# Patient Record
Sex: Female | Born: 1970 | Race: White | Hispanic: No | Marital: Married | Smoking: Never smoker
Health system: Southern US, Community
[De-identification: ages and names within clinical notes are randomized; demographics above are authoritative.]

## PROBLEM LIST (undated history)

## (undated) DIAGNOSIS — M313 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: Secondary | ICD-10-CM

## (undated) DIAGNOSIS — T7840XA Allergy, unspecified, initial encounter: Secondary | ICD-10-CM

## (undated) DIAGNOSIS — N3946 Mixed incontinence: Secondary | ICD-10-CM

## (undated) DIAGNOSIS — N8110 Cystocele, unspecified: Secondary | ICD-10-CM

## (undated) DIAGNOSIS — F329 Major depressive disorder, single episode, unspecified: Secondary | ICD-10-CM

## (undated) DIAGNOSIS — Z973 Presence of spectacles and contact lenses: Secondary | ICD-10-CM

## (undated) DIAGNOSIS — K219 Gastro-esophageal reflux disease without esophagitis: Secondary | ICD-10-CM

## (undated) DIAGNOSIS — C50919 Malignant neoplasm of unspecified site of unspecified female breast: Secondary | ICD-10-CM

## (undated) DIAGNOSIS — J45909 Unspecified asthma, uncomplicated: Secondary | ICD-10-CM

## (undated) DIAGNOSIS — F32A Depression, unspecified: Secondary | ICD-10-CM

## (undated) DIAGNOSIS — K449 Diaphragmatic hernia without obstruction or gangrene: Secondary | ICD-10-CM

## (undated) DIAGNOSIS — Z923 Personal history of irradiation: Secondary | ICD-10-CM

## (undated) DIAGNOSIS — F419 Anxiety disorder, unspecified: Secondary | ICD-10-CM

## (undated) DIAGNOSIS — F411 Generalized anxiety disorder: Secondary | ICD-10-CM

## (undated) HISTORY — DX: 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: M31.30

## (undated) HISTORY — DX: Allergy, unspecified, initial encounter: T78.40XA

## (undated) HISTORY — PX: RHINOPLASTY: SUR1284

## (undated) HISTORY — DX: Unspecified asthma, uncomplicated: J45.909

## (undated) HISTORY — DX: Anxiety disorder, unspecified: F41.9

## (undated) HISTORY — DX: Gastro-esophageal reflux disease without esophagitis: K21.9

## (undated) HISTORY — PX: BREAST LUMPECTOMY: SHX2

## (undated) HISTORY — PX: CARPAL TUNNEL RELEASE: SHX101

---

## 2004-04-14 ENCOUNTER — Emergency Department: Payer: Self-pay | Admitting: Unknown Physician Specialty

## 2004-10-28 ENCOUNTER — Other Ambulatory Visit: Admission: RE | Admit: 2004-10-28 | Discharge: 2004-10-28 | Payer: Self-pay | Admitting: Obstetrics and Gynecology

## 2005-01-15 ENCOUNTER — Emergency Department: Payer: Self-pay | Admitting: Emergency Medicine

## 2005-01-15 ENCOUNTER — Other Ambulatory Visit: Payer: Self-pay

## 2005-04-01 ENCOUNTER — Ambulatory Visit: Payer: Self-pay | Admitting: Internal Medicine

## 2005-08-24 ENCOUNTER — Ambulatory Visit: Payer: Self-pay | Admitting: Internal Medicine

## 2005-10-30 ENCOUNTER — Ambulatory Visit: Payer: Self-pay | Admitting: Internal Medicine

## 2005-12-22 ENCOUNTER — Ambulatory Visit: Payer: Self-pay | Admitting: Family Medicine

## 2006-01-28 ENCOUNTER — Other Ambulatory Visit: Admission: RE | Admit: 2006-01-28 | Discharge: 2006-01-28 | Payer: Self-pay | Admitting: Obstetrics and Gynecology

## 2006-04-26 ENCOUNTER — Ambulatory Visit: Payer: Self-pay | Admitting: Internal Medicine

## 2006-05-11 ENCOUNTER — Ambulatory Visit: Payer: Self-pay

## 2006-05-31 ENCOUNTER — Ambulatory Visit: Payer: Self-pay | Admitting: Internal Medicine

## 2006-09-13 ENCOUNTER — Encounter: Payer: Self-pay | Admitting: Internal Medicine

## 2006-09-13 ENCOUNTER — Ambulatory Visit: Payer: Self-pay | Admitting: Family Medicine

## 2006-09-13 LAB — CONVERTED CEMR LAB
Basophils Absolute: 0.1 10*3/uL (ref 0.0–0.1)
Basophils Relative: 0.5 % (ref 0.0–1.0)
Eosinophils Absolute: 0.1 10*3/uL (ref 0.0–0.6)
Eosinophils Relative: 1.2 % (ref 0.0–5.0)
HCT: 37.1 % (ref 36.0–46.0)
Hemoglobin: 12.9 g/dL (ref 12.0–15.0)
Lymphocytes Relative: 7.5 % — ABNORMAL LOW (ref 12.0–46.0)
MCHC: 34.8 g/dL (ref 30.0–36.0)
MCV: 92.9 fL (ref 78.0–100.0)
Monocytes Absolute: 0.7 10*3/uL (ref 0.2–0.7)
Monocytes Relative: 5.8 % (ref 3.0–11.0)
Neutro Abs: 10.2 10*3/uL — ABNORMAL HIGH (ref 1.4–7.7)
Neutrophils Relative %: 85 % — ABNORMAL HIGH (ref 43.0–77.0)
Platelets: 241 10*3/uL (ref 150–400)
RBC: 3.99 M/uL (ref 3.87–5.11)
RDW: 12.6 % (ref 11.5–14.6)
WBC: 12 10*3/uL — ABNORMAL HIGH (ref 4.5–10.5)

## 2006-09-14 ENCOUNTER — Emergency Department: Payer: Self-pay | Admitting: Emergency Medicine

## 2006-10-12 DIAGNOSIS — J45909 Unspecified asthma, uncomplicated: Secondary | ICD-10-CM | POA: Insufficient documentation

## 2006-10-12 DIAGNOSIS — J309 Allergic rhinitis, unspecified: Secondary | ICD-10-CM | POA: Insufficient documentation

## 2006-10-12 DIAGNOSIS — K219 Gastro-esophageal reflux disease without esophagitis: Secondary | ICD-10-CM | POA: Insufficient documentation

## 2006-10-13 ENCOUNTER — Ambulatory Visit: Payer: Self-pay | Admitting: Unknown Physician Specialty

## 2006-11-02 ENCOUNTER — Encounter: Payer: Self-pay | Admitting: Internal Medicine

## 2007-01-19 ENCOUNTER — Encounter (INDEPENDENT_AMBULATORY_CARE_PROVIDER_SITE_OTHER): Payer: Self-pay | Admitting: *Deleted

## 2007-01-24 ENCOUNTER — Encounter (INDEPENDENT_AMBULATORY_CARE_PROVIDER_SITE_OTHER): Payer: Self-pay | Admitting: *Deleted

## 2007-07-07 HISTORY — PX: BREAST SURGERY: SHX581

## 2008-01-09 ENCOUNTER — Ambulatory Visit: Payer: Self-pay | Admitting: Family Medicine

## 2008-01-21 ENCOUNTER — Ambulatory Visit: Payer: Self-pay | Admitting: Unknown Physician Specialty

## 2008-07-06 HISTORY — PX: AUGMENTATION MAMMAPLASTY: SUR837

## 2008-07-06 HISTORY — PX: ABDOMINAL HYSTERECTOMY: SHX81

## 2009-06-24 ENCOUNTER — Emergency Department: Payer: Self-pay | Admitting: Emergency Medicine

## 2010-02-21 ENCOUNTER — Ambulatory Visit (HOSPITAL_COMMUNITY): Admission: RE | Admit: 2010-02-21 | Discharge: 2010-02-22 | Payer: Self-pay | Admitting: Obstetrics and Gynecology

## 2010-02-21 ENCOUNTER — Encounter (INDEPENDENT_AMBULATORY_CARE_PROVIDER_SITE_OTHER): Payer: Self-pay | Admitting: Obstetrics and Gynecology

## 2010-08-05 NOTE — Letter (Signed)
Summary: [employer] No Show Letter  [place] at [facility]  [address] [place], [place] [address]   Phone: [phone]  Fax: [phone]    01/24/2007   Dear Ms. Azucena Kuba,   Our records indicate that you missed your scheduled appointment with _Dr. Ketvak____________________ on __7.21.2008__________.  Please contact this office to reschedule your appointment as soon as possible.  It is important that you keep your scheduled appointments with your physician, so we can provide you the best care possible.  Please be advised that there may be a charge for "no show" appointments.    Sincerely,   [place] at [facility]

## 2010-08-05 NOTE — Miscellaneous (Signed)
Summary: Appointment Canceled  Appointment status changed to canceled by [employer] on 11/02/2006 1:08 PM.  Cancellation Comments --------------------- CPX/EVE  Appointment Information ----------------------- Appt Type:  ADULT PHYSICAL      Date:  Tuesday, November 02, 2006      Time:  8:30 AM for 30 min   Urgency:  Routine   Made By:  Pearson Grippe  To Visit:  [id number]    Reason:  CPX/EVE  Appt Comments ------------- -- 11/02/06 13:08: (CEMR) CANCELED -- CPX/EVE -- 11/02/06 9:00: (CEMR) NO SHOW -- CPX/EVE -- 10/27/06 10:58: (CEMR) BOOKED -- Routine ADULT PHYSICAL at 11/02/2006 8:30 AM for 30 min CPX/EVE

## 2010-08-05 NOTE — Miscellaneous (Signed)
  Medications Added CITALOPRAM HYDROBROMIDE 40 MG TABS (CITALOPRAM HYDROBROMIDE)  CELEXA 40 MG  TABS (CITALOPRAM HYDROBROMIDE) 60 mg. once daily PRILOSEC 10 MG  CPDR (OMEPRAZOLE) OTC once daily ALLEGRA 180 MG  TABS (FEXOFENADINE HCL) once daily as needed      Allergies Added: ! MACRODANTIN (NITROFURANTOIN MACROCRYSTAL) ! ERY-TAB (ERYTHROMYCIN BASE) Clinical Lists Changes  Medications: Added new medication of CITALOPRAM HYDROBROMIDE 40 MG TABS (CITALOPRAM HYDROBROMIDE) Added new medication of CELEXA 40 MG  TABS (CITALOPRAM HYDROBROMIDE) 60 mg. once daily Added new medication of PRILOSEC 10 MG  CPDR (OMEPRAZOLE) OTC once daily Added new medication of ALLEGRA 180 MG  TABS (FEXOFENADINE HCL) once daily as needed Allergies: Added new allergy or adverse reaction of MACRODANTIN (NITROFURANTOIN MACROCRYSTAL) Added new allergy or adverse reaction of ERY-TAB (ERYTHROMYCIN BASE) Observations: Added new observation of NKA: F (01/19/2007 13:55) Added new observation of LLIMPORTALLS: completed (01/19/2007 13:55) Added new observation of LLIMPORTMEDS: completed (01/19/2007 13:55)

## 2010-09-19 LAB — CBC
HCT: 29.5 % — ABNORMAL LOW (ref 36.0–46.0)
HCT: 35.4 % — ABNORMAL LOW (ref 36.0–46.0)
Hemoglobin: 10.1 g/dL — ABNORMAL LOW (ref 12.0–15.0)
Hemoglobin: 12 g/dL (ref 12.0–15.0)
MCH: 30.2 pg (ref 26.0–34.0)
MCH: 30.7 pg (ref 26.0–34.0)
MCHC: 33.9 g/dL (ref 30.0–36.0)
MCHC: 34.2 g/dL (ref 30.0–36.0)
MCV: 89.2 fL (ref 78.0–100.0)
MCV: 89.9 fL (ref 78.0–100.0)
Platelets: 205 10*3/uL (ref 150–400)
Platelets: 226 10*3/uL (ref 150–400)
RBC: 3.28 MIL/uL — ABNORMAL LOW (ref 3.87–5.11)
RBC: 3.97 MIL/uL (ref 3.87–5.11)
RDW: 17.7 % — ABNORMAL HIGH (ref 11.5–15.5)
RDW: 17.7 % — ABNORMAL HIGH (ref 11.5–15.5)
WBC: 11.4 10*3/uL — ABNORMAL HIGH (ref 4.0–10.5)
WBC: 6.2 10*3/uL (ref 4.0–10.5)

## 2010-09-19 LAB — SURGICAL PCR SCREEN
MRSA, PCR: NEGATIVE
Staphylococcus aureus: NEGATIVE

## 2010-09-19 LAB — PREGNANCY, URINE: Preg Test, Ur: NEGATIVE

## 2011-07-28 ENCOUNTER — Ambulatory Visit: Payer: Self-pay | Admitting: Otolaryngology

## 2011-12-01 ENCOUNTER — Other Ambulatory Visit: Payer: Self-pay | Admitting: Obstetrics and Gynecology

## 2011-12-01 DIAGNOSIS — Z1231 Encounter for screening mammogram for malignant neoplasm of breast: Secondary | ICD-10-CM

## 2012-02-17 ENCOUNTER — Ambulatory Visit
Admission: RE | Admit: 2012-02-17 | Discharge: 2012-02-17 | Disposition: A | Discharge status: Home / Self Care | Payer: 59 | Source: Ambulatory Visit | Attending: Obstetrics and Gynecology | Admitting: Obstetrics and Gynecology

## 2012-02-17 ENCOUNTER — Ambulatory Visit: Payer: Self-pay | Admitting: Family Medicine

## 2012-02-17 DIAGNOSIS — Z1231 Encounter for screening mammogram for malignant neoplasm of breast: Secondary | ICD-10-CM

## 2013-10-05 ENCOUNTER — Ambulatory Visit: Payer: Self-pay | Admitting: Otolaryngology

## 2013-12-05 DIAGNOSIS — J3489 Other specified disorders of nose and nasal sinuses: Secondary | ICD-10-CM | POA: Insufficient documentation

## 2013-12-05 DIAGNOSIS — L309 Dermatitis, unspecified: Secondary | ICD-10-CM | POA: Insufficient documentation

## 2013-12-06 DIAGNOSIS — H905 Unspecified sensorineural hearing loss: Secondary | ICD-10-CM | POA: Insufficient documentation

## 2013-12-18 ENCOUNTER — Ambulatory Visit (INDEPENDENT_AMBULATORY_CARE_PROVIDER_SITE_OTHER): Payer: BC Managed Care – PPO | Admitting: Family Medicine

## 2013-12-18 VITALS — BP 138/92 | HR 96 | Temp 97.5°F | Resp 16 | Ht 63.75 in | Wt 175.8 lb

## 2013-12-18 DIAGNOSIS — IMO0001 Reserved for inherently not codable concepts without codable children: Secondary | ICD-10-CM

## 2013-12-18 DIAGNOSIS — F438 Other reactions to severe stress: Secondary | ICD-10-CM

## 2013-12-18 DIAGNOSIS — Z79899 Other long term (current) drug therapy: Secondary | ICD-10-CM

## 2013-12-18 DIAGNOSIS — R030 Elevated blood-pressure reading, without diagnosis of hypertension: Secondary | ICD-10-CM

## 2013-12-18 DIAGNOSIS — F4389 Other reactions to severe stress: Secondary | ICD-10-CM

## 2013-12-18 DIAGNOSIS — F430 Acute stress reaction: Secondary | ICD-10-CM

## 2013-12-18 DIAGNOSIS — M313 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: Secondary | ICD-10-CM

## 2013-12-18 LAB — POCT CBC
Granulocyte percent: 63 %G (ref 37–80)
HCT, POC: 39.3 % (ref 37.7–47.9)
Hemoglobin: 12.6 g/dL (ref 12.2–16.2)
Lymph, poc: 2.3 (ref 0.6–3.4)
MCH, POC: 29.8 pg (ref 27–31.2)
MCHC: 32.1 g/dL (ref 31.8–35.4)
MCV: 92.9 fL (ref 80–97)
MID (cbc): 0.4 (ref 0–0.9)
MPV: 8.4 fL (ref 0–99.8)
POC Granulocyte: 4.7 (ref 2–6.9)
POC LYMPH PERCENT: 31.4 %L (ref 10–50)
POC MID %: 5.6 %M (ref 0–12)
Platelet Count, POC: 299 10*3/uL (ref 142–424)
RBC: 4.23 M/uL (ref 4.04–5.48)
RDW, POC: 15.3 %
WBC: 7.4 10*3/uL (ref 4.6–10.2)

## 2013-12-18 LAB — POCT URINALYSIS DIPSTICK
Bilirubin, UA: NEGATIVE
Blood, UA: NEGATIVE
Glucose, UA: NEGATIVE
Ketones, UA: NEGATIVE
Leukocytes, UA: NEGATIVE
Nitrite, UA: NEGATIVE
Protein, UA: NEGATIVE
Spec Grav, UA: 1.01
Urobilinogen, UA: 0.2
pH, UA: 5.5

## 2013-12-18 LAB — POCT UA - MICROSCOPIC ONLY
Bacteria, U Microscopic: NEGATIVE
Casts, Ur, LPF, POC: NEGATIVE
Crystals, Ur, HPF, POC: NEGATIVE
Mucus, UA: NEGATIVE
RBC, urine, microscopic: NEGATIVE
WBC, Ur, HPF, POC: NEGATIVE
Yeast, UA: NEGATIVE

## 2013-12-18 LAB — POCT SEDIMENTATION RATE: POCT SED RATE: 19 mm/hr (ref 0–22)

## 2013-12-18 MED ORDER — HYDROCHLOROTHIAZIDE 25 MG PO TABS: 25.0000 mg | ORAL_TABLET | Freq: Every day | ORAL | Status: DC

## 2013-12-18 NOTE — Patient Instructions (Signed)
Managing Your High Blood Pressure Blood pressure is a measurement of how forceful your blood is pressing against the walls of the arteries. Arteries are muscular tubes within the circulatory system. Blood pressure does not stay the same. Blood pressure rises when you are active, excited, or nervous; and it lowers during sleep and relaxation. If the numbers measuring your blood pressure stay above normal most of the time, you are at risk for health problems. High blood pressure (hypertension) is a long-term (chronic) condition in which blood pressure is elevated. A blood pressure reading is recorded as two numbers, such as 120 over 80 (or 120/80). The first, higher number is called the systolic pressure. It is a measure of the pressure in your arteries as the heart beats. The second, lower number is called the diastolic pressure. It is a measure of the pressure in your arteries as the heart relaxes between beats.  Keeping your blood pressure in a normal range is important to your overall health and prevention of health problems, such as heart disease and stroke. When your blood pressure is uncontrolled, your heart has to work harder than normal. High blood pressure is a very common condition in adults because blood pressure tends to rise with age. Men and women are equally likely to have hypertension but at different times in life. Before age 45, men are more likely to have hypertension. After 44 years of age, women are more likely to have it. Hypertension is especially common in African Americans. This condition often has no signs or symptoms. The cause of the condition is usually not known. Your caregiver can help you come up with a plan to keep your blood pressure in a normal, healthy range. BLOOD PRESSURE STAGES Blood pressure is classified into four stages: normal, prehypertension, stage 1, and stage 2. Your blood pressure reading will be used to determine what type of treatment, if any, is necessary.  Appropriate treatment options are tied to these four stages:  Normal  Systolic pressure (mm Hg): below 120.  Diastolic pressure (mm Hg): below 80. Prehypertension  Systolic pressure (mm Hg): 120 to 139.  Diastolic pressure (mm Hg): 80 to 89. Stage1  Systolic pressure (mm Hg): 140 to 159.  Diastolic pressure (mm Hg): 90 to 99. Stage2  Systolic pressure (mm Hg): 160 or above.  Diastolic pressure (mm Hg): 100 or above. RISKS RELATED TO HIGH BLOOD PRESSURE Managing your blood pressure is an important responsibility. Uncontrolled high blood pressure can lead to:  A heart attack.  A stroke.  A weakened blood vessel (aneurysm).  Heart failure.  Kidney damage.  Eye damage.  Metabolic syndrome.  Memory and concentration problems. HOW TO MANAGE YOUR BLOOD PRESSURE Blood pressure can be managed effectively with lifestyle changes and medicines (if needed). Your caregiver will help you come up with a plan to bring your blood pressure within a normal range. Your plan should include the following: Education  Read all information provided by your caregivers about how to control blood pressure.  Educate yourself on the latest guidelines and treatment recommendations. New research is always being done to further define the risks and treatments for high blood pressure. Lifestylechanges  Control your weight.  Avoid smoking.  Stay physically active.  Reduce the amount of salt in your diet.  Reduce stress.  Control any chronic conditions, such as high cholesterol or diabetes.  Reduce your alcohol intake. Medicines  Several medicines (antihypertensive medicines) are available, if needed, to bring blood pressure within a normal range.  Communication  Review all the medicines you take with your caregiver because there may be side effects or interactions.  Talk with your caregiver about your diet, exercise habits, and other lifestyle factors that may be contributing to  high blood pressure.  See your caregiver regularly. Your caregiver can help you create and adjust your plan for managing high blood pressure. RECOMMENDATIONS FOR TREATMENT AND FOLLOW-UP  The following recommendations are based on current guidelines for managing high blood pressure in nonpregnant adults. Use these recommendations to identify the proper follow-up period or treatment option based on your blood pressure reading. You can discuss these options with your caregiver.  Systolic pressure of 378 to 588 or diastolic pressure of 80 to 89: Follow up with your caregiver as directed.  Systolic pressure of 502 to 774 or diastolic pressure of 90 to 100: Follow up with your caregiver within 2 months.  Systolic pressure above 128 or diastolic pressure above 786: Follow up with your caregiver within 1 month.  Systolic pressure above 767 or diastolic pressure above 209: Consider antihypertensive therapy; follow up with your caregiver within 1 week.  Systolic pressure above 470 or diastolic pressure above 962: Begin antihypertensive therapy; follow up with your caregiver within 1 week. Document Released: 03/16/2012 Document Reviewed: 03/16/2012 [facility] Patient Information 2014 [place], [place]. Arterial Hypertension Arterial hypertension (high blood pressure) is a condition of elevated pressure in your blood vessels. Hypertension over a long period of time is a risk factor for strokes, heart attacks, and heart failure. It is also the leading cause of kidney (renal) failure.  CAUSES   In Adults -- Over 90% of all hypertension has no known cause. This is called essential or primary hypertension. In the other 10% of people with hypertension, the increase in blood pressure is caused by another disorder. This is called secondary hypertension. Important causes of secondary hypertension are:  Heavy alcohol use.  Obstructive sleep apnea.  Hyperaldosterosim (Conn's syndrome).  Steroid use.  Chronic  kidney failure.  Hyperparathyroidism.  Medications.  Renal artery stenosis.  Pheochromocytoma.  Cushing's disease.  Coarctation of the aorta.  Scleroderma renal crisis.  Licorice (in excessive amounts).  Drugs (cocaine, methamphetamine). Your caregiver can explain any items above that apply to you.  In Children -- Secondary hypertension is more common and should always be considered.  Pregnancy -- Few women of childbearing age have high blood pressure. However, up to 10% of them develop hypertension of pregnancy. Generally, this will not harm the woman. It may be a sign of 3 complications of pregnancy: preeclampsia, HELLP syndrome, and eclampsia. Follow up and control with medication is necessary. SYMPTOMS   This condition normally does not produce any noticeable symptoms. It is usually found during a routine exam.  Malignant hypertension is a late problem of high blood pressure. It may have the following symptoms:  Headaches.  Blurred vision.  End-organ damage (this means your kidneys, heart, lungs, and other organs are being damaged).  Stressful situations can increase the blood pressure. If a person with normal blood pressure has their blood pressure go up while being seen by their caregiver, this is often termed "white coat hypertension." Its importance is not known. It may be related with eventually developing hypertension or complications of hypertension.  Hypertension is often confused with mental tension, stress, and anxiety. DIAGNOSIS  The diagnosis is made by 3 separate blood pressure measurements. They are taken at least 1 week apart from each other. If there is organ damage from hypertension, the  diagnosis may be made without repeat measurements. Hypertension is usually identified by having blood pressure readings:  Above 140/90 mmHg measured in both arms, at 3 separate times, over a couple weeks.  Over 130/80 mmHg should be considered a risk factor and may  require treatment in patients with diabetes. Blood pressure readings over 120/80 mmHg are called "pre-hypertension" even in non-diabetic patients. To get a true blood pressure measurement, use the following guidelines. Be aware of the factors that can alter blood pressure readings.  Take measurements at least 1 hour after caffeine.  Take measurements 30 minutes after smoking and without any stress. This is another reason to quit smoking  it raises your blood pressure.  Use a proper cuff size. Ask your caregiver if you are not sure about your cuff size.  Most home blood pressure cuffs are automatic. They will measure systolic and diastolic pressures. The systolic pressure is the pressure reading at the start of sounds. Diastolic pressure is the pressure at which the sounds disappear. If you are elderly, measure pressures in multiple postures. Try sitting, lying or standing.  Sit at rest for a minimum of 5 minutes before taking measurements.  You should not be on any medications like decongestants. These are found in many cold medications.  Record your blood pressure readings and review them with your caregiver. If you have hypertension:  Your caregiver may do tests to be sure you do not have secondary hypertension (see "causes" above).  Your caregiver may also look for signs of metabolic syndrome. This is also called Syndrome X or Insulin Resistance Syndrome. You may have this syndrome if you have type 2 diabetes, abdominal obesity, and abnormal blood lipids in addition to hypertension.  Your caregiver will take your medical and family history and perform a physical exam.  Diagnostic tests may include blood tests (for glucose, cholesterol, potassium, and kidney function), a urinalysis, or an EKG. Other tests may also be necessary depending on your condition. PREVENTION  There are important lifestyle issues that you can adopt to reduce your chance of developing hypertension:  Maintain a  normal weight.  Limit the amount of salt (sodium) in your diet.  Exercise often.  Limit alcohol intake.  Get enough potassium in your diet. Discuss specific advice with your caregiver.  Follow a DASH diet (dietary approaches to stop hypertension). This diet is rich in fruits, vegetables, and low-fat dairy products, and avoids certain fats. PROGNOSIS  Essential hypertension cannot be cured. Lifestyle changes and medical treatment can lower blood pressure and reduce complications. The prognosis of secondary hypertension depends on the underlying cause. Many people whose hypertension is controlled with medicine or lifestyle changes can live a normal, healthy life.  RISKS AND COMPLICATIONS  While high blood pressure alone is not an illness, it often requires treatment due to its short- and long-term effects on many organs. Hypertension increases your risk for:  CVAs or strokes (cerebrovascular accident).  Heart failure due to chronically high blood pressure (hypertensive cardiomyopathy).  Heart attack (myocardial infarction).  Damage to the retina (hypertensive retinopathy).  Kidney failure (hypertensive nephropathy). Your caregiver can explain list items above that apply to you. Treatment of hypertension can significantly reduce the risk of complications. TREATMENT   For overweight patients, weight loss and regular exercise are recommended. Physical fitness lowers blood pressure.  Mild hypertension is usually treated with diet and exercise. A diet rich in fruits and vegetables, fat-free dairy products, and foods low in fat and salt (sodium) can help lower  blood pressure. Decreasing salt intake decreases blood pressure in a 1/3 of people.  Stop smoking if you are a smoker. The steps above are highly effective in reducing blood pressure. While these actions are easy to suggest, they are difficult to achieve. Most patients with moderate or severe hypertension end up requiring medications  to bring their blood pressure down to a normal level. There are several classes of medications for treatment. Blood pressure pills (antihypertensives) will lower blood pressure by their different actions. Lowering the blood pressure by 10 mmHg may decrease the risk of complications by as much as 25%. The goal of treatment is effective blood pressure control. This will reduce your risk for complications. Your caregiver will help you determine the best treatment for you according to your lifestyle. What is excellent treatment for one person, may not be for you. HOME CARE INSTRUCTIONS   Do not smoke.  Follow the lifestyle changes outlined in the "Prevention" section.  If you are on medications, follow the directions carefully. Blood pressure medications must be taken as prescribed. Skipping doses reduces their benefit. It also puts you at risk for problems.  Follow up with your caregiver, as directed.  If you are asked to monitor your blood pressure at home, follow the guidelines in the "Diagnosis" section above. SEEK MEDICAL CARE IF:   You think you are having medication side effects.  You have recurrent headaches or lightheadedness.  You have swelling in your ankles.  You have trouble with your vision. SEEK IMMEDIATE MEDICAL CARE IF:   You have sudden onset of chest pain or pressure, difficulty breathing, or other symptoms of a heart attack.  You have a severe headache.  You have symptoms of a stroke (such as sudden weakness, difficulty speaking, difficulty walking). MAKE SURE YOU:   Understand these instructions.  Will watch your condition.  Will get help right away if you are not doing well or get worse. Document Released: 06/22/2005 Document Revised: 09/14/2011 Document Reviewed: 01/20/2007 [facility] Patient Information 2014 [place].

## 2013-12-18 NOTE — Progress Notes (Signed)
Subjective:    Patient ID: [name], female    DOB: 06-16-1971, 43 y.o.   MRN: [id number] This chart was scribed for Delman Cheadle, MD by Randa Evens, ED Scribe. This Patient was seen in room 11 and the patients care was started at 6:55 PM  Hypertension Associated symptoms include headaches.   Chief Complaint  Patient presents with  . Hypertension    pt stated that her BP has been elevated since last tuesday that she is aware.   c/o headaches and seeing spots in her right eye.     HPI Comments: [name] is a 43 y.o. female who presents to the [facility] complaining of hypertension that is higher than normal.  She states that 117/68 is baseline. States she was at 142/98 at EMT. She states that she was 142/96 last night. States she has been having associated headaches, and visual disturbance in the right eye. States she has been having floaters in her right eye. States she has checked all her labs and nothing returned abnormal. She denies any stressful situations her life right now. States she has a family h/o of hypertension. States she had a Ct scan last week for the headaches. She states she was diagnosed with sinus headaches over the winter. She states her last CT scan of her sinuses were clear. 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     12/05/13 labs: UA normal small amount red blood cells seen microscopy  TCH and free T4 were normal 0.75. ESR and CRP were normal, CBC norma, CMP normal  Past Medical History  Diagnosis Date  . Anxiety   . Asthma   . GERD (gastroesophageal reflux disease)   . Wegener's syndrome    Prior to Admission medications   Medication Sig Start Date End Date Taking? Authorizing Provider  cetirizine (ZYRTEC) 10 MG tablet Take 10 mg by mouth daily.   Yes Historical Provider, MD  dexlansoprazole (DEXILANT) 60 MG capsule Take 60 mg by mouth daily.   Yes Historical  Provider, MD  folic acid (FOLVITE) 1 MG tablet Take 1 mg by mouth daily.   Yes Historical Provider, MD  methotrexate (RHEUMATREX) 2.5 MG tablet Take 6 tablets once weekly.  Caution:Chemotherapy. Protect from light.   Yes Historical Provider, MD  montelukast (SINGULAIR) 10 MG tablet Take 10 mg by mouth at bedtime.   Yes Historical Provider, MD  venlafaxine (EFFEXOR) 50 MG tablet Take 50 mg by mouth daily.   Yes Historical Provider, MD   Allergies  Allergen Reactions  . Erythromycin Base   . Nitrofurantoin     REACTION: Nausea and vomiting    Review of Systems  Eyes: Positive for visual disturbance.  Neurological: Positive for headaches.  Psychiatric/Behavioral: Negative for sleep disturbance.    Objective:   BP 138/92  Pulse 96  Temp(Src) 97.5 F (36.4 C) (Oral)  Resp 16  Ht 5' 3.75" (1.619 m)  Wt 175 lb 12.8 oz (79.742 kg)  BMI 30.42 kg/m2  SpO2 99%    Physical Exam  Nursing note and vitals reviewed. Constitutional: She is oriented to person, place, and time. She appears well-developed and well-nourished. No distress.  HENT:  Head: Normocephalic and atraumatic.  Nose: Septal deviation present.  Septum very friable deviated with large hole  Eyes: Conjunctivae and EOM are normal.  funduscopic benign no silver lining or av nicking   Neck: Neck supple.  Cardiovascular: Normal rate, S1 normal, S2 normal and normal heart sounds.   No murmur heard. Pulmonary/Chest: Effort normal. No respiratory distress.  Musculoskeletal: Normal range of motion.  Neurological: She is alert and oriented to person, place, and time.  Skin: Skin is warm and dry.  Psychiatric: She has a normal mood and affect. Her behavior is normal.   EKG normal sinus rhythm flipped T's NV1 NV2  Assessment & Plan:   Wegener's syndrome - Plan: EKG 12-Lead, POCT CBC, POCT SEDIMENTATION RATE, POCT UA - Microscopic Only, POCT urinalysis dipstick, C-reactive protein, Comprehensive metabolic panel  Elevated BP  - Plan: EKG 12-Lead, POCT CBC, POCT SEDIMENTATION RATE, POCT UA - Microscopic Only, POCT urinalysis dipstick, C-reactive protein, Comprehensive metabolic panel  Stress disorder, acute  Encounter for long-term (current) use of other medications - Plan: Comprehensive metabolic panel  Meds ordered this encounter  Medications  . montelukast (SINGULAIR) 10 MG tablet    Sig: Take 10 mg by mouth at bedtime.  . cetirizine (ZYRTEC) 10 MG tablet    Sig: Take 10 mg by mouth daily.  . folic acid (FOLVITE) 1 MG tablet    Sig: Take 1 mg by mouth daily.  . methotrexate (RHEUMATREX) 2.5 MG tablet    Sig: Take 6 tablets once weekly.  Caution:Chemotherapy. Protect from light.  . dexlansoprazole (DEXILANT) 60 MG capsule    Sig: Take 60 mg by mouth daily.  Marland Kitchen DISCONTD: venlafaxine (EFFEXOR) 50 MG tablet    Sig: Take 50 mg by mouth daily.  . hydrochlorothiazide (HYDRODIURIL) 25 MG tablet    Sig: Take 1 tablet (25 mg total) by mouth daily.    Dispense:  90 tablet    Refill:  0    I personally performed the services described in this documentation, which was scribed in my presence. The recorded information has been reviewed and considered, and addended by me as needed.  Delman Cheadle, MD MPH   Results for orders placed in visit on 12/18/13  C-REACTIVE PROTEIN      Result Value Ref Range   CRP <0.5  <0.60 mg/dL  COMPREHENSIVE METABOLIC PANEL      Result Value Ref Range   Sodium 138  135 - 145 mEq/L   Potassium 4.3  3.5 - 5.3 mEq/L   Chloride 104  96 - 112 mEq/L   CO2 24  19 - 32 mEq/L   Glucose, Bld 94  70 - 99 mg/dL   BUN 12  6 - 23 mg/dL   Creat 0.61  0.50 - 1.10 mg/dL   Total Bilirubin 0.5  0.2 - 1.2 mg/dL   Alkaline Phosphatase 48  39 - 117 U/L   AST 15  0 - 37 U/L   ALT 10  0 - 35 U/L   Total Protein 6.2  6.0 - 8.3 g/dL   Albumin 3.9  3.5 - 5.2 g/dL   Calcium 9.3  8.4 - 10.5 mg/dL  POCT CBC      Result Value Ref Range   WBC 7.4  4.6 - 10.2 K/uL   Lymph, poc 2.3  0.6 - 3.4   POC LYMPH  PERCENT 31.4  10 - 50 %L   MID (cbc) 0.4  0 - 0.9   POC MID % 5.6  0 - 12 %M   POC Granulocyte 4.7  2 - 6.9   Granulocyte percent 63.0  37 - 80 %G   RBC 4.23  4.04 - 5.48 M/uL   Hemoglobin 12.6  12.2 - 16.2  g/dL   HCT, POC 39.3  37.7 - 47.9 %   MCV 92.9  80 - 97 fL   MCH, POC 29.8  27 - 31.2 pg   MCHC 32.1  31.8 - 35.4 g/dL   RDW, POC 15.3     Platelet Count, POC 299  142 - 424 K/uL   MPV 8.4  0 - 99.8 fL  POCT SEDIMENTATION RATE      Result Value Ref Range   POCT SED RATE 19  0 - 22 mm/hr  POCT UA - MICROSCOPIC ONLY      Result Value Ref Range   WBC, Ur, HPF, POC neg     RBC, urine, microscopic neg     Bacteria, U Microscopic neg     Mucus, UA neg     Epithelial cells, urine per micros 0-3     Crystals, Ur, HPF, POC neg     Casts, Ur, LPF, POC neg     Yeast, UA neg    POCT URINALYSIS DIPSTICK      Result Value Ref Range   Color, UA yellow     Clarity, UA clear     Glucose, UA neg     Bilirubin, UA neg     Ketones, UA neg     Spec Grav, UA 1.010     Blood, UA neg     pH, UA 5.5     Protein, UA neg     Urobilinogen, UA 0.2     Nitrite, UA neg     Leukocytes, UA Negative

## 2013-12-19 LAB — COMPREHENSIVE METABOLIC PANEL
ALT: 10 U/L (ref 0–35)
AST: 15 U/L (ref 0–37)
Albumin: 3.9 g/dL (ref 3.5–5.2)
Alkaline Phosphatase: 48 U/L (ref 39–117)
BUN: 12 mg/dL (ref 6–23)
CO2: 24 mEq/L (ref 19–32)
Calcium: 9.3 mg/dL (ref 8.4–10.5)
Chloride: 104 mEq/L (ref 96–112)
Creat: 0.61 mg/dL (ref 0.50–1.10)
Glucose, Bld: 94 mg/dL (ref 70–99)
Potassium: 4.3 mEq/L (ref 3.5–5.3)
Sodium: 138 mEq/L (ref 135–145)
Total Bilirubin: 0.5 mg/dL (ref 0.2–1.2)
Total Protein: 6.2 g/dL (ref 6.0–8.3)

## 2013-12-19 LAB — C-REACTIVE PROTEIN: CRP: <0.5 mg/dL (ref <0.60)

## 2013-12-27 NOTE — Progress Notes (Signed)
Left a message for patient to return call to schedule an appointment for Dr. Tamala Julian to reestablish care.

## 2014-01-02 ENCOUNTER — Emergency Department: Payer: Self-pay | Admitting: Emergency Medicine

## 2014-01-02 LAB — BASIC METABOLIC PANEL
Anion Gap: 15 (ref 7–16)
BUN: 14 mg/dL (ref 7–18)
Calcium, Total: 9.8 mg/dL (ref 8.5–10.1)
Chloride: 101 mmol/L (ref 98–107)
Co2: 23 mmol/L (ref 21–32)
Creatinine: 0.97 mg/dL (ref 0.60–1.30)
EGFR (African American): >60
EGFR (Non-African Amer.): >60
Glucose: 118 mg/dL — ABNORMAL HIGH (ref 65–99)
Osmolality: 279 (ref 275–301)
Potassium: 3.2 mmol/L — ABNORMAL LOW (ref 3.5–5.1)
Sodium: 139 mmol/L (ref 136–145)

## 2014-01-02 LAB — CBC
HCT: 40.7 % (ref 35.0–47.0)
HGB: 13.6 g/dL (ref 12.0–16.0)
MCH: 30.2 pg (ref 26.0–34.0)
MCHC: 33.5 g/dL (ref 32.0–36.0)
MCV: 90 fL (ref 80–100)
Platelet: 376 10*3/uL (ref 150–440)
RBC: 4.51 10*6/uL (ref 3.80–5.20)
RDW: 15.1 % — ABNORMAL HIGH (ref 11.5–14.5)
WBC: 13.6 10*3/uL — ABNORMAL HIGH (ref 3.6–11.0)

## 2014-01-02 LAB — TROPONIN I: Troponin-I: <0.02 ng/mL

## 2014-01-02 LAB — PRO B NATRIURETIC PEPTIDE: B-Type Natriuretic Peptide: 21 pg/mL (ref 0–125)

## 2014-01-02 LAB — D-DIMER(ARMC): D-Dimer: 315 ng/ml

## 2014-01-02 NOTE — Progress Notes (Signed)
Left a message for patient to call and schedule appointment with Dr. Tamala Julian to re establish care.

## 2014-01-04 ENCOUNTER — Encounter: Payer: Self-pay | Admitting: Family Medicine

## 2014-01-04 NOTE — Progress Notes (Signed)
Sent an unable to reach letter for patient to call and schedule appointment

## 2014-01-04 NOTE — Progress Notes (Signed)
Left a message for patient to return call to schedule appointment with Dr. Tamala Julian to reestablish care.

## 2014-01-10 ENCOUNTER — Encounter: Payer: Self-pay | Admitting: Family Medicine

## 2014-01-10 ENCOUNTER — Ambulatory Visit (INDEPENDENT_AMBULATORY_CARE_PROVIDER_SITE_OTHER): Payer: BC Managed Care – PPO | Admitting: Family Medicine

## 2014-01-10 VITALS — BP 117/70 | HR 97 | Temp 98.3°F | Resp 16 | Ht 63.0 in | Wt 168.6 lb

## 2014-01-10 DIAGNOSIS — K219 Gastro-esophageal reflux disease without esophagitis: Secondary | ICD-10-CM

## 2014-01-10 DIAGNOSIS — M313 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: Secondary | ICD-10-CM

## 2014-01-10 DIAGNOSIS — J45909 Unspecified asthma, uncomplicated: Secondary | ICD-10-CM

## 2014-01-10 DIAGNOSIS — F430 Acute stress reaction: Secondary | ICD-10-CM

## 2014-01-10 DIAGNOSIS — F411 Generalized anxiety disorder: Secondary | ICD-10-CM

## 2014-01-10 MED ORDER — VENLAFAXINE HCL ER 150 MG PO CP24: 150.0000 mg | ORAL_CAPSULE | Freq: Every day | ORAL | Status: DC

## 2014-01-10 MED ORDER — LORAZEPAM 0.5 MG PO TABS: 0.5000 mg | ORAL_TABLET | Freq: Every evening | ORAL | Status: DC | PRN

## 2014-01-10 NOTE — Progress Notes (Signed)
This chart was scribed for [name] Honour, MD by Elby Beck, Scribe. This patient was seen in room 21 and the patient's care was started at 9:38 AM.  Subjective:    Patient ID: [name], female    DOB: [birth year], 43 y.o.   MRN: [id number]  01/10/2014  Establish Care and Depression   HPI HPI Comments: [name] is a 43 y.o. female who presents to the [facility] to establish care. She states that her last physical examination was August 2014. She states that she has a pap smear scheduled for August 2015. She states that she had a normal mammogram last year, and she has an appointment for a mammogram in August 2015. She states that she had a colonoscopy several years ago, and she was told there was no need for follow-up until 15 years. She states that her Tdap is UTD. She states that she receives yearly flu vaccines. She states that she has had a pneumonia vaccination. She is followed by [facility] in [place]. She denies any history of glaucoma or cataracts. She is regularly followed by a Dentist, and states that she has no major dental issues.   She states that she has a history of anxiety, and she has had recent stressors. Her step-father has recently had an MI and a triple bypass. She also recently separated from her husband of 22 years. She is fighting for custody of her 63 and 3 year old children. She states that her husband is not paying child support. She states that her husband is making her life miserable. She states that her husband has accused her of having an affair, whereas she believes he was having an affair. She denies any SI and knows that she needs to be there for her children. She states that she has good support from her mother and another family member. She was on Zoloft in the past for anxiety and nightmares. She has also been on Celexa in the past. She has been on Effexor 75 mg once a day for the past year. She feels like she is  also scatter-brained and she may have ADD. She was recently given a prescription of 20 - 0.5 mg Lorazepam when seen in the ED.  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 She states that she has had rash/itching/peeling/bleeding on her upper extremities for the past 2 years which was finally diagnosed as Wegener's. She was started on Methotrexate about 1 month ago. She was seen here a couple weeks ago with elevated blood pressures and was started on HCTZ. She states that she discontinued this medication due to occasionally having lightheadedness and noticing her potassium was low. She states that she believes her elevated blood pressure was due to stress.  She states that she has a history of asthma. She is allergic to dogs, cats, molds and horses. She is also allergic to Nitrofurantoin and Erythromycin. She had a breast lift/augmentation surgery in 2009. She has a history of carpal tunnel release on the right. She had a partial hysterectomy in 2010. She had nasal surgery about 7 years ago after her child accidentally broke her nose. She states that her father is living, but she has no history on him. Her mother is 54 years old. 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 She  denies any history of smoking. She states that she is an occasional alcohol drinker.   She states that she was recently seen in the ED with breathing difficulty which was felt to be secondary to [facility] and stress. She states that she is still having a cough, productive of thick, clear sputum.  Review of Systems  Constitutional: Negative for fever, chills, diaphoresis and fatigue.  HENT: Negative for congestion, postnasal drip, rhinorrhea, sinus pressure, sore throat, trouble swallowing and voice change.   Respiratory: Positive for cough. Negative for shortness of breath,  wheezing and stridor.   Cardiovascular: Negative for chest pain, palpitations and leg swelling.  Gastrointestinal: Negative for nausea, vomiting, abdominal pain and diarrhea.  Skin: Positive for rash.  Neurological: Negative for dizziness, tremors, facial asymmetry, speech difficulty, weakness, light-headedness, numbness and headaches.  Psychiatric/Behavioral: Positive for sleep disturbance, dysphoric mood and decreased concentration. Negative for suicidal ideas, confusion and self-injury. The patient is nervous/anxious.     Past Medical History  Diagnosis Date   Anxiety    Asthma    GERD (gastroesophageal reflux disease)    Wegener's syndrome     [facility]   Allergy     s/p allergy testing; allergic to dogs, cats, molds, horses.   Past Surgical History  Procedure Laterality Date   Rhinoplasty      nasal fracture   Carpal tunnel release      R.  [name].   Breast surgery  07/07/2007    Augmentation   Abdominal hysterectomy  07/06/2008    CERVIX and OVARIES intact; DUB.    Allergies  Allergen Reactions   Erythromycin Base    Nitrofurantoin     REACTION: Nausea and vomiting   Current Outpatient Prescriptions  Medication Sig Dispense Refill   cetirizine (ZYRTEC) 10 MG tablet Take 10 mg by mouth daily.       dexlansoprazole (DEXILANT) 60 MG capsule Take 60 mg by mouth daily.       fluticasone-salmeterol (ADVAIR HFA) 115-21 MCG/ACT inhaler Inhale 2 puffs into the lungs 2 (two) times daily.       folic acid (FOLVITE) 1 MG tablet Take 1 mg by mouth daily.       gentamicin ointment (GARAMYCIN) 0.1 % Apply 1 application topically 2 (two) times daily.       methotrexate (RHEUMATREX) 2.5 MG tablet Take 6 tablets once weekly.  Caution:Chemotherapy. Protect from light.       montelukast (SINGULAIR) 10 MG tablet Take 10 mg by mouth at bedtime.       hydrochlorothiazide (HYDRODIURIL) 25 MG tablet Take 1 tablet (25 mg total) by mouth daily.   90 tablet  0   LORazepam (ATIVAN) 0.5 MG tablet Take 1 tablet (0.5 mg total) by mouth at bedtime as needed for anxiety.  30 tablet  1   venlafaxine XR (EFFEXOR-XR) 150 MG 24 hr capsule Take 1 capsule (150 mg total) by mouth daily with breakfast.  30 capsule  5   No current facility-administered medications for this visit.   History   Social History   Marital Status: Legally Separated    Spouse Name: N/A    Number of Children: N/A   Years of Education: N/A   Occupational History   Not on file.   Social History Main Topics   Smoking status: Never Smoker    Smokeless tobacco: Never Used   Alcohol Use: No   Drug Use: No   Sexual Activity: Not on file   Other Topics  Concern   Not on file   Social History Narrative   Marital status: separated in 11/2013; married x 22 years.      Children:  2 children (12, 15).      Lives: with 2 children.      Employment:  Personal assistant; working at hospital at [employer].      Tobacco:  None      Alcohol: socially      Exercise: none   Family History  Problem Relation Age of Onset   Asthma Mother    Depression Mother    Diabetes Mother    Hyperlipidemia Mother    Hypertension Mother    Mental illness Mother         Objective:    BP 117/70   Pulse 97   Temp(Src) 98.3 F (36.8 C) (Oral)   Resp 16   Ht 5\' 3"  (1.6 m)   Wt 168 lb 9.6 oz (76.476 kg)   BMI 29.87 kg/m2   SpO2 98% Physical Exam  Nursing note and vitals reviewed. Constitutional: She is oriented to person, place, and time. She appears well-developed and well-nourished. No distress.  HENT:  Head: Normocephalic and atraumatic.  Right Ear: External ear normal.  Left Ear: External ear normal.  Nose: Nose normal.  Mouth/Throat: Oropharynx is clear and moist.  Eyes: Conjunctivae and EOM are normal. Pupils are equal, round, and reactive to light.  Neck: Normal range of motion. Neck supple. Carotid bruit is not present. No  tracheal deviation present. No thyromegaly present.  Cardiovascular: Normal rate, regular rhythm, normal heart sounds and intact distal pulses.  Exam reveals no gallop and no friction rub.   No murmur heard. Pulmonary/Chest: Effort normal and breath sounds normal. No respiratory distress. She has no wheezes. She has no rales.  Abdominal: Soft. Bowel sounds are normal. She exhibits no distension and no mass. There is no tenderness. There is no rebound and no guarding.  Musculoskeletal: Normal range of motion.  Lymphadenopathy:    She has no cervical adenopathy.  Neurological: She is alert and oriented to person, place, and time. No cranial nerve deficit.  Skin: Skin is warm and dry. No rash noted. She is not diaphoretic. No erythema. No pallor.  Psychiatric: She has a normal mood and affect. Her behavior is normal.  Tearful.   Results for orders placed in visit on 12/18/13  C-REACTIVE PROTEIN      Result Value Ref Range   CRP <0.5  <0.60 mg/dL  COMPREHENSIVE METABOLIC PANEL      Result Value Ref Range   Sodium 138  135 - 145 mEq/L   Potassium 4.3  3.5 - 5.3 mEq/L   Chloride 104  96 - 112 mEq/L   CO2 24  19 - 32 mEq/L   Glucose, Bld 94  70 - 99 mg/dL   BUN 12  6 - 23 mg/dL   Creat 0.61  0.50 - 1.10 mg/dL   Total Bilirubin 0.5  0.2 - 1.2 mg/dL   Alkaline Phosphatase 48  39 - 117 U/L   AST 15  0 - 37 U/L   ALT 10  0 - 35 U/L   Total Protein 6.2  6.0 - 8.3 g/dL   Albumin 3.9  3.5 - 5.2 g/dL   Calcium 9.3  8.4 - 10.5 mg/dL  POCT CBC      Result Value Ref Range   WBC 7.4  4.6 - 10.2 K/uL   Lymph, poc  2.3  0.6 - 3.4   POC LYMPH PERCENT 31.4  10 - 50 %L   MID (cbc) 0.4  0 - 0.9   POC MID % 5.6  0 - 12 %M   POC Granulocyte 4.7  2 - 6.9   Granulocyte percent 63.0  37 - 80 %G   RBC 4.23  4.04 - 5.48 M/uL   Hemoglobin 12.6  12.2 - 16.2 g/dL   HCT, POC 39.3  37.7 - 47.9 %   MCV 92.9  80 - 97 fL   MCH, POC 29.8  27 - 31.2 pg   MCHC 32.1  31.8 - 35.4 g/dL   RDW, POC 15.3     Platelet  Count, POC 299  142 - 424 K/uL   MPV 8.4  0 - 99.8 fL  POCT SEDIMENTATION RATE      Result Value Ref Range   POCT SED RATE 19  0 - 22 mm/hr  POCT UA - MICROSCOPIC ONLY      Result Value Ref Range   WBC, Ur, HPF, POC neg     RBC, urine, microscopic neg     Bacteria, U Microscopic neg     Mucus, UA neg     Epithelial cells, urine per micros 0-3     Crystals, Ur, HPF, POC neg     Casts, Ur, LPF, POC neg     Yeast, UA neg    POCT URINALYSIS DIPSTICK      Result Value Ref Range   Color, UA yellow     Clarity, UA clear     Glucose, UA neg     Bilirubin, UA neg     Ketones, UA neg     Spec Grav, UA 1.010     Blood, UA neg     pH, UA 5.5     Protein, UA neg     Urobilinogen, UA 0.2     Nitrite, UA neg     Leukocytes, UA Negative         Assessment & Plan:  Generalized anxiety disorder - Plan: Ambulatory referral to Psychology  Gastroesophageal reflux disease, esophagitis presence not specified  ASTHMA  Wegener's syndrome  Stress reaction   1. Stress reaction: New. Dealing with stressor from recent separation from husband.  Counseling provided; refer for psychotherapy with Ellin Saba in [place]. Increase Effexor to 150mg  daily; rx for Lorazepam to use qhs PRN.  Close follow-up in one month. 2.  GERD: controlled with PPI daily. 3.  Asthma: controlled; no changes to therapy. 4.  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  Recently established with [facility]; followed closely by [name] of ENT. 5.  Anxiety with depression: worsening due to stress related to marriage.  Increase Effexor dose.  Meds ordered this encounter  Medications   fluticasone-salmeterol (ADVAIR HFA) 115-21 MCG/ACT inhaler    Sig: Inhale 2 puffs into the lungs 2 (two) times daily.   gentamicin ointment (GARAMYCIN) 0.1 %    Sig: Apply 1 application topically 2 (two) times daily.   venlafaxine XR  (EFFEXOR-XR) 150 MG 24 hr capsule    Sig: Take 1 capsule (150 mg total) by mouth daily with breakfast.    Dispense:  30 capsule    Refill:  5   LORazepam (ATIVAN) 0.5 MG tablet    Sig: Take 1 tablet (0.5 mg total) by mouth at bedtime as needed for anxiety.  Dispense:  30 tablet    Refill:  1    Return in about 1 month (around 02/10/2014) for recheck anxiety/stress reaction.   I personally performed the services described in this documentation, which was scribed in my presence.  The recorded information has been reviewed and is accurate.  [name], M.D.  [facility] [address] [place],   [address] [phone] phone [phone] fax

## 2014-02-07 ENCOUNTER — Encounter: Payer: Self-pay | Admitting: Family Medicine

## 2014-02-07 ENCOUNTER — Ambulatory Visit (INDEPENDENT_AMBULATORY_CARE_PROVIDER_SITE_OTHER): Payer: BC Managed Care – PPO | Admitting: Family Medicine

## 2014-02-07 VITALS — BP 110/84 | HR 79 | Temp 97.9°F | Resp 16 | Ht 63.0 in | Wt 165.0 lb

## 2014-02-07 DIAGNOSIS — G4700 Insomnia, unspecified: Secondary | ICD-10-CM | POA: Insufficient documentation

## 2014-02-07 DIAGNOSIS — F411 Generalized anxiety disorder: Secondary | ICD-10-CM | POA: Insufficient documentation

## 2014-02-07 DIAGNOSIS — M313 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: Secondary | ICD-10-CM | POA: Insufficient documentation

## 2014-02-07 MED ORDER — VENLAFAXINE HCL ER 75 MG PO CP24: 225.0000 mg | ORAL_CAPSULE | Freq: Every day | ORAL | Status: DC

## 2014-02-07 NOTE — Progress Notes (Signed)
Subjective:    Patient ID: [name], female    DOB: Nov 04, 1970, 43 y.o.   MRN: [id number]  02/07/2014  Follow-up and Anxiety   HPI This 43 y.o. female presents for one month follow-up of anxiety.  Management made at last visit included increasing Effexor to 150mg  daily; advised to take Lorazepam PRN.  Recommended counseling with [name]; underwent consultation with [name] x 1 visit; [name] is semi-retiring; no follow-up recommended; no referral to another therapist. Cannot tolerate Ativan due to insomnia.  Suffers with horrible insomnia on Lorazepam; has stopped medication and now sleeping much better.  Sleeping well otherwise.  Improvement in concentration with increased dose of Effexor.  Evenings are bad; pt notices that she emotionally crashes at 3:00pm; has thought due to stress of the day; took one of sister's 75mg  of Effexor; .  Has Ambien remaining and uses PRN.  Returns to school on 02/20/14.  SI over the weekend transiently; no plan.  Husband has a girlfriend two months after separation.  Wegeners:  Follow-up with rheumatology in October.  Rash on arms is stable. Compliance with MTX.  Review of Systems  Constitutional: Negative for fever, chills, diaphoresis and fatigue.  Respiratory: Negative for shortness of breath.   Neurological: Negative for dizziness, tremors, seizures, syncope, facial asymmetry, speech difficulty, weakness, light-headedness, numbness and headaches.  Psychiatric/Behavioral: Positive for suicidal ideas, dysphoric mood and decreased concentration. Negative for sleep disturbance and self-injury. The patient is nervous/anxious.     Past Medical History  Diagnosis Date  . Anxiety   . Asthma   . GERD (gastroesophageal reflux disease)   . Wegener's syndrome     [facility]/[name]  . Allergy     s/p allergy testing; allergic to dogs, cats, molds, horses.   Past Surgical History  Procedure Laterality Date  . Rhinoplasty      nasal  fracture  . Carpal tunnel release      R.  [name]  . Breast surgery  07/07/2007    Augmentation  . Abdominal hysterectomy  07/06/2008    CERVIX and OVARIES intact; DUB.   Allergies  Allergen Reactions  . Erythromycin Base   . Nitrofurantoin     REACTION: Nausea and vomiting   Current Outpatient Prescriptions  Medication Sig Dispense Refill  . cetirizine (ZYRTEC) 10 MG tablet Take 10 mg by mouth daily.      Marland Kitchen dexlansoprazole (DEXILANT) 60 MG capsule Take 60 mg by mouth daily.      . fluticasone-salmeterol (ADVAIR HFA) 115-21 MCG/ACT inhaler Inhale 2 puffs into the lungs 2 (two) times daily.      . folic acid (FOLVITE) 1 MG tablet Take 1 mg by mouth daily.      Marland Kitchen gentamicin ointment (GARAMYCIN) 0.1 % Apply 1 application topically 2 (two) times daily.      . methotrexate (RHEUMATREX) 2.5 MG tablet Take 6 tablets once weekly.  Caution:Chemotherapy. Protect from light.      . montelukast (SINGULAIR) 10 MG tablet Take 10 mg by mouth at bedtime.      Marland Kitchen venlafaxine XR (EFFEXOR-XR) 75 MG 24 hr capsule Take 3 capsules (225 mg total) by mouth daily with breakfast.  90 capsule  5  . hydrochlorothiazide (HYDRODIURIL) 25 MG tablet Take 1 tablet (25 mg total) by mouth daily.  90 tablet  0  . LORazepam (ATIVAN) 0.5 MG tablet Take 1 tablet (0.5 mg total) by mouth at bedtime as needed for anxiety.  30 tablet  1  No current facility-administered medications for this visit.       Objective:    BP 110/84  Pulse 79  Temp(Src) 97.9 F (36.6 C) (Oral)  Resp 16  Ht 5\' 3"  (1.6 m)  Wt 165 lb (74.844 kg)  BMI 29.24 kg/m2  SpO2 99% Physical Exam  Nursing note and vitals reviewed. Constitutional: She is oriented to person, place, and time. She appears well-developed and well-nourished. No distress.  HENT:  Head: Normocephalic and atraumatic.  Eyes: Conjunctivae are normal. Pupils are equal, round, and reactive to light.  Neck: Normal range of motion. Neck supple.  Cardiovascular: Normal rate,  regular rhythm and normal heart sounds.  Exam reveals no gallop and no friction rub.   No murmur heard. Pulmonary/Chest: Effort normal and breath sounds normal. She has no wheezes. She has no rales.  Neurological: She is alert and oriented to person, place, and time. No cranial nerve deficit. Coordination normal.  Skin: She is not diaphoretic.  Psychiatric: She has a normal mood and affect. Her behavior is normal. Judgment and thought content normal.        Assessment & Plan:   1. Generalized anxiety disorder   2. Wegener's disease, pulmonary   3. Insomnia    1.  Generalized anxiety disorder: improving with increase in Effexor dose.  Will increase Effexor EX to 150mg  every morning and 75mg  every afternoon. Stop Lorazepam due to side effects.  Will contact pt with name of another therapist.  Follow-up in one month. 2.  Insomnia: improved.  Did not tolerate Ativan. 3.  Wegener's disease:  Stable; appointment with rheumatology in October 2015; compliance with MTX.    Counseling provided during visit for 25 minutes with 50% of time committed to counseling, coordination of care.   Meds ordered this encounter  Medications  . venlafaxine XR (EFFEXOR-XR) 75 MG 24 hr capsule    Sig: Take 3 capsules (225 mg total) by mouth daily with breakfast.    Dispense:  90 capsule    Refill:  5    Return in about 4 weeks (around 03/07/2014) for recheck anxiety.    [name], M.D.  [facility] [address] [place], [place]  [address] [phone] phone [phone] fax

## 2014-02-07 NOTE — Addendum Note (Signed)
Addended by: [name] on: 02/07/2014 09:42 AM   Modules accepted: Orders

## 2014-03-12 ENCOUNTER — Ambulatory Visit: Payer: BC Managed Care – PPO | Admitting: Family Medicine

## 2014-04-10 DIAGNOSIS — R768 Other specified abnormal immunological findings in serum: Secondary | ICD-10-CM | POA: Insufficient documentation

## 2014-05-03 ENCOUNTER — Emergency Department (HOSPITAL_COMMUNITY)
Admission: EM | Admit: 2014-05-03 | Discharge: 2014-05-04 | Disposition: A | Discharge status: Other Acute Inpatient Hospital | Payer: 59 | Attending: Emergency Medicine | Admitting: Emergency Medicine

## 2014-05-03 ENCOUNTER — Ambulatory Visit (INDEPENDENT_AMBULATORY_CARE_PROVIDER_SITE_OTHER): Payer: BC Managed Care – PPO | Admitting: Family Medicine

## 2014-05-03 ENCOUNTER — Encounter (HOSPITAL_COMMUNITY): Payer: Self-pay | Admitting: *Deleted

## 2014-05-03 VITALS — BP 116/72 | HR 117 | Temp 98.1°F | Resp 18 | Ht 63.0 in | Wt 159.0 lb

## 2014-05-03 DIAGNOSIS — F323 Major depressive disorder, single episode, severe with psychotic features: Secondary | ICD-10-CM | POA: Insufficient documentation

## 2014-05-03 DIAGNOSIS — K219 Gastro-esophageal reflux disease without esophagitis: Secondary | ICD-10-CM | POA: Diagnosis not present

## 2014-05-03 DIAGNOSIS — J45909 Unspecified asthma, uncomplicated: Secondary | ICD-10-CM | POA: Insufficient documentation

## 2014-05-03 DIAGNOSIS — Z8739 Personal history of other diseases of the musculoskeletal system and connective tissue: Secondary | ICD-10-CM | POA: Diagnosis not present

## 2014-05-03 DIAGNOSIS — Z7952 Long term (current) use of systemic steroids: Secondary | ICD-10-CM | POA: Diagnosis not present

## 2014-05-03 DIAGNOSIS — Z7951 Long term (current) use of inhaled steroids: Secondary | ICD-10-CM | POA: Insufficient documentation

## 2014-05-03 DIAGNOSIS — F333 Major depressive disorder, recurrent, severe with psychotic symptoms: Secondary | ICD-10-CM | POA: Diagnosis present

## 2014-05-03 DIAGNOSIS — F411 Generalized anxiety disorder: Secondary | ICD-10-CM | POA: Diagnosis not present

## 2014-05-03 DIAGNOSIS — Z79899 Other long term (current) drug therapy: Secondary | ICD-10-CM | POA: Diagnosis not present

## 2014-05-03 DIAGNOSIS — F332 Major depressive disorder, recurrent severe without psychotic features: Secondary | ICD-10-CM | POA: Diagnosis not present

## 2014-05-03 DIAGNOSIS — R45851 Suicidal ideations: Secondary | ICD-10-CM

## 2014-05-03 DIAGNOSIS — F322 Major depressive disorder, single episode, severe without psychotic features: Secondary | ICD-10-CM

## 2014-05-03 HISTORY — DX: Major depressive disorder, single episode, unspecified: F32.9

## 2014-05-03 HISTORY — DX: Depression, unspecified: F32.A

## 2014-05-03 LAB — COMPREHENSIVE METABOLIC PANEL
ALT: 12 U/L (ref 0–35)
AST: 22 U/L (ref 0–37)
Albumin: 3.5 g/dL (ref 3.5–5.2)
Alkaline Phosphatase: 64 U/L (ref 39–117)
Anion gap: 12 (ref 5–15)
BUN: 12 mg/dL (ref 6–23)
CO2: 24 mEq/L (ref 19–32)
Calcium: 9.1 mg/dL (ref 8.4–10.5)
Chloride: 103 mEq/L (ref 96–112)
Creatinine, Ser: 0.66 mg/dL (ref 0.50–1.10)
GFR calc Af Amer: >90 mL/min (ref >90)
GFR calc non Af Amer: >90 mL/min (ref >90)
Glucose, Bld: 94 mg/dL (ref 70–99)
Potassium: 4.1 mEq/L (ref 3.7–5.3)
Sodium: 139 mEq/L (ref 137–147)
Total Bilirubin: 0.4 mg/dL (ref 0.3–1.2)
Total Protein: 6.6 g/dL (ref 6.0–8.3)

## 2014-05-03 LAB — CBC
HCT: 36.4 % (ref 36.0–46.0)
Hemoglobin: 12.1 g/dL (ref 12.0–15.0)
MCH: 30.9 pg (ref 26.0–34.0)
MCHC: 33.2 g/dL (ref 30.0–36.0)
MCV: 93.1 fL (ref 78.0–100.0)
Platelets: 340 10*3/uL (ref 150–400)
RBC: 3.91 MIL/uL (ref 3.87–5.11)
RDW: 16.6 % — ABNORMAL HIGH (ref 11.5–15.5)
WBC: 9.8 10*3/uL (ref 4.0–10.5)

## 2014-05-03 LAB — SALICYLATE LEVEL: Salicylate Lvl: <2 mg/dL — ABNORMAL LOW (ref 2.8–20.0)

## 2014-05-03 LAB — RAPID URINE DRUG SCREEN, HOSP PERFORMED
Amphetamines: NOT DETECTED
Barbiturates: NOT DETECTED
Benzodiazepines: NOT DETECTED
Cocaine: NOT DETECTED
Opiates: NOT DETECTED
Tetrahydrocannabinol: NOT DETECTED

## 2014-05-03 LAB — ACETAMINOPHEN LEVEL: Acetaminophen (Tylenol), Serum: <15 ug/mL (ref 10–30)

## 2014-05-03 LAB — ETHANOL: Alcohol, Ethyl (B): <11 mg/dL (ref 0–11)

## 2014-05-03 MED ORDER — ONDANSETRON HCL 4 MG PO TABS: 4.0000 mg | ORAL_TABLET | Freq: Three times a day (TID) | ORAL | Status: DC | PRN

## 2014-05-03 MED ORDER — ACETAMINOPHEN 325 MG PO TABS: 650.0000 mg | ORAL_TABLET | ORAL | Status: DC | PRN

## 2014-05-03 MED ORDER — ZOLPIDEM TARTRATE 5 MG PO TABS
5.0000 mg | ORAL_TABLET | Freq: Every evening | ORAL | Status: DC | PRN
  Administered 2014-05-03: 5 mg via ORAL
  Filled 2014-05-03: qty 1

## 2014-05-03 MED ORDER — LORAZEPAM 1 MG PO TABS: 1.0000 mg | ORAL_TABLET | Freq: Three times a day (TID) | ORAL | Status: DC | PRN

## 2014-05-03 MED ORDER — ALUM & MAG HYDROXIDE-SIMETH 200-200-20 MG/5ML PO SUSP: 30.0000 mL | ORAL | Status: DC | PRN

## 2014-05-03 MED ORDER — IBUPROFEN 200 MG PO TABS
600.0000 mg | ORAL_TABLET | Freq: Three times a day (TID) | ORAL | Status: DC | PRN
  Administered 2014-05-03: 600 mg via ORAL
  Filled 2014-05-03: qty 3

## 2014-05-03 NOTE — ED Notes (Signed)
Visitor at bedside.

## 2014-05-03 NOTE — Patient Instructions (Signed)
PRESENT TO [facility] EMERGENCY DEPARTMENT TO UNDERGO A FULL EVALUATION BY BEHAVIORAL HEALTH PROVIDER.

## 2014-05-03 NOTE — Progress Notes (Signed)
Subjective:    Patient ID: [name], female    DOB: March 24, 1971, 43 y.o.   MRN: [id number]  05/03/2014  Follow-up   HPI This 43 y.o. female presents for evaluation of worsening depression with suicidal ideations.  Last evaluated in 02/2014; doing better emotionally at that time; currently going through nasty separation from husband.  Then, in late September two daughters requested joint custody.  This decision devastated patient; daughters report that father's home feels more like a family environment.  Father is dating someone and the girls have fun when at his home.  Now, patient feels like a failure as a mother; she cannot provide a full family environment for her daughters. Since this time, her mood has drastically declined.  She is crying daily.  She is crying frequently at work; she can barely make it through one class without crying.  Work required her to start counseling in the past two weeks due to declining work performance.  She has lost motivation; she has no energy; she is sleeping a lot. She feels like a failure and feels unable to improve mood. She admits to frequent suicidal ideations. She has a plan of committing suicide by gunshot wound.  She does not currently have a gun in the home, but has asked for one from her ex-husband who is in [employer] organ[employer]; he refused to provide her with a gun. She feels horrible that her daughters no longer want to spend time with her because she is always sad.  She feels horrible for letting down her family including her mother, daughters, sister.  Compliance with Effexor.  She has been eating Lorazepam like candy for the past two weeks to calm her down.  EAP through work; emergency counseling session/[employer].  Girls going to counseling.  Will pay for first three sessions; next appointment girls with patient; 05/15/14.    Review of Systems  Constitutional: Negative for fever, chills, diaphoresis, fatigue and unexpected weight  change.  Psychiatric/Behavioral: Positive for suicidal ideas, dysphoric mood and decreased concentration. Negative for sleep disturbance and self-injury. The patient is nervous/anxious.     Past Medical History  Diagnosis Date  . Anxiety   . Asthma   . GERD (gastroesophageal reflux disease)   . 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 Allergy     s/p allergy testing; allergic to dogs, cats, molds, horses.   Past Surgical History  Procedure Laterality Date  . Rhinoplasty      nasal fracture  . Carpal tunnel release      R.  [name].  . Breast surgery  07/07/2007    Augmentation  . Abdominal hysterectomy  07/06/2008    CERVIX and OVARIES intact; DUB.   Allergies  Allergen Reactions  . Erythromycin Base   . Nitrofurantoin     REACTION: Nausea and vomiting   Current Outpatient Prescriptions  Medication Sig Dispense Refill  . dexlansoprazole (DEXILANT) 60 MG capsule Take 60 mg by mouth daily.      . fluticasone-salmeterol (ADVAIR HFA) 115-21 MCG/ACT inhaler Inhale 2 puffs into the lungs 2 (two) times daily.      . folic acid (FOLVITE) 1 MG tablet Take 1 mg by mouth daily.      Marland Kitchen LORazepam (ATIVAN) 0.5 MG tablet Take 0.5 mg by mouth every 8 (eight) hours.      . methotrexate (RHEUMATREX) 2.5 MG tablet Take 20 mg by mouth once a week. Take 6 tablets once weekly.  Caution:Chemotherapy. Protect from light.      . montelukast (SINGULAIR) 10 MG tablet Take 10 mg by mouth at bedtime.      . predniSONE (DELTASONE) 5 MG tablet Take 5 mg by mouth daily with breakfast.      . venlafaxine XR (EFFEXOR-XR) 150 MG 24 hr capsule Take 150 mg by mouth daily with breakfast. Takes 150 mg in the morning and if needed 75 mg in the afternoon      . venlafaxine XR (EFFEXOR-XR) 75 MG 24 hr capsule Take 3 capsules (225 mg total) by mouth daily with breakfast.  90 capsule  5  . cetirizine (ZYRTEC) 10 MG tablet Take 10 mg by mouth daily.      Marland Kitchen gentamicin ointment (GARAMYCIN) 0.1 %  Apply 1 application topically 2 (two) times daily.       No current facility-administered medications for this visit.   History   Social History  . Marital Status: Legally Separated    Spouse Name: N/A    Number of Children: N/A  . Years of Education: N/A   Occupational History  . Not on file.   Social History Main Topics  . Smoking status: Never Smoker   . Smokeless tobacco: Never Used  . Alcohol Use: No  . Drug Use: No  . Sexual Activity: Not on file   Other Topics Concern  . Not on file   Social History Narrative   Marital status: separated in 11/2013; married x 22 years.      Children:  2 children (12, 15).      Lives: with 2 children.      Employment:  Personal assistant; working at hospital at [employer].      Tobacco:  None      Alcohol: socially      Exercise: none        Objective:    BP 116/72  Pulse 117  Temp(Src) 98.1 F (36.7 C) (Oral)  Resp 18  Ht 5\' 3"  (1.6 m)  Wt 159 lb (72.122 kg)  BMI 28.17 kg/m2  SpO2 97% Physical Exam  Constitutional: She is oriented to person, place, and time. She appears well-developed and well-nourished. No distress.  HENT:  Head: Normocephalic and atraumatic.  Eyes: Conjunctivae and EOM are normal. Pupils are equal, round, and reactive to light.  Neck: Normal range of motion. Neck supple. Carotid bruit is not present. No thyromegaly present.  Cardiovascular: Normal rate, regular rhythm and normal heart sounds.  Exam reveals no gallop and no friction rub.   No murmur heard. Pulmonary/Chest: Effort normal and breath sounds normal. She has no wheezes. She has no rales.  Lymphadenopathy:    She has no cervical adenopathy.  Neurological: She is alert and oriented to person, place, and time. No cranial nerve deficit.  Skin: Skin is warm and dry. No rash noted. She is not diaphoretic. No erythema. No pallor.  Psychiatric: She has a normal mood and affect. Her behavior is normal.  Tearful  throughout entire exam.  Poor eye contact.        Assessment & Plan:   1. Suicidal ideations   2. Major depressive disorder, single episode, severe without psychotic features    1. Suicidal ideations: New. With plan yet has not attempted; pt unable to contract safety in the office; currently living alone with joint custody of daughters; recommend behavioral health admission; pt initially resistant but then agreeable.  Sister present to transport pt to [facility]  ED for admission and evaluation for inpatient behavioral health treatment. 2. Major depression: worsening; with suicidal ideations.  Meds ordered this encounter  Medications  . predniSONE (DELTASONE) 5 MG tablet    Sig: Take 5 mg by mouth daily with breakfast.  . venlafaxine XR (EFFEXOR-XR) 150 MG 24 hr capsule    Sig: Take 150 mg by mouth daily with breakfast. Takes 150 mg in the morning and if needed 75 mg in the afternoon  . LORazepam (ATIVAN) 0.5 MG tablet    Sig: Take 0.5 mg by mouth every 8 (eight) hours.    No Follow-up on file.    [name], M.D.  [facility] [address] [place], [place]  [address] [phone] phone [phone] fax

## 2014-05-03 NOTE — ED Provider Notes (Signed)
CSN: [id number]     Arrival date & time 05/03/14  1817 History   First MD Initiated Contact with Patient 05/03/14 1910     Chief Complaint  Patient presents with  . Suicidal      (Consider location/radiation/quality/duration/timing/severity/associated sxs/prior Treatment) Patient is a 43 y.o. female presenting with mental health disorder.  Mental Health Problem Presenting symptoms: suicidal thoughts   Patient accompanied by:  Family member Degree of incapacity (severity):  Moderate Onset quality:  Gradual Timing:  Constant Progression:  Worsening Chronicity:  New Context: stressful life event   Context: not noncompliant   Treatment compliance:  All of the time Relieved by:  Nothing Worsened by:  Nothing tried Associated symptoms: no abdominal pain and no decreased need for sleep     Past Medical History  Diagnosis Date  . Anxiety   . Asthma   . GERD (gastroesophageal reflux disease)   . 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 Allergy     s/p allergy testing; allergic to dogs, cats, molds, horses.   Past Surgical History  Procedure Laterality Date  . Rhinoplasty      nasal fracture  . Carpal tunnel release      R.  [name].  . Breast surgery  07/07/2007    Augmentation  . Abdominal hysterectomy  07/06/2008    CERVIX and OVARIES intact; DUB.   Family History  Problem Relation Age of Onset  . Asthma Mother   . Depression Mother   . Diabetes Mother   . Hyperlipidemia Mother   . Hypertension Mother   . Mental illness Mother    History  Substance Use Topics  . Smoking status: Never Smoker   . Smokeless tobacco: Never Used  . Alcohol Use: No   OB History   Grav Para Term Preterm Abortions TAB SAB Ect Mult Living                 Review of Systems  Constitutional: Negative for fever.  Respiratory: Negative for cough and shortness of breath.   Gastrointestinal: Negative for vomiting and abdominal pain.  Psychiatric/Behavioral:  Positive for suicidal ideas.  All other systems reviewed and are negative.     Allergies  Erythromycin base and Nitrofurantoin  Home Medications   Prior to Admission medications   Medication Sig Start Date End Date Taking? Authorizing Provider  cetirizine (ZYRTEC) 10 MG tablet Take 10 mg by mouth daily.    Historical Provider, MD  dexlansoprazole (DEXILANT) 60 MG capsule Take 60 mg by mouth daily.    Historical Provider, MD  fluticasone-salmeterol (ADVAIR HFA) 115-21 MCG/ACT inhaler Inhale 2 puffs into the lungs 2 (two) times daily.    Historical Provider, MD  folic acid (FOLVITE) 1 MG tablet Take 1 mg by mouth daily.    Historical Provider, MD  gentamicin ointment (GARAMYCIN) 0.1 % Apply 1 application topically 2 (two) times daily.    Historical Provider, MD  LORazepam (ATIVAN) 0.5 MG tablet Take 0.5 mg by mouth every 8 (eight) hours.    Historical Provider, MD  methotrexate (RHEUMATREX) 2.5 MG tablet Take 20 mg by mouth once a week. Take 6 tablets once weekly.  Caution:Chemotherapy. Protect from light.    Historical Provider, MD  montelukast (SINGULAIR) 10 MG tablet Take 10 mg by mouth at bedtime.    Historical Provider, MD  predniSONE (DELTASONE) 5 MG tablet Take 5 mg by mouth daily with breakfast.    Historical Provider, MD  venlafaxine XR (  EFFEXOR-XR) 150 MG 24 hr capsule Take 150 mg by mouth daily with breakfast. Takes 150 mg in the morning and if needed 75 mg in the afternoon    Historical Provider, MD  venlafaxine XR (EFFEXOR-XR) 75 MG 24 hr capsule Take 3 capsules (225 mg total) by mouth daily with breakfast. 02/07/14   [name], MD   BP 123/87  Pulse 110  Temp(Src) 98.8 F (37.1 C) (Oral)  Resp 18  SpO2 98% Physical Exam  Nursing note and vitals reviewed. Constitutional: She is oriented to person, place, and time. She appears well-developed and well-nourished. No distress.  HENT:  Head: Normocephalic and atraumatic.  Mouth/Throat: Oropharynx is clear and moist.   Eyes: EOM are normal. Pupils are equal, round, and reactive to light.  Neck: Normal range of motion. Neck supple.  Cardiovascular: Normal rate and regular rhythm.  Exam reveals no friction rub.   No murmur heard. Pulmonary/Chest: Effort normal and breath sounds normal. No respiratory distress. She has no wheezes. She has no rales.  Abdominal: Soft. She exhibits no distension. There is no tenderness. There is no rebound.  Musculoskeletal: Normal range of motion. She exhibits no edema.  Neurological: She is alert and oriented to person, place, and time. No cranial nerve deficit. She exhibits normal muscle tone. Coordination normal.  Skin: No rash noted. She is not diaphoretic.  Psychiatric: She expresses suicidal ideation. She expresses suicidal plans.    ED Course  Procedures (including critical care time) Labs Review Labs Reviewed  CBC - Abnormal; Notable for the following:    RDW 16.6 (*)    All other components within normal limits  URINE RAPID DRUG SCREEN (HOSP PERFORMED)  ACETAMINOPHEN LEVEL  COMPREHENSIVE METABOLIC PANEL  ETHANOL  SALICYLATE LEVEL    Imaging Review No results found.   EKG Interpretation None      MDM   Final diagnoses:  Severe recurrent major depression w/psychotic features, mood-congruent  Generalized anxiety disorder    43 year old here with suicidal ideation. Has had worsening stress since her separation from her husband this summer. Actually worsening stress, but it culminated with her husband having the kids for the weekend with a new girlfriend at a wedding. She is suicidal. She has stated that she had a plan to use a gun. She asked her husband bring a gun over to the house for protection but had thought about using it in her life. She is here voluntarily, will consult TTS.    Evelina Bucy, MD 05/07/14 [phone]

## 2014-05-03 NOTE — ED Notes (Signed)
Pt family took all belongings.

## 2014-05-03 NOTE — ED Notes (Signed)
Pt was seen at [facility] for SI and couldn't contract for safety, so Catha Gosselin MD with Urgent Care referred pt here for evaluation.  Pt was transported here by her sister.

## 2014-05-03 NOTE — ED Notes (Signed)
Pt. To [facility] from ED ambulatory without difficulty. Pt. Is alert and oriented, warm and dry in no distress. Pt. Denies SI, HI, and AVH. Pt. Calm and cooperative. Pt. Encouraged to let Nursing staff know of any concerns or needs.  

## 2014-05-03 NOTE — ED Notes (Signed)
See note from pcp from today. Pt has been going through divorce since May, this month pts children stated they wanted joint custody and to live with their dad and his gf. Pt has become more depressed because of this. Pt has been SI x1 week. Thoughts to shoot herself with a gun. Pt told her ex husband a Engineer, structural she wanted one of his guns "for safety". Ex husband refused to giver her gun. Pt has been drinking daily in last few weeks. Past ETOH use was Tuesday night. Drinks 1-6 drinks/day. Pt has been on Effexor for years, since having postpartum depression. Pt denies HI, AH/VH.

## 2014-05-03 NOTE — BH Assessment (Signed)
Tele Assessment Note   [name] is a 43 y.o. female who voluntarily presents to [facility] with SI/depression and anxiety.  Pt reports that she went to [facility] for worsening depression, anxiety and SI and was told to come the emerg dept for psych eval.   Pt told this writer that she was married for 22 yrs and has been separated since 12/04/13.  Pt states she learned that her spouse had been having an affair.  Pt states that recently, her children decided that they wanted joint custody with both parents and wants to spend more time with their father and his girlfriend.  Pt states she feels guilty that she cannot provide a home environment for her children and that they choose to spend more time with "the new mommy".  Pt is crying during the interview, stating that he told her on mother's day that he was leaving her    Pt states she has been SI x1 week w/a plan to shoot herself.  Pt.'s ex-spouse is a Engineer, structural and she wanted one of his guns for "safety reasons" and he refused to give her the gun.  Pt is no longer endorsing SI thoughts, plan or intent, she says she feels safe returning home. She has a good support system with her sister.  Pt has no past inpt/oupt mental health hx, stating that she was treated for postpartum depression after each of her children was born.  Pt denies HI/AVH but she has been drinking more in the past few weeks, consuming 1-6 drinks, daily.  Pt reports other stressors: pt.'s, daughter and father's health deteriorated.  Pt endorses depressive sxs: crying spells, racing thoughts and anxiety with panic attacks.       Axis I: Anxiety Disorder NOS and Major Depression, single episode Axis II: Deferred Axis III:  Past Medical History  Diagnosis Date  . Anxiety   . Asthma   . GERD (gastroesophageal reflux disease)   . 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 Allergy     s/p allergy testing; allergic to dogs, cats, molds, horses.  .  Depression    Axis IV: other psychosocial or environmental problems, problems related to social environment and problems with primary support group Axis V: 41-50 serious symptoms  Past Medical History:  Past Medical History  Diagnosis Date  . Anxiety   . Asthma   . GERD (gastroesophageal reflux disease)   . 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 Allergy     s/p allergy testing; allergic to dogs, cats, molds, horses.  . Depression     Past Surgical History  Procedure Laterality Date  . Rhinoplasty      nasal fracture  . Carpal tunnel release      R.  [name].  . Breast surgery  07/07/2007    Augmentation  . Abdominal hysterectomy  07/06/2008    CERVIX and OVARIES intact; DUB.    Family History:  Family History  Problem Relation Age of Onset  . Asthma Mother   . Depression Mother   . Diabetes Mother   . Hyperlipidemia Mother   . Hypertension Mother   . Mental illness Mother     Social History:  reports that she has never smoked. She has never used smokeless tobacco. She reports that she drinks alcohol. She reports that she does not use illicit drugs.  Additional Social History:  Alcohol / Drug Use Pain Medications: See  MAR  Prescriptions: See MAR  Over the Counter: See MAR  History of alcohol / drug use?: Yes Longest period of sobriety (when/how long): Alcohol use has increased in the past few weeks due to stressors   CIWA: CIWA-Ar BP: 123/87 mmHg Pulse Rate: 110 COWS:    PATIENT STRENGTHS: (choose at least two) Communication skills Motivation for treatment/growth Supportive family/friends  Allergies:  Allergies  Allergen Reactions  . Erythromycin Base Nausea And Vomiting  . Nitrofurantoin     REACTION: Nausea and vomiting    Home Medications:  (Not in a hospital admission)  OB/GYN Status:  No LMP recorded. Patient has had a hysterectomy.  General Assessment Data Location of Assessment: [facility] ED Is this a Tele or  Face-to-Face Assessment?: Face-to-Face Is this an Initial Assessment or a Re-assessment for this encounter?: Initial Assessment Living Arrangements: Children (Joint custody of children ) Can pt return to current living arrangement?: Yes Admission Status: Voluntary Is patient capable of signing voluntary admission?: Yes Transfer from: Home Referral Source: Self/Family/Friend  Medical Screening Exam ([place]) Medical Exam completed: No Reason for MSE not completed: Other: (None )  [facility] Crisis Care Plan Living Arrangements: Children (Joint custody of children ) Name of Psychiatrist: None  Name of Therapist: EAP   Education Status Is patient currently in school?: No Current Grade: None  Highest grade of school patient has completed: None  Name of school: None  Contact person: None   Risk to self with the past 6 months Suicidal Ideation: No-Not Currently/Within Last 6 Months Suicidal Intent: No-Not Currently/Within Last 6 Months Is patient at risk for suicide?: No Suicidal Plan?: No-Not Currently/Within Last 6 Months Access to Means: No What has been your use of drugs/alcohol within the last 12 months?: Increased alcohol use in the past few weeks  Previous Attempts/Gestures: No How many times?: 0 Other Self Harm Risks: None  Triggers for Past Attempts: None known Intentional Self Injurious Behavior: None Family Suicide History: No Recent stressful life event(s): Divorce;Loss (Comment);Recent negative physical changes;Other (Comment) (Pls see EPIC note ) Persecutory voices/beliefs?: No Depression: Yes Depression Symptoms: Tearfulness;Insomnia;Fatigue;Guilt;Loss of interest in usual pleasures;Feeling worthless/self pity Substance abuse history and/or treatment for substance abuse?: No Suicide prevention information given to non-admitted patients: Not applicable  Risk to Others within the past 6 months Homicidal Ideation: No Thoughts of Harm to Others: No Current  Homicidal Intent: No Current Homicidal Plan: No Access to Homicidal Means: No Identified Victim: None  History of harm to others?: No Assessment of Violence: None Noted Violent Behavior Description: None  Does patient have access to weapons?: No Criminal Charges Pending?: No Does patient have a court date: No  Psychosis Hallucinations: None noted Delusions: None noted  Mental Status Report Appear/Hygiene: In scrubs Eye Contact: Fair Motor Activity: Unremarkable Speech: Logical/coherent;Soft Level of Consciousness: Alert;Crying Mood: Depressed;Anxious;Guilty;Ashamed/humiliated;Sad Affect: Anxious;Depressed;Sad;Flat Anxiety Level: Minimal Thought Processes: Coherent;Relevant Judgement: Unimpaired Orientation: Person;Place;Time;Situation Obsessive Compulsive Thoughts/Behaviors: None  Cognitive Functioning Concentration: Decreased Memory: Recent Intact;Remote Intact IQ: Average Insight: Fair Impulse Control: Fair Appetite: Fair Weight Loss: 0 Weight Gain: 0 Sleep: Decreased Total Hours of Sleep: 5 Vegetative Symptoms: None  ADLScreening [facility] Assessment Services) Patient's cognitive ability adequate to safely complete daily activities?: Yes Patient able to express need for assistance with ADLs?: Yes Independently performs ADLs?: Yes (appropriate for developmental age)  Prior Inpatient Therapy Prior Inpatient Therapy: No Prior Therapy Dates: None  Prior Therapy Facilty/Provider(s): None  Reason for Treatment: None   Prior Outpatient Therapy Prior Outpatient Therapy: Yes  Prior Therapy Dates: Current  Prior Therapy Facilty/Provider(s): EAP(employee assistance program)  Reason for Treatment: Therapy   ADL Screening (condition at time of admission) Patient's cognitive ability adequate to safely complete daily activities?: Yes Is the patient deaf or have difficulty hearing?: No Does the patient have difficulty seeing, even when wearing glasses/contacts?: No Does  the patient have difficulty concentrating, remembering, or making decisions?: Yes Patient able to express need for assistance with ADLs?: Yes Does the patient have difficulty dressing or bathing?: No Independently performs ADLs?: Yes (appropriate for developmental age) Does the patient have difficulty walking or climbing stairs?: No Weakness of Legs: None Weakness of Arms/Hands: None  Home Assistive Devices/Equipment Home Assistive Devices/Equipment: None  Therapy Consults (therapy consults require a physician order) PT Evaluation Needed: No OT Evalulation Needed: No SLP Evaluation Needed: No Abuse/Neglect Assessment (Assessment to be complete while patient is alone) Physical Abuse: Denies Verbal Abuse: Denies Sexual Abuse: Denies Exploitation of patient/patient's resources: Denies Self-Neglect: Denies Values / Beliefs Cultural Requests During Hospitalization: None Spiritual Requests During Hospitalization: None Consults Spiritual Care Consult Needed: No Social Work Consult Needed: No Regulatory affairs officer (For Healthcare) Does patient have an advance directive?: No Would patient like information on creating an advanced directive?: No - patient declined information Nutrition Screen- [facility] Patient's home diet: Regular  Additional Information 1:1 In Past 12 Months?: No CIRT Risk: No Elopement Risk: No Does patient have medical clearance?: Yes     Disposition:  Disposition Initial Assessment Completed for this Encounter: Yes Disposition of Patient: Referred to (AM psych eval for final disposition ) Patient referred to: Other (Comment) (AM psych eval for final disposition )  Girtha Rm 05/03/2014 10:45 PM

## 2014-05-04 ENCOUNTER — Encounter (HOSPITAL_COMMUNITY): Payer: Self-pay | Admitting: *Deleted

## 2014-05-04 ENCOUNTER — Inpatient Hospital Stay (HOSPITAL_COMMUNITY)
Admission: AD | Admit: 2014-05-04 | Discharge: 2014-05-08 | DRG: 885 | Disposition: A | Discharge status: Home / Self Care | Payer: 59 | Source: Intra-hospital | Attending: Psychiatry | Admitting: Psychiatry

## 2014-05-04 ENCOUNTER — Encounter (HOSPITAL_COMMUNITY): Payer: Self-pay | Admitting: Psychiatry

## 2014-05-04 DIAGNOSIS — Z791 Long term (current) use of non-steroidal anti-inflammatories (NSAID): Secondary | ICD-10-CM

## 2014-05-04 DIAGNOSIS — K219 Gastro-esophageal reflux disease without esophagitis: Secondary | ICD-10-CM | POA: Diagnosis present

## 2014-05-04 DIAGNOSIS — F332 Major depressive disorder, recurrent severe without psychotic features: Secondary | ICD-10-CM

## 2014-05-04 DIAGNOSIS — R45851 Suicidal ideations: Secondary | ICD-10-CM | POA: Diagnosis present

## 2014-05-04 DIAGNOSIS — Z7952 Long term (current) use of systemic steroids: Secondary | ICD-10-CM | POA: Diagnosis not present

## 2014-05-04 DIAGNOSIS — Z833 Family history of diabetes mellitus: Secondary | ICD-10-CM | POA: Diagnosis not present

## 2014-05-04 DIAGNOSIS — Z825 Family history of asthma and other chronic lower respiratory diseases: Secondary | ICD-10-CM

## 2014-05-04 DIAGNOSIS — G4700 Insomnia, unspecified: Secondary | ICD-10-CM | POA: Diagnosis present

## 2014-05-04 DIAGNOSIS — F411 Generalized anxiety disorder: Secondary | ICD-10-CM | POA: Diagnosis present

## 2014-05-04 DIAGNOSIS — F410 Panic disorder [episodic paroxysmal anxiety] without agoraphobia: Secondary | ICD-10-CM | POA: Diagnosis present

## 2014-05-04 DIAGNOSIS — J45909 Unspecified asthma, uncomplicated: Secondary | ICD-10-CM | POA: Diagnosis present

## 2014-05-04 DIAGNOSIS — Z8249 Family history of ischemic heart disease and other diseases of the circulatory system: Secondary | ICD-10-CM

## 2014-05-04 DIAGNOSIS — F329 Major depressive disorder, single episode, unspecified: Secondary | ICD-10-CM | POA: Diagnosis present

## 2014-05-04 DIAGNOSIS — F323 Major depressive disorder, single episode, severe with psychotic features: Secondary | ICD-10-CM | POA: Diagnosis not present

## 2014-05-04 DIAGNOSIS — F333 Major depressive disorder, recurrent, severe with psychotic symptoms: Secondary | ICD-10-CM | POA: Diagnosis present

## 2014-05-04 LAB — URINALYSIS, ROUTINE W REFLEX MICROSCOPIC
Bilirubin Urine: NEGATIVE
Glucose, UA: NEGATIVE mg/dL
Hgb urine dipstick: NEGATIVE
Ketones, ur: NEGATIVE mg/dL
Nitrite: NEGATIVE
Protein, ur: NEGATIVE mg/dL
Specific Gravity, Urine: 1.029 (ref 1.005–1.030)
Urobilinogen, UA: 0.2 mg/dL (ref 0.0–1.0)
pH: 6.5 (ref 5.0–8.0)

## 2014-05-04 LAB — URINE MICROSCOPIC-ADD ON

## 2014-05-04 MED ORDER — MONTELUKAST SODIUM 10 MG PO TABS
10.0000 mg | ORAL_TABLET | Freq: Every day | ORAL | Status: DC
  Administered 2014-05-04: 10 mg via ORAL
  Filled 2014-05-04: qty 1

## 2014-05-04 MED ORDER — LORAZEPAM 0.5 MG PO TABS: 0.5000 mg | ORAL_TABLET | Freq: Three times a day (TID) | ORAL | Status: DC | PRN

## 2014-05-04 MED ORDER — FOLIC ACID 1 MG PO TABS
1.0000 mg | ORAL_TABLET | Freq: Every day | ORAL | Status: DC
  Administered 2014-05-04: 1 mg via ORAL
  Filled 2014-05-04: qty 1

## 2014-05-04 MED ORDER — FOLIC ACID 1 MG PO TABS
1.0000 mg | ORAL_TABLET | Freq: Every day | ORAL | Status: DC
  Administered 2014-05-05 – 2014-05-08 (×4): 1 mg via ORAL
  Filled 2014-05-04 (×6): qty 1

## 2014-05-04 MED ORDER — PANTOPRAZOLE SODIUM 40 MG PO TBEC
40.0000 mg | DELAYED_RELEASE_TABLET | Freq: Every day | ORAL | Status: DC
  Administered 2014-05-05 – 2014-05-08 (×4): 40 mg via ORAL
  Filled 2014-05-04 (×6): qty 1

## 2014-05-04 MED ORDER — PREDNISONE 5 MG PO TABS
5.0000 mg | ORAL_TABLET | Freq: Every day | ORAL | Status: DC
  Administered 2014-05-04: 5 mg via ORAL
  Filled 2014-05-04 (×2): qty 1

## 2014-05-04 MED ORDER — ACETAMINOPHEN 325 MG PO TABS
650.0000 mg | ORAL_TABLET | Freq: Four times a day (QID) | ORAL | Status: DC | PRN
Start: 2014-05-04 — End: 2014-05-08
  Administered 2014-05-04: 650 mg via ORAL

## 2014-05-04 MED ORDER — ONDANSETRON HCL 4 MG PO TABS: 4.0000 mg | ORAL_TABLET | Freq: Three times a day (TID) | ORAL | Status: DC | PRN

## 2014-05-04 MED ORDER — ALBUTEROL SULFATE HFA 108 (90 BASE) MCG/ACT IN AERS: 2.0000 | INHALATION_SPRAY | Freq: Four times a day (QID) | RESPIRATORY_TRACT | Status: DC | PRN

## 2014-05-04 MED ORDER — ALUM & MAG HYDROXIDE-SIMETH 200-200-20 MG/5ML PO SUSP: 30.0000 mL | ORAL | Status: DC | PRN

## 2014-05-04 MED ORDER — METHOTREXATE 2.5 MG PO TABS: 20.0000 mg | ORAL_TABLET | ORAL | Status: DC

## 2014-05-04 MED ORDER — CIPROFLOXACIN HCL 500 MG PO TABS
500.0000 mg | ORAL_TABLET | Freq: Two times a day (BID) | ORAL | Status: DC
  Administered 2014-05-04 (×2): 500 mg via ORAL
  Filled 2014-05-04 (×2): qty 1

## 2014-05-04 MED ORDER — MOMETASONE FURO-FORMOTEROL FUM 100-5 MCG/ACT IN AERO
2.0000 | INHALATION_SPRAY | Freq: Two times a day (BID) | RESPIRATORY_TRACT | Status: DC
  Administered 2014-05-04: 2 via RESPIRATORY_TRACT
  Filled 2014-05-04: qty 8.8

## 2014-05-04 MED ORDER — ALBUTEROL SULFATE HFA 108 (90 BASE) MCG/ACT IN AERS
2.0000 | INHALATION_SPRAY | Freq: Four times a day (QID) | RESPIRATORY_TRACT | Status: DC | PRN
  Administered 2014-05-05: 2 via RESPIRATORY_TRACT
  Filled 2014-05-04: qty 6.7

## 2014-05-04 MED ORDER — ACETAMINOPHEN 325 MG PO TABS
ORAL_TABLET | ORAL | Status: AC
  Filled 2014-05-04: qty 2

## 2014-05-04 MED ORDER — MAGNESIUM HYDROXIDE 400 MG/5ML PO SUSP
30.0000 mL | Freq: Every day | ORAL | Status: DC | PRN
Start: 2014-05-04 — End: 2014-05-08

## 2014-05-04 MED ORDER — CIPROFLOXACIN HCL 500 MG PO TABS
500.0000 mg | ORAL_TABLET | Freq: Two times a day (BID) | ORAL | Status: DC
  Administered 2014-05-04 – 2014-05-08 (×8): 500 mg via ORAL
  Filled 2014-05-04 (×11): qty 1

## 2014-05-04 MED ORDER — MOMETASONE FURO-FORMOTEROL FUM 100-5 MCG/ACT IN AERO
2.0000 | INHALATION_SPRAY | Freq: Two times a day (BID) | RESPIRATORY_TRACT | Status: DC
  Administered 2014-05-04 – 2014-05-08 (×8): 2 via RESPIRATORY_TRACT
  Filled 2014-05-04 (×2): qty 8.8

## 2014-05-04 MED ORDER — ACETAMINOPHEN 325 MG PO TABS: 650.0000 mg | ORAL_TABLET | ORAL | Status: DC | PRN

## 2014-05-04 MED ORDER — VENLAFAXINE HCL ER 150 MG PO CP24
150.0000 mg | ORAL_CAPSULE | Freq: Every day | ORAL | Status: DC
  Administered 2014-05-05 – 2014-05-08 (×4): 150 mg via ORAL
  Filled 2014-05-04 (×2): qty 1
  Filled 2014-05-04: qty 4
  Filled 2014-05-04: qty 1
  Filled 2014-05-04: qty 4
  Filled 2014-05-04 (×2): qty 1

## 2014-05-04 MED ORDER — MONTELUKAST SODIUM 10 MG PO TABS
10.0000 mg | ORAL_TABLET | Freq: Every day | ORAL | Status: DC
  Administered 2014-05-05 – 2014-05-08 (×4): 10 mg via ORAL
  Filled 2014-05-04 (×6): qty 1

## 2014-05-04 MED ORDER — VENLAFAXINE HCL ER 150 MG PO CP24
150.0000 mg | ORAL_CAPSULE | Freq: Every day | ORAL | Status: DC
  Administered 2014-05-04: 150 mg via ORAL
  Filled 2014-05-04 (×2): qty 1

## 2014-05-04 MED ORDER — IBUPROFEN 200 MG PO TABS: 600.0000 mg | ORAL_TABLET | Freq: Four times a day (QID) | ORAL | Status: DC | PRN

## 2014-05-04 MED ORDER — METHOTREXATE 2.5 MG PO TABS
20.0000 mg | ORAL_TABLET | ORAL | Status: DC
  Filled 2014-05-04: qty 8

## 2014-05-04 MED ORDER — PANTOPRAZOLE SODIUM 40 MG PO TBEC
40.0000 mg | DELAYED_RELEASE_TABLET | Freq: Every day | ORAL | Status: DC
  Administered 2014-05-04: 40 mg via ORAL
  Filled 2014-05-04: qty 1

## 2014-05-04 MED ORDER — PREDNISONE 5 MG PO TABS
5.0000 mg | ORAL_TABLET | Freq: Every day | ORAL | Status: DC
  Administered 2014-05-05 – 2014-05-08 (×4): 5 mg via ORAL
  Filled 2014-05-04 (×6): qty 1

## 2014-05-04 MED ORDER — ZOLPIDEM TARTRATE 5 MG PO TABS
5.0000 mg | ORAL_TABLET | Freq: Every evening | ORAL | Status: DC | PRN
  Administered 2014-05-04 – 2014-05-08 (×5): 5 mg via ORAL
  Filled 2014-05-04 (×5): qty 1

## 2014-05-04 NOTE — Progress Notes (Signed)
PHARMACY BRIEF NOTE - METHOTREXATE  Patient reportedly gave history of taking methotrexate 20 mg PO twice weekly for autoimmune disease (Wegener's is included on problem list).     However, both the clinic records and refill records from [facility] as accessed through [place] indicate that her dosage is actually 20 mg PO once weekly.  Have corrected the inpatient methotrexate order to this dosage and have corrected the medication history to reflect the correct dosage as per rheumatology clinic records.  Clayburn Pert, PharmD, BCPS Pager: [phone] 05/04/2014  6:37 AM

## 2014-05-04 NOTE — Consult Note (Signed)
[facility] Face-to-Face Psychiatry Consult   Reason for Consult:  SI Referring Physician:  EDP Julieanna Geraci is an 43 y.o. female. Total Time spent with patient: 20 minutes  Assessment: AXIS I:  Major Depression, Recurrent severe AXIS II:  Deferred AXIS III:   Past Medical History  Diagnosis Date  . Anxiety   . Asthma   . GERD (gastroesophageal reflux disease)   . 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 Allergy     s/p allergy testing; allergic to dogs, cats, molds, horses.  . Depression    AXIS IV:  other psychosocial or environmental problems and problems related to social environment AXIS V:  41-50 serious symptoms  Plan:  Recommend psychiatric Inpatient admission when medically cleared.  HPI:  Mrs. [name] is a 43 y.o. Female admitted with increased depressive symptoms and SI for the past week. Patient has been drinking daily for the past few weeks; however denies any recreational or illicit drug use or abuse. Mrs. [name] stated her depressive symptoms gradually started to get worse since May this year when her husband told her he was leaving her and wanted a divorce. She started to experience SI after her daughters told her they want their parents to have joint custody and to live with their father and his girlfriend; she stated "I felt like I was losing my family". Her plan was to shoot herself with a gun, and had even asked her estranged-husband who is a Engineer, structural to let her borrow a gun from him for "safety" at her house since she now lives alone with her daughters; he did not give her a gun though. Mrs. [name] states she wants help with her depression and is willing to obtain inpatient treatment. HPI Elements:   Location:  generalized. Quality:  acute. Severity:  severe. Timing:  constant. Duration:  one week. Context:  stressors.  Past Psychiatric History: Past Medical History  Diagnosis Date  . Anxiety   . Asthma   . GERD (gastroesophageal  reflux disease)   . 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 Allergy     s/p allergy testing; allergic to dogs, cats, molds, horses.  . Depression     reports that she has never smoked. She has never used smokeless tobacco. She reports that she drinks alcohol. She reports that she does not use illicit drugs. Family History  Problem Relation Age of Onset  . Asthma Mother   . Depression Mother   . Diabetes Mother   . Hyperlipidemia Mother   . Hypertension Mother   . Mental illness Mother    Family History Substance Abuse: No Family Supports: Yes, List: (Sister ) Living Arrangements: Children (Joint custody of children ) Can pt return to current living arrangement?: Yes Abuse/Neglect [facility]) Physical Abuse: Denies Verbal Abuse: Denies Sexual Abuse: Denies Allergies:   Allergies  Allergen Reactions  . Erythromycin Base Nausea And Vomiting  . Nitrofurantoin     REACTION: Nausea and vomiting    ACT Assessment Complete:  Yes:    Educational Status    Risk to Self: Risk to self with the past 6 months Suicidal Ideation: No-Not Currently/Within Last 6 Months Suicidal Intent: No-Not Currently/Within Last 6 Months Is patient at risk for suicide?: No Suicidal Plan?: No-Not Currently/Within Last 6 Months Access to Means: No What has been your use of drugs/alcohol within the last 12 months?: Increased alcohol use in the past few weeks  Previous  Attempts/Gestures: No How many times?: 0 Other Self Harm Risks: None  Triggers for Past Attempts: None known Intentional Self Injurious Behavior: None Family Suicide History: No Recent stressful life event(s): Divorce;Loss (Comment);Recent negative physical changes;Other (Comment) (Pls see EPIC note ) Persecutory voices/beliefs?: No Depression: Yes Depression Symptoms: Tearfulness;Insomnia;Fatigue;Guilt;Loss of interest in usual pleasures;Feeling worthless/self pity Substance abuse history and/or treatment for  substance abuse?: No Suicide prevention information given to non-admitted patients: Not applicable  Risk to Others: Risk to Others within the past 6 months Homicidal Ideation: No Thoughts of Harm to Others: No Current Homicidal Intent: No Current Homicidal Plan: No Access to Homicidal Means: No Identified Victim: None  History of harm to others?: No Assessment of Violence: None Noted Violent Behavior Description: None  Does patient have access to weapons?: No Criminal Charges Pending?: No Does patient have a court date: No  Abuse: Abuse/Neglect Assessment (Assessment to be complete while patient is alone) Physical Abuse: Denies Verbal Abuse: Denies Sexual Abuse: Denies Exploitation of patient/patient's resources: Denies Self-Neglect: Denies  Prior Inpatient Therapy: Prior Inpatient Therapy Prior Inpatient Therapy: No Prior Therapy Dates: None  Prior Therapy Facilty/Provider(s): None  Reason for Treatment: None   Prior Outpatient Therapy: Prior Outpatient Therapy Prior Outpatient Therapy: Yes Prior Therapy Dates: Current  Prior Therapy Facilty/Provider(s): [facility](employee assistance program)  Reason for Treatment: Therapy   Additional Information: Additional Information 1:1 In Past 12 Months?: No CIRT Risk: No Elopement Risk: No Does patient have medical clearance?: Yes                  Objective: Blood pressure 147/82, pulse 105, temperature 98.4 F (36.9 C), temperature source Oral, resp. rate 18, SpO2 99.00%.There is no weight on file to calculate BMI. Results for orders placed during the hospital encounter of 05/03/14 (from the past 72 hour(s))  URINE RAPID DRUG SCREEN (HOSP PERFORMED)     Status: None   Collection Time    05/03/14  7:08 PM      Result Value Ref Range   Opiates NONE DETECTED  NONE DETECTED   Cocaine NONE DETECTED  NONE DETECTED   Benzodiazepines NONE DETECTED  NONE DETECTED   Amphetamines NONE DETECTED  NONE DETECTED    Tetrahydrocannabinol NONE DETECTED  NONE DETECTED   Barbiturates NONE DETECTED  NONE DETECTED   Comment:            DRUG SCREEN FOR MEDICAL PURPOSES     ONLY.  IF CONFIRMATION IS NEEDED     FOR ANY PURPOSE, NOTIFY LAB     WITHIN 5 DAYS.                LOWEST DETECTABLE LIMITS     FOR URINE DRUG SCREEN     Drug Class       Cutoff (ng/mL)     Amphetamine      1000     Barbiturate      200     Benzodiazepine   767     Tricyclics       209     Opiates          300     Cocaine          300     THC              50  ACETAMINOPHEN LEVEL     Status: None   Collection Time    05/03/14  7:12 PM      Result Value Ref Range  Acetaminophen (Tylenol), Serum <15.0  10 - 30 ug/mL   Comment:            THERAPEUTIC CONCENTRATIONS VARY     SIGNIFICANTLY. A RANGE OF 10-30     ug/mL MAY BE AN EFFECTIVE     CONCENTRATION FOR MANY PATIENTS.     HOWEVER, SOME ARE BEST TREATED     AT CONCENTRATIONS OUTSIDE THIS     RANGE.     ACETAMINOPHEN CONCENTRATIONS     >150 ug/mL AT 4 HOURS AFTER     INGESTION AND >50 ug/mL AT 12     HOURS AFTER INGESTION ARE     OFTEN ASSOCIATED WITH TOXIC     REACTIONS.  CBC     Status: Abnormal   Collection Time    05/03/14  7:12 PM      Result Value Ref Range   WBC 9.8  4.0 - 10.5 K/uL   RBC 3.91  3.87 - 5.11 MIL/uL   Hemoglobin 12.1  12.0 - 15.0 g/dL   HCT 36.4  36.0 - 46.0 %   MCV 93.1  78.0 - 100.0 fL   MCH 30.9  26.0 - 34.0 pg   MCHC 33.2  30.0 - 36.0 g/dL   RDW 16.6 (*) 11.5 - 15.5 %   Platelets 340  150 - 400 K/uL  COMPREHENSIVE METABOLIC PANEL     Status: None   Collection Time    05/03/14  7:12 PM      Result Value Ref Range   Sodium 139  137 - 147 mEq/L   Potassium 4.1  3.7 - 5.3 mEq/L   Chloride 103  96 - 112 mEq/L   CO2 24  19 - 32 mEq/L   Glucose, Bld 94  70 - 99 mg/dL   BUN 12  6 - 23 mg/dL   Creatinine, Ser 0.66  0.50 - 1.10 mg/dL   Calcium 9.1  8.4 - 10.5 mg/dL   Total Protein 6.6  6.0 - 8.3 g/dL   Albumin 3.5  3.5 - 5.2 g/dL   AST 22   0 - 37 U/L   ALT 12  0 - 35 U/L   Alkaline Phosphatase 64  39 - 117 U/L   Total Bilirubin 0.4  0.3 - 1.2 mg/dL   GFR calc non Af Amer >90  >90 mL/min   GFR calc Af Amer >90  >90 mL/min   Comment: (NOTE)     The eGFR has been calculated using the CKD EPI equation.     This calculation has not been validated in all clinical situations.     eGFR's persistently <90 mL/min signify possible Chronic Kidney     Disease.   Anion gap 12  5 - 15  ETHANOL     Status: None   Collection Time    05/03/14  7:12 PM      Result Value Ref Range   Alcohol, Ethyl (B) <11  0 - 11 mg/dL   Comment:            LOWEST DETECTABLE LIMIT FOR     SERUM ALCOHOL IS 11 mg/dL     FOR MEDICAL PURPOSES ONLY  SALICYLATE LEVEL     Status: Abnormal   Collection Time    05/03/14  7:12 PM      Result Value Ref Range   Salicylate Lvl <1.4 (*) 2.8 - 20.0 mg/dL  URINALYSIS, ROUTINE W REFLEX MICROSCOPIC     Status: Abnormal   Collection Time  05/04/14 12:30 AM      Result Value Ref Range   Color, Urine YELLOW  YELLOW   APPearance CLOUDY (*) CLEAR   Specific Gravity, Urine 1.029  1.005 - 1.030   pH 6.5  5.0 - 8.0   Glucose, UA NEGATIVE  NEGATIVE mg/dL   Hgb urine dipstick NEGATIVE  NEGATIVE   Bilirubin Urine NEGATIVE  NEGATIVE   Ketones, ur NEGATIVE  NEGATIVE mg/dL   Protein, ur NEGATIVE  NEGATIVE mg/dL   Urobilinogen, UA 0.2  0.0 - 1.0 mg/dL   Nitrite NEGATIVE  NEGATIVE   Leukocytes, UA MODERATE (*) NEGATIVE  URINE MICROSCOPIC-ADD ON     Status: Abnormal   Collection Time    05/04/14 12:30 AM      Result Value Ref Range   WBC, UA 7-10  <3 WBC/hpf   Bacteria, UA FEW (*) RARE   Urine-Other MUCOUS PRESENT     Labs are reviewed and are pertinent for no medical issues.  Current Facility-Administered Medications  Medication Dose Route Frequency Provider Last Rate Last Dose  . acetaminophen (TYLENOL) tablet 650 mg  650 mg Oral Q4H PRN Evelina Bucy, MD      . albuterol (PROVENTIL HFA;VENTOLIN HFA) 108 (90 BASE)  MCG/ACT inhaler 2 puff  2 puff Inhalation Q6H PRN Evelina Bucy, MD      . alum & mag hydroxide-simeth (MAALOX/MYLANTA) 200-200-20 MG/5ML suspension 30 mL  30 mL Oral PRN Evelina Bucy, MD      . ciprofloxacin (CIPRO) tablet 500 mg  500 mg Oral BID Kalman Drape, MD   500 mg at 05/04/14 1047  . folic acid (FOLVITE) tablet 1 mg  1 mg Oral Daily Evelina Bucy, MD   1 mg at 05/04/14 1041  . LORazepam (ATIVAN) tablet 0.5 mg  0.5 mg Oral Q8H PRN Evelina Bucy, MD      . LORazepam (ATIVAN) tablet 1 mg  1 mg Oral Q8H PRN Evelina Bucy, MD      . Derrill Memo ON 05/10/2014] methotrexate (RHEUMATREX) tablet 20 mg  20 mg Oral Weekly [name], MD      . mometasone-formoterol [facility]) 100-5 MCG/ACT inhaler 2 puff  2 puff Inhalation BID Evelina Bucy, MD   2 puff at 05/04/14 0900  . montelukast (SINGULAIR) tablet 10 mg  10 mg Oral Daily Evelina Bucy, MD   10 mg at 05/04/14 1041  . ondansetron (ZOFRAN) tablet 4 mg  4 mg Oral Q8H PRN Evelina Bucy, MD      . pantoprazole (PROTONIX) EC tablet 40 mg  40 mg Oral Daily Evelina Bucy, MD   40 mg at 05/04/14 1000  . predniSONE (DELTASONE) tablet 5 mg  5 mg Oral Q breakfast Evelina Bucy, MD   5 mg at 05/04/14 0900  . venlafaxine XR (EFFEXOR-XR) 24 hr capsule 150 mg  150 mg Oral Q breakfast Evelina Bucy, MD   150 mg at 05/04/14 0900  . zolpidem (AMBIEN) tablet 5 mg  5 mg Oral QHS PRN Evelina Bucy, MD   5 mg at 05/03/14 2151   Current Outpatient Prescriptions  Medication Sig Dispense Refill  . albuterol (PROVENTIL HFA;VENTOLIN HFA) 108 (90 BASE) MCG/ACT inhaler Inhale 2 puffs into the lungs every 6 (six) hours as needed for wheezing or shortness of breath.      . dexlansoprazole (DEXILANT) 60 MG capsule Take 60 mg by mouth daily.      . Fluticasone-Salmeterol (ADVAIR) 250-50 MCG/DOSE AEPB Inhale 1 puff into the lungs daily.      Marland Kitchen  folic acid (FOLVITE) 1 MG tablet Take 1 mg by mouth daily.      Marland Kitchen gentamicin ointment (GARAMYCIN) 0.1 % Apply 1 application topically 2 (two) times  daily.      Marland Kitchen ibuprofen (ADVIL,MOTRIN) 200 MG tablet Take 600 mg by mouth every 6 (six) hours as needed for moderate pain.      Marland Kitchen LORazepam (ATIVAN) 0.5 MG tablet Take 0.5 mg by mouth every 8 (eight) hours as needed for anxiety or sleep.       . methotrexate (RHEUMATREX) 2.5 MG tablet Take 20 mg by mouth once a week. Caution:Chemotherapy. Protect from light.      . montelukast (SINGULAIR) 10 MG tablet Take 10 mg by mouth daily.       . predniSONE (DELTASONE) 5 MG tablet Take 5 mg by mouth daily with breakfast.      . venlafaxine XR (EFFEXOR-XR) 150 MG 24 hr capsule Take 150 mg by mouth daily with breakfast. Takes 150 mg in the morning and if needed 75 mg in the afternoon      . venlafaxine XR (EFFEXOR-XR) 75 MG 24 hr capsule Take 75 mg by mouth daily as needed. Depression        Psychiatric Specialty Exam:     Blood pressure 147/82, pulse 105, temperature 98.4 F (36.9 C), temperature source Oral, resp. rate 18, SpO2 99.00%.There is no weight on file to calculate BMI.  General Appearance: Casual and Fairly Groomed  Engineer, water::  Fair  Speech:  Clear and Coherent and Normal Rate  Volume:  Normal  Mood:  Depressed  Affect:  Depressed and Tearful  Thought Process:  Coherent, Goal Directed and Intact  Orientation:  Full (Time, Place, and Person)  Thought Content:  WDL  Suicidal Thoughts:  Yes.  with intent/plan  Homicidal Thoughts:  No  Memory:  Immediate;   Good Recent;   Good Remote;   Good  Judgement:  Intact  Insight:  Fair  Psychomotor Activity:  Normal  Concentration:  Fair  Recall:  Halfway of Knowledge:Good  Language: Good  Akathisia:  NA  Handed:  Right  AIMS (if indicated):     Assets:  Communication Skills Desire for Improvement Financial Resources/Insurance Halls  Sleep:      Musculoskeletal: Strength & Muscle Tone: within normal limits Gait & Station: normal Patient  leans: N/A  Treatment Plan Summary: Daily contact with patient to assess and evaluate symptoms and progress in treatment Medication management; admit to inpatient hospitalization for stability  Waylan Boga, PMH-NP 05/04/2014 4:21 PM  Patient seen, evaluated and I agree with notes by Nurse Practitioner. Corena Pilgrim, MD

## 2014-05-04 NOTE — Progress Notes (Signed)
Pt has been accepted to [facility], room 303-1, accepting physician Dr. Sabra Heck.  Charlene Brooke, MSW  Social Worker [phone]

## 2014-05-04 NOTE — Progress Notes (Signed)
D: Patient in the dayroom on approach.  Patient states she is still trying to get adjusted to the unit.  Patient states he is depressed and jsut wants to feel betterso she can be a better mother.  Patient states she wants to get her life back.  Patient denies SI/HI and denies AVH. A: Staff to monitor Q 15 mins for safety.  Encouragement and support offered.  Scheduled medications administered per orders. R: Patient remains safe on the unit.  Patient attended group tonight.  Patient visible on the unit and interacting with peers.  Patient taking administered medications.

## 2014-05-04 NOTE — Progress Notes (Signed)
Pt has been assessed and meets inpt criteria for placement. Pt.'s clinicals faxed out to:   [facility]  Will continue to pursue placement.  Charlene Brooke, MSW  Social Worker [phone]

## 2014-05-04 NOTE — ED Notes (Signed)
Received report from [employer], pt sleeping on rounds. Resp. Reg. Maintained on q15 minute checks.

## 2014-05-04 NOTE — Progress Notes (Signed)
Pt is 43 yo caucasian female admitted to [facility] from [place] ED due to [facility] from [facility]. 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  She denies acitve SI. She says she is allergic to EES and Mycrobid. She says her PMH sign for asthma, anxiety, GERD, She is admitted to [facility] after  Admission complete.

## 2014-05-04 NOTE — ED Notes (Signed)
Discharge Note : Pt oreinted x3, cooperative.Report called to Drake Leach RN Pt sign voluntary consents for adult unit. VSS. Denies any pain, sister at bedside. Pt has contracted for safety, denies A/V hall. No belongings pt transported via [facility].

## 2014-05-05 DIAGNOSIS — F332 Major depressive disorder, recurrent severe without psychotic features: Principal | ICD-10-CM

## 2014-05-05 DIAGNOSIS — R45851 Suicidal ideations: Secondary | ICD-10-CM

## 2014-05-05 MED ORDER — ARIPIPRAZOLE 2 MG PO TABS
2.0000 mg | ORAL_TABLET | Freq: Every day | ORAL | Status: DC
  Administered 2014-05-05 – 2014-05-08 (×4): 2 mg via ORAL
  Filled 2014-05-05 (×2): qty 1
  Filled 2014-05-05 (×2): qty 4
  Filled 2014-05-05 (×2): qty 1

## 2014-05-05 NOTE — Progress Notes (Signed)
Patient ID: [name], female   DOB: 1970-10-17, 43 y.o.   MRN: [id number] D)  Has been bright, pleasant, states her day has gotten better as the day has gone on, but had a rough start.  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 It [place] a weekend in the [place] and is a great get-away.  Talked about finding something near the beach which she would enjoy even more, and would be something her daughters would enjoy.  Pleasant, cooperative, compliant denies thoughts of self harm. A)  Continue to offer support, encouragement, continue to monitor for safety R)  Safety maintained.

## 2014-05-05 NOTE — H&P (Signed)
Psychiatric Admission Assessment Adult  Patient Identification:  [name] Date of Evaluation:  05/05/2014 Chief Complaint:  "I wanted to shoot myself because of all the stress that is going on."  History of Present Illness::  [name] is a 43 year old female who presented voluntarily to [facility] with complaint of suicidal thoughts, depression, and anxiety. Patient reports going to the [facility] for these problems and was referred to the ED for evaluation. The patient reports multiple stressors over the last year. Patient states during her psychiatric assessment "I just let the stress get to me. I made some poor choices. I tried to get my hands on a gun. I think I would have used it if I could have. But I got distracted by people coming home. Since my husband would not give me one I tried to get my mother's. It's just been a rough year. My daughter and my father had major health issues. 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 Then I found out my husband was having an affair. I guess I just kept pushing through. I am used to taking are of other people. Then my husband told me he was leaving me on Mother's Day. And my children told me they were happier with their father because he had a real family with his girlfriend. I think that is really what sent me over the edge. We were always a close family. I miss that too. I'm just not myself lately. I don't even want to work. 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 My Primary Care Provider let me know that she is not comfortable prescribing my medications anymore. I really am so depressed. But I don't feel like hurting myself anymore." The patient was very tearful during the interview when talking about her stressors. She also appeared very anxious, which the patient attributed to being in a psychiatric hospital for the first time.   Elements:  Location:  [place] adult unit  . Quality:  Depression, Anxiety, Suicidal thoughts. Severity:  Severe . Timing:  Progressing over the last week . Duration:  "Stress started building last year." . Context:  Medical problems, marital issues, multiple stressors . Associated Signs/Synptoms: Depression Symptoms:  depressed mood, anhedonia, psychomotor agitation, fatigue, feelings of worthlessness/guilt, difficulty concentrating, hopelessness, recurrent thoughts of death, suicidal thoughts with specific plan, anxiety, panic attacks, loss of energy/fatigue, disturbed sleep, (Hypo) Manic Symptoms: Denies Anxiety Symptoms:  Excessive Worry, Panic Symptoms, Psychotic Symptoms:  Denies PTSD Symptoms: Negative Total Time spent with patient: 1 hour  Psychiatric Specialty Exam: Physical Exam  Constitutional:  Physical exam findings reviewed from the [facility] and I concur with no noted exceptions.   Psychiatric: Her speech is normal and behavior is normal. Judgment and thought content normal. Her mood appears anxious. Cognition and memory are normal. She exhibits a depressed mood.    Review of Systems  Constitutional: Positive for malaise/fatigue.  HENT: Negative.   Eyes: Negative.   Respiratory: Negative.   Cardiovascular: Negative.   Gastrointestinal: Negative.   Genitourinary: Positive for dysuria.       Currently on Cipro for treatment of UTI.   Musculoskeletal: Negative.   Skin: Negative.   Neurological: Negative.   Endo/Heme/Allergies: Negative.   Psychiatric/Behavioral: Positive for depression and suicidal ideas. The patient is nervous/anxious and has insomnia.     Blood pressure 100/71, pulse 90, temperature 97.8 F (36.6 C), temperature source  Oral, resp. rate 16, height '5\' 3"'  (1.6 m), weight 71.668 kg (158 lb), SpO2 99.00%.Body mass index is 28 kg/(m^2).  General Appearance: Casual  Eye Contact::  Good  Speech:  Clear and Coherent  Volume:  Decreased  Mood:  Anxious   Affect:  Depressed and Tearful  Thought Process:  Goal Directed and Intact  Orientation:  Full (Time, Place, and Person)  Thought Content:  Symptoms, Worries, Concerns   Suicidal Thoughts:  Yes.  without intent/plan-at this time denies plan or intention of hurting herself and contracts for safety on the unit   Homicidal Thoughts:  No  Memory:  Immediate;   Good Recent;   Good Remote;   Good  Judgement:  Fair  Insight:  Present  Psychomotor Activity:  Increased and Restlessness  Concentration:  Good  Recall:  Good  Fund of Knowledge:Good  Language: Good  Akathisia:  No  Handed:  Right  AIMS (if indicated):     Assets:  Communication Skills Desire for Improvement Financial Resources/Insurance Housing Leisure Time Resilience Social Support Talents/Skills  Sleep:      Musculoskeletal: Strength & Muscle Tone: within normal limits Gait & Station: normal Patient leans: N/A  Past Psychiatric History: Diagnosis:   Hospitalizations: Denies  Outpatient Care:Denies  Substance Abuse Care:Denies  Self-Mutilation: Denies  Suicidal Attempts: Denies  Violent Behaviors: Denies   Past Medical History:   Past Medical History  Diagnosis Date  . Anxiety   . Asthma   . GERD (gastroesophageal reflux disease)   . 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 Allergy     s/p allergy testing; allergic to dogs, cats, molds, horses.  . Depression    None. Allergies:   Allergies  Allergen Reactions  . Ativan [Lorazepam] Other (See Comments)    Hyperactivity per patient  . Erythromycin Base Nausea And Vomiting  . Nitrofurantoin     REACTION: Nausea and vomiting   PTA Medications: Prescriptions prior to admission  Medication Sig Dispense Refill  . albuterol (PROVENTIL HFA;VENTOLIN HFA) 108 (90 BASE) MCG/ACT inhaler Inhale 2 puffs into the lungs every 6 (six) hours as needed for wheezing or shortness of breath.      . dexlansoprazole (DEXILANT) 60 MG capsule  Take 60 mg by mouth daily.      . Fluticasone-Salmeterol (ADVAIR) 250-50 MCG/DOSE AEPB Inhale 1 puff into the lungs daily.      . folic acid (FOLVITE) 1 MG tablet Take 1 mg by mouth daily.      Marland Kitchen gentamicin ointment (GARAMYCIN) 0.1 % Apply 1 application topically 2 (two) times daily.      Marland Kitchen ibuprofen (ADVIL,MOTRIN) 200 MG tablet Take 600 mg by mouth every 6 (six) hours as needed for moderate pain.      . methotrexate (RHEUMATREX) 2.5 MG tablet Take 20 mg by mouth once a week. Caution:Chemotherapy. Protect from light.      . montelukast (SINGULAIR) 10 MG tablet Take 10 mg by mouth daily.       . predniSONE (DELTASONE) 5 MG tablet Take 5 mg by mouth daily with breakfast.      . venlafaxine XR (EFFEXOR-XR) 150 MG 24 hr capsule Take 150 mg by mouth daily with breakfast. Takes 150 mg in the morning and if needed 75 mg in the afternoon      . venlafaxine XR (EFFEXOR-XR) 75 MG 24 hr capsule Take 75 mg by mouth daily as needed. Depression      . LORazepam (ATIVAN)  0.5 MG tablet Take 0.5 mg by mouth every 8 (eight) hours as needed for anxiety or sleep.         Previous Psychotropic Medications:  Medication/Dose  Effexor  Ativan-"Makes me hyper"             Substance Abuse History in the last 12 months:  Yes.   Patient reports drinking more alcohol over the last few weeks on a daily basis to cope with her increased level of stress. Denies any withdrawal symptoms or past history of substance abuse.   Consequences of Substance Abuse: Possible worsening of depressive symptoms   Social History:  reports that she has never smoked. She has never used smokeless tobacco. She reports that she drinks alcohol. She reports that she does not use illicit drugs. Additional Social History: Pain Medications: none History of alcohol / drug use?: No history of alcohol / drug abuse                    Current Place of Residence:   Place of Birth:   Family Members: Marital Status:   Married Children:2  Sons:  Daughters: Relationships: Education:  Dentist Problems/Performance: Religious Beliefs/Practices: History of Abuse (Emotional/Phsycial/Sexual) Occupational Experiences; Military History:  None. Legal History: Denies Hobbies/Interests: Spending time with family   Family History:   Family History  Problem Relation Age of Onset  . Asthma Mother   . Depression Mother   . Diabetes Mother   . Hyperlipidemia Mother   . Hypertension Mother   . Mental illness Mother     Results for orders placed during the hospital encounter of 05/03/14 (from the past 72 hour(s))  URINE RAPID DRUG SCREEN (HOSP PERFORMED)     Status: None   Collection Time    05/03/14  7:08 PM      Result Value Ref Range   Opiates NONE DETECTED  NONE DETECTED   Cocaine NONE DETECTED  NONE DETECTED   Benzodiazepines NONE DETECTED  NONE DETECTED   Amphetamines NONE DETECTED  NONE DETECTED   Tetrahydrocannabinol NONE DETECTED  NONE DETECTED   Barbiturates NONE DETECTED  NONE DETECTED   Comment:            DRUG SCREEN FOR MEDICAL PURPOSES     ONLY.  IF CONFIRMATION IS NEEDED     FOR ANY PURPOSE, NOTIFY LAB     WITHIN 5 DAYS.                LOWEST DETECTABLE LIMITS     FOR URINE DRUG SCREEN     Drug Class       Cutoff (ng/mL)     Amphetamine      1000     Barbiturate      200     Benzodiazepine   301     Tricyclics       601     Opiates          300     Cocaine          300     THC              50  ACETAMINOPHEN LEVEL     Status: None   Collection Time    05/03/14  7:12 PM      Result Value Ref Range   Acetaminophen (Tylenol), Serum <15.0  10 - 30 ug/mL   Comment:            THERAPEUTIC CONCENTRATIONS VARY  SIGNIFICANTLY. A RANGE OF 10-30     ug/mL MAY BE AN EFFECTIVE     CONCENTRATION FOR MANY PATIENTS.     HOWEVER, SOME ARE BEST TREATED     AT CONCENTRATIONS OUTSIDE THIS     RANGE.     ACETAMINOPHEN CONCENTRATIONS     >150 ug/mL AT 4 HOURS AFTER      INGESTION AND >50 ug/mL AT 12     HOURS AFTER INGESTION ARE     OFTEN ASSOCIATED WITH TOXIC     REACTIONS.  CBC     Status: Abnormal   Collection Time    05/03/14  7:12 PM      Result Value Ref Range   WBC 9.8  4.0 - 10.5 K/uL   RBC 3.91  3.87 - 5.11 MIL/uL   Hemoglobin 12.1  12.0 - 15.0 g/dL   HCT 36.4  36.0 - 46.0 %   MCV 93.1  78.0 - 100.0 fL   MCH 30.9  26.0 - 34.0 pg   MCHC 33.2  30.0 - 36.0 g/dL   RDW 16.6 (*) 11.5 - 15.5 %   Platelets 340  150 - 400 K/uL  COMPREHENSIVE METABOLIC PANEL     Status: None   Collection Time    05/03/14  7:12 PM      Result Value Ref Range   Sodium 139  137 - 147 mEq/L   Potassium 4.1  3.7 - 5.3 mEq/L   Chloride 103  96 - 112 mEq/L   CO2 24  19 - 32 mEq/L   Glucose, Bld 94  70 - 99 mg/dL   BUN 12  6 - 23 mg/dL   Creatinine, Ser 0.66  0.50 - 1.10 mg/dL   Calcium 9.1  8.4 - 10.5 mg/dL   Total Protein 6.6  6.0 - 8.3 g/dL   Albumin 3.5  3.5 - 5.2 g/dL   AST 22  0 - 37 U/L   ALT 12  0 - 35 U/L   Alkaline Phosphatase 64  39 - 117 U/L   Total Bilirubin 0.4  0.3 - 1.2 mg/dL   GFR calc non Af Amer >90  >90 mL/min   GFR calc Af Amer >90  >90 mL/min   Comment: (NOTE)     The eGFR has been calculated using the CKD EPI equation.     This calculation has not been validated in all clinical situations.     eGFR's persistently <90 mL/min signify possible Chronic Kidney     Disease.   Anion gap 12  5 - 15  ETHANOL     Status: None   Collection Time    05/03/14  7:12 PM      Result Value Ref Range   Alcohol, Ethyl (B) <11  0 - 11 mg/dL   Comment:            LOWEST DETECTABLE LIMIT FOR     SERUM ALCOHOL IS 11 mg/dL     FOR MEDICAL PURPOSES ONLY  SALICYLATE LEVEL     Status: Abnormal   Collection Time    05/03/14  7:12 PM      Result Value Ref Range   Salicylate Lvl <1.0 (*) 2.8 - 20.0 mg/dL  URINALYSIS, ROUTINE W REFLEX MICROSCOPIC     Status: Abnormal   Collection Time    05/04/14 12:30 AM      Result Value Ref Range   Color, Urine YELLOW   YELLOW   APPearance CLOUDY (*) CLEAR  Specific Gravity, Urine 1.029  1.005 - 1.030   pH 6.5  5.0 - 8.0   Glucose, UA NEGATIVE  NEGATIVE mg/dL   Hgb urine dipstick NEGATIVE  NEGATIVE   Bilirubin Urine NEGATIVE  NEGATIVE   Ketones, ur NEGATIVE  NEGATIVE mg/dL   Protein, ur NEGATIVE  NEGATIVE mg/dL   Urobilinogen, UA 0.2  0.0 - 1.0 mg/dL   Nitrite NEGATIVE  NEGATIVE   Leukocytes, UA MODERATE (*) NEGATIVE  URINE MICROSCOPIC-ADD ON     Status: Abnormal   Collection Time    05/04/14 12:30 AM      Result Value Ref Range   WBC, UA 7-10  <3 WBC/hpf   Bacteria, UA FEW (*) RARE   Urine-Other MUCOUS PRESENT     Psychological Evaluations:  Assessment:   DSM5:  AXIS I:  Major Depression, Recurrent severe AXIS II:  Deferred AXIS III:   Past Medical History  Diagnosis Date  . Anxiety   . Asthma   . GERD (gastroesophageal reflux disease)   . 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 Allergy     s/p allergy testing; allergic to dogs, cats, molds, horses.  . Depression    AXIS IV:  occupational problems, other psychosocial or environmental problems and problems with primary support group AXIS V:  41-50 serious symptoms  Treatment Plan/Recommendations:   1. Admit for crisis management and stabilization. Estimated length of stay 5-7 days. 2. Medication management to reduce current symptoms to base line and improve the patient's level of functioning.  3. Develop treatment plan to decrease risk of relapse upon discharge of depressive symptoms and the need for readmission. 5. Group therapy to facilitate development of healthy coping skills to use for depression and anxiety. 6. Health care follow up as needed for medical problems.  7. Discharge plan to include therapy to help patient cope with  stressors.  8. Call for Consult with Hospitalist for additional specialty patient services as needed.   Treatment Plan Summary: Daily contact with patient to assess and  evaluate symptoms and progress in treatment Medication management Current Medications:  Current Facility-Administered Medications  Medication Dose Route Frequency Provider Last Rate Last Dose  . acetaminophen (TYLENOL) tablet 650 mg  650 mg Oral Q6H PRN Waylan Boga, NP   650 mg at 05/04/14 1904  . albuterol (PROVENTIL HFA;VENTOLIN HFA) 108 (90 BASE) MCG/ACT inhaler 2 puff  2 puff Inhalation Q6H PRN Waylan Boga, NP      . alum & mag hydroxide-simeth (MAALOX/MYLANTA) 200-200-20 MG/5ML suspension 30 mL  30 mL Oral Q4H PRN Waylan Boga, NP      . ciprofloxacin (CIPRO) tablet 500 mg  500 mg Oral BID Waylan Boga, NP   500 mg at 05/05/14 0933  . folic acid (FOLVITE) tablet 1 mg  1 mg Oral Daily Waylan Boga, NP   1 mg at 05/05/14 0934  . magnesium hydroxide (MILK OF MAGNESIA) suspension 30 mL  30 mL Oral Daily PRN Waylan Boga, NP      . Derrill Memo ON 05/10/2014] methotrexate (RHEUMATREX) tablet 20 mg  20 mg Oral Weekly Waylan Boga, NP      . mometasone-formoterol (DULERA) 100-5 MCG/ACT inhaler 2 puff  2 puff Inhalation BID Waylan Boga, NP   2 puff at 05/05/14 0933  . montelukast (SINGULAIR) tablet 10 mg  10 mg Oral Daily Waylan Boga, NP   10 mg at 05/05/14 0933  . ondansetron (ZOFRAN) tablet 4 mg  4 mg Oral Q8H  PRN Waylan Boga, NP      . pantoprazole (PROTONIX) EC tablet 40 mg  40 mg Oral Daily Waylan Boga, NP   40 mg at 05/05/14 0934  . predniSONE (DELTASONE) tablet 5 mg  5 mg Oral Q breakfast Waylan Boga, NP   5 mg at 05/05/14 0935  . venlafaxine XR (EFFEXOR-XR) 24 hr capsule 150 mg  150 mg Oral Q breakfast Waylan Boga, NP      . zolpidem (AMBIEN) tablet 5 mg  5 mg Oral QHS PRN Waylan Boga, NP   5 mg at 05/05/14 0934    Observation Level/Precautions:  15 minute checks  Laboratory:  CBC Chemistry Profile UDS UA  Psychotherapy:  Individual and Group Therapy  Medications:  Start Abilify 2 mg daily, Continue Effexor as ordered   Consultations:  As needed  Discharge Concerns:  Safety and  Stability   Estimated LOS: 5-7 days   Other:  Increase collateral information    I certify that inpatient services furnished can reasonably be expected to improve the patient's condition.   Elmarie Shiley NP-C 10/31/201512:03 PM   Patient case reviewed with me as above. I also met with patient. Agree with NP Assessment and Plan Patient is a 43 year old female admitted due to worsening depression, anxiety, and emergence of SI. 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 Will continue Effexor XR and add Abilify as augmentation.

## 2014-05-05 NOTE — BHH Counselor (Signed)
Adult Comprehensive Assessment  Patient ID: [name], female   DOB: 12/29/1970, 43 y.o.   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 rather than being here."  Family Relationships: June 2015 separation from husband; withdrawing from family of origin and jealousy over Radio broadcast assistant / Lack of resources (include bankruptcy): Some strain due to lack of financial agreements since separation, therapy bills for children and attorney fees Housing / Lack of housing: NA Physical health (include injuries & life threatening diseases): Recent diagnosis of Wagner's Disease (auto immune)  Social relationships: Tends to isolate Substance abuse: Daily drinking for 1-2 months Bereavement / Loss: Separation in marriage of 22 years  Living/Environment/Situation:  Living Arrangements: Children Living conditions (as described by patient or guardian): Somewhat depressing as moved into deceased grandmother's home 8 months ago after she and husband sold their home of 12 years and were planning to build. Husband moved out after 8 months How long has patient lived in current situation?: 8 months What is atmosphere in current home: Comfortable;Loving  Family History:  Marital status: Separated Separated, when?: June 2015 in marriage of 60 years What types of issues is patient dealing with in the relationship?: Infidelity on husband's part; lack of financial support; children wanting to spend more time (split evenly) between mom and dad's and stating they feel like dad's is more of a 'real family' Additional relationship information: Oldest daughter had hip surgery and was out of school for 16 weeks, mother/patient main care giver Does patient have children?: Yes How many children?: 2 How is patient's  relationship with their children?: Two daughters ages 74 and 46; some hurt as daughters have expressed desire to spend half the time with father and his SO  Childhood History:  By whom was/is the patient raised?: Adoptive parents Additional childhood history information: Patient reports she was adopted at age of 43 YO and feels she had all needs meet in the adoptive home; does not recall where she was until age 89 Description of patient's relationship with caregiver when they were a child: Good with both Patient's description of current relationship with people who raised him/her: Remains good with both; patient speaks much more about her father than mother; parents unaware of separation Does patient have siblings?: Yes Number of Siblings: 1 Description of patient's current relationship with siblings: Good, although pt reports she doesn't disclose much to sister Did patient suffer any verbal/emotional/physical/sexual abuse as a child?: No Did patient suffer from severe childhood neglect?: No Has patient ever been sexually abused/assaulted/raped as an adolescent or adult?: No Was the patient ever a victim of a crime or a disaster?: No Witnessed domestic violence?: No Has patient been effected by domestic violence as an adult?: No  Education:  Highest grade of school patient has completed: 34; masters in Education officer, museum  Currently a Ship broker?: No Learning disability?: No  Employment/Work Situation:   Employment situation: Employed Where is patient currently employed?: Designer, fashion/clothing  How long has patient been employed?: 3 years Patient's job has been impacted by current illness: No What is the longest time patient has a held a job?: 12 Where was the patient employed at that time?: [employer] Has patient ever been in the [employer]?: No Has patient ever served in Recruitment consultant?: No  Financial Resources:   Museum/gallery curator resources: Income from  employment Does patient have a representative  payee or guardian?: No  Alcohol/Substance Abuse:   What has been your use of drugs/alcohol within the last 12 months?: Patient reports daily drinking over last 1-2 months; usually one glass of wine at night; [place]84 alcoholic beverages per day Alcohol/Substance Abuse Treatment Hx: Denies past history Has alcohol/substance abuse ever caused legal problems?: No  Social Support System:   Heritage manager System: Fair Astronomer System: One support person in school system and sister Type of faith/religion: Darrick Meigs How does patient's faith help to cope with current illness?: NA  Leisure/Recreation:   Leisure and Hobbies: Has given pretty much all of the things she enjoyed up and is merely functioning as per report. Enjoyed reading and trips to gym in the past.  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, employee In what areas does patient struggle / problems for patient: "Being available for everyone"; patient judgmental of self throughout assessment  Discharge Plan:   Does patient have access to transportation?: Yes Will patient be returning to same living situation after discharge?: Yes Currently receiving community mental health services: No If no, would patient like referral for services when discharged?: Yes (What county?) (Important to note there are financial barriers; pt currently stressed paying for therapy for daughters due to separation) Does patient have financial barriers related to discharge medications?: No  Summary/Recommendations:   Summary and Recommendations (to be completed by the evaluator): Patient is 43 YO employed separated Caucasian female admitted with diagnosis of Anxiety Disorder and Major Depression, Single Episode.  Patient would benefit from crisis stabilization, medication evaluation, therapy groups for processing thoughts/feelings/experiences, psycho ed groups for  increasing coping skills, and aftercare planning. Discharge Process and Patient Expectations information sheet signed by patient, witnessed by writer and inserted in patient's shadow chart.   Lyla Glassing. 05/05/2014

## 2014-05-05 NOTE — Progress Notes (Signed)
[facility] Group Notes:  (Nursing/MHT/Case Management/Adjunct)  Date:  05/05/2014  Time:  10:11 PM  Type of Therapy:  Psychoeducational Skills  Participation Level:  Active  Participation Quality:  Appropriate  Affect:  Appropriate  Cognitive:  Appropriate  Insight:  Good  Engagement in Group:  Engaged  Modes of Intervention:  Education  Summary of Progress/Problems: The patient verbalized in group that she had a bad morning and that her day improved in the afternoon. She credited her positive afternoon to being able to speak with her children and because she was able to "acclimate" herself to the unit. As a theme for the day, her coping skills will be as follows: go to the gym and "acknowledge" when she is stressed out.   [name] S 05/05/2014, 10:11 PM

## 2014-05-05 NOTE — Progress Notes (Signed)
Patient attended [name] meeting. Tonight we had a speaker meeting. They actively listened. She was cooperative and pleasant on the unit tonight. At the beginning of the shift, the patient was tearful, but as the night progressed, she opened up and became brighter. 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  [name] A  1:10 AM

## 2014-05-05 NOTE — [facility] Group Notes (Signed)
[facility] LCSW Group Therapy  05/05/2014 3:44 PM  Type of Therapy:  Group Therapy  Participation Level:  Active  Participation Quality:  Attentive  Affect:  Appropriate  Cognitive:  Appropriate  Insight:  Engaged  Engagement in Therapy:  Engaged  Modes of Intervention:  Discussion  Summary of Progress/Problems: Group today was building coping skills by participants and how to use those coping mechanisms. Group began by defining coping mechanisms and then identify historical examples. There were multiple examples shared that others could find useful as a coping mechanism with tools for application. Patient was able to share how she used her coping mechanisms and learn from the coping mechanisms of the other group members.   Christene Lye MSW, LCSW   Lyla Glassing 05/05/2014, 3:44 PM

## 2014-05-05 NOTE — [facility]HH Suicide Risk Assessment (Signed)
   Nursing information obtained from:    Demographic factors:   76 year lold married female, currently separated, has two children Current Mental Status:   See below Loss Factors:   recent separation, daughter has medical illness, patient has Autoimmune disorder Historical Factors:    History of Depression Risk Reduction Factors:   Sense of responsibility to family Total Time spent with patient: 45 minutes  CLINICAL FACTORS:  Depression, recent suicidal ideations, in the context of severe psychosocial stressors and chronic medical illness   Psychiatric Specialty Exam: Physical Exam  ROS  Blood pressure 100/71, pulse 90, temperature 97.8 F (36.6 C), temperature source Oral, resp. rate 16, height 5\' 3"  (1.6 m), weight 71.668 kg (158 lb), SpO2 99.00%.Body mass index is 28 kg/(m^2).  General Appearance: Well Groomed  Engineer, water::  Good  Speech:  Normal Rate  Volume:  Normal  Mood:  Depressed  Affect:  Constricted and Tearful  Thought Process:  Linear  Orientation:  Full (Time, Place, and Person)  Thought Content:  Rumination about her stressors, no hallucinations, no delusions  Suicidal Thoughts:  Yes.  without intent/plan- at this time denies plan or intention of hurting herself and contracts for safety on the unit  Homicidal Thoughts:  No  Memory:  recent and remote grossly intact   Judgement:  Fair  Insight:  Good  Psychomotor Activity:  Normal  Concentration:  Good  Recall:  Good  Fund of Knowledge:Good  Language: Good  Akathisia:  Negative  Handed:  Right  AIMS (if indicated):     Assets:  Communication Skills Desire for Improvement Resilience  Sleep:      Musculoskeletal: Strength & Muscle Tone: within normal limits Gait & Station: normal Patient leans: N/A  COGNITIVE FEATURES THAT CONTRIBUTE TO RISK:  Closed-mindedness    SUICIDE RISK:   Moderate:  Frequent suicidal ideation with limited intensity, and duration, some specificity in terms of plans, no  associated intent, good self-control, limited dysphoria/symptomatology, some risk factors present, and identifiable protective factors, including available and accessible social support.  PLAN OF CARE: Patient will be admitted to inpatient psychiatric unit for stabilization and safety. Will provide and encourage milieu participation. Provide medication management and maked adjustments as needed.  Will follow daily.    I certify that inpatient services furnished can reasonably be expected to improve the patient's condition.  COBOS, [place] 05/05/2014, 12:48 PM

## 2014-05-06 MED ORDER — INFLUENZA VAC SPLIT QUAD 0.5 ML IM SUSY
0.5000 mL | PREFILLED_SYRINGE | INTRAMUSCULAR | Status: AC
  Administered 2014-05-06: 0.5 mL via INTRAMUSCULAR
  Filled 2014-05-06: qty 0.5

## 2014-05-06 NOTE — Progress Notes (Signed)
[facility] MD Progress Note  05/06/2014 2:09 PM [name]  MRN:  [id number] Subjective: Patient states she is feeling significantly better and less depressed. She attributes this in part to being able to speak with her daughters on the phone, which she states helped her feel more supported. Objective:  Patient has been going to groups and participating in milieu. Although stuill depressed, she is presenting with a fuller range of affect, and is interacting appropriately with peers and staff. She is tolerating medications well and at present is denying side effects. She does feel that Steroid management contributes partially to mood / anxiety symptoms, but she is reluctant to stop , due to her autoimmune disorder ( Wegener's)  She states she is feeling better about herself, and slept better last night.  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  HENT: Negative for nosebleeds.   Respiratory: Negative for cough and hemoptysis.   Cardiovascular: Negative for chest pain.  Gastrointestinal: Negative for vomiting.  Psychiatric/Behavioral: Positive for depression. Negative for suicidal ideas.    Blood pressure 121/84, pulse 91, temperature 98.3 F (36.8 C), temperature source Oral, resp. rate 16, height 5\' 3"  (1.6 m), weight 71.668 kg (158 lb), SpO2 99 %.Body mass index is 28 kg/(m^2).  General Appearance: Well Groomed  Engineer, water::  Good  Speech:  Normal Rate  Volume:  Normal  Mood:  improved, less depressed  Affect:  Appropriate and more reactive  Thought Process:  Goal Directed and Linear  Orientation:  Full (Time, Place, and Person)  Thought Content:  denies  hallucinations, no delusions, less ruminative about stressors  Suicidal Thoughts:  No- at this time denies any suicidal ideations and contracts for safety on the unit   Homicidal Thoughts:  No  Memory:  recent and remote grossly intact   Judgement:  Good  Insight:  Good  Psychomotor Activity:  Normal  Concentration:  Good  Recall:  Good  Fund of Knowledge:Good  Language: Good  Akathisia:  Negative  Handed:  Right  AIMS (if indicated):     Assets:  Communication Skills Desire for Improvement Resilience  Sleep:  Number of Hours: 4.25   Musculoskeletal: Strength & Muscle Tone: within normal limits Gait & Station: normal Patient leans: N/A  Current Medications: Current Facility-Administered Medications  Medication Dose Route Frequency Provider Last Rate Last Dose  . acetaminophen (TYLENOL) tablet 650 mg  650 mg Oral Q6H PRN Waylan Boga, NP   650 mg at 05/04/14 1904  . albuterol (PROVENTIL HFA;VENTOLIN HFA) 108 (90 BASE) MCG/ACT inhaler 2 puff  2 puff Inhalation Q6H PRN Waylan Boga, NP   2 puff at 05/05/14 1255  . alum & mag hydroxide-simeth (MAALOX/MYLANTA) 200-200-20 MG/5ML suspension 30 mL  30 mL Oral Q4H PRN Waylan Boga, NP      . ARIPiprazole (ABILIFY) tablet 2 mg  2 mg Oral Daily Elmarie Shiley, NP   2 mg at 05/06/14 0807  . ciprofloxacin (CIPRO) tablet 500 mg  500 mg Oral BID Waylan Boga, NP   500 mg at 05/06/14 0807  . folic acid (FOLVITE) tablet 1 mg  1 mg Oral Daily Waylan Boga, NP   1 mg at  05/06/14 0807  . magnesium hydroxide (MILK OF MAGNESIA) suspension 30 mL  30 mL Oral Daily PRN Waylan Boga, NP      . Derrill Memo ON 05/10/2014] methotrexate (RHEUMATREX) tablet 20 mg  20 mg Oral Weekly Waylan Boga, NP      . mometasone-formoterol (DULERA) 100-5 MCG/ACT inhaler 2 puff  2 puff Inhalation BID Waylan Boga, NP   2 puff at 05/06/14 0807  . montelukast (SINGULAIR) tablet 10 mg  10 mg Oral Daily Waylan Boga, NP   10 mg at 05/06/14 0807  . ondansetron (ZOFRAN) tablet 4 mg  4 mg  Oral Q8H PRN Waylan Boga, NP      . pantoprazole (PROTONIX) EC tablet 40 mg  40 mg Oral Daily Waylan Boga, NP   40 mg at 05/06/14 0807  . predniSONE (DELTASONE) tablet 5 mg  5 mg Oral Q breakfast Waylan Boga, NP   5 mg at 05/06/14 0807  . venlafaxine XR (EFFEXOR-XR) 24 hr capsule 150 mg  150 mg Oral Q breakfast Waylan Boga, NP   150 mg at 05/06/14 3875  . zolpidem (AMBIEN) tablet 5 mg  5 mg Oral QHS PRN Waylan Boga, NP   5 mg at 05/05/14 2218    Lab Results: No results found for this or any previous visit (from the past 48 hour(s)).  Physical Findings: AIMS: Facial and Oral Movements Muscles of Facial Expression: None, normal Lips and Perioral Area: None, normal Jaw: None, normal Tongue: None, normal,Extremity Movements Upper (arms, wrists, hands, fingers): None, normal Lower (legs, knees, ankles, toes): None, normal, Trunk Movements Neck, shoulders, hips: None, normal, Overall Severity Severity of abnormal movements (highest score from questions above): None, normal Incapacitation due to abnormal movements: None, normal Patient's awareness of abnormal movements (rate only patient's report): No Awareness, Dental Status Current problems with teeth and/or dentures?: No Does patient usually wear dentures?: No  CIWA:  CIWA-Ar Total: 1 COWS:  COWS Total Score: 0   Assessment: Significant improvement compared to admission- less depressed, less constricted in affect, less ruminative. Tolerating medications well at this time.  Treatment Plan Summary: Daily contact with patient to assess and evaluate symptoms and progress in treatment Medication management See below  Plan: Continue inpatient treatment - continue to provide support/milieu Continue Effexor XR 150 mgrs QAM Continue Abilify 2 mgrs QDAY  Continue Ambien 5 mgrs PRN Insomnia  Medical Decision Making Problem Points:  Established problem, stable/improving (1), Review of last therapy session (1) and Review of psycho-social  stressors (1) Data Points:  Review or order clinical lab tests (1) Review of medication regiment & side effects (2)  I certify that inpatient services furnished can reasonably be expected to improve the patient's condition.   Banjamin Stovall, [place] 05/06/2014, 2:09 PM

## 2014-05-06 NOTE — [facility] Group Notes (Signed)
[place] LCSW Group Therapy 05/06/2014   Type of Therapy: Group Therapy- Feelings Around Discharge & Establishing a Supportive Framework  Participation Level: Active   Participation Quality:  Appropriate  Affect:  Flat   Cognitive: Alert and Oriented   Insight:  Developing/Improving   Engagement in Therapy: Developing/Improving and Engaged   Modes of Intervention: Clarification, Confrontation, Discussion, Education, Exploration, Limit-setting, Orientation, Problem-solving, Rapport Building, Art therapist, Socialization and Support   Description of Group:   What is a supportive framework? What does it look like feel like and how do I discern it from and unhealthy non-supportive network? Learn how to cope when supports are not helpful and don't support you. Discuss what to do when your family/friends are not supportive. Pt demonstrates insight in group discussion as she is able to explore the aspects of a positive support in a meaningful way. Pt identified the need to have a support who has demonstrated success in previous treatment or with similar problems as Pt in order to receive proper feedback and advice. Pt also discussed the need for a support to be patient with the recovery process as it often involves relapse. Pt discussed apprehension related to discharge due to going back to the same stressors she was experiencing prior to hospitalization. She also discussed enabling behaviors as characteristic of an unhealthy relationship. Pt did not identify a positive support in her life.    Therapeutic Modalities:   Cognitive Behavioral Therapy Person-Centered Therapy Motivational Interviewing   [name], [place] 05/06/2014 11:24 AM

## 2014-05-06 NOTE — Progress Notes (Signed)
.  Psychoeducational Group Note  Late entry for 05-05-14 at 0900  Date: 05/06/2014 Time:  0900   Goal Setting Purpose of Group: To be able to set a goal that is measurable and that can be accomplished in one day Participation Level:  Active  Participation Quality:  Appropriate  Affect:  Depressed  Cognitive:  Oriented  Insight:  Improving  Engagement in Group:  Engaged  Additional Comments:   Paulino Rily

## 2014-05-06 NOTE — Progress Notes (Signed)
D: Patient tearful on approach.  Patient states she had just gotten off the phone with her 44 year old daughter and states that her daughter is mad at her.  Patient was visibly upset.  Patient states she felt she has been progressing but states that was a bad phone call.  Patient states she has been taking it one day at a time.  Patient denies SI/HI and denies AVH. A: Staff to monitor Q 15 mins for safety.  Encouragement and support offered.  Scheduled medications administered per orders.  Ambien administered prn for sleep. R: Patient remains safe on the unit.  Patient attended group tonight.  Patient visible on the unit.  Patient taking administered medications.

## 2014-05-06 NOTE — Progress Notes (Signed)
Psychoeducational Group Note  Late entry on 05-06-2014 for 05-05-2014  Date:05/05/2014  Time:  1015  Group Topic/Focus:  Identifying Needs:   The focus of this group is to help patients identify their personal needs that have been historically problematic and identify healthy behaviors to address their needs.  Participation Level:  Active  Participation Quality:  Appropriate  Affect:  Appropriate  Cognitive:  Oriented  Insight:  Improving  Engagement in Group:  Engaged  Additional Comments:  [name]

## 2014-05-06 NOTE — Progress Notes (Signed)
D Pt is seen OOB UAL on the 300 hall today. She is articulate.Composed. She makes direct eye contact. She is still.  A SHe takes all of her scheduled meds as ordered and she denies need for prn's. She attends her groups as planned and she completed her morning assessment and  On it she wrote she denied SI and rated her depression, hopelessness and anxiety "3/3/3".  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 This nurse will speak with CN and then f/u with pt. Safety in place.

## 2014-05-06 NOTE — Progress Notes (Signed)
Psychoeducational Group Note  Date: 05/06/2014 Time:  1015  Group Topic/Focus:  Identifying Needs:   The focus of this group is to help patients identify their personal needs that have been historically problematic and identify healthy behaviors to address their needs.  Participation Level:  Active  Participation Quality:  Appropriate  Affect:  Appropriate  Cognitive:  Oriented  Insight:  Improving  Engagement in Group:  Engaged  Additional Comments:    Paulino Rily

## 2014-05-07 DIAGNOSIS — F411 Generalized anxiety disorder: Secondary | ICD-10-CM

## 2014-05-07 DIAGNOSIS — F332 Major depressive disorder, recurrent severe without psychotic features: Secondary | ICD-10-CM | POA: Insufficient documentation

## 2014-05-07 NOTE — Progress Notes (Signed)
Patient ID: [name], female   DOB: 1971/01/11, 43 y.o.   MRN: [id number] D: Client had visitors early this shift, seen on the phone and interacting appropriately with peers. Client reports being here and interacting with others and medication management helpful. She notes one discharge plans as getting in a divorcee' group. A: Writer introduce self to client provided emotional support, encouraged her to follow through with support measures. Staff will monitor q66min for safety. R: client is safe on unit, attended group.

## 2014-05-07 NOTE — B[facility] Group Notes (Signed)
[facility] LCSW Group Therapy 05/07/2014  1:15 pm  Type of Therapy: Group Therapy Participation Level: Active  Participation Quality: Attentive, Sharing and Supportive  Affect: Depressed and Flat  Cognitive: Alert and Oriented  Insight: Developing/Improving and Engaged  Engagement in Therapy: Developing/Improving and Engaged  Modes of Intervention: Clarification, Confrontation, Discussion, Education, Exploration,  Limit-setting, Orientation, Problem-solving, Rapport Building, Art therapist, Socialization and Support  Summary of Progress/Problems: Pt identified obstacles faced currently and processed barriers involved in overcoming these obstacles. Pt identified steps necessary for overcoming these obstacles and explored motivation (internal and external) for facing these difficulties head on. Pt further identified one area of concern in their lives and chose a goal to focus on for today. Patient shared that her primary obstacle is regaining trust with her children. Patient became tearful when discussing her relationship with her children and their resentment towards her regarding her divorce. CSW provided patient with emotional support and encouragement.  Tilden Fossa, MSW, [place] Worker [facility] [phone]

## 2014-05-07 NOTE — Clinical Social Work Note (Signed)
With patient's permission, CSW left voicemail for patient's employer. Awaiting return call.  Tilden Fossa, MSW, [place] Worker [facility] [phone]

## 2014-05-07 NOTE — B[facility] Group Notes (Signed)
   [facility] LCSW Aftercare Discharge Planning Group Note  05/07/2014  8:45 AM   Participation Quality: Alert, Appropriate and Oriented  Mood/Affect: Depressed and Flat  Depression Rating: 3  Anxiety Rating: 4  Thoughts of Suicide: Pt denies SI/HI  Will you contract for safety? Yes  Current AVH: Pt denies  Plan for Discharge/Comments: Pt attended discharge planning group and actively participated in group. CSW provided pt with today's workbook. Patient reports that she is feeling "okay" today. She lives in [place] and would like a referral for therapy and medication management at discharge. She plans to return home at discharge and requested that [place] contact her employer regarding her hospitalization.  Transportation Means: Pt reports access to transportation  Supports: No supports mentioned at this time  Tilden Fossa, MSW, [place] Social Worker [employer] [phone]

## 2014-05-07 NOTE — Progress Notes (Signed)
Patient did attend the evening speaker AA meeting.  

## 2014-05-07 NOTE — Plan of Care (Signed)
Problem: Consults Goal: Suicide Risk Patient Education (See Patient Education module for education specifics)  Outcome: Completed/Met Date Met:  05/07/14 Patient will discuss SI feeling with MD/staff, contracts for safety.  Stated she was feeling better this afternoon.

## 2014-05-07 NOTE — Progress Notes (Signed)
D:  Patient's self inventory sheet, patient slept good last night, sleep medication does help.  Good appetite, normal energy level, poor concentration.  Rated depression 5, hopeless 3, anxiety 4.  Denied withdrawals.  Denied SI.  Denied physical problems.  Goal today to work on discharge plan.  Work on Surveyor, mining.  Plans to work on interacting with teenage children and dealing with their anger.  No discharge plan.  No problems anticipated after discharge. A:  Medications administered per MD orders.  Emotional support and encouragement given patient. R:  Denied SI and HI.  Denied A/V hallucinations.  Safety maintained with 15 minute checks.

## 2014-05-07 NOTE — Progress Notes (Signed)
[facility] MD Progress Note  05/07/2014 4:49 PM [name]  MRN:  [id number] Subjective:  States she felt really overwhelmed before she came here. States she has been dealing with the separation and imminent divorce from her husband. Not wanting the kids to really know all the details of what happened with their father, feeling that she has been substituted by her husband's girlfriend and her family, the effect of the steroids on her mood, her medical illness. States she was trying to get a gun and states this is not her. She is worried and concerned. She had been on Zoloft, then Celexa and now Effexor. She was just started on Abilify  Diagnosis:   DSM5: Depressive Disorders:  Major Depressive Disorder - Severe (296.23) Total Time spent with patient: 30 minutes  Axis I: Generalized Anxiety Disorder  ADL's:  Intact  Sleep: Poor  Appetite:  Fair  Psychiatric Specialty Exam: Physical Exam  Review of Systems  Constitutional: Negative.   HENT: Negative.   Eyes: Negative.   Respiratory: Negative.   Cardiovascular: Negative.   Gastrointestinal: Positive for heartburn.  Genitourinary: Negative.   Musculoskeletal: Negative.   Skin: Negative.   Neurological: Negative.   Endo/Heme/Allergies: Negative.   Psychiatric/Behavioral: Positive for depression. The patient is nervous/anxious.     Blood pressure 119/75, pulse 86, temperature 98.8 F (37.1 C), temperature source Oral, resp. rate 16, height 5\' 3"  (1.6 m), weight 71.668 kg (158 lb), SpO2 99 %.Body mass index is 28 kg/(m^2).  General Appearance: Fairly Groomed  Engineer, water::  Fair  Speech:  Clear and Coherent  Volume:  Decreased  Mood:  Anxious and Depressed  Affect:  Depressed and Tearful  Thought Process:  Coherent and Goal Directed  Orientation:  Full (Time, Place, and Person)  Thought Content:  deals with the most recent events the building up of stress, her trying to cope  Suicidal Thoughts:  No  Homicidal Thoughts:  No   Memory:  Immediate;   Fair Recent;   Fair Remote;   Fair  Judgement:  Fair  Insight:  Present  Psychomotor Activity:  Restlessness  Concentration:  Fair  Recall:  [employer] of Knowledge:NA  Language: Fair  Akathisia:  No  Handed:    AIMS (if indicated):     Assets:  Desire for Improvement Housing Talents/Skills Vocational/Educational  Sleep:  Number of Hours: 5.75   Musculoskeletal: Strength & Muscle Tone: within normal limits Gait & Station: normal Patient leans: N/A  Current Medications: Current Facility-Administered Medications  Medication Dose Route Frequency Provider Last Rate Last Dose  . acetaminophen (TYLENOL) tablet 650 mg  650 mg Oral Q6H PRN Waylan Boga, NP   650 mg at 05/04/14 1904  . albuterol (PROVENTIL HFA;VENTOLIN HFA) 108 (90 BASE) MCG/ACT inhaler 2 puff  2 puff Inhalation Q6H PRN Waylan Boga, NP   2 puff at 05/05/14 1255  . alum & mag hydroxide-simeth (MAALOX/MYLANTA) 200-200-20 MG/5ML suspension 30 mL  30 mL Oral Q4H PRN Waylan Boga, NP      . ARIPiprazole (ABILIFY) tablet 2 mg  2 mg Oral Daily Elmarie Shiley, NP   2 mg at 05/07/14 0755  . ciprofloxacin (CIPRO) tablet 500 mg  500 mg Oral BID Waylan Boga, NP   500 mg at 05/07/14 0755  . folic acid (FOLVITE) tablet 1 mg  1 mg Oral Daily Waylan Boga, NP   1 mg at 05/07/14 0756  . magnesium hydroxide (MILK OF MAGNESIA) suspension 30 mL  30 mL Oral Daily PRN Theodoro Clock  Lord, NP      . Derrill Memo ON 05/10/2014] methotrexate (RHEUMATREX) tablet 20 mg  20 mg Oral Weekly Waylan Boga, NP      . mometasone-formoterol (DULERA) 100-5 MCG/ACT inhaler 2 puff  2 puff Inhalation BID Waylan Boga, NP   2 puff at 05/07/14 0756  . montelukast (SINGULAIR) tablet 10 mg  10 mg Oral Daily Waylan Boga, NP   10 mg at 05/07/14 0756  . ondansetron (ZOFRAN) tablet 4 mg  4 mg Oral Q8H PRN Waylan Boga, NP      . pantoprazole (PROTONIX) EC tablet 40 mg  40 mg Oral Daily Waylan Boga, NP   40 mg at 05/07/14 0757  . predniSONE (DELTASONE)  tablet 5 mg  5 mg Oral Q breakfast Waylan Boga, NP   5 mg at 05/07/14 0757  . venlafaxine XR (EFFEXOR-XR) 24 hr capsule 150 mg  150 mg Oral Q breakfast Waylan Boga, NP   150 mg at 05/07/14 0759  . zolpidem (AMBIEN) tablet 5 mg  5 mg Oral QHS PRN Waylan Boga, NP   5 mg at 05/06/14 2141    Lab Results: No results found for this or any previous visit (from the past 48 hour(s)).  Physical Findings: AIMS: Facial and Oral Movements Muscles of Facial Expression: None, normal Lips and Perioral Area: None, normal Jaw: None, normal Tongue: None, normal,Extremity Movements Upper (arms, wrists, hands, fingers): None, normal Lower (legs, knees, ankles, toes): None, normal, Trunk Movements Neck, shoulders, hips: None, normal, Overall Severity Severity of abnormal movements (highest score from questions above): None, normal Incapacitation due to abnormal movements: None, normal Patient's awareness of abnormal movements (rate only patient's report): No Awareness, Dental Status Current problems with teeth and/or dentures?: No Does patient usually wear dentures?: No  CIWA:  CIWA-Ar Total: 1 COWS:  COWS Total Score: 2  Treatment Plan Summary: Daily contact with patient to assess and evaluate symptoms and progress in treatment Medication management  Plan: Supportive approach/coping skills           CBT;mindfulness           Will pursue the Effexor at 150 mg daily and the Abilify at 2 mg (with plans to increase to 5           mg tomorrow)           She states she was extremely overwhelmed and "lost." states she needs direction what            way to go.            Does not want to get to the point of wanting to hurt herself           States she wants to get to a point in which she can return to work by Thursday and be out of here before that. 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  Medical Decision Making Problem Points:  Review of psycho-social stressors (1) Data Points:  Review of medication regiment & side  effects (2)  I certify that inpatient services furnished can reasonably be expected to improve the patient's condition.   Daymien Goth A 05/07/2014, 4:49 PM

## 2014-05-08 LAB — URINE CULTURE: Colony Count: >=100000

## 2014-05-08 MED ORDER — METHOTREXATE 2.5 MG PO TABS: 20.0000 mg | ORAL_TABLET | ORAL | Status: DC

## 2014-05-08 MED ORDER — ARIPIPRAZOLE 2 MG PO TABS: 2.0000 mg | ORAL_TABLET | Freq: Every day | ORAL | Status: DC

## 2014-05-08 MED ORDER — ZOLPIDEM TARTRATE 5 MG PO TABS: 5.0000 mg | ORAL_TABLET | Freq: Every evening | ORAL | Status: DC | PRN

## 2014-05-08 MED ORDER — FOLIC ACID 1 MG PO TABS: 1.0000 mg | ORAL_TABLET | Freq: Every day | ORAL | Status: DC

## 2014-05-08 MED ORDER — DEXLANSOPRAZOLE 60 MG PO CPDR: 60.0000 mg | DELAYED_RELEASE_CAPSULE | Freq: Every day | ORAL | Status: DC

## 2014-05-08 MED ORDER — FLUTICASONE-SALMETEROL 250-50 MCG/DOSE IN AEPB: 1.0000 | INHALATION_SPRAY | Freq: Every day | RESPIRATORY_TRACT | Status: DC

## 2014-05-08 MED ORDER — CIPROFLOXACIN HCL 500 MG PO TABS: 500.0000 mg | ORAL_TABLET | Freq: Two times a day (BID) | ORAL | Status: DC

## 2014-05-08 MED ORDER — VENLAFAXINE HCL ER 150 MG PO CP24: 150.0000 mg | ORAL_CAPSULE | Freq: Every day | ORAL | Status: DC

## 2014-05-08 MED ORDER — GENTAMICIN SULFATE 0.1 % EX OINT: 1.0000 "application " | TOPICAL_OINTMENT | Freq: Two times a day (BID) | CUTANEOUS | Status: DC

## 2014-05-08 MED ORDER — MONTELUKAST SODIUM 10 MG PO TABS: 10.0000 mg | ORAL_TABLET | Freq: Every day | ORAL | Status: DC

## 2014-05-08 MED ORDER — PREDNISONE 5 MG PO TABS: 5.0000 mg | ORAL_TABLET | Freq: Every day | ORAL | Status: DC

## 2014-05-08 NOTE — Plan of Care (Signed)
Problem: Ineffective individual coping Goal: STG: Patient will participate in after care plan 11/2: Goal not met: CSW assessing for appropriate referrals for pt and will have follow up secured prior to d/c.  Tilden Fossa, MSW, [place] Worker [facility] [phone] Patient has attended groups and easily engaged in discussions. Outpatient follow up is scheduled with [facility] . Manasseh Pittsley, LCSW 05/08/2014 10:30 AM   Outcome: Completed/Met Date Met:  05/08/14

## 2014-05-08 NOTE — Tx Team (Signed)
Interdisciplinary Treatment Plan Update   Date Reviewed:  05/08/2014  Time Reviewed:  8:37 AM  Progress in Treatment:   Attending groups: Yes Participating in groups: Yes Taking medication as prescribed: Yes  Tolerating medication: Yes Family/Significant other contact made:  Yes, collateral contact with sister. Patient understands diagnosis: Yes, patient understands diagnosis and need for treatment. Discussing patient identified problems/goals with staff: Yes, patient is able to express goals for treatment and discharge. Medical problems stabilized or resolved: Yes Denies suicidal/homicidal ideation: Yes Patient has not harmed self or others: Yes  For review of initial/current patient goals, please see plan of care.  Estimated Length of Stay:  Discharge today  Reasons for Continued Hospitalization:   New Problems/Goals identified:    Discharge Plan or Barriers:   Home with outpatient follow up with [facility]  Additional Comments:    [name] is a 43 year old female who presented voluntarily to [facility] with complaint of suicidal thoughts, depression, and anxiety. Patient reports going to the [facility] for these problems and was referred to the ED for evaluation. The patient reports multiple stressors over the last year. Patient states during her psychiatric assessment "I just let the stress get to me. I made some poor choices. I tried to get my hands on a gun. I think I would have used it if I could have. But I got distracted by people coming home. Since my husband would not give me one I tried to get my mother's. It's just been a rough year. My daughter and my father had major health issues. 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 Then I found out my husband was having an affair. I guess I just kept pushing through. I am used to taking are of other people. Then my husband told me he  was leaving me on Mother's Day. And my children told me they were happier with their father because he had a real family with his girlfriend. I think that is really what sent me over the edge. We were always a close family. I miss that too. I'm just not myself lately. I don't even want to work. 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 My Primary Care Provider let me know that she is not comfortable prescribing my medications anymore. I really am so depressed. But I don't feel like hurting myself anymore."   Patient and CSW reviewed patient's identified goals and treatment plan.  Patient verbalized understanding and agreed to treatment plan.   Attendees:  Patient:  05/08/2014 8:37 AM   Signature:  Gabriel Earing, MD 05/08/2014 8:37 AM  Signature: Carlton Adam, MD 05/08/2014 8:37 AM  Signature:  Eduard Roux, RN 05/08/2014 8:37 AM  Signature:Beverly Danelle Earthly, RN 05/08/2014 8:37 AM  Signature:   05/08/2014 8:37 AM  Signature:  Joette Catching, LCSW 05/08/2014 8:37 AM  Signature:  Erasmo Downer Drinkard, LCSW-A 05/08/2014 8:37 AM  Signature:  Lucinda Dell, Care Coordinator [facility] 05/08/2014 8:37 AM  Signature:  Marshall Cork, RN 05/08/2014 8:37 AM  Signature: Marilynne Halsted, RN 05/08/2014  8:37 AM  Signature:   Lars Pinks, RN [facility] 05/08/2014  8:37 AM  Signature:  [place] Worker LCSW 05/08/2014  8:37 AM    Scribe for Treatment Team:   [employer],  05/08/2014 8:37 AM

## 2014-05-08 NOTE — Progress Notes (Signed)
Discharge Note:  Patient discharged home with family.  Denied SI and HI.  Denied A/V hallucinations.  Denied pain.  Suicide prevention information given and discussed with patient who stated she understood and had no questions.  Patient stated she received all her belongings, clothing, toiletries, misc items, prescriptions, medications, conditioner, makeup, etc.  Patient stated she appreciated all assistance received from [facility] staff.

## 2014-05-08 NOTE — Progress Notes (Signed)
Provided support around pt discharge.  [name] interested in counseling - specifically family counseling with daughters.  Gave contact information for [facility].  [facility] is not taking clients until January.  They recommend [facility] and [facility].  Provided information for these counseling centers.   [name] also interested in divorce groups.  Provided contact information for several divorce groups in [place] and [place].   Provided emotional and spiritual support around pt discharge, separation, relationship with children.

## 2014-05-08 NOTE — BHH Suicide Risk Assessment (Signed)
[place] INPATIENT:  Family/Significant Other Suicide Prevention Education  Suicide Prevention Education:  Education Completed; Rae Mar, Sister, [phone]; has been identified by the patient as the family member/significant other with whom the patient will be residing, and identified as the person(s) who will aid the patient in the event of a mental health crisis (suicidal ideations/suicide attempt).  With written consent from the patient, the family member/significant other has been provided the following suicide prevention education, prior to the and/or following the discharge of the patient.  The suicide prevention education provided includes the following:  Suicide risk factors  Suicide prevention and interventions  National Suicide Hotline telephone number  [facility] assessment telephone number  [facility] Emergency Assistance [place] and/or Residential Mobile Crisis Unit telephone number  Request made of family/significant other to:  Remove weapons (e.g., guns, rifles, knives), all items previously/currently identified as safety concern.  Sister advised patient does not have access to weapons.     Remove drugs/medications (over-the-counter, prescriptions, illicit drugs), all items previously/currently identified as a safety concern.  The family member/significant other verbalizes understanding of the suicide prevention education information provided.  The family member/significant other agrees to remove the items of safety concern listed above.  Concha Pyo 05/08/2014, 10:22 AM

## 2014-05-08 NOTE — Plan of Care (Signed)
Problem: Alteration in mood Goal: LTG-Pt's behavior demonstrates decreased signs of depression 11/2: Goal met: Patient's behavior demonstrates decreased signs of depression to the point the patient is safe to return home and continue treatment in an outpatient setting. Patient rating depression at 3 today.  Tilden Fossa, MSW, [employer] Clinical Social Worker [facility]   Goal met. Patient is rating depression at one. Goal is for depression to be rated at three or below on discharge.  Burnis Medin Meliton Samad, LCSW 10:03 AM   Outcome: Completed/Met Date Met:  05/08/14

## 2014-05-08 NOTE — Progress Notes (Signed)
Recreation Therapy Notes  Animal-Assisted Activity/Therapy (AAA/T) Program Checklist/Progress Notes Patient Eligibility Criteria Checklist & Daily Group note for Rec Tx Intervention  Date: 11.03.2015 Time: 2:45pm Location: [address] Dayroom   AAA/T Program Assumption of Risk Form signed by Patient/ or Parent Legal Guardian yes  Patient is free of allergies or sever asthma no  Patient reports no fear of animals yes  Patient reports no history of cruelty to animals yes   Patient understands his/her participation is voluntary yes  Patient washes hands before animal contact yes  Patient washes hands after animal contact yes  Behavioral Response: Engaged, Appropriate   Education: Contractor, Appropriate Animal Interaction   Education Outcome: Acknowledges education.   Clinical Observations/Feedback: Patient consent form indicates she has sever asthma that is controled by medication. Patient entered group session and immediately brightened upon seeing therapy dog. LRT verified patient consent form is correct, patient stated she wanted to stay in session. LRT honored patient request. Patient showed no signs of distress during session. Patient actively engaged with therapy dog, petting him appropriately from floor level and engaged with peers sharing stories about pets she has had in the past with group members. Patient asked appropriate questions about therapy dog and his training.   [name] [name], LRT/CTRS  [name] 05/08/2014 4:27 PM

## 2014-05-08 NOTE — Progress Notes (Signed)
[facility] Adult Case Management Discharge Plan :  Will you be returning to the same living situation after discharge: Yes,  Patient is returning to her home. At discharge, do you have transportation home?:Yes,  Patient to arrange transportation. Do you have the ability to pay for your medications:Yes,  Patient is able to afford medications.  Release of information consent forms completed and in the chart;  Patient's signature needed at discharge.  Patient to Follow up at: Follow-up Information    Follow up On 05/15/2014.      Follow up with [facility].   Why:  You will be called by [facility] Outpatient Staff with an appointment with Ms. [name] information:   [address] [place], [place]   [address]  [phone]      Patient denies SI/HI:   Patient no longer endorsing SI/HI or other thoughts of self harm.     Safety Planning and Suicide Prevention discussed: .Reviewed with all patients during discharge planning group  [name], [name] 05/08/2014, 10:27 AM

## 2014-05-08 NOTE — BHH Group Notes (Signed)
The focus of this group is to educate the patient on the purpose and policies of crisis stabilization and provide a format to answer questions about their admission.  The group details unit policies and expectations of patients while admitted.  Patient attended 0900 nurse education orientation group this morning.  Patient actively participated, appropriate affect, alert, appropriate insight and engagement.  Today patient will work on 3 goals for discharge.  

## 2014-05-08 NOTE — Discharge Summary (Signed)
Physician Discharge Summary Note  Patient:  [name] is an 43 y.o., female MRN:  [id number] DOB:  August 06, 1970 Patient phone:  [phone] (home)  Patient address:   [place] [address],  Total Time spent with patient: 30 minutes  Date of Admission:  05/04/2014 Date of Discharge: 05/08/14  Reason for Admission:  Depression, Suicidal thoughts   Discharge Diagnoses: Active Problems:   Major depression   Major depressive disorder, recurrent, severe without psychotic features  Psychiatric Specialty Exam: Physical Exam  Psychiatric: She has a normal mood and affect. Her speech is normal and behavior is normal. Judgment and thought content normal. Cognition and memory are normal.    Review of Systems  Constitutional: Negative.   HENT: Negative.   Eyes: Negative.   Respiratory: Negative.   Cardiovascular: Negative.   Gastrointestinal: Negative.   Genitourinary: Negative.   Musculoskeletal: Negative.   Skin: Negative.   Neurological: Negative.   Endo/Heme/Allergies: Negative.   Psychiatric/Behavioral: Positive for depression (Stable with treatment ). Negative for suicidal ideas, hallucinations, memory loss and substance abuse. The patient is nervous/anxious (Stable with treatment ) and has insomnia (Stable with treatment (Ambien) ).     Blood pressure 126/77, pulse 95, temperature 98.6 F (37 C), temperature source Oral, resp. rate 18, height 5\' 3"  (1.6 m), weight 71.668 kg (158 lb), SpO2 99 %.Body mass index is 28 kg/(m^2).  See Physician SRA                                                  Past Psychiatric History: See H&P Diagnosis:  Hospitalizations:  Outpatient Care:  Substance Abuse Care:  Self-Mutilation:  Suicidal Attempts:  Violent Behaviors:   Musculoskeletal: Strength & Muscle Tone: within normal limits Gait & Station: normal Patient leans: N/A  DSM5:  AXIS I: Major Depression, Severe, without  psychotic features  AXIS II: Deferred AXIS III:  Past Medical History  Diagnosis Date  . Anxiety   . Asthma   . GERD (gastroesophageal reflux disease)   . 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 Allergy     s/p allergy testing; allergic to dogs, cats, molds, horses.  . Depression    AXIS IV: marital discord, separation, chronic medical illness AXIS V: 65 upon discharge  Level of Care:  OP  Hospital Course:  [name] is a 43 year old female who presented voluntarily to [facility] with complaint of suicidal thoughts, depression, and anxiety. Patient reports going to the [facility] for these problems and was referred to the ED for evaluation. The patient reports multiple stressors over the last year. Patient states during her psychiatric assessment "I just let the stress get to me. I made some poor choices. I tried to get my hands on a gun. I think I would have used it if I could have. But I got distracted by people coming home. Since my husband would not give me one I tried to get my mother's. It's just been a rough year. My daughter and my father had major health issues. 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 Then I found out my husband was having an affair. I guess I just kept pushing through. I am used to taking are of other people. Then my husband told me  he was leaving me on Mother's Day. And my children told me they were happier with their father because he had a real family with his girlfriend. I think that is really what sent me over the edge. We were always a close family. I miss that too. I'm just not myself lately. I don't even want to work. 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 My Primary Care Provider let me know that she is not comfortable prescribing my medications anymore. I really am so depressed. But I don't feel like hurting myself anymore." The  patient was very tearful during the interview when talking about her stressors. She also appeared very anxious, which the patient attributed to being in a psychiatric hospital for the first time.          [name] was admitted to the adult unit where she was evaluated and her symptoms were identified. Medication management was discussed and implemented. Her Effexor XR was continued at 150 mg daily for treatment of depression. The medication Abilify 2 mg daily was added to augment her treatment for depression.  She was encouraged to participate in unit programming. Medical problems were identified and treated appropriately. Home medication was restarted as needed.  Her home medications for treatment of Wegener's Disease were continued to include Methotrexate and Prednisone.  She was evaluated each day by a clinical provider to ascertain the patient's response to treatment.  Improvement was noted by the patient's report of decreasing symptoms, improved sleep and appetite, affect, medication tolerance, behavior, and participation in unit programming.  The patient was asked each day to complete a self inventory noting mood, mental status, pain, new symptoms, anxiety and concerns.         She responded well to medication and being in a therapeutic and supportive environment. Patient talked about learning how to cope with her numerous stressors in groups.  Positive and appropriate behavior was noted and the patient was motivated for recovery.  She worked closely with the treatment team and case manager to develop a discharge plan with appropriate goals. Coping skills, problem solving as well as relaxation therapies were also part of the unit programming.          By the day of discharge she was in much improved condition than upon admission.  Symptoms were reported as significantly decreased or resolved completely.  The patient denied SI/HI and voiced no AVH. Patient reported that it was not like her to have  suicidal thoughts. 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 The patient appeared to be very future oriented.  She was motivated to continue taking medication with a goal of continued improvement in mental health.  [name] was discharged home with a plan to follow up as noted below.  Consults:  psychiatry  Significant Diagnostic Studies:  Chemistry profile, CBC, UA positive for UTI from E coli, UDS negative  Discharge Vitals:   Blood pressure 126/77, pulse 95, temperature 98.6 F (37 C), temperature source Oral, resp. rate 18, height 5\' 3"  (1.6 m), weight 71.668 kg (158 lb), SpO2 99 %. Body mass index is 28 kg/(m^2). Lab Results:   No results found for this or any previous visit (from the past 72 hour(s)).  Physical Findings: AIMS: Facial and Oral Movements Muscles of Facial Expression: None, normal Lips and Perioral Area: None, normal Jaw: None, normal Tongue: None, normal,Extremity Movements Upper (arms, wrists, hands, fingers): None, normal Lower (legs, knees,  ankles, toes): None, normal, Trunk Movements Neck, shoulders, hips: None, normal, Overall Severity Severity of abnormal movements (highest score from questions above): None, normal Incapacitation due to abnormal movements: None, normal Patient's awareness of abnormal movements (rate only patient's report): No Awareness, Dental Status Current problems with teeth and/or dentures?: No Does patient usually wear dentures?: No  CIWA:  CIWA-Ar Total: 1 COWS:  COWS Total Score: 2  Psychiatric Specialty Exam: See Psychiatric Specialty Exam and Suicide Risk Assessment completed by Attending Physician prior to discharge.  Discharge destination:  Home  Is patient on multiple antipsychotic therapies at discharge:  No   Has Patient had three or more failed trials of antipsychotic monotherapy by history:  No  Recommended Plan for Multiple Antipsychotic Therapies: NA  Discharge Instructions     Discharge instructions    Complete by:  As directed   Please follow up with your Primary Care Provider if symptoms of urinary tract infection persist after treatment with Cipro.            Medication List    TAKE these medications      Indication   albuterol 108 (90 BASE) MCG/ACT inhaler  Commonly known as:  PROVENTIL HFA;VENTOLIN HFA  Inhale 2 puffs into the lungs every 6 (six) hours as needed for wheezing or shortness of breath.      ARIPiprazole 2 MG tablet  Commonly known as:  ABILIFY  Take 1 tablet (2 mg total) by mouth daily.   Indication:  Major Depressive Disorder     ciprofloxacin 500 MG tablet  Commonly known as:  CIPRO  Take 1 tablet (500 mg total) by mouth 2 (two) times daily. Take one tablet and 5 pm on 05/08/14 and then on the morning of 05/09/14 to finish treatment for urinary tract infection.   Indication:  Urethritis     dexlansoprazole 60 MG capsule  Commonly known as:  DEXILANT  Take 1 capsule (60 mg total) by mouth daily.   Indication:  Gastroesophageal Reflux Disease     Fluticasone-Salmeterol 250-50 MCG/DOSE Aepb  Commonly known as:  ADVAIR  Inhale 1 puff into the lungs daily.   Indication:  Asthma     folic acid 1 MG tablet  Commonly known as:  FOLVITE  Take 1 tablet (1 mg total) by mouth daily.   Indication:  Deficiency of Folic Acid in the Diet     gentamicin ointment 0.1 %  Commonly known as:  GARAMYCIN  Apply 1 application topically 2 (two) times daily.   Indication:  Minor Skin Infection due to a Bacteria     ibuprofen 200 MG tablet  Commonly known as:  ADVIL,MOTRIN  Take 600 mg by mouth every 6 (six) hours as needed for moderate pain.      LORazepam 0.5 MG tablet  Commonly known as:  ATIVAN  Take 0.5 mg by mouth every 8 (eight) hours as needed for anxiety or sleep.      methotrexate 2.5 MG tablet  Commonly known as:  RHEUMATREX  Take 8 tablets (20 mg total) by mouth once a week. Caution:Chemotherapy. Protect from light.   Indication:   Wegener's Granulomatosis     montelukast 10 MG tablet  Commonly known as:  SINGULAIR  Take 1 tablet (10 mg total) by mouth daily.   Indication:  Asthma     predniSONE 5 MG tablet  Commonly known as:  DELTASONE  Take 1 tablet (5 mg total) by mouth daily with breakfast.   Indication:  Wegener's Disease  venlafaxine XR 150 MG 24 hr capsule  Commonly known as:  EFFEXOR-XR  Take 1 capsule (150 mg total) by mouth daily with breakfast.   Indication:  Major Depressive Disorder     zolpidem 5 MG tablet  Commonly known as:  AMBIEN  Take 1 tablet (5 mg total) by mouth at bedtime as needed for sleep.   Indication:  Trouble Sleeping           Follow-up Information    Follow up On 05/15/2014.      Follow up with [facility].   Why:  You will be called by [facility] Outpatient Staff with an appointment with Ms. [name] information:   [address] [place], [place]   [address]  [phone]      Follow-up recommendations:   Activity: As tolerated Diet: Regular Tests: NA Other: See below  Comments:   Take all your medications as prescribed by your mental healthcare provider.  Report any adverse effects and or reactions from your medicines to your outpatient provider promptly.  Patient is instructed and cautioned to not engage in alcohol and or illegal drug use while on prescription medicines.  In the event of worsening symptoms, patient is instructed to call the crisis hotline, 911 and or go to the nearest ED for appropriate evaluation and treatment of symptoms.  Follow-up with your primary care provider for your other medical issues, concerns and or health care needs.   Total Discharge Time:  Greater than 30 minutes.  SignedElmarie Shiley NP-C 05/08/2014, 10:54 AM   Patient seen, Suicide Assessment Completed.  Disposition Plan Reviewed

## 2014-05-08 NOTE — [facility] Suicide Risk Assessment (Signed)
Demographic Factors:  43 year old caucasian female, currently separated, employed   Total Time spent with patient: 30 minutes  Psychiatric Specialty Exam: Physical Exam  ROS  Blood pressure 121/73, pulse 105, temperature 98.6 F (37 C), temperature source Oral, resp. rate 18, height 5\' 3"  (1.6 m), weight 71.668 kg (158 lb), SpO2 99 %.Body mass index is 28 kg/(m^2).  General Appearance: Well Groomed  Engineer, water::  Good  Speech:  Normal Rate  Volume:  Normal  Mood:  improved, currently denies depressionl affect fuller in range  Affect:  Full Range and appears euthymic at this time  Thought Process:  Goal Directed and Linear  Orientation:  Full (Time, Place, and Person)  Thought Content:  no hallucinations, no delusions, less ruminative about stressors  Suicidal Thoughts:  No- at this time denies any thoughts of hurting self or anyone else   Homicidal Thoughts:  No  Memory:  recent and remote grossly intact   Judgement:  Other:  improved  Insight:  Present  Psychomotor Activity:  Normal  Concentration:  Good  Recall:  Good  Fund of Knowledge:Good  Language: Good  Akathisia:  Negative  Handed:  Right  AIMS (if indicated):     Assets:  Desire for Improvement Financial Resources/Insurance Housing Resilience Vocational/Educational  Sleep:  Number of Hours: 4.75    Musculoskeletal: Strength & Muscle Tone: within normal limits Gait & Station: normal Patient leans: N/A   Mental Status Per Nursing Assessment::   On Admission:     Current Mental Status by Physician: As noted, at this time patient is much improved compared to admission, her mood is better and today seems euthymic, with a full range of affect, no thought disorder, no SI, no HI, no psychotic symptoms, future oriented .  Loss Factors: marital discord, separation, chronic medical illness  Historical Factors: History of depression, no history of suicide attempts, no history of violence. 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  There are no identifiable plans, no associated intent, mild dysphoria and related symptoms, good self-control (both objective and subjective assessment), few other risk factors, and identifiable protective factors, including available and accessible social support.  Discharge Diagnoses:   AXIS I: Major Depression, Severe, without psychotic features  AXIS II:  Deferred AXIS III:   Past Medical History  Diagnosis Date  . Anxiety   . Asthma   . GERD (gastroesophageal reflux disease)   . 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 Allergy     s/p allergy testing; allergic to dogs, cats, molds, horses.  . Depression    AXIS IV:  marital discord, separation, chronic medical illness AXIS V: 65 upon discharge  Plan Of Care/Follow-up recommendations:  Activity:  As tolerated Diet:  Regular Tests:  NA Other:  See below  Is patient on multiple antipsychotic therapies at discharge:  No   Has Patient had three or more failed trials of antipsychotic monotherapy by history:  No  Recommended Plan for Multiple Antipsychotic Therapies: NA  Patient is leaving unit in good spirits. Plans to follow up  At [facility] for outpatient  psychiatric services. Plans to follow up with her PCP, Dr. Tamala Julian, and with her Rheumatologist, Dr. Zenia Resides at [facility] , for management of her chronic medical illness.      Follow up with [facility].   Why: You will be called by [facility] Outpatient Staff with an appointment with Ms. [name] information:   [address] [place], [place] [address]     Neita Garnet 05/08/2014, 10:08 AM

## 2014-05-08 NOTE — Progress Notes (Signed)
D:  Patient's self inventory sheet, patient has fair sleep, sleep medications are helpful.  Good appetite, high energy level, good concentration.  Rated depression and anxiety 2, hopeless 1.  Denied withdrawals.  Denied SI.  Denied physical problems.  Denied physical pain.  Goal today is to discharge home.  Plan to finalize discharge plan.  No problems anticipated after discharge. A:  Medications administered per MD orders.  Emotional support and encouragement given patient. R:  Denied SI and Hi.  Denied A/V hallucinations.  Safety maintained with 15 minute checks.

## 2014-05-09 ENCOUNTER — Telehealth (HOSPITAL_BASED_OUTPATIENT_CLINIC_OR_DEPARTMENT_OTHER): Payer: Self-pay | Admitting: Emergency Medicine

## 2014-05-09 NOTE — Telephone Encounter (Signed)
Post ED Visit - Positive Culture Follow-up  Culture report reviewed by antimicrobial stewardship pharmacist: []  [place], Pharm.D., BCPS []  Heide Guile, Pharm.D., BCPS []  Alycia Rossetti, Pharm.D., BCPS []  [place], Pharm.D., BCPS, AAHIVP []  Legrand [place], Pharm.D., BCPS, AAHIVP [x]  Elicia Lamp, Pharm.D.   Positive urine culture >100,000 colonies/ml E. Coli Treated with Cipro, asymptomatic , organism sensitive to the same and no further patient follow-up is required at this time.  Hazle Nordmann 05/09/2014, 10:35 AM

## 2014-05-11 NOTE — Progress Notes (Signed)
Patient Discharge Instructions:  Next Level Care Provider Has Access to the EMR, 05/11/14  Records provided to [facility] via [facility]/Epic access.  [name], 05/11/2014, 2:41 PM

## 2014-06-11 ENCOUNTER — Telehealth: Payer: Self-pay

## 2014-06-11 MED ORDER — ARIPIPRAZOLE 2 MG PO TABS: 2.0000 mg | ORAL_TABLET | Freq: Every day | ORAL | Status: DC

## 2014-06-11 NOTE — Telephone Encounter (Signed)
Meds ordered this encounter  Medications  . ARIPiprazole (ABILIFY) 2 MG tablet    Sig: Take 1 tablet (2 mg total) by mouth daily.    Dispense:  10 tablet    Refill:  0    Order Specific Question:  Supervising Provider    Answer:  DOOLITTLE, ROBERT P [[id number]]

## 2014-06-11 NOTE — Telephone Encounter (Signed)
Called in Rx and pt notified

## 2014-06-11 NOTE — Telephone Encounter (Signed)
Patient would like to speak with someone regarding getting a refill on her medication.   Best#: [phone]

## 2014-06-11 NOTE — Telephone Encounter (Signed)
Pt has an appt with her psychiatrist on 12/15- she is going to run out of the Abilify tomorrow. She has not been able to see the new doctor since being at behavioral health. They will not give her a refill on this. Pt is asking for a week supply to get her to her appt. Please advise.

## 2014-06-26 ENCOUNTER — Telehealth: Payer: Self-pay

## 2014-06-26 NOTE — Telephone Encounter (Signed)
Dr. Tamala Julian,  Patient has a virus and is on all kinds of meds.  Her mouth is broken out.   Requesting majic mouthwash.    [place]   [place], [place]

## 2014-06-27 NOTE — Telephone Encounter (Signed)
LM for rtn call- Pt needs to RTC for evaluation

## 2014-07-08 ENCOUNTER — Ambulatory Visit (INDEPENDENT_AMBULATORY_CARE_PROVIDER_SITE_OTHER): Payer: BC Managed Care – PPO | Admitting: Family Medicine

## 2014-07-08 VITALS — BP 142/80 | HR 96 | Temp 97.6°F | Resp 16 | Ht 63.0 in | Wt 161.6 lb

## 2014-07-08 DIAGNOSIS — J4531 Mild persistent asthma with (acute) exacerbation: Secondary | ICD-10-CM

## 2014-07-08 DIAGNOSIS — Z23 Encounter for immunization: Secondary | ICD-10-CM

## 2014-07-08 DIAGNOSIS — Z5181 Encounter for therapeutic drug level monitoring: Secondary | ICD-10-CM

## 2014-07-08 DIAGNOSIS — F333 Major depressive disorder, recurrent, severe with psychotic symptoms: Secondary | ICD-10-CM

## 2014-07-08 DIAGNOSIS — M313 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: Secondary | ICD-10-CM

## 2014-07-08 LAB — POCT CBC
Granulocyte percent: 73.9 %G (ref 37–80)
HCT, POC: 38.1 % (ref 37.7–47.9)
Hemoglobin: 11.6 g/dL — AB (ref 12.2–16.2)
Lymph, poc: 1.9 (ref 0.6–3.4)
MCH, POC: 29.5 pg (ref 27–31.2)
MCHC: 30.6 g/dL — AB (ref 31.8–35.4)
MCV: 96.2 fL (ref 80–97)
MID (cbc): 0.7 (ref 0–0.9)
MPV: 6.4 fL (ref 0–99.8)
POC Granulocyte: 7.5 — AB (ref 2–6.9)
POC LYMPH PERCENT: 18.7 %L (ref 10–50)
POC MID %: 7.4 %M (ref 0–12)
Platelet Count, POC: 494 10*3/uL — AB (ref 142–424)
RBC: 3.95 M/uL — AB (ref 4.04–5.48)
RDW, POC: 20.7 %
WBC: 10.1 10*3/uL (ref 4.6–10.2)

## 2014-07-08 MED ORDER — PREDNISONE 20 MG PO TABS: ORAL_TABLET | ORAL | Status: DC

## 2014-07-08 NOTE — Patient Instructions (Signed)
1. CALL [name] FOR APPOINTMENT IN UPCOMING 1-2 WEEKS.  Asthma, Acute Bronchospasm Acute bronchospasm caused by asthma is also referred to as an asthma attack. Bronchospasm means your air passages become narrowed. The narrowing is caused by inflammation and tightening of the muscles in the air tubes (bronchi) in your lungs. This can make it hard to breathe or cause you to wheeze and cough. CAUSES Possible triggers are:  Animal dander from the skin, hair, or feathers of animals.  Dust mites contained in house dust.  Cockroaches.  Pollen from trees or grass.  Mold.  Cigarette or tobacco smoke.  Air pollutants such as dust, household cleaners, hair sprays, aerosol sprays, paint fumes, strong chemicals, or strong odors.  Cold air or weather changes. Cold air may trigger inflammation. Winds increase molds and pollens in the air.  Strong emotions such as crying or laughing hard.  Stress.  Certain medicines such as aspirin or beta-blockers.  Sulfites in foods and drinks, such as dried fruits and wine.  Infections or inflammatory conditions, such as a flu, cold, or inflammation of the nasal membranes (rhinitis).  Gastroesophageal reflux disease (GERD). GERD is a condition where stomach acid backs up into your esophagus.  Exercise or strenuous activity. SIGNS AND SYMPTOMS   Wheezing.  Excessive coughing, particularly at night.  Chest tightness.  Shortness of breath. DIAGNOSIS  Your health care provider will ask you about your medical history and perform a physical exam. A chest X-ray or blood testing may be performed to look for other causes of your symptoms or other conditions that may have triggered your asthma attack. TREATMENT  Treatment is aimed at reducing inflammation and opening up the airways in your lungs. Most asthma attacks are treated with inhaled medicines. These include quick relief or rescue medicines (such as bronchodilators) and controller medicines (such  as inhaled corticosteroids). These medicines are sometimes given through an inhaler or a nebulizer. Systemic steroid medicine taken by mouth or given through an IV tube also can be used to reduce the inflammation when an attack is moderate or severe. Antibiotic medicines are only used if a bacterial infection is present.  HOME CARE INSTRUCTIONS   Rest.  Drink plenty of liquids. This helps the mucus to remain thin and be easily coughed up. Only use caffeine in moderation and do not use alcohol until you have recovered from your illness.  Do not smoke. Avoid being exposed to secondhand smoke.  You play a critical role in keeping yourself in good health. Avoid exposure to things that cause you to wheeze or to have breathing problems.  Keep your medicines up-to-date and available. Carefully follow your health care provider's treatment plan.  Take your medicine exactly as prescribed.  When pollen or pollution is bad, keep windows closed and use an air conditioner or go to places with air conditioning.  Asthma requires careful medical care. See your health care provider for a follow-up as advised. If you are more than [redacted] weeks pregnant and you were prescribed any new medicines, let your obstetrician know about the visit and how you are doing. Follow up with your health care provider as directed.  After you have recovered from your asthma attack, make an appointment with your outpatient doctor to talk about ways to reduce the likelihood of future attacks. If you do not have a doctor who manages your asthma, make an appointment with a primary care doctor to discuss your asthma. SEEK IMMEDIATE MEDICAL CARE IF:   You are getting  worse.  You have trouble breathing. If severe, call your local emergency services (911 in the [place]  You develop chest pain or discomfort.  You are vomiting.  You are not able to keep fluids down.  You are coughing up yellow, green, brown, or bloody sputum.  You have  a fever and your symptoms suddenly get worse.  You have trouble swallowing. MAKE SURE YOU:   Understand these instructions.  Will watch your condition.  Will get help right away if you are not doing well or get worse. Document Released: 10/07/2006 Document Revised: 06/27/2013 Document Reviewed: 12/28/2012 [facility] Patient Information 2015 [place], [place]. This information is not intended to replace advice given to you by your health care provider. Make sure you discuss any questions you have with your health care provider.

## 2014-07-08 NOTE — Progress Notes (Signed)
Subjective:  This chart was scribed for [name] Forts, MD by [name], ED Scribe at [facility].The patient was seen in exam room 14 and the patient's care was started at 4:05 PM.   Patient ID: [name], female    DOB: [birth year], 44 y.o.   MRN: [id number]  07/08/2014  Shortness of Breath and Breathing Problem   Shortness of Breath Associated symptoms include rhinorrhea. Pertinent negatives include no abdominal pain, chest pain, ear pain, fever, headaches, leg swelling, sore throat or vomiting.  Breathing Problem She complains of cough, difficulty breathing and shortness of breath. Associated symptoms include rhinorrhea. Pertinent negatives include no chest pain, ear pain, fever, headaches, postnasal drip, sore throat or trouble swallowing.    HPI Comments: [name] is a 44 y.o. female who presents to [facility] complaining of SOB and breathing issues due to her asthma, onset 1 week. Pt states she feels like she is not getting a good breath. Does not usually bother her during this time of year. Cold air improves her breathing symptoms. She has been using a nebulizer and rescue inhaler BID for relief. Has yellow/bloody rhinorrhea, sinus  pressure, dry cough as associated symptoms. Her snoring has worsened. Deep breathing worsens her cough. She is using Advair, singular and a nasal rinse for relief. Pt is on prednisone 5 mg for 2 years per rheumatology for Wegener's.  Her asthma typically flares up in the spring and fall. Peak flow done in exam room ranging from 350-390; pt does not know baseline peak flow but predicted is 425.  She denies fever, chills, ear pain, sore throat and leg swelling.  She has had increased bloody discharge from nose but has not contacted [name] yet for an appointment; she started using abx sinus rinse when bloody discharge returned.  She is concerned that asthma may be due to Wegener's since this is unusual time of year for breathing  difficulties.  Borrowed mother's nebulizer machine this week with relief.   Pt was admitted to the ED for suicidal ideation after last visit, and her mood has improved since. She was weaned of the Effexor and started on Zoloft and up the dosage of Abilify. She has been off the Effexor for one week; she has been taking Zoloft for one month and Abilify and is feeling much better since being off the medication. Sees Dr. [name] every other week and her therapist every other week. She is seeing [place] in [place]. Starting divorce classes in march at [employer].  Denies SI/HI.  Work is going very well.  Needs labs for Abilify including Hgba1c, glucose, thyroid.  Review of Systems  Constitutional: Negative for fever, chills, diaphoresis and fatigue.  HENT: Positive for nosebleeds, rhinorrhea and sinus pressure. Negative for ear pain, postnasal drip, sore throat and trouble swallowing.   Respiratory: Positive for cough and shortness of breath.   Cardiovascular: Negative for chest pain, palpitations and leg swelling.  Gastrointestinal: Negative for nausea, vomiting, abdominal pain, diarrhea and constipation.  Neurological: Negative for dizziness and headaches.  Psychiatric/Behavioral: Positive for dysphoric mood. Negative for suicidal ideas, sleep disturbance and self-injury. The patient is not nervous/anxious.     Past Medical History  Diagnosis Date  . Anxiety   . Asthma   . GERD (gastroesophageal reflux disease)   . 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 Allergy     s/p allergy testing; allergic to dogs, cats, molds, horses.  . Depression  Past Surgical History  Procedure Laterality Date  . Rhinoplasty      nasal fracture  . Carpal tunnel release      R.  [name].  . Breast surgery  07/07/2007    Augmentation  . Abdominal hysterectomy  07/06/2008    CERVIX and OVARIES intact; DUB.   Allergies  Allergen Reactions  . Ativan [Lorazepam] Other (See Comments)     Hyperactivity per patient  . Erythromycin Base Nausea And Vomiting  . Nitrofurantoin     REACTION: Nausea and vomiting   Current Outpatient Prescriptions  Medication Sig Dispense Refill  . albuterol (PROVENTIL HFA;VENTOLIN HFA) 108 (90 BASE) MCG/ACT inhaler Inhale 2 puffs into the lungs every 6 (six) hours as needed for wheezing or shortness of breath.    . ARIPiprazole (ABILIFY) 2 MG tablet Take 1 tablet (2 mg total) by mouth daily. 10 tablet 0  . dexlansoprazole (DEXILANT) 60 MG capsule Take 1 capsule (60 mg total) by mouth daily. 30 capsule   . folic acid (FOLVITE) 1 MG tablet Take 1 tablet (1 mg total) by mouth daily.    . methotrexate (RHEUMATREX) 2.5 MG tablet Take 8 tablets (20 mg total) by mouth once a week. Caution:Chemotherapy. Protect from light. 4 tablet 0  . montelukast (SINGULAIR) 10 MG tablet Take 1 tablet (10 mg total) by mouth daily.    . predniSONE (DELTASONE) 5 MG tablet Take 1 tablet (5 mg total) by mouth daily with breakfast.    . sertraline (ZOLOFT) 50 MG tablet Take 50 mg by mouth daily.    . predniSONE (DELTASONE) 20 MG tablet Two tablets daily x 5 days then one tablet daily x 5 days 15 tablet 0   No current facility-administered medications for this visit.       Objective:    BP 142/80 mmHg  Pulse 96  Temp(Src) 97.6 F (36.4 C) (Oral)  Resp 16  Ht '5\' 3"'  (1.6 m)  Wt 161 lb 9.6 oz (73.301 kg)  BMI 28.63 kg/m2  SpO2 100% Physical Exam  Constitutional: She is oriented to person, place, and time. She appears well-developed and well-nourished. No distress.  HENT:  Head: Normocephalic and atraumatic.  Right Ear: External ear normal.  Left Ear: External ear normal.  Mouth/Throat: Oropharynx is clear and moist. No oropharyngeal exudate.  Large perforated septum with bloody/yellow mucous drainage. Predominantly blood.  Eyes: Conjunctivae and EOM are normal. Pupils are equal, round, and reactive to light.  Neck: Normal range of motion. Neck supple. No  thyromegaly present.  Cardiovascular: Normal rate, regular rhythm and normal heart sounds.  Exam reveals no gallop and no friction rub.   No murmur heard. Pulmonary/Chest: Effort normal and breath sounds normal. She has no wheezes. She has no rales.  Lymphadenopathy:    She has cervical adenopathy.  Neurological: She is alert and oriented to person, place, and time. No cranial nerve deficit. She exhibits normal muscle tone. Coordination normal.  Skin: Skin is warm and dry. She is not diaphoretic.  Psychiatric: She has a normal mood and affect. Her behavior is normal. Judgment and thought content normal.  Nursing note and vitals reviewed.   PEAK FLOWS: 370, 375, 390. PREDICTED 425.    Assessment & Plan:   1. Asthma with acute exacerbation, mild persistent   2. Wegener's granulomatosis   3. Encounter for therapeutic drug monitoring   4. Severe recurrent major depression w/psychotic features, mood-congruent     1. Asthma Exacerbation: New. Mild.  Increase Albuterol to qid dosing;  rx for Prednisone provided.  Peak flows minimally suppressed at this time; thus, concern for Wegener's pulmonary process.  Refer to [facility] for nebulizer. 2.  Wegener's granulomatosis: followed by rheumatology; obtain ESR. Concern that having acute Wegener's exacerbation due to bloody nasal discharge and pulmonary symptoms. 3.  Severe recurrent major depression: improved from last visit; managed by psychiatry and psychology every other week.  S/p admission for five days.  No active SI. 4.  Encounter for therapeutic drug monitoring: obtain labs due to Abilify use and fax to Dr. [name].     Meds ordered this encounter  Medications  . sertraline (ZOLOFT) 50 MG tablet    Sig: Take 50 mg by mouth daily.  . predniSONE (DELTASONE) 20 MG tablet    Sig: Two tablets daily x 5 days then one tablet daily x 5 days    Dispense:  15 tablet    Refill:  0    No Follow-up on file.   I personally performed the services  described in this documentation, which was scribed in my presence. The recorded information has been reviewed and considered.   Verdella Laidlaw Elayne Guerin, M.D. [facility] [address] [place], [place]  [address] [phone] phone [phone] fax

## 2014-07-09 LAB — COMPREHENSIVE METABOLIC PANEL
ALT: 10 U/L (ref 0–35)
AST: 19 U/L (ref 0–37)
Albumin: 4 g/dL (ref 3.5–5.2)
Alkaline Phosphatase: 72 U/L (ref 39–117)
BUN: 13 mg/dL (ref 6–23)
CO2: 25 mEq/L (ref 19–32)
Calcium: 9.4 mg/dL (ref 8.4–10.5)
Chloride: 104 mEq/L (ref 96–112)
Creat: 0.7 mg/dL (ref 0.50–1.10)
Glucose, Bld: 94 mg/dL (ref 70–99)
Potassium: 4.6 mEq/L (ref 3.5–5.3)
Sodium: 138 mEq/L (ref 135–145)
Total Bilirubin: 0.4 mg/dL (ref 0.2–1.2)
Total Protein: 6.9 g/dL (ref 6.0–8.3)

## 2014-07-09 LAB — TSH: TSH: 0.994 u[IU]/mL (ref 0.350–4.500)

## 2014-07-09 LAB — SEDIMENTATION RATE: Sed Rate: 58 mm/hr — ABNORMAL HIGH (ref 0–22)

## 2014-07-09 LAB — T4, FREE: Free T4: 0.95 ng/dL (ref 0.80–1.80)

## 2014-07-10 LAB — HEMOGLOBIN A1C
Hgb A1c MFr Bld: 5.6 % (ref <5.7)
Mean Plasma Glucose: 114 mg/dL (ref <117)

## 2014-09-28 ENCOUNTER — Telehealth: Payer: Self-pay

## 2014-09-28 MED ORDER — DEXLANSOPRAZOLE 60 MG PO CPDR: 60.0000 mg | DELAYED_RELEASE_CAPSULE | Freq: Every day | ORAL | Status: DC

## 2014-09-28 NOTE — Telephone Encounter (Signed)
Rx sent to pharmacy   

## 2014-09-28 NOTE — Telephone Encounter (Signed)
Pt states that she has been given dexlansoprazole (DEXILANT) 60 MG capsule [[id number]] by Niel Hummer, NP. Who is no longer her PCP. She recently had a CPE with Dr. Tamala Julian, and would like to know if she can refill this medication for her. Please advise   Pharmacy [facility] in [address]

## 2014-09-29 NOTE — Telephone Encounter (Signed)
LMOM that rx was sent to pharm . 

## 2014-10-03 ENCOUNTER — Telehealth: Payer: Self-pay

## 2014-10-03 NOTE — Telephone Encounter (Signed)
Information regarding your request  PA was already submitted for this patient and drug which was denied.;CaseId:[id number];Status:Denied;Appeal Information: Attention:Appeals and Tourist information centre manager [address] [address],[place],[id number];   They will not cover Dexilant. Can we change to something else.

## 2014-10-04 NOTE — Telephone Encounter (Signed)
Call --- insurance denied Dexilant.  What other reflux medications has patient tried before?  Which ones worked and which did not work?

## 2014-10-05 MED ORDER — PANTOPRAZOLE SODIUM 40 MG PO TBEC: 40.0000 mg | DELAYED_RELEASE_TABLET | Freq: Every day | ORAL | Status: DC

## 2014-10-05 NOTE — Telephone Encounter (Signed)
Left message on machine Protonix 40mg  sent to pharmacy. Call back if questions

## 2014-10-05 NOTE — Telephone Encounter (Signed)
Rx sent to pharmacy for generic Protonix 40mg  one tablet daily.

## 2014-10-05 NOTE — Telephone Encounter (Signed)
She has tried Omeprazole, Nexium, basically all of them. I am going to work on an appeal for this medication but in the meantime can we Rx something for now. That will give me time to work on this.

## 2014-10-16 ENCOUNTER — Telehealth: Payer: Self-pay

## 2014-10-16 NOTE — Telephone Encounter (Signed)
Pantoprazole Tablet has been approved 09/08/2014 through 10/08/2015.

## 2014-12-13 ENCOUNTER — Ambulatory Visit (INDEPENDENT_AMBULATORY_CARE_PROVIDER_SITE_OTHER): Payer: BC Managed Care – PPO | Admitting: Family Medicine

## 2014-12-13 VITALS — BP 112/76 | HR 96 | Temp 98.8°F | Resp 17 | Ht 63.5 in | Wt 176.2 lb

## 2014-12-13 DIAGNOSIS — R197 Diarrhea, unspecified: Secondary | ICD-10-CM

## 2014-12-13 DIAGNOSIS — A09 Infectious gastroenteritis and colitis, unspecified: Secondary | ICD-10-CM | POA: Diagnosis not present

## 2014-12-13 LAB — POCT CBC
Granulocyte percent: 72.8 %G (ref 37–80)
HCT, POC: 36.2 % — AB (ref 37.7–47.9)
Hemoglobin: 12 g/dL — AB (ref 12.2–16.2)
Lymph, poc: 2.2 (ref 0.6–3.4)
MCH, POC: 28.7 pg (ref 27–31.2)
MCHC: 33.1 g/dL (ref 31.8–35.4)
MCV: 86.7 fL (ref 80–97)
MID (cbc): 0.8 (ref 0–0.9)
MPV: 6.7 fL (ref 0–99.8)
POC Granulocyte: 8 — AB (ref 2–6.9)
POC LYMPH PERCENT: 19.6 %L (ref 10–50)
POC MID %: 7.6 %M (ref 0–12)
Platelet Count, POC: 350 10*3/uL (ref 142–424)
RBC: 4.17 M/uL (ref 4.04–5.48)
RDW, POC: 20.6 %
WBC: 11 10*3/uL — AB (ref 4.6–10.2)

## 2014-12-13 LAB — COMPREHENSIVE METABOLIC PANEL
ALT: <8 U/L (ref 0–35)
AST: 16 U/L (ref 0–37)
Albumin: 3.8 g/dL (ref 3.5–5.2)
Alkaline Phosphatase: 59 U/L (ref 39–117)
BUN: 14 mg/dL (ref 6–23)
CO2: 24 mEq/L (ref 19–32)
Calcium: 8.9 mg/dL (ref 8.4–10.5)
Chloride: 104 mEq/L (ref 96–112)
Creat: 0.68 mg/dL (ref 0.50–1.10)
Glucose, Bld: 113 mg/dL — ABNORMAL HIGH (ref 70–99)
Potassium: 4.2 mEq/L (ref 3.5–5.3)
Sodium: 139 mEq/L (ref 135–145)
Total Bilirubin: 0.7 mg/dL (ref 0.2–1.2)
Total Protein: 6.6 g/dL (ref 6.0–8.3)

## 2014-12-13 MED ORDER — CIPROFLOXACIN HCL 500 MG PO TABS: 500.0000 mg | ORAL_TABLET | Freq: Two times a day (BID) | ORAL | Status: DC

## 2014-12-13 MED ORDER — METRONIDAZOLE 500 MG PO TABS: 500.0000 mg | ORAL_TABLET | Freq: Two times a day (BID) | ORAL | Status: DC

## 2014-12-13 NOTE — Progress Notes (Addendum)
[facility] [address] [place] [address] [phone]  Date:  12/13/2014   Name:  Lydiann Bonifas   DOB:  19-Jan-1971   MRN:  [id number]  PCP:  [name], MD    Chief Complaint: Spasms; Diarrhea; and Abdominal Cramping   History of Present Illness:  Arlett Goold is a 44 y.o. very pleasant female patient who presents with the following:  Here today with possible episode of colitis.  She has a similar problem about 8 years ago; in the interim she was ok.   She did have a colonoscopy at that time that was negative.  She was thought to have had an infectious colitis and she recalls that she got better with cipro and flagyl.   Last night she had onset of abd cramps, watery diarrhea, nausea, no vomting or fever.  She then notes some blood in her diarrhea.  She continues to have blood and mucus mixed in her stool.   She has not tried to eat today because it makes her worse- however she is able to drink fluids.    No other sx that she has noticed.    She did eat out a few days ago- OW no travel, no suspicous foods, no drinking from streams/ camping, no sick contacts   She is s/p hysterectomy.   No vaginal or urinary sx.   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    Patient Active Problem List   Diagnosis Date Noted  . Major depressive disorder, recurrent, severe without psychotic features   . Severe recurrent major depression w/psychotic features, mood-congruent 05/04/2014  . Major depression 05/04/2014  . Wegener's disease, pulmonary 02/07/2014  . Generalized anxiety disorder 02/07/2014  . Insomnia 02/07/2014  . ALLERGIC RHINITIS 10/12/2006  . ASTHMA 10/12/2006  . GERD 10/12/2006    Past Medical History  Diagnosis Date  . Anxiety   . Asthma   . GERD (gastroesophageal reflux disease)   . 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 Allergy     s/p allergy  testing; allergic to dogs, cats, molds, horses.  . Depression     Past Surgical History  Procedure Laterality Date  . Rhinoplasty      nasal fracture  . Carpal tunnel release      R.  [name].  . Breast surgery  07/07/2007    Augmentation  . Abdominal hysterectomy  07/06/2008    CERVIX and OVARIES intact; DUB.    History  Substance Use Topics  . Smoking status: Never Smoker   . Smokeless tobacco: Never Used  . Alcohol Use: Yes     Comment: Daily for a few weeks     Family History  Problem Relation Age of Onset  . Asthma Mother   . Depression Mother   . Diabetes Mother   . Hyperlipidemia Mother   . Hypertension Mother   . Mental illness Mother     Allergies  Allergen Reactions  . Ativan [Lorazepam] Other (See Comments)    Hyperactivity per patient  . Erythromycin Base Nausea And Vomiting  . Nitrofurantoin     REACTION: Nausea and vomiting    Medication list has been reviewed and updated.  Current Outpatient Prescriptions on File Prior to Visit  Medication Sig Dispense Refill  . albuterol (PROVENTIL HFA;VENTOLIN HFA) 108 (90 BASE) MCG/ACT inhaler Inhale 2 puffs into the lungs every 6 (  six) hours as needed for wheezing or shortness of breath.    . folic acid (FOLVITE) 1 MG tablet Take 1 tablet (1 mg total) by mouth daily.    . methotrexate (RHEUMATREX) 2.5 MG tablet Take 8 tablets (20 mg total) by mouth once a week. Caution:Chemotherapy. Protect from light. 4 tablet 0  . montelukast (SINGULAIR) 10 MG tablet Take 1 tablet (10 mg total) by mouth daily.    . pantoprazole (PROTONIX) 40 MG tablet Take 1 tablet (40 mg total) by mouth daily. 30 tablet 11  . predniSONE (DELTASONE) 20 MG tablet Two tablets daily x 5 days then one tablet daily x 5 days 15 tablet 0  . predniSONE (DELTASONE) 5 MG tablet Take 1 tablet (5 mg total) by mouth daily with breakfast.    . sertraline (ZOLOFT) 50 MG tablet Take 50 mg by mouth daily.    . ARIPiprazole (ABILIFY) 2 MG tablet Take 1 tablet (2  mg total) by mouth daily. (Patient not taking: Reported on 12/13/2014) 10 tablet 0  . dexlansoprazole (DEXILANT) 60 MG capsule Take 1 capsule (60 mg total) by mouth daily. (Patient not taking: Reported on 12/13/2014) 30 capsule 11   No current facility-administered medications on file prior to visit.    Review of Systems:  As per HPI- otherwise negative. 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 Ideal Body Weight: Weight in (lb) to have BMI = 25: 143.1  GEN: WDWN, NAD, Non-toxic, A & O x 3, overweight, looks well HEENT: Atraumatic, Normocephalic. Neck supple. No masses, No LAD.  Bilateral TM wnl, oropharynx normal.  PEERL,EOMI.   Ears and Nose: No external deformity. CV: RRR, No M/G/R. No JVD. No thrill. No extra heart sounds. PULM: CTA B, no wheezes, crackles, rhonchi. No retractions. No resp. distress. No accessory muscle use. ABD: S, ND, +BS. No rebound. No HSM.  Mild generalized tenderness EXTR: No c/c/e NEURO Normal gait.  PSYCH: Normally interactive. Conversant. Not depressed or anxious appearing.  Calm demeanor.   Results for orders placed or performed in visit on 12/13/14  POCT CBC  Result Value Ref Range   WBC 11.0 (A) 4.6 - 10.2 K/uL   Lymph, poc 2.2 0.6 - 3.4   POC LYMPH PERCENT 19.6 10 - 50 %L   MID (cbc) 0.8 0 - 0.9   POC MID % 7.6 0 - 12 %M   POC Granulocyte 8.0 (A) 2 - 6.9   Granulocyte percent 72.8 37 - 80 %G   RBC 4.17 4.04 - 5.48 M/uL   Hemoglobin 12.0 (A) 12.2 - 16.2 g/dL   HCT, POC 36.2 (A) 37.7 - 47.9 %   MCV 86.7 80 - 97 fL   MCH, POC 28.7 27 - 31.2 pg   MCHC 33.1 31.8 - 35.4 g/dL   RDW, POC 20.6 %   Platelet Count, POC 350 142 - 424 K/uL   MPV 6.7 0 - 99.8 fL     Assessment and Plan: Infectious  colitis - Plan: POCT CBC, Comprehensive metabolic panel, ciprofloxacin (CIPRO) 500 MG tablet, metroNIDAZOLE (FLAGYL) 500 MG tablet  Bloody diarrhea - Plan: Stool culture  Likely colitis with diarrhea and blood in stool.  Will treat with cipro and flagyl as she has done in the past with success She will collect a stool sample at home prior to starting abx Follow- up with labs  She will seek care if not improving over the next 24 hours- Sooner if worse.   Advised her that she can stop her abx after 7 days if she seems to be improving quickly   Signed Lamar Blinks, MD  Called 6/10 to check on her:  CMP is normal.  LMOM- let me know if not improving.

## 2014-12-13 NOTE — Patient Instructions (Signed)
Please collect a stool sample and store in the refrigerator prior to starting antibiotics (however please do start absx today) Continue to push fluids, and eat a bland diet as tolerated.  If you are not making definite improvement tomorrow please come back in; go to the ER if you are getting worse I will be in touch with the rest of your labs asap   Please touch base with your rheumatologist; he or she may want to change your methotrexate dose while you are on the cipro

## 2014-12-31 ENCOUNTER — Encounter: Payer: Self-pay | Admitting: Family Medicine

## 2015-01-14 ENCOUNTER — Other Ambulatory Visit: Payer: Self-pay | Admitting: Family Medicine

## 2015-01-26 ENCOUNTER — Other Ambulatory Visit: Payer: Self-pay | Admitting: Family Medicine

## 2015-01-28 NOTE — Telephone Encounter (Signed)
Dr Tamala Julian, I don't see that you have Rxd these for pt before, but you did see her in Jan for asthma and noted that she took these meds. Do you want to RF?

## 2015-11-04 ENCOUNTER — Other Ambulatory Visit: Payer: Self-pay | Admitting: Family Medicine

## 2015-12-03 ENCOUNTER — Other Ambulatory Visit: Payer: Self-pay | Admitting: Family Medicine

## 2015-12-11 ENCOUNTER — Other Ambulatory Visit: Payer: Self-pay

## 2015-12-11 NOTE — Telephone Encounter (Signed)
Pt is requesting a partial refill on her acid reflux medciation she will be coming in to see dr Tamala Julian on 12/26/15   Best number [phone]

## 2015-12-12 MED ORDER — PANTOPRAZOLE SODIUM 40 MG PO TBEC: DELAYED_RELEASE_TABLET | ORAL | Status: DC

## 2015-12-12 NOTE — Telephone Encounter (Signed)
Can we refill until appt?

## 2015-12-26 ENCOUNTER — Ambulatory Visit (INDEPENDENT_AMBULATORY_CARE_PROVIDER_SITE_OTHER): Payer: BC Managed Care – PPO | Admitting: Family Medicine

## 2015-12-26 VITALS — BP 116/78 | HR 80 | Temp 97.9°F | Resp 18 | Ht 63.5 in | Wt 178.0 lb

## 2015-12-26 DIAGNOSIS — N3946 Mixed incontinence: Secondary | ICD-10-CM | POA: Diagnosis not present

## 2015-12-26 DIAGNOSIS — K219 Gastro-esophageal reflux disease without esophagitis: Secondary | ICD-10-CM

## 2015-12-26 DIAGNOSIS — Z6831 Body mass index (BMI) 31.0-31.9, adult: Secondary | ICD-10-CM

## 2015-12-26 DIAGNOSIS — E669 Obesity, unspecified: Secondary | ICD-10-CM

## 2015-12-26 DIAGNOSIS — Z114 Encounter for screening for human immunodeficiency virus [HIV]: Secondary | ICD-10-CM | POA: Diagnosis not present

## 2015-12-26 DIAGNOSIS — J301 Allergic rhinitis due to pollen: Secondary | ICD-10-CM | POA: Diagnosis not present

## 2015-12-26 DIAGNOSIS — F332 Major depressive disorder, recurrent severe without psychotic features: Secondary | ICD-10-CM

## 2015-12-26 DIAGNOSIS — Z0000 Encounter for general adult medical examination without abnormal findings: Secondary | ICD-10-CM | POA: Diagnosis not present

## 2015-12-26 DIAGNOSIS — Z13220 Encounter for screening for lipoid disorders: Secondary | ICD-10-CM | POA: Diagnosis not present

## 2015-12-26 DIAGNOSIS — Z131 Encounter for screening for diabetes mellitus: Secondary | ICD-10-CM | POA: Diagnosis not present

## 2015-12-26 DIAGNOSIS — Z7251 High risk heterosexual behavior: Secondary | ICD-10-CM

## 2015-12-26 DIAGNOSIS — M313 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: Secondary | ICD-10-CM | POA: Diagnosis not present

## 2015-12-26 DIAGNOSIS — Z1329 Encounter for screening for other suspected endocrine disorder: Secondary | ICD-10-CM | POA: Diagnosis not present

## 2015-12-26 LAB — POCT URINALYSIS DIP (MANUAL ENTRY)
Bilirubin, UA: NEGATIVE
Blood, UA: NEGATIVE
Glucose, UA: NEGATIVE
Ketones, POC UA: NEGATIVE
Leukocytes, UA: NEGATIVE
Nitrite, UA: NEGATIVE
Protein Ur, POC: NEGATIVE
Spec Grav, UA: 1.01
Urobilinogen, UA: 0.2
pH, UA: 5.5

## 2015-12-26 LAB — CBC WITH DIFFERENTIAL/PLATELET
Basophils Absolute: 0 cells/uL (ref 0–200)
Basophils Relative: 0 %
Eosinophils Absolute: 124 cells/uL (ref 15–500)
Eosinophils Relative: 2 %
HCT: 38.5 % (ref 35.0–45.0)
Hemoglobin: 12.8 g/dL (ref 11.7–15.5)
Lymphocytes Relative: 25 %
Lymphs Abs: 1550 cells/uL (ref 850–3900)
MCH: 29.6 pg (ref 27.0–33.0)
MCHC: 33.2 g/dL (ref 32.0–36.0)
MCV: 88.9 fL (ref 80.0–100.0)
MPV: 9.2 fL (ref 7.5–12.5)
Monocytes Absolute: 434 cells/uL (ref 200–950)
Monocytes Relative: 7 %
Neutro Abs: 4092 cells/uL (ref 1500–7800)
Neutrophils Relative %: 66 %
Platelets: 269 10*3/uL (ref 140–400)
RBC: 4.33 MIL/uL (ref 3.80–5.10)
RDW: 16.9 % — ABNORMAL HIGH (ref 11.0–15.0)
WBC: 6.2 10*3/uL (ref 3.8–10.8)

## 2015-12-26 LAB — LIPID PANEL
Cholesterol: 240 mg/dL — ABNORMAL HIGH (ref 125–200)
HDL: 79 mg/dL (ref >=46)
LDL Cholesterol: 132 mg/dL — ABNORMAL HIGH (ref <130)
Total CHOL/HDL Ratio: 3 Ratio (ref <=5.0)
Triglycerides: 146 mg/dL (ref <150)
VLDL: 29 mg/dL (ref <30)

## 2015-12-26 LAB — HEMOGLOBIN A1C
Hgb A1c MFr Bld: 5.3 % (ref <5.7)
Mean Plasma Glucose: 105 mg/dL

## 2015-12-26 LAB — POC MICROSCOPIC URINALYSIS (UMFC): Mucus: ABSENT

## 2015-12-26 LAB — COMPREHENSIVE METABOLIC PANEL
ALT: 16 U/L (ref 6–29)
AST: 29 U/L (ref 10–35)
Albumin: 4.1 g/dL (ref 3.6–5.1)
Alkaline Phosphatase: 57 U/L (ref 33–115)
BUN: 13 mg/dL (ref 7–25)
CO2: 24 mmol/L (ref 20–31)
Calcium: 9.3 mg/dL (ref 8.6–10.2)
Chloride: 105 mmol/L (ref 98–110)
Creat: 0.75 mg/dL (ref 0.50–1.10)
Glucose, Bld: 89 mg/dL (ref 65–99)
Potassium: 4.9 mmol/L (ref 3.5–5.3)
Sodium: 139 mmol/L (ref 135–146)
Total Bilirubin: 0.8 mg/dL (ref 0.2–1.2)
Total Protein: 6.7 g/dL (ref 6.1–8.1)

## 2015-12-26 LAB — VITAMIN D 25 HYDROXY (VIT D DEFICIENCY, FRACTURES): Vit D, 25-Hydroxy: 35 ng/mL (ref 30–100)

## 2015-12-26 LAB — VITAMIN B12: Vitamin B-12: 642 pg/mL (ref 200–1100)

## 2015-12-26 LAB — HIV ANTIBODY (ROUTINE TESTING W REFLEX): HIV 1&2 Ab, 4th Generation: NONREACTIVE

## 2015-12-26 LAB — TSH: TSH: 0.74 mIU/L

## 2015-12-26 MED ORDER — PHENTERMINE HCL 15 MG PO CAPS: 15.0000 mg | ORAL_CAPSULE | ORAL | Status: DC

## 2015-12-26 NOTE — Progress Notes (Signed)
Subjective:    Patient ID: [name], female    DOB: 1971/03/07, 45 y.o.   MRN: [id number]  12/26/2015  Annual Exam   HPI This 45 y.o. female presents for Complete Physical Examination.  Last physical:  2015 Pap smear:  06/2015 Dr. Willis [name]; hysterectomy Mammogram:  06/2015 [name] Colonoscopy:  2010; repeat age 14. TDAP:  2006; 2014 Pneumovax:  2016 Influenza:  04/06/2015 Eye exam:  2016 [name] Dental exam:  [name]; every six months   Anxiety and depression: weaning Rexulti; started after failing Abilify; now weaning it; followed by [name] once monthly; seeing therapist [name] in [place]; solo.  Amazing therapist.  Seeing once every two months.  Taking Zoloft 218m daily.  [place] 151year [place]; met on line.    Wegeners: maintained on MTX with Folic acid.  Considering weaning MTX; no issues for 6-8 months; [name] at [name]  No visits with [name] in two years.   Weaned Prednisone three months ago on own; Dr. AZenia Residesnot aware; sees Dr. AZenia Residesnext Friday.  Obesity: exercises; high appetite.  Able to control appetite during the day.  Counting calories.  Trying to stick to 1200kcal.  B: toast with peanut, coffeee milk.  Snack: none Lunch:  LNIKEor leftover, water. Snack: none  Supper: meat, starch, vegetable, water.  Snack: cookies.    Review of Systems  Constitutional: Negative for fever, chills, diaphoresis, activity change, appetite change, fatigue and unexpected weight change.  HENT: Positive for postnasal drip and rhinorrhea. Negative for congestion, dental problem, drooling, ear discharge, ear pain, facial swelling, hearing loss, mouth sores, nosebleeds, sinus pressure, sneezing, sore throat, tinnitus, trouble swallowing and voice change.   Eyes: Negative for photophobia, pain, discharge, redness, itching and visual disturbance.  Respiratory: Negative for apnea, cough, choking, chest tightness, shortness of breath, wheezing and stridor.     Cardiovascular: Negative for chest pain, palpitations and leg swelling.  Gastrointestinal: Negative for nausea, vomiting, abdominal pain, diarrhea, constipation, blood in stool, abdominal distention, anal bleeding and rectal pain.  Endocrine: Negative for cold intolerance, heat intolerance, polydipsia, polyphagia and polyuria.  Genitourinary: Negative for dysuria, urgency, frequency, hematuria, flank pain, decreased urine volume, vaginal bleeding, vaginal discharge, enuresis, difficulty urinating, genital sores, vaginal pain, menstrual problem, pelvic pain and dyspareunia.  Musculoskeletal: Negative for myalgias, back pain, joint swelling, arthralgias, gait problem, neck pain and neck stiffness.  Skin: Negative for color change, pallor, rash and wound.  Allergic/Immunologic: Negative for environmental allergies, food allergies and immunocompromised state.  Neurological: Negative for dizziness, tremors, seizures, syncope, facial asymmetry, speech difficulty, weakness, light-headedness, numbness and headaches.  Hematological: Negative for adenopathy. Does not bruise/bleed easily.  Psychiatric/Behavioral: Negative for suicidal ideas, hallucinations, behavioral problems, confusion, sleep disturbance, self-injury, dysphoric mood, decreased concentration and agitation. The patient is not nervous/anxious and is not hyperactive.     Past Medical History  Diagnosis Date  . Anxiety   . Asthma   . GERD (gastroesophageal reflux disease)   . Wegener's syndrome ([name]     [name] Resides[name] . Allergy     s/p allergy testing; allergic to dogs, cats, molds, horses.  . Depression    Past Surgical History  Procedure Laterality Date  . Rhinoplasty      nasal fracture  . Carpal tunnel release      R.  [name].  . Breast surgery  07/07/2007    Augmentation  . Abdominal hysterectomy  07/06/2008    CERVIX and OVARIES intact; DUB.  Allergies  Allergen Reactions  . Ativan [Lorazepam]  Other (See Comments)    Hyperactivity per patient  . Erythromycin Base Nausea And Vomiting  . Nitrofurantoin     REACTION: Nausea and vomiting    Social History   Social History  . Marital Status: Legally Separated    Spouse Name: N/A  . Number of Children: N/A  . Years of Education: N/A   Occupational History  . Not on file.   Social History Main Topics  . Smoking status: Never Smoker   . Smokeless tobacco: Never Used  . Alcohol Use: Yes     Comment: Daily for a few weeks   . Drug Use: No  . Sexual Activity: Yes    Birth Control/ Protection: Surgical   Other Topics Concern  . Not on file   Social History Narrative   Marital status: separated in 11/2013; married x 22 years.  Dating x October 2016      Children:  2 children (14, 16).      Lives: with 1 children; other daughter living in [place] with father.      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      Tobacco:  None      Alcohol: socially; weekends.      Exercise: 3 times per week; aerobics; elliptical; [employer].      Seatbelt: 100%; no texting      Sexual activity:  Yes; no STDs.   Family History  Problem Relation Age of Onset  . Asthma Mother   . Depression Mother   . Diabetes Mother   . Hyperlipidemia Mother   . Hypertension Mother   . Mental illness Mother   . Arthritis Mother   . Heart disease Father 17    AMI/CABG  . Mental illness Sister        Objective:    BP 116/78 mmHg  Pulse 80  Temp(Src) 97.9 F (36.6 C) (Oral)  Resp 18  Ht 5' 3.5" (1.613 m)  Wt 178 lb (80.74 kg)  BMI 31.03 kg/m2  SpO2 99% Physical Exam  Constitutional: She is oriented to person, place, and time. She appears well-developed and well-nourished. No distress.  HENT:  Head: Normocephalic and atraumatic.  Right Ear: External ear normal.  Left Ear: External ear normal.  Nose: Nose normal.  Mouth/Throat: Oropharynx is  clear and moist.  Eyes: Conjunctivae and EOM are normal. Pupils are equal, round, and reactive to light.  Neck: Normal range of motion and full passive range of motion without pain. Neck supple. No JVD present. Carotid bruit is not present. No thyromegaly present.  Cardiovascular: Normal rate, regular rhythm and normal heart sounds.  Exam reveals no gallop and no friction rub.   No murmur heard. Pulmonary/Chest: Effort normal and breath sounds normal. She has no wheezes. She has no rales.  Abdominal: Soft. Bowel sounds are normal. She exhibits no distension and no mass. There is no tenderness. There is no rebound and no guarding.  Musculoskeletal:       Right shoulder: Normal.       Left shoulder: Normal.       Cervical back: Normal.  Lymphadenopathy:    She has no cervical adenopathy.  Neurological: She is alert and oriented to person, place, and time. She has normal reflexes. No cranial nerve deficit. She exhibits normal muscle tone. Coordination normal.  Skin: Skin is warm and dry. No  rash noted. She is not diaphoretic. No erythema. No pallor.  Psychiatric: She has a normal mood and affect. Her behavior is normal. Judgment and thought content normal.  Nursing note and vitals reviewed.  Results for orders placed or performed in visit on 12/26/15  POCT urinalysis dipstick  Result Value Ref Range   Color, UA yellow yellow   Clarity, UA clear clear   Glucose, UA negative negative   Bilirubin, UA negative negative   Ketones, POC UA negative negative   Spec Grav, UA 1.010    Blood, UA negative negative   pH, UA 5.5    Protein Ur, POC negative negative   Urobilinogen, UA 0.2    Nitrite, UA Negative Negative   Leukocytes, UA Negative Negative  POCT Microscopic Urinalysis ([facility])  Result Value Ref Range   WBC,UR,HPF,POC None None WBC/hpf   RBC,UR,HPF,POC None None RBC/hpf   Bacteria Few (A) None, Too numerous to count   Mucus Absent Absent   Epithelial Cells, UR Per Microscopy Few (A)  None, Too numerous to count cells/hpf       Assessment & Plan:   1. Routine physical examination   2. Mixed incontinence   3. Gastroesophageal reflux disease without esophagitis   4. Major depressive disorder, recurrent, severe without psychotic features ([place])   5. Wegener's disease, pulmonary ([place])   6. Allergic rhinitis due to pollen   7. Screening, lipid   8. Screening for diabetes mellitus   9. Screening for thyroid disorder   10. Screening for HIV (human immunodeficiency virus)   11. High risk sexual behavior    -anticipatory guidance -- weight loss, exercise, calcium intake 3 servings daily. -immunizations UTD; pt to check with insurance regarding Meningococcal coverage -pap smear and mammogram UTD per [name]/gyn -obtain age appropriate screening labs. -anxiety and depression managed by psychiatry monthly; doing really well emotionally. -obesity worsening due to prolonged Prednisone for Wegeners; agreeable to Phentermine now that Wegeners is stable and anxiety/depression stable; recommend 1200kcal daily, 5 days per week exercise.  Rx for Phentermine 42m provided; RTC 4-6 weeks. -allergies well controlled. -[facility] -new onset urinary incontinence likely secondary to obesity worsening; obtain labs; consider physical therapy if no improvement with weight loss.   Orders Placed This Encounter  Procedures  . Urine culture  . CBC with Differential/Platelet  . Comprehensive metabolic panel    Order Specific Question:  Has the patient fasted?    Answer:  Yes  . Hemoglobin A1c  . Lipid panel    Order Specific Question:  Has the patient fasted?    Answer:  Yes  . TSH  . Vitamin B12  . VITAMIN D 25 Hydroxy (Vit-D Deficiency, Fractures)  . HIV antibody  . RPR  . POCT urinalysis dipstick  . POCT Microscopic Urinalysis ([facility])   Meds ordered this encounter  Medications  . Brexpiprazole (REXULTI) 2 MG TABS    Sig: Take by mouth.  .  phentermine 15 MG capsule    Sig: Take 1-2 capsules (15-30 mg total) by mouth every morning.    Dispense:  60 capsule    Refill:  0    Return in about 4 weeks (around 01/23/2016) for recheck.    Nohealani Medinger MElayne Guerin M.D. [facility]  [phone]

## 2015-12-26 NOTE — Patient Instructions (Addendum)
   IF you received an x-ray today, you will receive an invoice from [facility]. Please contact [facility] at [phone] with questions or concerns regarding your invoice.   IF you received labwork today, you will receive an invoice from [facility]. Please contact [facility] at [phone] with questions or concerns regarding your invoice.   Our billing staff will not be able to assist you with questions regarding bills from these companies.  You will be contacted with the lab results as soon as they are available. The fastest way to get your results is to activate your My Chart account. Instructions are located on the last page of this paperwork. If you have not heard from us regarding the results in 2 weeks, please contact this office.     Keeping You Healthy  Get These Tests 1. Blood Pressure- Have your blood pressure checked once a year by your health care provider.  Normal blood pressure is 120/80. 2. Weight- Have your body mass index (BMI) calculated to screen for obesity.  BMI is measure of body fat based on height and weight.  You can also calculate your own BMI at www.nhlbisupport.com/bmi/. 3. Cholesterol- Have your cholesterol checked every 5 years starting at age 20 then yearly starting at age 45. 4. Chlamydia, HIV, and other sexually transmitted diseases- Get screened every year until age 25, then within three months of each new sexual provider. 5. Pap Test - Every 1-5 years; discuss with your health care provider. 6. Mammogram- Every 1-2 years starting at age 40--50  Take these medicines  Calcium with Vitamin D-Your body needs 1200 mg of Calcium each day and 800-1000 IU of Vitamin D daily.  Your body can only absorb 500 mg of Calcium at a time so Calcium must be taken in 2 or 3 divided doses throughout the day.  Multivitamin with folic acid- Once daily if it is possible for you to become pregnant.  Get these  Immunizations  Gardasil-Series of three doses; prevents HPV related illness such as genital warts and cervical cancer.  Menactra-Single dose; prevents meningitis.  Tetanus shot- Every 10 years.  Flu shot-Every year.  Take these steps 1. Do not smoke-Your healthcare provider can help you quit.  For tips on how to quit go to www.smokefree.gov or call 1-800 QUITNOW. 2. Be physically active- Exercise 5 days a week for at least 30 minutes.  If you are not already physically active, start slow and gradually work up to 30 minutes of moderate physical activity.  Examples of moderate activity include walking briskly, dancing, swimming, bicycling, etc. 3. Breast Cancer- A self breast exam every month is important for early detection of breast cancer.  For more information and instruction on self breast exams, ask your healthcare provider or www.womenshealth.gov/faq/breast-self-exam.cfm. 4. Eat a healthy diet- Eat a variety of healthy foods such as fruits, vegetables, whole grains, low fat milk, low fat cheeses, yogurt, lean meats, poultry and fish, beans, nuts, tofu, etc.  For more information go to www. Thenutritionsource.org 5. Drink alcohol in moderation- Limit alcohol intake to one drink or less per day. Never drink and drive. 6. Depression- Your emotional health is as important as your physical health.  If you're feeling down or losing interest in things you normally enjoy please talk to your healthcare provider about being screened for depression. 7. Dental visit- Brush and floss your teeth twice daily; visit your dentist twice a year. 8. Eye doctor- Get an eye exam at least every   2 years. 9. Helmet use- Always wear a helmet when riding a bicycle, motorcycle, rollerblading or skateboarding. 10. Safe sex- If you may be exposed to sexually transmitted infections, use a condom. 11. Seat belts- Seat belts can save your live; always wear one. 12. Smoke/Carbon Monoxide detectors- These detectors need to  be installed on the appropriate level of your home. Replace batteries at least once a year. 13. Skin cancer- When out in the sun please cover up and use sunscreen 15 SPF or higher. 14. Violence- If anyone is threatening or hurting you, please tell your healthcare provider.        

## 2015-12-27 LAB — URINE CULTURE
Colony Count: NO GROWTH
Organism ID, Bacteria: NO GROWTH

## 2015-12-27 LAB — RPR

## 2016-01-03 ENCOUNTER — Telehealth: Payer: Self-pay

## 2016-01-03 NOTE — Telephone Encounter (Signed)
PA pending w/CoverMy Meds

## 2016-01-09 ENCOUNTER — Telehealth: Payer: Self-pay

## 2016-01-09 NOTE — Telephone Encounter (Signed)
PA approved Phentermine through 04/04/2016 per [place] fax

## 2016-01-12 ENCOUNTER — Other Ambulatory Visit: Payer: Self-pay | Admitting: Physician Assistant

## 2016-01-15 IMAGING — CT CT ANGIO CHEST
2 of 6 series · 18 of 36 positions shown · IV contrast (APPLIED)
Comparison: None.

CLINICAL DATA: Shortness of Breath, cough, dyspnea

EXAM:
CT ANGIOGRAPHY CHEST WITH CONTRAST
TECHNIQUE: Multidetector CT imaging of the chest was performed using the
standard protocol during bolus administration of intravenous
contrast. Multiplanar CT image reconstructions and MIPs were
obtained to evaluate the vascular anatomy.
CONTRAST:  100 cc Isovue

[Series 5: pe 1.0 thins · axial · 0.68mm/px · z∈[-548,-318]mm · 17 of 260 slices shown]
[im 15/260  lung]
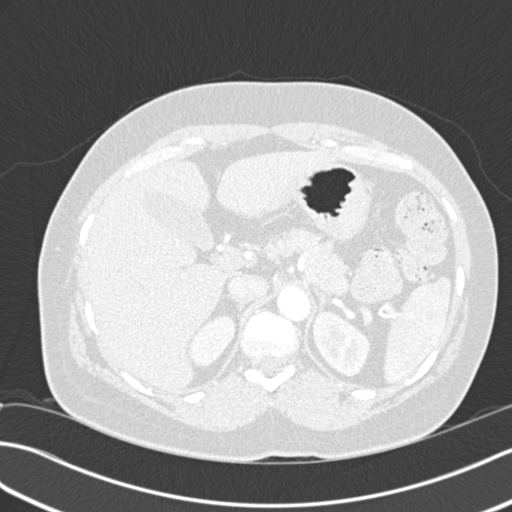
[im 29/260  mediastinal]
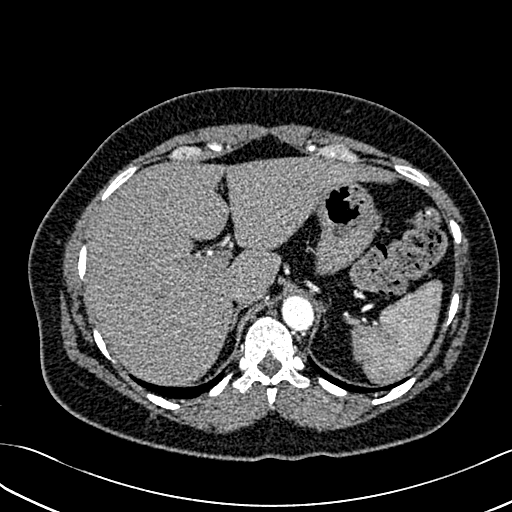
[im 44/260  lung]
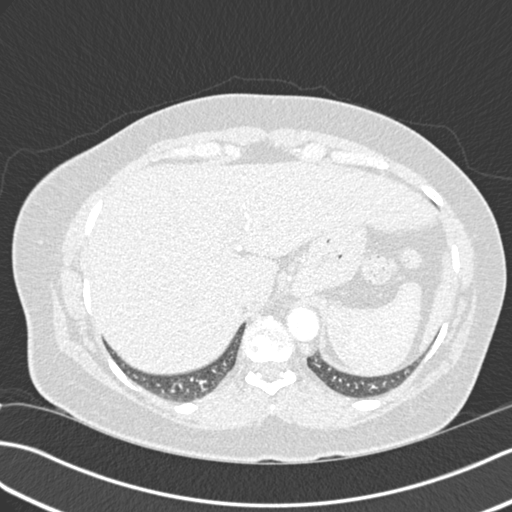
[im 58/260  mediastinal]
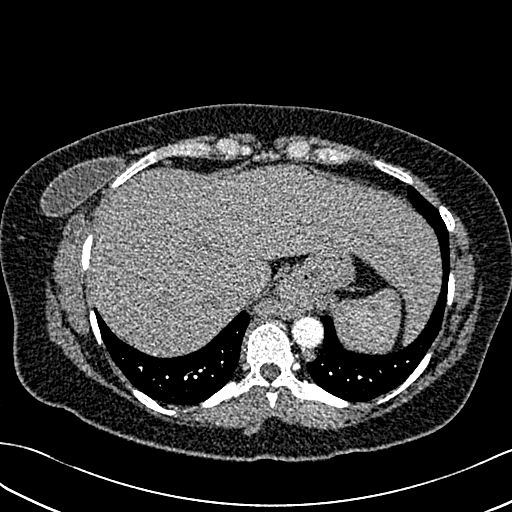
[im 72/260  lung]
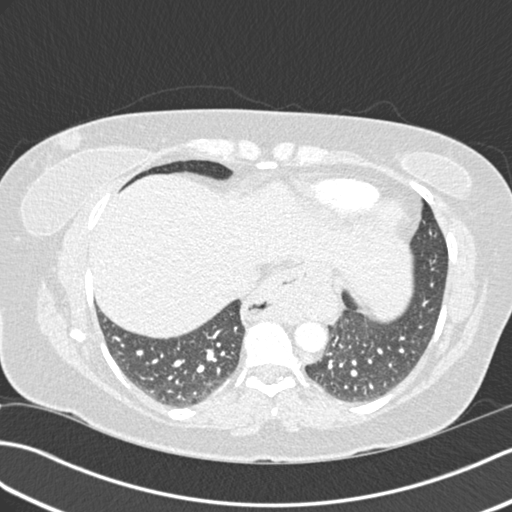
[im 87/260  mediastinal]
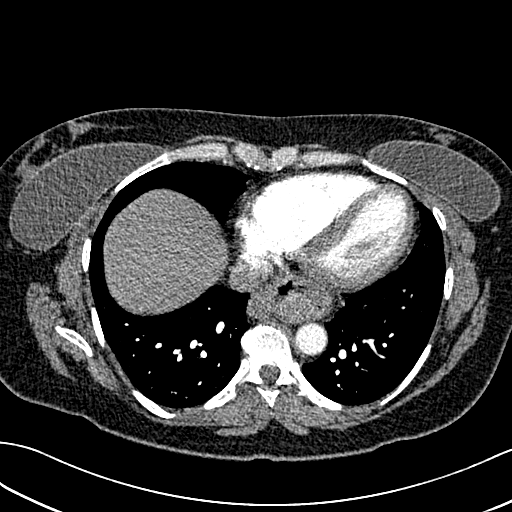
[im 101/260  lung]
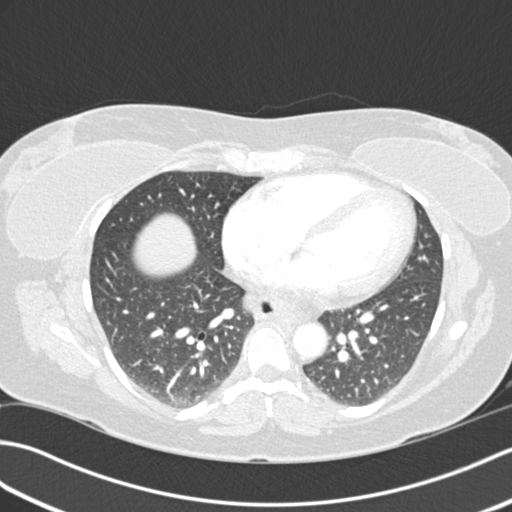
[im 116/260  mediastinal]
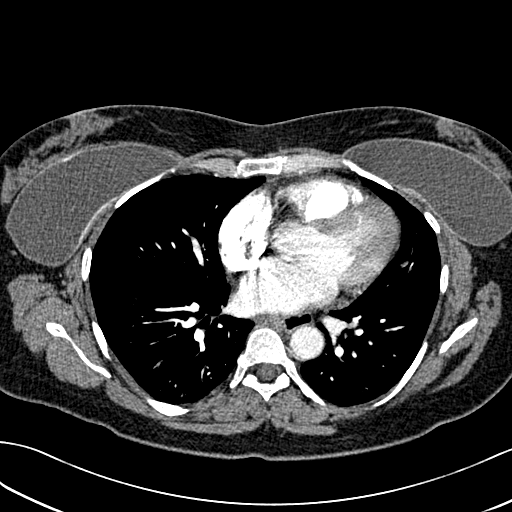
[im 130/260  lung]
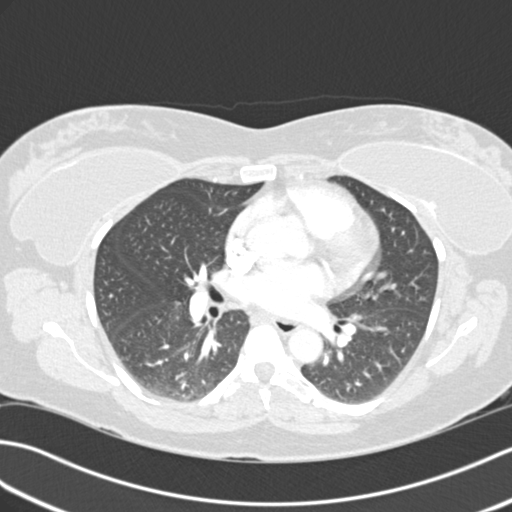
[im 144/260  mediastinal]
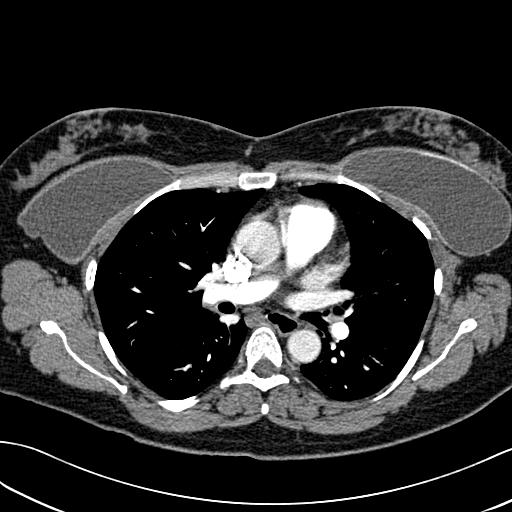
[im 159/260  lung]
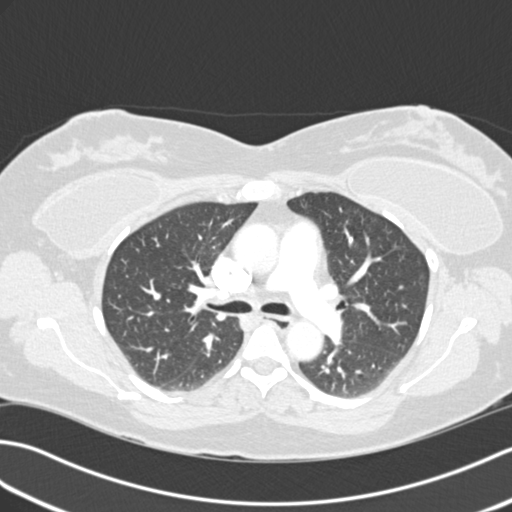
[im 173/260  mediastinal]
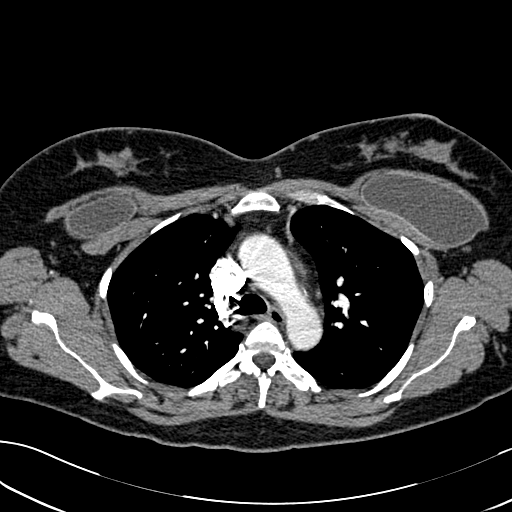
[im 188/260  lung]
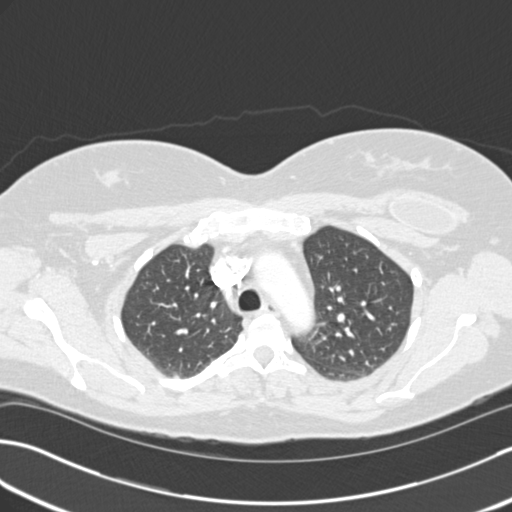
[im 202/260  mediastinal]
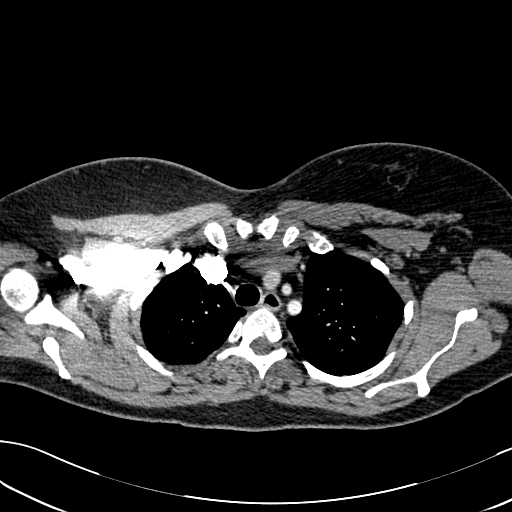
[im 216/260  lung]
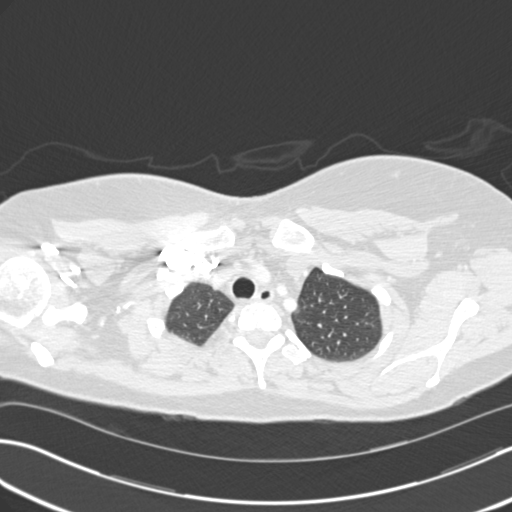
[im 231/260  mediastinal]
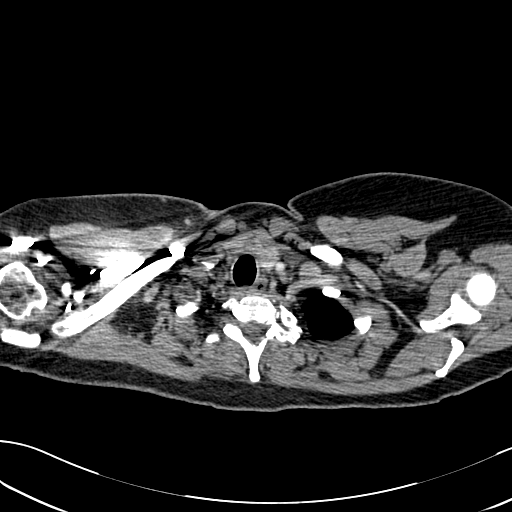
[im 245/260  lung]
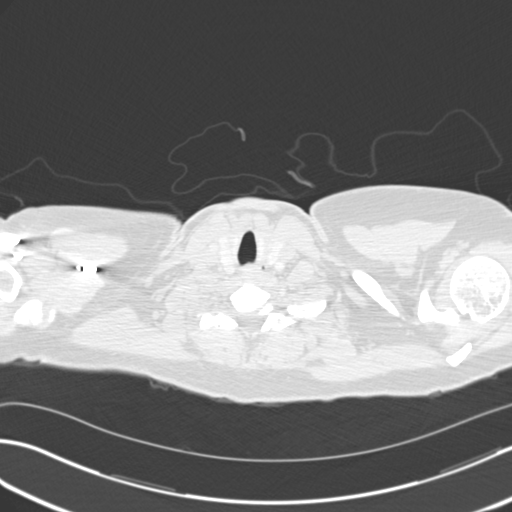

[Series 7: cor pe 2.0 mpr · coronal · 0.54mm/px · 1 of 126 slices shown]
[im 63/126  mediastinal]
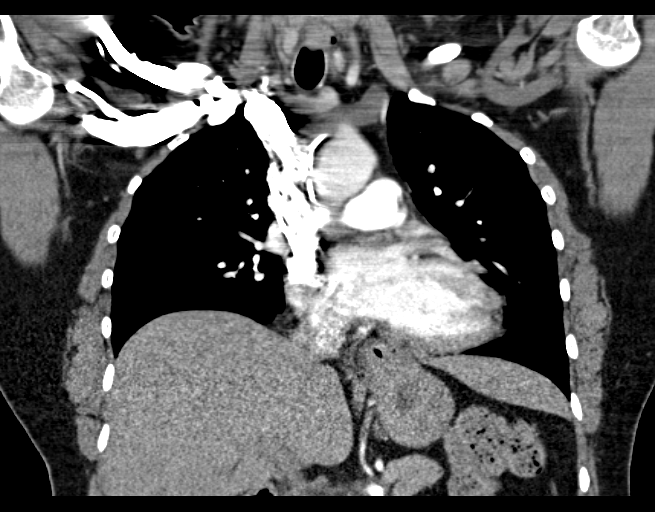

[18 of 36 positions shown; findings below may reference images not displayed]

FINDINGS: Hiatal hernia is noted measures 6.5 cm. Bilateral breast implants.
Heart size within normal limits. No pericardial effusion. Visualized
upper abdomen is unremarkable.

No pulmonary embolus is noted. No mediastinal hematoma or
adenopathy. No destructive bony lesions are noted.

Sagittal images shows mild degenerative changes thoracic spine.

Images of the lung parenchyma shows no acute infiltrate or pulmonary
edema. No pleural effusion. No pneumothorax.

Review of the MIP images confirms the above findings.
IMPRESSION: 1. Bilateral breast implants are noted. Hiatal hernia measures
cm.
2. No pulmonary embolus.
3. No acute infiltrate or pulmonary edema.
4. Mild degenerative changes thoracic spine.

## 2016-02-26 ENCOUNTER — Other Ambulatory Visit: Payer: Self-pay | Admitting: Family Medicine

## 2016-03-30 ENCOUNTER — Other Ambulatory Visit: Payer: Self-pay | Admitting: Family Medicine

## 2016-04-22 ENCOUNTER — Telehealth: Payer: Self-pay

## 2016-04-22 NOTE — Telephone Encounter (Signed)
Dr. Tamala Julian  Patient is requesting advair,  CPE done in June.    [facility] on [address]   [phone]

## 2016-04-22 NOTE — Telephone Encounter (Signed)
Medication reported not taking on 12/26/15.  Do you want her still on this?

## 2016-04-24 MED ORDER — FLUTICASONE-SALMETEROL 100-50 MCG/DOSE IN AEPB: 1.0000 | INHALATION_SPRAY | Freq: Two times a day (BID) | RESPIRATORY_TRACT | 11 refills | Status: DC

## 2016-04-24 NOTE — Telephone Encounter (Signed)
Advair 100/50 bid approved.

## 2016-06-09 ENCOUNTER — Ambulatory Visit (INDEPENDENT_AMBULATORY_CARE_PROVIDER_SITE_OTHER): Payer: BC Managed Care – PPO | Admitting: Student

## 2016-06-09 DIAGNOSIS — R197 Diarrhea, unspecified: Secondary | ICD-10-CM | POA: Diagnosis not present

## 2016-06-09 LAB — POCT CBC
Granulocyte percent: 72.2 %G (ref 37–80)
HCT, POC: 39.6 % (ref 37.7–47.9)
Hemoglobin: 13.7 g/dL (ref 12.2–16.2)
Lymph, poc: 2.6 (ref 0.6–3.4)
MCH, POC: 30.2 pg (ref 27–31.2)
MCHC: 34.6 g/dL (ref 31.8–35.4)
MCV: 87.6 fL (ref 80–97)
MID (cbc): 0.6 (ref 0–0.9)
MPV: 6.8 fL (ref 0–99.8)
POC Granulocyte: 8.4 — AB (ref 2–6.9)
POC LYMPH PERCENT: 22.5 %L (ref 10–50)
POC MID %: 5.3 %M (ref 0–12)
Platelet Count, POC: 310 10*3/uL (ref 142–424)
RBC: 4.53 M/uL (ref 4.04–5.48)
RDW, POC: 15.8 %
WBC: 11.6 10*3/uL — AB (ref 4.6–10.2)

## 2016-06-09 NOTE — Progress Notes (Signed)
Subjective:    Patient ID: [name], female    DOB: [birth year], 45 y.o.   MRN: [id number]  HPI Presents with 1 day history of abdominal cramps, diarrhea that turned into bloody diarrhea over the past few hours.  She had a history of this in the past.  She reports similar symptoms.  She denies fevers or chills.  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  She has been drinking fluids and has normal urination.  She has had 12 bowel movements today.  The last few have been bloody with mucus.  She denies chest pain, shortness of breath, palpitations, dizziness. Denies nausea, vomiting.  She is able to keep down fluids and food.     PMHx - Updated and reviewed.  Contributory factors include: Wegener's PSHx - Updated and reviewed.  Contributory factors include:  Negative FHx - Updated and reviewed.  Contributory factors include:  Negative Social Hx - Updated and reviewed. Contributory factors include: Negative Medications - reviewed, include methotrexate     Review of Systems  Constitutional: Negative for chills, fatigue and fever.  Respiratory: Negative for cough, chest tightness and shortness of breath.   Cardiovascular: Negative for chest pain, palpitations and leg swelling.  Gastrointestinal: Positive for abdominal pain, blood in stool and diarrhea. Negative for nausea and vomiting.  Genitourinary: Negative for decreased urine volume, difficulty urinating, dysuria and urgency.  Musculoskeletal: Negative for arthralgias and joint swelling.  Skin: Negative for rash and wound.  Psychiatric/Behavioral: Negative for agitation and confusion.  All other systems reviewed and are negative.      Objective:   Physical Exam  Constitutional: She is oriented to person, place, and time. She appears well-developed and well-nourished. No distress.  HENT:  Head: Normocephalic and atraumatic.  Right Ear: External ear normal.  Left Ear: External ear normal.  Neck: Normal  range of motion. Neck supple.  Cardiovascular: Regular rhythm, normal heart sounds and intact distal pulses.  Exam reveals no gallop and no friction rub.   No murmur heard. tachycardic  Pulmonary/Chest: Effort normal and breath sounds normal. No respiratory distress. She has no wheezes. She has no rales. She exhibits no tenderness.  Abdominal: Soft.  Mild diffuse soreness, no rebound or guarding.  Hyperactive bowel sounds  Musculoskeletal: Normal range of motion. She exhibits no edema.  Neurological: She is alert and oriented to person, place, and time.  Skin: Skin is warm. No rash noted. She is not diaphoretic. No erythema.  Psychiatric: She has a normal mood and affect. Her behavior is normal. Judgment and thought content normal.  Nursing note and vitals reviewed.     BP 124/80 (BP Location: Right Arm, Patient Position: Sitting, Cuff Size: Normal)   Pulse (!) 109   Temp 98.7 F (37.1 C) (Oral)   Resp 17   Ht 5' 3.5" (1.613 m)   Wt 164 lb (74.4 kg)   SpO2 97%   BMI 28.60 kg/m      Assessment & Plan:  Bloody diarrhea Will get stool culture, could be multiple etiologies.  Has been treated with cipro/flagyl in the past, so will have a low threshold for sending these in.  Follow up tomorrow.  Work note given for next 2 days.  CBC taken today.  Recommended increased fluid intake, especially in setting of mild tachycardia.  States she had a lot of coffee today, recommend decrease in this.  Gave precautions for going to ED- dizziness, uncontrollable diarrhea, high fevers, chills.

## 2016-06-09 NOTE — Assessment & Plan Note (Addendum)
Will get stool culture, could be multiple etiologies.  Has been treated with cipro/flagyl in the past, so will have a low threshold for sending these in.  Follow up tomorrow.  Work note given for next 2 days.  CBC taken today.  Recommended increased fluid intake, especially in setting of mild tachycardia.  States she had a lot of coffee today, recommend decrease in this.  Gave precautions for going to ED- dizziness, uncontrollable diarrhea, high fevers, chills.

## 2016-06-09 NOTE — Addendum Note (Signed)
Addended by: Jaynee Eagles on: 06/09/2016 06:22 PM   Modules accepted: Orders

## 2016-06-09 NOTE — Patient Instructions (Addendum)
  Please follow up tomorrow to make sure doing well.  Note written for out of work until Friday.    Go to ED if having dizziness, chest pain, palpitations, profuse diarrhea, decreased urine output.  Make sure to increase fluid intake.  Decrease coffee intake.     IF you received an x-ray today, you will receive an invoice from [facility]. Please contact [facility] at [phone] with questions or concerns regarding your invoice.   IF you received labwork today, you will receive an invoice from [employer]. Please contact [facility] at [phone] with questions or concerns regarding your invoice.   Our billing staff will not be able to assist you with questions regarding bills from these companies.  You will be contacted with the lab results as soon as they are available. The fastest way to get your results is to activate your My Chart account. Instructions are located on the last page of this paperwork. If you have not heard from [place] regarding the results in 2 weeks, please contact this office.

## 2016-06-10 ENCOUNTER — Ambulatory Visit (INDEPENDENT_AMBULATORY_CARE_PROVIDER_SITE_OTHER): Payer: BC Managed Care – PPO | Admitting: Family Medicine

## 2016-06-10 ENCOUNTER — Encounter: Payer: Self-pay | Admitting: Family Medicine

## 2016-06-10 VITALS — BP 108/84 | HR 93 | Temp 98.0°F | Resp 16 | Wt 161.0 lb

## 2016-06-10 DIAGNOSIS — A09 Infectious gastroenteritis and colitis, unspecified: Secondary | ICD-10-CM | POA: Diagnosis not present

## 2016-06-10 DIAGNOSIS — J4520 Mild intermittent asthma, uncomplicated: Secondary | ICD-10-CM | POA: Diagnosis not present

## 2016-06-10 DIAGNOSIS — K921 Melena: Secondary | ICD-10-CM

## 2016-06-10 DIAGNOSIS — M313 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: Secondary | ICD-10-CM | POA: Diagnosis not present

## 2016-06-10 DIAGNOSIS — R197 Diarrhea, unspecified: Secondary | ICD-10-CM

## 2016-06-10 LAB — POCT URINALYSIS DIP (MANUAL ENTRY)
Bilirubin, UA: NEGATIVE
Blood, UA: NEGATIVE
Glucose, UA: NEGATIVE
Ketones, POC UA: NEGATIVE
Leukocytes, UA: NEGATIVE
Nitrite, UA: NEGATIVE
Protein Ur, POC: NEGATIVE
Spec Grav, UA: 1.015
Urobilinogen, UA: 0.2
pH, UA: 5.5

## 2016-06-10 MED ORDER — ALBUTEROL SULFATE HFA 108 (90 BASE) MCG/ACT IN AERS: 2.0000 | INHALATION_SPRAY | Freq: Four times a day (QID) | RESPIRATORY_TRACT | 1 refills | Status: DC | PRN

## 2016-06-10 NOTE — Patient Instructions (Addendum)
IF you received an x-ray today, you will receive an invoice from [facility]. Please contact [facility] at [phone] with questions or concerns regarding your invoice.   IF you received labwork today, you will receive an invoice from [employer]. Please contact [facility] at [phone] with questions or concerns regarding your invoice.   Our billing staff will not be able to assist you with questions regarding bills from these companies.  You will be contacted with the lab results as soon as they are available. The fastest way to get your results is to activate your My Chart account. Instructions are located on the last page of this paperwork. If you have not heard from [place] regarding the results in 2 weeks, please contact this office.     Bloody Diarrhea Bloody diarrhea is frequent loose and watery bowel movements that contain blood. The blood can be hard to see (occult) or notice. Bloody diarrhea may be caused by medical conditions such as:  Ulcerative colitis.  Crohn disease.  Intestinal infection.  Viral gastroenteritis or bacterial gastroenteritis. Finding out why there is blood is in your diarrhea is necessary so your health care provider can prescribe the right treatment for you. Follow the instructions from your health care provider about treating the cause of your bloody diarrhea. Any type of diarrhea can make you feel weak and dehydrated. Dehydration can make you tired and thirsty, cause you to have a dry mouth, and decrease how often you urinate. Follow these instructions at home: Follow instructions from your health care provider about how to care for yourself at home. Eating and drinking Follow these recommendations as told by your health care provider:  Take an oral rehydration solution (ORS). This is a drink that is sold at pharmacies and retail stores.  Drink clear fluids such as water, ice chips, diluted fruit juice,  and low-calorie sports drinks.  Eat bland, easy-to-digest foods in small amounts as you are able. These foods include bananas, applesauce, rice, lean meats, toast, and crackers.  Avoid drinking fluids that contain a lot of sugar or caffeine, such as energy drinks, sports drinks, and soda.  Avoid alcohol.  Avoid spicy or fatty foods. General instructions  Drink enough fluid to keep your urine clear or pale yellow.  Wash your hands often. If soap and water are not available, use hand sanitizer.  Make sure that all people in your household wash their hands well and often.  Take over-the-counter and prescription medicines only as told by your health care provider.  Rest at home while you recover.  Take a warm bath to relieve any burning or pain from frequent diarrhea episodes.  Watch your condition for any changes.  Keep all follow-up visits as told by your health care provider. This is important. Contact a health care provider if:  You have a fever.  You have new symptoms.  Your diarrhea gets worse.  You cannot keep fluids down.  You have a headache.  You feel light-headed or dizzy.  You have muscle cramps. Get help right away if:  You have chest pain.  You feel extremely weak or you faint.  The blood in your diarrhea increases or turns a different color.  You vomit and the vomit is bloody or looks black.  You have persistent diarrhea.  You have severe pain, cramping, or bloating in your abdomen.  You have trouble breathing or you are breathing very quickly.  Your heart is beating very quickly.  Your skin feels cold or clammy.  You feel confused.  You have signs of dehydration, such as:  Dark urine, very little urine, or no urine.  Cracked lips.  Dry mouth.  Sunken eyes.  Sleepiness.  Weakness. This information is not intended to replace advice given to you by your health care provider. Make sure you discuss any questions you have with your  health care provider. Document Released: 06/22/2005 Document Revised: 10/31/2015 Document Reviewed: 02/26/2015 Elsevier Interactive Patient Education  2017 [employer].

## 2016-06-10 NOTE — Progress Notes (Signed)
Subjective:    Patient ID: [name], female    DOB: 1970-11-23, 45 y.o.   MRN: [id number]  06/10/2016  Follow-up (abdominal pain and bloody stools, pt states that she is not any better)   HPI This 45 y.o. female presents for evaluation of persistent bloody stools.  Onset two nights ago; developed abdominal cramps.  Yesterday, cramps really bad and had diarrhea.  Blood and mucous. Similar symptoms eleven years ago; diagnosed with colitis.  S/p colonoscopy that was normal; no ulcerative colitis or Crohn's disease.  Low grade fever with chills; no sweats. No headache.  +nausea; +vomiting several times yesterday; 3 times yesterday; able to drink and keep down fluids.  Had diarrhea x 12 at school; could not get a sub. Two episodes of diarrhea x 2 overnight; today 4 stools. Less blood; bright red blood.  Mucous small amount today.  Not watery stool today.  Abdominal cramps are horrible.  Liquids cause cramping.  No antibiotics.  No travel.  No camping.  No food poisoning.  No other sick contacts.  Kids at school with some kind of stomach bugs.  No abx prescribes; wanted to wait on culture.  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 Weanign off of [employer].    Wt Readings from Last 3 Encounters:  07/01/16 165 lb 12.8 oz (75.2 kg)  06/10/16 161 lb (73 kg)  06/09/16 164 lb (74.4 kg)      Review of Systems  Constitutional: Positive for chills, diaphoresis, fatigue and fever.  Gastrointestinal: Positive for abdominal distention, abdominal pain, blood in stool, diarrhea, nausea and vomiting. Negative for anal bleeding, constipation and rectal pain.  Neurological: Negative for dizziness, weakness, light-headedness, numbness and headaches.    Past Medical History:  Diagnosis Date  . Allergy    s/p allergy testing; allergic to dogs, cats, molds, horses.  . Anxiety   . Asthma   . Depression   . GERD (gastroesophageal reflux disease)   . Wegener's syndrome ([place])    [facility] and [place]   Past Surgical History:  Procedure Laterality Date  . ABDOMINAL HYSTERECTOMY  07/06/2008   CERVIX and OVARIES intact; DUB.  Marland Kitchen BREAST SURGERY  07/07/2007   Augmentation  . CARPAL TUNNEL RELEASE     R.  [name].  Marland Kitchen RHINOPLASTY     nasal fracture   Allergies  Allergen Reactions  . Ativan [Lorazepam] Other (See Comments)    Hyperactivity per patient  . Erythromycin Base Nausea And Vomiting  . Nitrofurantoin     REACTION: Nausea and vomiting   Current Outpatient Prescriptions  Medication Sig Dispense Refill  . Fluticasone-Salmeterol (ADVAIR DISKUS) 100-50 MCG/DOSE AEPB Inhale 1 puff into the lungs 2 (two) times daily. 60 each 11  . folic acid (FOLVITE) 1 MG tablet Take 1 tablet (1 mg total) by mouth daily.    . methotrexate (RHEUMATREX) 2.5 MG tablet Take 8 tablets (20 mg total) by mouth once a week. Caution:Chemotherapy. Protect from light. (Patient taking differently: Take 10 mg by mouth once a week. Caution:Chemotherapy. Protect from light.) 4 tablet 0  . montelukast (SINGULAIR) 10 MG tablet TAKE 1 TABLET BY MOUTH EVERY DAY 30 tablet 10  . pantoprazole (PROTONIX) 40 MG tablet TAKE 1 TABLET(40 MG) BY MOUTH DAILY 90 tablet 1  . sertraline (ZOLOFT) 50 MG tablet Take 200 mg by mouth daily.     Marland Kitchen albuterol (PROVENTIL HFA;VENTOLIN HFA) 108 (90 Base) MCG/ACT inhaler Inhale 2 puffs into the lungs every  6 (six) hours as needed for wheezing or shortness of breath (cough, shortness of breath or wheezing.). 1 Inhaler 1  . benzonatate (TESSALON) 100 MG capsule Take 1-2 capsules (100-200 mg total) by mouth 3 (three) times daily as needed for cough. 60 capsule 0  . cefdinir (OMNICEF) 300 MG capsule Take 2 capsules (600 mg total) by mouth daily. 20 capsule 0  . phentermine 15 MG capsule Take 1-2 capsules (15-30 mg total) by mouth every morning. 60 capsule 0  . predniSONE (DELTASONE) 20 MG tablet Take 3 PO QAM x 1 day, 2 PO QAM x 5 days, 1 PO QAM x 5 days 18 tablet 0     No current facility-administered medications for this visit.    Social History   Social History  . Marital status: Legally Separated    Spouse name: N/A  . Number of children: N/A  . Years of education: N/A   Occupational History  . Not on file.   Social History Main Topics  . Smoking status: Never Smoker  . Smokeless tobacco: Never Used  . Alcohol use Yes     Comment: Daily for a few weeks   . Drug use: No  . Sexual activity: Yes    Birth control/ protection: Surgical   Other Topics Concern  . Not on file   Social History Narrative   Marital status: separated in 11/2013; married x 22 years.  Dating x October 2016      Children:  2 children (14, 16).      Lives: with 1 children; other daughter living in [place] with father.      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      Tobacco:  None      Alcohol: socially; weekends.      Exercise: 3 times per week; aerobics; elliptical; [employer].      Seatbelt: 100%; no texting      Sexual activity:  Yes; no STDs.   Family History  Problem Relation Age of Onset  . Asthma Mother   . Depression Mother   . Diabetes Mother   . Hyperlipidemia Mother   . Hypertension Mother   . Mental illness Mother   . Arthritis Mother   . Heart disease Father 86    AMI/CABG  . Mental illness Sister        Objective:    BP 108/84 (BP Location: Left Arm, Patient Position: Sitting, Cuff Size: Normal)   Pulse 93   Temp 98 F (36.7 C)   Resp 16   Wt 161 lb (73 kg)   SpO2 99%   BMI 28.07 kg/m  Physical Exam  Constitutional: She is oriented to person, place, and time. She appears well-developed and well-nourished. No distress.  HENT:  Head: Normocephalic and atraumatic.  Nose: Nose normal.  Mouth/Throat: Oropharynx is clear and moist.  Eyes: Conjunctivae and EOM are normal. Pupils are equal, round, and reactive to light.  Neck: Normal  range of motion. Neck supple. Carotid bruit is not present. No thyromegaly present.  Cardiovascular: Normal rate, regular rhythm, normal heart sounds and intact distal pulses.  Exam reveals no gallop and no friction rub.   No murmur heard. Pulmonary/Chest: Effort normal and breath sounds normal. She has no wheezes. She has no rales.  Abdominal: Soft. Bowel sounds are normal. She exhibits no distension and no mass. There is no tenderness. There is no rebound and  no guarding.  Lymphadenopathy:    She has no cervical adenopathy.  Neurological: She is alert and oriented to person, place, and time. No cranial nerve deficit.  Skin: Skin is warm and dry. No rash noted. She is not diaphoretic. No erythema. No pallor.  Psychiatric: She has a normal mood and affect. Her behavior is normal.   Results for orders placed or performed in visit on 06/10/16  CBC with Differential/Platelet  Result Value Ref Range   WBC 7.9 3.4 - 10.8 x10E3/uL   RBC 4.39 3.77 - 5.28 x10E6/uL   Hemoglobin 13.0 11.1 - 15.9 g/dL   Hematocrit 38.7 34.0 - 46.6 %   MCV 88 79 - 97 fL   MCH 29.6 26.6 - 33.0 pg   MCHC 33.6 31.5 - 35.7 g/dL   RDW 15.5 (H) 12.3 - 15.4 %   Platelets 302 150 - 379 x10E3/uL   Neutrophils 61 Not Estab. %   Lymphs 28 Not Estab. %   Monocytes 8 Not Estab. %   Eos 3 Not Estab. %   Basos 0 Not Estab. %   Neutrophils Absolute 4.8 1.4 - 7.0 x10E3/uL   Lymphocytes Absolute 2.2 0.7 - 3.1 x10E3/uL   Monocytes Absolute 0.6 0.1 - 0.9 x10E3/uL   EOS (ABSOLUTE) 0.2 0.0 - 0.4 x10E3/uL   Basophils Absolute 0.0 0.0 - 0.2 x10E3/uL   Immature Granulocytes 0 Not Estab. %   Immature Grans (Abs) 0.0 0.0 - 0.1 x10E3/uL  Comprehensive metabolic panel  Result Value Ref Range   Glucose 97 65 - 99 mg/dL   BUN 11 6 - 24 mg/dL   Creatinine, Ser 0.69 0.57 - 1.00 mg/dL   GFR calc non Af Amer 105 >59 mL/min/1.73   GFR calc Af Amer 121 >59 mL/min/1.73   BUN/Creatinine Ratio 16 9 - 23   Sodium 140 134 - 144 mmol/L    Potassium 4.3 3.5 - 5.2 mmol/L   Chloride 101 96 - 106 mmol/L   CO2 23 18 - 29 mmol/L   Calcium 9.0 8.7 - 10.2 mg/dL   Total Protein 6.8 6.0 - 8.5 g/dL   Albumin 4.0 3.5 - 5.5 g/dL   Globulin, Total 2.8 1.5 - 4.5 g/dL   Albumin/Globulin Ratio 1.4 1.2 - 2.2   Bilirubin Total 0.5 0.0 - 1.2 mg/dL   Alkaline Phosphatase 66 39 - 117 IU/L   AST 26 0 - 40 IU/L   ALT 13 0 - 32 IU/L  Sedimentation rate  Result Value Ref Range   Sed Rate 14 0 - 32 mm/hr  POCT urinalysis dipstick  Result Value Ref Range   Color, UA yellow yellow   Clarity, UA clear clear   Glucose, UA negative negative   Bilirubin, UA negative negative   Ketones, POC UA negative negative   Spec Grav, UA 1.015    Blood, UA negative negative   pH, UA 5.5    Protein Ur, POC negative negative   Urobilinogen, UA 0.2    Nitrite, UA Negative Negative   Leukocytes, UA Negative Negative       Assessment & Plan:   1. Bloody stools   2. Diarrhea of presumed infectious origin   3. Mild intermittent asthma without complication   4. 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 -refill of Albuterol provided. -await stool culture results.   -repeat CBC due to bloody stools to rule out active GI bleed.   Orders Placed This Encounter  Procedures  .  CBC with Differential/Platelet  . Comprehensive metabolic panel  . Sedimentation rate  . Care order/instruction:    AVS printed - let patient go!  Marland Kitchen POCT urinalysis dipstick   Meds ordered this encounter  Medications  . albuterol (PROVENTIL HFA;VENTOLIN HFA) 108 (90 Base) MCG/ACT inhaler    Sig: Inhale 2 puffs into the lungs every 6 (six) hours as needed for wheezing or shortness of breath (cough, shortness of breath or wheezing.).    Dispense:  1 Inhaler    Refill:  1    No Follow-up on file.   Mikahla Wisor Elayne Guerin, M.D. [facility] [address] [place], [place]  [address] [phone]  phone [phone] fax

## 2016-06-11 LAB — CBC WITH DIFFERENTIAL/PLATELET
Basophils Absolute: 0 10*3/uL (ref 0.0–0.2)
Basos: 0 %
EOS (ABSOLUTE): 0.2 10*3/uL (ref 0.0–0.4)
Eos: 3 %
Hematocrit: 38.7 % (ref 34.0–46.6)
Hemoglobin: 13 g/dL (ref 11.1–15.9)
Immature Grans (Abs): 0 10*3/uL (ref 0.0–0.1)
Immature Granulocytes: 0 %
Lymphocytes Absolute: 2.2 10*3/uL (ref 0.7–3.1)
Lymphs: 28 %
MCH: 29.6 pg (ref 26.6–33.0)
MCHC: 33.6 g/dL (ref 31.5–35.7)
MCV: 88 fL (ref 79–97)
Monocytes Absolute: 0.6 10*3/uL (ref 0.1–0.9)
Monocytes: 8 %
Neutrophils Absolute: 4.8 10*3/uL (ref 1.4–7.0)
Neutrophils: 61 %
Platelets: 302 10*3/uL (ref 150–379)
RBC: 4.39 x10E6/uL (ref 3.77–5.28)
RDW: 15.5 % — ABNORMAL HIGH (ref 12.3–15.4)
WBC: 7.9 10*3/uL (ref 3.4–10.8)

## 2016-06-11 LAB — COMPREHENSIVE METABOLIC PANEL
ALT: 13 IU/L (ref 0–32)
AST: 26 IU/L (ref 0–40)
Albumin/Globulin Ratio: 1.4 (ref 1.2–2.2)
Albumin: 4 g/dL (ref 3.5–5.5)
Alkaline Phosphatase: 66 IU/L (ref 39–117)
BUN/Creatinine Ratio: 16 (ref 9–23)
BUN: 11 mg/dL (ref 6–24)
Bilirubin Total: 0.5 mg/dL (ref 0.0–1.2)
CO2: 23 mmol/L (ref 18–29)
Calcium: 9 mg/dL (ref 8.7–10.2)
Chloride: 101 mmol/L (ref 96–106)
Creatinine, Ser: 0.69 mg/dL (ref 0.57–1.00)
GFR calc Af Amer: 121 mL/min/{1.73_m2} (ref >59)
GFR calc non Af Amer: 105 mL/min/{1.73_m2} (ref >59)
Globulin, Total: 2.8 g/dL (ref 1.5–4.5)
Glucose: 97 mg/dL (ref 65–99)
Potassium: 4.3 mmol/L (ref 3.5–5.2)
Sodium: 140 mmol/L (ref 134–144)
Total Protein: 6.8 g/dL (ref 6.0–8.5)

## 2016-06-11 LAB — SEDIMENTATION RATE: Sed Rate: 14 mm/hr (ref 0–32)

## 2016-06-13 LAB — STOOL CULTURE: E coli, Shiga toxin Assay: NEGATIVE

## 2016-06-15 ENCOUNTER — Telehealth: Payer: Self-pay | Admitting: Student

## 2016-06-15 NOTE — Telephone Encounter (Signed)
Called and left VM that cultures negative and call back if symptoms not improved or has questions.   Signed,  Balinda Quails, DO [facility] 12:11 PM  06/15/16

## 2016-07-01 ENCOUNTER — Ambulatory Visit (INDEPENDENT_AMBULATORY_CARE_PROVIDER_SITE_OTHER): Payer: BC Managed Care – PPO | Admitting: Family Medicine

## 2016-07-01 ENCOUNTER — Encounter: Payer: Self-pay | Admitting: Family Medicine

## 2016-07-01 VITALS — BP 124/77 | HR 94 | Temp 98.8°F | Resp 16 | Ht 63.5 in | Wt 165.8 lb

## 2016-07-01 DIAGNOSIS — J4541 Moderate persistent asthma with (acute) exacerbation: Secondary | ICD-10-CM

## 2016-07-01 DIAGNOSIS — M313 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: Secondary | ICD-10-CM

## 2016-07-01 DIAGNOSIS — J0101 Acute recurrent maxillary sinusitis: Secondary | ICD-10-CM | POA: Diagnosis not present

## 2016-07-01 MED ORDER — BENZONATATE 100 MG PO CAPS: 100.0000 mg | ORAL_CAPSULE | Freq: Three times a day (TID) | ORAL | 0 refills | Status: DC | PRN

## 2016-07-01 MED ORDER — PREDNISONE 20 MG PO TABS: ORAL_TABLET | ORAL | 0 refills | Status: DC

## 2016-07-01 MED ORDER — CEFDINIR 300 MG PO CAPS: 600.0000 mg | ORAL_CAPSULE | Freq: Every day | ORAL | 0 refills | Status: DC

## 2016-07-01 NOTE — Progress Notes (Signed)
Subjective:    Patient ID: [name], female    DOB: [birth year], 44 y.o.   MRN: [id number]  07/01/2016  Cough (aches, deep productive cough, yellow plegm, headache since last Thursday)   HPI This 45 y.o. female presents for evaluation of cough, congestion. Started with sore throat one week ago.  Dry cough with progressive worsneing; runny nose; facial pain starting 06/29/16. Has deep cough.  No fever/chills; mild sweats.  +aching first day.  +HA whole time.  No ear pain; sore throat is scratchy.  +rhinorrhea; +nasal congestion yellow; not thick. Scant sputum at night and in am.   Yellow.  +SOB; chest tightness.  Wheezing some.  No v/d.  Taking Mucinex and Ibuprofen. Has not used Albuterol because when gets bad at night; Albuterol hypes up; using Advair bid.    Review of Systems  Constitutional: Negative for chills, diaphoresis, fatigue and fever.  HENT: Positive for congestion, postnasal drip, rhinorrhea, sinus pain, sinus pressure and sore throat. Negative for ear pain.   Eyes: Negative for visual disturbance.  Respiratory: Positive for cough, chest tightness and shortness of breath.   Cardiovascular: Negative for chest pain, palpitations and leg swelling.  Gastrointestinal: Negative for abdominal pain, constipation, diarrhea, nausea and vomiting.  Endocrine: Negative for cold intolerance, heat intolerance, polydipsia, polyphagia and polyuria.  Neurological: Positive for headaches. Negative for dizziness, tremors, seizures, syncope, facial asymmetry, speech difficulty, weakness, light-headedness and numbness.    Past Medical History:  Diagnosis Date  . Allergy    s/p allergy testing; allergic to dogs, cats, molds, horses.  . Anxiety   . Asthma   . Depression   . GERD (gastroesophageal reflux disease)   . 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 ABDOMINAL HYSTERECTOMY  07/06/2008   CERVIX and  OVARIES intact; DUB.  Marland Kitchen BREAST SURGERY  07/07/2007   Augmentation  . CARPAL TUNNEL RELEASE     R.  [name].  Marland Kitchen RHINOPLASTY     nasal fracture   Allergies  Allergen Reactions  . Ativan [Lorazepam] Other (See Comments)    Hyperactivity per patient  . Erythromycin Base Nausea And Vomiting  . Nitrofurantoin     REACTION: Nausea and vomiting    Social History   Social History  . Marital status: Legally Separated    Spouse name: N/A  . Number of children: N/A  . Years of education: N/A   Occupational History  . Not on file.   Social History Main Topics  . Smoking status: Never Smoker  . Smokeless tobacco: Never Used  . Alcohol use Yes     Comment: Daily for a few weeks   . Drug use: No  . Sexual activity: Yes    Birth control/ protection: Surgical   Other Topics Concern  . Not on file   Social History Narrative   Marital status: separated in 11/2013; married x 22 years.  Dating x October 2016      Children:  2 children (14, 16).      Lives: with 1 children; other daughter living in [place] with father.      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      Tobacco:  None      Alcohol: socially; weekends.      Exercise: 3 times per week; aerobics; elliptical; [employer].  Seatbelt: 100%; no texting      Sexual activity:  Yes; no STDs.   Family History  Problem Relation Age of Onset  . Asthma Mother   . Depression Mother   . Diabetes Mother   . Hyperlipidemia Mother   . Hypertension Mother   . Mental illness Mother   . Arthritis Mother   . Heart disease Father 24    AMI/CABG  . Mental illness Sister        Objective:    BP 124/77 (BP Location: Right Arm, Patient Position: Sitting, Cuff Size: Small)   Pulse 94   Temp 98.8 F (37.1 C) (Oral)   Resp 16   Ht 5' 3.5" (1.613 m)   Wt 165 lb 12.8 oz (75.2 kg)   SpO2 98%   BMI 28.91 kg/m  Physical Exam    Constitutional: She is oriented to person, place, and time. She appears well-developed and well-nourished. No distress.  HENT:  Head: Normocephalic and atraumatic.  Right Ear: Tympanic membrane, external ear and ear canal normal.  Left Ear: Tympanic membrane, external ear and ear canal normal.  Nose: Mucosal edema and rhinorrhea present. Right sinus exhibits maxillary sinus tenderness and frontal sinus tenderness. Left sinus exhibits maxillary sinus tenderness. Left sinus exhibits no frontal sinus tenderness.  Mouth/Throat: Oropharynx is clear and moist.  Eyes: Conjunctivae and EOM are normal. Pupils are equal, round, and reactive to light.  Neck: Normal range of motion. Neck supple. Carotid bruit is not present. No thyromegaly present.  Cardiovascular: Normal rate, regular rhythm, normal heart sounds and intact distal pulses.  Exam reveals no gallop and no friction rub.   No murmur heard. Pulmonary/Chest: Effort normal and breath sounds normal. She has no wheezes. She has no rales.  Abdominal: Soft. Bowel sounds are normal. She exhibits no distension and no mass. There is no tenderness. There is no rebound and no guarding.  Lymphadenopathy:    She has no cervical adenopathy.  Neurological: She is alert and oriented to person, place, and time. No cranial nerve deficit.  Skin: Skin is warm and dry. No rash noted. She is not diaphoretic. No erythema. No pallor.  Psychiatric: She has a normal mood and affect. Her behavior is normal.        Assessment & Plan:   1. Acute recurrent maxillary sinusitis   2. 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 Moderate persistent asthma with acute exacerbation    -New sinusitis with asthma exacerbation in patient with Wegeners. -rx for Omnicef, Prednisone, Tessalon Perles provided. -start Albuterol qid scheduled for one week and then PRN.   No orders of the defined types were placed in this encounter.  Meds ordered this encounter  Medications  .  cefdinir (OMNICEF) 300 MG capsule    Sig: Take 2 capsules (600 mg total) by mouth daily.    Dispense:  20 capsule    Refill:  0  . predniSONE (DELTASONE) 20 MG tablet    Sig: Take 3 PO QAM x 1 day, 2 PO QAM x 5 days, 1 PO QAM x 5 days    Dispense:  18 tablet    Refill:  0  . benzonatate (TESSALON) 100 MG capsule    Sig: Take 1-2 capsules (100-200 mg total) by mouth 3 (three) times daily as needed for cough.    Dispense:  60 capsule    Refill:  0    No Follow-up on file.   Hasaan Radde Elayne Guerin, M.D. [facility]  [place] [place], [place]  [address] [phone] phone [phone] fax

## 2016-07-01 NOTE — Patient Instructions (Addendum)
1. Increase Albuterol to three times daily before supper. 2. Start nasal saline irrigation.   Sinusitis, Adult Sinusitis is soreness and inflammation of your sinuses. Sinuses are hollow spaces in the bones around your face. Your sinuses are located:  Around your eyes.  In the middle of your forehead.  Behind your nose.  In your cheekbones. Your sinuses and nasal passages are lined with a stringy fluid (mucus). Mucus normally drains out of your sinuses. When your nasal tissues become inflamed or swollen, the mucus can become trapped or blocked so air cannot flow through your sinuses. This allows bacteria, viruses, and funguses to grow, which leads to infection. Sinusitis can develop quickly and last for 7?10 days (acute) or for more than 12 weeks (chronic). Sinusitis often develops after a cold. What are the causes? This condition is caused by anything that creates swelling in the sinuses or stops mucus from draining, including:  Allergies.  Asthma.  Bacterial or viral infection.  Abnormally shaped bones between the nasal passages.  Nasal growths that contain mucus (nasal polyps).  Narrow sinus openings.  Pollutants, such as chemicals or irritants in the air.  A foreign object stuck in the nose.  A fungal infection. This is rare. What increases the risk? The following factors may make you more likely to develop this condition:  Having allergies or asthma.  Having had a recent cold or respiratory tract infection.  Having structural deformities or blockages in your nose or sinuses.  Having a weak immune system.  Doing a lot of swimming or diving.  Overusing nasal sprays.  Smoking. What are the signs or symptoms? The main symptoms of this condition are pain and a feeling of pressure around the affected sinuses. Other symptoms include:  Upper toothache.  Earache.  Headache.  Bad breath.  Decreased sense of smell and taste.  A cough that may get worse at  night.  Fatigue.  Fever.  Thick drainage from your nose. The drainage is often green and it may contain pus (purulent).  Stuffy nose or congestion.  Postnasal drip. This is when extra mucus collects in the throat or back of the nose.  Swelling and warmth over the affected sinuses.  Sore throat.  Sensitivity to light. How is this diagnosed? This condition is diagnosed based on symptoms, a medical history, and a physical exam. To find out if your condition is acute or chronic, your health care provider may:  Look in your nose for signs of nasal polyps.  Tap over the affected sinus to check for signs of infection.  View the inside of your sinuses using an imaging device that has a light attached (endoscope). If your health care provider suspects that you have chronic sinusitis, you may also:  Be tested for allergies.  Have a sample of mucus taken from your nose (nasal culture) and checked for bacteria.  Have a mucus sample examined to see if your sinusitis is related to an allergy. If your sinusitis does not respond to treatment and it lasts longer than 8 weeks, you may have an MRI or CT scan to check your sinuses. These scans also help to determine how severe your infection is. In rare cases, a bone biopsy may be done to rule out more serious types of fungal sinus disease. How is this treated? Treatment for sinusitis depends on the cause and whether your condition is chronic or acute. If a virus is causing your sinusitis, your symptoms will go away on their own within 10  days. You may be given medicines to relieve your symptoms, including:  Topical nasal decongestants. They shrink swollen nasal passages and let mucus drain from your sinuses.  Antihistamines. These drugs block inflammation that is triggered by allergies. This can help to ease swelling in your nose and sinuses.  Topical nasal corticosteroids. These are nasal sprays that ease inflammation and swelling in your nose  and sinuses.  Nasal saline washes. These rinses can help to get rid of thick mucus in your nose. If your condition is caused by bacteria, you will be given an antibiotic medicine. If your condition is caused by a fungus, you will be given an antifungal medicine. Surgery may be needed to correct underlying conditions, such as narrow nasal passages. Surgery may also be needed to remove polyps. Follow these instructions at home: Medicines  Take, use, or apply over-the-counter and prescription medicines only as told by your health care provider. These may include nasal sprays.  If you were prescribed an antibiotic medicine, take it as told by your health care provider. Do not stop taking the antibiotic even if you start to feel better. Hydrate and Humidify  Drink enough water to keep your urine clear or pale yellow. Staying hydrated will help to thin your mucus.  Use a cool mist humidifier to keep the humidity level in your home above 50%.  Inhale steam for 10-15 minutes, 3-4 times a day or as told by your health care provider. You can do this in the bathroom while a hot shower is running.  Limit your exposure to cool or dry air. Rest  Rest as much as possible.  Sleep with your head raised (elevated).  Make sure to get enough sleep each night. General instructions  Apply a warm, moist washcloth to your face 3-4 times a day or as told by your health care provider. This will help with discomfort.  Wash your hands often with soap and water to reduce your exposure to viruses and other germs. If soap and water are not available, use hand sanitizer.  Do not smoke. Avoid being around people who are smoking (secondhand smoke).  Keep all follow-up visits as told by your health care provider. This is important. Contact a health care provider if:  You have a fever.  Your symptoms get worse.  Your symptoms do not improve within 10 days. Get help right away if:  You have a severe  headache.  You have persistent vomiting.  You have pain or swelling around your face or eyes.  You have vision problems.  You develop confusion.  Your neck is stiff.  You have trouble breathing. This information is not intended to replace advice given to you by your health care provider. Make sure you discuss any questions you have with your health care provider. Document Released: 06/22/2005 Document Revised: 02/16/2016 Document Reviewed: 04/17/2015 Elsevier Interactive Patient Education  2017 [employer].     IF you received an x-ray today, you will receive an invoice from [facility]. Please contact [facility] at [phone] with questions or concerns regarding your invoice.   IF you received labwork today, you will receive an invoice from [place]. Please contact [facility] at [phone] with questions or concerns regarding your invoice.   Our billing staff will not be able to assist you with questions regarding bills from these companies.  You will be contacted with the lab results as soon as they are available. The fastest way to get your results is to activate your My  Chart account. Instructions are located on the last page of this paperwork. If you have not heard from [place] regarding the results in 2 weeks, please contact this office.

## 2016-07-18 ENCOUNTER — Other Ambulatory Visit: Payer: Self-pay | Admitting: Family Medicine

## 2016-10-12 ENCOUNTER — Other Ambulatory Visit: Payer: Self-pay | Admitting: Family Medicine

## 2016-10-13 NOTE — Telephone Encounter (Signed)
06/2016 last ov

## 2016-11-06 ENCOUNTER — Telehealth: Payer: Self-pay | Admitting: Family Medicine

## 2016-11-06 NOTE — Telephone Encounter (Signed)
PATIENT WOULD LIKE DR. Tamala Julian TO KNOW THAT DR. KAPUR IN [place] IS READY TO RELEASE HER BUT SHE WANTS TO KNOW IF DR. Tamala Julian WILL TAKE UP PRESCRIBING HER TO CONTINUE TO HAVE ZOLOFT 200 MG? SHE TAKES IT 1 TIME A DAY. BEST PHONE [phone] (CELL) [place]

## 2016-11-06 NOTE — Telephone Encounter (Signed)
06/2016 last ov

## 2016-11-07 NOTE — Telephone Encounter (Signed)
Yes; I am happy to prescribe Sertraline for patient.  Please have her schedule a follow-up visit with me in the upcoming month.

## 2016-11-09 ENCOUNTER — Other Ambulatory Visit: Payer: Self-pay | Admitting: Family Medicine

## 2016-12-09 ENCOUNTER — Ambulatory Visit: Payer: BC Managed Care – PPO | Admitting: Family Medicine

## 2016-12-10 ENCOUNTER — Encounter: Payer: Self-pay | Admitting: Family Medicine

## 2016-12-10 ENCOUNTER — Ambulatory Visit (INDEPENDENT_AMBULATORY_CARE_PROVIDER_SITE_OTHER): Payer: BC Managed Care – PPO | Admitting: Family Medicine

## 2016-12-10 VITALS — BP 119/76 | HR 81 | Temp 97.8°F | Resp 18 | Ht 63.0 in | Wt 161.4 lb

## 2016-12-10 DIAGNOSIS — L989 Disorder of the skin and subcutaneous tissue, unspecified: Secondary | ICD-10-CM

## 2016-12-10 DIAGNOSIS — Z0000 Encounter for general adult medical examination without abnormal findings: Secondary | ICD-10-CM | POA: Diagnosis not present

## 2016-12-10 NOTE — Progress Notes (Addendum)
[name] is a 46 y.o. female who presents to [facility] today for comprehensive physical examination:  CPE  Concerns:  "Spot" on her Right arm.  Present "for years."  Has not grown or changed color or size but does have a fairly consistent scab. No bleeding or itching.  She is moving to [place] to start a new job there.  She requires and administered a physical before starting in her new [place].  She has fairly extensive past medical history. Please see below for full details. This includes history of asthma, depression requiring hospitalization, Wegener's granulomatosis, anxiety. She is on chronic methotrexate for her Wegener's. This is followed by [facility]. She has every 6 month CBCs for this. Last was one month ago. And within normal limits.  Last physical last year Tetanus 2014 Flu vaccine last year Eye exam:  n/a Dental exam every six months.   PMH reviewed. Patient is a nonsmoker.   Past Medical History:  Diagnosis Date  . Allergy    s/p allergy testing; allergic to dogs, cats, molds, horses.  . Anxiety   . Asthma   . Depression   . GERD (gastroesophageal reflux disease)   . 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 ABDOMINAL HYSTERECTOMY  07/06/2008   CERVIX and OVARIES intact; DUB.  Marland Kitchen BREAST SURGERY  07/07/2007   Augmentation  . CARPAL TUNNEL RELEASE     R.  [name].  Marland Kitchen RHINOPLASTY     nasal fracture    Medications reviewed. Current Outpatient Prescriptions  Medication Sig Dispense Refill  . albuterol (PROVENTIL HFA;VENTOLIN HFA) 108 (90 Base) MCG/ACT inhaler Inhale 2 puffs into the lungs every 6 (six) hours as needed for wheezing or shortness of breath (cough, shortness of breath or wheezing.). 1 Inhaler 1  . benzonatate (TESSALON) 100 MG capsule Take 1-2 capsules (100-200 mg total) by mouth 3 (three) times daily as needed for cough. 60 capsule 0  . cefdinir  (OMNICEF) 300 MG capsule Take 2 capsules (600 mg total) by mouth daily. 20 capsule 0  . Fluticasone-Salmeterol (ADVAIR DISKUS) 100-50 MCG/DOSE AEPB Inhale 1 puff into the lungs 2 (two) times daily. 60 each 11  . folic acid (FOLVITE) 1 MG tablet Take 1 tablet (1 mg total) by mouth daily.    . methotrexate (RHEUMATREX) 2.5 MG tablet Take 8 tablets (20 mg total) by mouth once a week. Caution:Chemotherapy. Protect from light. (Patient taking differently: Take 10 mg by mouth once a week. Caution:Chemotherapy. Protect from light.) 4 tablet 0  . montelukast (SINGULAIR) 10 MG tablet TAKE 1 TABLET BY MOUTH EVERY DAY 30 tablet 10  . pantoprazole (PROTONIX) 40 MG tablet TAKE 1 TABLET BY MOUTH EVERY DAY 90 tablet 3  . phentermine 15 MG capsule Take 1-2 capsules (15-30 mg total) by mouth every morning. 60 capsule 0  . predniSONE (DELTASONE) 20 MG tablet Take 3 PO QAM x 1 day, 2 PO QAM x 5 days, 1 PO QAM x 5 days 18 tablet 0  . sertraline (ZOLOFT) 100 MG tablet TAKE 2 TABLETS BY MOUTH DAILY 60 tablet 0  . sertraline (ZOLOFT) 50 MG tablet Take 200 mg by mouth daily.      No current facility-administered medications for this visit.     Social: Smoking history:  denies Alcohol use:  Socially/infrequently Illicit drug use:  denies Relationship status:   Divorced. Occupation:  Works as a Pharmacist, hospital  Family History:  Mother with diabetes, hypercholesterol, hypertension. Otherwise denies.  Review of Systems  Constitutional: Negative for fever.  HENT: Negative for congestion, ear discharge, ear pain and hearing loss.   Eyes: Negative for blurred vision.  Respiratory: Negative for cough and wheezing.   Cardiovascular: Negative for chest pain, palpitations and leg swelling.  Gastrointestinal: Negative for nausea, vomiting and abdominal pain.  Genitourinary: Negative for dysuria, hematuria and flank pain.  Musculoskeletal: Negative for neck pain.  Skin: Negative for rash.  Neurological: Negative for dizziness  and headaches.  Psychiatric/Behavioral: Negative for depression and suicidal ideas.   Exam: BP 119/76 (BP Location: Right Arm, Patient Position: Sitting, Cuff Size: Normal)   Pulse 81   Temp 97.8 F (36.6 C) (Oral)   Resp 18   Ht 5\' 3"  (1.6 m)   Wt 161 lb 6.4 oz (73.2 kg)   SpO2 98%   BMI 28.59 kg/m  Gen:  Alert, cooperative patient who appears stated age in no acute distress.  Vital signs reviewed. Head: [place]/AT.   Eyes:  EOMI, PERRL.   Ears:  External ears WNL, Bilateral TM's normal without retraction, redness or bulging. Nose:  Septum midline -- chronic perforation of posterior septum.  Non-erythematous and non-bleeding.  Mouth:  MMM, tonsils non-erythematous, non-edematous.   Neck: No masses or thyromegaly or limitation in range of motion.  No cervical lymphadenopathy. Pulm:  Clear to auscultation bilaterally with good air movement.  No wheezes or rales noted.   Cardiac:  Regular rate and rhythm without murmur auscultated.  Good S1/S2. Abd:  Soft/nondistended/nontender.  Good bowel sounds throughout all four quadrants.  No masses noted.  Skin:  Roughly 1-2 mm in diameter hyperpigmented lesion on the volar aspect right forearm. Does have rough texture similar to a scab. Punch biopsy performed. Ext:  No clubbing/cyanosis/erythema.  No edema noted bilateral lower extremities.   Neuro:  Grossly normal, no gait abnormalities Psych:  Not depressed or anxious appearing.  Conversant and engaged  Impression/Plan: 1. Complete Physical Examination: anticipatory guidance provided.  - No concerns today. 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 This however should not be limitation for her work. -Completed her work administered physical. -She is up-to-date on her health maintenance. She told me she had a Pap smear several months ago with her OB/GYN.  2.  Arm lesion: - punch performed today - await biopsy results - looks like actinic keratosis

## 2016-12-10 NOTE — Progress Notes (Signed)
PROCEDURE NOTE: Punch biopsy Verbal consent obtain from the patient.  Skin cleansed with alcohol pad, then local anesthesia with 1 cc 1% lidocaine with epinephrine. Site cleansed with betadine x 3.   A 58mm punch biopsy performed and specimen sent for pathology review. Hemostasis achieved with pressure. Bandage applied.  Local wound care reviewed.  Tenna Delaine, PA-C  [facility] 12/10/2016 5:19 PM

## 2016-12-10 NOTE — Patient Instructions (Addendum)
It was very good to see you today.   I have completed your paperwork for you.   We will let you know the results of your biopsy.    Best of luck next year!  -----------------------------------------------  We recommend that you schedule a mammogram for breast cancer screening. Typically, you do not need a referral to do this. Please contact a local imaging center to schedule your mammogram.  [facility] - [phone]  *ask for the Radiology Department [facility] ([place]) - [phone] or [phone]  [facility] - [phone] [place] [phone] [facility] - [phone]  *ask for the [facility] - [phone]  *ask for the Radiology Department [facility] - [phone]  *ask for the [place] - [phone]  IF you received an x-ray today, you will receive an invoice from [facility] Radiology. Please contact [facility] at [phone] with questions or concerns regarding your invoice.   IF you received labwork today, you will receive an invoice from [place]. Please contact [facility] at [phone] with questions or concerns regarding your invoice.   Our billing staff will not be able to assist you with questions regarding bills from these companies.  You will be contacted with the lab results as soon as they are available. The fastest way to get your results is to activate your My Chart account. Instructions are located on the last page of this paperwork. If you have not heard from [place] regarding the results in 2 weeks, please contact this office.

## 2016-12-17 ENCOUNTER — Telehealth: Payer: Self-pay | Admitting: Family Medicine

## 2016-12-17 DIAGNOSIS — IMO0002 Reserved for concepts with insufficient information to code with codable children: Secondary | ICD-10-CM

## 2016-12-17 NOTE — Telephone Encounter (Signed)
Attempted to call patient and discuss results of biopsy.  Call went to voicemail.  I asked she call back.  I will be around until lunch today and can talk with her then or talk to her tomorrow.

## 2016-12-18 NOTE — Telephone Encounter (Signed)
Late note:  Patient returned call yesterday.  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  Discussed what this is and means.  Plan referral to dermatology to see if entire lesion has been removed and full skin exam.  Patient appreciative of call

## 2016-12-22 ENCOUNTER — Other Ambulatory Visit: Payer: Self-pay | Admitting: Family Medicine

## 2017-01-24 ENCOUNTER — Other Ambulatory Visit: Payer: Self-pay | Admitting: Family Medicine

## 2017-02-12 ENCOUNTER — Other Ambulatory Visit: Payer: Self-pay | Admitting: Family Medicine

## 2017-03-14 ENCOUNTER — Other Ambulatory Visit: Payer: Self-pay | Admitting: Family Medicine

## 2017-05-26 ENCOUNTER — Other Ambulatory Visit: Payer: Self-pay | Admitting: Family Medicine

## 2017-07-26 ENCOUNTER — Other Ambulatory Visit: Payer: Self-pay | Admitting: Family Medicine

## 2017-10-05 ENCOUNTER — Other Ambulatory Visit: Payer: Self-pay | Admitting: Family Medicine

## 2017-10-23 ENCOUNTER — Other Ambulatory Visit: Payer: Self-pay | Admitting: Family Medicine

## 2017-10-25 NOTE — Telephone Encounter (Signed)
Refill for pantoprazole Last refill was 07/26/17 Prescription expired on 10/13/17  Provider  Zannie Cove, MD Pharmacy  [facility] [phone]   [place], [place]  Please review.

## 2017-10-26 ENCOUNTER — Other Ambulatory Visit: Payer: Self-pay | Admitting: Family Medicine

## 2017-11-25 ENCOUNTER — Encounter: Payer: Self-pay | Admitting: Family Medicine

## 2017-12-05 ENCOUNTER — Other Ambulatory Visit: Payer: Self-pay | Admitting: Family Medicine

## 2017-12-07 NOTE — Telephone Encounter (Signed)
pantoprazole refill Last Refill:10/26/17 # 30 Last OV: cannot find recent appt where problem was addressed PCP: [name] MD [place]

## 2018-01-04 ENCOUNTER — Other Ambulatory Visit: Payer: Self-pay | Admitting: Family Medicine

## 2018-01-04 NOTE — Telephone Encounter (Signed)
Protonix 40 mg refill request  LOV 06/2016 with Dr. Tamala Julian.    Needs appt.

## 2018-08-25 LAB — HM MAMMOGRAPHY

## 2019-12-22 ENCOUNTER — Other Ambulatory Visit: Payer: Self-pay | Admitting: Physician Assistant

## 2020-01-29 ENCOUNTER — Other Ambulatory Visit: Payer: Self-pay | Admitting: Internal Medicine

## 2020-06-17 NOTE — Progress Notes (Signed)
New patient visit   Patient: [name]   DOB: 11/04/1970   49 y.o. Female  MRN: [id number] Visit Date: 06/18/2020  Today's healthcare provider: Marcille Buffy, FNP   Chief Complaint  Patient presents with  . New Patient (Initial Visit)   Subjective    [name] is a 49 y.o. female who presents today as a new patient to establish care.  HPI  Patient comes to our office from [facility], she states that she feels well today. Patient would like to discuss getting genetic counseling, she states that her mother and maternal aunt both died of pancreatic cancer in the past year. Maternal aunt was age 76. Mom died 6 weeks ago, pancreatic cancer.    Patient reports that she follows a well balanced diet ad exercises daily, patient reports that sleep habits are well and on average sleeps up to 6 hrs a night.    She has had colonoscopy at age 44 . Had history of colitis.   She had PAP 1.5 year ago and was within normal limits.   Patient  denies any fever,chills, rash, chest pain, shortness of breath, nausea, vomiting, or diarrhea.  Denies dizziness, lightheadedness, pre syncopal or syncopal episodes.     Past Medical History:  Diagnosis Date  . Allergy    s/p allergy testing; allergic to dogs, cats, molds, horses.  . Anxiety   . Asthma   . Depression   . GERD (gastroesophageal reflux disease)   . Wegener's syndrome    [facility]   Past Surgical History:  Procedure Laterality Date  . ABDOMINAL HYSTERECTOMY  07/06/2008   CERVIX and OVARIES intact; DUB.  Marland Kitchen BREAST SURGERY  07/07/2007   Augmentation  . CARPAL TUNNEL RELEASE     R.  [name].  Marland Kitchen RHINOPLASTY     nasal fracture   Family Status  Relation Name Status  . Mother  Alive       2  . Father  Alive       22  . Sister  Alive  . Daughter  (Not Specified)  . Mat Aunt  (Not Specified)  . MGM  (Not Specified)  . MGF  (Not Specified)   Family History  Problem  Relation Age of Onset  . Asthma Mother   . Depression Mother   . Diabetes Mother   . Hyperlipidemia Mother   . Hypertension Mother   . Mental illness Mother   . Arthritis Mother   . Heart disease Father 27       AMI/CABG  . Mental illness Sister   . Myasthenia gravis Daughter   . Charcot-Marie-Tooth disease Daughter   . ADD / ADHD Daughter   . Pancreatic cancer Maternal Aunt   . Alzheimer's disease Maternal Grandmother   . Hyperlipidemia Maternal Grandmother   . Hypertension Maternal Grandmother   . Heart attack Maternal Grandfather   . Lung cancer Maternal Grandfather    Social History   Socioeconomic History  . Marital status: Legally Separated    Spouse name: Not on file  . Number of children: Not on file  . Years of education: Not on file  . Highest education level: Not on file  Occupational History  . Not on file  Tobacco Use  . Smoking status: Never Smoker  . Smokeless tobacco: Never Used  Substance and Sexual Activity  . Alcohol use: Yes    Comment: Daily for a few weeks   . Drug use:  No  . Sexual activity: Yes    Birth control/protection: Surgical  Other Topics Concern  . Not on file  Social History Narrative   Marital status: separated in 11/2013; married x 22 years.  Dating x October 2016      Children:  2 children (14, 16).      Lives: with 1 children; other daughter living in [place] with father.      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      Tobacco:  None      Alcohol: socially; weekends.      Exercise: 3 times per week; aerobics; elliptical; [employer].      Seatbelt: 100%; no texting      Sexual activity:  Yes; no STDs.   Social Determinants of Health   Financial Resource Strain: Not on file  Food Insecurity: Not on file  Transportation Needs: Not on file  Physical Activity: Not on file  Stress: Not on file  Social Connections: Not on file    Outpatient Medications Prior to Visit  Medication Sig  . [DISCONTINUED] buPROPion (WELLBUTRIN XL) 150 MG 24 hr tablet Take by mouth.  . ADVAIR DISKUS 100-50 MCG/DOSE AEPB INHALE 1 PUFF INTO THE LUNGS TWICE DAILY  . benzonatate (TESSALON) 100 MG capsule Take 1-2 capsules (100-200 mg total) by mouth 3 (three) times daily as needed for cough.  . cefdinir (OMNICEF) 300 MG capsule Take 2 capsules (600 mg total) by mouth daily.  . methotrexate (RHEUMATREX) 2.5 MG tablet Take 8 tablets (20 mg total) by mouth once a week. Caution:Chemotherapy. Protect from light. (Patient taking differently: Take 10 mg by mouth once a week. Caution:Chemotherapy. Protect from light.)  . montelukast (SINGULAIR) 10 MG tablet TAKE 1 TABLET BY MOUTH EVERY DAY  . phentermine 15 MG capsule Take 1-2 capsules (15-30 mg total) by mouth every morning.  . predniSONE (DELTASONE) 20 MG tablet Take 3 PO QAM x 1 day, 2 PO QAM x 5 days, 1 PO QAM x 5 days  . sertraline (ZOLOFT) 100 MG tablet TAKE 2 TABLET BY MOUTH DAILY. OFFICE VISIT NEEDED  . [DISCONTINUED] albuterol (PROVENTIL HFA;VENTOLIN HFA) 108 (90 Base) MCG/ACT inhaler Inhale 2 puffs into the lungs every 6 (six) hours as needed for wheezing or shortness of breath (cough, shortness of breath or wheezing.).  . [DISCONTINUED] folic acid (FOLVITE) 1 MG tablet Take 1 tablet (1 mg total) by mouth daily.  . [DISCONTINUED] pantoprazole (PROTONIX) 40 MG tablet TAKE 1 TABLET BY MOUTH EVERY DAY   No facility-administered medications prior to visit.   Allergies  Allergen Reactions  . Ativan [Lorazepam] Other (See Comments)    Hyperactivity per patient  . Erythromycin Base Nausea And Vomiting  . Nitrofurantoin     REACTION: Nausea and vomiting    Immunization History  Administered Date(s) Administered  . Influenza Split 04/06/2015  . Influenza,inj,Quad PF,6+ Mos 05/06/2014  . PFIZER SARS-COV-2 Vaccination 07/13/2019, 08/03/2019  . Pneumococcal Polysaccharide-23 07/08/2014  . Td  04/01/2005  . Tdap 07/06/2012    Health Maintenance  Topic Date Due  . Hepatitis C Screening  Never done  . PAP SMEAR-Modifier  Never done  . MAMMOGRAM  12/09/2019  . COLONOSCOPY  Never done  . COVID-19 Vaccine (3 - Booster for Pfizer series) 01/31/2020  . INFLUENZA VACCINE  02/04/2020  . TETANUS/TDAP  07/06/2022  . HIV Screening  Completed    Patient Care Team: Laverna Peace  S, FNP as PCP - General (Family Medicine)  Review of Systems  Constitutional: Positive for fatigue. Negative for activity change, appetite change, chills, diaphoresis, fever and unexpected weight change.  HENT: Positive for hearing loss. Negative for congestion, dental problem and nosebleeds.   Respiratory: Negative.   Cardiovascular: Negative.   Gastrointestinal: Negative.   Endocrine: Negative.   Genitourinary: Negative.   Musculoskeletal: Positive for arthralgias.  Neurological: Negative.   Hematological: Negative.   Psychiatric/Behavioral: Negative.  Negative for self-injury, sleep disturbance and suicidal ideas.  All other systems reviewed and are negative.     Objective    BP (!) 107/53   Pulse 75   Temp 98.2 F (36.8 C) (Oral)   Resp 15   Ht 5\' 3"  (1.6 m)   Wt 162 lb 9.6 oz (73.8 kg)   SpO2 100%   BMI 28.80 kg/m  Physical Exam Vitals reviewed.  Constitutional:      General: She is not in acute distress.    Appearance: Normal appearance. She is well-developed. She is not diaphoretic.     Interventions: She is not intubated.    Comments: Patient is alert and oriented and responsive to questions Engages in eye contact with provider. Speaks in full sentences without any pauses without any shortness of breath or distress.    HENT:     Head: Normocephalic and atraumatic.     Right Ear: External ear normal.     Left Ear: External ear normal.     Nose: Nose normal.     Mouth/Throat:     Pharynx: No oropharyngeal exudate.  Eyes:     General: Lids are normal. No scleral icterus.        Right eye: No discharge.        Left eye: No discharge.     Conjunctiva/sclera: Conjunctivae normal.     Right eye: Right conjunctiva is not injected. No exudate or hemorrhage.    Left eye: Left conjunctiva is not injected. No exudate or hemorrhage.    Pupils: Pupils are equal, round, and reactive to light.  Neck:     Thyroid: No thyroid mass or thyromegaly.     Vascular: Normal carotid pulses. No carotid bruit, hepatojugular reflux or JVD.     Trachea: Trachea and phonation normal. No tracheal tenderness or tracheal deviation.     Meningeal: Brudzinski's sign and Kernig's sign absent.  Cardiovascular:     Rate and Rhythm: Normal rate and regular rhythm.     Pulses: Normal pulses.          Radial pulses are 2+ on the right side and 2+ on the left side.       Dorsalis pedis pulses are 2+ on the right side and 2+ on the left side.       Posterior tibial pulses are 2+ on the right side and 2+ on the left side.     Heart sounds: Normal heart sounds, S1 normal and S2 normal. Heart sounds not distant. No murmur heard. No friction rub. No gallop.   Pulmonary:     Effort: Pulmonary effort is normal. No tachypnea, bradypnea, accessory muscle usage or respiratory distress. She is not intubated.     Breath sounds: Normal breath sounds. No stridor. No wheezing or rales.  Chest:     Chest wall: No tenderness.  Breasts:     Right: No supraclavicular adenopathy.     Left: No supraclavicular adenopathy.    Abdominal:     General: Bowel sounds  are normal. There is no distension or abdominal bruit.     Palpations: Abdomen is soft. There is no shifting dullness, fluid wave, hepatomegaly, splenomegaly, mass or pulsatile mass.     Tenderness: There is no abdominal tenderness. There is no guarding or rebound.     Hernia: No hernia is present.  Musculoskeletal:        General: No tenderness or deformity. Normal range of motion.     Cervical back: Full passive range of motion without pain, normal  range of motion and neck supple. No edema, erythema or rigidity. No spinous process tenderness or muscular tenderness. Normal range of motion.  Lymphadenopathy:     Head:     Right side of head: No submental, submandibular, tonsillar, preauricular, posterior auricular or occipital adenopathy.     Left side of head: No submental, submandibular, tonsillar, preauricular, posterior auricular or occipital adenopathy.     Cervical: No cervical adenopathy.     Right cervical: No superficial, deep or posterior cervical adenopathy.    Left cervical: No superficial, deep or posterior cervical adenopathy.     Upper Body:     Right upper body: No supraclavicular or pectoral adenopathy.     Left upper body: No supraclavicular or pectoral adenopathy.  Skin:    General: Skin is warm and dry.     Coloration: Skin is not pale.     Findings: No abrasion, bruising, burn, ecchymosis, erythema, lesion, petechiae or rash.     Nails: There is no clubbing.  Neurological:     Mental Status: She is alert and oriented to person, place, and time.     GCS: GCS eye subscore is 4. GCS verbal subscore is 5. GCS motor subscore is 6.     Cranial Nerves: No cranial nerve deficit.     Sensory: No sensory deficit.     Motor: No tremor, atrophy, abnormal muscle tone or seizure activity.     Coordination: Coordination normal.     Gait: Gait normal.     Deep Tendon Reflexes: Reflexes are normal and symmetric. Reflexes normal. Babinski sign absent on the right side. Babinski sign absent on the left side.     Reflex Scores:      Tricep reflexes are 2+ on the right side and 2+ on the left side.      Bicep reflexes are 2+ on the right side and 2+ on the left side.      Brachioradialis reflexes are 2+ on the right side and 2+ on the left side.      Patellar reflexes are 2+ on the right side and 2+ on the left side.      Achilles reflexes are 2+ on the right side and 2+ on the left side. Psychiatric:        Speech: Speech normal.         Behavior: Behavior normal.        Thought Content: Thought content normal.        Judgment: Judgment normal.     Depression Screen PHQ 2/9 Scores 06/18/2020 02/07/2014 01/10/2014  PHQ - 2 Score 2 4 3   PHQ- 9 Score 6 19 16   Some encounter information is confidential and restricted. Go to Review Flowsheets activity to see all data.   Results for orders placed or performed in visit on 06/18/20  POCT urinalysis dipstick  Result Value Ref Range   Color, UA Yellow    Clarity, UA clear    Glucose, UA Negative Negative  Bilirubin, UA negative    Ketones, UA negative    Spec Grav, UA <=1.005 (A) 1.010 - 1.025   Blood, UA negative    pH, UA 5.0 5.0 - 8.0   Protein, UA Negative Negative   Urobilinogen, UA 0.2 0.2 or 1.0 E.U./dL   Nitrite, UA negative    Leukocytes, UA Negative Negative   Appearance     Odor      Assessment & Plan       Routine health maintenance  Encounter for screening mammogram for malignant neoplasm of breast  Screening for colon cancer - Plan: Ambulatory referral to Gastroenterology  Family history of malignant neoplasm of pancreas - Plan: CBC with Differential/Platelet, Ambulatory referral to Genetics  History of partial hysterectomy- has cervix and both ovaries due  to heavy menses.   Vitamin D insufficiency - Plan: CBC with Differential/Platelet, VITAMIN D 25 Hydroxy (Vit-D Deficiency, Fractures)  Screening for blood or protein in urine - Plan: POCT urinalysis dipstick  Wegner's osteochondritis - Plan: CBC with Differential/Platelet, Comprehensive metabolic panel, Lipid panel, TSH, Ambulatory referral to Rheumatology, Amylase, Lipase  Abnormal mammogram - Plan: MM Digital Diagnostic Bilat   Orders Placed This Encounter  Procedures  . MM Digital Diagnostic Bilat    Standing Status:   Future    Standing Expiration Date:   06/18/2021    Order Specific Question:   Reason for Exam (SYMPTOM  OR DIAGNOSIS REQUIRED)    Answer:   history of  abnormal, mammogram last 12/2019 and 03/31/2019 [facility].    Order Specific Question:   Is the patient pregnant?    Answer:   No    Order Specific Question:   Preferred imaging location?    Answer:   [facility]  . CBC with Differential/Platelet  . Comprehensive metabolic panel  . Lipid panel  . TSH  . VITAMIN D 25 Hydroxy (Vit-D Deficiency, Fractures)  . Amylase  . Lipase  . Ambulatory referral to Gastroenterology    Referral Priority:   Routine    Referral Type:   Consultation    Referral Reason:   Specialty Services Required    Referred to Provider:   Lucilla Lame, MD    Number of Visits Requested:   1  . Ambulatory referral to Genetics    Referral Priority:   Routine    Referral Type:   Consultation    Referral Reason:   Specialty Services Required    Number of Visits Requested:   1  . Ambulatory referral to Rheumatology    Referral Priority:   Routine    Referral Type:   Consultation    Referral Reason:   Specialty Services Required    Referred to Provider:   Quintin Alto, MD    Requested Specialty:   Rheumatology    Number of Visits Requested:   1  . POCT urinalysis dipstick    Meds ordered this encounter  Medications  . DISCONTD: folic acid (FOLVITE) 1 MG tablet    Sig: Take 1 tablet (1 mg total) by mouth daily.    Dispense:  90 tablet    Refill:  2  . Vitamin D, Ergocalciferol, (DRISDOL) 1.25 MG (50000 UNIT) CAPS capsule    Sig: Take 1 capsule (50,000 Units total) by mouth every 7 (seven) days. (taking one tablet per week) walk in lab in office 1-2 weeks after completing prescription.    Dispense:  12 capsule    Refill:  0  . buPROPion (  WELLBUTRIN XL) 150 MG 24 hr tablet    Sig: Take 1 tablet (150 mg total) by mouth daily.    Dispense:  90 tablet    Refill:  3  . folic acid (FOLVITE) 1 MG tablet    Sig: Take 1 tablet (1 mg total) by mouth daily.    Dispense:  90 tablet    Refill:  3  . pantoprazole (PROTONIX) 40 MG tablet    Sig:  Take 1 tablet (40 mg total) by mouth daily.    Dispense:  90 tablet    Refill:  2    Pt will need to establish with another provider.  Dr. Tamala Julian is leaving practice end of July.  Marland Kitchen albuterol (VENTOLIN HFA) 108 (90 Base) MCG/ACT inhaler    Sig: Inhale 2 puffs into the lungs every 6 (six) hours as needed for wheezing or shortness of breath (cough, shortness of breath or wheezing.).    Dispense:  1 each    Refill:  1   Red Flags discussed. The patient was given clear instructions to go to ER or return to medical center if any red flags develop, symptoms do not improve, worsen or new problems develop. They verbalized understanding.  Return in about 3 months (around 09/16/2020), or if symptoms worsen or fail to improve, for at any time for any worsening symptoms, Go to Emergency room/ urgent care if worse.     The entirety of the information documented in the History of Present Illness, Review of Systems and Physical Exam were personally obtained by me. Portions of this information were initially documented by the CMA and reviewed by me for thoroughness and accuracy.      Marcille Buffy, [place] [phone] (phone) [phone] (fax)  [place]

## 2020-06-18 ENCOUNTER — Ambulatory Visit (INDEPENDENT_AMBULATORY_CARE_PROVIDER_SITE_OTHER): Payer: 59 | Admitting: Adult Health

## 2020-06-18 ENCOUNTER — Encounter: Payer: Self-pay | Admitting: Adult Health

## 2020-06-18 ENCOUNTER — Other Ambulatory Visit: Payer: Self-pay

## 2020-06-18 VITALS — BP 107/53 | HR 75 | Temp 98.2°F | Resp 15 | Ht 63.0 in | Wt 162.6 lb

## 2020-06-18 DIAGNOSIS — Z0000 Encounter for general adult medical examination without abnormal findings: Secondary | ICD-10-CM | POA: Diagnosis not present

## 2020-06-18 DIAGNOSIS — Z1211 Encounter for screening for malignant neoplasm of colon: Secondary | ICD-10-CM

## 2020-06-18 DIAGNOSIS — A5002 Early congenital syphilitic osteochondropathy: Secondary | ICD-10-CM

## 2020-06-18 DIAGNOSIS — Z90711 Acquired absence of uterus with remaining cervical stump: Secondary | ICD-10-CM | POA: Diagnosis not present

## 2020-06-18 DIAGNOSIS — M9080 Osteopathy in diseases classified elsewhere, unspecified site: Secondary | ICD-10-CM

## 2020-06-18 DIAGNOSIS — R928 Other abnormal and inconclusive findings on diagnostic imaging of breast: Secondary | ICD-10-CM

## 2020-06-18 DIAGNOSIS — Z800 Family history of malignant neoplasm of digestive organs: Secondary | ICD-10-CM | POA: Diagnosis not present

## 2020-06-18 DIAGNOSIS — F419 Anxiety disorder, unspecified: Secondary | ICD-10-CM | POA: Insufficient documentation

## 2020-06-18 DIAGNOSIS — Z1389 Encounter for screening for other disorder: Secondary | ICD-10-CM | POA: Diagnosis not present

## 2020-06-18 DIAGNOSIS — E559 Vitamin D deficiency, unspecified: Secondary | ICD-10-CM | POA: Diagnosis not present

## 2020-06-18 DIAGNOSIS — J4520 Mild intermittent asthma, uncomplicated: Secondary | ICD-10-CM | POA: Insufficient documentation

## 2020-06-18 DIAGNOSIS — Z1231 Encounter for screening mammogram for malignant neoplasm of breast: Secondary | ICD-10-CM

## 2020-06-18 LAB — POCT URINALYSIS DIPSTICK
Bilirubin, UA: NEGATIVE
Blood, UA: NEGATIVE
Glucose, UA: NEGATIVE
Ketones, UA: NEGATIVE
Leukocytes, UA: NEGATIVE
Nitrite, UA: NEGATIVE
Protein, UA: NEGATIVE
Spec Grav, UA: <=1.005 — AB (ref 1.010–1.025)
Urobilinogen, UA: 0.2 E.U./dL
pH, UA: 5 (ref 5.0–8.0)

## 2020-06-18 MED ORDER — VITAMIN D (ERGOCALCIFEROL) 1.25 MG (50000 UNIT) PO CAPS
50000.0000 [IU] | ORAL_CAPSULE | ORAL | 0 refills | Status: DC
Start: 2020-06-18 — End: 2021-08-20
  Filled 2020-10-21: qty 12, 84d supply, fill #0

## 2020-06-18 MED ORDER — FOLIC ACID 1 MG PO TABS: 1.0000 mg | ORAL_TABLET | Freq: Every day | ORAL | 2 refills | Status: DC

## 2020-06-18 NOTE — Patient Instructions (Signed)
Call to schedule your screening mammogram. Your orders have been placed for your exam.  Let our office know if you have questions, concerns, or any difficulty scheduling.  If normal results then yearly screening mammograms are recommended unless you notice  Changes in your breast then you should schedule a follow up office visit. If abnormal results  Further imaging will be warranted and sooner follow up as determined by the radiologist at the [facility].   [facility] at [facility] [place], [place] [address]  Main: [phone]

## 2020-06-20 ENCOUNTER — Other Ambulatory Visit (HOSPITAL_COMMUNITY)
Admission: RE | Admit: 2020-06-20 | Discharge: 2020-06-20 | Disposition: A | Discharge status: Home / Self Care | Payer: 59 | Source: Other Acute Inpatient Hospital | Attending: Adult Health | Admitting: Adult Health

## 2020-06-20 DIAGNOSIS — E559 Vitamin D deficiency, unspecified: Secondary | ICD-10-CM | POA: Insufficient documentation

## 2020-06-20 DIAGNOSIS — A5002 Early congenital syphilitic osteochondropathy: Secondary | ICD-10-CM | POA: Diagnosis not present

## 2020-06-20 DIAGNOSIS — M9080 Osteopathy in diseases classified elsewhere, unspecified site: Secondary | ICD-10-CM | POA: Diagnosis not present

## 2020-06-20 DIAGNOSIS — Z800 Family history of malignant neoplasm of digestive organs: Secondary | ICD-10-CM | POA: Diagnosis not present

## 2020-06-20 LAB — COMPREHENSIVE METABOLIC PANEL
ALT: 19 U/L (ref 0–44)
AST: 30 U/L (ref 15–41)
Albumin: 4.2 g/dL (ref 3.5–5.0)
Alkaline Phosphatase: 66 U/L (ref 38–126)
Anion gap: 8 (ref 5–15)
BUN: 24 mg/dL — ABNORMAL HIGH (ref 6–20)
CO2: 26 mmol/L (ref 22–32)
Calcium: 9.3 mg/dL (ref 8.9–10.3)
Chloride: 101 mmol/L (ref 98–111)
Creatinine, Ser: 0.64 mg/dL (ref 0.44–1.00)
GFR, Estimated: >60 mL/min (ref >60)
Glucose, Bld: 99 mg/dL (ref 70–99)
Potassium: 4.2 mmol/L (ref 3.5–5.1)
Sodium: 135 mmol/L (ref 135–145)
Total Bilirubin: 1 mg/dL (ref 0.3–1.2)
Total Protein: 7.3 g/dL (ref 6.5–8.1)

## 2020-06-20 LAB — CBC WITH DIFFERENTIAL/PLATELET
Abs Immature Granulocytes: 0.02 10*3/uL (ref 0.00–0.07)
Basophils Absolute: 0 10*3/uL (ref 0.0–0.1)
Basophils Relative: 1 %
Eosinophils Absolute: 0.3 10*3/uL (ref 0.0–0.5)
Eosinophils Relative: 5 %
HCT: 40.5 % (ref 36.0–46.0)
Hemoglobin: 13 g/dL (ref 12.0–15.0)
Immature Granulocytes: 0 %
Lymphocytes Relative: 33 %
Lymphs Abs: 2 10*3/uL (ref 0.7–4.0)
MCH: 30.9 pg (ref 26.0–34.0)
MCHC: 32.1 g/dL (ref 30.0–36.0)
MCV: 96.2 fL (ref 80.0–100.0)
Monocytes Absolute: 0.5 10*3/uL (ref 0.1–1.0)
Monocytes Relative: 8 %
Neutro Abs: 3.1 10*3/uL (ref 1.7–7.7)
Neutrophils Relative %: 53 %
Platelets: 273 10*3/uL (ref 150–400)
RBC: 4.21 MIL/uL (ref 3.87–5.11)
RDW: 14 % (ref 11.5–15.5)
WBC: 6 10*3/uL (ref 4.0–10.5)
nRBC: 0 % (ref 0.0–0.2)

## 2020-06-20 LAB — LIPASE, BLOOD: Lipase: 30 U/L (ref 11–51)

## 2020-06-20 LAB — LIPID PANEL
Cholesterol: 304 mg/dL — ABNORMAL HIGH (ref 0–200)
HDL: 102 mg/dL (ref >40)
LDL Cholesterol: 186 mg/dL — ABNORMAL HIGH (ref 0–99)
Total CHOL/HDL Ratio: 3 RATIO
Triglycerides: 81 mg/dL (ref <150)
VLDL: 16 mg/dL (ref 0–40)

## 2020-06-20 LAB — TSH: TSH: 1.497 u[IU]/mL (ref 0.350–4.500)

## 2020-06-20 LAB — VITAMIN D 25 HYDROXY (VIT D DEFICIENCY, FRACTURES): Vit D, 25-Hydroxy: 72.98 ng/mL (ref 30–100)

## 2020-06-20 LAB — AMYLASE: Amylase: 36 U/L (ref 28–100)

## 2020-06-22 NOTE — Progress Notes (Signed)
  Vitamin D level is within normal limits. TSH for thyroid is within normal limits. Total cholesterol and LDL elevated. Discuss lifestyle modification with patient e.g. increase exercise, fiber, fruits, vegetables, lean meat, and omega 3/fish intake and decrease saturated fat. If patient following strict diet and exercise program already please schedule follow up appointment with primary care physician. CMP is okay. Normal limits. Amylase and lipase are within normal limits. CBC is within also normal limits.

## 2020-06-24 ENCOUNTER — Other Ambulatory Visit: Payer: Self-pay | Admitting: Adult Health

## 2020-06-24 ENCOUNTER — Encounter: Payer: Self-pay | Admitting: Adult Health

## 2020-06-24 MED ORDER — ALBUTEROL SULFATE HFA 108 (90 BASE) MCG/ACT IN AERS: 2.0000 | INHALATION_SPRAY | Freq: Four times a day (QID) | RESPIRATORY_TRACT | 1 refills | Status: DC | PRN

## 2020-06-24 MED ORDER — BUPROPION HCL ER (XL) 150 MG PO TB24: 150.0000 mg | ORAL_TABLET | Freq: Every day | ORAL | 3 refills | Status: DC

## 2020-06-24 MED ORDER — FOLIC ACID 1 MG PO TABS: 1.0000 mg | ORAL_TABLET | Freq: Every day | ORAL | 3 refills | Status: DC

## 2020-06-24 MED ORDER — PANTOPRAZOLE SODIUM 40 MG PO TBEC: 40.0000 mg | DELAYED_RELEASE_TABLET | Freq: Every day | ORAL | 2 refills | Status: DC

## 2020-07-01 ENCOUNTER — Inpatient Hospital Stay: Payer: 59 | Admitting: Licensed Clinical Social Worker

## 2020-07-01 ENCOUNTER — Inpatient Hospital Stay: Payer: 59

## 2020-07-06 DIAGNOSIS — Z8616 Personal history of COVID-19: Secondary | ICD-10-CM

## 2020-07-06 HISTORY — DX: Personal history of COVID-19: Z86.16

## 2020-07-15 ENCOUNTER — Encounter: Payer: 59 | Admitting: Licensed Clinical Social Worker

## 2020-07-15 ENCOUNTER — Other Ambulatory Visit: Payer: Self-pay

## 2020-07-15 ENCOUNTER — Other Ambulatory Visit: Payer: 59

## 2020-07-15 ENCOUNTER — Inpatient Hospital Stay: Payer: 59 | Attending: Oncology | Admitting: Licensed Clinical Social Worker

## 2020-07-15 DIAGNOSIS — Z800 Family history of malignant neoplasm of digestive organs: Secondary | ICD-10-CM

## 2020-07-15 NOTE — Progress Notes (Signed)
REFERRING PROVIDER: Doreen [name], [address],  [place] [address]  PRIMARY PROVIDER:  Doreen Beam, FNP  PRIMARY REASON FOR VISIT:  1. Family history of pancreatic cancer   2. Family history of colon cancer    I connected with [name] on 07/15/2020 at 3:00 PM EDT by MyChart video conference and verified that I am speaking with the correct person using two identifiers.    Patient location: home Provider location: [place]:   [name], a 50 y.o. female, was seen for a [facility] cancer genetics consultation at the request of Dr. Claudina [name] due to a family history of pancreatic cancer.  [name] presents to clinic today to discuss the possibility of a hereditary predisposition to cancer, genetic testing, and to further clarify her future cancer risks, as well as potential cancer risks for family members.    [name] is a 50 y.o. female with no personal history of cancer.    CANCER HISTORY:  Oncology History   No history exists.    Past Medical History:  Diagnosis Date  . Allergy    s/p allergy testing; allergic to dogs, cats, molds, horses.  . Anxiety   . Asthma   . Depression   . GERD (gastroesophageal reflux disease)   . Wegener's syndrome    [facility]    Past Surgical History:  Procedure Laterality Date  . ABDOMINAL HYSTERECTOMY  07/06/2008   CERVIX and OVARIES intact; DUB.  Marland Kitchen BREAST SURGERY  07/07/2007   Augmentation  . CARPAL TUNNEL RELEASE     R.  [name].  Marland Kitchen RHINOPLASTY     nasal fracture    Social History   Socioeconomic History  . Marital status: Legally Separated    Spouse name: Not on file  . Number of children: Not on file  . Years of education: Not on file  . Highest education level: Not on file  Occupational History  . Not on file  Tobacco Use  . Smoking status: Never Smoker  . Smokeless tobacco: Never Used  Substance and Sexual  Activity  . Alcohol use: Yes    Comment: Daily for a few weeks   . Drug use: No  . Sexual activity: Yes    Birth control/protection: Surgical  Other Topics Concern  . Not on file  Social History Narrative   Marital status: separated in 11/2013; married x 22 years.  Dating x October 2016      Children:  2 children (14, 16).      Lives: with 1 children; other daughter living in [place] with father.      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      Tobacco:  None      Alcohol: socially; weekends.      Exercise: 3 times per week; aerobics; elliptical; [employer].      Seatbelt: 100%; no texting      Sexual activity:  Yes; no STDs.   Social Determinants of Health   Financial Resource Strain: Not on file  Food Insecurity: Not on file  Transportation Needs: Not on file  Physical Activity: Not on file  Stress: Not on file  Social Connections: Not on file     FAMILY HISTORY:  We obtained a detailed, 4-generation family history.  Significant diagnoses are listed below: Family History  Problem  Relation Age of Onset  . Asthma Mother   . Depression Mother   . Diabetes Mother   . Hyperlipidemia Mother   . Hypertension Mother   . Mental illness Mother   . Arthritis Mother   . Heart disease Father 47       AMI/CABG  . Mental illness Sister   . Myasthenia gravis Daughter   . Charcot-Marie-Tooth disease Daughter   . ADD / ADHD Daughter   . Pancreatic cancer Maternal Aunt   . Alzheimer's disease Maternal Grandmother   . Hyperlipidemia Maternal Grandmother   . Hypertension Maternal Grandmother   . Heart attack Maternal Grandfather   . Lung cancer Maternal Grandfather    [name] has 1 maternal half sister who recently had genetic testing and was found to have a VUS in APC. Patient has 2 daughters.   [name] mother had pancreatic cancer and died at 18. She had a maternal half sister who  died of pancreatic cancer at 38. This individual's son had colon cancer. [name] maternal great grandmother also had colon cancer. Maternal grandfather had lung cancer.   [name] does not have information about her paternal side of the family.  [name] is unaware of previous family history of genetic testing for hereditary cancer risks. Patient's maternal ancestors are of unknown descent, and paternal ancestors are of unknown descent. There no reported Ashkenazi Jewish ancestry. There no known consanguinity.    GENETIC COUNSELING ASSESSMENT: [name] is a 50 y.o. female with a family history of pancreatic cancer which is somewhat suggestive of a hereditary cancer syndrome and predisposition to cancer. We, therefore, discussed and recommended the following at today's visit.   DISCUSSION: We discussed that approximately 5-10% of pancreatic cancer is hereditary  Most cases of hereditary pancreatic cancer are associated with BRCA1/BRCA2 genes, although there are other genes associated with hereditary cancer as well. We discussed that testing is beneficial for several reasons including knowing about other cancer risks, identifying potential screening and risk-reduction options that may be appropriate, and to understand if other family members could be at risk for cancer and allow them to undergo genetic testing.   We reviewed the characteristics, features and inheritance patterns of hereditary cancer syndromes. We also discussed genetic testing, including the appropriate family members to test, the process of testing, insurance coverage and turn-around-time for results. We discussed the implications of a negative, positive and/or variant of uncertain significant result. We recommended [name] pursue genetic testing for the [facility] CancerNext-Expanded+RNA gene panel.   Based on [name]'s family history of cancer, she meets medical criteria for genetic testing. Despite that she meets criteria, she may  still have an out of pocket cost. We discussed that if her out of pocket cost for testing is over $100, the laboratory will call and confirm whether she wants to proceed with testing.  If the out of pocket cost of testing is less than $100 she will be billed by the genetic testing laboratory.   PLAN: After considering the risks, benefits, and limitations, [name] provided informed consent to pursue genetic testing and the blood sample was sent to [facility] for analysis of the CancerNext-Expanded+RNA panel. Results should be available within approximately 2-3 weeks' time, at which point they will be disclosed by telephone to [name], as will any additional recommendations warranted by these results. [name] will receive a summary of her genetic counseling visit and a copy of her results once available. This information will  also be available in Epic.   [name] questions were answered to her satisfaction today. Our contact information was provided should additional questions or concerns arise. Thank you for the referral and allowing [place] to share in the care of your patient.   Faith Rogue, MS, [facility] Genetic Counselor [email or web address] Phone: [phone]  The patient was seen for a total of 25 minutes in face-to-face genetic counseling.  Dr. Grayland Ormond was available for discussion regarding this case.   _______________________________________________________________________ For Office Staff:  Number of people involved in session: 1 Was an Intern/ student involved with case: no

## 2020-07-16 ENCOUNTER — Inpatient Hospital Stay: Payer: 59

## 2020-07-18 DIAGNOSIS — Z800 Family history of malignant neoplasm of digestive organs: Secondary | ICD-10-CM | POA: Diagnosis not present

## 2020-08-20 DIAGNOSIS — L570 Actinic keratosis: Secondary | ICD-10-CM | POA: Diagnosis not present

## 2020-08-20 DIAGNOSIS — L905 Scar conditions and fibrosis of skin: Secondary | ICD-10-CM | POA: Diagnosis not present

## 2020-08-20 DIAGNOSIS — L720 Epidermal cyst: Secondary | ICD-10-CM | POA: Diagnosis not present

## 2020-08-20 DIAGNOSIS — D225 Melanocytic nevi of trunk: Secondary | ICD-10-CM | POA: Diagnosis not present

## 2020-08-20 DIAGNOSIS — X32XXXA Exposure to sunlight, initial encounter: Secondary | ICD-10-CM | POA: Diagnosis not present

## 2020-08-20 DIAGNOSIS — Z1283 Encounter for screening for malignant neoplasm of skin: Secondary | ICD-10-CM | POA: Diagnosis not present

## 2020-09-10 ENCOUNTER — Telehealth: Payer: Self-pay | Admitting: Licensed Clinical Social Worker

## 2020-09-10 ENCOUNTER — Encounter: Payer: Self-pay | Admitting: Licensed Clinical Social Worker

## 2020-09-10 ENCOUNTER — Ambulatory Visit: Payer: Self-pay | Admitting: Licensed Clinical Social Worker

## 2020-09-10 DIAGNOSIS — Z1379 Encounter for other screening for genetic and chromosomal anomalies: Secondary | ICD-10-CM

## 2020-09-10 DIAGNOSIS — Z800 Family history of malignant neoplasm of digestive organs: Secondary | ICD-10-CM

## 2020-09-10 NOTE — Telephone Encounter (Signed)
Revealed negative genetic testing.  This normal result is reassuring.  It is unlikely that there is an increased risk of cancer due to a mutation in one of these genes.  However, genetic testing is not perfect, and cannot definitively rule out a hereditary cause.  It will be important for her to keep in contact with genetics to learn if any additional testing may be needed in the future.      

## 2020-09-10 NOTE — Progress Notes (Signed)
HPI:  [name] was previously seen in the [place] clinic due to a family history of pancreatic cancer and concerns regarding a hereditary predisposition to cancer. Please refer to our prior cancer genetics clinic note for more information regarding our discussion, assessment and recommendations, at the time. [name] recent genetic test results were disclosed to her, as were recommendations warranted by these results. These results and recommendations are discussed in more detail below.  CANCER HISTORY:  Oncology History   No history exists.    FAMILY HISTORY:  We obtained a detailed, 4-generation family history.  Significant diagnoses are listed below: Family History  Problem Relation Age of Onset  . Asthma Mother   . Depression Mother   . Diabetes Mother   . Hyperlipidemia Mother   . Hypertension Mother   . Mental illness Mother   . Arthritis Mother   . Heart disease Father 55       AMI/CABG  . Mental illness Sister   . Myasthenia gravis Daughter   . Charcot-Marie-Tooth disease Daughter   . ADD / ADHD Daughter   . Pancreatic cancer Maternal Aunt   . Alzheimer's disease Maternal Grandmother   . Hyperlipidemia Maternal Grandmother   . Hypertension Maternal Grandmother   . Heart attack Maternal Grandfather   . Lung cancer Maternal Grandfather     [name] has 1 maternal half sister who recently had genetic testing and was found to have a VUS in APC. Patient has 2 daughters.   [name] mother had pancreatic cancer and died at 46. She had a maternal half sister who died of pancreatic cancer at 44. This individual's son had colon cancer. [name] maternal great grandmother also had colon cancer. Maternal grandfather had lung cancer.   [name] does not have information about her paternal side of the family.  [name] is unaware of previous family history of genetic testing for hereditary cancer risks. Patient's maternal ancestors are of unknown descent,  and paternal ancestors are of unknown descent. There no reported Ashkenazi Jewish ancestry. There no known consanguinity.    GENETIC TEST RESULTS: Genetic testing reported out on 09/10/2020 through the [facility] CancerNext-Expanded+RNA cancer panel found no pathogenic mutations.   The CancerNext-Expanded + RNAinsight gene panel offered by [facility] and includes sequencing and rearrangement analysis for the following 77 genes: IP, ALK, APC*, ATM*, AXIN2, BAP1, BARD1, BLM, BMPR1A, BRCA1*, BRCA2*, BRIP1*, CDC73, CDH1*,CDK4, CDKN1B, CDKN2A, CHEK2*, CTNNA1, DICER1, FANCC, FH, FLCN, GALNT12, KIF1B, LZTR1, MAX, MEN1, MET, MLH1*, MSH2*, MSH3, MSH6*, MUTYH*, NBN, NF1*, NF2, NTHL1, PALB2*, PHOX2B, PMS2*, POT1, PRKAR1A, PTCH1, PTEN*, RAD51C*, RAD51D*,RB1, RECQL, RET, SDHA, SDHAF2, SDHB, SDHC, SDHD, SMAD4, SMARCA4, SMARCB1, SMARCE1, STK11, SUFU, TMEM127, TP53*,TSC1, TSC2, VHL and XRCC2 (sequencing and deletion/duplication); EGFR, EGLN1, HOXB13, KIT, MITF, PDGFRA, POLD1 and POLE (sequencing only); EPCAM and GREM1 (deletion/duplication only).   The test report has been scanned into EPIC and is located under the Molecular Pathology section of the Results Review tab.  A portion of the result report is included below for reference.     We discussed with [name] that because current genetic testing is not perfect, it is possible there may be a gene mutation in one of these genes that current testing cannot detect, but that chance is small.  We also discussed, that there could be another gene that has not yet been discovered, or that we have not yet tested, that is responsible for the cancer diagnoses in the family. It is also  possible there is a hereditary cause for the cancer in the family that [name] did not inherit and therefore was not identified in her testing.  Therefore, it is important to remain in touch with cancer genetics in the future so that we can continue to offer [name] the most up to date genetic  testing.   ADDITIONAL GENETIC TESTING: We discussed with [name] that her genetic testing was fairly extensive.  If there are genes identified to increase cancer risk that can be analyzed in the future, we would be happy to discuss and coordinate this testing at that time.    CANCER SCREENING RECOMMENDATIONS: [name] test result is considered negative (normal).  This means that we have not identified a hereditary cause for her family history of cancer at this time.   While reassuring, this does not definitively rule out a hereditary predisposition to cancer. It is still possible that there could be genetic mutations that are undetectable by current technology. There could be genetic mutations in genes that have not been tested or identified to increase cancer risk.  Therefore, it is recommended she continue to follow the cancer management and screening guidelines provided by her primary healthcare provider.   An individual's cancer risk and medical management are not determined by genetic test results alone. Overall cancer risk assessment incorporates additional factors, including personal medical history, family history, and any available genetic information that may result in a personalized plan for cancer prevention and surveillance.   RECOMMENDATIONS FOR FAMILY MEMBERS:  Relatives in this family might be at some increased risk of developing cancer, over the general population risk, simply due to the family history of cancer.  We recommended female relatives in this family have a yearly mammogram beginning at age 33, or 17 years younger than the earliest onset of cancer, an annual clinical breast exam, and perform monthly breast self-exams. Female relatives in this family should also have a gynecological exam as recommended by their primary provider.  All family members should be referred for colonoscopy starting at age 31.    It is also possible there is a hereditary cause for the cancer in Ms.  [name]'s family that she did not inherit and therefore was not identified in her.  Based on [name]'s family history, we recommended maternal relatives have genetic counseling and testing. [name] will let us know if we can be of any assistance in coordinating genetic counseling and/or testing for these family members.  FOLLOW-UP: Lastly, we discussed with [name] that cancer genetics is a rapidly advancing field and it is possible that new genetic tests will be appropriate for her and/or her family members in the future. We encouraged her to remain in contact with cancer genetics on an annual basis so we can update her personal and family histories and let her know of advances in cancer genetics that may benefit this family.   Our contact number was provided. [name] questions were answered to her satisfaction, and she knows she is welcome to call us at anytime with additional questions or concerns.   Faith Rogue, MS, [facility] Genetic Counselor [email or web address] Phone: [phone]

## 2020-09-12 ENCOUNTER — Other Ambulatory Visit: Payer: Self-pay | Admitting: Adult Health

## 2020-09-12 NOTE — Telephone Encounter (Signed)
Requested medications are due for refill today.  yes  Requested medications are on the active medications list.  yes  Last refill. 06/18/2020  Future visit scheduled.   yes  Notes to clinic.  Medicine not delegated.

## 2020-09-13 ENCOUNTER — Ambulatory Visit
Admission: RE | Admit: 2020-09-13 | Discharge: 2020-09-13 | Disposition: A | Discharge status: Home / Self Care | Payer: 59 | Source: Ambulatory Visit | Attending: Adult Health | Admitting: Adult Health

## 2020-09-13 ENCOUNTER — Encounter: Payer: Self-pay | Admitting: Adult Health

## 2020-09-13 ENCOUNTER — Ambulatory Visit (INDEPENDENT_AMBULATORY_CARE_PROVIDER_SITE_OTHER): Payer: 59 | Admitting: Adult Health

## 2020-09-13 ENCOUNTER — Other Ambulatory Visit: Payer: Self-pay | Admitting: Adult Health

## 2020-09-13 ENCOUNTER — Other Ambulatory Visit: Payer: Self-pay

## 2020-09-13 VITALS — BP 106/70 | HR 88 | Temp 98.1°F | Resp 15

## 2020-09-13 DIAGNOSIS — N838 Other noninflammatory disorders of ovary, fallopian tube and broad ligament: Secondary | ICD-10-CM | POA: Diagnosis not present

## 2020-09-13 DIAGNOSIS — N83202 Unspecified ovarian cyst, left side: Secondary | ICD-10-CM | POA: Diagnosis not present

## 2020-09-13 DIAGNOSIS — N8189 Other female genital prolapse: Secondary | ICD-10-CM | POA: Diagnosis not present

## 2020-09-13 DIAGNOSIS — N812 Incomplete uterovaginal prolapse: Secondary | ICD-10-CM

## 2020-09-13 DIAGNOSIS — N8110 Cystocele, unspecified: Secondary | ICD-10-CM

## 2020-09-13 DIAGNOSIS — K449 Diaphragmatic hernia without obstruction or gangrene: Secondary | ICD-10-CM | POA: Diagnosis not present

## 2020-09-13 DIAGNOSIS — R300 Dysuria: Secondary | ICD-10-CM | POA: Diagnosis not present

## 2020-09-13 DIAGNOSIS — R9389 Abnormal findings on diagnostic imaging of other specified body structures: Secondary | ICD-10-CM

## 2020-09-13 DIAGNOSIS — N83292 Other ovarian cyst, left side: Secondary | ICD-10-CM | POA: Diagnosis not present

## 2020-09-13 LAB — POCT URINALYSIS DIPSTICK
Bilirubin, UA: NEGATIVE
Blood, UA: NEGATIVE
Glucose, UA: NEGATIVE
Ketones, UA: NEGATIVE
Leukocytes, UA: NEGATIVE
Nitrite, UA: NEGATIVE
Protein, UA: NEGATIVE
Spec Grav, UA: <=1.005 — AB (ref 1.010–1.025)
Urobilinogen, UA: 0.2 E.U./dL
pH, UA: 5 (ref 5.0–8.0)

## 2020-09-13 LAB — POCT I-STAT CREATININE: Creatinine, Ser: 0.5 mg/dL (ref 0.44–1.00)

## 2020-09-13 MED ORDER — IOHEXOL 300 MG/ML  SOLN
100.0000 mL | Freq: Once | INTRAMUSCULAR | Status: AC | PRN
  Administered 2020-09-13: 100 mL via INTRAVENOUS

## 2020-09-13 NOTE — Progress Notes (Signed)
Creatinine within normal limits.

## 2020-09-13 NOTE — Progress Notes (Signed)
Established patient visit   Patient: [name]   DOB: 28-May-1971   50 y.o. Female  MRN: [id number] Visit Date: 09/13/2020  Today's healthcare provider: Marcille Buffy, FNP   Chief Complaint  Patient presents with  . Pelvic Pain   Subjective    Pelvic Pain The patient's primary symptoms include pelvic pain. This is a new problem. The current episode started in the past 7 days (patient reports last night she felt like somethisng "dropped" and reports she had a lot of pelvic pressure). The problem has been gradually worsening. The problem affects the right side. Associated symptoms include abdominal pain, back pain, diarrhea, dysuria, frequency and nausea. Pertinent negatives include no anorexia, chills, constipation, discolored urine, fever, flank pain, headaches, hematuria, joint pain, joint swelling, painful intercourse, rash, sore throat, urgency or vomiting. The symptoms are aggravated by bowel movements (movement). She has tried nothing for the symptoms. She is sexually active.    She has had partial hysterectomy no uterus. Has cervix and ovaries.  G4P2 she lost 2 at preterm labor.   Denies any bleeding. Urinary frequency.  She has had some burning with urination. Pelvic pressure.  Low back ache like she does with UTI. Denies any recent.   Deneis any heavy lifting.   Patient  denies any fever, body aches,chills, rash, chest pain, shortness of breath, nausea, vomiting, or diarrhea.  Denies dizziness, lightheadedness, pre syncopal or syncopal episodes.     Medications: Outpatient Medications Prior to Visit  Medication Sig  . ADVAIR DISKUS 100-50 MCG/DOSE AEPB INHALE 1 PUFF INTO THE LUNGS TWICE DAILY  . folic acid (FOLVITE) 1 MG tablet Take 1 tablet (1 mg total) by mouth daily.  . methotrexate (RHEUMATREX) 2.5 MG tablet Take 8 tablets (20 mg total) by mouth once a week. Caution:Chemotherapy. Protect from light. (Patient taking differently: Take 10 mg by mouth  once a week. Caution:Chemotherapy. Protect from light.)  . montelukast (SINGULAIR) 10 MG tablet TAKE 1 TABLET BY MOUTH EVERY DAY  . pantoprazole (PROTONIX) 40 MG tablet Take 1 tablet (40 mg total) by mouth daily.  . sertraline (ZOLOFT) 100 MG tablet TAKE 2 TABLET BY MOUTH DAILY. OFFICE VISIT NEEDED  . Vitamin D, Ergocalciferol, (DRISDOL) 1.25 MG (50000 UNIT) CAPS capsule Take 1 capsule (50,000 Units total) by mouth every 7 (seven) days. (taking one tablet per week) walk in lab in office 1-2 weeks after completing prescription.  Marland Kitchen albuterol (VENTOLIN HFA) 108 (90 Base) MCG/ACT inhaler Inhale 2 puffs into the lungs every 6 (six) hours as needed for wheezing or shortness of breath (cough, shortness of breath or wheezing.). (Patient not taking: Reported on 09/13/2020)  . [DISCONTINUED] benzonatate (TESSALON) 100 MG capsule Take 1-2 capsules (100-200 mg total) by mouth 3 (three) times daily as needed for cough.  . [DISCONTINUED] buPROPion (WELLBUTRIN XL) 150 MG 24 hr tablet Take 1 tablet (150 mg total) by mouth daily.  . [DISCONTINUED] cefdinir (OMNICEF) 300 MG capsule Take 2 capsules (600 mg total) by mouth daily.  . [DISCONTINUED] phentermine 15 MG capsule Take 1-2 capsules (15-30 mg total) by mouth every morning.  . [DISCONTINUED] predniSONE (DELTASONE) 20 MG tablet Take 3 PO QAM x 1 day, 2 PO QAM x 5 days, 1 PO QAM x 5 days   No facility-administered medications prior to visit.    Review of Systems  Constitutional: Negative for chills and fever.  HENT: Negative for sore throat.   Gastrointestinal: Positive for abdominal pain, diarrhea and nausea. Negative for  anorexia, constipation and vomiting.  Genitourinary: Positive for dysuria, frequency and pelvic pain. Negative for flank pain, hematuria and urgency.  Musculoskeletal: Positive for back pain. Negative for joint pain.  Skin: Negative for rash.  Neurological: Negative for headaches.       Objective    BP 106/70   Pulse 88   Temp 98.1  F (36.7 C) (Oral)   Resp 15   SpO2 100%     Physical Exam Vitals reviewed. Exam conducted with a chaperone present.  Constitutional:      General: She is not in acute distress.    Appearance: Normal appearance. She is not ill-appearing, toxic-appearing or diaphoretic.  HENT:     Head: Normocephalic and atraumatic.     Right Ear: External ear normal.     Left Ear: External ear normal.     Nose: Nose normal. No congestion or rhinorrhea.     Mouth/Throat:     Pharynx: Oropharynx is clear. No oropharyngeal exudate or posterior oropharyngeal erythema.  Eyes:     Extraocular Movements: Extraocular movements intact.     Conjunctiva/sclera: Conjunctivae normal.  Neck:     Vascular: No carotid bruit.  Cardiovascular:     Rate and Rhythm: Normal rate and regular rhythm.     Pulses: Normal pulses.     Heart sounds: Normal heart sounds.  Pulmonary:     Effort: Pulmonary effort is normal.     Breath sounds: Normal breath sounds.  Abdominal:     General: There is no distension.     Palpations: Abdomen is soft.     Tenderness: There is no abdominal tenderness. There is no right CVA tenderness or left CVA tenderness.     Hernia: A hernia is present. There is no hernia in the left inguinal area or right inguinal area.  Genitourinary:    Exam position: Lithotomy position.     Pubic Area: No rash or pubic lice.      Tanner stage (genital): 5.     Cervix: Dilated. No discharge, lesion, erythema or cervical bleeding.     Uterus: Absent.      Adnexa:        Left: Tenderness present.      Comments: Cervix very low in vaginal canal. Musculoskeletal:        General: Normal range of motion.     Cervical back: Normal range of motion. No rigidity or tenderness.  Lymphadenopathy:     Cervical: No cervical adenopathy.     Lower Body: No right inguinal adenopathy. No left inguinal adenopathy.  Skin:    General: Skin is dry.     Findings: No erythema or rash.  Neurological:     Mental Status:  She is oriented to person, place, and time.  Psychiatric:        Mood and Affect: Mood normal.        Behavior: Behavior normal.        Thought Content: Thought content normal.        Judgment: Judgment normal.     Results for orders placed or performed during the hospital encounter of 09/13/20  I-STAT creatinine  Result Value Ref Range   Creatinine, Ser 0.50 0.44 - 1.00 mg/dL  Results for orders placed or performed in visit on 09/13/20  POCT urinalysis dipstick  Result Value Ref Range   Color, UA yellow    Clarity, UA clear    Glucose, UA Negative Negative   Bilirubin, UA negative    Ketones,  UA negative    Spec Grav, UA <=1.005 (A) 1.010 - 1.025   Blood, UA negat5ive    pH, UA 5.0 5.0 - 8.0   Protein, UA Negative Negative   Urobilinogen, UA 0.2 0.2 or 1.0 E.U./dL   Nitrite, UA negative    Leukocytes, UA Negative Negative   Appearance     Odor      Assessment & Plan     1. Dysuria - POCT urinalysis dipstick  2. Cervix prolapsed into vagina - CT ABDOMEN PELVIS W CONTRAST - Ambulatory referral to Obstetrics / Gynecology  3. Female bladder prolapse- likely can not rule out without scan.   - CT ABDOMEN PELVIS W CONTRAST - Ambulatory referral to Obstetrics / Gynecology  4. Cyst of left ovary Seen on CT done after visit.    Return in about 1 week (around 09/20/2020), or if symptoms worsen or fail to improve, for at any time for any worsening symptoms, Go to Emergency room/ urgent care if worse.     Red Flags discussed. The patient was given clear instructions to go to ER or return to medical center if any red flags develop, symptoms do not improve, worsen or new problems develop. They verbalized understanding.  The entirety of the information documented in the History of Present Illness, Review of Systems and Physical Exam were personally obtained by me. Portions of this information were initially documented by the CMA and reviewed by me for thoroughness and accuracy.       Marcille Buffy, [place] [phone] (phone) [phone] (fax)  [place]

## 2020-09-13 NOTE — Progress Notes (Signed)
There is a 4.5 cm cyst like mass on the left ovary, pelvic ultrasound advised. Pelvic relaxation is noted. Large hiatal hernia also seen incidently.   Ultrasound has been ordered.  She should hear from gynecology within 1 week and also to go for Gynecology ultrasound.  I did refer to gynecology as well. May need GI in future for hiatal hernia.

## 2020-09-13 NOTE — Patient Instructions (Signed)
Pelvic Organ Prolapse Pelvic organ prolapse is a condition in women that involves the stretching, bulging, or dropping of pelvic organs into an abnormal position, past the opening of the vagina. It happens when the muscles and tissues that surround and support pelvic structures become weak or stretched. Pelvic organ prolapse can involve the:  Vagina (vaginal prolapse).  Uterus (uterine prolapse).  Bladder (cystocele).  Rectum (rectocele).  Intestines (enterocele). When organs other than the vagina are involved, they often bulge into the vagina or protrude from the vagina, depending on how severe the prolapse is. What are the causes? This condition may be caused by:  Pregnancy, labor, and childbirth.  Past pelvic surgery.  Lower levels of the hormone estrogen due to menopause.  Consistently lifting more than 50 lb (23 kg).  Obesity.  Long-term difficulty passing stool (chronic constipation).  Long-term, or chronic, cough.  Fluid buildup in the abdomen due to certain conditions. What are the signs or symptoms? Symptoms of this condition include:  Leaking a little urine (loss of bladder control) when you cough, sneeze, strain, and exercise (stress incontinence). This may be worse immediately after childbirth. It may gradually improve over time.  Feeling pressure in your pelvis or vagina. This pressure may increase when you cough or when you are passing stool.  A bulge that protrudes from the opening of your vagina.  Difficulty passing urine or stool.  Pain in your lower back.  Pain or discomfort during sex, or decreased interest in sex.  Repeated bladder infections (urinary tract infections).  Difficulty inserting a tampon. In some people, this condition causes no symptoms. How is this diagnosed? This condition may be diagnosed based on a vaginal and rectal exam. During the exam, you may be asked to cough and strain while you are lying down, sitting, and standing up.  Your health care provider will determine if other tests are required, such as bladder function tests. How is this treated? Treatment for this condition may depend on your symptoms. Treatment may include:  Lifestyle changes, such as drinking plenty of fluids and eating foods that are high in fiber.  Emptying your bladder at scheduled times (bladder training therapy). This can help reduce or avoid urinary incontinence.  Estrogen. This may help mild prolapse by increasing the strength and tone of pelvic floor muscles.  Kegel exercises. These may help mild cases of prolapse by strengthening and tightening the muscles of the pelvic floor.  A soft, flexible device that helps support the vaginal walls and keep pelvic organs in place (pessary). This is inserted into your vagina by your health care provider.  Surgery. This is often the only form of treatment for severe prolapse. Follow these instructions at home: Eating and drinking  Avoid drinking beverages that contain caffeine or alcohol.  Increase your intake of high-fiber foods to decrease constipation and straining during bowel movements. Activity  Lose weight if recommended by your health care provider.  Avoid heavy lifting and straining with exercise and work. Do not hold your breath when you perform mild to moderate lifting and exercise activities. Limit your activities as directed by your health care provider.  Do Kegel exercises as directed by your health care provider. To do this: ? Squeeze your pelvic floor muscles tight. You should feel a tight lift in your rectal area and a tightness in your vaginal area. Keep your stomach, buttocks, and legs relaxed. ? Hold the muscles tight for up to 10 seconds. Then relax your muscles. ? Repeat this exercise  50 times a day, or as much as told by your health care provider. Continue to do this exercise for at least 4-6 weeks, or for as long as told by your health care provider. General  instructions  Take over-the-counter and prescription medicines only as told by your health care provider.  Wear a sanitary pad or adult diapers if you have urinary incontinence.  If you have a pessary, take care of it as told by your health care provider.  Keep all follow-up visits. This is important. Contact a health care provider if you:  Have symptoms that interfere with your daily activities or sex life.  Need medicine to help with the discomfort.  Notice bleeding from your vagina that is not related to your menstrual period.  Have a fever.  Have pain or bleeding when you urinate.  Have bleeding when you pass stool.  Pass urine when you have sex.  Have chronic constipation.  Have a pessary that falls out.  Have a foul-smelling vaginal discharge.  Have an unusual, low pain in your abdomen. Get help right away if you:  Cannot pass urine. Summary  Pelvic organ prolapse is the stretching, bulging, or dropping of pelvic organs into an abnormal position. It happens when the muscles and tissues that surround and support pelvic structures become weak or stretched.  When organs other than the vagina are involved, they often bulge into the vagina or protrude from it, depending on how severe the prolapse is.  In most cases, this condition needs to be treated only if it produces symptoms. Treatment may include lifestyle changes, estrogen, Kegel exercises, pessary insertion, or surgery.  Avoid heavy lifting and straining with exercise and work. Do not hold your breath when you perform mild to moderate lifting and exercise activities. Limit your activities as directed by your health care provider. This information is not intended to replace advice given to you by your health care provider. Make sure you discuss any questions you have with your health care provider. Document Revised: 12/18/2019 Document Reviewed: 12/18/2019 Elsevier Patient Education  [place].

## 2020-09-14 DIAGNOSIS — N83202 Unspecified ovarian cyst, left side: Secondary | ICD-10-CM | POA: Insufficient documentation

## 2020-09-16 ENCOUNTER — Encounter: Payer: Self-pay | Admitting: Adult Health

## 2020-09-16 ENCOUNTER — Other Ambulatory Visit: Payer: Self-pay

## 2020-09-16 ENCOUNTER — Ambulatory Visit
Admission: RE | Admit: 2020-09-16 | Discharge: 2020-09-16 | Disposition: A | Discharge status: Home / Self Care | Payer: 59 | Source: Ambulatory Visit | Attending: Adult Health | Admitting: Adult Health

## 2020-09-16 DIAGNOSIS — N8302 Follicular cyst of left ovary: Secondary | ICD-10-CM | POA: Diagnosis not present

## 2020-09-16 DIAGNOSIS — N83202 Unspecified ovarian cyst, left side: Secondary | ICD-10-CM | POA: Insufficient documentation

## 2020-09-16 DIAGNOSIS — R9389 Abnormal findings on diagnostic imaging of other specified body structures: Secondary | ICD-10-CM | POA: Insufficient documentation

## 2020-09-16 NOTE — Progress Notes (Signed)
[name] Beam, FNP  09/16/2020 11:48 AM EDT Back to Top   Radiology recommends repeat ultrasound of ovary in 3 months.Ultrasound results : IMPRESSION: 4.4 cm cyst in the left ovary demonstrates 1 thin septation( meaning usually benign if less than 3 septation)  without suspicious features I recommend patient be seen by gynecology of choice please place referral.

## 2020-09-16 NOTE — Progress Notes (Signed)
Radiology recommends repeat ultrasound of ovary in 3 months.Ultrasound results : IMPRESSION: 4.4 cm cyst in the left ovary demonstrates 1 thin septation without suspicious features I recommend patient be seen by gynecology of choice please place referral.

## 2020-09-17 ENCOUNTER — Telehealth: Payer: Self-pay | Admitting: Adult Health

## 2020-09-17 ENCOUNTER — Other Ambulatory Visit: Payer: Self-pay | Admitting: Adult Health

## 2020-09-17 DIAGNOSIS — K449 Diaphragmatic hernia without obstruction or gangrene: Secondary | ICD-10-CM

## 2020-09-17 DIAGNOSIS — N83209 Unspecified ovarian cyst, unspecified side: Secondary | ICD-10-CM

## 2020-09-17 NOTE — Telephone Encounter (Signed)
Office is calling on behalf of patient to request a referral to GI.  Patient stated that the doctor had sent in the referral but they do not have it.  Patient would like to see Dr. Watt Climes.  Please advise and call patient to confirm.  [facility] office said the referral can be faxed to [phone] as well.  Please call if there are any other questions at [phone]

## 2020-09-17 NOTE — Telephone Encounter (Signed)
See my chart message, order for referral was put in at 4pm today, patient has been notified already through [place]. KW

## 2020-09-19 ENCOUNTER — Other Ambulatory Visit: Payer: Self-pay | Admitting: Physician Assistant

## 2020-09-19 DIAGNOSIS — K8020 Calculus of gallbladder without cholecystitis without obstruction: Secondary | ICD-10-CM | POA: Diagnosis not present

## 2020-09-19 DIAGNOSIS — R1310 Dysphagia, unspecified: Secondary | ICD-10-CM | POA: Diagnosis not present

## 2020-09-19 DIAGNOSIS — K2100 Gastro-esophageal reflux disease with esophagitis, without bleeding: Secondary | ICD-10-CM | POA: Diagnosis not present

## 2020-09-19 DIAGNOSIS — K5730 Diverticulosis of large intestine without perforation or abscess without bleeding: Secondary | ICD-10-CM | POA: Diagnosis not present

## 2020-09-19 DIAGNOSIS — R197 Diarrhea, unspecified: Secondary | ICD-10-CM | POA: Diagnosis not present

## 2020-09-19 DIAGNOSIS — Z1211 Encounter for screening for malignant neoplasm of colon: Secondary | ICD-10-CM | POA: Diagnosis not present

## 2020-09-23 ENCOUNTER — Ambulatory Visit
Admission: RE | Admit: 2020-09-23 | Discharge: 2020-09-23 | Disposition: A | Discharge status: Home / Self Care | Payer: 59 | Source: Ambulatory Visit | Attending: Physician Assistant | Admitting: Physician Assistant

## 2020-09-23 ENCOUNTER — Other Ambulatory Visit: Payer: Self-pay

## 2020-09-23 DIAGNOSIS — N83292 Other ovarian cyst, left side: Secondary | ICD-10-CM | POA: Diagnosis not present

## 2020-09-23 DIAGNOSIS — K219 Gastro-esophageal reflux disease without esophagitis: Secondary | ICD-10-CM | POA: Diagnosis not present

## 2020-09-23 DIAGNOSIS — K225 Diverticulum of esophagus, acquired: Secondary | ICD-10-CM | POA: Diagnosis not present

## 2020-09-23 DIAGNOSIS — N8111 Cystocele, midline: Secondary | ICD-10-CM | POA: Diagnosis not present

## 2020-09-23 DIAGNOSIS — R1310 Dysphagia, unspecified: Secondary | ICD-10-CM | POA: Diagnosis not present

## 2020-09-23 DIAGNOSIS — K224 Dyskinesia of esophagus: Secondary | ICD-10-CM | POA: Diagnosis not present

## 2020-09-24 ENCOUNTER — Telehealth: Payer: Self-pay | Admitting: Adult Health

## 2020-09-27 ENCOUNTER — Ambulatory Visit: Admission: RE | Admit: 2020-09-27 | Payer: 59 | Source: Ambulatory Visit

## 2020-10-14 DIAGNOSIS — D123 Benign neoplasm of transverse colon: Secondary | ICD-10-CM | POA: Diagnosis not present

## 2020-10-14 DIAGNOSIS — R1310 Dysphagia, unspecified: Secondary | ICD-10-CM | POA: Diagnosis not present

## 2020-10-14 DIAGNOSIS — R197 Diarrhea, unspecified: Secondary | ICD-10-CM | POA: Diagnosis not present

## 2020-10-14 DIAGNOSIS — D125 Benign neoplasm of sigmoid colon: Secondary | ICD-10-CM | POA: Diagnosis not present

## 2020-10-14 DIAGNOSIS — K529 Noninfective gastroenteritis and colitis, unspecified: Secondary | ICD-10-CM | POA: Diagnosis not present

## 2020-10-14 DIAGNOSIS — K449 Diaphragmatic hernia without obstruction or gangrene: Secondary | ICD-10-CM | POA: Diagnosis not present

## 2020-10-14 DIAGNOSIS — D120 Benign neoplasm of cecum: Secondary | ICD-10-CM | POA: Diagnosis not present

## 2020-10-14 LAB — HM COLONOSCOPY

## 2020-10-21 ENCOUNTER — Other Ambulatory Visit: Payer: Self-pay

## 2020-10-21 DIAGNOSIS — K2100 Gastro-esophageal reflux disease with esophagitis, without bleeding: Secondary | ICD-10-CM | POA: Diagnosis not present

## 2020-10-21 DIAGNOSIS — R197 Diarrhea, unspecified: Secondary | ICD-10-CM | POA: Diagnosis not present

## 2020-10-21 DIAGNOSIS — K449 Diaphragmatic hernia without obstruction or gangrene: Secondary | ICD-10-CM | POA: Diagnosis not present

## 2020-10-21 MED ORDER — SUCRALFATE 1 G PO TABS
ORAL_TABLET | ORAL | 1 refills | Status: DC
  Filled 2020-10-21: qty 90, 30d supply, fill #0

## 2020-10-21 MED FILL — Pantoprazole Sodium EC Tab 40 MG (Base Equiv): ORAL | 90 days supply | Qty: 90 | Fill #0 | Status: AC

## 2020-10-21 MED FILL — Sertraline HCl Tab 50 MG: ORAL | 30 days supply | Qty: 90 | Fill #0 | Status: AC

## 2020-10-23 ENCOUNTER — Other Ambulatory Visit: Payer: Self-pay

## 2020-10-23 MED ORDER — PANTOPRAZOLE SODIUM 40 MG PO TBEC
40.0000 mg | DELAYED_RELEASE_TABLET | ORAL | 3 refills | Status: DC
  Filled 2020-10-23 – 2021-03-11 (×2): qty 180, 90d supply, fill #0
  Filled 2021-08-07: qty 180, 90d supply, fill #1

## 2020-10-24 ENCOUNTER — Other Ambulatory Visit: Payer: Self-pay

## 2020-10-24 ENCOUNTER — Encounter: Payer: Self-pay | Admitting: Adult Health

## 2020-10-24 MED ORDER — HYDROXYZINE PAMOATE 25 MG PO CAPS
25.0000 mg | ORAL_CAPSULE | Freq: Three times a day (TID) | ORAL | 1 refills | Status: DC | PRN
  Filled 2020-10-24: qty 90, 30d supply, fill #0
  Filled 2021-01-08: qty 90, 30d supply, fill #1

## 2020-10-24 NOTE — Telephone Encounter (Signed)
MyChart message requesting a prescription for hydrozyzine. Pharmacy dispense history confirms she was prescribed 25mg  TID prn by her previous PCP [facility].

## 2020-10-29 ENCOUNTER — Other Ambulatory Visit: Payer: Self-pay

## 2020-11-18 DIAGNOSIS — N83202 Unspecified ovarian cyst, left side: Secondary | ICD-10-CM | POA: Diagnosis not present

## 2020-11-19 ENCOUNTER — Ambulatory Visit: Payer: Self-pay | Admitting: Adult Health

## 2020-11-26 ENCOUNTER — Other Ambulatory Visit: Payer: Self-pay

## 2020-11-26 MED ORDER — MONTELUKAST SODIUM 10 MG PO TABS
10.0000 mg | ORAL_TABLET | ORAL | 0 refills | Status: DC
  Filled 2020-11-26: qty 90, 90d supply, fill #0

## 2020-11-26 MED FILL — Sertraline HCl Tab 50 MG: ORAL | 90 days supply | Qty: 90 | Fill #1 | Status: CN

## 2020-11-26 MED FILL — Sertraline HCl Tab 50 MG: ORAL | 30 days supply | Qty: 90 | Fill #1 | Status: AC

## 2020-11-26 MED FILL — Sertraline HCl Tab 50 MG: ORAL | 30 days supply | Qty: 90 | Fill #1 | Status: CN

## 2020-11-26 MED FILL — Methotrexate Sodium Tab 2.5 MG (Base Equiv): ORAL | 84 days supply | Qty: 96 | Fill #0 | Status: AC

## 2020-11-27 ENCOUNTER — Other Ambulatory Visit: Payer: Self-pay

## 2020-12-16 ENCOUNTER — Telehealth: Payer: Self-pay

## 2020-12-16 NOTE — Telephone Encounter (Signed)
Not sure what is, or will be, available in early July. Looks like she is scheduled by her GYN for some bladder surgery on 01-17-21.

## 2020-12-16 NOTE — Telephone Encounter (Signed)
Copied from [facility] [id number]. Topic: Appointment Scheduling - Scheduling Inquiry for Clinic >> Dec 16, 2020  8:25 AM [name] wrote: Reason for CRM: patient is trying to get earlier appt, that she needs in July, by July 15, because she has to have it done by then to receive her discount with her insurance, and already this scheduled with Dr Claudina Lick previously. Please call back.

## 2020-12-18 NOTE — Telephone Encounter (Signed)
Sent patient a my chart message telling her that we have no earlier openings for physicals and to check with her OB/Gyn to see if they will do a physical for her.

## 2020-12-19 ENCOUNTER — Encounter: Payer: 59 | Admitting: Adult Health

## 2020-12-25 ENCOUNTER — Encounter: Payer: 59 | Admitting: Adult Health

## 2020-12-30 ENCOUNTER — Encounter: Payer: Self-pay | Admitting: Adult Health

## 2020-12-30 ENCOUNTER — Telehealth: Payer: Self-pay | Admitting: Adult Health

## 2020-12-30 NOTE — Telephone Encounter (Signed)
PT called to advise that this has became a inconvenience and she was needing a physical done before July 1st for insurance reasons. PT will be finding a new provider.

## 2021-01-02 DIAGNOSIS — Z0189 Encounter for other specified special examinations: Secondary | ICD-10-CM | POA: Diagnosis not present

## 2021-01-07 ENCOUNTER — Encounter: Payer: 59 | Admitting: Adult Health

## 2021-01-08 ENCOUNTER — Other Ambulatory Visit: Payer: Self-pay

## 2021-01-08 ENCOUNTER — Other Ambulatory Visit: Payer: Self-pay | Admitting: Adult Health

## 2021-01-08 MED FILL — Folic Acid Tab 1 MG: ORAL | 90 days supply | Qty: 90 | Fill #0 | Status: AC

## 2021-01-10 ENCOUNTER — Encounter (HOSPITAL_BASED_OUTPATIENT_CLINIC_OR_DEPARTMENT_OTHER): Payer: Self-pay | Admitting: Obstetrics and Gynecology

## 2021-01-10 ENCOUNTER — Other Ambulatory Visit: Payer: Self-pay

## 2021-01-10 ENCOUNTER — Encounter (HOSPITAL_COMMUNITY): Admission: RE | Admit: 2021-01-10 | Payer: 59 | Source: Ambulatory Visit

## 2021-01-10 ENCOUNTER — Encounter (HOSPITAL_COMMUNITY): Payer: Self-pay

## 2021-01-10 ENCOUNTER — Other Ambulatory Visit
Admission: RE | Admit: 2021-01-10 | Discharge: 2021-01-10 | Disposition: A | Discharge status: Home / Self Care | Payer: 59 | Source: Ambulatory Visit | Attending: Obstetrics and Gynecology | Admitting: Obstetrics and Gynecology

## 2021-01-10 DIAGNOSIS — Z20822 Contact with and (suspected) exposure to covid-19: Secondary | ICD-10-CM | POA: Diagnosis not present

## 2021-01-10 DIAGNOSIS — Z01812 Encounter for preprocedural laboratory examination: Secondary | ICD-10-CM | POA: Insufficient documentation

## 2021-01-10 LAB — SARS CORONAVIRUS 2 (TAT 6-24 HRS): SARS Coronavirus 2: NEGATIVE

## 2021-01-10 MED ORDER — SERTRALINE HCL 50 MG PO TABS
ORAL_TABLET | ORAL | 0 refills | Status: DC
  Filled 2021-01-10: qty 90, 30d supply, fill #0

## 2021-01-10 MED ORDER — SERTRALINE HCL 50 MG PO TABS
ORAL_TABLET | ORAL | 3 refills | Status: DC
  Filled 2021-01-10: qty 270, 90d supply, fill #0

## 2021-01-10 NOTE — Progress Notes (Addendum)
Spoke w/ via phone for pre-op interview--- Pt Lab needs dos---- CBC              Lab results------ no COVID test -----patient states asymptomatic no test needed Arrive at ------- 0730 on 01-14-2021 NPO after MN NO Solid Food.  Clear liquids from MN until--- 0630 Med rec completed Medications to take morning of surgery ----- Singulair, Zoloft, Protonix, Advair inhaler;  do not take methotrexate morning of surgery Diabetic medication ----- n/a Patient instructed no nail polish to be worn day of surgery Patient instructed to bring photo id and insurance card day of surgery Patient aware to have Driver (ride ) / caregiver for 24 hours after surgery --sig other, [name] Patient Special Instructions ----- reviewed [facility] and visitor guidelines.  Asked to bring both inhaler's dos Pre-Op special Istructions ----- n/a Patient verbalized understanding of instructions that were given at this phone interview. Patient denies shortness of breath, chest pain, fever, cough at this phone interview.

## 2021-01-13 ENCOUNTER — Other Ambulatory Visit: Payer: Self-pay

## 2021-01-13 NOTE — H&P (Signed)
[name] is an 50 y.o. female. Had [facility] in 2011. Has been feeling something bulge, burns a bit, can be more difficult to empty bladder and have some urgency and frequency. No constipation or diarrhea. had CT scan from PCP for these sx, had 4 cm left ovarian cyst, large hiatal hernia. Pelvic u/s with 4 cm left ovarian cyst with one septation.  Repeat pelvic u/s on 5-16 with small, simple bilateral ovarian cysts up to 1.3 cm.  Options discussed, she wants surgical repair of cystocele.  Pertinent Gynecological History: Last mammogram: normal  Last pap: normal Date: 05/2015 OB History: G4, P0202 SVD x 2   Menstrual History:  No LMP recorded. Patient has had a hysterectomy.    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[place])    [facility]---  dx 2015,  takes methotrexate wkly, pt stated she is stable    Past Surgical History:  Procedure Laterality Date   BREAST ENHANCEMENT SURGERY  2009   CARPAL TUNNEL RELEASE Right 2011   LAPAROSCOPIC SUPRACERVICAL HYSTERECTOMY  02/21/2010   @ [facility] by Dr Willis Modena   NASAL SEPTUM SURGERY  01/2018   insertion button prosthesis for septal perforation   RHINOPLASTY  2005   nasal fracture    Family History  Problem Relation Age of Onset   Asthma Mother    Depression Mother    Diabetes Mother    Hyperlipidemia Mother    Hypertension Mother    Mental illness Mother    Arthritis Mother    Heart disease Father 73       AMI/CABG   Mental illness Sister    Myasthenia gravis  Daughter    Charcot-Marie-Tooth disease Daughter    ADD / ADHD Daughter    Pancreatic cancer Maternal Aunt    Alzheimer's disease Maternal Grandmother    Hyperlipidemia Maternal Grandmother    Hypertension Maternal Grandmother    Heart attack Maternal Grandfather    Lung cancer Maternal Grandfather     Social History:  reports that she has never smoked. She has never used smokeless tobacco. She reports current alcohol use. She reports that she does not use drugs.  Allergies:  Allergies  Allergen Reactions   Kiwi Extract Shortness Of Breath and Swelling   Ativan [Lorazepam] Other (See Comments)    Hyperactivity per patient   Erythromycin Base Nausea And Vomiting   Nitrofurantoin Nausea And Vomiting    No medications prior to admission.    Review of Systems  Respiratory: Negative.    Cardiovascular: Negative.    Height 5\' 3"  (1.6 m), weight 74.8 kg. Physical Exam Cardiovascular:     Rate and Rhythm: Normal rate and regular rhythm.     Heart sounds: Normal heart sounds. No murmur heard. Pulmonary:     Effort: Pulmonary effort is normal. No respiratory distress.     Breath sounds: Normal breath sounds.  Abdominal:     General: There is no distension.     Palpations:  Abdomen is soft. There is no mass.     Tenderness: There is no abdominal tenderness.  Genitourinary:    Comments: Gr II cystocele cervix normal, well supported, uterus surgically absent No adnexal mass Musculoskeletal:     Cervical back: Normal range of motion and neck supple.  Neurological:     Mental Status: She is alert.    No results found for this or any previous visit (from the past 24 hour(s)).  No results found.  Assessment/Plan: Symptomatic cystocele, previous LSH.  Discussed medical and surgical options, she wants surgical repair.  Discussed surgical procedure, risks, possibility of recurrence/failure, questions answered.  Will admit for anterior, possible posterior repair  Blane Ohara  Haidee Stogsdill 01/13/2021, 5:00 PM

## 2021-01-14 ENCOUNTER — Ambulatory Visit (HOSPITAL_BASED_OUTPATIENT_CLINIC_OR_DEPARTMENT_OTHER): Payer: 59 | Admitting: Anesthesiology

## 2021-01-14 ENCOUNTER — Other Ambulatory Visit: Payer: Self-pay

## 2021-01-14 ENCOUNTER — Encounter (HOSPITAL_BASED_OUTPATIENT_CLINIC_OR_DEPARTMENT_OTHER): Payer: Self-pay | Admitting: Obstetrics and Gynecology

## 2021-01-14 ENCOUNTER — Encounter (HOSPITAL_BASED_OUTPATIENT_CLINIC_OR_DEPARTMENT_OTHER)
Admission: RE | Disposition: A | Discharge status: Home / Self Care | Payer: Self-pay | Source: Home / Self Care | Attending: Obstetrics and Gynecology

## 2021-01-14 ENCOUNTER — Ambulatory Visit (HOSPITAL_BASED_OUTPATIENT_CLINIC_OR_DEPARTMENT_OTHER)
Admission: RE | Admit: 2021-01-14 | Discharge: 2021-01-15 | Disposition: A | Discharge status: Home / Self Care | Payer: 59 | Attending: Obstetrics and Gynecology | Admitting: Obstetrics and Gynecology

## 2021-01-14 DIAGNOSIS — N814 Uterovaginal prolapse, unspecified: Secondary | ICD-10-CM | POA: Diagnosis present

## 2021-01-14 DIAGNOSIS — Z7951 Long term (current) use of inhaled steroids: Secondary | ICD-10-CM | POA: Insufficient documentation

## 2021-01-14 DIAGNOSIS — Z90710 Acquired absence of both cervix and uterus: Secondary | ICD-10-CM | POA: Diagnosis not present

## 2021-01-14 DIAGNOSIS — Z8616 Personal history of COVID-19: Secondary | ICD-10-CM | POA: Diagnosis not present

## 2021-01-14 DIAGNOSIS — Z79899 Other long term (current) drug therapy: Secondary | ICD-10-CM | POA: Diagnosis not present

## 2021-01-14 DIAGNOSIS — Z881 Allergy status to other antibiotic agents status: Secondary | ICD-10-CM | POA: Insufficient documentation

## 2021-01-14 DIAGNOSIS — N816 Rectocele: Secondary | ICD-10-CM | POA: Diagnosis not present

## 2021-01-14 DIAGNOSIS — F411 Generalized anxiety disorder: Secondary | ICD-10-CM | POA: Diagnosis not present

## 2021-01-14 DIAGNOSIS — J449 Chronic obstructive pulmonary disease, unspecified: Secondary | ICD-10-CM | POA: Diagnosis not present

## 2021-01-14 DIAGNOSIS — Z888 Allergy status to other drugs, medicaments and biological substances status: Secondary | ICD-10-CM | POA: Diagnosis not present

## 2021-01-14 DIAGNOSIS — N8110 Cystocele, unspecified: Secondary | ICD-10-CM | POA: Diagnosis not present

## 2021-01-14 HISTORY — PX: ANTERIOR AND POSTERIOR REPAIR: SHX5121

## 2021-01-14 HISTORY — DX: 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: M31.30

## 2021-01-14 HISTORY — DX: Presence of spectacles and contact lenses: Z97.3

## 2021-01-14 HISTORY — DX: Mixed incontinence: N39.46

## 2021-01-14 HISTORY — DX: Major depressive disorder, single episode, unspecified: F32.9

## 2021-01-14 HISTORY — DX: Generalized anxiety disorder: F41.1

## 2021-01-14 HISTORY — DX: Diaphragmatic hernia without obstruction or gangrene: K44.9

## 2021-01-14 HISTORY — DX: Cystocele, unspecified: N81.10

## 2021-01-14 LAB — CBC
HCT: 37.7 % (ref 36.0–46.0)
Hemoglobin: 12.3 g/dL (ref 12.0–15.0)
MCH: 30.4 pg (ref 26.0–34.0)
MCHC: 32.6 g/dL (ref 30.0–36.0)
MCV: 93.3 fL (ref 80.0–100.0)
Platelets: 256 10*3/uL (ref 150–400)
RBC: 4.04 MIL/uL (ref 3.87–5.11)
RDW: 15 % (ref 11.5–15.5)
WBC: 5.5 10*3/uL (ref 4.0–10.5)
nRBC: 0 % (ref 0.0–0.2)

## 2021-01-14 SURGERY — ANTERIOR (CYSTOCELE) AND POSTERIOR REPAIR (RECTOCELE)
Anesthesia: General | Site: Vagina

## 2021-01-14 MED ORDER — FENTANYL CITRATE (PF) 100 MCG/2ML IJ SOLN: 25.0000 ug | INTRAMUSCULAR | Status: DC | PRN

## 2021-01-14 MED ORDER — MONTELUKAST SODIUM 10 MG PO TABS: 10.0000 mg | ORAL_TABLET | Freq: Every day | ORAL | Status: DC

## 2021-01-14 MED ORDER — KETOROLAC TROMETHAMINE 30 MG/ML IJ SOLN
30.0000 mg | Freq: Four times a day (QID) | INTRAMUSCULAR | Status: AC
  Administered 2021-01-14 – 2021-01-15 (×4): 30 mg via INTRAVENOUS

## 2021-01-14 MED ORDER — BISACODYL 10 MG RE SUPP: 10.0000 mg | Freq: Every day | RECTAL | Status: DC | PRN

## 2021-01-14 MED ORDER — SENNA 8.6 MG PO TABS
1.0000 | ORAL_TABLET | Freq: Two times a day (BID) | ORAL | Status: DC
  Administered 2021-01-14 (×2): 8.6 mg via ORAL

## 2021-01-14 MED ORDER — CEFAZOLIN SODIUM-DEXTROSE 2-4 GM/100ML-% IV SOLN
INTRAVENOUS | Status: AC
  Filled 2021-01-14: qty 100

## 2021-01-14 MED ORDER — PANTOPRAZOLE SODIUM 40 MG PO TBEC
40.0000 mg | DELAYED_RELEASE_TABLET | Freq: Two times a day (BID) | ORAL | Status: DC
  Administered 2021-01-14: 40 mg via ORAL

## 2021-01-14 MED ORDER — ONDANSETRON HCL 4 MG/2ML IJ SOLN: 4.0000 mg | Freq: Four times a day (QID) | INTRAMUSCULAR | Status: DC | PRN

## 2021-01-14 MED ORDER — LACTATED RINGERS IV SOLN: INTRAVENOUS | Status: DC

## 2021-01-14 MED ORDER — DEXAMETHASONE SODIUM PHOSPHATE 10 MG/ML IJ SOLN
INTRAMUSCULAR | Status: AC
  Filled 2021-01-14: qty 1

## 2021-01-14 MED ORDER — GABAPENTIN 300 MG PO CAPS
ORAL_CAPSULE | ORAL | Status: AC
  Filled 2021-01-14: qty 1

## 2021-01-14 MED ORDER — OXYCODONE HCL 5 MG PO TABS
ORAL_TABLET | ORAL | Status: AC
  Filled 2021-01-14: qty 1

## 2021-01-14 MED ORDER — PANTOPRAZOLE SODIUM 40 MG PO TBEC
DELAYED_RELEASE_TABLET | ORAL | Status: AC
  Filled 2021-01-14: qty 1

## 2021-01-14 MED ORDER — LIDOCAINE HCL (PF) 2 % IJ SOLN
INTRAMUSCULAR | Status: AC
  Filled 2021-01-14: qty 5

## 2021-01-14 MED ORDER — MOMETASONE FURO-FORMOTEROL FUM 100-5 MCG/ACT IN AERO: 2.0000 | INHALATION_SPRAY | Freq: Two times a day (BID) | RESPIRATORY_TRACT | Status: DC

## 2021-01-14 MED ORDER — DEXAMETHASONE SODIUM PHOSPHATE 10 MG/ML IJ SOLN
INTRAMUSCULAR | Status: DC | PRN
  Administered 2021-01-14: 5 mg via INTRAVENOUS

## 2021-01-14 MED ORDER — ONDANSETRON HCL 4 MG PO TABS: 4.0000 mg | ORAL_TABLET | Freq: Four times a day (QID) | ORAL | Status: DC | PRN

## 2021-01-14 MED ORDER — SENNA 8.6 MG PO TABS
ORAL_TABLET | ORAL | Status: AC
  Filled 2021-01-14: qty 1

## 2021-01-14 MED ORDER — SERTRALINE HCL 50 MG PO TABS: 150.0000 mg | ORAL_TABLET | Freq: Every day | ORAL | Status: DC

## 2021-01-14 MED ORDER — HYDROMORPHONE HCL 1 MG/ML IJ SOLN: 1.0000 mg | INTRAMUSCULAR | Status: DC | PRN

## 2021-01-14 MED ORDER — 0.9 % SODIUM CHLORIDE (POUR BTL) OPTIME
TOPICAL | Status: DC | PRN
  Administered 2021-01-14: 500 mL

## 2021-01-14 MED ORDER — SIMETHICONE 80 MG PO CHEW: 80.0000 mg | CHEWABLE_TABLET | Freq: Four times a day (QID) | ORAL | Status: DC | PRN

## 2021-01-14 MED ORDER — ONDANSETRON HCL 4 MG/2ML IJ SOLN
INTRAMUSCULAR | Status: DC | PRN
  Administered 2021-01-14: 4 mg via INTRAVENOUS

## 2021-01-14 MED ORDER — SCOPOLAMINE 1 MG/3DAYS TD PT72
1.0000 | MEDICATED_PATCH | TRANSDERMAL | Status: DC
  Administered 2021-01-14: 1.5 mg via TRANSDERMAL

## 2021-01-14 MED ORDER — PROMETHAZINE HCL 25 MG/ML IJ SOLN: 6.2500 mg | INTRAMUSCULAR | Status: DC | PRN

## 2021-01-14 MED ORDER — ACETAMINOPHEN 500 MG PO TABS
1000.0000 mg | ORAL_TABLET | Freq: Four times a day (QID) | ORAL | Status: DC
  Administered 2021-01-14 – 2021-01-15 (×4): 1000 mg via ORAL

## 2021-01-14 MED ORDER — GABAPENTIN 300 MG PO CAPS
300.0000 mg | ORAL_CAPSULE | ORAL | Status: AC
  Administered 2021-01-14: 300 mg via ORAL

## 2021-01-14 MED ORDER — IBUPROFEN 200 MG PO TABS: 600.0000 mg | ORAL_TABLET | Freq: Four times a day (QID) | ORAL | Status: DC

## 2021-01-14 MED ORDER — ACETAMINOPHEN 500 MG PO TABS
1000.0000 mg | ORAL_TABLET | ORAL | Status: AC
  Administered 2021-01-14: 1000 mg via ORAL

## 2021-01-14 MED ORDER — FENTANYL CITRATE (PF) 250 MCG/5ML IJ SOLN
INTRAMUSCULAR | Status: DC | PRN
  Administered 2021-01-14: 100 ug via INTRAVENOUS
  Administered 2021-01-14 (×3): 50 ug via INTRAVENOUS

## 2021-01-14 MED ORDER — LIDOCAINE 2% (20 MG/ML) 5 ML SYRINGE
INTRAMUSCULAR | Status: DC | PRN
  Administered 2021-01-14: 60 mg via INTRAVENOUS

## 2021-01-14 MED ORDER — ROCURONIUM BROMIDE 10 MG/ML (PF) SYRINGE
PREFILLED_SYRINGE | INTRAVENOUS | Status: AC
  Filled 2021-01-14: qty 10

## 2021-01-14 MED ORDER — MENTHOL 3 MG MT LOZG: 1.0000 | LOZENGE | OROMUCOSAL | Status: DC | PRN

## 2021-01-14 MED ORDER — GABAPENTIN 300 MG PO CAPS
300.0000 mg | ORAL_CAPSULE | Freq: Three times a day (TID) | ORAL | Status: DC
  Administered 2021-01-14 (×2): 300 mg via ORAL

## 2021-01-14 MED ORDER — KETOROLAC TROMETHAMINE 30 MG/ML IJ SOLN
INTRAMUSCULAR | Status: AC
  Filled 2021-01-14: qty 1

## 2021-01-14 MED ORDER — FENTANYL CITRATE (PF) 250 MCG/5ML IJ SOLN
INTRAMUSCULAR | Status: AC
  Filled 2021-01-14: qty 5

## 2021-01-14 MED ORDER — ROCURONIUM BROMIDE 10 MG/ML (PF) SYRINGE
PREFILLED_SYRINGE | INTRAVENOUS | Status: DC | PRN
  Administered 2021-01-14: 20 mg via INTRAVENOUS
  Administered 2021-01-14: 50 mg via INTRAVENOUS

## 2021-01-14 MED ORDER — OXYCODONE HCL 5 MG PO TABS
5.0000 mg | ORAL_TABLET | ORAL | Status: DC | PRN
  Administered 2021-01-14 – 2021-01-15 (×4): 5 mg via ORAL

## 2021-01-14 MED ORDER — ACETAMINOPHEN 500 MG PO TABS
ORAL_TABLET | ORAL | Status: AC
  Filled 2021-01-14: qty 2

## 2021-01-14 MED ORDER — ALBUTEROL SULFATE HFA 108 (90 BASE) MCG/ACT IN AERS: 2.0000 | INHALATION_SPRAY | Freq: Four times a day (QID) | RESPIRATORY_TRACT | Status: DC | PRN

## 2021-01-14 MED ORDER — DEXTROSE-NACL 5-0.45 % IV SOLN
INTRAVENOUS | Status: DC
  Administered 2021-01-14: 1 mL via INTRAVENOUS

## 2021-01-14 MED ORDER — SCOPOLAMINE 1 MG/3DAYS TD PT72
MEDICATED_PATCH | TRANSDERMAL | Status: AC
  Filled 2021-01-14: qty 1

## 2021-01-14 MED ORDER — CEFAZOLIN SODIUM-DEXTROSE 2-4 GM/100ML-% IV SOLN
2.0000 g | INTRAVENOUS | Status: AC
  Administered 2021-01-14: 2 g via INTRAVENOUS
  Filled 2021-01-14: qty 100

## 2021-01-14 MED ORDER — MIDAZOLAM HCL 5 MG/5ML IJ SOLN
INTRAMUSCULAR | Status: DC | PRN
  Administered 2021-01-14: 2 mg via INTRAVENOUS

## 2021-01-14 MED ORDER — ONDANSETRON HCL 4 MG/2ML IJ SOLN
INTRAMUSCULAR | Status: AC
  Filled 2021-01-14: qty 2

## 2021-01-14 MED ORDER — SUGAMMADEX SODIUM 200 MG/2ML IV SOLN
INTRAVENOUS | Status: DC | PRN
  Administered 2021-01-14: 200 mg via INTRAVENOUS

## 2021-01-14 MED ORDER — KETOROLAC TROMETHAMINE 30 MG/ML IJ SOLN
30.0000 mg | Freq: Once | INTRAMUSCULAR | Status: AC
Start: 2021-01-14 — End: 2021-01-14
  Administered 2021-01-14: 30 mg via INTRAVENOUS

## 2021-01-14 MED ORDER — MIDAZOLAM HCL 2 MG/2ML IJ SOLN
INTRAMUSCULAR | Status: AC
  Filled 2021-01-14: qty 2

## 2021-01-14 MED ORDER — PROPOFOL 10 MG/ML IV BOLUS
INTRAVENOUS | Status: AC
  Filled 2021-01-14: qty 20

## 2021-01-14 MED ORDER — PROPOFOL 10 MG/ML IV BOLUS
INTRAVENOUS | Status: DC | PRN
  Administered 2021-01-14: 150 mg via INTRAVENOUS

## 2021-01-14 MED ORDER — ALUM & MAG HYDROXIDE-SIMETH 200-200-20 MG/5ML PO SUSP: 30.0000 mL | ORAL | Status: DC | PRN

## 2021-01-14 SURGICAL SUPPLY — 36 items
BLADE SURG 15 STRL LF DISP TIS (BLADE) IMPLANT
BLADE SURG 15 STRL SS (BLADE)
CATH ROBINSON RED A/P 16FR (CATHETERS) ×2 IMPLANT
CLOTH BEACON ORANGE TIMEOUT ST (SAFETY) ×2 IMPLANT
DECANTER SPIKE VIAL GLASS SM (MISCELLANEOUS) IMPLANT
GAUZE 4X4 16PLY ~~LOC~~+RFID DBL (SPONGE) ×4 IMPLANT
GAUZE PACKING 2X5 YD STRL (GAUZE/BANDAGES/DRESSINGS) ×2 IMPLANT
GLOVE SRG 8 PF TXTR STRL LF DI (GLOVE) ×1 IMPLANT
GLOVE SURG ENC MOIS LTX SZ8 (GLOVE) ×2 IMPLANT
GLOVE SURG ORTHO LTX SZ7.5 (GLOVE) ×2 IMPLANT
GLOVE SURG UNDER POLY LF SZ6.5 (GLOVE) ×2 IMPLANT
GLOVE SURG UNDER POLY LF SZ7 (GLOVE) ×2 IMPLANT
GLOVE SURG UNDER POLY LF SZ8 (GLOVE) ×2
GOWN STRL REUS W/TWL LRG LVL3 (GOWN DISPOSABLE) ×6 IMPLANT
GOWN STRL REUS W/TWL XL LVL3 (GOWN DISPOSABLE) ×2 IMPLANT
HOLDER FOLEY CATH W/STRAP (MISCELLANEOUS) ×2 IMPLANT
KIT TURNOVER CYSTO (KITS) ×2 IMPLANT
LUBRICANT JELLY K Y 4OZ (MISCELLANEOUS) ×2 IMPLANT
NEEDLE HYPO 22GX1.5 SAFETY (NEEDLE) IMPLANT
NEEDLE MAYO CATGUT SZ4 (NEEDLE) IMPLANT
NS IRRIG 1000ML POUR BTL (IV SOLUTION) IMPLANT
PACK VAGINAL WOMENS (CUSTOM PROCEDURE TRAY) ×2 IMPLANT
SPONGE T-LAP 4X18 ~~LOC~~+RFID (SPONGE) ×2 IMPLANT
SUT CHROMIC 3 0 SH 27 (SUTURE) ×2 IMPLANT
SUT PROLENE 1 CTX 30  8455H (SUTURE)
SUT PROLENE 1 CTX 30 8455H (SUTURE) IMPLANT
SUT SILK 0 SH 30 (SUTURE) IMPLANT
SUT VIC AB 2-0 CT2 27 (SUTURE) ×12 IMPLANT
SUT VIC AB 3-0 CT1 27 (SUTURE)
SUT VIC AB 3-0 CT1 TAPERPNT 27 (SUTURE) IMPLANT
SUT VIC AB 3-0 SH 27 (SUTURE) ×2
SUT VIC AB 3-0 SH 27X BRD (SUTURE) ×1 IMPLANT
SUT VICRYL 0 UR6 27IN ABS (SUTURE) IMPLANT
SYR BULB IRRIG 60ML STRL (SYRINGE) ×2 IMPLANT
TOWEL OR 17X26 10 PK STRL BLUE (TOWEL DISPOSABLE) ×2 IMPLANT
TRAY FOLEY W/BAG SLVR 14FR LF (SET/KITS/TRAYS/PACK) ×2 IMPLANT

## 2021-01-14 NOTE — Progress Notes (Signed)
Post-op check, A&P repair Doing well, mild pain, ambulating, no n/v Afeb, VSS Clear urine in foley Abd-NT Continue routine care

## 2021-01-14 NOTE — Interval H&P Note (Signed)
History and Physical Interval Note:  01/14/2021 8:36 AM  [name]  has presented today for surgery, with the diagnosis of symptomatic cystocele.  The various methods of treatment have been discussed with the patient and family. After consideration of risks, benefits and other options for treatment, the patient has consented to  Procedure(s): ANTERIOR (CYSTOCELE) AND POSSIBLE POSTERIOR REPAIR (RECTOCELE) (N/A) as a surgical intervention.  The patient's history has been reviewed, patient examined, no change in status, stable for surgery.  I have reviewed the patient's chart and labs.  Questions were answered to the patient's satisfaction.     Blane Ohara Rykker Coviello

## 2021-01-14 NOTE — Transfer of Care (Signed)
Immediate Anesthesia Transfer of Care Note  Patient: [name]  Procedure(s) Performed: ANTERIOR (CYSTOCELE) AND POSTERIOR REPAIR (RECTOCELE) (Vagina )  Patient Location: PACU  Anesthesia Type:General  Level of Consciousness: awake, drowsy and responds to stimulation  Airway & Oxygen Therapy: Patient Spontanous Breathing and Patient connected to face mask oxygen  Post-op Assessment: Report given to RN and Post -op Vital signs reviewed and stable  Post vital signs: Reviewed and stable  Last Vitals:  Vitals Value Taken Time  BP 126/67 01/14/21 1039  Temp    Pulse 74 01/14/21 1041  Resp 16 01/14/21 1041  SpO2 100 % 01/14/21 1041  Vitals shown include unvalidated device data.  Last Pain:  Vitals:   01/14/21 0741  TempSrc: Oral  PainSc: 0-No pain      Patients Stated Pain Goal: 5 (53/66/44 0347)  Complications: No notable events documented.

## 2021-01-14 NOTE — Anesthesia Preprocedure Evaluation (Signed)
Anesthesia Evaluation  Patient identified by MRN, date of birth, ID band Patient awake    Reviewed: Allergy & Precautions, NPO status , Patient's Chart, lab work & pertinent test results  Airway Mallampati: II  TM Distance: >3 FB Neck ROM: Full    Dental  (+) Teeth Intact   Pulmonary asthma ,  COPD inhaler,    Pulmonary exam normal        Cardiovascular negative cardio ROS   Rhythm:Regular Rate:Normal     Neuro/Psych Anxiety Depression negative neurological ROS     GI/Hepatic Neg liver ROS, hiatal hernia, GERD  Medicated,  Endo/Other  negative endocrine ROS  Renal/GU Wegener's granulomatosis   Rectocele, cystocele    Musculoskeletal negative musculoskeletal ROS (+)   Abdominal (+)  Abdomen: soft. Bowel sounds: normal.  Peds  Hematology negative hematology ROS (+)   Anesthesia Other Findings   Reproductive/Obstetrics                             Anesthesia Physical Anesthesia Plan  ASA: 2  Anesthesia Plan: General   Post-op Pain Management:    Induction: Intravenous  PONV Risk Score and Plan: 3 and Ondansetron, Dexamethasone, Midazolam and Treatment may vary due to age or medical condition  Airway Management Planned: Mask and Oral ETT  Additional Equipment: None  Intra-op Plan:   Post-operative Plan: Extubation in OR  Informed Consent: I have reviewed the patients History and Physical, chart, labs and discussed the procedure including the risks, benefits and alternatives for the proposed anesthesia with the patient or authorized representative who has indicated his/her understanding and acceptance.     Dental advisory given  Plan Discussed with: CRNA  Anesthesia Plan Comments: (Lab Results      Component                Value               Date                      WBC                      5.5                 01/14/2021                HGB                      12.3                 01/14/2021                HCT                      37.7                01/14/2021                MCV                      93.3                01/14/2021                PLT  256                 01/14/2021           )        Anesthesia Quick Evaluation

## 2021-01-14 NOTE — Anesthesia Procedure Notes (Signed)

## 2021-01-14 NOTE — Op Note (Signed)
Preoperative diagnosis: Symptomatic cystocele Postoperative diagnosis: same and small rectocele Procedure: Anterior and posterior repair Surgeon: Cheri Fowler M.D. Asst.: Paula Compton, MD Anesthesia: GETA Findings: She had a Gr II cystocele and a Gr I rectocele.  Assistance from Dr. Marvel Plan was necessary for exposure and retraction  estimated blood loss: 628 cc Complications: None Specimens: None  Procedure in detail:  The patient was taken to the operating room and placed in the dorsosupine position. General anesthesia was induced, legs were placed in mobile stirrups. Perineum and vagina were then prepped and draped in usual sterile fashion and bladder drained with a Robinson catheter. A weighted speculum was placed in the vagina. The anterior vagina was grasped with 2 Allis clamps at the cervicovaginal junction and a small horizontal piece of vaginal tissue was removed. The cystocele was then mobilized by dissecting anteriorly in the midline to within about 2 cm of the urethral meatus in the midline. Cystocele was mobilized laterally, hemostasis achieved with Bovie and interrupted 3-0 Vicryl. Cystocele was then reduced with several interrupted sutures of 2-0 Vicryl with adequate reduction. Excess vaginal tissue was removed and the incision was closed with running locking 2-0 Vicryl with adequate closure and adequate hemostasis.  Attention was now turned to the rectocele repair. The hymenal ring was grasped with 2 Allis clamps at a distance that when brought together would still allow two fingers to  easily pass into the vagina. A horizontal piece of tissue was removed. The posterior vaginal mucosa was then dissected in the midline from the hymenal ring to the posterior cervicovaginal junction. Rectocele was then mobilized bilaterally sharply and bluntly. The rectocele was then reduced with interrupted sutures of 2-0 Vicryl after a rectal exam confirmed this was all rectocele. This achieved  good reduction.  Excess vaginal mucosa was then removed sharply. Vagina was closed from the vaginal apex to the hymenal ring with running locking 2-0 Vicryl. This achieved adequate closure and adequate hemostasis. The vagina was then packed with 2 inch gauze coated with gel. A Foley catheter was also placed. The patient tolerated the procedure well. She was awakened and taken to the recovery in stable condition. Counts were correct, she had PAS hose on throughout the procedure. She received Ancef 2 g for surgical prophylaxis

## 2021-01-14 NOTE — Anesthesia Postprocedure Evaluation (Signed)
Anesthesia Post Note  Patient: [name]  Procedure(s) Performed: ANTERIOR (CYSTOCELE) AND POSTERIOR REPAIR (RECTOCELE) (Vagina )     Patient location during evaluation: PACU Anesthesia Type: General Level of consciousness: awake and alert Pain management: pain level controlled Vital Signs Assessment: post-procedure vital signs reviewed and stable Respiratory status: spontaneous breathing, nonlabored ventilation, respiratory function stable and patient connected to nasal cannula oxygen Cardiovascular status: blood pressure returned to baseline and stable Postop Assessment: no apparent nausea or vomiting Anesthetic complications: no   No notable events documented.  Last Vitals:  Vitals:   01/14/21 1215 01/14/21 1239  BP: 104/67 109/68  Pulse: 71 70  Resp: 16 15  Temp: 36.6 C   SpO2: 95% 95%    Last Pain:  Vitals:   01/14/21 1215  TempSrc:   PainSc: 0-No pain                 Belenda Cruise P Monnie Gudgel

## 2021-01-15 ENCOUNTER — Other Ambulatory Visit (HOSPITAL_COMMUNITY): Payer: Self-pay

## 2021-01-15 ENCOUNTER — Telehealth: Payer: Self-pay

## 2021-01-15 ENCOUNTER — Encounter (HOSPITAL_BASED_OUTPATIENT_CLINIC_OR_DEPARTMENT_OTHER): Payer: Self-pay | Admitting: Obstetrics and Gynecology

## 2021-01-15 DIAGNOSIS — Z7951 Long term (current) use of inhaled steroids: Secondary | ICD-10-CM | POA: Diagnosis not present

## 2021-01-15 DIAGNOSIS — Z881 Allergy status to other antibiotic agents status: Secondary | ICD-10-CM | POA: Diagnosis not present

## 2021-01-15 DIAGNOSIS — Z79899 Other long term (current) drug therapy: Secondary | ICD-10-CM | POA: Diagnosis not present

## 2021-01-15 DIAGNOSIS — N814 Uterovaginal prolapse, unspecified: Secondary | ICD-10-CM | POA: Diagnosis not present

## 2021-01-15 DIAGNOSIS — Z888 Allergy status to other drugs, medicaments and biological substances status: Secondary | ICD-10-CM | POA: Diagnosis not present

## 2021-01-15 DIAGNOSIS — Z90710 Acquired absence of both cervix and uterus: Secondary | ICD-10-CM | POA: Diagnosis not present

## 2021-01-15 DIAGNOSIS — Z8616 Personal history of COVID-19: Secondary | ICD-10-CM | POA: Diagnosis not present

## 2021-01-15 LAB — CBC
HCT: 32 % — ABNORMAL LOW (ref 36.0–46.0)
Hemoglobin: 10.2 g/dL — ABNORMAL LOW (ref 12.0–15.0)
MCH: 30.3 pg (ref 26.0–34.0)
MCHC: 31.9 g/dL (ref 30.0–36.0)
MCV: 95 fL (ref 80.0–100.0)
Platelets: 225 10*3/uL (ref 150–400)
RBC: 3.37 MIL/uL — ABNORMAL LOW (ref 3.87–5.11)
RDW: 15.1 % (ref 11.5–15.5)
WBC: 10.7 10*3/uL — ABNORMAL HIGH (ref 4.0–10.5)
nRBC: 0 % (ref 0.0–0.2)

## 2021-01-15 MED ORDER — ACETAMINOPHEN 500 MG PO TABS
ORAL_TABLET | ORAL | Status: AC
  Filled 2021-01-15: qty 2

## 2021-01-15 MED ORDER — IBUPROFEN 600 MG PO TABS
600.0000 mg | ORAL_TABLET | Freq: Four times a day (QID) | ORAL | 0 refills | Status: DC
  Filled 2021-01-15: qty 30, 8d supply, fill #0

## 2021-01-15 MED ORDER — OXYCODONE HCL 5 MG PO TABS
ORAL_TABLET | ORAL | Status: AC
  Filled 2021-01-15: qty 1

## 2021-01-15 MED ORDER — GABAPENTIN 300 MG PO CAPS
300.0000 mg | ORAL_CAPSULE | Freq: Three times a day (TID) | ORAL | 0 refills | Status: DC
  Filled 2021-01-15: qty 6, 2d supply, fill #0

## 2021-01-15 MED ORDER — SENNA 8.6 MG PO TABS
1.0000 | ORAL_TABLET | Freq: Two times a day (BID) | ORAL | 0 refills | Status: DC
  Filled 2021-01-15: qty 120, 60d supply, fill #0

## 2021-01-15 MED ORDER — OXYCODONE HCL 5 MG PO TABS
5.0000 mg | ORAL_TABLET | ORAL | 0 refills | Status: DC | PRN
  Filled 2021-01-15: qty 10, 2d supply, fill #0

## 2021-01-15 MED ORDER — KETOROLAC TROMETHAMINE 30 MG/ML IJ SOLN
INTRAMUSCULAR | Status: AC
  Filled 2021-01-15: qty 1

## 2021-01-15 NOTE — Discharge Summary (Signed)
Physician Discharge Summary  Patient ID: [name] MRN: [id number] DOB/AGE: 03/16/1971 50 y.o.  Admit date: 01/14/2021 Discharge date: 01/15/2021  Admission Diagnoses:  Symptomatic cystocele  Discharge Diagnoses: Same and rectocele Active Problems:   Cystocele with prolapse   Discharged Condition: good  Hospital Course: Admitted and underwent A&P repair without complications.  No problems overnight.  On POD #1, foley and vaginal packing removed, good pain control, stable for discharge   Discharge Exam: Blood pressure (!) 111/58, pulse 65, temperature 98.4 F (36.9 C), resp. rate 18, height 5\' 3"  (1.6 m), weight 76.2 kg, SpO2 97 %. General appearance: alert  Disposition: Discharge disposition: 01-Home or Self Care       Discharge Instructions     Call MD for:  difficulty breathing, headache or visual disturbances   Complete by: As directed    Call MD for:  extreme fatigue   Complete by: As directed    Call MD for:  persistant dizziness or light-headedness   Complete by: As directed    Call MD for:  persistant nausea and vomiting   Complete by: As directed    Call MD for:  severe uncontrolled pain   Complete by: As directed    Call MD for:  temperature >100.4   Complete by: As directed    Diet - low sodium heart healthy   Complete by: As directed    Increase activity slowly   Complete by: As directed    Lifting restrictions   Complete by: As directed    10 lbs   Sexual Activity Restrictions   Complete by: As directed    Pelvic rest      Allergies as of 01/15/2021       Reactions   Kiwi Extract Shortness Of Breath, Swelling   Ativan [lorazepam] Other (See Comments)   Hyperactivity per patient   Erythromycin Base Nausea And Vomiting   Nitrofurantoin Nausea And Vomiting   Xanax [alprazolam] Other (See Comments)   Makes patient hyperactive         Medication List     TAKE these medications    Advair Diskus 100-50 MCG/ACT Aepb Generic drug:  fluticasone-salmeterol INHALE 1 PUFF INTO THE LUNGS TWICE DAILY What changed: See the new instructions.   albuterol 108 (90 Base) MCG/ACT inhaler Commonly known as: VENTOLIN HFA Inhale 2 puffs into the lungs every 6 (six) hours as needed for wheezing or shortness of breath (cough, shortness of breath or wheezing.).   folic acid 1 MG tablet Commonly known as: FOLVITE TAKE 1 TABLET BY MOUT ONCE DAILY What changed:  how much to take how to take this when to take this   gabapentin 300 MG capsule Commonly known as: NEURONTIN Take 1 capsule (300 mg total) by mouth 3 (three) times daily for 2 days.   hydrOXYzine 25 MG capsule Commonly known as: VISTARIL Take 1 capsule (25 mg total) by mouth every 8 (eight) hours as needed.   ibuprofen 600 MG tablet Commonly known as: ADVIL Take 1 tablet (600 mg total) by mouth every 6 (six) hours.   methotrexate 2.5 MG tablet TAKE 8 TABLETS BY MOUTH EVERY 7 DAYS What changed:  how much to take how to take this when to take this additional instructions   montelukast 10 MG tablet Commonly known as: SINGULAIR TAKE 1 TABLET BY MOUTH EVERY DAY   oxyCODONE 5 MG immediate release tablet Commonly known as: Oxy IR/ROXICODONE Take 1 tablet (5 mg total) by mouth every 4 (four) hours  as needed for severe pain.   pantoprazole 40 MG tablet Commonly known as: PROTONIX TAKE ONE TABLET BY MOUTH TWICE A DAY BEFORE FOOD What changed: when to take this   senna 8.6 MG Tabs tablet Commonly known as: SENOKOT Take 1 tablet (8.6 mg total) by mouth 2 (two) times daily.   sertraline 50 MG tablet Commonly known as: ZOLOFT Take 3 tablets (150 mg total) by mouth in the morning.   sucralfate 1 g tablet Commonly known as: CARAFATE take 1 tablet by mouth on an empty stomach 30 min before meal What changed:  how much to take how to take this when to take this reasons to take this   Vitamin D (Ergocalciferol) 1.25 MG (50000 UNIT) Caps capsule Commonly known  as: DRISDOL Take 1 capsule (50,000 Units total) by mouth every 7 (seven) days. (taking one tablet per week) walk in lab in office 1-2 weeks after completing prescription. What changed: additional instructions        Follow-up Information     Jacqualin Shirkey, MD. Schedule an appointment as soon as possible for a visit in 2 week(s).   Specialty: Obstetrics and Gynecology Contact information: [address] [place] [address] [place] [address] [phone]                 Signed: Blane Ohara Keyonta Barradas 01/15/2021, 7:38 AM

## 2021-01-15 NOTE — Progress Notes (Signed)
1 Day Post-Op Procedure(s) (LRB): ANTERIOR (CYSTOCELE) AND POSTERIOR REPAIR (RECTOCELE) (N/A)  Subjective: Patient reports incisional pain and tolerating PO.    Objective: I have reviewed patient's vital signs, intake and output, and labs.  General: alert Vaginal Bleeding: minimal, vaginal packing removed with moderate stain  Assessment: s/p Procedure(s): ANTERIOR (CYSTOCELE) AND POSTERIOR REPAIR (RECTOCELE) (N/A): progressing well  Plan: Advance diet Encourage ambulation Discontinue IV fluids Discharge home  LOS: 0 days    [name] [name] 01/15/2021, 7:30 AM

## 2021-01-15 NOTE — Discharge Instructions (Signed)
Tine instructions for A&P repair

## 2021-01-15 NOTE — Telephone Encounter (Signed)
Lvm to r/s 7/22 if pt calls back contact office

## 2021-01-16 ENCOUNTER — Ambulatory Visit (INDEPENDENT_AMBULATORY_CARE_PROVIDER_SITE_OTHER): Payer: 59 | Admitting: Internal Medicine

## 2021-01-16 ENCOUNTER — Other Ambulatory Visit: Payer: Self-pay

## 2021-01-16 ENCOUNTER — Encounter: Payer: Self-pay | Admitting: Internal Medicine

## 2021-01-16 VITALS — BP 140/78 | HR 60 | Temp 98.4°F | Ht 63.0 in | Wt 177.6 lb

## 2021-01-16 DIAGNOSIS — E785 Hyperlipidemia, unspecified: Secondary | ICD-10-CM

## 2021-01-16 DIAGNOSIS — R768 Other specified abnormal immunological findings in serum: Secondary | ICD-10-CM | POA: Diagnosis not present

## 2021-01-16 DIAGNOSIS — E611 Iron deficiency: Secondary | ICD-10-CM | POA: Diagnosis not present

## 2021-01-16 DIAGNOSIS — D72829 Elevated white blood cell count, unspecified: Secondary | ICD-10-CM

## 2021-01-16 DIAGNOSIS — M313 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: Secondary | ICD-10-CM

## 2021-01-16 DIAGNOSIS — Z1231 Encounter for screening mammogram for malignant neoplasm of breast: Secondary | ICD-10-CM

## 2021-01-16 DIAGNOSIS — K449 Diaphragmatic hernia without obstruction or gangrene: Secondary | ICD-10-CM

## 2021-01-16 DIAGNOSIS — E559 Vitamin D deficiency, unspecified: Secondary | ICD-10-CM | POA: Diagnosis not present

## 2021-01-16 DIAGNOSIS — F419 Anxiety disorder, unspecified: Secondary | ICD-10-CM | POA: Diagnosis not present

## 2021-01-16 DIAGNOSIS — F32A Depression, unspecified: Secondary | ICD-10-CM

## 2021-01-16 DIAGNOSIS — Z0000 Encounter for general adult medical examination without abnormal findings: Secondary | ICD-10-CM | POA: Diagnosis not present

## 2021-01-16 DIAGNOSIS — D649 Anemia, unspecified: Secondary | ICD-10-CM

## 2021-01-16 LAB — COMPREHENSIVE METABOLIC PANEL
ALT: 7 U/L (ref 0–35)
AST: 14 U/L (ref 0–37)
Albumin: 3.7 g/dL (ref 3.5–5.2)
Alkaline Phosphatase: 50 U/L (ref 39–117)
BUN: 16 mg/dL (ref 6–23)
CO2: 27 mEq/L (ref 19–32)
Calcium: 8.5 mg/dL (ref 8.4–10.5)
Chloride: 108 mEq/L (ref 96–112)
Creatinine, Ser: 0.71 mg/dL (ref 0.40–1.20)
GFR: 98.66 mL/min (ref >60.00)
Glucose, Bld: 76 mg/dL (ref 70–99)
Potassium: 4.2 mEq/L (ref 3.5–5.1)
Sodium: 142 mEq/L (ref 135–145)
Total Bilirubin: 0.4 mg/dL (ref 0.2–1.2)
Total Protein: 5.7 g/dL — ABNORMAL LOW (ref 6.0–8.3)

## 2021-01-16 LAB — LIPID PANEL
Cholesterol: 180 mg/dL (ref 0–200)
HDL: 62.9 mg/dL (ref >39.00)
LDL Cholesterol: 92 mg/dL (ref 0–99)
NonHDL: 116.87
Total CHOL/HDL Ratio: 3
Triglycerides: 122 mg/dL (ref 0.0–149.0)
VLDL: 24.4 mg/dL (ref 0.0–40.0)

## 2021-01-16 LAB — VITAMIN D 25 HYDROXY (VIT D DEFICIENCY, FRACTURES): VITD: 36.95 ng/mL (ref 30.00–100.00)

## 2021-01-16 MED ORDER — SERTRALINE HCL 100 MG PO TABS
150.0000 mg | ORAL_TABLET | Freq: Every day | ORAL | 3 refills | Status: DC
  Filled 2021-01-16: qty 140, 93d supply, fill #0
  Filled 2021-02-11: qty 135, 90d supply, fill #0
  Filled 2021-05-01: qty 135, 90d supply, fill #1
  Filled 2021-08-20: qty 135, 90d supply, fill #2
  Filled 2021-12-08: qty 135, 90d supply, fill #3

## 2021-01-16 MED ORDER — HYDROXYZINE PAMOATE 25 MG PO CAPS
25.0000 mg | ORAL_CAPSULE | Freq: Every day | ORAL | 3 refills | Status: DC | PRN
  Filled 2021-01-16 – 2021-05-01 (×2): qty 90, 90d supply, fill #0
  Filled 2021-08-20: qty 90, 90d supply, fill #1
  Filled 2021-12-08: qty 90, 90d supply, fill #2

## 2021-01-16 NOTE — Progress Notes (Signed)
Chief Complaint  Patient presents with   Annual Exam   Annual  1. Anxiety and depression on zoloft 150 mg qd and vistaril 25 mg qd step dad died yesterday of bladder cancer and mom died in fall pancreatic cancer  2. Refill meds needs referrals rheumatology and HLD referred cards    Review of Systems  Constitutional:  Negative for weight loss.  HENT:  Negative for hearing loss.   Eyes:  Negative for blurred vision.  Respiratory:  Negative for shortness of breath.   Cardiovascular:  Negative for chest pain.  Gastrointestinal:  Negative for abdominal pain.  Musculoskeletal:  Positive for falls.  Skin:  Negative for rash.  Neurological:  Negative for headaches.  Psychiatric/Behavioral:  Negative for depression. The patient is not nervous/anxious.        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[place])    [facility]---  dx 2015,  takes methotrexate wkly, pt stated she is stable   Past Surgical History:  Procedure Laterality Date   ANTERIOR AND POSTERIOR REPAIR N/A 01/14/2021   Procedure: ANTERIOR (CYSTOCELE) AND POSTERIOR REPAIR (RECTOCELE);  Surgeon: Cheri Fowler, MD;  Location[facility];  Service: Gynecology;  Laterality: N/A;   BREAST ENHANCEMENT SURGERY  2009   CARPAL TUNNEL RELEASE Right 2011   LAPAROSCOPIC SUPRACERVICAL HYSTERECTOMY  02/21/2010   @ [facility] by Dr Willis Modena   NASAL SEPTUM SURGERY  01/2018   insertion button prosthesis for septal perforation   RHINOPLASTY  2005   nasal  fracture   Family History  Problem Relation Age of Onset   Asthma Mother    Depression Mother    Diabetes Mother    Hyperlipidemia Mother    Hypertension Mother    Mental illness Mother    Arthritis Mother    Pancreatic cancer Mother    Heart disease Father 35       AMI/CABG   Mental illness Sister    Alzheimer's disease Maternal Grandmother    Hyperlipidemia Maternal Grandmother    Hypertension Maternal Grandmother    Heart attack Maternal Grandfather    Lung cancer Maternal Grandfather    Myasthenia gravis Daughter    Charcot-Marie-Tooth disease Daughter    ADD / ADHD Daughter    Pancreatic cancer Maternal Aunt    Social History   Socioeconomic History   Marital status: Divorced    Spouse name: Not on file   Number of children: Not on file   Years of education: Not on file   Highest education level: Not on file  Occupational History   Not on file  Tobacco Use   Smoking status: Never   Smokeless tobacco: Never  Vaping Use   Vaping Use: Never used  Substance and Sexual Activity   Alcohol use: Yes    Comment: occasional   Drug use: Never   Sexual activity: Yes    Birth control/protection: Surgical  Other Topics Concern   Not on file  Social History Narrative   Marital status: separated in 11/2013; married x 22 years.  Dating x October 2016      Children:  2 children (14, 16).      Lives: with 1 children; other daughter living in [place] with father.      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      Tobacco:  None      Alcohol: socially; weekends.      Exercise: 3 times per week; aerobics; elliptical; [employer].      Seatbelt: 100%; no texting      Sexual activity:  Yes; no STDs.   Social Determinants of Health   Financial Resource Strain: Not on file  Food Insecurity: Not on file  Transportation Needs: Not on file  Physical Activity: Not on file  Stress: Not  on file  Social Connections: Not on file  Intimate Partner Violence: Not on file   Current Meds  Medication Sig   folic acid (FOLVITE) 1 MG tablet TAKE 1 TABLET BY MOUT ONCE DAILY   ibuprofen (ADVIL) 600 MG tablet Take 1 tablet (600 mg total) by mouth every 6 (six) hours.   methotrexate 2.5 MG tablet TAKE 8 TABLETS BY MOUTH EVERY 7 DAYS   montelukast (SINGULAIR) 10 MG tablet TAKE 1 TABLET BY MOUTH EVERY DAY   pantoprazole (PROTONIX) 40 MG tablet TAKE ONE TABLET BY MOUTH TWICE A DAY BEFORE FOOD   senna (SENOKOT) 8.6 MG TABS tablet Take 1 tablet (8.6 mg total) by mouth 2 (two) times daily.   sucralfate (CARAFATE) 1 g tablet take 1 tablet by mouth on an empty stomach 30 min before meal   Vitamin D, Ergocalciferol, (DRISDOL) 1.25 MG (50000 UNIT) CAPS capsule Take 1 capsule (50,000 Units total) by mouth every 7 (seven) days. (taking one tablet per week) walk in lab in office 1-2 weeks after completing prescription.   [DISCONTINUED] hydrOXYzine (VISTARIL) 25 MG capsule Take 1 capsule (25 mg total) by mouth every 8 (eight) hours as needed.   [DISCONTINUED] sertraline (ZOLOFT) 50 MG tablet Take 3 tablets (150 mg total) by mouth in the morning.   Allergies  Allergen Reactions   Kiwi Extract Shortness Of Breath and Swelling   Ativan [Lorazepam] Other (See Comments)    Hyperactivity per patient   Erythromycin Base Nausea And Vomiting   Nitrofurantoin Nausea And Vomiting   Xanax [Alprazolam] Other (See Comments)    Makes patient hyperactive    Recent Results (from the past 2160 hour(s))  SARS CORONAVIRUS 2 (TAT 6-24 HRS) Nasopharyngeal Nasopharyngeal Swab     Status: None   Collection Time: 01/10/21 12:07 PM   Specimen: Nasopharyngeal Swab  Result Value Ref Range   SARS Coronavirus 2 NEGATIVE NEGATIVE    Comment: (NOTE) SARS-CoV-2 target nucleic acids are NOT DETECTED.  The SARS-CoV-2 RNA is generally detectable in upper and lower respiratory specimens during the acute phase of infection.  Negative results do not preclude SARS-CoV-2 infection, do not rule out co-infections with other pathogens, and should not be used as the sole basis for treatment or other patient management decisions. Negative results must be combined with clinical observations, patient history, and epidemiological information. The expected result is Negative.  Fact Sheet for Patients: SugarRoll.be  Fact Sheet for Healthcare Providers: https://www.woods-mathews.com/  This test is not yet approved or cleared by the [place] FDA and  has been authorized for detection and/or diagnosis of  SARS-CoV-2 by FDA under an Emergency Use Authorization (EUA). This EUA will remain  in effect (meaning this test can be used) for the duration of the COVID-19 declaration under Se ction 564(b)(1) of the Act, 21 U.S.C. section 360bbb-3(b)(1), unless the authorization is terminated or revoked sooner.  Performed at [facility], [place] [address], [place], [place] [address]   CBC     Status: None   Collection Time: 01/14/21  7:19 AM  Result Value Ref Range   WBC 5.5 4.0 - 10.5 K/uL   RBC 4.04 3.87 - 5.11 MIL/uL   Hemoglobin 12.3 12.0 - 15.0 g/dL   HCT 37.7 36.0 - 46.0 %   MCV 93.3 80.0 - 100.0 fL   MCH 30.4 26.0 - 34.0 pg   MCHC 32.6 30.0 - 36.0 g/dL   RDW 15.0 11.5 - 15.5 %   Platelets 256 150 - 400 K/uL   nRBC 0.0 0.0 - 0.2 %    Comment: Performed at [facility], [place] [address] [place], [place] [address]  CBC     Status: Abnormal   Collection Time: 01/15/21  2:20 AM  Result Value Ref Range   WBC 10.7 (H) 4.0 - 10.5 K/uL   RBC 3.37 (L) 3.87 - 5.11 MIL/uL   Hemoglobin 10.2 (L) 12.0 - 15.0 g/dL   HCT 32.0 (L) 36.0 - 46.0 %   MCV 95.0 80.0 - 100.0 fL   MCH 30.3 26.0 - 34.0 pg   MCHC 31.9 30.0 - 36.0 g/dL   RDW 15.1 11.5 - 15.5 %   Platelets 225 150 - 400 K/uL   nRBC 0.0 0.0 - 0.2 %    Comment: Performed at [facility],  [place] [address], [place], [place] [address]   Objective  Body mass index is 31.46 kg/m. Wt Readings from Last 3 Encounters:  01/16/21 177 lb 9.6 oz (80.6 kg)  01/14/21 168 lb 1.6 oz (76.2 kg)  06/18/20 162 lb 9.6 oz (73.8 kg)   Temp Readings from Last 3 Encounters:  01/16/21 98.4 F (36.9 C) (Oral)  01/15/21 98.4 F (36.9 C)  09/13/20 98.1 F (36.7 C) (Oral)   BP Readings from Last 3 Encounters:  01/16/21 140/78  01/15/21 120/69  09/13/20 106/70   Pulse Readings from Last 3 Encounters:  01/16/21 60  01/15/21 60  09/13/20 88    Physical Exam Vitals and nursing note reviewed.  Constitutional:      Appearance: Normal appearance. She is well-developed and well-groomed. She is obese.  HENT:     Head: Normocephalic and atraumatic.  Eyes:     Conjunctiva/sclera: Conjunctivae normal.     Pupils: Pupils are equal, round, and reactive to light.  Cardiovascular:     Rate and Rhythm: Normal rate and regular rhythm.     Heart sounds: Normal heart sounds. No murmur heard. Pulmonary:     Effort: Pulmonary effort is normal.     Breath sounds: Normal breath sounds.  Skin:    General: Skin is warm and dry.  Neurological:     General: No focal deficit present.     Mental Status: She is alert and oriented to person, place, and time. Mental status is at baseline.     Gait: Gait normal.  Psychiatric:        Attention and Perception: Attention and perception normal.        Mood and Affect: Mood and affect normal.        Speech: Speech  normal.        Behavior: Behavior normal. Behavior is cooperative.        Thought Content: Thought content normal.        Cognition and Memory: Cognition and memory normal.        Judgment: Judgment normal.    Assessment  Plan  Annual physical exam - Plan: Lipid panel, Comprehensive metabolic panel Flu utd  Covid 2/2 declines further  Tdap utd  Shingrix 2/2  Utd prevnar and pna 23   Mammo referred  Colonoscopy [facility] ROI Pap had 08/25/18  neg neg HPV ob/gyn Dr. 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 Olivia Mackie McLean-Scocuzza-Internal Medicine

## 2021-01-17 ENCOUNTER — Other Ambulatory Visit: Payer: Self-pay

## 2021-01-17 LAB — IRON,TIBC AND FERRITIN PANEL
%SAT: 9 % — ABNORMAL LOW (ref 16–45)
Ferritin: 13 ng/mL — ABNORMAL LOW (ref 16–232)
Iron: 35 ug/dL — ABNORMAL LOW (ref 45–160)
TIBC: 379 ug/dL (ref 250–450)

## 2021-01-20 ENCOUNTER — Encounter: Payer: Self-pay | Admitting: Internal Medicine

## 2021-01-20 NOTE — Telephone Encounter (Signed)
-----   Message from Delorise Jackson, MD sent at 01/20/2021 10:11 AM EDT ----- Iron low has iron deficiency anemia -when was last colonoscopy with [facility]? Has she had upper endoscopy?  Does she want to f/u with hematology/blood specialists?   Rec multivitamin daily with iron  Vit d normal  Cholesterol improved great work  Liver kidneys normal  Protein low rec premier protein shake otc

## 2021-01-20 NOTE — Telephone Encounter (Signed)
Pt is probably correct  I need records EGD/colonoscopy Wallie Char signed at her visit   Thanks

## 2021-01-21 NOTE — Telephone Encounter (Signed)
Faxed release of information, awaiting return fax

## 2021-02-07 ENCOUNTER — Ambulatory Visit: Payer: 59 | Admitting: Nurse Practitioner

## 2021-02-07 ENCOUNTER — Encounter: Payer: 59 | Admitting: Family Medicine

## 2021-02-11 ENCOUNTER — Other Ambulatory Visit: Payer: Self-pay

## 2021-02-11 ENCOUNTER — Other Ambulatory Visit: Payer: Self-pay | Admitting: Adult Health

## 2021-02-12 ENCOUNTER — Other Ambulatory Visit: Payer: Self-pay

## 2021-02-13 ENCOUNTER — Other Ambulatory Visit: Payer: Self-pay | Admitting: Internal Medicine

## 2021-02-13 ENCOUNTER — Other Ambulatory Visit: Payer: Self-pay

## 2021-02-21 ENCOUNTER — Other Ambulatory Visit: Payer: Self-pay

## 2021-02-21 DIAGNOSIS — H9041 Sensorineural hearing loss, unilateral, right ear, with unrestricted hearing on the contralateral side: Secondary | ICD-10-CM | POA: Diagnosis not present

## 2021-02-21 DIAGNOSIS — Z79899 Other long term (current) drug therapy: Secondary | ICD-10-CM | POA: Diagnosis not present

## 2021-02-21 DIAGNOSIS — M313 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: Secondary | ICD-10-CM | POA: Diagnosis not present

## 2021-02-21 DIAGNOSIS — R768 Other specified abnormal immunological findings in serum: Secondary | ICD-10-CM | POA: Diagnosis not present

## 2021-02-21 MED ORDER — METHOTREXATE 2.5 MG PO TABS
ORAL_TABLET | ORAL | 1 refills | Status: DC
  Filled 2021-02-21: qty 96, 84d supply, fill #0
  Filled 2021-05-01: qty 96, 84d supply, fill #1

## 2021-02-28 ENCOUNTER — Telehealth: Payer: Self-pay

## 2021-02-28 NOTE — Telephone Encounter (Signed)
Confirmed fax for [name] records release form to [facility]. Sent to scan

## 2021-03-04 ENCOUNTER — Ambulatory Visit: Payer: 59 | Admitting: Cardiovascular Disease

## 2021-03-04 NOTE — Progress Notes (Deleted)
NO SHOW

## 2021-03-05 ENCOUNTER — Encounter: Payer: Self-pay | Admitting: Cardiovascular Disease

## 2021-03-11 ENCOUNTER — Telehealth: Payer: Self-pay

## 2021-03-11 ENCOUNTER — Other Ambulatory Visit: Payer: Self-pay

## 2021-03-11 MED ORDER — MONTELUKAST SODIUM 10 MG PO TABS
10.0000 mg | ORAL_TABLET | Freq: Every day | ORAL | 6 refills | Status: DC
  Filled 2021-03-11 – 2021-05-01 (×3): qty 30, 30d supply, fill #0
  Filled 2021-06-24: qty 30, 30d supply, fill #1
  Filled 2021-08-07: qty 30, 30d supply, fill #2
  Filled 2021-11-06: qty 30, 30d supply, fill #3
  Filled 2021-12-08: qty 30, 30d supply, fill #4
  Filled 2022-01-27: qty 30, 30d supply, fill #5
  Filled 2022-02-25: qty 30, 30d supply, fill #6

## 2021-03-11 NOTE — Telephone Encounter (Signed)
Pt needs refill of montelukast (SINGULAIR) 10 MG tablet sent to [facility] employee pharmacy. Please advise ASAP

## 2021-03-27 ENCOUNTER — Other Ambulatory Visit: Payer: Self-pay

## 2021-03-31 ENCOUNTER — Ambulatory Visit: Payer: 59 | Admitting: Nurse Practitioner

## 2021-04-01 ENCOUNTER — Other Ambulatory Visit: Payer: Self-pay

## 2021-04-11 ENCOUNTER — Other Ambulatory Visit: Payer: Self-pay

## 2021-04-11 ENCOUNTER — Telehealth: Payer: Self-pay | Admitting: Internal Medicine

## 2021-04-11 NOTE — Telephone Encounter (Signed)
Lft pt vm to call [facility] to sch mammo. thanks

## 2021-05-01 ENCOUNTER — Other Ambulatory Visit: Payer: Self-pay

## 2021-05-01 MED FILL — Folic Acid Tab 1 MG: ORAL | 90 days supply | Qty: 90 | Fill #1 | Status: AC

## 2021-05-13 DIAGNOSIS — M5416 Radiculopathy, lumbar region: Secondary | ICD-10-CM | POA: Diagnosis not present

## 2021-05-13 DIAGNOSIS — M9904 Segmental and somatic dysfunction of sacral region: Secondary | ICD-10-CM | POA: Diagnosis not present

## 2021-05-13 DIAGNOSIS — M25474 Effusion, right foot: Secondary | ICD-10-CM | POA: Diagnosis not present

## 2021-05-13 DIAGNOSIS — M9901 Segmental and somatic dysfunction of cervical region: Secondary | ICD-10-CM | POA: Diagnosis not present

## 2021-05-13 DIAGNOSIS — M5412 Radiculopathy, cervical region: Secondary | ICD-10-CM | POA: Diagnosis not present

## 2021-05-13 DIAGNOSIS — M9903 Segmental and somatic dysfunction of lumbar region: Secondary | ICD-10-CM | POA: Diagnosis not present

## 2021-05-13 DIAGNOSIS — M7918 Myalgia, other site: Secondary | ICD-10-CM | POA: Diagnosis not present

## 2021-05-13 DIAGNOSIS — M7741 Metatarsalgia, right foot: Secondary | ICD-10-CM | POA: Diagnosis not present

## 2021-05-14 ENCOUNTER — Encounter: Payer: Self-pay | Admitting: Adult Health

## 2021-05-26 DIAGNOSIS — M5412 Radiculopathy, cervical region: Secondary | ICD-10-CM | POA: Diagnosis not present

## 2021-05-26 DIAGNOSIS — M5416 Radiculopathy, lumbar region: Secondary | ICD-10-CM | POA: Diagnosis not present

## 2021-05-26 DIAGNOSIS — M25474 Effusion, right foot: Secondary | ICD-10-CM | POA: Diagnosis not present

## 2021-05-26 DIAGNOSIS — M9901 Segmental and somatic dysfunction of cervical region: Secondary | ICD-10-CM | POA: Diagnosis not present

## 2021-05-26 DIAGNOSIS — M7741 Metatarsalgia, right foot: Secondary | ICD-10-CM | POA: Diagnosis not present

## 2021-05-26 DIAGNOSIS — M9904 Segmental and somatic dysfunction of sacral region: Secondary | ICD-10-CM | POA: Diagnosis not present

## 2021-05-26 DIAGNOSIS — M9903 Segmental and somatic dysfunction of lumbar region: Secondary | ICD-10-CM | POA: Diagnosis not present

## 2021-05-26 DIAGNOSIS — M7918 Myalgia, other site: Secondary | ICD-10-CM | POA: Diagnosis not present

## 2021-05-27 DIAGNOSIS — M7918 Myalgia, other site: Secondary | ICD-10-CM | POA: Diagnosis not present

## 2021-05-27 DIAGNOSIS — M9904 Segmental and somatic dysfunction of sacral region: Secondary | ICD-10-CM | POA: Diagnosis not present

## 2021-05-27 DIAGNOSIS — M7741 Metatarsalgia, right foot: Secondary | ICD-10-CM | POA: Diagnosis not present

## 2021-05-27 DIAGNOSIS — M5412 Radiculopathy, cervical region: Secondary | ICD-10-CM | POA: Diagnosis not present

## 2021-05-27 DIAGNOSIS — M5416 Radiculopathy, lumbar region: Secondary | ICD-10-CM | POA: Diagnosis not present

## 2021-05-27 DIAGNOSIS — M9903 Segmental and somatic dysfunction of lumbar region: Secondary | ICD-10-CM | POA: Diagnosis not present

## 2021-05-27 DIAGNOSIS — M9901 Segmental and somatic dysfunction of cervical region: Secondary | ICD-10-CM | POA: Diagnosis not present

## 2021-05-27 DIAGNOSIS — M25474 Effusion, right foot: Secondary | ICD-10-CM | POA: Diagnosis not present

## 2021-05-28 DIAGNOSIS — M5412 Radiculopathy, cervical region: Secondary | ICD-10-CM | POA: Diagnosis not present

## 2021-05-28 DIAGNOSIS — M9903 Segmental and somatic dysfunction of lumbar region: Secondary | ICD-10-CM | POA: Diagnosis not present

## 2021-05-28 DIAGNOSIS — M9904 Segmental and somatic dysfunction of sacral region: Secondary | ICD-10-CM | POA: Diagnosis not present

## 2021-05-28 DIAGNOSIS — M7918 Myalgia, other site: Secondary | ICD-10-CM | POA: Diagnosis not present

## 2021-05-28 DIAGNOSIS — M5416 Radiculopathy, lumbar region: Secondary | ICD-10-CM | POA: Diagnosis not present

## 2021-05-28 DIAGNOSIS — M7741 Metatarsalgia, right foot: Secondary | ICD-10-CM | POA: Diagnosis not present

## 2021-05-28 DIAGNOSIS — M9901 Segmental and somatic dysfunction of cervical region: Secondary | ICD-10-CM | POA: Diagnosis not present

## 2021-05-28 DIAGNOSIS — M25474 Effusion, right foot: Secondary | ICD-10-CM | POA: Diagnosis not present

## 2021-06-02 ENCOUNTER — Ambulatory Visit: Payer: Self-pay | Admitting: Adult Health

## 2021-06-02 DIAGNOSIS — M5416 Radiculopathy, lumbar region: Secondary | ICD-10-CM | POA: Diagnosis not present

## 2021-06-02 DIAGNOSIS — M9901 Segmental and somatic dysfunction of cervical region: Secondary | ICD-10-CM | POA: Diagnosis not present

## 2021-06-02 DIAGNOSIS — M7918 Myalgia, other site: Secondary | ICD-10-CM | POA: Diagnosis not present

## 2021-06-02 DIAGNOSIS — M5412 Radiculopathy, cervical region: Secondary | ICD-10-CM | POA: Diagnosis not present

## 2021-06-02 DIAGNOSIS — M25474 Effusion, right foot: Secondary | ICD-10-CM | POA: Diagnosis not present

## 2021-06-02 DIAGNOSIS — M9904 Segmental and somatic dysfunction of sacral region: Secondary | ICD-10-CM | POA: Diagnosis not present

## 2021-06-02 DIAGNOSIS — M9903 Segmental and somatic dysfunction of lumbar region: Secondary | ICD-10-CM | POA: Diagnosis not present

## 2021-06-02 DIAGNOSIS — M7741 Metatarsalgia, right foot: Secondary | ICD-10-CM | POA: Diagnosis not present

## 2021-06-03 DIAGNOSIS — M25474 Effusion, right foot: Secondary | ICD-10-CM | POA: Diagnosis not present

## 2021-06-03 DIAGNOSIS — M9904 Segmental and somatic dysfunction of sacral region: Secondary | ICD-10-CM | POA: Diagnosis not present

## 2021-06-03 DIAGNOSIS — M5412 Radiculopathy, cervical region: Secondary | ICD-10-CM | POA: Diagnosis not present

## 2021-06-03 DIAGNOSIS — M7741 Metatarsalgia, right foot: Secondary | ICD-10-CM | POA: Diagnosis not present

## 2021-06-03 DIAGNOSIS — M9901 Segmental and somatic dysfunction of cervical region: Secondary | ICD-10-CM | POA: Diagnosis not present

## 2021-06-03 DIAGNOSIS — M9903 Segmental and somatic dysfunction of lumbar region: Secondary | ICD-10-CM | POA: Diagnosis not present

## 2021-06-03 DIAGNOSIS — M5416 Radiculopathy, lumbar region: Secondary | ICD-10-CM | POA: Diagnosis not present

## 2021-06-05 ENCOUNTER — Ambulatory Visit (INDEPENDENT_AMBULATORY_CARE_PROVIDER_SITE_OTHER): Payer: Self-pay | Admitting: Adult Health

## 2021-06-05 DIAGNOSIS — M7918 Myalgia, other site: Secondary | ICD-10-CM | POA: Diagnosis not present

## 2021-06-05 DIAGNOSIS — M9904 Segmental and somatic dysfunction of sacral region: Secondary | ICD-10-CM | POA: Diagnosis not present

## 2021-06-05 DIAGNOSIS — Z91199 Patient's noncompliance with other medical treatment and regimen due to unspecified reason: Secondary | ICD-10-CM

## 2021-06-05 DIAGNOSIS — M9901 Segmental and somatic dysfunction of cervical region: Secondary | ICD-10-CM | POA: Diagnosis not present

## 2021-06-05 DIAGNOSIS — M25474 Effusion, right foot: Secondary | ICD-10-CM | POA: Diagnosis not present

## 2021-06-05 DIAGNOSIS — M5416 Radiculopathy, lumbar region: Secondary | ICD-10-CM | POA: Diagnosis not present

## 2021-06-05 DIAGNOSIS — M5412 Radiculopathy, cervical region: Secondary | ICD-10-CM | POA: Diagnosis not present

## 2021-06-05 DIAGNOSIS — M7741 Metatarsalgia, right foot: Secondary | ICD-10-CM | POA: Diagnosis not present

## 2021-06-05 DIAGNOSIS — M9903 Segmental and somatic dysfunction of lumbar region: Secondary | ICD-10-CM | POA: Diagnosis not present

## 2021-06-09 DIAGNOSIS — M7918 Myalgia, other site: Secondary | ICD-10-CM | POA: Diagnosis not present

## 2021-06-09 DIAGNOSIS — M9901 Segmental and somatic dysfunction of cervical region: Secondary | ICD-10-CM | POA: Diagnosis not present

## 2021-06-09 DIAGNOSIS — M9903 Segmental and somatic dysfunction of lumbar region: Secondary | ICD-10-CM | POA: Diagnosis not present

## 2021-06-09 DIAGNOSIS — M9904 Segmental and somatic dysfunction of sacral region: Secondary | ICD-10-CM | POA: Diagnosis not present

## 2021-06-09 DIAGNOSIS — M5416 Radiculopathy, lumbar region: Secondary | ICD-10-CM | POA: Diagnosis not present

## 2021-06-09 DIAGNOSIS — M5412 Radiculopathy, cervical region: Secondary | ICD-10-CM | POA: Diagnosis not present

## 2021-06-09 NOTE — Progress Notes (Signed)
No show

## 2021-06-10 DIAGNOSIS — M7918 Myalgia, other site: Secondary | ICD-10-CM | POA: Diagnosis not present

## 2021-06-10 DIAGNOSIS — M5416 Radiculopathy, lumbar region: Secondary | ICD-10-CM | POA: Diagnosis not present

## 2021-06-10 DIAGNOSIS — M9904 Segmental and somatic dysfunction of sacral region: Secondary | ICD-10-CM | POA: Diagnosis not present

## 2021-06-10 DIAGNOSIS — M9903 Segmental and somatic dysfunction of lumbar region: Secondary | ICD-10-CM | POA: Diagnosis not present

## 2021-06-10 DIAGNOSIS — M9901 Segmental and somatic dysfunction of cervical region: Secondary | ICD-10-CM | POA: Diagnosis not present

## 2021-06-10 DIAGNOSIS — M5412 Radiculopathy, cervical region: Secondary | ICD-10-CM | POA: Diagnosis not present

## 2021-06-12 DIAGNOSIS — M9901 Segmental and somatic dysfunction of cervical region: Secondary | ICD-10-CM | POA: Diagnosis not present

## 2021-06-12 DIAGNOSIS — M9903 Segmental and somatic dysfunction of lumbar region: Secondary | ICD-10-CM | POA: Diagnosis not present

## 2021-06-12 DIAGNOSIS — M5416 Radiculopathy, lumbar region: Secondary | ICD-10-CM | POA: Diagnosis not present

## 2021-06-12 DIAGNOSIS — M9904 Segmental and somatic dysfunction of sacral region: Secondary | ICD-10-CM | POA: Diagnosis not present

## 2021-06-12 DIAGNOSIS — M7918 Myalgia, other site: Secondary | ICD-10-CM | POA: Diagnosis not present

## 2021-06-12 DIAGNOSIS — M5412 Radiculopathy, cervical region: Secondary | ICD-10-CM | POA: Diagnosis not present

## 2021-06-16 DIAGNOSIS — M7918 Myalgia, other site: Secondary | ICD-10-CM | POA: Diagnosis not present

## 2021-06-16 DIAGNOSIS — M9904 Segmental and somatic dysfunction of sacral region: Secondary | ICD-10-CM | POA: Diagnosis not present

## 2021-06-16 DIAGNOSIS — M9901 Segmental and somatic dysfunction of cervical region: Secondary | ICD-10-CM | POA: Diagnosis not present

## 2021-06-16 DIAGNOSIS — M5416 Radiculopathy, lumbar region: Secondary | ICD-10-CM | POA: Diagnosis not present

## 2021-06-16 DIAGNOSIS — M5412 Radiculopathy, cervical region: Secondary | ICD-10-CM | POA: Diagnosis not present

## 2021-06-16 DIAGNOSIS — M9903 Segmental and somatic dysfunction of lumbar region: Secondary | ICD-10-CM | POA: Diagnosis not present

## 2021-06-17 DIAGNOSIS — M9903 Segmental and somatic dysfunction of lumbar region: Secondary | ICD-10-CM | POA: Diagnosis not present

## 2021-06-17 DIAGNOSIS — M5412 Radiculopathy, cervical region: Secondary | ICD-10-CM | POA: Diagnosis not present

## 2021-06-17 DIAGNOSIS — M9904 Segmental and somatic dysfunction of sacral region: Secondary | ICD-10-CM | POA: Diagnosis not present

## 2021-06-17 DIAGNOSIS — M9901 Segmental and somatic dysfunction of cervical region: Secondary | ICD-10-CM | POA: Diagnosis not present

## 2021-06-17 DIAGNOSIS — M7918 Myalgia, other site: Secondary | ICD-10-CM | POA: Diagnosis not present

## 2021-06-17 DIAGNOSIS — M5416 Radiculopathy, lumbar region: Secondary | ICD-10-CM | POA: Diagnosis not present

## 2021-06-19 DIAGNOSIS — M9903 Segmental and somatic dysfunction of lumbar region: Secondary | ICD-10-CM | POA: Diagnosis not present

## 2021-06-19 DIAGNOSIS — M5416 Radiculopathy, lumbar region: Secondary | ICD-10-CM | POA: Diagnosis not present

## 2021-06-19 DIAGNOSIS — M9904 Segmental and somatic dysfunction of sacral region: Secondary | ICD-10-CM | POA: Diagnosis not present

## 2021-06-19 DIAGNOSIS — M7918 Myalgia, other site: Secondary | ICD-10-CM | POA: Diagnosis not present

## 2021-06-19 DIAGNOSIS — M5412 Radiculopathy, cervical region: Secondary | ICD-10-CM | POA: Diagnosis not present

## 2021-06-19 DIAGNOSIS — M9901 Segmental and somatic dysfunction of cervical region: Secondary | ICD-10-CM | POA: Diagnosis not present

## 2021-06-24 ENCOUNTER — Other Ambulatory Visit: Payer: Self-pay

## 2021-06-24 DIAGNOSIS — R768 Other specified abnormal immunological findings in serum: Secondary | ICD-10-CM | POA: Diagnosis not present

## 2021-06-24 DIAGNOSIS — Z7960 Long term (current) use of unspecified immunomodulators and immunosuppressants: Secondary | ICD-10-CM | POA: Diagnosis not present

## 2021-06-24 DIAGNOSIS — M313 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: Secondary | ICD-10-CM | POA: Diagnosis not present

## 2021-06-24 DIAGNOSIS — M5412 Radiculopathy, cervical region: Secondary | ICD-10-CM | POA: Diagnosis not present

## 2021-06-24 DIAGNOSIS — M9903 Segmental and somatic dysfunction of lumbar region: Secondary | ICD-10-CM | POA: Diagnosis not present

## 2021-06-24 DIAGNOSIS — M7918 Myalgia, other site: Secondary | ICD-10-CM | POA: Diagnosis not present

## 2021-06-24 DIAGNOSIS — M5416 Radiculopathy, lumbar region: Secondary | ICD-10-CM | POA: Diagnosis not present

## 2021-06-24 DIAGNOSIS — G2581 Restless legs syndrome: Secondary | ICD-10-CM | POA: Diagnosis not present

## 2021-06-24 DIAGNOSIS — M9904 Segmental and somatic dysfunction of sacral region: Secondary | ICD-10-CM | POA: Diagnosis not present

## 2021-06-24 DIAGNOSIS — M9901 Segmental and somatic dysfunction of cervical region: Secondary | ICD-10-CM | POA: Diagnosis not present

## 2021-06-24 MED ORDER — GABAPENTIN 100 MG PO CAPS
100.0000 mg | ORAL_CAPSULE | Freq: Every day | ORAL | 5 refills | Status: DC
  Filled 2021-06-24: qty 30, 30d supply, fill #0
  Filled 2021-08-07: qty 30, 30d supply, fill #1

## 2021-06-25 DIAGNOSIS — M9904 Segmental and somatic dysfunction of sacral region: Secondary | ICD-10-CM | POA: Diagnosis not present

## 2021-06-25 DIAGNOSIS — M7918 Myalgia, other site: Secondary | ICD-10-CM | POA: Diagnosis not present

## 2021-06-25 DIAGNOSIS — M9903 Segmental and somatic dysfunction of lumbar region: Secondary | ICD-10-CM | POA: Diagnosis not present

## 2021-06-25 DIAGNOSIS — M5412 Radiculopathy, cervical region: Secondary | ICD-10-CM | POA: Diagnosis not present

## 2021-06-25 DIAGNOSIS — M9901 Segmental and somatic dysfunction of cervical region: Secondary | ICD-10-CM | POA: Diagnosis not present

## 2021-06-25 DIAGNOSIS — M5416 Radiculopathy, lumbar region: Secondary | ICD-10-CM | POA: Diagnosis not present

## 2021-06-26 DIAGNOSIS — M5416 Radiculopathy, lumbar region: Secondary | ICD-10-CM | POA: Diagnosis not present

## 2021-06-26 DIAGNOSIS — M9901 Segmental and somatic dysfunction of cervical region: Secondary | ICD-10-CM | POA: Diagnosis not present

## 2021-06-26 DIAGNOSIS — M9904 Segmental and somatic dysfunction of sacral region: Secondary | ICD-10-CM | POA: Diagnosis not present

## 2021-06-26 DIAGNOSIS — M7918 Myalgia, other site: Secondary | ICD-10-CM | POA: Diagnosis not present

## 2021-06-26 DIAGNOSIS — M5412 Radiculopathy, cervical region: Secondary | ICD-10-CM | POA: Diagnosis not present

## 2021-06-26 DIAGNOSIS — M9903 Segmental and somatic dysfunction of lumbar region: Secondary | ICD-10-CM | POA: Diagnosis not present

## 2021-06-30 ENCOUNTER — Telehealth: Payer: 59 | Admitting: Physician Assistant

## 2021-06-30 DIAGNOSIS — T148XXA Other injury of unspecified body region, initial encounter: Secondary | ICD-10-CM | POA: Diagnosis not present

## 2021-06-30 NOTE — Progress Notes (Signed)
E Visit for Rash  We are sorry that you are not feeling well. Here is how we plan to help!  I have prescribed:  Topical mupiricin   This is an antibiotic cream you can apply to the affected area twice daily   HOME CARE:  Take cool showers and avoid direct sunlight. Apply cool compress or wet dressings. Take a bath in an oatmeal bath.  Sprinkle content of one Aveeno packet under running faucet with comfortably warm water.  Bathe for 15-20 minutes, 1-2 times daily.  Pat dry with a towel. Do not rub the rash. Use hydrocortisone cream. Take an antihistamine like Benadryl for widespread rashes that itch.  The adult dose of Benadryl is 25-50 mg by mouth 4 times daily. Caution:  This type of medication may cause sleepiness.  Do not drink alcohol, drive, or operate dangerous machinery while taking antihistamines.  Do not take these medications if you have prostate enlargement.  Read package instructions thoroughly on all medications that you take.  GET HELP RIGHT AWAY IF:  Symptoms don't go away after treatment. Severe itching that persists. If you rash spreads or swells. If you rash begins to smell. If it blisters and opens or develops a yellow-brown crust. You develop a fever. You have a sore throat. You become short of breath.  MAKE SURE YOU:  Understand these instructions. Will watch your condition. Will get help right away if you are not doing well or get worse.  Thank you for choosing an e-visit.  Your e-visit answers were reviewed by a board certified advanced clinical practitioner to complete your personal care plan. Depending upon the condition, your plan could have included both over the counter or prescription medications.  Please review your pharmacy choice. Make sure the pharmacy is open so you can pick up prescription now. If there is a problem, you may contact your provider through [employer] and have the prescription routed to another pharmacy.  Your safety is  important to [place]. If you have drug allergies check your prescription carefully.   For the next 24 hours you can use MyChart to ask questions about today's visit, request a non-urgent call back, or ask for a work or school excuse. You will get an email in the next two days asking about your experience. I hope that your e-visit has been valuable and will speed your recovery.  I provided 5 minutes of non face-to-face time during this encounter for chart review and documentation.

## 2021-07-21 ENCOUNTER — Ambulatory Visit: Payer: 59 | Admitting: Adult Health

## 2021-08-07 ENCOUNTER — Other Ambulatory Visit: Payer: Self-pay

## 2021-08-07 MED ORDER — METHOTREXATE 2.5 MG PO TABS
ORAL_TABLET | ORAL | 1 refills | Status: DC
  Filled 2021-08-07: qty 96, 84d supply, fill #0
  Filled 2021-11-26: qty 96, 84d supply, fill #1

## 2021-08-14 NOTE — Telephone Encounter (Signed)
Erroneous encounter. Please disregard.

## 2021-08-20 ENCOUNTER — Other Ambulatory Visit: Payer: Self-pay

## 2021-08-20 ENCOUNTER — Ambulatory Visit (INDEPENDENT_AMBULATORY_CARE_PROVIDER_SITE_OTHER): Payer: 59

## 2021-08-20 ENCOUNTER — Ambulatory Visit
Admission: EM | Admit: 2021-08-20 | Discharge: 2021-08-20 | Disposition: A | Discharge status: Home / Self Care | Payer: 59 | Attending: Emergency Medicine | Admitting: Emergency Medicine

## 2021-08-20 DIAGNOSIS — M25532 Pain in left wrist: Secondary | ICD-10-CM

## 2021-08-20 DIAGNOSIS — M7989 Other specified soft tissue disorders: Secondary | ICD-10-CM | POA: Diagnosis not present

## 2021-08-20 MED ORDER — DEXAMETHASONE SODIUM PHOSPHATE 10 MG/ML IJ SOLN
10.0000 mg | Freq: Once | INTRAMUSCULAR | Status: AC
  Administered 2021-08-20: 10 mg via INTRAMUSCULAR

## 2021-08-20 MED ORDER — FOLIC ACID 1 MG PO TABS
ORAL_TABLET | ORAL | 3 refills | Status: DC
  Filled 2021-08-20: qty 90, 90d supply, fill #0
  Filled 2021-12-08: qty 29, 29d supply, fill #1
  Filled 2021-12-08: qty 61, 61d supply, fill #1
  Filled 2022-03-13: qty 90, 90d supply, fill #2
  Filled 2022-06-15: qty 90, 90d supply, fill #3

## 2021-08-20 MED ORDER — PREDNISONE 10 MG PO TABS
ORAL_TABLET | ORAL | 0 refills | Status: DC
  Filled 2021-08-20: qty 21, 6d supply, fill #0

## 2021-08-20 NOTE — ED Triage Notes (Signed)
Pt here with C/O left hand swelling, no known injury or bite. States it happened a couple weeks ago and resolved itself. Pt states it hurts worst this time. Denies HX of gout.

## 2021-08-20 NOTE — ED Provider Notes (Signed)
[facility]    CSN: [id number] Arrival date & time: 08/20/21  0855      History   Chief Complaint Chief Complaint  Patient presents with   Hand Pain    left    HPI [name] is a 51 y.o. female.   HPI  50 year old female here for evaluation of left hand pain and swelling.  Patient reports that her current symptoms started last night and woke her up from sleep at about 11 PM.  She has not been able to go back to sleep since then and describes a constant throbbing.  She states the pain is worse on both sides of her hand, over the carpal bones, over the thumb side of the palm, and in the left and right side of the wrist.  Patient does have a history of existing numbness in her fingers due to an old whiplash injury.  She had a similar episode proximately month ago that took about a week to resolve.  She states that she treated it with warm soaks and massage therapy.  She denies any injury.  Patient denies any history of gout but she does have a history of a carpal tunnel release on the right and Wegener's granulomatosis with renal involvement.  She is not on any calcium channel blockers or diabetes medication.  Patient is currently taking methotrexate for her Wegener's granulomatosis.  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[place])    [facility]---  dx 2015,  takes methotrexate wkly, pt stated she is stable    Patient Active Problem List   Diagnosis Date Noted   Cystocele with prolapse 01/14/2021    Cyst of left ovary 09/14/2020   Female bladder prolapse- likely.  09/13/2020   Cervix prolapsed into vagina 09/13/2020   Dysuria 09/13/2020   Genetic testing 09/10/2020   Anxiety 06/18/2020   Mild intermittent asthma without complication 35/32/9924   Bloody diarrhea 06/09/2016   Major depressive disorder, recurrent, severe without psychotic features ([place])    Severe recurrent major depression w/psychotic features, mood-congruent ([place]) 05/04/2014   Major depression 05/04/2014   Positive ANA (antinuclear antibody) 04/10/2014   Granulomatosis with polyangiitis with pulmonary involvement ([place]) 02/07/2014   Generalized anxiety disorder 02/07/2014   Insomnia 02/07/2014   Sensorineural hearing loss of right ear 12/06/2013   Dermatitis 12/05/2013   Nasal septal perforation 12/05/2013   Allergic rhinitis 10/12/2006   Asthma 10/12/2006   GERD 10/12/2006    Past Surgical History:  Procedure Laterality Date   ANTERIOR AND POSTERIOR REPAIR N/A 01/14/2021   Procedure: ANTERIOR (CYSTOCELE) AND POSTERIOR REPAIR (RECTOCELE);  Surgeon: Cheri Fowler, MD;  Location: [facility];  Service: Gynecology;  Laterality: N/A;   BREAST ENHANCEMENT SURGERY  2009   CARPAL TUNNEL RELEASE Right 2011   LAPAROSCOPIC SUPRACERVICAL HYSTERECTOMY  02/21/2010   @ [place] by Dr Willis Modena   NASAL SEPTUM SURGERY  01/2018   insertion button prosthesis for septal perforation   RHINOPLASTY  2005   nasal fracture    OB History   No obstetric history on file.      Home Medications    Prior to Admission medications   Medication Sig Start Date End Date Taking? Authorizing Provider  ADVAIR DISKUS 100-50 MCG/DOSE AEPB INHALE 1 PUFF INTO THE LUNGS TWICE DAILY 07/26/17  Yes [name], MD  albuterol (VENTOLIN HFA) 108 (90 Base) MCG/ACT inhaler Inhale 2 puffs into the lungs every 6 (six) hours as needed for wheezing or shortness of breath (cough, shortness of breath or wheezing.). 06/24/20  Yes Flinchum,  Kelby Aline, FNP  gabapentin (NEURONTIN) 100 MG capsule Take 1 capsule (100 mg total) by mouth at bedtime 06/24/21  Yes   hydrOXYzine (VISTARIL) 25 MG capsule Take 1 capsule (25 mg total) by mouth daily as needed. 01/16/21  Yes McLean-Scocuzza, Nino Glow, MD  ibuprofen (ADVIL) 600 MG tablet Take 1 tablet (600 mg total) by mouth every 6 (six) hours. 01/15/21  Yes Meisinger, Sherren Mocha, MD  methotrexate (RHEUMATREX) 2.5 MG tablet Take 8 tablets (20 mg total) by mouth every 7 (seven) days 08/07/21  Yes   montelukast (SINGULAIR) 10 MG tablet Take 1 tablet (10 mg total) by mouth daily. 03/11/21  Yes Flinchum, Kelby Aline, FNP  pantoprazole (PROTONIX) 40 MG tablet TAKE ONE TABLET BY MOUTH TWICE A DAY BEFORE FOOD 09/19/20  Yes   predniSONE (STERAPRED UNI-PAK 21 TAB) 10 MG (21) TBPK tablet Take 6 tablets on day 1, 5 tablets day 2, 4 tablets day 3, 3 tablets day 4, 2 tablets day 5, 1 tablet day 6 08/20/21  Yes Margarette Canada, NP  sertraline (ZOLOFT) 100 MG tablet Take 1.5 tablets (150 mg total) by mouth daily. 01/16/21  Yes McLean-Scocuzza, Nino Glow, MD    Family History Family History  Problem Relation Age of Onset   Asthma Mother    Depression Mother    Diabetes Mother    Hyperlipidemia Mother    Hypertension Mother    Mental illness Mother    Arthritis Mother    Pancreatic cancer Mother    Heart disease Father 73       AMI/CABG   Mental illness Sister    Alzheimer's disease Maternal Grandmother    Hyperlipidemia Maternal Grandmother    Hypertension Maternal Grandmother    Heart attack Maternal Grandfather    Lung cancer Maternal Grandfather    Myasthenia gravis Daughter    Charcot-Marie-Tooth disease Daughter    ADD / ADHD Daughter    Pancreatic cancer Maternal Aunt     Social History Social History   Tobacco Use   Smoking status: Never   Smokeless tobacco: Never  Vaping Use   Vaping Use: Never used  Substance Use Topics   Alcohol use: Yes    Comment: occasional   Drug use: Never      Allergies   Kiwi extract, Ativan [lorazepam], Erythromycin base, Nitrofurantoin, and Xanax [alprazolam]   Review of Systems Review of Systems  Constitutional:  Negative for fever.  Musculoskeletal:  Positive for arthralgias, joint swelling and myalgias.  Skin:  Negative for color change.  Neurological:  Positive for weakness and numbness.       Patient is weakness in her left hand secondary to pain.  Hematological: Negative.   Psychiatric/Behavioral: Negative.      Physical Exam Triage Vital Signs ED Triage Vitals  Enc Vitals Group     BP 08/20/21 1010 114/69     Pulse Rate 08/20/21 1010 82  Resp 08/20/21 1010 18     Temp 08/20/21 1010 98.2 F (36.8 C)     Temp Source 08/20/21 1010 Oral     SpO2 08/20/21 1010 98 %     Weight 08/20/21 1008 165 lb (74.8 kg)     Height 08/20/21 1008 5\' 3"  (1.6 m)     Head Circumference --      Peak Flow --      Pain Score 08/20/21 1008 7     Pain Loc --      Pain Edu? --      Excl. in [place]? --    No data found.  Updated Vital Signs BP 114/69 (BP Location: Right Arm)    Pulse 82    Temp 98.2 F (36.8 C) (Oral)    Resp 18    Ht 5\' 3"  (1.6 m)    Wt 165 lb (74.8 kg)    SpO2 98%    BMI 29.23 kg/m   Visual Acuity Right Eye Distance:   Left Eye Distance:   Bilateral Distance:    Right Eye Near:   Left Eye Near:    Bilateral Near:     Physical Exam Vitals and nursing note reviewed.  Constitutional:      General: She is not in acute distress.    Appearance: Normal appearance. She is not ill-appearing.  HENT:     Head: Normocephalic and atraumatic.  Musculoskeletal:        General: Swelling and tenderness present. No deformity or signs of injury.  Skin:    General: Skin is warm and dry.     Capillary Refill: Capillary refill takes less than 2 seconds.     Findings: No erythema or rash.  Neurological:     General: No focal deficit present.     Mental Status: She is alert and oriented to person, place, and time.      Sensory: No sensory deficit.     Motor: No weakness.  Psychiatric:        Mood and Affect: Mood normal.        Behavior: Behavior normal.        Thought Content: Thought content normal.        Judgment: Judgment normal.     UC Treatments / Results  Labs (all labs ordered are listed, but only abnormal results are displayed) Labs Reviewed - No data to display  EKG   Radiology DG Wrist Complete Left  Result Date: 08/20/2021 CLINICAL DATA:  Intermittent left wrist pain and swelling. No injury. EXAM: LEFT WRIST - COMPLETE 3+ VIEW COMPARISON:  None. FINDINGS: There is no evidence of fracture or dislocation. There is no evidence of arthropathy or other focal bone abnormality. No chondrocalcinosis. Diffuse soft tissue swelling. IMPRESSION: 1. Diffuse soft tissue swelling. No acute osseous abnormality. Electronically Signed   By: Titus Dubin M.D.   On: 08/20/2021 11:47    Procedures Procedures (including critical care time)  Medications Ordered in UC Medications  dexamethasone (DECADRON) injection 10 mg (has no administration in time range)    Initial Impression / Assessment and Plan / UC Course  I have reviewed the triage vital signs and the nursing notes.  Pertinent labs & imaging results that were available during my care of the patient were reviewed by me and considered in my medical decision making (see chart for details).  Nontoxic appearing 51 year old female here for evaluation of pain and swelling to her left hand that started last night abruptly at  11:00 and awoke her from sleep.  She did have a similar episode 1 month ago that lasted approximately a week but did spontaneous resolved after using soaks and massage therapy.  She states that the main pain is on both sides of her wrist and in her carpal bones.  She does have a history of carpal tunnel syndrome with carpal tunnel release on the right and baseline numbness in her left hand as a result of an old whiplash injury  years ago.  She states that when she had a similar episode a month ago that she had similar symptoms to include swelling and warmth in her wrist.  No injury associated with either episode.  On exam patient does have edema to the dorsum of her hand and the thenar aspect of the palm.  There is no overlying erythema but the area is hot and tender to touch.  Patient has full sensation in her fingers and states that they are normal when compared to the right hand.  She does have decreased grip strength in the left hand but states it secondary to pain.  She does have tenderness when palpating along the first metacarpal laterally and the fifth metacarpal laterally, compression of the radial ulnar styloid, and palpation of the carpal bones.  She does report increased pain with resisted flexion extension of the thumb but no pain with axial loading of the thumb or passive flexion across the palm.  She does have markedly increased pain with flexion and extension of the wrist as well as supination of the wrist.  The pain is not as severe with radial and ulnar deviation.  Suspect patient either has gout or possible reactive arthritis.  Will obtain radiograph to look for any bony anomaly or ostial inflammation.  Left wrist films independently reviewed and evaluated by me.  Impression: No evidence of fracture or dislocation.  There is soft tissue swelling present.  Radiology overread is pending. Radiology impression is diffuse soft tissue swelling but no acute osseous abnormality.  Patient symptoms can still be gout versus reactive arthritis.  We will start patient on a prednisone pack to see if this improves her symptoms.  She can continue doing moist soaks and using Tylenol at home.  We will give a dose of Decadron 10 mg here to jumpstart her and she can start the prednisone back tomorrow   Final Clinical Impressions(s) / UC Diagnoses   Final diagnoses:  Left wrist pain     Discharge Instructions      Your  x-rays did not show any bony abnormality but it did show diffuse soft tissue swelling.  Your symptoms may still be reactive arthritis versus gout.  Start the prednisone pack tomorrow and take it each morning with breakfast.  You will take it on a tapering dose over the next 6 days.  You may continue to use over-the-counter Tylenol Corte to the package instructions as needed for pain at home.  You may also continue to soak your wrist in warm water and Epsom salts as needed.  If you have any increase in swelling, pain, redness, recent running fevers please go to either [facility] or the emergency department for evaluation.     ED Prescriptions     Medication Sig Dispense Auth. Provider   predniSONE (STERAPRED UNI-PAK 21 TAB) 10 MG (21) TBPK tablet Take 6 tablets on day 1, 5 tablets day 2, 4 tablets day 3, 3 tablets day 4, 2 tablets day 5, 1 tablet day 6 21  tablet Margarette Canada, NP      PDMP not reviewed this encounter.   Margarette Canada, NP 08/20/21 1221

## 2021-08-20 NOTE — Discharge Instructions (Addendum)
Your x-rays did not show any bony abnormality but it did show diffuse soft tissue swelling.  Your symptoms may still be reactive arthritis versus gout.  Start the prednisone pack tomorrow and take it each morning with breakfast.  You will take it on a tapering dose over the next 6 days.  You may continue to use over-the-counter Tylenol Corte to the package instructions as needed for pain at home.  You may also continue to soak your wrist in warm water and Epsom salts as needed.  If you have any increase in swelling, pain, redness, recent running fevers please go to either [facility] or the emergency department for evaluation.

## 2021-08-21 ENCOUNTER — Telehealth: Payer: 59 | Admitting: Family Medicine

## 2021-08-21 DIAGNOSIS — J329 Chronic sinusitis, unspecified: Secondary | ICD-10-CM

## 2021-08-21 DIAGNOSIS — B9789 Other viral agents as the cause of diseases classified elsewhere: Secondary | ICD-10-CM

## 2021-08-21 NOTE — Progress Notes (Signed)
   OUT of STATE  Needs to be seen local in [place]

## 2021-09-12 ENCOUNTER — Other Ambulatory Visit: Payer: Self-pay

## 2021-09-17 ENCOUNTER — Inpatient Hospital Stay
Admission: RE | Admit: 2021-09-17 | Discharge: 2021-09-17 | Disposition: A | Discharge status: Home / Self Care | Payer: Self-pay | Source: Ambulatory Visit | Attending: *Deleted | Admitting: *Deleted

## 2021-09-17 ENCOUNTER — Other Ambulatory Visit: Payer: Self-pay | Admitting: *Deleted

## 2021-09-17 DIAGNOSIS — Z1231 Encounter for screening mammogram for malignant neoplasm of breast: Secondary | ICD-10-CM

## 2021-09-18 ENCOUNTER — Other Ambulatory Visit: Payer: Self-pay | Admitting: Internal Medicine

## 2021-09-18 ENCOUNTER — Other Ambulatory Visit: Payer: Self-pay | Admitting: Obstetrics and Gynecology

## 2021-09-19 ENCOUNTER — Other Ambulatory Visit: Payer: Self-pay | Admitting: Obstetrics and Gynecology

## 2021-09-19 DIAGNOSIS — R921 Mammographic calcification found on diagnostic imaging of breast: Secondary | ICD-10-CM

## 2021-10-04 DIAGNOSIS — C801 Malignant (primary) neoplasm, unspecified: Secondary | ICD-10-CM

## 2021-10-04 HISTORY — DX: Malignant (primary) neoplasm, unspecified: C80.1

## 2021-10-06 ENCOUNTER — Ambulatory Visit
Admission: RE | Admit: 2021-10-06 | Discharge: 2021-10-06 | Disposition: A | Discharge status: Home / Self Care | Payer: 59 | Source: Ambulatory Visit | Attending: Obstetrics and Gynecology | Admitting: Obstetrics and Gynecology

## 2021-10-06 DIAGNOSIS — R921 Mammographic calcification found on diagnostic imaging of breast: Secondary | ICD-10-CM

## 2021-10-06 DIAGNOSIS — R922 Inconclusive mammogram: Secondary | ICD-10-CM | POA: Diagnosis not present

## 2021-10-07 ENCOUNTER — Other Ambulatory Visit: Payer: Self-pay | Admitting: Obstetrics and Gynecology

## 2021-10-07 ENCOUNTER — Other Ambulatory Visit: Payer: Self-pay | Admitting: Family

## 2021-10-08 ENCOUNTER — Other Ambulatory Visit: Payer: Self-pay | Admitting: Obstetrics and Gynecology

## 2021-10-08 DIAGNOSIS — R921 Mammographic calcification found on diagnostic imaging of breast: Secondary | ICD-10-CM

## 2021-10-08 DIAGNOSIS — R928 Other abnormal and inconclusive findings on diagnostic imaging of breast: Secondary | ICD-10-CM

## 2021-10-23 ENCOUNTER — Ambulatory Visit
Admission: RE | Admit: 2021-10-23 | Discharge: 2021-10-23 | Disposition: A | Discharge status: Home / Self Care | Payer: 59 | Source: Ambulatory Visit | Attending: Obstetrics and Gynecology | Admitting: Obstetrics and Gynecology

## 2021-10-23 ENCOUNTER — Other Ambulatory Visit: Payer: Self-pay

## 2021-10-23 DIAGNOSIS — G2581 Restless legs syndrome: Secondary | ICD-10-CM | POA: Diagnosis not present

## 2021-10-23 DIAGNOSIS — R921 Mammographic calcification found on diagnostic imaging of breast: Secondary | ICD-10-CM | POA: Diagnosis not present

## 2021-10-23 DIAGNOSIS — M313 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: Secondary | ICD-10-CM | POA: Diagnosis not present

## 2021-10-23 DIAGNOSIS — R928 Other abnormal and inconclusive findings on diagnostic imaging of breast: Secondary | ICD-10-CM | POA: Diagnosis not present

## 2021-10-23 DIAGNOSIS — C50412 Malignant neoplasm of upper-outer quadrant of left female breast: Secondary | ICD-10-CM | POA: Diagnosis not present

## 2021-10-23 DIAGNOSIS — M19032 Primary osteoarthritis, left wrist: Secondary | ICD-10-CM | POA: Diagnosis not present

## 2021-10-23 DIAGNOSIS — R920 Mammographic microcalcification found on diagnostic imaging of breast: Secondary | ICD-10-CM | POA: Diagnosis not present

## 2021-10-23 DIAGNOSIS — Z7960 Long term (current) use of unspecified immunomodulators and immunosuppressants: Secondary | ICD-10-CM | POA: Diagnosis not present

## 2021-10-23 HISTORY — PX: BREAST BIOPSY: SHX20

## 2021-10-23 MED ORDER — GABAPENTIN 300 MG PO CAPS
ORAL_CAPSULE | ORAL | 5 refills | Status: DC
  Filled 2021-10-23: qty 30, 30d supply, fill #0
  Filled 2021-11-26: qty 30, 30d supply, fill #1
  Filled 2021-12-21 – 2022-02-06 (×2): qty 30, 30d supply, fill #2
  Filled 2022-04-24: qty 30, 30d supply, fill #3
  Filled 2022-06-02: qty 30, 30d supply, fill #4
  Filled 2022-07-03: qty 30, 30d supply, fill #5

## 2021-10-23 MED ORDER — HYDROXYCHLOROQUINE SULFATE 200 MG PO TABS
200.0000 mg | ORAL_TABLET | Freq: Two times a day (BID) | ORAL | 5 refills | Status: DC
  Filled 2021-10-23: qty 60, 30d supply, fill #0
  Filled 2021-12-08: qty 7, 3d supply, fill #1
  Filled 2021-12-08: qty 53, 27d supply, fill #1
  Filled 2022-01-27: qty 50, 25d supply, fill #2
  Filled 2022-01-27: qty 10, 5d supply, fill #2
  Filled 2022-03-25 – 2022-05-03 (×2): qty 60, 30d supply, fill #3
  Filled 2022-06-02: qty 60, 30d supply, fill #4
  Filled 2022-07-03: qty 60, 30d supply, fill #5

## 2021-10-27 DIAGNOSIS — C50919 Malignant neoplasm of unspecified site of unspecified female breast: Secondary | ICD-10-CM

## 2021-10-27 DIAGNOSIS — D0512 Intraductal carcinoma in situ of left breast: Secondary | ICD-10-CM

## 2021-10-27 LAB — SURGICAL PATHOLOGY

## 2021-10-27 NOTE — Progress Notes (Signed)
Navigation initiated .   Scheduling Med/Onc and Surgical consults.   ?

## 2021-10-28 ENCOUNTER — Other Ambulatory Visit: Payer: Self-pay | Admitting: Rheumatology

## 2021-10-28 DIAGNOSIS — M313 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: Secondary | ICD-10-CM

## 2021-10-31 DIAGNOSIS — D0512 Intraductal carcinoma in situ of left breast: Secondary | ICD-10-CM | POA: Insufficient documentation

## 2021-10-31 NOTE — Progress Notes (Signed)
?[place]  ?[phone] [phone] ? ?ID: [name] OB: 1971/02/25  MR#: [id number]  OZY#:[id number] ? ?Patient Care Team: ?Mckinley Jewel, MD as PCP - General (Internal Medicine) ? 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 ? ?INTERVAL HISTORY: Patient is a 51 year old female who reports she had an area on her left breast that was being closely monitored over the past several years and finally underwent biopsy revealing the above-stated lesion.  She is scheduled for lumpectomy next week.  She currently feels well and is asymptomatic.  She has no neurologic complaints.  She denies any recent fevers or illnesses.  She has a good appetite and denies weight loss.  She has no chest pain, shortness of breath, cough, or hemoptysis.  She denies any nausea, vomiting, constipation, or diarrhea.  She has no urinary complaints.  Patient feels at her baseline offers no specific complaints today. ? ?REVIEW OF SYSTEMS:   ?Review of Systems  ?Constitutional: Negative.  Negative for fever, malaise/fatigue and weight loss.  ?Respiratory: Negative.  Negative for cough, hemoptysis and shortness of breath.   ?Cardiovascular:  Negative for chest pain and leg swelling.  ?Gastrointestinal: Negative.  Negative for abdominal pain.  ?Genitourinary: Negative.  Negative for dysuria.  ?Musculoskeletal: Negative.  Negative for back pain.  ?Skin: Negative.  Negative for rash.  ?Neurological: Negative.  Negative for dizziness, focal weakness, weakness and headaches.  ?Psychiatric/Behavioral: Negative.  The patient is not nervous/anxious.   ? ?As per HPI. Otherwise, a complete review of systems is negative. ? ?PAST MEDICAL HISTORY: ?Past Medical History:  ?Diagnosis Date  ? Asthma   ? followed by pcp---  pt stated mild seasonal asthma  ? Cancer ([place]) 10/2021  ? left breast DCIS  ? Female cystocele   ? symptomatic  ? GAD (generalized anxiety disorder)   ? GERD  (gastroesophageal reflux disease)   ? Hiatal hernia   ? History of 2019 novel coronavirus disease (COVID-19) 07/2020  ? per pt had mild symptoms that resolved  ? MDD (major depressive disorder)   ? Mixed incontinence urge and stress   ? Wears glasses   ? 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[place])   ? [facility]---  dx 2015,  takes methotrexate wkly, pt stated she is stable  ? ? ?PAST SURGICAL HISTORY: ?Past Surgical History:  ?Procedure Laterality Date  ? ANTERIOR AND POSTERIOR REPAIR N/A 01/14/2021  ? Procedure: ANTERIOR (CYSTOCELE) AND POSTERIOR REPAIR (RECTOCELE);  Surgeon: Cheri Fowler, MD;  Location: [facility];  Service: Gynecology;  Laterality: N/A;  ? AUGMENTATION MAMMAPLASTY Bilateral 2010  ? saline  ? BREAST BIOPSY Left 10/23/2021  ? Stereo Bx, X-clip, path pending  ? BREAST ENHANCEMENT SURGERY  2009  ? CARPAL TUNNEL RELEASE Right 2011  ? LAPAROSCOPIC SUPRACERVICAL HYSTERECTOMY  02/21/2010  ? @ [place] by Dr Willis Modena  ? NASAL SEPTUM SURGERY  01/2018  ? insertion button prosthesis for septal perforation  ? RHINOPLASTY  2005  ? nasal fracture  ? ? ?FAMILY HISTORY: ?Family History  ?Problem Relation Age of Onset  ? Asthma Mother   ? Depression Mother   ? Diabetes Mother   ? Hyperlipidemia Mother   ? Hypertension Mother   ? Mental illness Mother   ? Arthritis Mother   ? Pancreatic cancer Mother   ? Heart disease Father 64  ?     AMI/CABG  ? Mental illness Sister   ? Alzheimer's disease Maternal Grandmother   ?  Hyperlipidemia Maternal Grandmother   ? Hypertension Maternal Grandmother   ? Heart attack Maternal Grandfather   ? Lung cancer Maternal Grandfather   ? Myasthenia gravis Daughter   ? Charcot-Marie-Tooth disease Daughter   ? ADD / ADHD Daughter   ? Pancreatic cancer Maternal Aunt   ? ? ?ADVANCED DIRECTIVES (Y/N):  N ? ?HEALTH MAINTENANCE: ?Social History  ? ?Tobacco Use  ? Smoking status: Never  ? Smokeless tobacco: Never  ?Vaping Use  ? Vaping  Use: Never used  ?Substance Use Topics  ? Alcohol use: Yes  ?  Comment: occasional  ? Drug use: Never  ? ? ? Colonoscopy: ? PAP: ? Bone density: ? Lipid panel: ? ?Allergies  ?Allergen Reactions  ? Kiwi Extract Shortness Of Breath and Swelling  ? Ativan [Lorazepam] Other (See Comments)  ?  Hyperactivity per patient  ? Erythromycin Base Nausea And Vomiting  ? Nitrofurantoin Nausea And Vomiting  ? Xanax [Alprazolam] Other (See Comments)  ?  Makes patient hyperactive   ? ? ?Current Outpatient Medications  ?Medication Sig Dispense Refill  ? folic acid (FOLVITE) 1 MG tablet Take 1 tablet (1 mg total) by mouth once daily 90 tablet 3  ? gabapentin (NEURONTIN) 300 MG capsule Take 1 capsule (300 mg total) by mouth at bedtime 30 capsule 5  ? hydroxychloroquine (PLAQUENIL) 200 MG tablet Take 1 tablet (200 mg total) by mouth 2 (two) times daily (Patient taking differently: Take 200 mg by mouth daily.) 60 tablet 5  ? hydrOXYzine (VISTARIL) 25 MG capsule Take 1 capsule (25 mg total) by mouth daily as needed. 90 capsule 3  ? methotrexate (RHEUMATREX) 2.5 MG tablet Take 8 tablets (20 mg total) by mouth every 7 (seven) days 96 tablet 1  ? montelukast (SINGULAIR) 10 MG tablet Take 1 tablet (10 mg total) by mouth daily. 30 tablet 6  ? pantoprazole (PROTONIX) 40 MG tablet TAKE ONE TABLET BY MOUTH TWICE A DAY BEFORE FOOD 180 tablet 3  ? sertraline (ZOLOFT) 100 MG tablet Take 1.5 tablets (150 mg total) by mouth daily. (Patient taking differently: Take 100 mg by mouth daily.) 140 tablet 3  ? sucralfate (CARAFATE) 1 g tablet as needed.    ? ADVAIR DISKUS 100-50 MCG/DOSE AEPB INHALE 1 PUFF INTO THE LUNGS TWICE DAILY (Patient not taking: Reported on 11/06/2021) 1 each 0  ? albuterol (VENTOLIN HFA) 108 (90 Base) MCG/ACT inhaler Inhale 2 puffs into the lungs every 6 (six) hours as needed for wheezing or shortness of breath (cough, shortness of breath or wheezing.). (Patient not taking: Reported on 11/06/2021) 1 each 1  ? ?No current  facility-administered medications for this visit.  ? ? ?OBJECTIVE: ?Vitals:  ? 11/06/21 1310  ?BP: 115/90  ?Pulse: 91  ?Temp: 97.6 ?F (36.4 ?C)  ?SpO2: 99%  ?   Body mass index is 30.82 kg/m?Marland Kitchen    ECOG FS:0 - Asymptomatic ? ?General: Well-developed, well-nourished, no acute distress. ?Eyes: Pink conjunctiva, anicteric sclera. ?HEENT: Normocephalic, moist mucous membranes. ?Breast: Exam deferred today. ?Lungs: No audible wheezing or coughing. ?Heart: Regular rate and rhythm. ?Abdomen: Soft, nontender, no obvious distention. ?Musculoskeletal: No edema, cyanosis, or clubbing. ?Neuro: Alert, answering all questions appropriately. Cranial nerves grossly intact. ?Skin: No rashes or petechiae noted. ?Psych: Normal affect. ?Lymphatics: No cervical, calvicular, axillary or inguinal LAD. ? ? ?LAB RESULTS: ? ?Lab Results  ?Component Value Date  ? NA 142 01/16/2021  ? K 4.2 01/16/2021  ? CL 108 01/16/2021  ? CO2 27 01/16/2021  ? GLUCOSE  76 01/16/2021  ? BUN 16 01/16/2021  ? CREATININE 0.71 01/16/2021  ? CALCIUM 8.5 01/16/2021  ? PROT 5.7 (L) 01/16/2021  ? ALBUMIN 3.7 01/16/2021  ? AST 14 01/16/2021  ? ALT 7 01/16/2021  ? ALKPHOS 50 01/16/2021  ? BILITOT 0.4 01/16/2021  ? GFRNONAA >60 06/20/2020  ? GFRAA 121 06/10/2016  ? ? ?Lab Results  ?Component Value Date  ? WBC 10.7 (H) 01/15/2021  ? NEUTROABS 3.1 06/20/2020  ? HGB 10.2 (L) 01/15/2021  ? HCT 32.0 (L) 01/15/2021  ? MCV 95.0 01/15/2021  ? PLT 225 01/15/2021  ? ? ? ?STUDIES: ?MM CLIP PLACEMENT LEFT ? ?Result Date: 10/23/2021 ?CLINICAL DATA:  Status post LEFT breast stereotactic guided biopsy EXAM: 3D DIAGNOSTIC LEFT MAMMOGRAM POST STEREOTACTIC BIOPSY COMPARISON:  Previous exam(s). FINDINGS: 3D Mammographic images were obtained following stereotactic guided biopsy of LEFT breast calcifications. The X shaped biopsy marking clip is displaced approximately 14 mm medially from residual calcifications. IMPRESSION: Mild medial displacement of the X shaped clip from site of residual  calcifications. Residual calcifications extend predominately inferiorly and anteriorly. Final Assessment: Post Procedure Mammograms for Marker Placement Electronically Signed   By: Valentino Saxon M.D.   On: 10/23/2021 13:3

## 2021-11-03 ENCOUNTER — Other Ambulatory Visit: Payer: Self-pay | Admitting: General Surgery

## 2021-11-03 DIAGNOSIS — C50412 Malignant neoplasm of upper-outer quadrant of left female breast: Secondary | ICD-10-CM | POA: Diagnosis not present

## 2021-11-03 DIAGNOSIS — Z170 Estrogen receptor positive status [ER+]: Secondary | ICD-10-CM

## 2021-11-03 DIAGNOSIS — Z9882 Breast implant status: Secondary | ICD-10-CM | POA: Diagnosis not present

## 2021-11-04 ENCOUNTER — Ambulatory Visit: Payer: 59 | Admitting: Oncology

## 2021-11-04 ENCOUNTER — Ambulatory Visit: Admission: RE | Admit: 2021-11-04 | Payer: 59 | Source: Ambulatory Visit

## 2021-11-04 ENCOUNTER — Other Ambulatory Visit: Payer: 59

## 2021-11-05 ENCOUNTER — Other Ambulatory Visit: Payer: Self-pay | Admitting: General Surgery

## 2021-11-05 ENCOUNTER — Encounter (HOSPITAL_BASED_OUTPATIENT_CLINIC_OR_DEPARTMENT_OTHER): Payer: Self-pay | Admitting: General Surgery

## 2021-11-05 ENCOUNTER — Other Ambulatory Visit: Payer: Self-pay

## 2021-11-05 DIAGNOSIS — C50412 Malignant neoplasm of upper-outer quadrant of left female breast: Secondary | ICD-10-CM

## 2021-11-06 ENCOUNTER — Other Ambulatory Visit: Payer: Self-pay | Admitting: *Deleted

## 2021-11-06 ENCOUNTER — Inpatient Hospital Stay: Payer: 59 | Attending: Oncology | Admitting: Oncology

## 2021-11-06 ENCOUNTER — Other Ambulatory Visit: Payer: Self-pay

## 2021-11-06 ENCOUNTER — Encounter: Payer: Self-pay | Admitting: Oncology

## 2021-11-06 ENCOUNTER — Inpatient Hospital Stay: Payer: 59

## 2021-11-06 VITALS — BP 115/90 | HR 91 | Temp 97.6°F | Ht 63.0 in | Wt 174.0 lb

## 2021-11-06 DIAGNOSIS — N3946 Mixed incontinence: Secondary | ICD-10-CM | POA: Diagnosis not present

## 2021-11-06 DIAGNOSIS — K449 Diaphragmatic hernia without obstruction or gangrene: Secondary | ICD-10-CM | POA: Diagnosis not present

## 2021-11-06 DIAGNOSIS — D0512 Intraductal carcinoma in situ of left breast: Secondary | ICD-10-CM | POA: Insufficient documentation

## 2021-11-06 DIAGNOSIS — Z170 Estrogen receptor positive status [ER+]: Secondary | ICD-10-CM | POA: Insufficient documentation

## 2021-11-06 DIAGNOSIS — C50412 Malignant neoplasm of upper-outer quadrant of left female breast: Secondary | ICD-10-CM | POA: Diagnosis not present

## 2021-11-06 DIAGNOSIS — M313 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: Secondary | ICD-10-CM | POA: Insufficient documentation

## 2021-11-06 DIAGNOSIS — Z800 Family history of malignant neoplasm of digestive organs: Secondary | ICD-10-CM | POA: Diagnosis not present

## 2021-11-06 DIAGNOSIS — Z8616 Personal history of COVID-19: Secondary | ICD-10-CM | POA: Diagnosis not present

## 2021-11-06 DIAGNOSIS — J45909 Unspecified asthma, uncomplicated: Secondary | ICD-10-CM | POA: Insufficient documentation

## 2021-11-06 DIAGNOSIS — K219 Gastro-esophageal reflux disease without esophagitis: Secondary | ICD-10-CM | POA: Diagnosis not present

## 2021-11-06 DIAGNOSIS — C50919 Malignant neoplasm of unspecified site of unspecified female breast: Secondary | ICD-10-CM

## 2021-11-06 DIAGNOSIS — Z79899 Other long term (current) drug therapy: Secondary | ICD-10-CM | POA: Insufficient documentation

## 2021-11-10 NOTE — Therapy (Signed)
?OUTPATIENT PHYSICAL THERAPY BREAST CANCER BASELINE EVALUATION ? ? ?Patient Name: [name] ?MRN: [id number] ?DOB:28-Feb-1971, 51 y.o., female ?Today's Date: 11/11/2021 ? ? PT End of Session - 11/11/21 0850   ? ? Visit Number 1   ? Number of Visits 2   ? Date for PT Re-Evaluation 12/09/21   ? PT Start Time 0805   ? PT Stop Time 0848   ? PT Time Calculation (min) 43 min   ? Activity Tolerance Patient tolerated treatment well   ? Behavior During Therapy [facility] for tasks assessed/performed   ? ?  ?  ? ?  ? ? ?Past Medical History:  ?Diagnosis Date  ? Asthma   ? followed by pcp---  pt stated mild seasonal asthma  ? Cancer ([place]) 10/2021  ? left breast DCIS  ? Female cystocele   ? symptomatic  ? GAD (generalized anxiety disorder)   ? GERD (gastroesophageal reflux disease)   ? Hiatal hernia   ? History of 2019 novel coronavirus disease (COVID-19) 07/2020  ? per pt had mild symptoms that resolved  ? MDD (major depressive disorder)   ? Mixed incontinence urge and stress   ? Wears glasses   ? 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 [facility]---  dx 2015,  takes methotrexate wkly, pt stated she is stable  ? ?Past Surgical History:  ?Procedure Laterality Date  ? ANTERIOR AND POSTERIOR REPAIR N/A 01/14/2021  ? Procedure: ANTERIOR (CYSTOCELE) AND POSTERIOR REPAIR (RECTOCELE);  Surgeon: Cheri Fowler, MD;  Location: [facility];  Service: Gynecology;  Laterality: N/A;  ? AUGMENTATION MAMMAPLASTY Bilateral 2010  ? saline  ? BREAST BIOPSY Left 10/23/2021  ? Stereo Bx, X-clip, path pending  ? BREAST ENHANCEMENT SURGERY  2009  ? CARPAL TUNNEL RELEASE Right 2011  ? LAPAROSCOPIC SUPRACERVICAL HYSTERECTOMY  02/21/2010  ? @ [facility] by Dr Willis Modena  ? NASAL SEPTUM SURGERY  01/2018  ? insertion button prosthesis for septal perforation  ? RHINOPLASTY  2005  ? nasal fracture  ? ?Patient Active Problem List  ? Diagnosis Date Noted  ? Ductal carcinoma in situ (DCIS) of left breast  10/31/2021  ? Cystocele with prolapse 01/14/2021  ? Cyst of left ovary 09/14/2020  ? Female bladder prolapse- likely.  09/13/2020  ? Cervix prolapsed into vagina 09/13/2020  ? Dysuria 09/13/2020  ? Genetic testing 09/10/2020  ? Anxiety 06/18/2020  ? Mild intermittent asthma without complication 40/98/1191  ? Bloody diarrhea 06/09/2016  ? Major depressive disorder, recurrent, severe without psychotic features ([place])   ? Severe recurrent major depression w/psychotic features, mood-congruent ([place]) 05/04/2014  ? Major depression 05/04/2014  ? Positive ANA (antinuclear antibody) 04/10/2014  ? Granulomatosis with polyangiitis with pulmonary involvement ([place]) 02/07/2014  ? Generalized anxiety disorder 02/07/2014  ? Insomnia 02/07/2014  ? Sensorineural hearing loss of right ear 12/06/2013  ? Dermatitis 12/05/2013  ? Nasal septal perforation 12/05/2013  ? Allergic rhinitis 10/12/2006  ? Asthma 10/12/2006  ? GERD 10/12/2006  ? ? ?PCP: [name], MD ? ?REFERRING PROVIDER: Stark Klein, MD  ? ?REFERRING DIAG: C50.919 (ICD-10-CM) - Invasive carcinoma of breast ([place])  ? ?THERAPY DIAG:  ?Abnormal posture ? ?Malignant neoplasm of left breast in female, estrogen receptor positive, unspecified site of breast ([place]) ? ?ONSET DATE: 10/23/21 ? ?SUBJECTIVE                                                                                                                                                                                          ? ?  SUBJECTIVE STATEMENT: ?Patient reports she is here today to be seen by her medical team for her newly diagnosed left breast cancer.  ? ?PERTINENT HISTORY:  ?Patient was diagnosed on 10/23/21 with left DCIS (grade to be determined later). It measures 1.2m and is located in the upper outer quadrant. It is ER/PR +, HER2 equivocal - pending final resection.  ? ?PATIENT GOALS   reduce lymphedema risk and learn post op HEP.  ? ?PAIN:  ?Are you having pain? No ? ? ?PRECAUTIONS: Active CA  ? ?HAND  DOMINANCE: right ? ?WEIGHT BEARING RESTRICTIONS No ? ?FALLS:  ?Has patient fallen in last 6 months? No ? ?LIVING ENVIRONMENT: ?Patient lives with: fiance ?Lives in: House/apartment ?Has following equipment at home: None ? ?OCCUPATION: full time, works in lab at [employer]? ?LEISURE: walks a couple miles every other day ? ?PRIOR LEVEL OF FUNCTION: Independent ? ? ?OBJECTIVE ? ?COGNITION: ? Overall cognitive status: Within functional limits for tasks assessed   ? ?POSTURE:  ?Forward head and rounded shoulders posture ? ?UPPER EXTREMITY AROM/PROM: ? ?A/PROM RIGHT  11/11/2021 ?  ?Shoulder extension 75  ?Shoulder flexion 169  ?Shoulder abduction 168  ?Shoulder internal rotation 62  ?Shoulder external rotation 88  ?  (Blank rows = not tested) ? ?A/PROM LEFT  11/11/2021  ?Shoulder extension 58  ?Shoulder flexion 172  ?Shoulder abduction 165  ?Shoulder internal rotation 70  ?Shoulder external rotation 88  ?  (Blank rows = not tested) ? ? ?CERVICAL AROM: ?All within normal limits:  ? ? Percent limited  ?Flexion WFL  ?Extension WFL  ?Right lateral flexion WFL  ?Left lateral flexion WFL  ?Right rotation WFL  ?Left rotation WFL  ? ? ? ?UPPER EXTREMITY STRENGTH: 5/5 ? ? ?LYMPHEDEMA ASSESSMENTS:  ? ?LBoltRIGHT  11/11/2021  ?10 cm proximal to olecranon process 28.5  ?Olecranon process 27  ?10 cm proximal to ulnar styloid process 20.5  ?Just proximal to ulnar styloid process 15.2  ?Across hand at thumb web space 19.5  ?At base of 2nd digit 6.6  ?(Blank rows = not tested) ? ?LRoscoeLEFT  11/11/2021  ?10 cm proximal to olecranon process 28.5  ?Olecranon process 26  ?10 cm proximal to ulnar styloid process 21.4  ?Just proximal to ulnar styloid process 15.3  ?Across hand at thumb web space 18  ?At base of 2nd digit 6.4  ?(Blank rows = not tested) ? ? ?L-DEX LYMPHEDEMA SCREENING: ? ?The patient was assessed using the L-Dex machine today to produce a lymphedema index baseline score. The patient will be reassessed on a regular basis  (typically every 3 months) to obtain new L-Dex scores. If the score is > 6.5 points away from his/her baseline score indicating onset of subclinical lymphedema, it will be recommended to wear a compression garment for 4 weeks, 12 hours per day and then be reassessed. If the score continues to be > 6.5 points from baseline at reassessment, we will initiate lymphedema treatment. Assessing in this manner has a 95% rate of preventing clinically significant lymphedema. ? ? L-DEX FLOWSHEETS - 11/11/21 0800   ? ?  ? L-DEX LYMPHEDEMA SCREENING  ? Measurement Type Unilateral   ? L-DEX MEASUREMENT EXTREMITY Upper Extremity   ? POSITION  Standing   ? DOMINANT SIDE Right   ? At Risk Side Left   ? BASELINE SCORE (UNILATERAL) 5.1   ? ?  ?  ? ?  ? ? ? ?QUICK DASH SURVEY: ? QKatina Dung- 11/11/21 0001   ? ?  Open a tight or new jar Mild difficulty   ? Do heavy household chores (wash walls, wash floors) No difficulty   ? Carry a shopping bag or briefcase No difficulty   ? Wash your back No difficulty   ? Use a knife to cut food No difficulty   ? Recreational activities in which you take some force or impact through your arm, shoulder, or hand (golf, hammering, tennis) No difficulty   ? During the past week, to what extent has your arm, shoulder or hand problem interfered with your normal social activities with family, friends, neighbors, or groups? Not at all   ? During the past week, to what extent has your arm, shoulder or hand problem limited your work or other regular daily activities Not at all   ? Arm, shoulder, or hand pain. Mild   ? Tingling (pins and needles) in your arm, shoulder, or hand Mild   ? Difficulty Sleeping Mild difficulty   ? DASH Score 9.09 %   ? ?  ?  ? ?  ? ? ? ? ?PATIENT EDUCATION:  ?Education details: Lymphedema risk reduction and post op shoulder/posture HEP ?Person educated: Patient ?Education method: Explanation, Demonstration, Handout ?Education comprehension: Patient verbalized understanding and returned  demonstration ? ? ?HOME EXERCISE PROGRAM: ?Patient was instructed today in a home exercise program today for post op shoulder range of motion. These included active assist shoulder flexion in sitting, scapular retrac

## 2021-11-11 ENCOUNTER — Ambulatory Visit: Payer: 59 | Attending: General Surgery | Admitting: Physical Therapy

## 2021-11-11 ENCOUNTER — Ambulatory Visit
Admission: RE | Admit: 2021-11-11 | Discharge: 2021-11-11 | Disposition: A | Discharge status: Home / Self Care | Payer: 59 | Source: Ambulatory Visit | Attending: General Surgery | Admitting: General Surgery

## 2021-11-11 ENCOUNTER — Encounter: Payer: Self-pay | Admitting: Physical Therapy

## 2021-11-11 DIAGNOSIS — C50919 Malignant neoplasm of unspecified site of unspecified female breast: Secondary | ICD-10-CM | POA: Insufficient documentation

## 2021-11-11 DIAGNOSIS — C50412 Malignant neoplasm of upper-outer quadrant of left female breast: Secondary | ICD-10-CM

## 2021-11-11 DIAGNOSIS — Z170 Estrogen receptor positive status [ER+]: Secondary | ICD-10-CM | POA: Insufficient documentation

## 2021-11-11 DIAGNOSIS — C50912 Malignant neoplasm of unspecified site of left female breast: Secondary | ICD-10-CM | POA: Diagnosis not present

## 2021-11-11 DIAGNOSIS — R293 Abnormal posture: Secondary | ICD-10-CM | POA: Insufficient documentation

## 2021-11-11 DIAGNOSIS — R921 Mammographic calcification found on diagnostic imaging of breast: Secondary | ICD-10-CM | POA: Diagnosis not present

## 2021-11-11 NOTE — Progress Notes (Signed)

## 2021-11-11 NOTE — H&P (Signed)
?REFERRING PHYSICIAN:  Meisinger ?  ?  ?PROVIDER:   LEDFORD , MD ?  ?Care Team: Patient Care Team: ?Forbes, Mary Joan, MD as PCP - General (Internal Medicine) ?Atwater, Amber Reck, MD as Consulting Provider (Dermatology) ?Pahwani, [name] Ravi, MD (Family Medicine) ?Meisinger, Todd Douglas, MD (Obstetrics and Gynecology) ?,  Ledford, MD as Consulting Provider (Surgical Oncology) ?Finnegan, Timothy John, MD (Hematology and Oncology) ?Chrystal, Glenn S, MD (Radiation Oncology) ?Patel, Mayur Khandu, MD as Consulting Provider (Rheumatology)  ?  ?MRN: [id number] ?DOB: 12/26/1970 ?DATE OF ENCOUNTER: 11/03/2021 ?  ?Subjective  ?  ?Chief Complaint: Breast Cancer ?  ?  ?  ?History of Present Illness: ?[name] is a 51 y.o. female who is seen today as an office consultation at the request of Dr. Meisinger for evaluation of Breast Cancer ?  ?Patient is a lovely 51-year-old female who presents with a new diagnosis of left breast cancer April 2023 in [place].  She had screening detected left breast calcifications.  Apparently this was in 2020.  She subsequently underwent diagnostic mammogram with implant displacement on 10/06/2021.  This showed a 1.3 cm group of calcifications in the left upper outer breast.  She was noted to have multiple bilateral benign cysts.  Core needle biopsy was performed and this demonstrated high-grade ductal carcinoma in situ with comedonecrosis and a small focus of invasive mammary carcinoma.  ER was positive with strong intensity, PR was positive with moderate intensity and HER2 was equivocal. ?  ?She has not personally had a diagnosis of cancer before this 1.  She does have a positive family history for pancreatic cancer in her mother and maternal aunt.  She found out that her biological father had a sister who was diagnosed with breast cancer at age 61.  Because of this she had genetic testing last year which was negative. ?  ?She also has Wegener's granulomatosis.  This  is taken care of by [facility] (Dr. Mayur Patel).  ?  ?She works in the lab at [employer].  ?  ?Diagnostic mammogram/ultrasound 10/06/2021 ?ACR Breast Density Category c: The breast tissue is heterogeneously ?dense, which may obscure small masses. ?  ?FINDINGS: ?Spot magnification views of the LEFT breast demonstrate a group of ?pleomorphic calcifications in the LEFT upper outer breast at middle ?to posterior depth. They span approximately 13 mm. This is not ?stable in comparison to prior. A questioned asymmetry in the LEFT ?lower breast resolves with additional views, most consistent with ?overlapping tissue. No additional suspicious findings noted. The ?patient has retropectoral saline implants. ?  ?In the RIGHT breast, there is an oval circumscribed mass in the ?RIGHT upper outer breast at anterior depth which is not definitively ?stable in comparison to remote prior mammograms. No additional ?suspicious findings are noted in the RIGHT breast. ?  ?On physical exam, no suspicious appreciated in LEFT lower breast or ?upper outer breast. ?  ?Targeted ultrasound was performed of the RIGHT outer breast. At 9 ?o'clock less than 1 cm from the nipple, there is an oval ?circumscribed anechoic mass with posterior acoustic enhancement. It ?measures 9 x 9 x 8 mm and is consistent with a benign simple cyst. ?This corresponds to the site of mammographic concern. ?  ?Targeted ultrasound was performed of the LEFT lower breast. No ?suspicious cystic or solid mass is seen at the site of mammographic ?concern. Multiple subcentimeter cysts were incidentally noted during ?real-time examination. ?  ?IMPRESSION: ?1. A 13 mm group of calcifications in the LEFT upper outer   breast is ?indeterminate. Recommend stereotactic guided biopsy for definitive ?characterization. ?2. There are multiple bilateral benign cysts. No mammographic ?evidence of malignancy in the RIGHT breast. ?  ?RECOMMENDATION: ?LEFT stereotactic guided biopsy x1 ?  ?I  have discussed the findings and recommendations with the patient. ?The biopsy procedure was discussed with the patient and questions ?were answered. Patient expressed their understanding of the biopsy ?recommendation. Patient will be scheduled for biopsy at her earliest ?convenience by the schedulers. Ordering provider will be notified. ?If applicable, a reminder letter will be sent to the patient ?regarding the next appointment. ?  ?BI-RADS CATEGORY  4: Suspicious. ?  ?Pathology core needle biopsy: 10/23/2021 ?A. BREAST, LEFT UPPER OUTER QUADRANT; STEREOTACTIC CORE NEEDLE BIOPSY:  ?- SMALL FOCUS OF INVASIVE MAMMARY CARCINOMA, NO SPECIAL TYPE.  ?- BACKGROUND EXTENSIVE DUCTAL CARCINOMA IN SITU, HIGH GRADE WITH  ?COMEDONECROSIS AND CALCIFICATIONS. ?  ?Receptors: ?Estrogen Receptor (ER) Status: POSITIVE  ?        Percentage of cells with nuclear positivity: 81-90%  ?        Average intensity of staining: Strong  ? ?Progesterone Receptor (PgR) Status: POSITIVE  ?        Percentage of cells with nuclear positivity: 1-10%  ?        Average intensity of staining: Moderate  ? ?HER2 (by immunohistochemistry): EQUIVOCAL (Score 2+)  ?                     Percentage of cells with uniform intense complete  ?membrane staining: 1-2%  ?FISH for HER2 amplification deferred to final resection.  ? ?Ki-67: Not performed  ?  ?Review of Systems: ?A complete review of systems was obtained from the patient.  I have reviewed this information and discussed as appropriate with the patient.  See HPI as well for other ROS. ?  ?Review of Systems  ?HENT: Positive for hearing loss and nosebleeds.   ?Psychiatric/Behavioral: The patient is nervous/anxious.   ?All other systems reviewed and are negative. ?  ?  ?  ?Medical History: ?Past Medical History  ?    ?Past Medical History:  ?Diagnosis Date  ? Allergy    ? Anxiety    ? Asthma without status asthmaticus    ?  adult onset  ? Depression    ? Dermatitis    ? GERD (gastroesophageal reflux disease)     ? Granulomatosis with polyangiitis with pulmonary involvement (CMS-HCC)    ? History of cancer    ? History of colitis    ? MRSA (methicillin resistant Staphylococcus aureus)    ?  On top of hand  ?  ?  ?  ?   ?Patient Active Problem List  ?Diagnosis  ? Asthma  ? Dermatitis  ? Nasal septal perforation  ? Sinusitis, unspecified  ? Otitis  ? Sensorineural hearing loss of right ear  ? Positive ANA (antinuclear antibody)  ? 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 Long-term use of immunosuppressant medication  ? Malignant neoplasm of upper-outer quadrant of left breast in female, estrogen receptor positive (CMS-HCC)  ? S/P bilateral breast implants  ?  ?  ?Past Surgical History  ?     ?Past Surgical History:  ?Procedure Laterality Date  ? RHINOPLASTY   2008  ? breast lift   2009  ? HYSTERECTOMY VAGINAL   2010  ?  partial  ? Carpal Tunnel surgery   2011  ? INSERTION NASAL SEPTAL BUTTON N/A 01/21/2018  ?  Procedure: INSERTION, NASAL SEPTAL PROSTHESIS (BUTTON);  Surgeon: Ross, James Grooms, MD;  Location: [facility];  Service: Otolaryngology Head and Neck;  Laterality: N/A;  ? vaginal wall repair surgery   12/2020  ? BREAST SURGERY      ? COLONOSCOPY      ? DILATION AND CURETTAGE, DIAGNOSTIC / THERAPEUTIC   1999,2004  ?  ?  ?  ?Allergies  ?     ?Allergies  ?Allergen Reactions  ? Kiwi (Actinidia Chinensis) Shortness Of Breath and Swelling  ? Alprazolam Other (See Comments)  ?    Makes patient hyperactive   ? Erythromycin Base Other (See Comments)  ? Lorazepam Other (See Comments)  ?    Hyperactivity per patient  ? Nitrofurantoin Other (See Comments)  ?    REACTION: Nausea and vomiting  ?  ?  ?  ?      ?Current Outpatient Medications on File Prior to Visit  ?Medication Sig Dispense Refill  ? albuterol 90 mcg/actuation inhaler Inhale 2 inhalations into the lungs as needed      ? fluticasone propion-salmeteroL (ADVAIR DISKUS) 250-50 mcg/dose diskus inhaler Inhale 1 inhalation into the lungs every  12 (twelve) hours      ? folic acid (FOLVITE) 1 MG tablet Take 1 tablet (1 mg total) by mouth once daily 90 tablet 3  ? gabapentin (NEURONTIN) 300 MG capsule Take 1 capsule (300 mg total) by mouth a

## 2021-11-12 ENCOUNTER — Other Ambulatory Visit (HOSPITAL_COMMUNITY): Payer: Self-pay

## 2021-11-12 ENCOUNTER — Other Ambulatory Visit: Payer: Self-pay

## 2021-11-12 ENCOUNTER — Ambulatory Visit (HOSPITAL_BASED_OUTPATIENT_CLINIC_OR_DEPARTMENT_OTHER): Payer: 59 | Admitting: Anesthesiology

## 2021-11-12 ENCOUNTER — Ambulatory Visit (HOSPITAL_BASED_OUTPATIENT_CLINIC_OR_DEPARTMENT_OTHER)
Admission: RE | Admit: 2021-11-12 | Discharge: 2021-11-12 | Disposition: A | Discharge status: Home / Self Care | Payer: 59 | Attending: General Surgery | Admitting: General Surgery

## 2021-11-12 ENCOUNTER — Encounter (HOSPITAL_BASED_OUTPATIENT_CLINIC_OR_DEPARTMENT_OTHER): Payer: Self-pay | Admitting: General Surgery

## 2021-11-12 ENCOUNTER — Ambulatory Visit
Admission: RE | Admit: 2021-11-12 | Discharge: 2021-11-12 | Disposition: A | Discharge status: Home / Self Care | Payer: 59 | Source: Ambulatory Visit | Attending: General Surgery | Admitting: General Surgery

## 2021-11-12 ENCOUNTER — Encounter (HOSPITAL_BASED_OUTPATIENT_CLINIC_OR_DEPARTMENT_OTHER)
Admission: RE | Disposition: A | Discharge status: Home / Self Care | Payer: Self-pay | Source: Home / Self Care | Attending: General Surgery

## 2021-11-12 DIAGNOSIS — Z9882 Breast implant status: Secondary | ICD-10-CM | POA: Insufficient documentation

## 2021-11-12 DIAGNOSIS — Z170 Estrogen receptor positive status [ER+]: Secondary | ICD-10-CM

## 2021-11-12 DIAGNOSIS — N6082 Other benign mammary dysplasias of left breast: Secondary | ICD-10-CM | POA: Diagnosis not present

## 2021-11-12 DIAGNOSIS — F419 Anxiety disorder, unspecified: Secondary | ICD-10-CM | POA: Insufficient documentation

## 2021-11-12 DIAGNOSIS — M313 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: Secondary | ICD-10-CM | POA: Insufficient documentation

## 2021-11-12 DIAGNOSIS — N6012 Diffuse cystic mastopathy of left breast: Secondary | ICD-10-CM | POA: Diagnosis not present

## 2021-11-12 DIAGNOSIS — G8918 Other acute postprocedural pain: Secondary | ICD-10-CM | POA: Diagnosis not present

## 2021-11-12 DIAGNOSIS — C50412 Malignant neoplasm of upper-outer quadrant of left female breast: Secondary | ICD-10-CM | POA: Diagnosis not present

## 2021-11-12 DIAGNOSIS — D0512 Intraductal carcinoma in situ of left breast: Secondary | ICD-10-CM | POA: Insufficient documentation

## 2021-11-12 DIAGNOSIS — J45909 Unspecified asthma, uncomplicated: Secondary | ICD-10-CM | POA: Insufficient documentation

## 2021-11-12 DIAGNOSIS — F32A Depression, unspecified: Secondary | ICD-10-CM | POA: Diagnosis not present

## 2021-11-12 DIAGNOSIS — C50912 Malignant neoplasm of unspecified site of left female breast: Secondary | ICD-10-CM | POA: Diagnosis not present

## 2021-11-12 DIAGNOSIS — R928 Other abnormal and inconclusive findings on diagnostic imaging of breast: Secondary | ICD-10-CM | POA: Diagnosis not present

## 2021-11-12 DIAGNOSIS — Z79899 Other long term (current) drug therapy: Secondary | ICD-10-CM | POA: Diagnosis not present

## 2021-11-12 DIAGNOSIS — N641 Fat necrosis of breast: Secondary | ICD-10-CM | POA: Diagnosis not present

## 2021-11-12 DIAGNOSIS — R920 Mammographic microcalcification found on diagnostic imaging of breast: Secondary | ICD-10-CM | POA: Diagnosis not present

## 2021-11-12 DIAGNOSIS — K219 Gastro-esophageal reflux disease without esophagitis: Secondary | ICD-10-CM | POA: Insufficient documentation

## 2021-11-12 HISTORY — PX: BREAST LUMPECTOMY WITH RADIOACTIVE SEED AND SENTINEL LYMPH NODE BIOPSY: SHX6550

## 2021-11-12 SURGERY — BREAST LUMPECTOMY WITH RADIOACTIVE SEED AND SENTINEL LYMPH NODE BIOPSY
Anesthesia: Regional | Site: Breast | Laterality: Left

## 2021-11-12 MED ORDER — PROPOFOL 10 MG/ML IV BOLUS
INTRAVENOUS | Status: DC | PRN
  Administered 2021-11-12: 200 mg via INTRAVENOUS
  Administered 2021-11-12: 50 mg via INTRAVENOUS

## 2021-11-12 MED ORDER — MIDAZOLAM HCL 2 MG/2ML IJ SOLN
INTRAMUSCULAR | Status: AC
  Filled 2021-11-12: qty 2

## 2021-11-12 MED ORDER — ACETAMINOPHEN 500 MG PO TABS: 1000.0000 mg | ORAL_TABLET | ORAL | Status: DC

## 2021-11-12 MED ORDER — GABAPENTIN 300 MG PO CAPS
ORAL_CAPSULE | ORAL | Status: AC
  Filled 2021-11-12: qty 1

## 2021-11-12 MED ORDER — FENTANYL CITRATE (PF) 100 MCG/2ML IJ SOLN
100.0000 ug | Freq: Once | INTRAMUSCULAR | Status: DC
Start: 2021-11-12 — End: 2021-11-12

## 2021-11-12 MED ORDER — BUPIVACAINE-EPINEPHRINE (PF) 0.25% -1:200000 IJ SOLN
INTRAMUSCULAR | Status: AC
Start: 2021-11-12 — End: ?
  Filled 2021-11-12: qty 30

## 2021-11-12 MED ORDER — OXYCODONE HCL 5 MG PO TABS
ORAL_TABLET | ORAL | Status: AC
  Filled 2021-11-12: qty 1

## 2021-11-12 MED ORDER — DEXAMETHASONE SODIUM PHOSPHATE 4 MG/ML IJ SOLN
INTRAMUSCULAR | Status: DC | PRN
  Administered 2021-11-12: 5 mg via INTRAVENOUS

## 2021-11-12 MED ORDER — FENTANYL CITRATE (PF) 100 MCG/2ML IJ SOLN
INTRAMUSCULAR | Status: DC | PRN
  Administered 2021-11-12 (×2): 50 ug via INTRAVENOUS

## 2021-11-12 MED ORDER — OXYCODONE HCL 5 MG PO TABS
5.0000 mg | ORAL_TABLET | Freq: Once | ORAL | Status: AC | PRN
  Administered 2021-11-12: 5 mg via ORAL

## 2021-11-12 MED ORDER — LACTATED RINGERS IV SOLN: INTRAVENOUS | Status: DC

## 2021-11-12 MED ORDER — PHENYLEPHRINE 80 MCG/ML (10ML) SYRINGE FOR IV PUSH (FOR BLOOD PRESSURE SUPPORT)
PREFILLED_SYRINGE | INTRAVENOUS | Status: AC
  Filled 2021-11-12: qty 20

## 2021-11-12 MED ORDER — MAGTRACE LYMPHATIC TRACER
INTRAMUSCULAR | Status: DC | PRN
  Administered 2021-11-12: 2 mL via INTRAMUSCULAR

## 2021-11-12 MED ORDER — EPHEDRINE SULFATE (PRESSORS) 50 MG/ML IJ SOLN
INTRAMUSCULAR | Status: DC | PRN
  Administered 2021-11-12 (×2): 10 mg via INTRAVENOUS

## 2021-11-12 MED ORDER — FENTANYL CITRATE (PF) 100 MCG/2ML IJ SOLN
INTRAMUSCULAR | Status: AC
Start: 2021-11-12 — End: ?
  Filled 2021-11-12: qty 2

## 2021-11-12 MED ORDER — BUPIVACAINE-EPINEPHRINE (PF) 0.5% -1:200000 IJ SOLN
INTRAMUSCULAR | Status: DC | PRN
  Administered 2021-11-12: 30 mL via PERINEURAL

## 2021-11-12 MED ORDER — FENTANYL CITRATE (PF) 100 MCG/2ML IJ SOLN
INTRAMUSCULAR | Status: AC
  Filled 2021-11-12: qty 2

## 2021-11-12 MED ORDER — BUPIVACAINE HCL 0.25 % IJ SOLN
INTRAMUSCULAR | Status: DC | PRN
  Administered 2021-11-12: 14 mL via INTRAMUSCULAR

## 2021-11-12 MED ORDER — PROPOFOL 500 MG/50ML IV EMUL
INTRAVENOUS | Status: DC | PRN
  Administered 2021-11-12: 35 ug/kg/min via INTRAVENOUS

## 2021-11-12 MED ORDER — PROPOFOL 10 MG/ML IV BOLUS
INTRAVENOUS | Status: AC
  Filled 2021-11-12: qty 20

## 2021-11-12 MED ORDER — CHLORHEXIDINE GLUCONATE CLOTH 2 % EX PADS
6.0000 | MEDICATED_PAD | Freq: Once | CUTANEOUS | Status: DC
Start: 2021-11-12 — End: 2021-11-12

## 2021-11-12 MED ORDER — LIDOCAINE HCL (PF) 1 % IJ SOLN
INTRAMUSCULAR | Status: AC
  Filled 2021-11-12: qty 30

## 2021-11-12 MED ORDER — MIDAZOLAM HCL 2 MG/2ML IJ SOLN
2.0000 mg | Freq: Once | INTRAMUSCULAR | Status: AC
Start: 2021-11-12 — End: 2021-11-12
  Administered 2021-11-12: 2 mg via INTRAVENOUS

## 2021-11-12 MED ORDER — CEFAZOLIN SODIUM-DEXTROSE 2-4 GM/100ML-% IV SOLN
2.0000 g | INTRAVENOUS | Status: AC
  Administered 2021-11-12: 2 g via INTRAVENOUS

## 2021-11-12 MED ORDER — PROPOFOL 500 MG/50ML IV EMUL
INTRAVENOUS | Status: AC
  Filled 2021-11-12: qty 50

## 2021-11-12 MED ORDER — TRAMADOL HCL 50 MG PO TABS
50.0000 mg | ORAL_TABLET | Freq: Four times a day (QID) | ORAL | 0 refills | Status: DC | PRN
  Filled 2021-11-12: qty 10, 2d supply, fill #0

## 2021-11-12 MED ORDER — ACETAMINOPHEN 500 MG PO TABS
1000.0000 mg | ORAL_TABLET | Freq: Once | ORAL | Status: DC
Start: 2021-11-12 — End: 2021-11-12

## 2021-11-12 MED ORDER — OXYCODONE HCL 5 MG/5ML PO SOLN: 5.0000 mg | Freq: Once | ORAL | Status: AC | PRN

## 2021-11-12 MED ORDER — CEFAZOLIN SODIUM-DEXTROSE 2-4 GM/100ML-% IV SOLN
INTRAVENOUS | Status: AC
  Filled 2021-11-12: qty 100

## 2021-11-12 MED ORDER — GABAPENTIN 300 MG PO CAPS
300.0000 mg | ORAL_CAPSULE | ORAL | Status: AC
  Administered 2021-11-12: 300 mg via ORAL

## 2021-11-12 MED ORDER — FENTANYL CITRATE (PF) 100 MCG/2ML IJ SOLN: 25.0000 ug | INTRAMUSCULAR | Status: DC | PRN

## 2021-11-12 MED ORDER — ONDANSETRON HCL 4 MG/2ML IJ SOLN
INTRAMUSCULAR | Status: DC | PRN
  Administered 2021-11-12: 4 mg via INTRAVENOUS

## 2021-11-12 SURGICAL SUPPLY — 62 items
ADH SKN CLS APL DERMABOND .7 (GAUZE/BANDAGES/DRESSINGS) ×1
APL PRP STRL LF DISP 70% ISPRP (MISCELLANEOUS) ×1
BINDER BREAST XLRG (GAUZE/BANDAGES/DRESSINGS) ×1 IMPLANT
BLADE SURG 10 STRL SS (BLADE) ×2 IMPLANT
BLADE SURG 15 STRL LF DISP TIS (BLADE) ×1 IMPLANT
BLADE SURG 15 STRL SS (BLADE) ×2
BNDG COHESIVE 4X5 TAN ST LF (GAUZE/BANDAGES/DRESSINGS) ×2 IMPLANT
CANISTER SUC SOCK COL 7IN (MISCELLANEOUS) IMPLANT
CANISTER SUCT 1200ML W/VALVE (MISCELLANEOUS) ×2 IMPLANT
CHLORAPREP W/TINT 26 (MISCELLANEOUS) ×2 IMPLANT
CLIP TI LARGE 6 (CLIP) ×2 IMPLANT
CLIP TI MEDIUM 6 (CLIP) ×4 IMPLANT
COVER MAYO STAND STRL (DRAPES) ×4 IMPLANT
COVER PROBE W GEL 5X96 (DRAPES) ×3 IMPLANT
DERMABOND ADVANCED (GAUZE/BANDAGES/DRESSINGS) ×1
DERMABOND ADVANCED .7 DNX12 (GAUZE/BANDAGES/DRESSINGS) ×1 IMPLANT
DRAPE UTILITY XL STRL (DRAPES) ×2 IMPLANT
DRSG PAD ABDOMINAL 8X10 ST (GAUZE/BANDAGES/DRESSINGS) ×2 IMPLANT
ELECT COATED BLADE 2.86 ST (ELECTRODE) ×2 IMPLANT
ELECT REM PT RETURN 9FT ADLT (ELECTROSURGICAL) ×2
ELECTRODE REM PT RTRN 9FT ADLT (ELECTROSURGICAL) ×1 IMPLANT
GAUZE SPONGE 4X4 12PLY STRL LF (GAUZE/BANDAGES/DRESSINGS) ×2 IMPLANT
GLOVE BIO SURGEON STRL SZ 6 (GLOVE) ×2 IMPLANT
GLOVE BIOGEL PI IND STRL 6.5 (GLOVE) ×1 IMPLANT
GLOVE BIOGEL PI IND STRL 7.0 (GLOVE) IMPLANT
GLOVE BIOGEL PI INDICATOR 6.5 (GLOVE) ×1
GLOVE BIOGEL PI INDICATOR 7.0 (GLOVE) ×1
GLOVE SURG SS PI 7.0 STRL IVOR (GLOVE) ×1 IMPLANT
GOWN STRL REUS W/ TWL LRG LVL3 (GOWN DISPOSABLE) ×1 IMPLANT
GOWN STRL REUS W/TWL 2XL LVL3 (GOWN DISPOSABLE) ×2 IMPLANT
GOWN STRL REUS W/TWL LRG LVL3 (GOWN DISPOSABLE) ×2
KIT MARKER MARGIN INK (KITS) ×2 IMPLANT
LIGHT WAVEGUIDE WIDE FLAT (MISCELLANEOUS) ×1 IMPLANT
NDL HYPO 25X1 1.5 SAFETY (NEEDLE) ×1 IMPLANT
NDL SAFETY ECLIPSE 18X1.5 (NEEDLE) ×1 IMPLANT
NEEDLE HYPO 18GX1.5 SHARP (NEEDLE) ×2
NEEDLE HYPO 25X1 1.5 SAFETY (NEEDLE) ×2 IMPLANT
NS IRRIG 1000ML POUR BTL (IV SOLUTION) ×2 IMPLANT
PACK BASIN DAY SURGERY FS (CUSTOM PROCEDURE TRAY) ×2 IMPLANT
PACK UNIVERSAL I (CUSTOM PROCEDURE TRAY) ×2 IMPLANT
PENCIL SMOKE EVACUATOR (MISCELLANEOUS) ×2 IMPLANT
SLEEVE SCD COMPRESS KNEE MED (STOCKING) ×2 IMPLANT
SPIKE FLUID TRANSFER (MISCELLANEOUS) IMPLANT
SPONGE T-LAP 18X18 ~~LOC~~+RFID (SPONGE) ×3 IMPLANT
STOCKINETTE IMPERVIOUS LG (DRAPES) ×2 IMPLANT
STRIP CLOSURE SKIN 1/2X4 (GAUZE/BANDAGES/DRESSINGS) ×2 IMPLANT
SUT ETHILON 2 0 FS 18 (SUTURE) IMPLANT
SUT MNCRL AB 4-0 PS2 18 (SUTURE) ×2 IMPLANT
SUT MON AB 5-0 PS2 18 (SUTURE) IMPLANT
SUT SILK 2 0 SH (SUTURE) IMPLANT
SUT VIC AB 2-0 SH 27 (SUTURE) ×2
SUT VIC AB 2-0 SH 27XBRD (SUTURE) ×1 IMPLANT
SUT VIC AB 3-0 SH 27 (SUTURE) ×2
SUT VIC AB 3-0 SH 27X BRD (SUTURE) ×1 IMPLANT
SUT VICRYL 3-0 CR8 SH (SUTURE) ×1 IMPLANT
SYR BULB EAR ULCER 3OZ GRN STR (SYRINGE) ×2 IMPLANT
SYR CONTROL 10ML LL (SYRINGE) ×2 IMPLANT
TOWEL GREEN STERILE FF (TOWEL DISPOSABLE) ×2 IMPLANT
TRACER MAGTRACE VIAL (MISCELLANEOUS) ×1 IMPLANT
TRAY FAXITRON CT DISP (TRAY / TRAY PROCEDURE) ×2 IMPLANT
TUBE CONNECTING 20X1/4 (TUBING) ×2 IMPLANT
YANKAUER SUCT BULB TIP NO VENT (SUCTIONS) ×2 IMPLANT

## 2021-11-12 NOTE — Anesthesia Procedure Notes (Signed)
Procedure Name: LMA Insertion ?Date/Time: 11/12/2021 12:01 PM ?Performed by: Signe Colt, CRNA ?Pre-anesthesia Checklist: Patient identified, Emergency Drugs available, Suction available and Patient being monitored ?Patient Re-evaluated:Patient Re-evaluated prior to induction ?Oxygen Delivery Method: Circle System Utilized ?Preoxygenation: Pre-oxygenation with 100% oxygen ?Induction Type: IV induction ?Ventilation: Mask ventilation without difficulty ?LMA: LMA inserted ?LMA Size: 4.0 ?Number of attempts: 1 ?Airway Equipment and Method: bite block ?Placement Confirmation: positive ETCO2 ?Tube secured with: Tape ?Dental Injury: Teeth and Oropharynx as per pre-operative assessment  ? ? ? ? ?

## 2021-11-12 NOTE — Discharge Instructions (Addendum)
[facility] ?Office Phone Number [phone] ? ?BREAST BIOPSY/ PARTIAL MASTECTOMY: POST OP INSTRUCTIONS ? ?Always review your discharge instruction sheet given to you by the facility where your surgery was performed. ? ?IF YOU HAVE DISABILITY OR FAMILY LEAVE FORMS, YOU MUST BRING THEM TO THE OFFICE FOR PROCESSING.  DO NOT GIVE THEM TO YOUR DOCTOR. ? ?Take 2 tylenol (acetominophen) three times a day for 3 days.  If you still have pain, add ibuprofen with food in between if able to take this (if you have kidney issues or stomach issues, do not take ibuprofen).  If both of those are not enough, add the narcotic pain pill.  If you find you are needing a lot of this overnight after surgery, call the next morning for a refill.   ? Prescriptions will not be filled after 5pm or on week-ends. ?Take your usually prescribed medications unless otherwise directed ?You should eat very light the first 24 hours after surgery, such as soup, crackers, pudding, etc.  Resume your normal diet the day after surgery. ?Most patients will experience some swelling and bruising in the breast.  Ice packs and a good support bra will help.  Swelling and bruising can take several days to resolve.  ?It is common to experience some constipation if taking pain medication after surgery.  Increasing fluid intake and taking a stool softener will usually help or prevent this problem from occurring.  A mild laxative (Milk of Magnesia or Miralax) should be taken according to package directions if there are no bowel movements after 48 hours. ?Unless discharge instructions indicate otherwise, you may remove your bandages 48 hours after surgery, and you may shower at that time.  You may have steri-strips (small skin tapes) in place directly over the incision.  These strips should be left on the skin at least for for 7-10 days.    ?ACTIVITIES:  You may resume regular daily activities (gradually increasing) beginning the next day.  Wearing a  good support bra or sports bra (or the breast binder) minimizes pain and swelling.  You may have sexual intercourse when it is comfortable. ?No heavy lifting for 1-2 weeks (not over around 10 pounds).  ?You may drive when you no longer are taking prescription pain medication, you can comfortably wear a seatbelt, and you can safely maneuver your car and apply brakes. ?RETURN TO WORK:  __________3-14 days depending on job. _______________ ?You should see your doctor in the office for a follow-up appointment approximately two weeks after your surgery.  Your doctor?s nurse will typically make your follow-up appointment when she calls you with your pathology report.  Expect your pathology report 3-4 business days after your surgery.  You may call to check if you do not hear from [place] after three days. ? ? ?WHEN TO CALL YOUR DOCTOR: ?Fever over 101.0 ?Nausea and/or vomiting. ?Extreme swelling or bruising. ?Continued bleeding from incision. ?Increased pain, redness, or drainage from the incision. ? ?The clinic staff is available to answer your questions during regular business hours.  Please don?t hesitate to call and ask to speak to one of the nurses for clinical concerns.  If you have a medical emergency, go to the nearest emergency room or call 911.  A surgeon from [facility] is always on call at the hospital. ? ?For further questions, please visit [email or web address]  ? ? ?Post Anesthesia Home Care Instructions ? ?Activity: ?Get plenty of rest for the remainder of the day. A responsible individual must stay  with you for 24 hours following the procedure.  ?For the next 24 hours, DO NOT: ?-Drive a car ?-Paediatric nurse ?-Drink alcoholic beverages ?-Take any medication unless instructed by your physician ?-Make any legal decisions or sign important papers. ? ?Meals: ?Start with liquid foods such as gelatin or soup. Progress to regular foods as tolerated. Avoid greasy, spicy, heavy foods. If nausea  and/or vomiting occur, drink only clear liquids until the nausea and/or vomiting subsides. Call your physician if vomiting continues. ? ?Special Instructions/Symptoms: ?Your throat may feel dry or sore from the anesthesia or the breathing tube placed in your throat during surgery. If this causes discomfort, gargle with warm salt water. The discomfort should disappear within 24 hours. ? ?If you had a scopolamine patch placed behind your ear for the management of post- operative nausea and/or vomiting: ? ?1. The medication in the patch is effective for 72 hours, after which it should be removed.  Wrap patch in a tissue and discard in the trash. Wash hands thoroughly with soap and water. ?2. You may remove the patch earlier than 72 hours if you experience unpleasant side effects which may include dry mouth, dizziness or visual disturbances. ?3. Avoid touching the patch. Wash your hands with soap and water after contact with the patch. ? ?No tylenol until after 4:25 today if needed ?   ? ? ?

## 2021-11-12 NOTE — Transfer of Care (Signed)
Immediate Anesthesia Transfer of Care Note ? ?Patient: [name] ? ?Procedure(s) Performed: LEFT BREAST LUMPECTOMY WITH RADIOACTIVE SEED AND SENTINEL LYMPH NODE BIOPSY (Left: Breast) ? ?Patient Location: PACU ? ?Anesthesia Type:GA combined with regional for post-op pain ? ?Level of Consciousness: drowsy and patient cooperative ? ?Airway & Oxygen Therapy: Patient Spontanous Breathing and Patient connected to face mask oxygen ? ?Post-op Assessment: Report given to RN and Post -op Vital signs reviewed and stable ? ?Post vital signs: Reviewed and stable ? ?Last Vitals:  ?Vitals Value Taken Time  ?BP 122/74 11/12/21 1347  ?Temp    ?Pulse 80 11/12/21 1349  ?Resp 17 11/12/21 1349  ?SpO2 94 % 11/12/21 1349  ?Vitals shown include unvalidated device data. ? ?Last Pain:  ?Vitals:  ? 11/12/21 1017  ?TempSrc: Oral  ?PainSc: 0-No pain  ?   ? ?Patients Stated Pain Goal: 4 (11/12/21 1017) ? ?Complications: No notable events documented. ?

## 2021-11-12 NOTE — Progress Notes (Signed)
Assisted Dr. Ellender with left, pectoralis, ultrasound guided block. Side rails up, monitors on throughout procedure. See vital signs in flow sheet. Tolerated Procedure well. 

## 2021-11-12 NOTE — Anesthesia Preprocedure Evaluation (Addendum)
Anesthesia Evaluation  ?Patient identified by MRN, date of birth, ID band ?Patient awake ? ? ? ?Reviewed: ?Allergy & Precautions, NPO status , Patient's Chart, lab work & pertinent test results ? ?History of Anesthesia Complications ?Negative for: history of anesthetic complications ? ?Airway ?Mallampati: II ? ?TM Distance: >3 FB ?Neck ROM: Full ? ? ? Dental ?no notable dental hx. ? ?  ?Pulmonary ?asthma ,  ?  ?Pulmonary exam normal ? ? ? ? ? ? ? Cardiovascular ?negative cardio ROS ?Normal cardiovascular exam ? ? ?  ?Neuro/Psych ?PSYCHIATRIC DISORDERS Anxiety Depression  Neuromuscular disease   ? GI/Hepatic ?Neg liver ROS, hiatal hernia, GERD  Medicated and Controlled,  ?Endo/Other  ?negative endocrine ROS ? Renal/GU ?Wegener's granulomatosis   ? ?  ?Musculoskeletal ?negative musculoskeletal ROS ?(+)  ? Abdominal ?  ?Peds ? Hematology ?negative hematology ROS ?(+)   ?Anesthesia Other Findings ?Left breast caner ? Reproductive/Obstetrics ? ?  ? ? ? ? ? ? ? ? ? ? ? ? ? ?  ?  ? ? ? ? ? ? ?Anesthesia Physical ?Anesthesia Plan ? ?ASA: 2 ? ?Anesthesia Plan: General and Regional  ? ?Post-op Pain Management: Regional block*  ? ?Induction: Intravenous ? ?PONV Risk Score and Plan: 3 and Treatment may vary due to age or medical condition, Ondansetron, Dexamethasone and Midazolam ? ?Airway Management Planned: LMA ? ?Additional Equipment:  ? ?Intra-op Plan:  ? ?Post-operative Plan: Extubation in OR ? ?Informed Consent: I have reviewed the patients History and Physical, chart, labs and discussed the procedure including the risks, benefits and alternatives for the proposed anesthesia with the patient or authorized representative who has indicated his/her understanding and acceptance.  ? ? ? ?Dental advisory given ? ?Plan Discussed with: CRNA ? ?Anesthesia Plan Comments:   ? ? ? ?Anesthesia Quick Evaluation ? ?

## 2021-11-12 NOTE — Anesthesia Procedure Notes (Signed)
Anesthesia Regional Block: Pectoralis block  ? ?Pre-Anesthetic Checklist: , timeout performed,  Correct Patient, Correct Site, Correct Laterality,  Correct Procedure, Correct Position, site marked,  Risks and benefits discussed,  Surgical consent,  Pre-op evaluation,  At surgeon's request and post-op pain management ? ?Laterality: Left ? ?Prep: chloraprep     ?  ?Needles:  ?Injection technique: Single-shot ? ?Needle Type: Echogenic Stimulator Needle   ? ? ?Needle Length: 9cm  ?Needle Gauge: 21  ? ? ? ?Additional Needles: ? ? ?Procedures:,,,, ultrasound used (permanent image in chart),,    ?Narrative:  ?Start time: 11/12/2021 10:30 AM ?End time: 11/12/2021 10:40 AM ?Injection made incrementally with aspirations every 5 mL. ? ?Performed by: Personally  ?Anesthesiologist: Murvin Natal, MD ? ?Additional Notes: ?Functioning IV was confirmed and monitors were applied.  A timeout was performed. Sterile prep, hand hygiene and sterile gloves were used. A 67m 21ga Arrow echogenic stimulator needle was used. Negative aspiration and negative test dose prior to incremental administration of local anesthetic. The patient tolerated the procedure well. ? ?Ultrasound guidance: relevent anatomy identified, needle position confirmed, local anesthetic spread visualized around nerve(s), vascular puncture avoided.  Image printed for medical record.  ? ? ? ? ? ?

## 2021-11-12 NOTE — Interval H&P Note (Signed)
History and Physical Interval Note: ? ?11/12/2021 ?11:28 AM ? ?[name]  has presented today for surgery, with the diagnosis of LEFT BREAST CANCER.  The various methods of treatment have been discussed with the patient and family. After consideration of risks, benefits and other options for treatment, the patient has consented to  Procedure(s): ?LEFT BREAST LUMPECTOMY WITH RADIOACTIVE SEED AND SENTINEL LYMPH NODE BIOPSY (Left) as a surgical intervention.  The patient's history has been reviewed, patient examined, no change in status, stable for surgery.  I have reviewed the patient's chart and labs.  Questions were answered to the patient's satisfaction.   ? ? ?Stark Klein ? ? ?

## 2021-11-12 NOTE — Anesthesia Postprocedure Evaluation (Signed)
Anesthesia Post Note ? ?Patient: [name] ? ?Procedure(s) Performed: LEFT BREAST LUMPECTOMY WITH RADIOACTIVE SEED AND SENTINEL LYMPH NODE BIOPSY (Left: Breast) ? ?  ? ?Patient location during evaluation: PACU ?Anesthesia Type: Regional and General ?Level of consciousness: awake ?Pain management: pain level controlled ?Vital Signs Assessment: post-procedure vital signs reviewed and stable ?Respiratory status: spontaneous breathing, nonlabored ventilation, respiratory function stable and patient connected to nasal cannula oxygen ?Cardiovascular status: blood pressure returned to baseline and stable ?Postop Assessment: no apparent nausea or vomiting ?Anesthetic complications: no ? ? ?No notable events documented. ? ?Last Vitals:  ?Vitals:  ? 11/12/21 1415 11/12/21 1427  ?BP: 112/68 121/80  ?Pulse: 94   ?Resp: (!) 21 20  ?Temp:  36.6 ?C  ?SpO2: 96% 99%  ?  ?Last Pain:  ?Vitals:  ? 11/12/21 1427  ?TempSrc:   ?PainSc: 2   ? ? ?  ?  ?  ?  ?  ?  ? ?Delonna Ney P Corinn Stoltzfus ? ? ? ? ?

## 2021-11-12 NOTE — Op Note (Signed)
left Breast Radioactive seed localized lumpectomy and sentinel lymph node biopsy ? 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  +/+/equivocal, deferred to final path for FISH ? ?Pre-operative Diagnosis: left breast cancer ? ?Post-operative Diagnosis: Same ? ?Surgeon: BStark Klein ? ?Anesthesia: General endotracheal anesthesia ? ?ASA Class: 2 ? ?Procedure Details  ?The patient was seen in the Holding Room. The risks, benefits, complications, treatment options, and expected outcomes were discussed with the patient. The possibilities of bleeding, infection, the need for additional procedures, failure to diagnose a condition, and creating a complication requiring transfusion or operation were discussed with the patient. The patient concurred with the proposed plan, giving informed consent.  The site of surgery properly noted/marked. The patient was taken to Operating Room # 6, identified, and the procedure verified as Left Breast Seed localized Lumpectomy with sentinel lymph node biopsy. The left arm, breast, and chest were prepped and draped in standard fashion. A Time Out was held and the above information confirmed.  The MagTrace was injected into the subareolar location and massaged for 5 minutes.  ? ?The lumpectomy was performed by creating a transverse lateral incision near the previously placed radioactive seed.  Dissection was carried down to around the point of maximum signal intensity. The cautery was used to perform the dissection.  When discussing the positioning of the seed/clip with Dr. BOwens Sharkyesterday, she noted that the calcifications were lateral to the seed and the clip by around 1.4 cm.  Therefore, all the tissue anterior and lateral to the seed was taken with the seed.  Hemostasis was achieved with cautery. The edges of the cavity were marked with large clips, with one each medial, lateral, inferior  and superior, and two clips posteriorly.   The specimen was inked with the margin marker paint kit.    Specimen radiography confirmed inclusion of the mammographic lesion, the clip, and the seed.  The background signal in the breast was zero.  The adjacent tissue was mobilized in order to minimize the defect in the breast.  This was pulled together with interrupted 2-0 vicryl sutures.  The wound was irrigated and closed with 3-0 vicryl in layers and 4-0 monocryl subcuticular suture.   ? ?Using a hand-held  Sentimag probe, left axillary sentinel nodes were identified transcutaneously.  An oblique incision was created below the axillary hairline.  Dissection was carried through the clavipectoral fascia.  Three deep level two axillary sentinel nodes were removed.  Counts per second were >9999, 5700, and 2500.    The background count was 120 cps.  The wound was irrigated.  Hemostasis was achieved with cautery.  The axillary incision was closed with a 3-0 vicryl deep dermal interrupted sutures and a 4-0 monocryl subcuticular closure. ?   ?Sterile dressings were applied. At the end of the operation, all sponge, instrument, and needle counts were correct. ? ?Findings: ?grossly clear surgical margins and no adenopathy, posteiror margin is pectoralis, anterior margin is skin.  Implant capsule was not seen or violated.   ? ?Estimated Blood Loss:  min ?        ?Specimens: left breast tissue with seed and three deep left axillary sentinel lymph nodes.     ?        ?Complications:  None; patient tolerated the procedure well. ?        ?Disposition: PACU - hemodynamically stable. ?        ?Condition: stable ? ?

## 2021-11-13 ENCOUNTER — Encounter (HOSPITAL_BASED_OUTPATIENT_CLINIC_OR_DEPARTMENT_OTHER): Payer: Self-pay | Admitting: General Surgery

## 2021-11-14 ENCOUNTER — Other Ambulatory Visit (HOSPITAL_COMMUNITY): Payer: Self-pay

## 2021-11-14 ENCOUNTER — Other Ambulatory Visit: Payer: Self-pay

## 2021-11-14 MED ORDER — DOXYCYCLINE MONOHYDRATE 100 MG PO CAPS
100.0000 mg | ORAL_CAPSULE | Freq: Two times a day (BID) | ORAL | 0 refills | Status: DC
  Filled 2021-11-14 (×2): qty 14, 7d supply, fill #0

## 2021-11-17 ENCOUNTER — Other Ambulatory Visit: Payer: Self-pay

## 2021-11-17 MED ORDER — DOXYCYCLINE MONOHYDRATE 100 MG PO CAPS
100.0000 mg | ORAL_CAPSULE | Freq: Two times a day (BID) | ORAL | 0 refills | Status: DC
  Filled 2021-11-17: qty 14, 7d supply, fill #0

## 2021-11-18 ENCOUNTER — Encounter: Payer: Self-pay | Admitting: Oncology

## 2021-11-18 ENCOUNTER — Other Ambulatory Visit: Payer: Self-pay

## 2021-11-18 DIAGNOSIS — E559 Vitamin D deficiency, unspecified: Secondary | ICD-10-CM | POA: Diagnosis not present

## 2021-11-18 DIAGNOSIS — M313 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: Secondary | ICD-10-CM | POA: Diagnosis not present

## 2021-11-18 DIAGNOSIS — D0512 Intraductal carcinoma in situ of left breast: Secondary | ICD-10-CM | POA: Diagnosis not present

## 2021-11-18 DIAGNOSIS — F32A Depression, unspecified: Secondary | ICD-10-CM | POA: Diagnosis not present

## 2021-11-18 DIAGNOSIS — F419 Anxiety disorder, unspecified: Secondary | ICD-10-CM | POA: Diagnosis not present

## 2021-11-18 DIAGNOSIS — E669 Obesity, unspecified: Secondary | ICD-10-CM | POA: Diagnosis not present

## 2021-11-18 DIAGNOSIS — Z13220 Encounter for screening for lipoid disorders: Secondary | ICD-10-CM | POA: Diagnosis not present

## 2021-11-18 DIAGNOSIS — J4520 Mild intermittent asthma, uncomplicated: Secondary | ICD-10-CM | POA: Diagnosis not present

## 2021-11-18 DIAGNOSIS — K219 Gastro-esophageal reflux disease without esophagitis: Secondary | ICD-10-CM | POA: Diagnosis not present

## 2021-11-18 LAB — SURGICAL PATHOLOGY

## 2021-11-23 NOTE — Progress Notes (Signed)
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  INTERVAL HISTORY: Patient returns to clinic today for further evaluation and discussion of her final pathology results.  She tolerated her lumpectomy well without significant side effects.  She currently feels well and is asymptomatic.  She has no neurologic complaints.  She denies any recent fevers or illnesses.  She has a good appetite and denies weight loss.  She has no chest pain, shortness of breath, cough, or hemoptysis.  She denies any nausea, vomiting, constipation, or diarrhea.  She has no urinary complaints.  Patient offers no specific complaints today.  REVIEW OF SYSTEMS:   Review of Systems  Constitutional: Negative.  Negative for fever, malaise/fatigue and weight loss.  Respiratory: Negative.  Negative for cough, hemoptysis and shortness of breath.   Cardiovascular:  Negative for chest pain and leg swelling.  Gastrointestinal: Negative.  Negative for abdominal pain.  Genitourinary: Negative.  Negative for dysuria.  Musculoskeletal: Negative.  Negative for back pain.  Skin: Negative.  Negative for rash.  Neurological: Negative.  Negative for dizziness, focal weakness, weakness and headaches.  Psychiatric/Behavioral: Negative.  The patient is not nervous/anxious.    As per HPI. Otherwise, a complete review of systems is negative.  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[place])    [facility]---  dx 2015,  takes methotrexate wkly, pt stated she is stable    PAST SURGICAL HISTORY: Past Surgical History:  Procedure Laterality Date   ANTERIOR AND POSTERIOR REPAIR N/A 01/14/2021   Procedure: ANTERIOR (CYSTOCELE) AND POSTERIOR REPAIR (RECTOCELE);  Surgeon: Cheri Fowler, MD;  [facility];  Service: Gynecology;  Laterality: N/A;   AUGMENTATION MAMMAPLASTY Bilateral 2010   saline   BREAST BIOPSY Left 10/23/2021   Stereo Bx, X-clip, path pending   BREAST ENHANCEMENT SURGERY  2009   BREAST LUMPECTOMY WITH RADIOACTIVE SEED AND SENTINEL LYMPH NODE BIOPSY Left 11/12/2021   Procedure: LEFT BREAST LUMPECTOMY WITH RADIOACTIVE SEED AND SENTINEL LYMPH NODE BIOPSY;  Surgeon: Stark Klein, MD;  Location: [facility];  Service: General;  Laterality: Left;   CARPAL TUNNEL RELEASE Right 2011   LAPAROSCOPIC SUPRACERVICAL HYSTERECTOMY  02/21/2010   @ [place] by Dr Willis Modena   NASAL SEPTUM SURGERY  01/2018   insertion button prosthesis for septal perforation   RHINOPLASTY  2005   nasal fracture    FAMILY HISTORY: Family History  Problem Relation Age of Onset   Asthma Mother    Depression Mother    Diabetes Mother    Hyperlipidemia Mother    Hypertension Mother    Mental illness Mother    Arthritis Mother    Pancreatic cancer Mother  Heart disease Father 56       AMI/CABG   Mental illness Sister    Alzheimer's disease Maternal Grandmother    Hyperlipidemia Maternal Grandmother    Hypertension Maternal Grandmother    Heart attack Maternal Grandfather    Lung cancer Maternal Grandfather    Myasthenia gravis Daughter    Charcot-Marie-Tooth disease Daughter    ADD / ADHD Daughter    Pancreatic  cancer Maternal Aunt     ADVANCED DIRECTIVES (Y/N):  N  HEALTH MAINTENANCE: Social History   Tobacco Use   Smoking status: Never   Smokeless tobacco: Never  Vaping Use   Vaping Use: Never used  Substance Use Topics   Alcohol use: Yes    Comment: occasional   Drug use: Never     Colonoscopy:  PAP:  Bone density:  Lipid panel:  Allergies  Allergen Reactions   Kiwi Extract Shortness Of Breath and Swelling   Ativan [Lorazepam] Other (See Comments)    Hyperactivity per patient   Erythromycin Base Nausea And Vomiting   Nitrofurantoin Nausea And Vomiting   Xanax [Alprazolam] Other (See Comments)    Makes patient hyperactive     Current Outpatient Medications  Medication Sig Dispense Refill   acetaminophen (TYLENOL) 500 MG tablet Take 1,000 mg by mouth every 6 (six) hours as needed.     ADVAIR DISKUS 100-50 MCG/DOSE AEPB INHALE 1 PUFF INTO THE LUNGS TWICE DAILY (Patient not taking: Reported on 11/06/2021) 1 each 0   albuterol (VENTOLIN HFA) 108 (90 Base) MCG/ACT inhaler Inhale 2 puffs into the lungs every 6 (six) hours as needed for wheezing or shortness of breath (cough, shortness of breath or wheezing.). (Patient not taking: Reported on 11/06/2021) 1 each 1   doxycycline (MONODOX) 100 MG capsule Take 1 capsule (100 mg total) by mouth 2 (two) times daily for 7 days 14 capsule 0   folic acid (FOLVITE) 1 MG tablet Take 1 tablet (1 mg total) by mouth once daily 90 tablet 3   gabapentin (NEURONTIN) 300 MG capsule Take 1 capsule (300 mg total) by mouth at bedtime 30 capsule 5   hydroxychloroquine (PLAQUENIL) 200 MG tablet Take 1 tablet (200 mg total) by mouth 2 (two) times daily (Patient taking differently: Take 200 mg by mouth daily.) 60 tablet 5   hydrOXYzine (VISTARIL) 25 MG capsule Take 1 capsule (25 mg total) by mouth daily as needed. 90 capsule 3   methotrexate (RHEUMATREX) 2.5 MG tablet Take 8 tablets (20 mg total) by mouth every 7 (seven) days 96 tablet 1   montelukast  (SINGULAIR) 10 MG tablet Take 1 tablet (10 mg total) by mouth daily. 30 tablet 6   pantoprazole (PROTONIX) 40 MG tablet TAKE ONE TABLET BY MOUTH TWICE A DAY BEFORE FOOD 180 tablet 3   sertraline (ZOLOFT) 100 MG tablet Take 1.5 tablets (150 mg total) by mouth daily. (Patient taking differently: Take 100 mg by mouth daily.) 140 tablet 3   sucralfate (CARAFATE) 1 g tablet as needed.     No current facility-administered medications for this visit.    OBJECTIVE: There were no vitals filed for this visit.    There is no height or weight on file to calculate BMI.    ECOG FS:0 - Asymptomatic  General: Well-developed, well-nourished, no acute distress. Eyes: Pink conjunctiva, anicteric sclera. HEENT: Normocephalic, moist mucous membranes. Breast: Exam performed by another provider. Lungs: No audible wheezing or coughing. Heart: Regular rate and rhythm. Abdomen: Soft, nontender, no obvious distention. Musculoskeletal: No  edema, cyanosis, or clubbing. Neuro: Alert, answering all questions appropriately. Cranial nerves grossly intact. Skin: No rashes or petechiae noted. Psych: Normal affect.   LAB RESULTS:  Lab Results  Component Value Date   NA 142 01/16/2021   K 4.2 01/16/2021   CL 108 01/16/2021   CO2 27 01/16/2021   GLUCOSE 76 01/16/2021   BUN 16 01/16/2021   CREATININE 0.71 01/16/2021   CALCIUM 8.5 01/16/2021   PROT 5.7 (L) 01/16/2021   ALBUMIN 3.7 01/16/2021   AST 14 01/16/2021   ALT 7 01/16/2021   ALKPHOS 50 01/16/2021   BILITOT 0.4 01/16/2021   GFRNONAA >60 06/20/2020   GFRAA 121 06/10/2016    Lab Results  Component Value Date   WBC 10.7 (H) 01/15/2021   NEUTROABS 3.1 06/20/2020   HGB 10.2 (L) 01/15/2021   HCT 32.0 (L) 01/15/2021   MCV 95.0 01/15/2021   PLT 225 01/15/2021     STUDIES: MM Breast Surgical Specimen  Result Date: 11/12/2021 CLINICAL DATA:  Status post seed localized LEFT lumpectomy. EXAM: SPECIMEN RADIOGRAPH OF THE LEFT BREAST COMPARISON:  Prior  studies FINDINGS: Status post excision of the LEFT breast. The radioactive seed and X shaped biopsy marker clip are present and intact. The residual calcifications and are not clearly seen, possibly related to the technique/resolution. Findings are discussed with Dr. Barry Dienes in the operating room. IMPRESSION: Specimen radiograph of the left breast. Electronically Signed   By: Nolon Nations M.D.   On: 11/12/2021 12:53  MM LT RADIOACTIVE SEED LOC MAMMO GUIDE  Result Date: 11/11/2021 CLINICAL DATA:  Patient presents for seed localization prior to lumpectomy of the LEFT breast. EXAM: MAMMOGRAPHIC GUIDED RADIOACTIVE SEED LOCALIZATION OF THE LEFT BREAST COMPARISON:  Prior studies FINDINGS: Patient presents for radioactive seed localization prior to lumpectomy. I met with the patient and we discussed the procedure of seed localization including benefits and alternatives. We discussed the high likelihood of a successful procedure. We discussed the risks of the procedure including infection, bleeding, tissue injury and further surgery. We discussed the low dose of radioactivity involved in the procedure. Informed, written consent was given. The usual time-out protocol was performed immediately prior to the procedure. Using mammographic guidance, sterile technique, 1% lidocaine and an I-125 radioactive seed, the X shaped clip in the LATERAL portion of the LEFT breast was localized using a LATERAL approach. The residual calcifications are 1.4 centimeters LATERAL to seed and X shaped clip, discussed with Dr. Barry Dienes. The follow-up mammogram images confirm the seed in the expected location and were marked for Dr. Barry Dienes. Follow-up survey of the patient confirms presence of the radioactive seed. Order number of I-125 seed:  [id number]. Total activity:  0.076 millicurie reference Date: 10/13/2021 The patient tolerated the procedure well and was released from the [facility]. She was given instructions regarding seed removal.  IMPRESSION: Radioactive seed localization LEFT breast. No apparent complications. Electronically Signed   By: Nolon Nations M.D.   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  Patient underwent lumpectomy on Nov 12, 2021.  Given the small amount of invasive component, patient does not require Oncotype testing or adjuvant chemotherapy.  She had consultation with radiation oncology today and will pursue adjuvant XRT in the next several weeks.  She will also benefit from either tamoxifen or an aromatase inhibitor for 5 years after the completion  of her treatments.  Return to clinic during the last week of her XRT for further evaluation and initiation of treatment.   Genetics: Patient was tested in January 2022 and this was reported as negative.    I spent a total of 20 minutes reviewing chart data, face-to-face evaluation with the patient, counseling and coordination of care as detailed above.    Patient expressed understanding and was in agreement with this plan. She also understands that She can call clinic at any time with any questions, concerns, or complaints.    Cancer Staging  Ductal carcinoma in situ (DCIS) of left breast Staging form: Breast, AJCC 8th Edition - Clinical: Stage IA (cT41m, cN0, cM0, G1, ER+, PR+, HER2-) - Signed by FLloyd Huger MD on 11/28/2021 Histologic grading system: 3 grade system   TLloyd Huger MD   11/28/2021 6:57 AM

## 2021-11-24 ENCOUNTER — Ambulatory Visit: Payer: 59 | Admitting: Oncology

## 2021-11-24 ENCOUNTER — Institutional Professional Consult (permissible substitution): Payer: 59 | Admitting: Radiation Oncology

## 2021-11-26 ENCOUNTER — Other Ambulatory Visit: Payer: Self-pay

## 2021-11-26 ENCOUNTER — Encounter: Payer: Self-pay | Admitting: Radiation Oncology

## 2021-11-26 ENCOUNTER — Ambulatory Visit
Admission: RE | Admit: 2021-11-26 | Discharge: 2021-11-26 | Disposition: A | Discharge status: Home / Self Care | Payer: 59 | Source: Ambulatory Visit | Attending: Radiation Oncology | Admitting: Radiation Oncology

## 2021-11-26 ENCOUNTER — Inpatient Hospital Stay (HOSPITAL_BASED_OUTPATIENT_CLINIC_OR_DEPARTMENT_OTHER): Payer: 59 | Admitting: Oncology

## 2021-11-26 VITALS — BP 109/75 | HR 81 | Temp 97.4°F | Resp 16 | Ht 63.0 in | Wt 173.6 lb

## 2021-11-26 DIAGNOSIS — D0512 Intraductal carcinoma in situ of left breast: Secondary | ICD-10-CM | POA: Insufficient documentation

## 2021-11-26 DIAGNOSIS — K219 Gastro-esophageal reflux disease without esophagitis: Secondary | ICD-10-CM | POA: Diagnosis not present

## 2021-11-26 DIAGNOSIS — Z800 Family history of malignant neoplasm of digestive organs: Secondary | ICD-10-CM | POA: Diagnosis not present

## 2021-11-26 DIAGNOSIS — M313 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: Secondary | ICD-10-CM | POA: Insufficient documentation

## 2021-11-26 DIAGNOSIS — K449 Diaphragmatic hernia without obstruction or gangrene: Secondary | ICD-10-CM | POA: Insufficient documentation

## 2021-11-26 DIAGNOSIS — C50412 Malignant neoplasm of upper-outer quadrant of left female breast: Secondary | ICD-10-CM | POA: Diagnosis not present

## 2021-11-26 DIAGNOSIS — J45909 Unspecified asthma, uncomplicated: Secondary | ICD-10-CM | POA: Insufficient documentation

## 2021-11-26 DIAGNOSIS — Z8616 Personal history of COVID-19: Secondary | ICD-10-CM | POA: Insufficient documentation

## 2021-11-26 DIAGNOSIS — N3946 Mixed incontinence: Secondary | ICD-10-CM | POA: Diagnosis not present

## 2021-11-26 DIAGNOSIS — Z79899 Other long term (current) drug therapy: Secondary | ICD-10-CM | POA: Insufficient documentation

## 2021-11-26 DIAGNOSIS — Z170 Estrogen receptor positive status [ER+]: Secondary | ICD-10-CM | POA: Insufficient documentation

## 2021-11-26 NOTE — Consult Note (Signed)
NEW PATIENT EVALUATION  Name: [name]  MRN: [id number]  Date:   11/26/2021     DOB: 01/14/1971   This 51 y.o. 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  REFERRING PHYSICIAN: Pahwani, Michell Heinrich, MD  CHIEF COMPLAINT: No chief complaint on file.   DIAGNOSIS: The encounter diagnosis was Ductal carcinoma in situ (DCIS) of left breast.   PREVIOUS INVESTIGATIONS:  Mammograms and ultrasounds reviewed Pathology reports reviewed Clinical notes reviewed  HPI: Patient is a 51 year old female who presented with abnormal mammogram of her left breast showing a 13 mm group of calcifications in the upper outer quadrant for which recommended stereotactic biopsy was performed.  Biopsy was positive for ductal carcinoma in situ high-grade with comedonecrosis and a small focus of invasive mammary carcinoma no special type.  She went on to have a wide local excision of her left breast showing a 9 mm area of ductal carcinoma in situ grade 3 with comedonecrosis.  Margins were clear but close at 1 mm of the inferior margin.  3 l sentinel nodes were examined all negative for metastatic disease.  Tumor was ER/PR positive HER2/neu not overexpressed.  She has done well postoperatively.  Specifically denies breast tenderness cough or bone pain.  PLANNED TREATMENT REGIMEN: Left whole breast radiation  PAST MEDICAL HISTORY:  has a past medical history of Asthma, Cancer ([place]) (10/2021), Female cystocele, GAD (generalized anxiety disorder), GERD (gastroesophageal reflux disease), Hiatal hernia, History of 2019 novel coronavirus disease (COVID-19) (07/2020), MDD (major depressive disorder), Mixed incontinence urge and stress, Wears glasses, and 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[place]).     PAST SURGICAL HISTORY:  Past Surgical History:  Procedure Laterality Date   ANTERIOR AND POSTERIOR REPAIR N/A 01/14/2021   Procedure: ANTERIOR (CYSTOCELE) AND POSTERIOR REPAIR (RECTOCELE);  Surgeon: Cheri Fowler, MD;  Location: [facility];  Service: Gynecology;  Laterality: N/A;   AUGMENTATION MAMMAPLASTY Bilateral 2010   saline   BREAST BIOPSY Left 10/23/2021   Stereo Bx, X-clip, path pending   BREAST ENHANCEMENT SURGERY  2009   BREAST LUMPECTOMY WITH RADIOACTIVE SEED AND SENTINEL LYMPH NODE BIOPSY Left 11/12/2021   Procedure: LEFT BREAST LUMPECTOMY WITH RADIOACTIVE SEED AND SENTINEL LYMPH NODE BIOPSY;  Surgeon: Stark Klein, MD;  Location: [facility];  Service: General;  Laterality: Left;   CARPAL TUNNEL RELEASE Right 2011   LAPAROSCOPIC SUPRACERVICAL HYSTERECTOMY  02/21/2010   @ [place] by Dr Willis Modena   NASAL SEPTUM SURGERY  01/2018   insertion button prosthesis for septal perforation   RHINOPLASTY  2005   nasal fracture    FAMILY HISTORY: family history includes ADD / ADHD in her daughter; Alzheimer's disease in her maternal grandmother; Arthritis in her mother; Asthma in her mother; Charcot-Marie-Tooth disease in her daughter; Depression in her mother; Diabetes in her mother; Heart attack in her maternal grandfather; Heart disease (age of onset: 66) in her father; Hyperlipidemia in her maternal grandmother and mother; Hypertension in her maternal grandmother and mother; Lung cancer in her maternal grandfather; Mental illness in her mother and sister; Myasthenia gravis in her daughter; Pancreatic cancer in her maternal aunt and mother.  SOCIAL HISTORY:  reports that she has never smoked. She has never used smokeless tobacco. She reports current alcohol use. She reports that she does  not use drugs.  ALLERGIES: Kiwi extract, Ativan [lorazepam], Erythromycin base, Nitrofurantoin, and Xanax [alprazolam]  MEDICATIONS:  Current Outpatient Medications   Medication Sig Dispense Refill   acetaminophen (TYLENOL) 500 MG tablet Take 1,000 mg by mouth every 6 (six) hours as needed.     doxycycline (MONODOX) 100 MG capsule Take 1 capsule (100 mg total) by mouth 2 (two) times daily for 7 days 14 capsule 0   folic acid (FOLVITE) 1 MG tablet Take 1 tablet (1 mg total) by mouth once daily 90 tablet 3   gabapentin (NEURONTIN) 300 MG capsule Take 1 capsule (300 mg total) by mouth at bedtime 30 capsule 5   hydroxychloroquine (PLAQUENIL) 200 MG tablet Take 1 tablet (200 mg total) by mouth 2 (two) times daily (Patient taking differently: Take 200 mg by mouth daily.) 60 tablet 5   hydrOXYzine (VISTARIL) 25 MG capsule Take 1 capsule (25 mg total) by mouth daily as needed. 90 capsule 3   methotrexate (RHEUMATREX) 2.5 MG tablet Take 8 tablets (20 mg total) by mouth every 7 (seven) days 96 tablet 1   montelukast (SINGULAIR) 10 MG tablet Take 1 tablet (10 mg total) by mouth daily. 30 tablet 6   pantoprazole (PROTONIX) 40 MG tablet TAKE ONE TABLET BY MOUTH TWICE A DAY BEFORE FOOD 180 tablet 3   sertraline (ZOLOFT) 100 MG tablet Take 1.5 tablets (150 mg total) by mouth daily. (Patient taking differently: Take 100 mg by mouth daily.) 140 tablet 3   sucralfate (CARAFATE) 1 g tablet as needed.     ADVAIR DISKUS 100-50 MCG/DOSE AEPB INHALE 1 PUFF INTO THE LUNGS TWICE DAILY (Patient not taking: Reported on 11/06/2021) 1 each 0   albuterol (VENTOLIN HFA) 108 (90 Base) MCG/ACT inhaler Inhale 2 puffs into the lungs every 6 (six) hours as needed for wheezing or shortness of breath (cough, shortness of breath or wheezing.). (Patient not taking: Reported on 11/06/2021) 1 each 1   No current facility-administered medications for this encounter.    ECOG PERFORMANCE STATUS:  0 - Asymptomatic  REVIEW OF SYSTEMS: Patient denies any weight loss, fatigue, weakness, fever, chills or night sweats. Patient denies any loss of vision, blurred vision. Patient denies any ringing  of the ears or  hearing loss. No irregular heartbeat. Patient denies heart murmur or history of fainting. Patient denies any chest pain or pain radiating to her upper extremities. Patient denies any shortness of breath, difficulty breathing at night, cough or hemoptysis. Patient denies any swelling in the lower legs. Patient denies any nausea vomiting, vomiting of blood, or coffee ground material in the vomitus. Patient denies any stomach pain. Patient states has had normal bowel movements no significant constipation or diarrhea. Patient denies any dysuria, hematuria or significant nocturia. Patient denies any problems walking, swelling in the joints or loss of balance. Patient denies any skin changes, loss of hair or loss of weight. Patient denies any excessive worrying or anxiety or significant depression. Patient denies any problems with insomnia. Patient denies excessive thirst, polyuria, polydipsia. Patient denies any swollen glands, patient denies easy bruising or easy bleeding. Patient denies any recent infections, allergies or URI. Patient "s visual fields have not changed significantly in recent time.   PHYSICAL EXAM: BP 109/75 (BP Location: Right Arm, Patient Position: Sitting, Cuff Size: Normal)   Pulse 81   Temp (!) 97.4 F (36.3 C) (Tympanic)   Resp 16   Ht '5\' 3"'  (1.6 m) Comment: stated wt  Wt 173 lb 9.6 oz (78.7 kg)  BMI 30.75 kg/m  Patient has large breasts with bilateral implants.  Incision of the left breast is well-healed no dominant masses noted in either breast.  No axillary or supraclavicular adenopathy is identified.  Well-developed well-nourished patient in NAD. HEENT reveals PERLA, EOMI, discs not visualized.  Oral cavity is clear. No oral mucosal lesions are identified. Neck is clear without evidence of cervical or supraclavicular adenopathy. Lungs are clear to A&P. Cardiac examination is essentially unremarkable with regular rate and rhythm without murmur rub or thrill. Abdomen is benign with  no organomegaly or masses noted. Motor sensory and DTR levels are equal and symmetric in the upper and lower extremities. Cranial nerves II through XII are grossly intact. Proprioception is intact. No peripheral adenopathy or edema is identified. No motor or sensory levels are noted. Crude visual fields are within normal range.  LABORATORY DATA: Pathology reports reviewed    RADIOLOGY RESULTS: Mammogram and ultrasound reviewed compatible with above-stated findings   IMPRESSION: ER positive ductal carcinoma in situ of the left breast status post wide local excision with a small focus of invasive carcinoma microinvasion on initial biopsy in 51 year old female  PLAN: At this time I have recommended going ahead with whole breast radiation.  Based on her large volume of breast and implants would treat 5040 cGy in 28 fractions.  Would also boost her scar another 1600 centigrade based on the close DCIS margin of 1 mm.  Risks and benefits of treatment including skin reaction fatigue alteration of blood counts possible psych slight contraction around her implants possible inclusion of superficial lung all were reviewed in detail with the patient.  I have personally set up and ordered CT simulation for next week.  She will also benefit from antiestrogen therapy after completion of radiation.  Patient comprehends my recommendations well.  I have discussed the case personally with medical oncology who agrees.  I would like to take this opportunity to thank you for allowing me to participate in the care of your patient.Noreene Filbert, MD

## 2021-11-27 ENCOUNTER — Telehealth: Payer: Self-pay

## 2021-11-27 NOTE — Telephone Encounter (Signed)
Received FMLA paperwork for patient. Since she is only getting radiation and has her first appointment with Rad onc on 5/31, I have forwarded the FMLA paperwork to rad onc.

## 2021-11-28 ENCOUNTER — Other Ambulatory Visit: Payer: Self-pay

## 2021-11-28 MED ORDER — FLUCONAZOLE 150 MG PO TABS
ORAL_TABLET | ORAL | 0 refills | Status: DC
  Filled 2021-11-28: qty 2, 3d supply, fill #0

## 2021-12-03 ENCOUNTER — Ambulatory Visit: Admission: RE | Admit: 2021-12-03 | Payer: 59 | Source: Ambulatory Visit

## 2021-12-03 DIAGNOSIS — D0512 Intraductal carcinoma in situ of left breast: Secondary | ICD-10-CM | POA: Insufficient documentation

## 2021-12-03 DIAGNOSIS — Z170 Estrogen receptor positive status [ER+]: Secondary | ICD-10-CM | POA: Diagnosis not present

## 2021-12-04 ENCOUNTER — Other Ambulatory Visit: Payer: Self-pay | Admitting: *Deleted

## 2021-12-04 DIAGNOSIS — D0512 Intraductal carcinoma in situ of left breast: Secondary | ICD-10-CM

## 2021-12-05 DIAGNOSIS — Z170 Estrogen receptor positive status [ER+]: Secondary | ICD-10-CM | POA: Diagnosis not present

## 2021-12-05 DIAGNOSIS — D0512 Intraductal carcinoma in situ of left breast: Secondary | ICD-10-CM | POA: Insufficient documentation

## 2021-12-08 ENCOUNTER — Ambulatory Visit: Payer: 59 | Attending: General Surgery | Admitting: Physical Therapy

## 2021-12-08 ENCOUNTER — Encounter: Payer: Self-pay | Admitting: Physical Therapy

## 2021-12-08 ENCOUNTER — Other Ambulatory Visit: Payer: Self-pay

## 2021-12-08 ENCOUNTER — Other Ambulatory Visit: Payer: Self-pay | Admitting: Internal Medicine

## 2021-12-08 DIAGNOSIS — Z483 Aftercare following surgery for neoplasm: Secondary | ICD-10-CM | POA: Diagnosis not present

## 2021-12-08 DIAGNOSIS — M25612 Stiffness of left shoulder, not elsewhere classified: Secondary | ICD-10-CM | POA: Diagnosis not present

## 2021-12-08 DIAGNOSIS — C50912 Malignant neoplasm of unspecified site of left female breast: Secondary | ICD-10-CM | POA: Diagnosis not present

## 2021-12-08 DIAGNOSIS — R293 Abnormal posture: Secondary | ICD-10-CM | POA: Diagnosis not present

## 2021-12-08 DIAGNOSIS — Z170 Estrogen receptor positive status [ER+]: Secondary | ICD-10-CM | POA: Insufficient documentation

## 2021-12-08 NOTE — Therapy (Signed)
OUTPATIENT PHYSICAL THERAPY BREAST CANCER POST OP FOLLOW UP   Patient Name: [name] MRN: [id number] DOB:1971/04/10, 51 y.o., female Today's Date: 12/08/2021   PT End of Session - 12/08/21 1455     Visit Number 2    Number of Visits 10    Date for PT Re-Evaluation 01/05/22    PT Start Time 1455    PT Stop Time 1554    PT Time Calculation (min) 59 min    Activity Tolerance Patient tolerated treatment well    Behavior During Therapy [facility] for tasks assessed/performed             Past Medical History:  Diagnosis Date   Asthma    followed by pcp---  pt stated mild seasonal asthma   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   Female cystocele    symptomatic   GAD (generalized anxiety disorder)    GERD (gastroesophageal reflux disease)    Hiatal hernia    History of 2019 novel coronavirus disease (COVID-19) 07/2020   per pt had mild symptoms that resolved   MDD (major depressive disorder)    Mixed incontinence urge and stress    Wears glasses    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[place])    [facility]---  dx 2015,  takes methotrexate wkly, pt stated she is stable   Past Surgical History:  Procedure Laterality Date   ANTERIOR AND POSTERIOR REPAIR N/A 01/14/2021   Procedure: ANTERIOR (CYSTOCELE) AND POSTERIOR REPAIR (RECTOCELE);  Surgeon: Cheri Fowler, MD;  Location: [facility];  Service: Gynecology;  Laterality: N/A;   AUGMENTATION MAMMAPLASTY Bilateral 2010   saline   BREAST BIOPSY Left 10/23/2021   Stereo Bx, X-clip, path pending   BREAST ENHANCEMENT SURGERY  2009   BREAST LUMPECTOMY WITH RADIOACTIVE SEED AND SENTINEL LYMPH NODE BIOPSY Left 11/12/2021   Procedure: LEFT BREAST LUMPECTOMY WITH RADIOACTIVE SEED AND SENTINEL LYMPH NODE BIOPSY;  Surgeon: Stark Klein, MD;  Location: [facility];  Service: General;  Laterality: Left;   CARPAL TUNNEL RELEASE Right 2011   LAPAROSCOPIC  SUPRACERVICAL HYSTERECTOMY  02/21/2010   @ [place] by Dr Willis Modena   NASAL SEPTUM SURGERY  01/2018   insertion button prosthesis for septal perforation   RHINOPLASTY  2005   nasal fracture   Patient Active Problem List   Diagnosis Date Noted   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  09/13/2020   Cervix prolapsed into vagina 09/13/2020   Dysuria 09/13/2020   Genetic testing 09/10/2020   Anxiety 06/18/2020   Mild intermittent asthma without complication 56/25/6389   Bloody diarrhea 06/09/2016   Major depressive disorder, recurrent, severe without psychotic features ([place])    Severe recurrent major depression w/psychotic features, mood-congruent ([place]) 05/04/2014   Major depression 05/04/2014   Positive ANA (antinuclear antibody) 04/10/2014   Granulomatosis with polyangiitis with pulmonary involvement ([place]) 02/07/2014   Generalized anxiety disorder 02/07/2014   Insomnia 02/07/2014   Sensorineural hearing loss of right ear 12/06/2013   Dermatitis 12/05/2013   Nasal septal perforation 12/05/2013   Allergic rhinitis 10/12/2006   Asthma 10/12/2006   GERD 10/12/2006    PCP: [name], MD  REFERRING PROVIDER: Stark Klein, MD   REFERRING DIAG:  C50.919 (ICD-10-CM) - Invasive carcinoma of breast ([place])  THERAPY DIAG:  Abnormal posture  Malignant neoplasm of left breast in  female, estrogen receptor positive, unspecified site of breast [facility])  Rationale for Evaluation and Treatment Rehabilitation  ONSET DATE: 10/23/21  SUBJECTIVE:                                                                                                                                                                                           SUBJECTIVE STATEMENT: I have this cording and a knot in my forearm but if I touch it or rub it it shoots up my arm. I did not notice it in the  beginning. The first 3 fingers are numb and it is worse if I am sitting still for a long time or if I hold my phone.   PERTINENT HISTORY:  Patient was diagnosed on 10/23/21 with left DCIS (grade to be determined later). It measures 1.65m and is located in the upper outer quadrant. It is ER/PR +, HER2 -. Underwent a L lumpectomy and SLNB (0/3) on 11/12/21, will require radiation  PATIENT GOALS:  Reassess how my recovery is going related to arm function, pain, and swelling.  PAIN:  Are you having pain? No  PRECAUTIONS: Recent Surgery, left UE Lymphedema risk,   ACTIVITY LEVEL / LEISURE: pt [place] on Wednesday   OBJECTIVE:   PATIENT SURVEYS:  QUICK DASH:  Quick Dash - 12/08/21 0001     Open a tight or new jar Moderate difficulty    Do heavy household chores (wash walls, wash floors) Moderate difficulty    Carry a shopping bag or briefcase Mild difficulty    Wash your back Severe difficulty    Use a knife to cut food Mild difficulty    Recreational activities in which you take some force or impact through your arm, shoulder, or hand (golf, hammering, tennis) Moderate difficulty    During the past week, to what extent has your arm, shoulder or hand problem interfered with your normal social activities with family, friends, neighbors, or groups? Slightly    During the past week, to what extent has your arm, shoulder or hand problem limited your work or other regular daily activities Modererately    Arm, shoulder, or hand pain. Moderate    Tingling (pins and needles) in your arm, shoulder, or hand Severe    Difficulty Sleeping Moderate difficulty    DASH Score 47.73 %              OBSERVATIONS:  Pt having difficulty straightening elbow when arm is out to the side. Cording palpable from antecubital fossa extending to axilla.   POSTURE:  Forward head  LYMPHEDEMA ASSESSMENT:   UPPER EXTREMITY AROM/PROM:    A/PROM RIGHT  11/11/2021    Shoulder extension 75  Shoulder flexion 169  Shoulder abduction 168  Shoulder internal rotation 62  Shoulder external rotation 88                          (Blank rows = not tested)   A/PROM LEFT  11/11/2021 L 12/08/21  Shoulder extension 58 65  Shoulder flexion 172 125  Shoulder abduction 165 99  Shoulder internal rotation 70 68  Shoulder external rotation 88 68                          (Blank rows = not tested)     CERVICAL AROM: All within normal limits:      Percent limited  Flexion WFL  Extension WFL  Right lateral flexion WFL  Left lateral flexion WFL  Right rotation WFL  Left rotation WFL        UPPER EXTREMITY STRENGTH: 5/5     LYMPHEDEMA ASSESSMENTS:    LANDMARK RIGHT  11/11/2021  10 cm proximal to olecranon process 28.5  Olecranon process 27  10 cm proximal to ulnar styloid process 20.5  Just proximal to ulnar styloid process 15.2  Across hand at thumb web space 19.5  At base of 2nd digit 6.6  (Blank rows = not tested)   LANDMARK LEFT  11/11/2021 L 12/08/21  10 cm proximal to olecranon process 28.5 29  Olecranon process 26 25.3  10 cm proximal to ulnar styloid process 21.4 20.4  Just proximal to ulnar styloid process 15.3 14.8  Across hand at thumb web space 18 17.5  At base of 2nd digit 6.4 6.1  (Blank rows = not tested)       Surgery type/Date: 11/12/21- L lumpectomy and SLNB (0/3) Number of lymph nodes removed: 0/3 Current/past treatment (chemo, radiation, hormone therapy): will require radiation Other symptoms:  Heaviness/tightness No Pain No Pitting edema No Infections No Decreased scar mobility No Stemmer sign No   PATIENT EDUCATION:  Education details: axillary cording, scar mobilization, median nerve stretch Person educated: Patient Education method: Explanation and Handouts Education comprehension: verbalized understanding   HOME EXERCISE PROGRAM:  Reviewed previously given post op  HEP.  TREATMENT:   12/08/21: Manual therapy: in supine myofascial release to cording extending from antecubital fossa to axilla with approximately 8 releases noted during session today. Pt felt significantly better with improved ROM by end of session.  ASSESSMENT:  CLINICAL IMPRESSION: Pt returns to PT after undergoing a L lumpectomy and SLNB on 11/12/21. She developed a seroma post surgery and took antibiotics which improved it. Pt has limited L shoulder ROM and cording extending from axilla past antecubital fossa that make it difficult for her to extend her elbow. She does not demonstrate any swelling. Began myofascial release to cording today with numerous releases noted today. By end of session pt demonstrated significant improvements with ROM and increased comfort. Pt would benefit from skilled PT services to improve L shoulder ROM and decrease cording to allow pt to return to PLOF.   Pt will benefit from skilled therapeutic intervention to improve on the following deficits: Decreased knowledge of precautions, impaired UE functional use, pain, decreased ROM, postural dysfunction.   PT treatment/interventions: ADL/Self care home management, Therapeutic exercises, Patient/Family education, Orthotic/Fit training, Manual lymph drainage, Compression bandaging, Vasopneumatic device, and Manual therapy     GOALS:  Goals reviewed with patient? Yes  LONG TERM GOALS:  (STG=LTG)  GOALS Name Target Date  Goal status  1 Pt will demonstrate she has regained full shoulder ROM and function post operatively compared to baselines.  Baseline: 01/05/2022 INITIAL  2 Pt will demonstrate 170 degrees of L shoulder flexion to allow pt to reach overhead.  Baseline: 125 01/05/2022 INITIAL  3 Pt will demonstrate 165 degrees of L shoulder abduction to allow pt to reach out to the side. Baseline: 99 01/05/2022 INITIAL  4 Pt will report she is able to raise arm to end range in all directions without discomfort from  cording. 01/05/2022 INITIAL     PLAN: PT FREQUENCY/DURATION: 2x/wk for 4 wks  PLAN FOR NEXT SESSION: continue myofascial release for cording, PROM to L shoulder, MLD if needed   [facility]  [address], [address]  [place] [address]  [phone]  After Breast Cancer Class It is recommended you attend the ABC class to be educated on lymphedema risk reduction. This class is free of charge and lasts for 1 hour. It is a 1-time class. You will need to download the Webex app either on your phone or computer. We will send you a link the night before or the morning of the class. You should be able to click on that link to join the class. This is not a confidential class. You don't have to turn your camera on, but other participants may be able to see your email address.  Scar massage You can begin gentle scar massage to you incision sites. Gently place one hand on the incision and move the skin (without sliding on the skin) in various directions. Do this for a few minutes and then you can gently massage either coconut oil or vitamin E cream into the scars.  Compression garment You should continue wearing your compression bra until you feel like you no longer have swelling.  Home exercise Program Continue doing the exercises you were given until you feel like you can do them without feeling any tightness at the end.   Walking Program Studies show that 30 minutes of walking per day (fast enough to elevate your heart rate) can significantly reduce the risk of a cancer recurrence. If you can't walk due to other medical reasons, we encourage you to find another activity you could do (like a stationary bike or water exercise).  Posture After breast cancer surgery, people frequently sit with rounded shoulders posture because it puts their incisions on slack and feels better. If you sit like this and scar tissue forms in that position, you can become very tight and have pain  sitting or standing with good posture. Try to be aware of your posture and sit and stand up tall to heal properly.  Follow up PT: It is recommended you return every 3 months for the first 3 years following surgery to be assessed on the SOZO machine for an L-Dex score. This helps prevent clinically significant lymphedema in 95% of patients. These follow up screens are 10 minute appointments that you are not billed for.  Allyson Sabal Moosic, PT 12/08/2021, 3:57 PM

## 2021-12-09 ENCOUNTER — Ambulatory Visit: Admission: RE | Admit: 2021-12-09 | Payer: 59 | Source: Ambulatory Visit

## 2021-12-09 DIAGNOSIS — D0512 Intraductal carcinoma in situ of left breast: Secondary | ICD-10-CM | POA: Diagnosis not present

## 2021-12-09 DIAGNOSIS — Z170 Estrogen receptor positive status [ER+]: Secondary | ICD-10-CM | POA: Diagnosis not present

## 2021-12-10 ENCOUNTER — Other Ambulatory Visit: Payer: Self-pay

## 2021-12-10 ENCOUNTER — Ambulatory Visit
Admission: RE | Admit: 2021-12-10 | Discharge: 2021-12-10 | Disposition: A | Discharge status: Home / Self Care | Payer: 59 | Source: Ambulatory Visit | Attending: Radiation Oncology | Admitting: Radiation Oncology

## 2021-12-10 DIAGNOSIS — D0512 Intraductal carcinoma in situ of left breast: Secondary | ICD-10-CM | POA: Diagnosis not present

## 2021-12-10 DIAGNOSIS — Z170 Estrogen receptor positive status [ER+]: Secondary | ICD-10-CM | POA: Diagnosis not present

## 2021-12-10 LAB — RAD ONC ARIA SESSION SUMMARY
Course Elapsed Days: 0
Plan Fractions Treated to Date: 1
Plan Prescribed Dose Per Fraction: 1.8 Gy
Plan Total Fractions Prescribed: 28
Plan Total Prescribed Dose: 50.4 Gy
Reference Point Dosage Given to Date: 1.8 Gy
Reference Point Session Dosage Given: 1.8 Gy
Session Number: 1

## 2021-12-11 ENCOUNTER — Other Ambulatory Visit: Payer: Self-pay

## 2021-12-11 ENCOUNTER — Encounter (HOSPITAL_COMMUNITY): Payer: Self-pay

## 2021-12-11 ENCOUNTER — Ambulatory Visit
Admission: RE | Admit: 2021-12-11 | Discharge: 2021-12-11 | Disposition: A | Discharge status: Home / Self Care | Payer: 59 | Source: Ambulatory Visit | Attending: Radiation Oncology | Admitting: Radiation Oncology

## 2021-12-11 DIAGNOSIS — D0512 Intraductal carcinoma in situ of left breast: Secondary | ICD-10-CM | POA: Diagnosis not present

## 2021-12-11 DIAGNOSIS — Z170 Estrogen receptor positive status [ER+]: Secondary | ICD-10-CM | POA: Diagnosis not present

## 2021-12-11 LAB — RAD ONC ARIA SESSION SUMMARY
Course Elapsed Days: 1
Plan Fractions Treated to Date: 2
Plan Prescribed Dose Per Fraction: 1.8 Gy
Plan Total Fractions Prescribed: 28
Plan Total Prescribed Dose: 50.4 Gy
Reference Point Dosage Given to Date: 3.6 Gy
Reference Point Session Dosage Given: 1.8 Gy
Session Number: 2

## 2021-12-12 ENCOUNTER — Ambulatory Visit
Admission: RE | Admit: 2021-12-12 | Discharge: 2021-12-12 | Disposition: A | Discharge status: Home / Self Care | Payer: 59 | Source: Ambulatory Visit | Attending: Radiation Oncology | Admitting: Radiation Oncology

## 2021-12-12 ENCOUNTER — Other Ambulatory Visit: Payer: Self-pay

## 2021-12-12 DIAGNOSIS — D0512 Intraductal carcinoma in situ of left breast: Secondary | ICD-10-CM | POA: Diagnosis not present

## 2021-12-12 DIAGNOSIS — Z170 Estrogen receptor positive status [ER+]: Secondary | ICD-10-CM | POA: Diagnosis not present

## 2021-12-12 LAB — RAD ONC ARIA SESSION SUMMARY
Course Elapsed Days: 2
Plan Fractions Treated to Date: 3
Plan Prescribed Dose Per Fraction: 1.8 Gy
Plan Total Fractions Prescribed: 28
Plan Total Prescribed Dose: 50.4 Gy
Reference Point Dosage Given to Date: 5.4 Gy
Reference Point Session Dosage Given: 1.8 Gy
Session Number: 3

## 2021-12-15 ENCOUNTER — Ambulatory Visit
Admission: RE | Admit: 2021-12-15 | Discharge: 2021-12-15 | Disposition: A | Discharge status: Home / Self Care | Payer: 59 | Source: Ambulatory Visit | Attending: Radiation Oncology | Admitting: Radiation Oncology

## 2021-12-15 ENCOUNTER — Ambulatory Visit: Payer: 59 | Admitting: Physical Therapy

## 2021-12-15 ENCOUNTER — Other Ambulatory Visit: Payer: Self-pay

## 2021-12-15 ENCOUNTER — Encounter: Payer: Self-pay | Admitting: Physical Therapy

## 2021-12-15 DIAGNOSIS — Z483 Aftercare following surgery for neoplasm: Secondary | ICD-10-CM | POA: Diagnosis not present

## 2021-12-15 DIAGNOSIS — C50912 Malignant neoplasm of unspecified site of left female breast: Secondary | ICD-10-CM | POA: Diagnosis not present

## 2021-12-15 DIAGNOSIS — M25612 Stiffness of left shoulder, not elsewhere classified: Secondary | ICD-10-CM

## 2021-12-15 DIAGNOSIS — R293 Abnormal posture: Secondary | ICD-10-CM | POA: Diagnosis not present

## 2021-12-15 DIAGNOSIS — Z170 Estrogen receptor positive status [ER+]: Secondary | ICD-10-CM | POA: Diagnosis not present

## 2021-12-15 DIAGNOSIS — D0512 Intraductal carcinoma in situ of left breast: Secondary | ICD-10-CM | POA: Diagnosis not present

## 2021-12-15 LAB — RAD ONC ARIA SESSION SUMMARY
Course Elapsed Days: 5
Plan Fractions Treated to Date: 4
Plan Prescribed Dose Per Fraction: 1.8 Gy
Plan Total Fractions Prescribed: 28
Plan Total Prescribed Dose: 50.4 Gy
Reference Point Dosage Given to Date: 7.2 Gy
Reference Point Session Dosage Given: 1.8 Gy
Session Number: 4

## 2021-12-15 NOTE — Patient Instructions (Signed)
First of all, check with your insurance company to see if provider is in [place] (for wigs and compression sleeves / gloves/gauntlets )   [address] [place], [place] Phone: [phone]   Fax: [phone] Will file some insurances --- call for appointment   Second to [facility] (for mastectomy prosthetics and garments) [address] Suite [place], [place]  Phone: [phone]  Fax: [phone] Will file some insurances --- call for appointment  [facility]  [address]  [place], [place] [address] [phone] Lower extremity garments  [facility] [address] [place], [place]  [address] [place] [facility] (for compression garments, especilly for lower extremities) [address], [place], [place]  [address] [phone] Call for appointment    [employer] (for mastectomy supplies and garments) [place] [address], [place] [address] [phone]  Other Resources: National Lymphedema Network:  www.lymphnet.org www.Klosetraining.com for patient articles and self manual lymph drainage information www.lymphedemablog.com has informative articles.  DishTag.es.com www.lymphedemaproducts.com www.brightlifedirect.com   Added exercise; Lower trunk rotation Side lying "open the book"

## 2021-12-15 NOTE — Therapy (Signed)
OUTPATIENT PHYSICAL THERAPY BREAST CANCER POST OP FOLLOW UP     Patient Name: [name] MRN: [id number] DOB:1971/05/29, 51 y.o., female Today's Date: 12/08/2021     PT End of Session - 12/08/21 1455       Visit Number 3    Number of Visits 10     Date for PT Re-Evaluation 01/05/22     PT Start Time 1455     PT Stop Time 1554     PT Time Calculation (min) 59 min     Activity Tolerance Patient tolerated treatment well     Behavior During Therapy [facility] for tasks assessed/performed                       Past Medical History:  Diagnosis Date   Asthma      followed by pcp---  pt stated mild seasonal asthma   Cancer ([place]) 10/2021    left breast DCIS   Female cystocele      symptomatic   GAD (generalized anxiety disorder)     GERD (gastroesophageal reflux disease)     Hiatal hernia     History of 2019 novel coronavirus disease (COVID-19) 07/2020    per pt had mild symptoms that resolved   MDD (major depressive disorder)     Mixed incontinence urge and stress     Wears glasses     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[place])      [facility]---  dx 2015,  takes methotrexate wkly, pt stated she is stable         Past Surgical History:  Procedure Laterality Date   ANTERIOR AND POSTERIOR REPAIR N/A 01/14/2021    Procedure: ANTERIOR (CYSTOCELE) AND POSTERIOR REPAIR (RECTOCELE);  Surgeon: Cheri Fowler, MD;  Location: [facility];  Service: Gynecology;  Laterality: N/A;   AUGMENTATION MAMMAPLASTY Bilateral 2010    saline   BREAST BIOPSY Left 10/23/2021    Stereo Bx, X-clip, path pending   BREAST ENHANCEMENT SURGERY   2009   BREAST LUMPECTOMY WITH RADIOACTIVE SEED AND SENTINEL LYMPH NODE BIOPSY Left 11/12/2021    Procedure: LEFT BREAST LUMPECTOMY WITH RADIOACTIVE SEED AND SENTINEL LYMPH NODE BIOPSY;  Surgeon: Stark Klein, MD;  Location: [place];  Service: General;  Laterality: Left;   CARPAL  TUNNEL RELEASE Right 2011   LAPAROSCOPIC SUPRACERVICAL HYSTERECTOMY   02/21/2010    @ [facility] by Dr Willis Modena   NASAL SEPTUM SURGERY   01/2018    insertion button prosthesis for septal perforation   RHINOPLASTY   2005    nasal fracture        Patient Active Problem List    Diagnosis Date Noted   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  09/13/2020   Cervix prolapsed into vagina 09/13/2020   Dysuria 09/13/2020   Genetic testing 09/10/2020   Anxiety 06/18/2020   Mild intermittent asthma without complication 46/50/3546   Bloody diarrhea 06/09/2016   Major depressive disorder, recurrent, severe without psychotic features ([place])     Severe recurrent major depression w/psychotic features, mood-congruent ([place]) 05/04/2014   Major depression 05/04/2014   Positive ANA (antinuclear antibody) 04/10/2014   Granulomatosis with polyangiitis with pulmonary involvement ([place]) 02/07/2014   Generalized anxiety disorder 02/07/2014   Insomnia 02/07/2014   Sensorineural hearing loss of right ear 12/06/2013  Dermatitis 12/05/2013   Nasal septal perforation 12/05/2013   Allergic rhinitis 10/12/2006   Asthma 10/12/2006   GERD 10/12/2006      PCP: [name], MD   REFERRING PROVIDER: Stark Klein, MD    REFERRING DIAG:  C50.919 (ICD-10-CM) - Invasive carcinoma of breast ([place])   THERAPY DIAG:  Abnormal posture   Malignant neoplasm of left breast in female, estrogen receptor positive, unspecified site of breast [facility])   Rationale for Evaluation and Treatment Rehabilitation   ONSET DATE: 10/23/21   SUBJECTIVE:                                                                                                                                                                                            SUBJECTIVE STATEMENT: Pt states she can extend her arm all the way out now and the  swelling is mostly gone except for right above her elbow   PERTINENT HISTORY:  Patient was diagnosed on 10/23/21 with left DCIS (grade to be determined later). It measures 1.22mm and is located in the upper outer quadrant. It is ER/PR +, HER2 -. Underwent a L lumpectomy and SLNB (0/3) on 11/12/21, will require radiation   PATIENT GOALS:  Reassess how my recovery is going related to arm function, pain, and swelling.   PAIN:  Are you having pain? 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     OBJECTIVE:    PATIENT SURVEYS:  QUICK DASH:   Quick Dash - 12/08/21 0001       Open a tight or new jar Moderate difficulty     Do heavy household chores (wash walls, wash floors) Moderate difficulty     Carry a shopping bag or briefcase Mild difficulty     Wash your back Severe difficulty     Use a knife to cut food Mild difficulty     Recreational activities in which you take some force or impact through your arm, shoulder, or hand (golf, hammering, tennis) Moderate difficulty     During the past week, to what extent has your arm, shoulder or hand problem interfered with your normal social activities with family, friends, neighbors, or groups? Slightly     During the past week, to what extent has your arm, shoulder or hand problem limited your work or other regular daily activities Modererately     Arm, shoulder, or hand pain. Moderate     Tingling (pins and needles) in your arm, shoulder, or hand  Severe     Difficulty Sleeping Moderate difficulty     DASH Score 47.73 %                     OBSERVATIONS:          Pt has a small area of swelling just above medial left elbow with a small firmer area that may be a lymph node?   She also has a small round open area on breast with yellow eschar visible not warmness or redness surrounding it. She says her doctors  area aware of it.  POSTURE:  Forward head   LYMPHEDEMA ASSESSMENT:    UPPER EXTREMITY AROM/PROM:   A/PROM RIGHT  11/11/2021    Shoulder extension 75  Shoulder flexion 169  Shoulder abduction 168  Shoulder internal rotation 62  Shoulder external rotation 88                          (Blank rows = not tested)   A/PROM LEFT  11/11/2021 L 12/08/21 L 12/15/21  Shoulder extension 58 65   Shoulder flexion 172 125 175  Shoulder abduction 165 99 174 ( cording plg visible and palpabe at axilla   Shoulder internal rotation 70 68   Shoulder external rotation 88 68 90                          (Blank rows = not tested)     CERVICAL AROM: All within normal limits:      Percent limited  Flexion WFL  Extension WFL  Right lateral flexion WFL  Left lateral flexion WFL  Right rotation WFL  Left rotation WFL        UPPER EXTREMITY STRENGTH: 5/5     LYMPHEDEMA ASSESSMENTS:    LANDMARK RIGHT  11/11/2021  10 cm proximal to olecranon process 28.5  Olecranon process 27  10 cm proximal to ulnar styloid process 20.5  Just proximal to ulnar styloid process 15.2  Across hand at thumb web space 19.5  At base of 2nd digit 6.6  (Blank rows = not tested)   LANDMARK LEFT  11/11/2021 L 12/08/21  10 cm proximal to olecranon process 28.5 29  Olecranon process 26 25.3  10 cm proximal to ulnar styloid process 21.4 20.4  Just proximal to ulnar styloid process 15.3 14.8  Across hand at thumb web space 18 17.5  At base of 2nd digit 6.4 6.1  (Blank rows = not tested)                    Surgery type/Date: 11/12/21- L lumpectomy and SLNB (0/3) Number of lymph nodes removed: 0/3 Current/past treatment (chemo, radiation, hormone therapy): will require radiation Other symptoms:  Heaviness/tightness No Pain No Pitting edema No Infections No Decreased scar mobility No Stemmer sign No     PATIENT EDUCATION:  Education details: added lower trunk rotation and open book stretch with elbow bent.  Showed  compression bras and where to get them, try tg soft on elbow  Person educated: Patient Education method: Explanation and Handouts Education comprehension: verbalized understanding     HOME EXERCISE PROGRAM:            Reviewed previously given post op HEP. Added lower trunk rotation and open book with folded arm    TREATMENT: Today's treatment 12/15/21:   AROM to neck and shoulders in supine and sidelying.   MLD  and manual stretching to cording in left forearm and arm to axilla and lateral shoulder.  Pt is currently undergoing radiations and was given information about getting a light compression or radiation bra from [employer] as options. Also give a piece of tg soft to see if that would help with the fullness at elbow.                 12/08/21: Manual therapy: in supine myofascial release to cording extending from antecubital fossa to axilla with approximately 8 releases noted during session today. Pt felt significantly better with improved ROM by end of session.   ASSESSMENT:   CLINICAL IMPRESSION:  Pt has improved in shoulder and elbow ROM with less cording symptoms since last session though she does have some visible above elbow and axilla.  She has achieved her goals for shoulder ROM.  She will continue to do AROM at home. And try the tg soft to see if that will help her decrease the swelling above her elbow     Pt will benefit from skilled therapeutic intervention to improve on the following deficits: Decreased knowledge of precautions, impaired UE functional use, pain, decreased ROM, postural dysfunction.    PT treatment/interventions: ADL/Self care home management, Therapeutic exercises, Patient/Family education, Orthotic/Fit training, Manual lymph drainage, Compression bandaging, Vasopneumatic device, and Manual therapy         GOALS: Goals reviewed with patient? Yes   LONG TERM GOALS:  (STG=LTG)   GOALS Name Target Date   Goal status  1 Pt will demonstrate she has  regained full shoulder ROM and function post operatively compared to baselines.  Baseline: 01/05/2022 MET  2 Pt will demonstrate 170 degrees of L shoulder flexion to allow pt to reach overhead.  Baseline: 125 01/05/2022 MET  3 Pt will demonstrate 165 degrees of L shoulder abduction to allow pt to reach out to the side. Baseline: 99 01/05/2022 MET  4 Pt will report she is able to raise arm to end range in all directions without discomfort from cording. 01/05/2022 INITIAL        PLAN: PT FREQUENCY/DURATION: 2x/wk for 4 wks   PLAN FOR NEXT SESSION: continue myofascial release for cording, PROM to L shoulder, MLD if needed continue with exercise progression . Did tg soft help at all?      Maudry Diego, PT 12/15/21 12:52 PM         Electronically signed by Wynelle Beckmann, Melodie Bouillon, PT at 12/08/2021  4:07 PM Outpatient Rehab on 12/08/2021  Outpatient Rehab on 12/08/2021   Detailed Report  Note shared with patient Additional Documentation  Flowsheets:  PT End of Session, Quick Dash, 8 Minute

## 2021-12-16 ENCOUNTER — Ambulatory Visit
Admission: RE | Admit: 2021-12-16 | Discharge: 2021-12-16 | Disposition: A | Discharge status: Home / Self Care | Payer: 59 | Source: Ambulatory Visit | Attending: Radiation Oncology | Admitting: Radiation Oncology

## 2021-12-16 ENCOUNTER — Other Ambulatory Visit: Payer: 59

## 2021-12-16 ENCOUNTER — Other Ambulatory Visit: Payer: Self-pay

## 2021-12-16 ENCOUNTER — Inpatient Hospital Stay: Payer: 59 | Attending: Oncology

## 2021-12-16 DIAGNOSIS — D0512 Intraductal carcinoma in situ of left breast: Secondary | ICD-10-CM

## 2021-12-16 DIAGNOSIS — C50412 Malignant neoplasm of upper-outer quadrant of left female breast: Secondary | ICD-10-CM | POA: Insufficient documentation

## 2021-12-16 DIAGNOSIS — Z170 Estrogen receptor positive status [ER+]: Secondary | ICD-10-CM | POA: Diagnosis not present

## 2021-12-16 LAB — RAD ONC ARIA SESSION SUMMARY
Course Elapsed Days: 6
Plan Fractions Treated to Date: 5
Plan Prescribed Dose Per Fraction: 1.8 Gy
Plan Total Fractions Prescribed: 28
Plan Total Prescribed Dose: 50.4 Gy
Reference Point Dosage Given to Date: 9 Gy
Reference Point Session Dosage Given: 1.8 Gy
Session Number: 5

## 2021-12-16 LAB — CBC
HCT: 38.1 % (ref 36.0–46.0)
Hemoglobin: 12.4 g/dL (ref 12.0–15.0)
MCH: 28.8 pg (ref 26.0–34.0)
MCHC: 32.5 g/dL (ref 30.0–36.0)
MCV: 88.6 fL (ref 80.0–100.0)
Platelets: 224 10*3/uL (ref 150–400)
RBC: 4.3 MIL/uL (ref 3.87–5.11)
RDW: 15.6 % — ABNORMAL HIGH (ref 11.5–15.5)
WBC: 4.9 10*3/uL (ref 4.0–10.5)
nRBC: 0 % (ref 0.0–0.2)

## 2021-12-17 ENCOUNTER — Ambulatory Visit
Admission: RE | Admit: 2021-12-17 | Discharge: 2021-12-17 | Disposition: A | Discharge status: Home / Self Care | Payer: 59 | Source: Ambulatory Visit | Attending: Radiation Oncology | Admitting: Radiation Oncology

## 2021-12-17 ENCOUNTER — Other Ambulatory Visit: Payer: Self-pay

## 2021-12-17 DIAGNOSIS — D0512 Intraductal carcinoma in situ of left breast: Secondary | ICD-10-CM | POA: Diagnosis not present

## 2021-12-17 DIAGNOSIS — Z170 Estrogen receptor positive status [ER+]: Secondary | ICD-10-CM | POA: Diagnosis not present

## 2021-12-17 LAB — RAD ONC ARIA SESSION SUMMARY
Course Elapsed Days: 7
Plan Fractions Treated to Date: 6
Plan Prescribed Dose Per Fraction: 1.8 Gy
Plan Total Fractions Prescribed: 28
Plan Total Prescribed Dose: 50.4 Gy
Reference Point Dosage Given to Date: 10.8 Gy
Reference Point Session Dosage Given: 1.8 Gy
Session Number: 6

## 2021-12-18 ENCOUNTER — Ambulatory Visit
Admission: RE | Admit: 2021-12-18 | Discharge: 2021-12-18 | Disposition: A | Discharge status: Home / Self Care | Payer: 59 | Source: Ambulatory Visit | Attending: Radiation Oncology | Admitting: Radiation Oncology

## 2021-12-18 ENCOUNTER — Encounter: Payer: Self-pay | Admitting: Physical Therapy

## 2021-12-18 ENCOUNTER — Other Ambulatory Visit: Payer: Self-pay

## 2021-12-18 ENCOUNTER — Ambulatory Visit: Payer: 59 | Admitting: Physical Therapy

## 2021-12-18 DIAGNOSIS — M25612 Stiffness of left shoulder, not elsewhere classified: Secondary | ICD-10-CM | POA: Diagnosis not present

## 2021-12-18 DIAGNOSIS — R293 Abnormal posture: Secondary | ICD-10-CM

## 2021-12-18 DIAGNOSIS — Z170 Estrogen receptor positive status [ER+]: Secondary | ICD-10-CM | POA: Diagnosis not present

## 2021-12-18 DIAGNOSIS — C50912 Malignant neoplasm of unspecified site of left female breast: Secondary | ICD-10-CM | POA: Diagnosis not present

## 2021-12-18 DIAGNOSIS — Z483 Aftercare following surgery for neoplasm: Secondary | ICD-10-CM

## 2021-12-18 DIAGNOSIS — D0512 Intraductal carcinoma in situ of left breast: Secondary | ICD-10-CM | POA: Diagnosis not present

## 2021-12-18 LAB — RAD ONC ARIA SESSION SUMMARY
Course Elapsed Days: 8
Plan Fractions Treated to Date: 7
Plan Prescribed Dose Per Fraction: 1.8 Gy
Plan Total Fractions Prescribed: 28
Plan Total Prescribed Dose: 50.4 Gy
Reference Point Dosage Given to Date: 12.6 Gy
Reference Point Session Dosage Given: 1.8 Gy
Session Number: 7

## 2021-12-18 NOTE — Therapy (Signed)
OUTPATIENT PHYSICAL THERAPY BREAST CANCER POST OP FOLLOW UP     Patient Name: NASTASHIA GALLO MRN: [id number] DOB:1971/05/29, 51 y.o., female Today's Date: 12/08/2021     PT End of Session - 12/08/21 1455       Visit Number 3    Number of Visits 10     Date for PT Re-Evaluation 01/05/22     PT Start Time 1455     PT Stop Time 1554     PT Time Calculation (min) 59 min     Activity Tolerance Patient tolerated treatment well     Behavior During Therapy [facility] for tasks assessed/performed                       Past Medical History:  Diagnosis Date   Asthma      followed by pcp---  pt stated mild seasonal asthma   Cancer ([place]) 10/2021    left breast DCIS   Female cystocele      symptomatic   GAD (generalized anxiety disorder)     GERD (gastroesophageal reflux disease)     Hiatal hernia     History of 2019 novel coronavirus disease (COVID-19) 07/2020    per pt had mild symptoms that resolved   MDD (major depressive disorder)     Mixed incontinence urge and stress     Wears glasses     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[place])      [facility]---  dx 2015,  takes methotrexate wkly, pt stated she is stable         Past Surgical History:  Procedure Laterality Date   ANTERIOR AND POSTERIOR REPAIR N/A 01/14/2021    Procedure: ANTERIOR (CYSTOCELE) AND POSTERIOR REPAIR (RECTOCELE);  Surgeon: Cheri Fowler, MD;  Location: [facility];  Service: Gynecology;  Laterality: N/A;   AUGMENTATION MAMMAPLASTY Bilateral 2010    saline   BREAST BIOPSY Left 10/23/2021    Stereo Bx, X-clip, path pending   BREAST ENHANCEMENT SURGERY   2009   BREAST LUMPECTOMY WITH RADIOACTIVE SEED AND SENTINEL LYMPH NODE BIOPSY Left 11/12/2021    Procedure: LEFT BREAST LUMPECTOMY WITH RADIOACTIVE SEED AND SENTINEL LYMPH NODE BIOPSY;  Surgeon: Stark Klein, MD;  Location: [place];  Service: General;  Laterality: Left;   CARPAL  TUNNEL RELEASE Right 2011   LAPAROSCOPIC SUPRACERVICAL HYSTERECTOMY   02/21/2010    @ [facility] by Dr Willis Modena   NASAL SEPTUM SURGERY   01/2018    insertion button prosthesis for septal perforation   RHINOPLASTY   2005    nasal fracture        Patient Active Problem List    Diagnosis Date Noted   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  09/13/2020   Cervix prolapsed into vagina 09/13/2020   Dysuria 09/13/2020   Genetic testing 09/10/2020   Anxiety 06/18/2020   Mild intermittent asthma without complication 46/50/3546   Bloody diarrhea 06/09/2016   Major depressive disorder, recurrent, severe without psychotic features ([place])     Severe recurrent major depression w/psychotic features, mood-congruent ([place]) 05/04/2014   Major depression 05/04/2014   Positive ANA (antinuclear antibody) 04/10/2014   Granulomatosis with polyangiitis with pulmonary involvement ([place]) 02/07/2014   Generalized anxiety disorder 02/07/2014   Insomnia 02/07/2014   Sensorineural hearing loss of right ear 12/06/2013  Dermatitis 12/05/2013   Nasal septal perforation 12/05/2013   Allergic rhinitis 10/12/2006   Asthma 10/12/2006   GERD 10/12/2006      PCP: [name], MD   REFERRING PROVIDER: Stark Klein, MD    REFERRING DIAG:  C50.919 (ICD-10-CM) - Invasive carcinoma of breast ([place])   THERAPY DIAG:  Abnormal posture   Malignant neoplasm of left breast in female, estrogen receptor positive, unspecified site of breast [facility])   Rationale for Evaluation and Treatment Rehabilitation   ONSET DATE: 10/23/21   SUBJECTIVE:                                                                                                                                                                                            SUBJECTIVE STATEMENT: My arm has been swelling some since Tuesday. I can tell  because my watch it fitting more tightly.  PERTINENT HISTORY:  Patient was diagnosed on 10/23/21 with left DCIS (grade to be determined later). It measures 1.70mm and is located in the upper outer quadrant. It is ER/PR +, HER2 -. Underwent a L lumpectomy and SLNB (0/3) on 11/12/21, will require radiation   PATIENT GOALS:  Reassess how my recovery is going related to arm function, pain, and swelling.   PAIN:  Are you having pain? 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     OBJECTIVE:    PATIENT SURVEYS:  QUICK DASH:   Quick Dash - 12/08/21 0001       Open a tight or new jar Moderate difficulty     Do heavy household chores (wash walls, wash floors) Moderate difficulty     Carry a shopping bag or briefcase Mild difficulty     Wash your back Severe difficulty     Use a knife to cut food Mild difficulty     Recreational activities in which you take some force or impact through your arm, shoulder, or hand (golf, hammering, tennis) Moderate difficulty     During the past week, to what extent has your arm, shoulder or hand problem interfered with your normal social activities with family, friends, neighbors, or groups? Slightly     During the past week, to what extent has your arm, shoulder or hand problem limited your work or other regular daily activities Modererately     Arm, shoulder, or hand pain. Moderate     Tingling (pins and needles) in your arm, shoulder, or hand Severe     Difficulty Sleeping  Moderate difficulty     DASH Score 47.73 %                     OBSERVATIONS:          Pt has a small area of swelling just above medial left elbow with a small firmer area that may be a lymph node?   She also has a small round open area on breast with yellow eschar visible not warmness or redness surrounding it. She says her doctors area aware of it.   POSTURE:  Forward head   LYMPHEDEMA ASSESSMENT:    UPPER EXTREMITY AROM/PROM:   A/PROM RIGHT  11/11/2021    Shoulder extension 75  Shoulder flexion 169  Shoulder abduction 168  Shoulder internal rotation 62  Shoulder external rotation 88                          (Blank rows = not tested)   A/PROM LEFT  11/11/2021 L 12/08/21 L 12/15/21  Shoulder extension 58 65   Shoulder flexion 172 125 175  Shoulder abduction 165 99 174 ( cording plg visible and palpabe at axilla   Shoulder internal rotation 70 68   Shoulder external rotation 88 68 90                          (Blank rows = not tested)     CERVICAL AROM: All within normal limits:      Percent limited  Flexion WFL  Extension WFL  Right lateral flexion WFL  Left lateral flexion WFL  Right rotation WFL  Left rotation WFL        UPPER EXTREMITY STRENGTH: 5/5     LYMPHEDEMA ASSESSMENTS:    LANDMARK RIGHT  11/11/2021  10 cm proximal to olecranon process 28.5  Olecranon process 27  10 cm proximal to ulnar styloid process 20.5  Just proximal to ulnar styloid process 15.2  Across hand at thumb web space 19.5  At base of 2nd digit 6.6  (Blank rows = not tested)   LANDMARK LEFT  11/11/2021 L 12/08/21   10 cm proximal to olecranon process 28.5 29 28.5  Olecranon process 26 25.3 26.2  10 cm proximal to ulnar styloid process 21.4 20.4 20.5  Just proximal to ulnar styloid process 15.3 14.8 15.7  Across hand at thumb web space 18 17.5 18  At base of 2nd digit 6.4 6.1 6.3  (Blank rows = not tested)                    Surgery type/Date: 11/12/21- L lumpectomy and SLNB (0/3) Number of lymph nodes removed: 0/3 Current/past treatment (chemo, radiation, hormone therapy): will require radiation Other symptoms:  Heaviness/tightness No Pain No Pitting edema No Infections No Decreased scar mobility No Stemmer sign No     PATIENT EDUCATION:  Education details: added lower trunk rotation and open book stretch with elbow bent.   Showed compression bras and where to get them, try tg soft on elbow  Person educated: Patient Education method: Explanation and Handouts Education comprehension: verbalized understanding     HOME EXERCISE PROGRAM:            Reviewed previously given post op HEP. Added lower trunk rotation and open book with folded arm    TREATMENT: Today's treatment   12/18/21: Myofascial release to cording from axilla to forearm. One release noted near  L axilla. Since pt has been swelling MLD was completed to LUE as follows: in supine: short neck, right axillary nodes, establishment of interaxillary pathway, left inguinal nodes and establishment of axillo inguinal pathway, LUE working proximal to distal then retracing all steps. Circumferential measurements were taken today and were similar to last time   12/15/21:   AROM to neck and shoulders in supine and sidelying.   MLD and manual stretching to cording in left forearm and arm to axilla and lateral shoulder.  Pt is currently undergoing radiations and was given information about getting a light compression or radiation bra from [employer] as options. Also give a piece of tg soft to see if that would help with the fullness at elbow.                 12/08/21: Manual therapy: in supine myofascial release to cording extending from antecubital fossa to axilla with approximately 8 releases noted during session today. Pt felt significantly better with improved ROM by end of session.   ASSESSMENT:   CLINICAL IMPRESSION:  Pt continues to demonstrate improved L shoulder ROM. She still has palpable and visible cording throughout LUE. Continued with myofascial release to cording to decrease tightness especially near axilla where she still has discomfort. Ended session with MLD to LUE. Circumferential measurements were taken today and did not demonstrate a significant increase. Will remeasure them again at next session to assess for any increase in swelling.    Pt  will benefit from skilled therapeutic intervention to improve on the following deficits: Decreased knowledge of precautions, impaired UE functional use, pain, decreased ROM, postural dysfunction.    PT treatment/interventions: ADL/Self care home management, Therapeutic exercises, Patient/Family education, Orthotic/Fit training, Manual lymph drainage, Compression bandaging, Vasopneumatic device, and Manual therapy         GOALS: Goals reviewed with patient? Yes   LONG TERM GOALS:  (STG=LTG)   GOALS Name Target Date   Goal status  1 Pt will demonstrate she has regained full shoulder ROM and function post operatively compared to baselines.  Baseline: 01/05/2022 MET  2 Pt will demonstrate 170 degrees of L shoulder flexion to allow pt to reach overhead.  Baseline: 125 01/05/2022 MET  3 Pt will demonstrate 165 degrees of L shoulder abduction to allow pt to reach out to the side. Baseline: 99 01/05/2022 MET  4 Pt will report she is able to raise arm to end range in all directions without discomfort from cording. 01/05/2022 INITIAL        PLAN: PT FREQUENCY/DURATION: 2x/wk for 4 wks   PLAN FOR NEXT SESSION: continue myofascial release for cording, PROM to L shoulder, MLD ito LUE, Remeasure LUE continue with exercise progression . Did tg soft help at all?      Allyson Sabal [place], [place] 12/18/21 3:05 PM

## 2021-12-19 ENCOUNTER — Other Ambulatory Visit: Payer: Self-pay

## 2021-12-19 ENCOUNTER — Ambulatory Visit
Admission: RE | Admit: 2021-12-19 | Discharge: 2021-12-19 | Disposition: A | Discharge status: Home / Self Care | Payer: 59 | Source: Ambulatory Visit | Attending: Radiation Oncology | Admitting: Radiation Oncology

## 2021-12-19 DIAGNOSIS — Z170 Estrogen receptor positive status [ER+]: Secondary | ICD-10-CM | POA: Diagnosis not present

## 2021-12-19 DIAGNOSIS — D0512 Intraductal carcinoma in situ of left breast: Secondary | ICD-10-CM | POA: Diagnosis not present

## 2021-12-19 LAB — RAD ONC ARIA SESSION SUMMARY
Course Elapsed Days: 9
Plan Fractions Treated to Date: 8
Plan Prescribed Dose Per Fraction: 1.8 Gy
Plan Total Fractions Prescribed: 28
Plan Total Prescribed Dose: 50.4 Gy
Reference Point Dosage Given to Date: 14.4 Gy
Reference Point Session Dosage Given: 1.8 Gy
Session Number: 8

## 2021-12-21 ENCOUNTER — Other Ambulatory Visit: Payer: Self-pay

## 2021-12-22 ENCOUNTER — Other Ambulatory Visit: Payer: Self-pay

## 2021-12-22 ENCOUNTER — Ambulatory Visit: Payer: 59

## 2021-12-22 ENCOUNTER — Ambulatory Visit
Admission: RE | Admit: 2021-12-22 | Discharge: 2021-12-22 | Disposition: A | Discharge status: Home / Self Care | Payer: 59 | Source: Ambulatory Visit | Attending: Radiation Oncology | Admitting: Radiation Oncology

## 2021-12-22 DIAGNOSIS — Z483 Aftercare following surgery for neoplasm: Secondary | ICD-10-CM

## 2021-12-22 DIAGNOSIS — R293 Abnormal posture: Secondary | ICD-10-CM

## 2021-12-22 DIAGNOSIS — C50912 Malignant neoplasm of unspecified site of left female breast: Secondary | ICD-10-CM

## 2021-12-22 DIAGNOSIS — Z170 Estrogen receptor positive status [ER+]: Secondary | ICD-10-CM | POA: Diagnosis not present

## 2021-12-22 DIAGNOSIS — D0512 Intraductal carcinoma in situ of left breast: Secondary | ICD-10-CM | POA: Diagnosis not present

## 2021-12-22 DIAGNOSIS — M25612 Stiffness of left shoulder, not elsewhere classified: Secondary | ICD-10-CM | POA: Diagnosis not present

## 2021-12-22 LAB — RAD ONC ARIA SESSION SUMMARY
Course Elapsed Days: 12
Plan Fractions Treated to Date: 9
Plan Prescribed Dose Per Fraction: 1.8 Gy
Plan Total Fractions Prescribed: 28
Plan Total Prescribed Dose: 50.4 Gy
Reference Point Dosage Given to Date: 16.2 Gy
Reference Point Session Dosage Given: 1.8 Gy
Session Number: 9

## 2021-12-22 NOTE — Therapy (Signed)
OUTPATIENT PHYSICAL THERAPY BREAST CANCER POST OP FOLLOW UP     Patient Name: [name] MRN: [id number] DOB:1970/12/22, 51 y.o., female Today's Date: 12/08/2021    PT End of Session - 12/22/21 1308     Visit Number 5    Number of Visits 10    Date for PT Re-Evaluation 01/05/22    PT Start Time 1259    PT Stop Time 1400    PT Time Calculation (min) 61 min    Activity Tolerance Patient tolerated treatment well    Behavior During Therapy [facility] 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[place])      [facility]---  dx 2015,  takes methotrexate wkly, pt stated she is stable         Past Surgical History:  Procedure Laterality Date   ANTERIOR AND POSTERIOR REPAIR N/A 01/14/2021    Procedure: ANTERIOR (CYSTOCELE) AND POSTERIOR REPAIR (RECTOCELE);  Surgeon: Cheri Fowler, MD;  Location: [facility];  Service: Gynecology;  Laterality: N/A;   AUGMENTATION MAMMAPLASTY Bilateral 2010    saline   BREAST BIOPSY Left 10/23/2021    Stereo Bx, X-clip, path pending   BREAST ENHANCEMENT SURGERY   2009   BREAST LUMPECTOMY WITH RADIOACTIVE SEED AND SENTINEL LYMPH NODE BIOPSY Left 11/12/2021    Procedure: LEFT BREAST LUMPECTOMY WITH RADIOACTIVE SEED AND SENTINEL LYMPH NODE BIOPSY;  Surgeon: Stark Klein, MD;  Location: [place];  Service: General;  Laterality: Left;   CARPAL TUNNEL RELEASE  Right 2011   LAPAROSCOPIC SUPRACERVICAL HYSTERECTOMY   02/21/2010    @ [place] by Dr Willis Modena   NASAL SEPTUM SURGERY   01/2018    insertion button prosthesis for septal perforation   RHINOPLASTY   2005    nasal fracture        Patient Active Problem List    Diagnosis Date Noted   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  09/13/2020   Cervix prolapsed into vagina 09/13/2020   Dysuria 09/13/2020   Genetic testing 09/10/2020   Anxiety 06/18/2020   Mild intermittent asthma without complication 27/09/5007   Bloody diarrhea 06/09/2016   Major depressive disorder, recurrent, severe without psychotic features ([place])     Severe recurrent major depression w/psychotic features, mood-congruent ([place]) 05/04/2014   Major depression 05/04/2014   Positive ANA (antinuclear antibody) 04/10/2014   Granulomatosis with polyangiitis with pulmonary involvement ([place]) 02/07/2014   Generalized anxiety disorder 02/07/2014   Insomnia 02/07/2014   Sensorineural hearing loss of right ear 12/06/2013   Dermatitis 12/05/2013   Nasal septal perforation 12/05/2013   Allergic rhinitis 10/12/2006  Asthma 10/12/2006   GERD 10/12/2006      PCP: [name], MD   REFERRING PROVIDER: Stark Klein, MD    REFERRING DIAG:  [phone] (ICD-10-CM) - Invasive carcinoma of breast ([place])   THERAPY DIAG:  Abnormal posture   Malignant neoplasm of left breast in female, estrogen receptor positive, unspecified site of breast [facility])   Rationale for Evaluation and Treatment Rehabilitation   ONSET DATE: 10/23/21   SUBJECTIVE:                                                                                                                                                                                            SUBJECTIVE STATEMENT: The hole at my lateral breast started gushing fluid yesterday. It's less  today but I still have to keep a bandage on it. I sent a picture to Dr. Chuck Hint office and they said it was normal. I also continued with radiation this morning.  Dr. Baruch Gouty won't sign an order for a compression bra so I'm going to ask Dr. Barry Dienes.  PERTINENT HISTORY:  Patient was diagnosed on 10/23/21 with left DCIS (grade to be determined later). It measures 1.9m and is located in the upper outer quadrant. It is ER/PR +, HER2 -. Underwent a L lumpectomy and SLNB (0/3) on 11/12/21, will require radiation   PATIENT GOALS:  Reassess how my recovery is going related to arm function, pain, and swelling.   PAIN:  Are you having pain? 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[place] pt starts radiation on Wednesday     OBJECTIVE:    PATIENT SURVEYS:  QUICK DASH:   Quick Dash - 12/08/21 0001       Open a tight or new jar Moderate difficulty     Do heavy household chores (wash walls, wash floors) Moderate difficulty     Carry a shopping bag or briefcase Mild difficulty     Wash your back Severe difficulty     Use a knife to cut food Mild difficulty     Recreational activities in which you take some force or impact through your arm, shoulder, or hand (golf, hammering, tennis) Moderate difficulty     During the past week, to what extent has your arm, shoulder or hand problem interfered with your normal social activities with family, friends, neighbors, or groups? Slightly     During the past week, to what extent has your arm, shoulder or hand problem limited your work or other regular daily activities Modererately  Arm, shoulder, or hand pain. Moderate     Tingling (pins and needles) in your arm, shoulder, or hand Severe     Difficulty Sleeping Moderate difficulty     DASH Score 47.73 %                     OBSERVATIONS:          Pt has a small area of swelling just above medial left  elbow with a small firmer area that may be a lymph node?   She also has a small round open area on breast with yellow eschar visible not warmness or redness surrounding it. She says her doctors area aware of it.  POSTURE:  Forward head   LYMPHEDEMA ASSESSMENT:    UPPER EXTREMITY AROM/PROM:   A/PROM RIGHT  11/11/2021    Shoulder extension 75  Shoulder flexion 169  Shoulder abduction 168  Shoulder internal rotation 62  Shoulder external rotation 88                          (Blank rows = not tested)   A/PROM LEFT  11/11/2021 L 12/08/21 L 12/15/21  Shoulder extension 58 65   Shoulder flexion 172 125 175  Shoulder abduction 165 99 174 ( cording plg visible and palpabe at axilla   Shoulder internal rotation 70 68   Shoulder external rotation 88 68 90                          (Blank rows = not tested)     CERVICAL AROM: All within normal limits:      Percent limited  Flexion WFL  Extension WFL  Right lateral flexion WFL  Left lateral flexion WFL  Right rotation WFL  Left rotation WFL        UPPER EXTREMITY STRENGTH: 5/5     LYMPHEDEMA ASSESSMENTS:    LANDMARK RIGHT  11/11/2021  10 cm proximal to olecranon process 28.5  Olecranon process 27  10 cm proximal to ulnar styloid process 20.5  Just proximal to ulnar styloid process 15.2  Across hand at thumb web space 19.5  At base of 2nd digit 6.6  (Blank rows = not tested)   LANDMARK LEFT  11/11/2021 L 12/08/21   10 cm proximal to olecranon process 28.5 29 28.5  Olecranon process 26 25.3 26.2  10 cm proximal to ulnar styloid process 21.4 20.4 20.5  Just proximal to ulnar styloid process 15.3 14.8 15.7  Across hand at thumb web space 18 17.5 18  At base of 2nd digit 6.4 6.1 6.3  (Blank rows = not tested)                    Surgery type/Date: 11/12/21- L lumpectomy and SLNB (0/3) Number of lymph nodes removed: 0/3 Current/past treatment (chemo, radiation, hormone therapy): will require radiation Other symptoms:   Heaviness/tightness No Pain No Pitting edema No Infections No Decreased scar mobility No Stemmer sign No     PATIENT EDUCATION:  Education details: added lower trunk rotation and open book stretch with elbow bent.  Showed compression bras and where to get them, try tg soft on elbow  Person educated: Patient Education method: Explanation and Handouts Education comprehension: verbalized understanding     HOME EXERCISE PROGRAM:            Reviewed previously given post op HEP. Added lower trunk rotation  and open book with folded arm    TREATMENT: Today's treatment  12/22/21: Manual Therapy Myofascial release to cording in Lt axilla. MLD was completed to LUE as follows: in supine: short neck, right axillary nodes, establishment of interaxillary pathway, left inguinal nodes and establishment of axillo inguinal pathway, LUE working proximal to distal then retracing all steps. Was careful to assess opening at lateral breast as this continued to leak clear fluid during session. P/ROM of Lt shoulder into flexion, abduction and D2 to pts tolerance.   12/18/21: Myofascial release to cording from axilla to forearm. One release noted near L axilla. Since pt has been swelling MLD was completed to LUE as follows: in supine: short neck, right axillary nodes, establishment of interaxillary pathway, left inguinal nodes and establishment of axillo inguinal pathway, LUE working proximal to distal then retracing all steps. Circumferential measurements were taken today and were similar to last time   12/15/21:   AROM to neck and shoulders in supine and sidelying.   MLD and manual stretching to cording in left forearm and arm to axilla and lateral shoulder.  Pt is currently undergoing radiations and was given information about getting a light compression or radiation bra from [employer] as options. Also give a piece of tg soft to see if that would help with the fullness at elbow.      ASSESSMENT:    CLINICAL IMPRESSION:   Pt comes in reporting large amount of fluid drained from her breast yesterday from a small opening she has had since surgery that is not at either incision. She reports that she called Dr. Chuck Hint office this morning and they asked her to send a picture and she was told by the nurse not to worry about it. We sent a follow up picture today. Pt also reports she was told she is okay to cont radiation at this time. Continued with manual therapy working to improve her end Lt shoulder P/ROM and MLD to Lt UE. Clear fluid continued to drain from the opening on her breast throughout session so Allevyn bandage was placed over this at end of session. Was careful not to create any pull at area of opening during session.    Pt will benefit from skilled therapeutic intervention to improve on the following deficits: Decreased knowledge of precautions, impaired UE functional use, pain, decreased ROM, postural dysfunction.    PT treatment/interventions: ADL/Self care home management, Therapeutic exercises, Patient/Family education, Orthotic/Fit training, Manual lymph drainage, Compression bandaging, Vasopneumatic device, and Manual therapy         GOALS: Goals reviewed with patient? Yes   LONG TERM GOALS:  (STG=LTG)   GOALS Name Target Date   Goal status  1 Pt will demonstrate she has regained full shoulder ROM and function post operatively compared to baselines.  Baseline: 01/05/2022 MET  2 Pt will demonstrate 170 degrees of L shoulder flexion to allow pt to reach overhead.  Baseline: 125 01/05/2022 MET  3 Pt will demonstrate 165 degrees of L shoulder abduction to allow pt to reach out to the side. Baseline: 99 01/05/2022 MET  4 Pt will report she is able to raise arm to end range in all directions without discomfort from cording. 01/05/2022 INITIAL        PLAN: PT FREQUENCY/DURATION: 2x/wk for 4 wks   PLAN FOR NEXT SESSION: How is opening at breast? continue myofascial release for  cording, PROM to L shoulder, MLD to LUE, Remeasure LUE; continue with exercise progression . Did  tg soft help at all?     Collie Siad, PTA 12/22/21 5:25 PM

## 2021-12-23 ENCOUNTER — Other Ambulatory Visit: Payer: Self-pay

## 2021-12-23 ENCOUNTER — Other Ambulatory Visit: Payer: Self-pay | Admitting: *Deleted

## 2021-12-23 ENCOUNTER — Other Ambulatory Visit: Payer: Self-pay | Admitting: Physician Assistant

## 2021-12-23 ENCOUNTER — Ambulatory Visit
Admission: RE | Admit: 2021-12-23 | Discharge: 2021-12-23 | Disposition: A | Discharge status: Home / Self Care | Payer: 59 | Source: Ambulatory Visit | Attending: Radiation Oncology | Admitting: Radiation Oncology

## 2021-12-23 DIAGNOSIS — D0512 Intraductal carcinoma in situ of left breast: Secondary | ICD-10-CM | POA: Diagnosis not present

## 2021-12-23 DIAGNOSIS — Z170 Estrogen receptor positive status [ER+]: Secondary | ICD-10-CM | POA: Diagnosis not present

## 2021-12-23 LAB — RAD ONC ARIA SESSION SUMMARY
Course Elapsed Days: 13
Plan Fractions Treated to Date: 10
Plan Prescribed Dose Per Fraction: 1.8 Gy
Plan Total Fractions Prescribed: 28
Plan Total Prescribed Dose: 50.4 Gy
Reference Point Dosage Given to Date: 18 Gy
Reference Point Session Dosage Given: 1.8 Gy
Session Number: 10

## 2021-12-24 ENCOUNTER — Other Ambulatory Visit: Payer: Self-pay

## 2021-12-24 ENCOUNTER — Ambulatory Visit
Admission: RE | Admit: 2021-12-24 | Discharge: 2021-12-24 | Disposition: A | Discharge status: Home / Self Care | Payer: 59 | Source: Ambulatory Visit | Attending: Radiation Oncology | Admitting: Radiation Oncology

## 2021-12-24 ENCOUNTER — Other Ambulatory Visit: Payer: Self-pay | Admitting: *Deleted

## 2021-12-24 DIAGNOSIS — D0512 Intraductal carcinoma in situ of left breast: Secondary | ICD-10-CM | POA: Diagnosis not present

## 2021-12-24 DIAGNOSIS — Z170 Estrogen receptor positive status [ER+]: Secondary | ICD-10-CM | POA: Diagnosis not present

## 2021-12-24 LAB — RAD ONC ARIA SESSION SUMMARY
Course Elapsed Days: 14
Plan Fractions Treated to Date: 11
Plan Prescribed Dose Per Fraction: 1.8 Gy
Plan Total Fractions Prescribed: 28
Plan Total Prescribed Dose: 50.4 Gy
Reference Point Dosage Given to Date: 19.8 Gy
Reference Point Session Dosage Given: 1.8 Gy
Session Number: 11

## 2021-12-25 ENCOUNTER — Ambulatory Visit: Payer: 59 | Admitting: Rehabilitation

## 2021-12-25 ENCOUNTER — Ambulatory Visit
Admission: RE | Admit: 2021-12-25 | Discharge: 2021-12-25 | Disposition: A | Discharge status: Home / Self Care | Payer: 59 | Source: Ambulatory Visit | Attending: Radiation Oncology | Admitting: Radiation Oncology

## 2021-12-25 ENCOUNTER — Other Ambulatory Visit: Payer: Self-pay

## 2021-12-25 ENCOUNTER — Encounter: Payer: Self-pay | Admitting: Rehabilitation

## 2021-12-25 DIAGNOSIS — M25612 Stiffness of left shoulder, not elsewhere classified: Secondary | ICD-10-CM | POA: Diagnosis not present

## 2021-12-25 DIAGNOSIS — Z483 Aftercare following surgery for neoplasm: Secondary | ICD-10-CM | POA: Diagnosis not present

## 2021-12-25 DIAGNOSIS — Z170 Estrogen receptor positive status [ER+]: Secondary | ICD-10-CM | POA: Diagnosis not present

## 2021-12-25 DIAGNOSIS — R293 Abnormal posture: Secondary | ICD-10-CM

## 2021-12-25 DIAGNOSIS — D0512 Intraductal carcinoma in situ of left breast: Secondary | ICD-10-CM | POA: Diagnosis not present

## 2021-12-25 DIAGNOSIS — C50912 Malignant neoplasm of unspecified site of left female breast: Secondary | ICD-10-CM | POA: Diagnosis not present

## 2021-12-25 LAB — RAD ONC ARIA SESSION SUMMARY
Course Elapsed Days: 15
Plan Fractions Treated to Date: 12
Plan Prescribed Dose Per Fraction: 1.8 Gy
Plan Total Fractions Prescribed: 28
Plan Total Prescribed Dose: 50.4 Gy
Reference Point Dosage Given to Date: 21.6 Gy
Reference Point Session Dosage Given: 1.8 Gy
Session Number: 12

## 2021-12-25 NOTE — Therapy (Signed)
OUTPATIENT PHYSICAL THERAPY BREAST CANCER POST OP FOLLOW UP     Patient Name: [name] MRN: [id number] DOB:11-24-1970, 51 y.o., female Today's Date: 12/08/2021    PT End of Session - 12/25/21 1329     Visit Number 6    Number of Visits 10    Date for PT Re-Evaluation 01/05/22    PT Start Time 1331    PT Stop Time 1420    PT Time Calculation (min) 49 min    Activity Tolerance Patient tolerated treatment well    Behavior During Therapy [facility] for tasks assessed/performed                      Past Medical History:  Diagnosis Date   Asthma      followed by pcp---  pt stated mild seasonal asthma   Cancer ([place]) 10/2021    left breast DCIS   Female cystocele      symptomatic   GAD (generalized anxiety disorder)     GERD (gastroesophageal reflux disease)     Hiatal hernia     History of 2019 novel coronavirus disease (COVID-19) 07/2020    per pt had mild symptoms that resolved   MDD (major depressive disorder)     Mixed incontinence urge and stress     Wears glasses     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[place])      [facility]---  dx 2015,  takes methotrexate wkly, pt stated she is stable         Past Surgical History:  Procedure Laterality Date   ANTERIOR AND POSTERIOR REPAIR N/A 01/14/2021    Procedure: ANTERIOR (CYSTOCELE) AND POSTERIOR REPAIR (RECTOCELE);  Surgeon: Cheri Fowler, MD;  Location: [facility];  Service: Gynecology;  Laterality: N/A;   AUGMENTATION MAMMAPLASTY Bilateral 2010    saline   BREAST BIOPSY Left 10/23/2021    Stereo Bx, X-clip, path pending   BREAST ENHANCEMENT SURGERY   2009   BREAST LUMPECTOMY WITH RADIOACTIVE SEED AND SENTINEL LYMPH NODE BIOPSY Left 11/12/2021    Procedure: LEFT BREAST LUMPECTOMY WITH RADIOACTIVE SEED AND SENTINEL LYMPH NODE BIOPSY;  Surgeon: Stark Klein, MD;  Location: [place];  Service: General;  Laterality: Left;   CARPAL TUNNEL  RELEASE Right 2011   LAPAROSCOPIC SUPRACERVICAL HYSTERECTOMY   02/21/2010    @ [place] by Dr Willis Modena   NASAL SEPTUM SURGERY   01/2018    insertion button prosthesis for septal perforation   RHINOPLASTY   2005    nasal fracture        Patient Active Problem List    Diagnosis Date Noted   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  09/13/2020   Cervix prolapsed into vagina 09/13/2020   Dysuria 09/13/2020   Genetic testing 09/10/2020   Anxiety 06/18/2020   Mild intermittent asthma without complication 44/07/270   Bloody diarrhea 06/09/2016   Major depressive disorder, recurrent, severe without psychotic features ([place])     Severe recurrent major depression w/psychotic features, mood-congruent ([place]) 05/04/2014   Major depression 05/04/2014   Positive ANA (antinuclear antibody) 04/10/2014   Granulomatosis with polyangiitis with pulmonary involvement ([place]) 02/07/2014   Generalized anxiety disorder 02/07/2014   Insomnia 02/07/2014   Sensorineural hearing loss of right ear 12/06/2013   Dermatitis 12/05/2013   Nasal septal perforation 12/05/2013  Allergic rhinitis 10/12/2006   Asthma 10/12/2006   GERD 10/12/2006      PCP: [name], MD   REFERRING PROVIDER: Stark Klein, MD    REFERRING DIAG:  [phone] (ICD-10-CM) - Invasive carcinoma of breast ([place])   THERAPY DIAG:  Abnormal posture   Malignant neoplasm of left breast in female, estrogen receptor positive, unspecified site of breast [facility])   Rationale for Evaluation and Treatment Rehabilitation   ONSET DATE: 10/23/21   SUBJECTIVE:                                                                                                                                                       SUBJECTIVE STATEMENT: I think the spot is closing up - no fluid has been coming out at all . Skin is getting red.    PERTINENT  HISTORY:  Patient was diagnosed on 10/23/21 with left DCIS (grade to be determined later). It measures 1.37m and is located in the upper outer quadrant. It is ER/PR +, HER2 -. Underwent a L lumpectomy and SLNB (0/3) on 11/12/21, will require radiation   PATIENT GOALS:  Reassess how my recovery is going related to arm function, pain, and swelling.   PAIN:  Are you having pain? 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 pt [place] radiation on Wednesday     OBJECTIVE:    PATIENT SURVEYS:  QUICK DASH:   Quick Dash - 12/08/21 0001       Open a tight or new jar Moderate difficulty     Do heavy household chores (wash walls, wash floors) Moderate difficulty     Carry a shopping bag or briefcase Mild difficulty     Wash your back Severe difficulty     Use a knife to cut food Mild difficulty     Recreational activities in which you take some force or impact through your arm, shoulder, or hand (golf, hammering, tennis) Moderate difficulty     During the past week, to what extent has your arm, shoulder or hand problem interfered with your normal social activities with family, friends, neighbors, or groups? Slightly     During the past week, to what extent has your arm, shoulder or hand problem limited your work or other regular daily activities Modererately     Arm, shoulder, or hand pain. Moderate     Tingling (pins and needles) in your arm, shoulder, or hand Severe     Difficulty Sleeping Moderate difficulty     DASH Score 47.73 %                     OBSERVATIONS:          Pt has  a small area of swelling just above medial left elbow with a small firmer area that may be a lymph node?   She also has a small round open area on breast with yellow eschar visible not warmness or redness surrounding it. She says her doctors area aware of it.  POSTURE:  Forward head   LYMPHEDEMA ASSESSMENT:     UPPER EXTREMITY AROM/PROM:   A/PROM RIGHT  11/11/2021    Shoulder extension 75  Shoulder flexion 169  Shoulder abduction 168  Shoulder internal rotation 62  Shoulder external rotation 88                          (Blank rows = not tested)   A/PROM LEFT  11/11/2021 L 12/08/21 L 12/15/21  Shoulder extension 58 65   Shoulder flexion 172 125 175  Shoulder abduction 165 99 174 ( cording plg visible and palpabe at axilla   Shoulder internal rotation 70 68   Shoulder external rotation 88 68 90                          (Blank rows = not tested)     CERVICAL AROM: All within normal limits:      Percent limited  Flexion WFL  Extension WFL  Right lateral flexion WFL  Left lateral flexion WFL  Right rotation WFL  Left rotation WFL        UPPER EXTREMITY STRENGTH: 5/5     LYMPHEDEMA ASSESSMENTS:    LANDMARK RIGHT  11/11/2021  10 cm proximal to olecranon process 28.5  Olecranon process 27  10 cm proximal to ulnar styloid process 20.5  Just proximal to ulnar styloid process 15.2  Across hand at thumb web space 19.5  At base of 2nd digit 6.6  (Blank rows = not tested)   LANDMARK LEFT  11/11/2021 L 12/08/21   10 cm proximal to olecranon process 28.5 29 28.5  Olecranon process 26 25.3 26.2  10 cm proximal to ulnar styloid process 21.4 20.4 20.5  Just proximal to ulnar styloid process 15.3 14.8 15.7  Across hand at thumb web space 18 17.5 18  At base of 2nd digit 6.4 6.1 6.3  (Blank rows = not tested)                    Surgery type/Date: 11/12/21- L lumpectomy and SLNB (0/3) Number of lymph nodes removed: 0/3 Current/past treatment (chemo, radiation, hormone therapy): will require radiation Other symptoms:  Heaviness/tightness No Pain No Pitting edema No Infections No Decreased scar mobility No Stemmer sign No     PATIENT EDUCATION:  Education details: added lower trunk rotation and open book stretch with elbow bent.  Showed compression bras and where to get them, try tg  soft on elbow  Person educated: Patient Education method: Explanation and Handouts Education comprehension: verbalized understanding     HOME EXERCISE PROGRAM:            Reviewed previously given post op HEP. Added lower trunk rotation and open book with folded arm    TREATMENT: Today's treatment  12/25/21 Manual Therapy Myofascial release to cording in Lt elbow and upper arm. MLD was completed to LUE as follows: in supine: short neck, right axillary nodes, establishment of interaxillary pathway, left inguinal nodes and establishment of axillo inguinal pathway, LUE working proximal to distal then retracing all steps.  P/ROM of Lt shoulder into flexion,  abduction and D2 to pts tolerance.   12/22/21: Manual Therapy Myofascial release to cording in Lt axilla. MLD was completed to LUE as follows: in supine: short neck, right axillary nodes, establishment of interaxillary pathway, left inguinal nodes and establishment of axillo inguinal pathway, LUE working proximal to distal then retracing all steps. Was careful to assess opening at lateral breast as this continued to leak clear fluid during session. P/ROM of Lt shoulder into flexion, abduction and D2 to pts tolerance.   12/18/21: Myofascial release to cording from axilla to forearm. One release noted near L axilla. Since pt has been swelling MLD was completed to LUE as follows: in supine: short neck, right axillary nodes, establishment of interaxillary pathway, left inguinal nodes and establishment of axillo inguinal pathway, LUE working proximal to distal then retracing all steps. Circumferential measurements were taken today and were similar to last time     ASSESSMENT:  CLINICAL IMPRESSION:   Small open spot is not open anymore but has evidence of yellow sloughing tissue here. No more fluid.  Pt has cording mainly now in the elbow but does not feel it much AROM.  Pt is starting to get red in the axilla and breast so axillary work was held.   Discussed how she may be close to DC with return after radiation if needed.  Talked about how pt could get a sleeve at any time online but did not give ordering information.   Pt will benefit from skilled therapeutic intervention to improve on the following deficits: Decreased knowledge of precautions, impaired UE functional use, pain, decreased ROM, postural dysfunction.    PT treatment/interventions: ADL/Self care home management, Therapeutic exercises, Patient/Family education, Orthotic/Fit training, Manual lymph drainage, Compression bandaging, Vasopneumatic device, and Manual therapy         GOALS: Goals reviewed with patient? Yes   LONG TERM GOALS:  (STG=LTG)   GOALS Name Target Date   Goal status  1 Pt will demonstrate she has regained full shoulder ROM and function post operatively compared to baselines.  Baseline: 01/05/2022 MET  2 Pt will demonstrate 170 degrees of L shoulder flexion to allow pt to reach overhead.  Baseline: 125 01/05/2022 MET  3 Pt will demonstrate 165 degrees of L shoulder abduction to allow pt to reach out to the side. Baseline: 99 01/05/2022 MET  4 Pt will report she is able to raise arm to end range in all directions without discomfort from cording. 01/05/2022 INITIAL        PLAN: PT FREQUENCY/DURATION: 2x/wk for 4 wks   PLAN FOR NEXT SESSION: How is opening at breast? continue myofascial release for cording, PROM to L shoulder, MLD to LUE, Remeasure LUE; continue with exercise progression    Shan Levans, PT  12/22/21 5:25 PM

## 2021-12-26 ENCOUNTER — Other Ambulatory Visit: Payer: Self-pay

## 2021-12-26 ENCOUNTER — Ambulatory Visit
Admission: RE | Admit: 2021-12-26 | Discharge: 2021-12-26 | Disposition: A | Discharge status: Home / Self Care | Payer: 59 | Source: Ambulatory Visit | Attending: Radiation Oncology | Admitting: Radiation Oncology

## 2021-12-26 DIAGNOSIS — Z170 Estrogen receptor positive status [ER+]: Secondary | ICD-10-CM | POA: Diagnosis not present

## 2021-12-26 DIAGNOSIS — D0512 Intraductal carcinoma in situ of left breast: Secondary | ICD-10-CM | POA: Diagnosis not present

## 2021-12-26 LAB — RAD ONC ARIA SESSION SUMMARY
Course Elapsed Days: 16
Plan Fractions Treated to Date: 13
Plan Prescribed Dose Per Fraction: 1.8 Gy
Plan Total Fractions Prescribed: 28
Plan Total Prescribed Dose: 50.4 Gy
Reference Point Dosage Given to Date: 23.4 Gy
Reference Point Session Dosage Given: 1.8 Gy
Session Number: 13

## 2021-12-29 ENCOUNTER — Encounter: Payer: Self-pay | Admitting: Physical Therapy

## 2021-12-29 ENCOUNTER — Ambulatory Visit
Admission: RE | Admit: 2021-12-29 | Discharge: 2021-12-29 | Disposition: A | Discharge status: Home / Self Care | Payer: 59 | Source: Ambulatory Visit | Attending: Radiation Oncology | Admitting: Radiation Oncology

## 2021-12-29 ENCOUNTER — Ambulatory Visit: Payer: 59 | Admitting: Physical Therapy

## 2021-12-29 ENCOUNTER — Other Ambulatory Visit: Payer: Self-pay

## 2021-12-29 DIAGNOSIS — M25612 Stiffness of left shoulder, not elsewhere classified: Secondary | ICD-10-CM

## 2021-12-29 DIAGNOSIS — C50912 Malignant neoplasm of unspecified site of left female breast: Secondary | ICD-10-CM | POA: Diagnosis not present

## 2021-12-29 DIAGNOSIS — Z483 Aftercare following surgery for neoplasm: Secondary | ICD-10-CM | POA: Diagnosis not present

## 2021-12-29 DIAGNOSIS — D0512 Intraductal carcinoma in situ of left breast: Secondary | ICD-10-CM | POA: Diagnosis not present

## 2021-12-29 DIAGNOSIS — R293 Abnormal posture: Secondary | ICD-10-CM | POA: Diagnosis not present

## 2021-12-29 DIAGNOSIS — Z170 Estrogen receptor positive status [ER+]: Secondary | ICD-10-CM | POA: Diagnosis not present

## 2021-12-29 LAB — RAD ONC ARIA SESSION SUMMARY
Course Elapsed Days: 19
Plan Fractions Treated to Date: 14
Plan Prescribed Dose Per Fraction: 1.8 Gy
Plan Total Fractions Prescribed: 28
Plan Total Prescribed Dose: 50.4 Gy
Reference Point Dosage Given to Date: 25.2 Gy
Reference Point Session Dosage Given: 1.8 Gy
Session Number: 14

## 2021-12-30 ENCOUNTER — Ambulatory Visit
Admission: RE | Admit: 2021-12-30 | Discharge: 2021-12-30 | Disposition: A | Discharge status: Home / Self Care | Payer: 59 | Source: Ambulatory Visit | Attending: Radiation Oncology | Admitting: Radiation Oncology

## 2021-12-30 ENCOUNTER — Other Ambulatory Visit: Payer: Self-pay

## 2021-12-30 ENCOUNTER — Inpatient Hospital Stay: Payer: 59

## 2021-12-30 DIAGNOSIS — D0512 Intraductal carcinoma in situ of left breast: Secondary | ICD-10-CM

## 2021-12-30 LAB — CBC
HCT: 37.6 % (ref 36.0–46.0)
Hemoglobin: 12.3 g/dL (ref 12.0–15.0)
MCH: 29.1 pg (ref 26.0–34.0)
MCHC: 32.7 g/dL (ref 30.0–36.0)
MCV: 89.1 fL (ref 80.0–100.0)
Platelets: 231 10*3/uL (ref 150–400)
RBC: 4.22 MIL/uL (ref 3.87–5.11)
RDW: 15.4 % (ref 11.5–15.5)
WBC: 4.8 10*3/uL (ref 4.0–10.5)
nRBC: 0 % (ref 0.0–0.2)

## 2021-12-30 LAB — RAD ONC ARIA SESSION SUMMARY
Course Elapsed Days: 20
Plan Fractions Treated to Date: 15
Plan Prescribed Dose Per Fraction: 1.8 Gy
Plan Total Fractions Prescribed: 28
Plan Total Prescribed Dose: 50.4 Gy
Reference Point Dosage Given to Date: 27 Gy
Reference Point Session Dosage Given: 1.8 Gy
Session Number: 15

## 2021-12-31 ENCOUNTER — Ambulatory Visit
Admission: RE | Admit: 2021-12-31 | Discharge: 2021-12-31 | Disposition: A | Discharge status: Home / Self Care | Payer: 59 | Source: Ambulatory Visit | Attending: Radiation Oncology | Admitting: Radiation Oncology

## 2021-12-31 ENCOUNTER — Other Ambulatory Visit: Payer: Self-pay

## 2021-12-31 DIAGNOSIS — D0512 Intraductal carcinoma in situ of left breast: Secondary | ICD-10-CM | POA: Diagnosis not present

## 2021-12-31 LAB — RAD ONC ARIA SESSION SUMMARY
Course Elapsed Days: 21
Plan Fractions Treated to Date: 16
Plan Prescribed Dose Per Fraction: 1.8 Gy
Plan Total Fractions Prescribed: 28
Plan Total Prescribed Dose: 50.4 Gy
Reference Point Dosage Given to Date: 28.8 Gy
Reference Point Session Dosage Given: 1.8 Gy
Session Number: 16

## 2022-01-01 ENCOUNTER — Ambulatory Visit: Payer: 59 | Admitting: Physical Therapy

## 2022-01-01 ENCOUNTER — Encounter: Payer: Self-pay | Admitting: Physical Therapy

## 2022-01-01 ENCOUNTER — Other Ambulatory Visit: Payer: Self-pay

## 2022-01-01 ENCOUNTER — Ambulatory Visit
Admission: RE | Admit: 2022-01-01 | Discharge: 2022-01-01 | Disposition: A | Discharge status: Home / Self Care | Payer: 59 | Source: Ambulatory Visit | Attending: Radiation Oncology | Admitting: Radiation Oncology

## 2022-01-01 DIAGNOSIS — Z170 Estrogen receptor positive status [ER+]: Secondary | ICD-10-CM | POA: Diagnosis not present

## 2022-01-01 DIAGNOSIS — D0512 Intraductal carcinoma in situ of left breast: Secondary | ICD-10-CM | POA: Diagnosis not present

## 2022-01-01 DIAGNOSIS — M25612 Stiffness of left shoulder, not elsewhere classified: Secondary | ICD-10-CM

## 2022-01-01 DIAGNOSIS — C50912 Malignant neoplasm of unspecified site of left female breast: Secondary | ICD-10-CM | POA: Diagnosis not present

## 2022-01-01 DIAGNOSIS — R293 Abnormal posture: Secondary | ICD-10-CM

## 2022-01-01 DIAGNOSIS — Z483 Aftercare following surgery for neoplasm: Secondary | ICD-10-CM

## 2022-01-01 LAB — RAD ONC ARIA SESSION SUMMARY
Course Elapsed Days: 22
Plan Fractions Treated to Date: 17
Plan Prescribed Dose Per Fraction: 1.8 Gy
Plan Total Fractions Prescribed: 28
Plan Total Prescribed Dose: 50.4 Gy
Reference Point Dosage Given to Date: 30.6 Gy
Reference Point Session Dosage Given: 1.8 Gy
Session Number: 17

## 2022-01-01 NOTE — Therapy (Signed)
OUTPATIENT PHYSICAL THERAPY BREAST CANCER POST OP FOLLOW UP     Patient Name: [name] MRN: [id number] DOB:06-22-1971, 51 y.o., female Today's Date: 12/08/2021    PT End of Session - 01/01/22 1303     Visit Number 8    Number of Visits 10    Date for PT Re-Evaluation 01/05/22    PT Start Time 9150    PT Stop Time 5697    PT Time Calculation (min) 56 min    Activity Tolerance Patient tolerated treatment well    Behavior During Therapy [facility] for tasks assessed/performed                      Past Medical History:  Diagnosis Date   Asthma      followed by pcp---  pt stated mild seasonal asthma   Cancer ([place]) 10/2021    left breast DCIS   Female cystocele      symptomatic   GAD (generalized anxiety disorder)     GERD (gastroesophageal reflux disease)     Hiatal hernia     History of 2019 novel coronavirus disease (COVID-19) 07/2020    per pt had mild symptoms that resolved   MDD (major depressive disorder)     Mixed incontinence urge and stress     Wears glasses     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[place])      [facility]---  dx 2015,  takes methotrexate wkly, pt stated she is stable         Past Surgical History:  Procedure Laterality Date   ANTERIOR AND POSTERIOR REPAIR N/A 01/14/2021    Procedure: ANTERIOR (CYSTOCELE) AND POSTERIOR REPAIR (RECTOCELE);  Surgeon: Cheri Fowler, MD;  Location: [facility];  Service: Gynecology;  Laterality: N/A;   AUGMENTATION MAMMAPLASTY Bilateral 2010    saline   BREAST BIOPSY Left 10/23/2021    Stereo Bx, X-clip, path pending   BREAST ENHANCEMENT SURGERY   2009   BREAST LUMPECTOMY WITH RADIOACTIVE SEED AND SENTINEL LYMPH NODE BIOPSY Left 11/12/2021    Procedure: LEFT BREAST LUMPECTOMY WITH RADIOACTIVE SEED AND SENTINEL LYMPH NODE BIOPSY;  Surgeon: Stark Klein, MD;  Location: [place];  Service: General;  Laterality: Left;   CARPAL TUNNEL  RELEASE Right 2011   LAPAROSCOPIC SUPRACERVICAL HYSTERECTOMY   02/21/2010    @ [place] by Dr Willis Modena   NASAL SEPTUM SURGERY   01/2018    insertion button prosthesis for septal perforation   RHINOPLASTY   2005    nasal fracture        Patient Active Problem List    Diagnosis Date Noted   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  09/13/2020   Cervix prolapsed into vagina 09/13/2020   Dysuria 09/13/2020   Genetic testing 09/10/2020   Anxiety 06/18/2020   Mild intermittent asthma without complication 94/80/1655   Bloody diarrhea 06/09/2016   Major depressive disorder, recurrent, severe without psychotic features ([place])     Severe recurrent major depression w/psychotic features, mood-congruent ([place]) 05/04/2014   Major depression 05/04/2014   Positive ANA (antinuclear antibody) 04/10/2014   Granulomatosis with polyangiitis with pulmonary involvement ([place]) 02/07/2014   Generalized anxiety disorder 02/07/2014   Insomnia 02/07/2014   Sensorineural hearing loss of right ear 12/06/2013   Dermatitis 12/05/2013   Nasal septal perforation 12/05/2013  Allergic rhinitis 10/12/2006   Asthma 10/12/2006   GERD 10/12/2006      PCP: [name], MD   REFERRING PROVIDER: Stark Klein, MD    REFERRING DIAG:  [phone] (ICD-10-CM) - Invasive carcinoma of breast ([place])   THERAPY DIAG:  Abnormal posture   Malignant neoplasm of left breast in female, estrogen receptor positive, unspecified site of breast [facility])   Rationale for Evaluation and Treatment Rehabilitation   ONSET DATE: 10/23/21   SUBJECTIVE:                                                                                                                                                       SUBJECTIVE STATEMENT: I feels like the cording now switched to the outside of my forearm.     PERTINENT HISTORY:  Patient was  diagnosed on 10/23/21 with left DCIS (grade to be determined later). It measures 1.34m and is located in the upper outer quadrant. It is ER/PR +, HER2 -. Underwent a L lumpectomy and SLNB (0/3) on 11/12/21, will require radiation   PATIENT GOALS:  Reassess how my recovery is going related to arm function, pain, and swelling.   PAIN:  Are you having pain? 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 Wednesday     OBJECTIVE:    PATIENT SURVEYS:  QUICK DASH:   Quick Dash - 12/08/21 0001       Open a tight or new jar Moderate difficulty     Do heavy household chores (wash walls, wash floors) Moderate difficulty     Carry a shopping bag or briefcase Mild difficulty     Wash your back Severe difficulty     Use a knife to cut food Mild difficulty     Recreational activities in which you take some force or impact through your arm, shoulder, or hand (golf, hammering, tennis) Moderate difficulty     During the past week, to what extent has your arm, shoulder or hand problem interfered with your normal social activities with family, friends, neighbors, or groups? Slightly     During the past week, to what extent has your arm, shoulder or hand problem limited your work or other regular daily activities Modererately     Arm, shoulder, or hand pain. Moderate     Tingling (pins and needles) in your arm, shoulder, or hand Severe     Difficulty Sleeping Moderate difficulty     DASH Score 47.73 %                     OBSERVATIONS:          Pt has a small area of swelling just above  medial left elbow with a small firmer area that may be a lymph node?   She also has a small round open area on breast with yellow eschar visible not warmness or redness surrounding it. She says her doctors area aware of it.  POSTURE:  Forward head   LYMPHEDEMA ASSESSMENT:    UPPER EXTREMITY  AROM/PROM:   A/PROM RIGHT  11/11/2021    Shoulder extension 75  Shoulder flexion 169  Shoulder abduction 168  Shoulder internal rotation 62  Shoulder external rotation 88                          (Blank rows = not tested)   A/PROM LEFT  11/11/2021 L 12/08/21 L 12/15/21  Shoulder extension 58 65   Shoulder flexion 172 125 175  Shoulder abduction 165 99 174 ( cording plg visible and palpabe at axilla   Shoulder internal rotation 70 68   Shoulder external rotation 88 68 90                          (Blank rows = not tested)     CERVICAL AROM: All within normal limits:      Percent limited  Flexion WFL  Extension WFL  Right lateral flexion WFL  Left lateral flexion WFL  Right rotation WFL  Left rotation WFL        UPPER EXTREMITY STRENGTH: 5/5     LYMPHEDEMA ASSESSMENTS:    LANDMARK RIGHT  11/11/2021  10 cm proximal to olecranon process 28.5  Olecranon process 27  10 cm proximal to ulnar styloid process 20.5  Just proximal to ulnar styloid process 15.2  Across hand at thumb web space 19.5  At base of 2nd digit 6.6  (Blank rows = not tested)   LANDMARK LEFT  11/11/2021 L 12/08/21   10 cm proximal to olecranon process 28.5 29 28.5  Olecranon process 26 25.3 26.2  10 cm proximal to ulnar styloid process 21.4 20.4 20.5  Just proximal to ulnar styloid process 15.3 14.8 15.7  Across hand at thumb web space 18 17.5 18  At base of 2nd digit 6.4 6.1 6.3  (Blank rows = not tested)                    Surgery type/Date: 11/12/21- L lumpectomy and SLNB (0/3) Number of lymph nodes removed: 0/3 Current/past treatment (chemo, radiation, hormone therapy): will require radiation Other symptoms:  Heaviness/tightness No Pain No Pitting edema No Infections No Decreased scar mobility No Stemmer sign No     PATIENT EDUCATION:  Education details: added lower trunk rotation and open book stretch with elbow bent.  Showed compression bras and where to get them, try tg soft on elbow  Person  educated: Patient Education method: Explanation and Handouts Education comprehension: verbalized understanding     HOME EXERCISE PROGRAM:            Reviewed previously given post op HEP. Added lower trunk rotation and open book with folded arm    TREATMENT: Today's treatment   12/29/21 Manual Therapy Myofascial release to cording in Lt elbow and upper arm with cording less visible in forearm but still 2 cords visible and palpable in lateral antecubital fossa. Also worked on myofascial release to cording in L axilla with at least 2 cords palpable in this area. P/ROM of Lt shoulder into flexion and abduction  12/29/21 Manual Therapy Myofascial  release to cording in Lt elbow and upper arm with cording less visible in forearm but still 2 cords visible and palpable in lateral antecubital fossa MLD was completed to LUE as follows: in supine: short neck, right axillary nodes, establishment of interaxillary pathway, left inguinal nodes and establishment of axillo inguinal pathway, LUE working proximal to distal then retracing all steps.  P/ROM of Lt shoulder into flexion and abduction  12/25/21 Manual Therapy Myofascial release to cording in Lt elbow and upper arm. MLD was completed to LUE as follows: in supine: short neck, right axillary nodes, establishment of interaxillary pathway, left inguinal nodes and establishment of axillo inguinal pathway, LUE working proximal to distal then retracing all steps.  P/ROM of Lt shoulder into flexion, abduction and D2 to pts tolerance.      ASSESSMENT:  CLINICAL IMPRESSION:   Pt continues to feel tightness from cording extending down to forearm. She feels more cording today on posterior forearm which travels up to medial upper arm and in to her axilla. MFR done to L axilla today where there are at least 2 cords palpable in this area that are thick. No releases noted today but cords were less palpable by end of session.   Pt will benefit from skilled  therapeutic intervention to improve on the following deficits: Decreased knowledge of precautions, impaired UE functional use, pain, decreased ROM, postural dysfunction.    PT treatment/interventions: ADL/Self care home management, Therapeutic exercises, Patient/Family education, Orthotic/Fit training, Manual lymph drainage, Compression bandaging, Vasopneumatic device, and Manual therapy         GOALS: Goals reviewed with patient? Yes   LONG TERM GOALS:  (STG=LTG)   GOALS Name Target Date   Goal status  1 Pt will demonstrate she has regained full shoulder ROM and function post operatively compared to baselines.  Baseline: 01/05/2022 MET  2 Pt will demonstrate 170 degrees of L shoulder flexion to allow pt to reach overhead.  Baseline: 125 01/05/2022 MET  3 Pt will demonstrate 165 degrees of L shoulder abduction to allow pt to reach out to the side. Baseline: 99 01/05/2022 MET  4 Pt will report she is able to raise arm to end range in all directions without discomfort from cording. 01/05/2022 INITIAL        PLAN: PT FREQUENCY/DURATION: 2x/wk for 4 wks   PLAN FOR NEXT SESSION: How is opening at breast? continue myofascial release for cording, PROM to L shoulder, MLD to LUE, Remeasure LUE; continue with exercise progression    Manus Gunning, PT 01/01/22 2:02 PM

## 2022-01-02 ENCOUNTER — Ambulatory Visit
Admission: RE | Admit: 2022-01-02 | Discharge: 2022-01-02 | Disposition: A | Discharge status: Home / Self Care | Payer: 59 | Source: Ambulatory Visit | Attending: Radiation Oncology | Admitting: Radiation Oncology

## 2022-01-02 ENCOUNTER — Other Ambulatory Visit: Payer: Self-pay

## 2022-01-02 DIAGNOSIS — D0512 Intraductal carcinoma in situ of left breast: Secondary | ICD-10-CM | POA: Diagnosis not present

## 2022-01-02 LAB — RAD ONC ARIA SESSION SUMMARY
Course Elapsed Days: 23
Plan Fractions Treated to Date: 18
Plan Prescribed Dose Per Fraction: 1.8 Gy
Plan Total Fractions Prescribed: 28
Plan Total Prescribed Dose: 50.4 Gy
Reference Point Dosage Given to Date: 32.4 Gy
Reference Point Session Dosage Given: 1.8 Gy
Session Number: 18

## 2022-01-05 ENCOUNTER — Ambulatory Visit
Admission: RE | Admit: 2022-01-05 | Discharge: 2022-01-05 | Disposition: A | Discharge status: Home / Self Care | Payer: 59 | Source: Ambulatory Visit | Attending: Radiation Oncology | Admitting: Radiation Oncology

## 2022-01-05 ENCOUNTER — Other Ambulatory Visit: Payer: Self-pay

## 2022-01-05 ENCOUNTER — Ambulatory Visit: Payer: 59 | Attending: General Surgery | Admitting: Physical Therapy

## 2022-01-05 ENCOUNTER — Encounter: Payer: Self-pay | Admitting: Physical Therapy

## 2022-01-05 DIAGNOSIS — D0512 Intraductal carcinoma in situ of left breast: Secondary | ICD-10-CM | POA: Insufficient documentation

## 2022-01-05 DIAGNOSIS — Z170 Estrogen receptor positive status [ER+]: Secondary | ICD-10-CM | POA: Insufficient documentation

## 2022-01-05 DIAGNOSIS — Z79811 Long term (current) use of aromatase inhibitors: Secondary | ICD-10-CM | POA: Insufficient documentation

## 2022-01-05 DIAGNOSIS — Z483 Aftercare following surgery for neoplasm: Secondary | ICD-10-CM | POA: Insufficient documentation

## 2022-01-05 DIAGNOSIS — C50912 Malignant neoplasm of unspecified site of left female breast: Secondary | ICD-10-CM | POA: Diagnosis not present

## 2022-01-05 DIAGNOSIS — R293 Abnormal posture: Secondary | ICD-10-CM | POA: Insufficient documentation

## 2022-01-05 DIAGNOSIS — M25612 Stiffness of left shoulder, not elsewhere classified: Secondary | ICD-10-CM | POA: Diagnosis not present

## 2022-01-05 DIAGNOSIS — Z510 Encounter for antineoplastic radiation therapy: Secondary | ICD-10-CM | POA: Diagnosis not present

## 2022-01-05 LAB — RAD ONC ARIA SESSION SUMMARY
Course Elapsed Days: 26
Plan Fractions Treated to Date: 19
Plan Prescribed Dose Per Fraction: 1.8 Gy
Plan Total Fractions Prescribed: 28
Plan Total Prescribed Dose: 50.4 Gy
Reference Point Dosage Given to Date: 34.2 Gy
Reference Point Session Dosage Given: 1.8 Gy
Session Number: 19

## 2022-01-05 NOTE — Therapy (Signed)
OUTPATIENT PHYSICAL THERAPY ONCOLOGY TREATMENT     Patient Name: [name] MRN: [id number] DOB:1970-12-17, 51 y.o., female Today's Date: 12/08/2021    PT End of Session - 01/05/22 1306     Visit Number 9    Number of Visits 18    Date for PT Re-Evaluation 02/02/22    PT Start Time 1303    PT Stop Time 1324    PT Time Calculation (min) 53 min    Activity Tolerance Patient tolerated treatment well    Behavior During Therapy [facility] for tasks assessed/performed                      Past Medical History:  Diagnosis Date   Asthma      followed by pcp---  pt stated mild seasonal asthma   Cancer ([place]) 10/2021    left breast DCIS   Female cystocele      symptomatic   GAD (generalized anxiety disorder)     GERD (gastroesophageal reflux disease)     Hiatal hernia     History of 2019 novel coronavirus disease (COVID-19) 07/2020    per pt had mild symptoms that resolved   MDD (major depressive disorder)     Mixed incontinence urge and stress     Wears glasses     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[place])      [facility]---  dx 2015,  takes methotrexate wkly, pt stated she is stable         Past Surgical History:  Procedure Laterality Date   ANTERIOR AND POSTERIOR REPAIR N/A 01/14/2021    Procedure: ANTERIOR (CYSTOCELE) AND POSTERIOR REPAIR (RECTOCELE);  Surgeon: Cheri Fowler, MD;  Location: [facility];  Service: Gynecology;  Laterality: N/A;   AUGMENTATION MAMMAPLASTY Bilateral 2010    saline   BREAST BIOPSY Left 10/23/2021    Stereo Bx, X-clip, path pending   BREAST ENHANCEMENT SURGERY   2009   BREAST LUMPECTOMY WITH RADIOACTIVE SEED AND SENTINEL LYMPH NODE BIOPSY Left 11/12/2021    Procedure: LEFT BREAST LUMPECTOMY WITH RADIOACTIVE SEED AND SENTINEL LYMPH NODE BIOPSY;  Surgeon: Stark Klein, MD;  Location: [place];  Service: General;  Laterality: Left;   CARPAL TUNNEL RELEASE Right  2011   LAPAROSCOPIC SUPRACERVICAL HYSTERECTOMY   02/21/2010    @ [place] by Dr Willis Modena   NASAL SEPTUM SURGERY   01/2018    insertion button prosthesis for septal perforation   RHINOPLASTY   2005    nasal fracture        Patient Active Problem List    Diagnosis Date Noted   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  09/13/2020   Cervix prolapsed into vagina 09/13/2020   Dysuria 09/13/2020   Genetic testing 09/10/2020   Anxiety 06/18/2020   Mild intermittent asthma without complication 40/04/2724   Bloody diarrhea 06/09/2016   Major depressive disorder, recurrent, severe without psychotic features ([place])     Severe recurrent major depression w/psychotic features, mood-congruent ([place]) 05/04/2014   Major depression 05/04/2014   Positive ANA (antinuclear antibody) 04/10/2014   Granulomatosis with polyangiitis with pulmonary involvement ([place]) 02/07/2014   Generalized anxiety disorder 02/07/2014   Insomnia 02/07/2014   Sensorineural hearing loss of right ear 12/06/2013   Dermatitis 12/05/2013   Nasal septal perforation 12/05/2013   Allergic rhinitis 10/12/2006  Asthma 10/12/2006   GERD 10/12/2006      PCP: [name], MD   REFERRING PROVIDER: Stark Klein, MD    REFERRING DIAG:  [phone] (ICD-10-CM) - Invasive carcinoma of breast ([place])   THERAPY DIAG:  Abnormal posture   Malignant neoplasm of left breast in female, estrogen receptor positive, unspecified site of breast [facility])   Rationale for Evaluation and Treatment Rehabilitation   ONSET DATE: 10/23/21   SUBJECTIVE:                                                                                                                                                       SUBJECTIVE STATEMENT: I feels like the cording now switched to the outside of my forearm.     PERTINENT HISTORY:  Patient was diagnosed on  10/23/21 with left DCIS (grade to be determined later). It measures 1.62m and is located in the upper outer quadrant. It is ER/PR +, HER2 -. Underwent a L lumpectomy and SLNB (0/3) on 11/12/21, will require radiation   PATIENT GOALS:  Reassess how my recovery is going related to arm function, pain, and swelling.   PAIN:  Are you having pain? 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     OBJECTIVE:    PATIENT SURVEYS:  QUICK DASH:   Quick Dash - 12/08/21 0001       Open a tight or new jar Moderate difficulty     Do heavy household chores (wash walls, wash floors) Moderate difficulty     Carry a shopping bag or briefcase Mild difficulty     Wash your back Severe difficulty     Use a knife to cut food Mild difficulty     Recreational activities in which you take some force or impact through your arm, shoulder, or hand (golf, hammering, tennis) Moderate difficulty     During the past week, to what extent has your arm, shoulder or hand problem interfered with your normal social activities with family, friends, neighbors, or groups? Slightly     During the past week, to what extent has your arm, shoulder or hand problem limited your work or other regular daily activities Modererately     Arm, shoulder, or hand pain. Moderate     Tingling (pins and needles) in your arm, shoulder, or hand Severe     Difficulty Sleeping Moderate difficulty     DASH Score 47.73 %                     OBSERVATIONS:          Pt has a small area of swelling just above medial left elbow with a  small firmer area that may be a lymph node?   She also has a small round open area on breast with yellow eschar visible not warmness or redness surrounding it. She says her doctors area aware of it.  POSTURE:  Forward head   LYMPHEDEMA ASSESSMENT:    UPPER EXTREMITY AROM/PROM:    A/PROM RIGHT  11/11/2021    Shoulder extension 75  Shoulder flexion 169  Shoulder abduction 168  Shoulder internal rotation 62  Shoulder external rotation 88                          (Blank rows = not tested)   A/PROM LEFT  11/11/2021 L 12/08/21 L 12/15/21  Shoulder extension 58 65   Shoulder flexion 172 125 175  Shoulder abduction 165 99 174 ( cording plg visible and palpabe at axilla   Shoulder internal rotation 70 68   Shoulder external rotation 88 68 90                          (Blank rows = not tested)     CERVICAL AROM: All within normal limits:      Percent limited  Flexion WFL  Extension WFL  Right lateral flexion WFL  Left lateral flexion WFL  Right rotation WFL  Left rotation WFL        UPPER EXTREMITY STRENGTH: 5/5     LYMPHEDEMA ASSESSMENTS:    LANDMARK RIGHT  11/11/2021  10 cm proximal to olecranon process 28.5  Olecranon process 27  10 cm proximal to ulnar styloid process 20.5  Just proximal to ulnar styloid process 15.2  Across hand at thumb web space 19.5  At base of 2nd digit 6.6  (Blank rows = not tested)   LANDMARK LEFT  11/11/2021 L 12/08/21  LEFT 01/05/22  10 cm proximal to olecranon process 28.5 29 28.5 28.5  Olecranon process 26 25.3 26._0 cm proximal to ulnar styloid process 21.4 20.4 20.5 21.5  Just proximal to ulnar styloid process 15.3 14.8 15.7 16.1  Across hand at thumb web space 18 17.5 18 17.5  At base of 2nd digit 6.4 6.1 6.3 6  (Blank rows = not tested)                    Surgery type/Date: 11/12/21- L lumpectomy and SLNB (0/3) Number of lymph nodes removed: 0/3 Current/past treatment (chemo, radiation, hormone therapy): will require radiation Other symptoms:  Heaviness/tightness No Pain No Pitting edema No Infections No Decreased scar mobility No Stemmer sign No     PATIENT EDUCATION:  Education details: added lower trunk rotation and open book stretch with elbow bent.  Showed compression bras and where to get them, try  tg soft on elbow  Person educated: Patient Education method: Explanation and Handouts Education comprehension: verbalized understanding     HOME EXERCISE PROGRAM:            Reviewed previously given post op HEP. Added lower trunk rotation and open book with folded arm    TREATMENT: Today's treatment   01/05/22 Manual Therapy Myofascial release to cording in Lt elbow and upper arm with cording less visible in forearm but still 2 cords visible and palpable in lateral antecubital fossa. Also worked on myofascial release to cording in L axilla with at least 2 cords palpable in this area. P/ROM of Lt shoulder into flexion and abduction  Measured  pt for a compression sleeve and glove. Was able to give pt a donated sleeve since it was her size and pt is going to order the compression glove. She fits in to a size 3 [employer] 20-52mHg.   01/01/22 Manual Therapy Myofascial release to cording in Lt elbow and upper arm with cording less visible in forearm but still 2 cords visible and palpable in lateral antecubital fossa. Also worked on myofascial release to cording in L axilla with at least 2 cords palpable in this area. P/ROM of Lt shoulder into flexion and abduction  12/29/21 Manual Therapy Myofascial release to cording in Lt elbow and upper arm with cording less visible in forearm but still 2 cords visible and palpable in lateral antecubital fossa MLD was completed to LUE as follows: in supine: short neck, right axillary nodes, establishment of interaxillary pathway, left inguinal nodes and establishment of axillo inguinal pathway, LUE working proximal to distal then retracing all steps.  P/ROM of Lt shoulder into flexion and abduction  12/25/21 Manual Therapy Myofascial release to cording in Lt elbow and upper arm. MLD was completed to LUE as follows: in supine: short neck, right axillary nodes, establishment of interaxillary pathway, left inguinal nodes and establishment of axillo  inguinal pathway, LUE working proximal to distal then retracing all steps.  P/ROM of Lt shoulder into flexion, abduction and D2 to pts tolerance.      ASSESSMENT:  CLINICAL IMPRESSION:   Pt's tightness and discomfort in her forearm has worsened since last visit. She has numerous cords that are visible and palpable and there is visible fullness in her wrist. Remeasured circumferences and her finger and hand had decreased but her wrist swelling has increased. She has numbness from her hand to elbow at times. She may be having carpal tunnel symptoms that are worsened from her swelling. Measured pt for a compression sleeve and glove and issued her info on obtaining. Educated her to wear it during day time hours. Continued with myofascial release today to cording. Pt would benefit from continued skilled PT services to continue to decrease cording throughout LUE to allow improved comfort.   Pt will benefit from skilled therapeutic intervention to improve on the following deficits: Decreased knowledge of precautions, impaired UE functional use, pain, decreased ROM, postural dysfunction.    PT treatment/interventions: ADL/Self care home management, Therapeutic exercises, Patient/Family education, Orthotic/Fit training, Manual lymph drainage, Compression bandaging, Vasopneumatic device, and Manual therapy         GOALS: Goals reviewed with patient? Yes   LONG TERM GOALS:  (STG=LTG)   GOALS Name Target Date   Goal status  1 Pt will demonstrate she has regained full shoulder ROM and function post operatively compared to baselines.  Baseline: 01/05/2022 MET  2 Pt will demonstrate 170 degrees of L shoulder flexion to allow pt to reach overhead.  Baseline: 125 01/05/2022 MET  3 Pt will demonstrate 165 degrees of L shoulder abduction to allow pt to reach out to the side. Baseline: 99 01/05/2022 MET  4 Pt will report she is able to raise arm to end range in all directions without discomfort from cording. 01/05/22  ONGOING- pt still having discomfort at forearm due to cording with numerous cords visible and palpable        PLAN: PT FREQUENCY/DURATION: 2x/wk for 4 wks   PLAN FOR NEXT SESSION: How is opening at breast? continue myofascial release for cording, PROM to L shoulder, MLD to LUE, Remeasure LUE; continue with exercise progression  Allyson Sabal [place], [place] 01/05/22 1:59 PM

## 2022-01-07 ENCOUNTER — Other Ambulatory Visit: Payer: Self-pay

## 2022-01-07 ENCOUNTER — Ambulatory Visit
Admission: RE | Admit: 2022-01-07 | Discharge: 2022-01-07 | Disposition: A | Discharge status: Home / Self Care | Payer: 59 | Source: Ambulatory Visit | Attending: Radiation Oncology | Admitting: Radiation Oncology

## 2022-01-07 DIAGNOSIS — Z79811 Long term (current) use of aromatase inhibitors: Secondary | ICD-10-CM | POA: Diagnosis not present

## 2022-01-07 DIAGNOSIS — Z510 Encounter for antineoplastic radiation therapy: Secondary | ICD-10-CM | POA: Diagnosis not present

## 2022-01-07 DIAGNOSIS — Z170 Estrogen receptor positive status [ER+]: Secondary | ICD-10-CM | POA: Diagnosis not present

## 2022-01-07 DIAGNOSIS — D0512 Intraductal carcinoma in situ of left breast: Secondary | ICD-10-CM | POA: Diagnosis not present

## 2022-01-07 LAB — RAD ONC ARIA SESSION SUMMARY
Course Elapsed Days: 28
Plan Fractions Treated to Date: 20
Plan Prescribed Dose Per Fraction: 1.8 Gy
Plan Total Fractions Prescribed: 28
Plan Total Prescribed Dose: 50.4 Gy
Reference Point Dosage Given to Date: 36 Gy
Reference Point Session Dosage Given: 1.8 Gy
Session Number: 20

## 2022-01-08 ENCOUNTER — Other Ambulatory Visit: Payer: Self-pay

## 2022-01-08 ENCOUNTER — Encounter: Payer: Self-pay | Admitting: Physical Therapy

## 2022-01-08 ENCOUNTER — Ambulatory Visit
Admission: RE | Admit: 2022-01-08 | Discharge: 2022-01-08 | Disposition: A | Discharge status: Home / Self Care | Payer: 59 | Source: Ambulatory Visit | Attending: Radiation Oncology | Admitting: Radiation Oncology

## 2022-01-08 ENCOUNTER — Ambulatory Visit: Payer: 59

## 2022-01-08 ENCOUNTER — Other Ambulatory Visit: Payer: Self-pay | Admitting: *Deleted

## 2022-01-08 DIAGNOSIS — Z170 Estrogen receptor positive status [ER+]: Secondary | ICD-10-CM | POA: Diagnosis not present

## 2022-01-08 DIAGNOSIS — Z510 Encounter for antineoplastic radiation therapy: Secondary | ICD-10-CM | POA: Diagnosis not present

## 2022-01-08 DIAGNOSIS — D0512 Intraductal carcinoma in situ of left breast: Secondary | ICD-10-CM | POA: Diagnosis not present

## 2022-01-08 DIAGNOSIS — Z79811 Long term (current) use of aromatase inhibitors: Secondary | ICD-10-CM | POA: Diagnosis not present

## 2022-01-08 LAB — RAD ONC ARIA SESSION SUMMARY
Course Elapsed Days: 29
Plan Fractions Treated to Date: 21
Plan Prescribed Dose Per Fraction: 1.8 Gy
Plan Total Fractions Prescribed: 28
Plan Total Prescribed Dose: 50.4 Gy
Reference Point Dosage Given to Date: 37.8 Gy
Reference Point Session Dosage Given: 1.8 Gy
Session Number: 21

## 2022-01-09 ENCOUNTER — Other Ambulatory Visit: Payer: Self-pay

## 2022-01-09 ENCOUNTER — Ambulatory Visit
Admission: RE | Admit: 2022-01-09 | Discharge: 2022-01-09 | Disposition: A | Discharge status: Home / Self Care | Payer: 59 | Source: Ambulatory Visit | Attending: Radiation Oncology | Admitting: Radiation Oncology

## 2022-01-09 DIAGNOSIS — Z170 Estrogen receptor positive status [ER+]: Secondary | ICD-10-CM | POA: Diagnosis not present

## 2022-01-09 DIAGNOSIS — Z79811 Long term (current) use of aromatase inhibitors: Secondary | ICD-10-CM | POA: Diagnosis not present

## 2022-01-09 DIAGNOSIS — Z510 Encounter for antineoplastic radiation therapy: Secondary | ICD-10-CM | POA: Diagnosis not present

## 2022-01-09 DIAGNOSIS — D0512 Intraductal carcinoma in situ of left breast: Secondary | ICD-10-CM | POA: Diagnosis not present

## 2022-01-09 LAB — RAD ONC ARIA SESSION SUMMARY
Course Elapsed Days: 30
Plan Fractions Treated to Date: 22
Plan Prescribed Dose Per Fraction: 1.8 Gy
Plan Total Fractions Prescribed: 28
Plan Total Prescribed Dose: 50.4 Gy
Reference Point Dosage Given to Date: 39.6 Gy
Reference Point Session Dosage Given: 1.8 Gy
Session Number: 22

## 2022-01-12 ENCOUNTER — Ambulatory Visit
Admission: RE | Admit: 2022-01-12 | Discharge: 2022-01-12 | Disposition: A | Discharge status: Home / Self Care | Payer: 59 | Source: Ambulatory Visit | Attending: Radiation Oncology | Admitting: Radiation Oncology

## 2022-01-12 ENCOUNTER — Other Ambulatory Visit: Payer: Self-pay

## 2022-01-12 DIAGNOSIS — Z79811 Long term (current) use of aromatase inhibitors: Secondary | ICD-10-CM | POA: Diagnosis not present

## 2022-01-12 DIAGNOSIS — D0512 Intraductal carcinoma in situ of left breast: Secondary | ICD-10-CM | POA: Diagnosis not present

## 2022-01-12 DIAGNOSIS — Z510 Encounter for antineoplastic radiation therapy: Secondary | ICD-10-CM | POA: Diagnosis not present

## 2022-01-12 DIAGNOSIS — Z170 Estrogen receptor positive status [ER+]: Secondary | ICD-10-CM | POA: Diagnosis not present

## 2022-01-12 LAB — RAD ONC ARIA SESSION SUMMARY
Course Elapsed Days: 33
Plan Fractions Treated to Date: 23
Plan Prescribed Dose Per Fraction: 1.8 Gy
Plan Total Fractions Prescribed: 28
Plan Total Prescribed Dose: 50.4 Gy
Reference Point Dosage Given to Date: 41.4 Gy
Reference Point Session Dosage Given: 1.8 Gy
Session Number: 23

## 2022-01-13 ENCOUNTER — Other Ambulatory Visit: Payer: Self-pay

## 2022-01-13 ENCOUNTER — Inpatient Hospital Stay: Payer: 59 | Attending: Oncology

## 2022-01-13 ENCOUNTER — Other Ambulatory Visit: Payer: 59

## 2022-01-13 ENCOUNTER — Ambulatory Visit
Admission: RE | Admit: 2022-01-13 | Discharge: 2022-01-13 | Disposition: A | Discharge status: Home / Self Care | Payer: 59 | Source: Ambulatory Visit | Attending: Radiation Oncology | Admitting: Radiation Oncology

## 2022-01-13 DIAGNOSIS — Z510 Encounter for antineoplastic radiation therapy: Secondary | ICD-10-CM | POA: Diagnosis not present

## 2022-01-13 DIAGNOSIS — Z170 Estrogen receptor positive status [ER+]: Secondary | ICD-10-CM | POA: Diagnosis not present

## 2022-01-13 DIAGNOSIS — D0512 Intraductal carcinoma in situ of left breast: Secondary | ICD-10-CM

## 2022-01-13 DIAGNOSIS — Z79811 Long term (current) use of aromatase inhibitors: Secondary | ICD-10-CM | POA: Diagnosis not present

## 2022-01-13 LAB — RAD ONC ARIA SESSION SUMMARY
Course Elapsed Days: 34
Plan Fractions Treated to Date: 24
Plan Prescribed Dose Per Fraction: 1.8 Gy
Plan Total Fractions Prescribed: 28
Plan Total Prescribed Dose: 50.4 Gy
Reference Point Dosage Given to Date: 43.2 Gy
Reference Point Session Dosage Given: 1.8 Gy
Session Number: 24

## 2022-01-13 LAB — CBC
HCT: 36.3 % (ref 36.0–46.0)
Hemoglobin: 11.7 g/dL — ABNORMAL LOW (ref 12.0–15.0)
MCH: 28.8 pg (ref 26.0–34.0)
MCHC: 32.2 g/dL (ref 30.0–36.0)
MCV: 89.4 fL (ref 80.0–100.0)
Platelets: 220 10*3/uL (ref 150–400)
RBC: 4.06 MIL/uL (ref 3.87–5.11)
RDW: 15.3 % (ref 11.5–15.5)
WBC: 5.4 10*3/uL (ref 4.0–10.5)
nRBC: 0 % (ref 0.0–0.2)

## 2022-01-14 ENCOUNTER — Ambulatory Visit: Payer: 59 | Admitting: Rehabilitation

## 2022-01-14 ENCOUNTER — Ambulatory Visit
Admission: RE | Admit: 2022-01-14 | Discharge: 2022-01-14 | Disposition: A | Discharge status: Home / Self Care | Payer: 59 | Source: Ambulatory Visit | Attending: Radiation Oncology | Admitting: Radiation Oncology

## 2022-01-14 ENCOUNTER — Encounter: Payer: Self-pay | Admitting: Rehabilitation

## 2022-01-14 ENCOUNTER — Other Ambulatory Visit: Payer: Self-pay

## 2022-01-14 DIAGNOSIS — Z170 Estrogen receptor positive status [ER+]: Secondary | ICD-10-CM | POA: Diagnosis not present

## 2022-01-14 DIAGNOSIS — Z483 Aftercare following surgery for neoplasm: Secondary | ICD-10-CM | POA: Diagnosis not present

## 2022-01-14 DIAGNOSIS — R293 Abnormal posture: Secondary | ICD-10-CM

## 2022-01-14 DIAGNOSIS — C50912 Malignant neoplasm of unspecified site of left female breast: Secondary | ICD-10-CM

## 2022-01-14 DIAGNOSIS — M25612 Stiffness of left shoulder, not elsewhere classified: Secondary | ICD-10-CM | POA: Diagnosis not present

## 2022-01-14 DIAGNOSIS — Z79811 Long term (current) use of aromatase inhibitors: Secondary | ICD-10-CM | POA: Diagnosis not present

## 2022-01-14 DIAGNOSIS — Z510 Encounter for antineoplastic radiation therapy: Secondary | ICD-10-CM | POA: Diagnosis not present

## 2022-01-14 DIAGNOSIS — D0512 Intraductal carcinoma in situ of left breast: Secondary | ICD-10-CM | POA: Diagnosis not present

## 2022-01-14 LAB — RAD ONC ARIA SESSION SUMMARY
Course Elapsed Days: 35
Plan Fractions Treated to Date: 25
Plan Prescribed Dose Per Fraction: 1.8 Gy
Plan Total Fractions Prescribed: 28
Plan Total Prescribed Dose: 50.4 Gy
Reference Point Dosage Given to Date: 45 Gy
Reference Point Session Dosage Given: 1.8 Gy
Session Number: 25

## 2022-01-14 NOTE — Therapy (Signed)
OUTPATIENT PHYSICAL THERAPY ONCOLOGY TREATMENT     Patient Name: CYNIAH GOSSARD MRN: [id number] DOB:Apr 02, 1971, 51 y.o., female Today's Date: 12/08/2021    PT End of Session - 01/14/22 1604     Visit Number 10    Number of Visits 18    Date for PT Re-Evaluation 02/02/22    PT Start Time 1605    PT Stop Time 1245    PT Time Calculation (min) 48 min    Activity Tolerance Patient tolerated treatment well    Behavior During Therapy [facility] for tasks assessed/performed                      Past Medical History:  Diagnosis Date   Asthma      followed by pcp---  pt stated mild seasonal asthma   Cancer ([place]) 10/2021    left breast DCIS   Female cystocele      symptomatic   GAD (generalized anxiety disorder)     GERD (gastroesophageal reflux disease)     Hiatal hernia     History of 2019 novel coronavirus disease (COVID-19) 07/2020    per pt had mild symptoms that resolved   MDD (major depressive disorder)     Mixed incontinence urge and stress     Wears glasses     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[place])      [facility]---  dx 2015,  takes methotrexate wkly, pt stated she is stable         Past Surgical History:  Procedure Laterality Date   ANTERIOR AND POSTERIOR REPAIR N/A 01/14/2021    Procedure: ANTERIOR (CYSTOCELE) AND POSTERIOR REPAIR (RECTOCELE);  Surgeon: Cheri Fowler, MD;  Location: [facility];  Service: Gynecology;  Laterality: N/A;   AUGMENTATION MAMMAPLASTY Bilateral 2010    saline   BREAST BIOPSY Left 10/23/2021    Stereo Bx, X-clip, path pending   BREAST ENHANCEMENT SURGERY   2009   BREAST LUMPECTOMY WITH RADIOACTIVE SEED AND SENTINEL LYMPH NODE BIOPSY Left 11/12/2021    Procedure: LEFT BREAST LUMPECTOMY WITH RADIOACTIVE SEED AND SENTINEL LYMPH NODE BIOPSY;  Surgeon: Stark Klein, MD;  Location: [place];  Service: General;  Laterality: Left;   CARPAL TUNNEL RELEASE Right  2011   LAPAROSCOPIC SUPRACERVICAL HYSTERECTOMY   02/21/2010    @ [place] by Dr Willis Modena   NASAL SEPTUM SURGERY   01/2018    insertion button prosthesis for septal perforation   RHINOPLASTY   2005    nasal fracture        Patient Active Problem List    Diagnosis Date Noted   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  09/13/2020   Cervix prolapsed into vagina 09/13/2020   Dysuria 09/13/2020   Genetic testing 09/10/2020   Anxiety 06/18/2020   Mild intermittent asthma without complication 80/99/8338   Bloody diarrhea 06/09/2016   Major depressive disorder, recurrent, severe without psychotic features ([place])     Severe recurrent major depression w/psychotic features, mood-congruent ([place]) 05/04/2014   Major depression 05/04/2014   Positive ANA (antinuclear antibody) 04/10/2014   Granulomatosis with polyangiitis with pulmonary involvement ([place]) 02/07/2014   Generalized anxiety disorder 02/07/2014   Insomnia 02/07/2014   Sensorineural hearing loss of right ear 12/06/2013   Dermatitis 12/05/2013   Nasal septal perforation 12/05/2013   Allergic rhinitis 10/12/2006  Asthma 10/12/2006   GERD 10/12/2006      PCP: [name], MD   REFERRING PROVIDER: Stark Klein, MD    REFERRING DIAG:  [phone] (ICD-10-CM) - Invasive carcinoma of breast ([place])   THERAPY DIAG:  Abnormal posture   Malignant neoplasm of left breast in female, estrogen receptor positive, unspecified site of breast [facility])   Rationale for Evaluation and Treatment Rehabilitation   ONSET DATE: 10/23/21   SUBJECTIVE:                                                                                                                                                       SUBJECTIVE STATEMENT:  I ordered the hand piece.  I am still having drainage to the spot on the left breast. My skin is raw.  I have 10 more  treatments.     PERTINENT HISTORY:  Patient was diagnosed on 10/23/21 with left DCIS (grade to be determined later). It measures 1.55m and is located in the upper outer quadrant. It is ER/PR +, HER2 -. Underwent a L lumpectomy and SLNB (0/3) on 11/12/21, will require radiation   PATIENT GOALS:  Reassess how my recovery is going related to arm function, pain, and swelling.   PAIN:  Are you having pain? 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 pt [place] radiation on Wednesday     OBJECTIVE  LYMPHEDEMA ASSESSMENT:    UPPER EXTREMITY AROM/PROM:   A/PROM RIGHT  11/11/2021    Shoulder extension 75  Shoulder flexion 169  Shoulder abduction 168  Shoulder internal rotation 62  Shoulder external rotation 88                          (Blank rows = not tested)   A/PROM LEFT  11/11/2021 L 12/08/21 L 12/15/21  Shoulder extension 58 65   Shoulder flexion 172 125 175  Shoulder abduction 165 99 174 ( cording plg visible and palpabe at axilla   Shoulder internal rotation 70 68   Shoulder external rotation 88 68 90                          (Blank rows = not tested)    LYMPHEDEMA ASSESSMENTS:    LANDMARK RIGHT  11/11/2021  10 cm proximal to olecranon process 28.5  Olecranon process 27  10 cm proximal to ulnar styloid process 20.5  Just proximal to ulnar styloid process 15.2  Across hand at thumb web space 19.5  At base of 2nd digit 6.6  (Blank rows = not tested)   LANDMARK LEFT  11/11/2021 L 12/08/21  LEFT 01/05/22  10 cm proximal to  olecranon process 28.5 29 28.5 28.5  Olecranon process 26 25.3 26.'2 26  10 ' cm proximal to ulnar styloid process 21.4 20.4 20.5 21.5  Just proximal to ulnar styloid process 15.3 14.8 15.7 16.1  Across hand at thumb web space 18 17.5 18 17.5  At base of 2nd digit 6.4 6.1 6.3 6  (Blank rows = not tested)      PATIENT EDUCATION:  Education details: added lower trunk  rotation and open book stretch with elbow bent.  Showed compression bras and where to get them, try tg soft on elbow  Person educated: Patient Education method: Explanation and Handouts Education comprehension: verbalized understanding     HOME EXERCISE PROGRAM:            Reviewed previously given post op HEP. Added lower trunk rotation and open book with folded arm    TREATMENT: Today's treatment  01/14/22 Pt is starting to have red fragile skin in the axilla and breast so MFR was held in the axilla but was continued in the upper arm towards the lateral antecubital fossa.  P/ROM of Lt shoulder into flexion and abduction but only briefly due to redness MLD to the Lt UE avoiding axilla and anterior chest: In supine: Short neck, 5 diaphragmatic breaths, R axillary nodes and establishment of interaxillary pathway, L inguinal nodes and establishment of axilloinguinal pathway, then L UE working proximal to distal, moving fluid from upper inner arm outwards, and doing both sides of forearm moving fluid towards pathways spending extra time in any areas of fibrosis then retracing all steps     01/05/22 Manual Therapy Myofascial release to cording in Lt elbow and upper arm with cording less visible in forearm but still 2 cords visible and palpable in lateral antecubital fossa. Also worked on myofascial release to cording in L axilla with at least 2 cords palpable in this area. P/ROM of Lt shoulder into flexion and abduction  Measured pt for a compression sleeve and glove. Was able to give pt a donated sleeve since it was her size and pt is going to order the compression glove. She fits in to a size 3 Medi Harmony 20-17mHg.   01/01/22 Manual Therapy Myofascial release to cording in Lt elbow and upper arm with cording less visible in forearm but still 2 cords visible and palpable in lateral antecubital fossa. Also worked on myofascial release to cording in L axilla with at least 2 cords palpable in  this area. P/ROM of Lt shoulder into flexion and abduction  12/29/21 Manual Therapy Myofascial release to cording in Lt elbow and upper arm with cording less visible in forearm but still 2 cords visible and palpable in lateral antecubital fossa MLD was completed to LUE as follows: in supine: short neck, right axillary nodes, establishment of interaxillary pathway, left inguinal nodes and establishment of axillo inguinal pathway, LUE working proximal to distal then retracing all steps.  P/ROM of Lt shoulder into flexion and abduction  12/25/21 Manual Therapy Myofascial release to cording in Lt elbow and upper arm. MLD was completed to LUE as follows: in supine: short neck, right axillary nodes, establishment of interaxillary pathway, left inguinal nodes and establishment of axillo inguinal pathway, LUE working proximal to distal then retracing all steps.  P/ROM of Lt shoulder into flexion, abduction and D2 to pts tolerance.      ASSESSMENT:  CLINICAL IMPRESSION:   Pt is starting to have redness in the axilla almost to the blistering point as redness and discomfort  in the breast so treatment was modified today and will most likely need to be held after making sure that her new garment is working well on the next visit.   Pt will benefit from skilled therapeutic intervention to improve on the following deficits: Decreased knowledge of precautions, impaired UE functional use, pain, decreased ROM, postural dysfunction.    PT treatment/interventions: ADL/Self care home management, Therapeutic exercises, Patient/Family education, Orthotic/Fit training, Manual lymph drainage, Compression bandaging, Vasopneumatic device, and Manual therapy         GOALS: Goals reviewed with patient? Yes   LONG TERM GOALS:  (STG=LTG)   GOALS Name Target Date   Goal status  1 Pt will demonstrate she has regained full shoulder ROM and function post operatively compared to baselines.  Baseline: 01/05/2022 MET  2  Pt will demonstrate 170 degrees of L shoulder flexion to allow pt to reach overhead.  Baseline: 125 01/05/2022 MET  3 Pt will demonstrate 165 degrees of L shoulder abduction to allow pt to reach out to the side. Baseline: 99 01/05/2022 MET  4 Pt will report she is able to raise arm to end range in all directions without discomfort from cording. 01/05/22 ONGOING- pt still having discomfort at forearm due to cording with numerous cords visible and palpable        PLAN: PT FREQUENCY/DURATION: 2x/wk for 4 wks   PLAN FOR NEXT SESSION: get new garment? Check fit, may need to hold due to radiation, continue myofascial release for cording, PROM to L shoulder, MLD to LUE,    Shan Levans, PT  01/14/22 10:41 PM

## 2022-01-15 ENCOUNTER — Ambulatory Visit
Admission: RE | Admit: 2022-01-15 | Discharge: 2022-01-15 | Disposition: A | Discharge status: Home / Self Care | Payer: 59 | Source: Ambulatory Visit | Attending: Radiation Oncology | Admitting: Radiation Oncology

## 2022-01-15 ENCOUNTER — Other Ambulatory Visit: Payer: Self-pay

## 2022-01-15 DIAGNOSIS — Z79811 Long term (current) use of aromatase inhibitors: Secondary | ICD-10-CM | POA: Diagnosis not present

## 2022-01-15 DIAGNOSIS — Z510 Encounter for antineoplastic radiation therapy: Secondary | ICD-10-CM | POA: Diagnosis not present

## 2022-01-15 DIAGNOSIS — Z170 Estrogen receptor positive status [ER+]: Secondary | ICD-10-CM | POA: Diagnosis not present

## 2022-01-15 DIAGNOSIS — D0512 Intraductal carcinoma in situ of left breast: Secondary | ICD-10-CM | POA: Diagnosis not present

## 2022-01-15 LAB — RAD ONC ARIA SESSION SUMMARY
Course Elapsed Days: 36
Plan Fractions Treated to Date: 26
Plan Prescribed Dose Per Fraction: 1.8 Gy
Plan Total Fractions Prescribed: 28
Plan Total Prescribed Dose: 50.4 Gy
Reference Point Dosage Given to Date: 46.8 Gy
Reference Point Session Dosage Given: 1.8 Gy
Session Number: 26

## 2022-01-16 ENCOUNTER — Ambulatory Visit
Admission: RE | Admit: 2022-01-16 | Discharge: 2022-01-16 | Disposition: A | Discharge status: Home / Self Care | Payer: 59 | Source: Ambulatory Visit | Attending: Radiation Oncology | Admitting: Radiation Oncology

## 2022-01-16 ENCOUNTER — Other Ambulatory Visit: Payer: Self-pay

## 2022-01-16 DIAGNOSIS — D0512 Intraductal carcinoma in situ of left breast: Secondary | ICD-10-CM | POA: Diagnosis not present

## 2022-01-16 DIAGNOSIS — Z79811 Long term (current) use of aromatase inhibitors: Secondary | ICD-10-CM | POA: Diagnosis not present

## 2022-01-16 DIAGNOSIS — Z510 Encounter for antineoplastic radiation therapy: Secondary | ICD-10-CM | POA: Diagnosis not present

## 2022-01-16 DIAGNOSIS — Z170 Estrogen receptor positive status [ER+]: Secondary | ICD-10-CM | POA: Diagnosis not present

## 2022-01-16 LAB — RAD ONC ARIA SESSION SUMMARY
Course Elapsed Days: 37
Plan Fractions Treated to Date: 27
Plan Prescribed Dose Per Fraction: 1.8 Gy
Plan Total Fractions Prescribed: 28
Plan Total Prescribed Dose: 50.4 Gy
Reference Point Dosage Given to Date: 48.6 Gy
Reference Point Session Dosage Given: 1.8 Gy
Session Number: 27

## 2022-01-19 ENCOUNTER — Other Ambulatory Visit: Payer: Self-pay

## 2022-01-19 ENCOUNTER — Ambulatory Visit
Admission: RE | Admit: 2022-01-19 | Discharge: 2022-01-19 | Disposition: A | Discharge status: Home / Self Care | Payer: 59 | Source: Ambulatory Visit | Attending: Radiation Oncology | Admitting: Radiation Oncology

## 2022-01-19 DIAGNOSIS — Z510 Encounter for antineoplastic radiation therapy: Secondary | ICD-10-CM | POA: Diagnosis not present

## 2022-01-19 DIAGNOSIS — Z79811 Long term (current) use of aromatase inhibitors: Secondary | ICD-10-CM | POA: Diagnosis not present

## 2022-01-19 DIAGNOSIS — D0512 Intraductal carcinoma in situ of left breast: Secondary | ICD-10-CM | POA: Diagnosis not present

## 2022-01-19 DIAGNOSIS — Z170 Estrogen receptor positive status [ER+]: Secondary | ICD-10-CM | POA: Diagnosis not present

## 2022-01-19 LAB — RAD ONC ARIA SESSION SUMMARY
Course Elapsed Days: 40
Plan Fractions Treated to Date: 28
Plan Prescribed Dose Per Fraction: 1.8 Gy
Plan Total Fractions Prescribed: 28
Plan Total Prescribed Dose: 50.4 Gy
Reference Point Dosage Given to Date: 50.4 Gy
Reference Point Session Dosage Given: 1.8 Gy
Session Number: 28

## 2022-01-20 ENCOUNTER — Ambulatory Visit: Payer: 59 | Admitting: Physical Therapy

## 2022-01-20 ENCOUNTER — Other Ambulatory Visit: Payer: Self-pay

## 2022-01-20 ENCOUNTER — Ambulatory Visit
Admission: RE | Admit: 2022-01-20 | Discharge: 2022-01-20 | Disposition: A | Discharge status: Home / Self Care | Payer: 59 | Source: Ambulatory Visit | Attending: Radiation Oncology | Admitting: Radiation Oncology

## 2022-01-20 DIAGNOSIS — Z483 Aftercare following surgery for neoplasm: Secondary | ICD-10-CM

## 2022-01-20 DIAGNOSIS — D0512 Intraductal carcinoma in situ of left breast: Secondary | ICD-10-CM | POA: Diagnosis not present

## 2022-01-20 DIAGNOSIS — R293 Abnormal posture: Secondary | ICD-10-CM | POA: Diagnosis not present

## 2022-01-20 DIAGNOSIS — M25612 Stiffness of left shoulder, not elsewhere classified: Secondary | ICD-10-CM | POA: Diagnosis not present

## 2022-01-20 DIAGNOSIS — C50912 Malignant neoplasm of unspecified site of left female breast: Secondary | ICD-10-CM

## 2022-01-20 DIAGNOSIS — Z510 Encounter for antineoplastic radiation therapy: Secondary | ICD-10-CM | POA: Diagnosis not present

## 2022-01-20 DIAGNOSIS — Z170 Estrogen receptor positive status [ER+]: Secondary | ICD-10-CM | POA: Diagnosis not present

## 2022-01-20 DIAGNOSIS — Z79811 Long term (current) use of aromatase inhibitors: Secondary | ICD-10-CM | POA: Diagnosis not present

## 2022-01-20 LAB — RAD ONC ARIA SESSION SUMMARY
Course Elapsed Days: 41
Plan Fractions Treated to Date: 1
Plan Prescribed Dose Per Fraction: 2 Gy
Plan Total Fractions Prescribed: 8
Plan Total Prescribed Dose: 16 Gy
Reference Point Dosage Given to Date: 52.4 Gy
Reference Point Session Dosage Given: 2 Gy
Session Number: 29

## 2022-01-20 NOTE — Therapy (Signed)
OUTPATIENT PHYSICAL THERAPY ONCOLOGY TREATMENT     Patient Name: [name] MRN: [id number] DOB:[birth year], 51 y.o., female Today's Date: 12/08/2021               Past Medical History:  Diagnosis Date   Asthma      followed by pcp---  pt stated mild seasonal asthma   Cancer ([place]) 10/2021    left breast DCIS   Female cystocele      symptomatic   GAD (generalized anxiety disorder)     GERD (gastroesophageal reflux disease)     Hiatal hernia     History of 2019 novel coronavirus disease (COVID-19) 07/2020    per pt had mild symptoms that resolved   MDD (major depressive disorder)     Mixed incontinence urge and stress     Wears glasses     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[place])      [facility]---  dx 2015,  takes methotrexate wkly, pt stated she is stable         Past Surgical History:  Procedure Laterality Date   ANTERIOR AND POSTERIOR REPAIR N/A 01/14/2021    Procedure: ANTERIOR (CYSTOCELE) AND POSTERIOR REPAIR (RECTOCELE);  Surgeon: Cheri Fowler, MD;  Location: [facility];  Service: Gynecology;  Laterality: N/A;   AUGMENTATION MAMMAPLASTY Bilateral 2010    saline   BREAST BIOPSY Left 10/23/2021    Stereo Bx, X-clip, path pending   BREAST ENHANCEMENT SURGERY   2009   BREAST LUMPECTOMY WITH RADIOACTIVE SEED AND SENTINEL LYMPH NODE BIOPSY Left 11/12/2021    Procedure: LEFT BREAST LUMPECTOMY WITH RADIOACTIVE SEED AND SENTINEL LYMPH NODE BIOPSY;  Surgeon: Stark Klein, MD;  Location: [place];  Service: General;  Laterality: Left;   CARPAL TUNNEL RELEASE Right 2011   LAPAROSCOPIC SUPRACERVICAL HYSTERECTOMY   02/21/2010    @ [place] by Dr Willis Modena   NASAL SEPTUM SURGERY   01/2018    insertion button prosthesis for septal perforation   RHINOPLASTY   2005    nasal fracture        Patient Active Problem List    Diagnosis Date Noted   Ductal carcinoma in situ (DCIS) of left breast  10/31/2021   Cystocele with prolapse 01/14/2021   Cyst of left ovary 09/14/2020   Female bladder prolapse- likely.  09/13/2020   Cervix prolapsed into vagina 09/13/2020   Dysuria 09/13/2020   Genetic testing 09/10/2020   Anxiety 06/18/2020   Mild intermittent asthma without complication 55/37/4827   Bloody diarrhea 06/09/2016   Major depressive disorder, recurrent, severe without psychotic features ([place])     Severe recurrent major depression w/psychotic features, mood-congruent ([place]) 05/04/2014   Major depression 05/04/2014   Positive ANA (antinuclear antibody) 04/10/2014   Granulomatosis with polyangiitis with pulmonary involvement ([place]) 02/07/2014   Generalized anxiety disorder 02/07/2014   Insomnia 02/07/2014   Sensorineural hearing loss of right ear 12/06/2013   Dermatitis 12/05/2013   Nasal septal perforation 12/05/2013   Allergic rhinitis 10/12/2006   Asthma 10/12/2006   GERD 10/12/2006      PCP: [name], MD   REFERRING PROVIDER: Stark Klein, MD    REFERRING DIAG:  C50.919 (ICD-10-CM) - Invasive carcinoma of breast ([place])   THERAPY DIAG:  Abnormal posture   Malignant neoplasm of left breast in female, estrogen receptor positive, unspecified site of breast [facility])   Rationale for Evaluation and Treatment Rehabilitation   ONSET DATE: 10/23/21   SUBJECTIVE:  SUBJECTIVE STATEMENT:  Pt states she has a sleeve and hand piece.  She is having difficulty with burning from radiation with skin discoloration and draining form open area on breast   I have 8 more treatments. She continues to be active is still working and is having difficulty wearing a bra. She feels the cording is better.   PERTINENT HISTORY:  Patient was diagnosed on 10/23/21 with left DCIS (grade to be determined later). It measures 1.61m and is located in the  upper outer quadrant. It is ER/PR +, HER2 -. Underwent a L lumpectomy and SLNB (0/3) on 11/12/21, Radiation ongoing    PATIENT GOALS:  Reassess how my recovery is going related to arm function, pain, and swelling.   PAIN:  Are you having pain? Yes  in right upper quadrant from radiation 6/10. It gets worse with clothes or anything touching it. 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[place] pt starts radiation on Wednesday     OBJECTIVE  LYMPHEDEMA ASSESSMENT:    UPPER EXTREMITY AROM/PROM:   A/PROM RIGHT  11/11/2021    Shoulder extension 75  Shoulder flexion 169  Shoulder abduction 168  Shoulder internal rotation 62  Shoulder external rotation 88                          (Blank rows = not tested)   A/PROM LEFT  11/11/2021 L 12/08/21 L 12/15/21  Shoulder extension 58 65   Shoulder flexion 172 125 175  Shoulder abduction 165 99 174 ( cording plg visible and palpabe at axilla   Shoulder internal rotation 70 68   Shoulder external rotation 88 68 90                          (Blank rows = not tested)    LYMPHEDEMA ASSESSMENTS:    LANDMARK RIGHT  11/11/2021  10 cm proximal to olecranon process 28.5  Olecranon process 27  10 cm proximal to ulnar styloid process 20.5  Just proximal to ulnar styloid process 15.2  Across hand at thumb web space 19.5  At base of 2nd digit 6.6  (Blank rows = not tested)   LANDMARK LEFT  11/11/2021 L 12/08/21  LEFT 01/05/22  10 cm proximal to olecranon process 28.5 29 28.5 28.5  Olecranon process 26 25.3 26.'2 26  10 ' cm proximal to ulnar styloid process 21.4 20.4 20.5 21.5  Just proximal to ulnar styloid process 15.3 14.8 15.7 16.1  Across hand at thumb web space 18 17.5 18 17.5  At base of 2nd digit 6.4 6.1 6.3 6  (Blank rows = not tested)      PATIENT EDUCATION:  Education details: added lower trunk rotation and open book stretch  with elbow bent.  Showed compression bras and where to get them, try tg soft on elbow  Person educated: Patient Education method: Explanation and Handouts Education comprehension: verbalized understanding     HOME EXERCISE PROGRAM:            Reviewed previously given post op HEP. Added lower trunk rotation and open book with folded arm    TREATMENT: Today's treatment  01/20/2022:   Pt demonstrated high level of skin irritation from radiation.  She received MLD today to areas outside of radiation field  with extra time spent on posterior interaxillary anastamosis for parasympathetic stimulation as well as drainage from breast . She will monitor self to see if she has decreased drainage from opening on breast today. No evidence of cording or congestion in left arm     01/14/22 Pt is starting to have red fragile skin in the axilla and breast so MFR was held in the axilla but was continued in the upper arm towards the lateral antecubital fossa.  P/ROM of Lt shoulder into flexion and abduction but only briefly due to redness MLD to the Lt UE avoiding axilla and anterior chest: In supine: Short neck, 5 diaphragmatic breaths, R axillary nodes and establishment of interaxillary pathway, L inguinal nodes and establishment of axilloinguinal pathway, then L UE working proximal to distal, moving fluid from upper inner arm outwards, and doing both sides of forearm moving fluid towards pathways spending extra time in any areas of fibrosis then retracing all steps     01/05/22 Manual Therapy Myofascial release to cording in Lt elbow and upper arm with cording less visible in forearm but still 2 cords visible and palpable in lateral antecubital fossa. Also worked on myofascial release to cording in L axilla with at least 2 cords palpable in this area. P/ROM of Lt shoulder into flexion and abduction  Measured pt for a compression sleeve and glove. Was able to give pt a donated sleeve since it was her size and  pt is going to order the compression glove. She fits in to a size 3 Medi Harmony 20-81mHg.   01/01/22 Manual Therapy Myofascial release to cording in Lt elbow and upper arm with cording less visible in forearm but still 2 cords visible and palpable in lateral antecubital fossa. Also worked on myofascial release to cording in L axilla with at least 2 cords palpable in this area. P/ROM of Lt shoulder into flexion and abduction  12/29/21 Manual Therapy Myofascial release to cording in Lt elbow and upper arm with cording less visible in forearm but still 2 cords visible and palpable in lateral antecubital fossa MLD was completed to LUE as follows: in supine: short neck, right axillary nodes, establishment of interaxillary pathway, left inguinal nodes and establishment of axillo inguinal pathway, LUE working proximal to distal then retracing all steps.  P/ROM of Lt shoulder into flexion and abduction  12/25/21 Manual Therapy Myofascial release to cording in Lt elbow and upper arm. MLD was completed to LUE as follows: in supine: short neck, right axillary nodes, establishment of interaxillary pathway, left inguinal nodes and establishment of axillo inguinal pathway, LUE working proximal to distal then retracing all steps.  P/ROM of Lt shoulder into flexion, abduction and D2 to pts tolerance.      ASSESSMENT:  CLINICAL IMPRESSION:  Pt is having increased skin irritation as she reaches the end of radiation and she is having drainage from small spot on breast which is covered with thick yellow eschar.  If she received relief from MLD today she would benefit from instruction to her her husband who would be able to perform stationary circles to her posterior interaxillary anastamosis.  At that time, pt could put PT on hold and come back as needed as she may be at risk for breast lymphedema     Pt will benefit from skilled therapeutic intervention to improve on the following deficits: Decreased knowledge  of precautions, impaired UE functional use, pain, decreased ROM, postural dysfunction.    PT treatment/interventions: ADL/Self care home management, Therapeutic  exercises, Patient/Family education, Orthotic/Fit training, Manual lymph drainage, Compression bandaging, Vasopneumatic device, and Manual therapy         GOALS: Goals reviewed with patient? Yes   LONG TERM GOALS:  (STG=LTG)   GOALS Name Target Date   Goal status  1 Pt will demonstrate she has regained full shoulder ROM and function post operatively compared to baselines.  Baseline: 01/05/2022 MET  2 Pt will demonstrate 170 degrees of L shoulder flexion to allow pt to reach overhead.  Baseline: 125 01/05/2022 MET  3 Pt will demonstrate 165 degrees of L shoulder abduction to allow pt to reach out to the side. Baseline: 99 01/05/2022 MET  4 Pt will report she is able to raise arm to end range in all directions without discomfort from cording.  Met  5  Huisband will be independent in MLD for home management  01/27/2022 New        PLAN: PT FREQUENCY/DURATION: 2x/wk for 4 wks   PLAN FOR NEXT SESSION: Teach husband MLD with emphasis on posterior interaxillary anastamosis.   She has a [facility] screen scheduled for 02/16/2022 and will determine at that time if PT needs to be resumed    get new garment? Check fit, may need to hold due to radiation, continue myofascial release for cording, PROM to L shoulder, MLD to LUE,    Donato Heinz. Owens Shark, PT  01/20/22 9:11 AM

## 2022-01-21 ENCOUNTER — Ambulatory Visit
Admission: RE | Admit: 2022-01-21 | Discharge: 2022-01-21 | Disposition: A | Discharge status: Home / Self Care | Payer: 59 | Source: Ambulatory Visit | Attending: Radiation Oncology | Admitting: Radiation Oncology

## 2022-01-21 ENCOUNTER — Other Ambulatory Visit: Payer: Self-pay

## 2022-01-21 DIAGNOSIS — D0512 Intraductal carcinoma in situ of left breast: Secondary | ICD-10-CM | POA: Diagnosis not present

## 2022-01-21 DIAGNOSIS — Z510 Encounter for antineoplastic radiation therapy: Secondary | ICD-10-CM | POA: Diagnosis not present

## 2022-01-21 DIAGNOSIS — Z79811 Long term (current) use of aromatase inhibitors: Secondary | ICD-10-CM | POA: Diagnosis not present

## 2022-01-21 DIAGNOSIS — Z170 Estrogen receptor positive status [ER+]: Secondary | ICD-10-CM | POA: Diagnosis not present

## 2022-01-21 LAB — RAD ONC ARIA SESSION SUMMARY
Course Elapsed Days: 42
Plan Fractions Treated to Date: 2
Plan Prescribed Dose Per Fraction: 2 Gy
Plan Total Fractions Prescribed: 8
Plan Total Prescribed Dose: 16 Gy
Reference Point Dosage Given to Date: 54.4 Gy
Reference Point Session Dosage Given: 2 Gy
Session Number: 30

## 2022-01-22 ENCOUNTER — Ambulatory Visit
Admission: RE | Admit: 2022-01-22 | Discharge: 2022-01-22 | Disposition: A | Discharge status: Home / Self Care | Payer: 59 | Source: Ambulatory Visit | Attending: Radiation Oncology | Admitting: Radiation Oncology

## 2022-01-22 ENCOUNTER — Other Ambulatory Visit: Payer: Self-pay

## 2022-01-22 DIAGNOSIS — Z510 Encounter for antineoplastic radiation therapy: Secondary | ICD-10-CM | POA: Diagnosis not present

## 2022-01-22 DIAGNOSIS — D0512 Intraductal carcinoma in situ of left breast: Secondary | ICD-10-CM | POA: Diagnosis not present

## 2022-01-22 DIAGNOSIS — Z79811 Long term (current) use of aromatase inhibitors: Secondary | ICD-10-CM | POA: Diagnosis not present

## 2022-01-22 DIAGNOSIS — Z170 Estrogen receptor positive status [ER+]: Secondary | ICD-10-CM | POA: Diagnosis not present

## 2022-01-22 LAB — RAD ONC ARIA SESSION SUMMARY
Course Elapsed Days: 43
Plan Fractions Treated to Date: 3
Plan Prescribed Dose Per Fraction: 2 Gy
Plan Total Fractions Prescribed: 8
Plan Total Prescribed Dose: 16 Gy
Reference Point Dosage Given to Date: 56.4 Gy
Reference Point Session Dosage Given: 2 Gy
Session Number: 31

## 2022-01-23 ENCOUNTER — Ambulatory Visit
Admission: RE | Admit: 2022-01-23 | Discharge: 2022-01-23 | Disposition: A | Discharge status: Home / Self Care | Payer: 59 | Source: Ambulatory Visit | Attending: Radiation Oncology | Admitting: Radiation Oncology

## 2022-01-23 ENCOUNTER — Other Ambulatory Visit: Payer: Self-pay

## 2022-01-23 DIAGNOSIS — Z170 Estrogen receptor positive status [ER+]: Secondary | ICD-10-CM | POA: Diagnosis not present

## 2022-01-23 DIAGNOSIS — D0512 Intraductal carcinoma in situ of left breast: Secondary | ICD-10-CM | POA: Diagnosis not present

## 2022-01-23 DIAGNOSIS — Z79811 Long term (current) use of aromatase inhibitors: Secondary | ICD-10-CM | POA: Diagnosis not present

## 2022-01-23 DIAGNOSIS — Z510 Encounter for antineoplastic radiation therapy: Secondary | ICD-10-CM | POA: Diagnosis not present

## 2022-01-23 LAB — RAD ONC ARIA SESSION SUMMARY
Course Elapsed Days: 44
Plan Fractions Treated to Date: 4
Plan Prescribed Dose Per Fraction: 2 Gy
Plan Total Fractions Prescribed: 8
Plan Total Prescribed Dose: 16 Gy
Reference Point Dosage Given to Date: 58.4 Gy
Reference Point Session Dosage Given: 2 Gy
Session Number: 32

## 2022-01-26 ENCOUNTER — Other Ambulatory Visit: Payer: Self-pay

## 2022-01-26 ENCOUNTER — Ambulatory Visit
Admission: RE | Admit: 2022-01-26 | Discharge: 2022-01-26 | Disposition: A | Discharge status: Home / Self Care | Payer: 59 | Source: Ambulatory Visit | Attending: Radiation Oncology | Admitting: Radiation Oncology

## 2022-01-26 DIAGNOSIS — Z510 Encounter for antineoplastic radiation therapy: Secondary | ICD-10-CM | POA: Diagnosis not present

## 2022-01-26 DIAGNOSIS — D0512 Intraductal carcinoma in situ of left breast: Secondary | ICD-10-CM | POA: Diagnosis not present

## 2022-01-26 DIAGNOSIS — Z170 Estrogen receptor positive status [ER+]: Secondary | ICD-10-CM | POA: Diagnosis not present

## 2022-01-26 DIAGNOSIS — Z79811 Long term (current) use of aromatase inhibitors: Secondary | ICD-10-CM | POA: Diagnosis not present

## 2022-01-26 LAB — RAD ONC ARIA SESSION SUMMARY
Course Elapsed Days: 47
Plan Fractions Treated to Date: 5
Plan Prescribed Dose Per Fraction: 2 Gy
Plan Total Fractions Prescribed: 8
Plan Total Prescribed Dose: 16 Gy
Reference Point Dosage Given to Date: 60.4 Gy
Reference Point Session Dosage Given: 2 Gy
Session Number: 33

## 2022-01-27 ENCOUNTER — Other Ambulatory Visit: Payer: Self-pay

## 2022-01-27 ENCOUNTER — Ambulatory Visit: Payer: 59 | Admitting: Physical Therapy

## 2022-01-27 ENCOUNTER — Ambulatory Visit
Admission: RE | Admit: 2022-01-27 | Discharge: 2022-01-27 | Disposition: A | Discharge status: Home / Self Care | Payer: 59 | Source: Ambulatory Visit | Attending: Radiation Oncology | Admitting: Radiation Oncology

## 2022-01-27 ENCOUNTER — Inpatient Hospital Stay: Payer: 59

## 2022-01-27 ENCOUNTER — Other Ambulatory Visit: Payer: Self-pay | Admitting: *Deleted

## 2022-01-27 DIAGNOSIS — C50912 Malignant neoplasm of unspecified site of left female breast: Secondary | ICD-10-CM

## 2022-01-27 DIAGNOSIS — D0512 Intraductal carcinoma in situ of left breast: Secondary | ICD-10-CM | POA: Diagnosis not present

## 2022-01-27 DIAGNOSIS — R293 Abnormal posture: Secondary | ICD-10-CM

## 2022-01-27 DIAGNOSIS — Z483 Aftercare following surgery for neoplasm: Secondary | ICD-10-CM

## 2022-01-27 DIAGNOSIS — M25612 Stiffness of left shoulder, not elsewhere classified: Secondary | ICD-10-CM

## 2022-01-27 DIAGNOSIS — Z79811 Long term (current) use of aromatase inhibitors: Secondary | ICD-10-CM | POA: Diagnosis not present

## 2022-01-27 DIAGNOSIS — Z510 Encounter for antineoplastic radiation therapy: Secondary | ICD-10-CM | POA: Diagnosis not present

## 2022-01-27 DIAGNOSIS — Z170 Estrogen receptor positive status [ER+]: Secondary | ICD-10-CM | POA: Diagnosis not present

## 2022-01-27 LAB — CBC
HCT: 38.4 % (ref 36.0–46.0)
Hemoglobin: 12.5 g/dL (ref 12.0–15.0)
MCH: 29.2 pg (ref 26.0–34.0)
MCHC: 32.6 g/dL (ref 30.0–36.0)
MCV: 89.7 fL (ref 80.0–100.0)
Platelets: 247 10*3/uL (ref 150–400)
RBC: 4.28 MIL/uL (ref 3.87–5.11)
RDW: 14.9 % (ref 11.5–15.5)
WBC: 6.3 10*3/uL (ref 4.0–10.5)
nRBC: 0 % (ref 0.0–0.2)

## 2022-01-27 LAB — RAD ONC ARIA SESSION SUMMARY
Course Elapsed Days: 48
Plan Fractions Treated to Date: 6
Plan Prescribed Dose Per Fraction: 2 Gy
Plan Total Fractions Prescribed: 8
Plan Total Prescribed Dose: 16 Gy
Reference Point Dosage Given to Date: 62.4 Gy
Reference Point Session Dosage Given: 2 Gy
Session Number: 34

## 2022-01-27 MED ORDER — CEPHALEXIN 500 MG PO CAPS
500.0000 mg | ORAL_CAPSULE | Freq: Three times a day (TID) | ORAL | 0 refills | Status: DC
  Filled 2022-01-27: qty 21, 7d supply, fill #0

## 2022-01-27 MED ORDER — HYDROXYZINE PAMOATE 25 MG PO CAPS
ORAL_CAPSULE | ORAL | 0 refills | Status: DC
  Filled 2022-01-27 – 2022-03-05 (×2): qty 90, 90d supply, fill #0

## 2022-01-27 MED ORDER — SERTRALINE HCL 100 MG PO TABS
ORAL_TABLET | ORAL | 0 refills | Status: DC
  Filled 2022-01-27: qty 140, 93d supply, fill #0
  Filled 2022-03-13: qty 135, 90d supply, fill #0
  Filled 2022-06-15 – 2022-07-03 (×2): qty 135, 90d supply, fill #1

## 2022-01-27 MED ORDER — PANTOPRAZOLE SODIUM 40 MG PO TBEC: DELAYED_RELEASE_TABLET | ORAL | 0 refills | Status: DC

## 2022-01-27 MED ORDER — PANTOPRAZOLE SODIUM 40 MG PO TBEC
DELAYED_RELEASE_TABLET | ORAL | 0 refills | Status: DC
  Filled 2022-01-27: qty 180, 90d supply, fill #0

## 2022-01-27 NOTE — Therapy (Addendum)
OUTPATIENT PHYSICAL THERAPY ONCOLOGY TREATMENT     Patient Name: [name] MRN: [id number] DOB:Mar 25, 1971, 51 y.o., female Today's Date: 12/08/2021    PT End of Session - 01/27/22 0942     Visit Number 12    Number of Visits 18    Date for PT Re-Evaluation 02/02/22    PT Start Time 0900    PT Stop Time 0944    PT Time Calculation (min) 44 min    Activity Tolerance Patient tolerated treatment well    Behavior During Therapy [facility] for tasks assessed/performed                       Past Medical History:  Diagnosis Date   Asthma      followed by pcp---  pt stated mild seasonal asthma   Cancer ([place]) 10/2021    left breast DCIS   Female cystocele      symptomatic   GAD (generalized anxiety disorder)     GERD (gastroesophageal reflux disease)     Hiatal hernia     History of 2019 novel coronavirus disease (COVID-19) 07/2020    per pt had mild symptoms that resolved   MDD (major depressive disorder)     Mixed incontinence urge and stress     Wears glasses     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[place])      [facility]---  dx 2015,  takes methotrexate wkly, pt stated she is stable         Past Surgical History:  Procedure Laterality Date   ANTERIOR AND POSTERIOR REPAIR N/A 01/14/2021    Procedure: ANTERIOR (CYSTOCELE) AND POSTERIOR REPAIR (RECTOCELE);  Surgeon: Cheri Fowler, MD;  [facility]  Service: Gynecology;  Laterality: N/A;   AUGMENTATION MAMMAPLASTY Bilateral 2010    saline   BREAST BIOPSY Left 10/23/2021    Stereo Bx, X-clip, path pending   BREAST ENHANCEMENT SURGERY   2009   BREAST LUMPECTOMY WITH RADIOACTIVE SEED AND SENTINEL LYMPH NODE BIOPSY Left 11/12/2021    Procedure: LEFT BREAST LUMPECTOMY WITH RADIOACTIVE SEED AND SENTINEL LYMPH NODE BIOPSY;  Surgeon: Stark Klein, MD;  Location: [place];  Service: General;  Laterality: Left;   CARPAL TUNNEL RELEASE Right  2011   LAPAROSCOPIC SUPRACERVICAL HYSTERECTOMY   02/21/2010    @ [place] by Dr Willis Modena   NASAL SEPTUM SURGERY   01/2018    insertion button prosthesis for septal perforation   RHINOPLASTY   2005    nasal fracture        Patient Active Problem List    Diagnosis Date Noted   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  09/13/2020   Cervix prolapsed into vagina 09/13/2020   Dysuria 09/13/2020   Genetic testing 09/10/2020   Anxiety 06/18/2020   Mild intermittent asthma without complication 52/84/1324   Bloody diarrhea 06/09/2016   Major depressive disorder, recurrent, severe without psychotic features ([place])     Severe recurrent major depression w/psychotic features, mood-congruent ([place]) 05/04/2014   Major depression 05/04/2014   Positive ANA (antinuclear antibody) 04/10/2014   Granulomatosis with polyangiitis with pulmonary involvement ([place]) 02/07/2014   Generalized anxiety disorder 02/07/2014   Insomnia 02/07/2014   Sensorineural hearing loss of right ear 12/06/2013   Dermatitis 12/05/2013   Nasal septal perforation 12/05/2013   Allergic rhinitis 10/12/2006  Asthma 10/12/2006   GERD 10/12/2006      PCP: [name], MD   REFERRING PROVIDER: Stark Klein, MD    REFERRING DIAG:  [phone] (ICD-10-CM) - Invasive carcinoma of breast ([place])   THERAPY DIAG:  Abnormal posture   Malignant neoplasm of left breast in female, estrogen receptor positive, unspecified site of breast ([place])   Rationale for Evaluation and Treatment Rehabilitation   ONSET DATE: 10/23/21   SUBJECTIVE:                                                                                                                                                       SUBJECTIVE STATEMENT:  Pt noticed that the drainage from the spot on her breast is milky and has a smell to it.  She has more pain but feels that  it might be from the boost radiation that she has started.  Her husband is here to learn MLD. She felt that she got some relief from it after last session . She has 3 more radiation treatments and hopes to see the doctor today. She does not think she has a fever.  She feels that this can be her last treatment form PT and she knows to come back for SOZO screening on 02/16/2022   PERTINENT HISTORY:  Patient was diagnosed on 10/23/21 with left DCIS (grade to be determined later). It measures 1.71m and is located in the upper outer quadrant. It is ER/PR +, HER2 -. Underwent a L lumpectomy and SLNB (0/3) on 11/12/21, Radiation to be completed 01/29/2022    PATIENT GOALS:  Reassess how my recovery is going related to arm function, pain, and swelling.   PAIN:  Are you having pain? Yes  4/10 in left breast  PRECAUTIONS: Recent Surgery, left UE Lymphedema risk,    ACTIVITY LEVEL / LEISURE: pt has returned [place] full [place] on Wednesday     OBJECTIVE  Pt with swelling and redness of breast with copious clear and milky drainage from small wound     UPPER EXTREMITY AROM/PROM:   A/PROM RIGHT  11/11/2021    Shoulder extension 75  Shoulder flexion 169  Shoulder abduction 168  Shoulder internal rotation 62  Shoulder external rotation 88                          (Blank rows = not tested)   A/PROM LEFT  11/11/2021 L 12/08/21 L 12/15/21 L 01/27/22  Shoulder extension 58 65    Shoulder flexion 172 125 175 175  Shoulder abduction 165 99 174 ( cording plg visible and palpabe at axilla  175 No cording   Shoulder internal rotation 70 68    Shoulder external rotation 88 68 90                           (  Blank rows = not tested)    LYMPHEDEMA ASSESSMENTS:    LANDMARK RIGHT  11/11/2021  10 cm proximal to olecranon process 28.5  Olecranon process 27  10 cm proximal to ulnar styloid process 20.5  Just proximal to ulnar styloid process 15.2  Across hand at thumb web space  19.5  At base of 2nd digit 6.6  (Blank rows = not tested)   LANDMARK LEFT  11/11/2021 L 12/08/21  LEFT 01/05/22  10 cm proximal to olecranon process 28.5 29 28.5 28.5  Olecranon process 26 25.3 26.'2 26  10 ' cm proximal to ulnar styloid process 21.4 20.4 20.5 21.5  Just proximal to ulnar styloid process 15.3 14.8 15.7 16.1  Across hand at thumb web space 18 17.5 18 17.5  At base of 2nd digit 6.4 6.1 6.3 6  (Blank rows = not tested)      PATIENT EDUCATION:  Education details: self manual lymph drainage, review of exercise  Person educated: Patient and husband  Education method: Explanation and Handout with link for [employer] self care video for MLD  Education comprehension: verbalized and demonstrated understanding     HOME EXERCISE PROGRAM:           Verbally  Reviewed previously given post op HEP.   TREATMENT: 7/25/203:    pt has increased drainage and with change in color of exudate from wound on breast with reported smell, though it was not detectable this am. The fluid drains from her wound with mild pressure and it is red around it. ( See picture)  Treatment focused on reviewing  with patient and teaching husband MLD starting with short especially lateral trunk and posterior anastomosis  No  PT hands on treatment was done today due to possibility of cellulitis though husband did practice stroke briefly for posterior anastamosis. Stationary circles were practiced briefly on husband so he could feel the lightness of pressure .  Pt feels she is ready to DC from PT   .  01/20/2022:   Pt demonstrated high level of skin irritation from radiation.  She received MLD today to areas outside of radiation field with extra time spent on posterior interaxillary anastamosis for parasympathetic stimulation as well as drainage from breast . She will monitor self to see if she has decreased drainage from opening on breast today. No evidence of cording or congestion in left arm     01/14/22 Pt is  starting to have red fragile skin in the axilla and breast so MFR was held in the axilla but was continued in the upper arm towards the lateral antecubital fossa.  P/ROM of Lt shoulder into flexion and abduction but only briefly due to redness MLD to the Lt UE avoiding axilla and anterior chest: In supine: Short neck, 5 diaphragmatic breaths, R axillary nodes and establishment of interaxillary pathway, L inguinal nodes and establishment of axilloinguinal pathway, then L UE working proximal to distal, moving fluid from upper inner arm outwards, and doing both sides of forearm moving fluid towards pathways spending extra time in any areas of fibrosis then retracing all steps     01/05/22 Manual Therapy Myofascial release to cording in Lt elbow and upper arm with cording less visible in forearm but still 2 cords visible and palpable in lateral antecubital fossa. Also worked on myofascial release to cording in L axilla with at least 2 cords palpable in this area. P/ROM of Lt shoulder into flexion and abduction  Measured pt for a compression sleeve and glove.  Was able to give pt a donated sleeve since it was her size and pt is going to order the compression glove. She fits in to a size 3 [employer] 20-68mHg.   01/01/22 Manual Therapy Myofascial release to cording in Lt elbow and upper arm with cording less visible in forearm but still 2 cords visible and palpable in lateral antecubital fossa. Also worked on myofascial release to cording in L axilla with at least 2 cords palpable in this area. P/ROM of Lt shoulder into flexion and abduction  12/29/21 Manual Therapy Myofascial release to cording in Lt elbow and upper arm with cording less visible in forearm but still 2 cords visible and palpable in lateral antecubital fossa MLD was completed to LUE as follows: in supine: short neck, right axillary nodes, establishment of interaxillary pathway, left inguinal nodes and establishment of axillo inguinal  pathway, LUE working proximal to distal then retracing all steps.  P/ROM of Lt shoulder into flexion and abduction  12/25/21 Manual Therapy Myofascial release to cording in Lt elbow and upper arm. MLD was completed to LUE as follows: in supine: short neck, right axillary nodes, establishment of interaxillary pathway, left inguinal nodes and establishment of axillo inguinal pathway, LUE working proximal to distal then retracing all steps.  P/ROM of Lt shoulder into flexion, abduction and D2 to pts tolerance.      ASSESSMENT:  CLINICAL IMPRESSION:   Pt has possbile cellulits of breast and has skin fragility due to end of radiation so no hands on treatment for now. She and husband are knowledgeable about how to  manage lymphedema with exercise and MLD should her symptoms persist after skin is healed from radiation.  [facility]    Pt will benefit from skilled therapeutic intervention to improve on the following deficits: Decreased knowledge of precautions, impaired UE functional use, pain, decreased ROM, postural dysfunction.    PT treatment/interventions: ADL/Self care home management, Therapeutic exercises, Patient/Family education, Orthotic/Fit training, Manual lymph drainage, Compression bandaging, Vasopneumatic device, and Manual therapy         GOALS: Goals reviewed with patient? Yes   LONG TERM GOALS:  (STG=LTG)   GOALS Name Target Date   Goal status  1 Pt will demonstrate she has regained full shoulder ROM and function post operatively compared to baselines.  Baseline: 01/05/2022 MET  2 Pt will demonstrate 170 degrees of L shoulder flexion to allow pt to reach overhead.  Baseline: 125 01/05/2022 MET  3 Pt will demonstrate 165 degrees of L shoulder abduction to allow pt to reach out to the side. Baseline: 99 01/05/2022 MET  4 Pt will report she is able to raise arm to end range in all directions without discomfort from cording.  Met  5  Huisband  will be independent in MLD for home management  01/27/2022 Met        PLAN: PT FREQUENCY/DURATION: 2x/wk for 4 wks   PLAN FOR NEXT SESSION:   She has a SOZO screen scheduled for 02/16/2022 and will determine at that time if PT needs to be resumed    get new garment?   PHYSICAL THERAPY DISCHARGE SUMMARY  Visits from Start of Care: 12 Current functional level related to goals / functional outcomes: Independent    Remaining deficits: Swelling and drainage in breast , to be checked by doctor  for possible cellulitis    Education / Equipment: Exercise and MLD   Patient agrees to discharge. Patient goals  were met. Patient is being discharged due to meeting the stated rehab goals.  Donato Heinz. Owens Shark, PT     Donato Heinz. Owens Shark, PT  01/27/22 9:59 AM

## 2022-01-27 NOTE — Patient Instructions (Signed)
[email or web address] Right of page: resources Self care videos Left UE MLD  left upper extremity manual lymph drainage

## 2022-01-28 ENCOUNTER — Other Ambulatory Visit: Payer: Self-pay

## 2022-01-28 ENCOUNTER — Ambulatory Visit
Admission: RE | Admit: 2022-01-28 | Discharge: 2022-01-28 | Disposition: A | Discharge status: Home / Self Care | Payer: 59 | Source: Ambulatory Visit | Attending: Radiation Oncology | Admitting: Radiation Oncology

## 2022-01-28 DIAGNOSIS — Z170 Estrogen receptor positive status [ER+]: Secondary | ICD-10-CM | POA: Diagnosis not present

## 2022-01-28 DIAGNOSIS — D0512 Intraductal carcinoma in situ of left breast: Secondary | ICD-10-CM | POA: Diagnosis not present

## 2022-01-28 DIAGNOSIS — Z79811 Long term (current) use of aromatase inhibitors: Secondary | ICD-10-CM | POA: Diagnosis not present

## 2022-01-28 DIAGNOSIS — Z510 Encounter for antineoplastic radiation therapy: Secondary | ICD-10-CM | POA: Diagnosis not present

## 2022-01-28 LAB — RAD ONC ARIA SESSION SUMMARY
Course Elapsed Days: 49
Plan Fractions Treated to Date: 7
Plan Prescribed Dose Per Fraction: 2 Gy
Plan Total Fractions Prescribed: 8
Plan Total Prescribed Dose: 16 Gy
Reference Point Dosage Given to Date: 64.4 Gy
Reference Point Session Dosage Given: 2 Gy
Session Number: 35

## 2022-01-29 ENCOUNTER — Encounter: Payer: 59 | Admitting: Physical Therapy

## 2022-01-29 ENCOUNTER — Other Ambulatory Visit: Payer: Self-pay

## 2022-01-29 ENCOUNTER — Ambulatory Visit
Admission: RE | Admit: 2022-01-29 | Discharge: 2022-01-29 | Disposition: A | Discharge status: Home / Self Care | Payer: 59 | Source: Ambulatory Visit | Attending: Radiation Oncology | Admitting: Radiation Oncology

## 2022-01-29 ENCOUNTER — Inpatient Hospital Stay (HOSPITAL_BASED_OUTPATIENT_CLINIC_OR_DEPARTMENT_OTHER): Payer: 59 | Admitting: Oncology

## 2022-01-29 VITALS — BP 113/70 | HR 86 | Temp 97.4°F | Resp 18

## 2022-01-29 DIAGNOSIS — D0512 Intraductal carcinoma in situ of left breast: Secondary | ICD-10-CM | POA: Diagnosis not present

## 2022-01-29 DIAGNOSIS — Z170 Estrogen receptor positive status [ER+]: Secondary | ICD-10-CM | POA: Diagnosis not present

## 2022-01-29 DIAGNOSIS — Z79811 Long term (current) use of aromatase inhibitors: Secondary | ICD-10-CM | POA: Diagnosis not present

## 2022-01-29 DIAGNOSIS — Z510 Encounter for antineoplastic radiation therapy: Secondary | ICD-10-CM | POA: Diagnosis not present

## 2022-01-29 LAB — RAD ONC ARIA SESSION SUMMARY
Course Elapsed Days: 50
Plan Fractions Treated to Date: 8
Plan Prescribed Dose Per Fraction: 2 Gy
Plan Total Fractions Prescribed: 8
Plan Total Prescribed Dose: 16 Gy
Reference Point Dosage Given to Date: 66.4 Gy
Reference Point Session Dosage Given: 2 Gy
Session Number: 36

## 2022-01-29 MED ORDER — LETROZOLE 2.5 MG PO TABS
2.5000 mg | ORAL_TABLET | Freq: Every day | ORAL | 3 refills | Status: DC
  Filled 2022-01-29: qty 30, 30d supply, fill #0
  Filled 2022-03-05: qty 30, 30d supply, fill #1
  Filled 2022-03-30 – 2022-04-10 (×2): qty 30, 30d supply, fill #2
  Filled 2022-05-03: qty 30, 30d supply, fill #3

## 2022-01-29 NOTE — Progress Notes (Signed)
[name]  [phone] [phone]) [phone]  ID: [name] OB: 02/01/1971  MR#: [id number]  OTR#:[id number]  Patient Care Team: [name] Jewel, MD as PCP - General (Internal Medicine)  CHIEF COMPLAINT: Left breast DCIS with small focus of invasive carcinoma.  INTERVAL HISTORY: Patient returns to clinic today at the conclusion of her XRT for further evaluation and discussion of initiating letrozole.  She currently feels well and is asymptomatic.  She reports mild skin breakdown and irritation at the site of her XRT, but otherwise tolerated her treatments well.  She has no neurologic complaints.  She denies any recent fevers or illnesses.  She has a good appetite and denies weight loss.  She has no chest pain, shortness of breath, cough, or hemoptysis.  She denies any nausea, vomiting, constipation, or diarrhea.  She has no urinary complaints.  Patient offers no specific complaints today.  REVIEW OF SYSTEMS:   Review of Systems  Constitutional: Negative.  Negative for fever, malaise/fatigue and weight loss.  Respiratory: Negative.  Negative for cough, hemoptysis and shortness of breath.   Cardiovascular:  Negative for chest pain and leg swelling.  Gastrointestinal: Negative.  Negative for abdominal pain.  Genitourinary: Negative.  Negative for dysuria.  Musculoskeletal: Negative.  Negative for back pain.  Skin: Negative.  Negative for rash.  Neurological: Negative.  Negative for dizziness, focal weakness, weakness and headaches.  Psychiatric/Behavioral: Negative.  The patient is not nervous/anxious.     As per HPI. Otherwise, a complete review of systems is negative.  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[place])    [facility]/[name]---  dx 2015,  takes methotrexate wkly, pt stated she is stable    PAST SURGICAL HISTORY: Past Surgical History:  Procedure Laterality Date   ANTERIOR AND POSTERIOR REPAIR N/A 01/14/2021   Procedure: ANTERIOR (CYSTOCELE) AND POSTERIOR REPAIR (RECTOCELE);  Surgeon: [name] Fowler, MD;  [facility]: [facility];  Service: Gynecology;  Laterality: N/A;   AUGMENTATION MAMMAPLASTY Bilateral 2010   saline   BREAST BIOPSY Left 10/23/2021   Stereo Bx, X-clip, path pending   BREAST ENHANCEMENT SURGERY  2009   BREAST LUMPECTOMY WITH RADIOACTIVE SEED AND SENTINEL LYMPH NODE BIOPSY Left 11/12/2021   Procedure: LEFT BREAST LUMPECTOMY WITH RADIOACTIVE SEED AND SENTINEL LYMPH NODE BIOPSY;  Surgeon: [name] Klein, MD;  Location: [facility];  Service: General;  Laterality: Left;   CARPAL TUNNEL RELEASE Right 2011   LAPAROSCOPIC SUPRACERVICAL HYSTERECTOMY  02/21/2010   @ [place] by Dr [name]   NASAL SEPTUM SURGERY  01/2018   insertion button prosthesis for septal perforation   RHINOPLASTY  2005   nasal fracture    FAMILY HISTORY: Family History  Problem Relation Age of Onset   Asthma Mother    Depression Mother    Diabetes Mother    Hyperlipidemia Mother    Hypertension Mother  Mental illness Mother    Arthritis Mother    Pancreatic cancer Mother    Heart disease Father 79       AMI/CABG   Mental illness Sister    Alzheimer's disease Maternal Grandmother    Hyperlipidemia Maternal Grandmother    Hypertension Maternal Grandmother    Heart attack Maternal Grandfather    Lung cancer Maternal Grandfather    Myasthenia gravis Daughter     Charcot-Marie-Tooth disease Daughter    ADD / ADHD Daughter    Pancreatic cancer Maternal Aunt     ADVANCED DIRECTIVES (Y/N):  N  HEALTH MAINTENANCE: Social History   Tobacco Use   Smoking status: Never   Smokeless tobacco: Never  Vaping Use   Vaping Use: Never used  Substance Use Topics   Alcohol use: Yes    Comment: occasional   Drug use: Never     Colonoscopy:  PAP:  Bone density:  Lipid panel:  Allergies  Allergen Reactions   Kiwi Extract Shortness Of Breath and Swelling   Ativan [Lorazepam] Other (See Comments)    Hyperactivity per patient   Erythromycin Base Nausea And Vomiting   Nitrofurantoin Nausea And Vomiting   Xanax [Alprazolam] Other (See Comments)    Makes patient hyperactive     Current Outpatient Medications  Medication Sig Dispense Refill   ADVAIR DISKUS 100-50 MCG/DOSE AEPB INHALE 1 PUFF INTO THE LUNGS TWICE DAILY 1 each 0   albuterol (VENTOLIN HFA) 108 (90 Base) MCG/ACT inhaler Inhale 2 puffs into the lungs every 6 (six) hours as needed for wheezing or shortness of breath (cough, shortness of breath or wheezing.). 1 each 1   cephALEXin (KEFLEX) 500 MG capsule Take 1 capsule (500 mg total) by mouth 3 (three) times daily. 21 capsule 0   folic acid (FOLVITE) 1 MG tablet Take 1 tablet (1 mg total) by mouth once daily 90 tablet 3   gabapentin (NEURONTIN) 300 MG capsule Take 1 capsule (300 mg total) by mouth at bedtime 30 capsule 5   hydroxychloroquine (PLAQUENIL) 200 MG tablet Take 1 tablet (200 mg total) by mouth 2 (two) times daily (Patient taking differently: Take 200 mg by mouth daily.) 60 tablet 5   hydrOXYzine (VISTARIL) 25 MG capsule Take 1 capsule (25 mg total) by mouth daily as needed. 90 capsule 0   letrozole (FEMARA) 2.5 MG tablet Take 1 tablet (2.5 mg total) by mouth daily. 30 tablet 3   methotrexate (RHEUMATREX) 2.5 MG tablet Take 8 tablets (20 mg total) by mouth every 7 (seven) days 96 tablet 1   montelukast (SINGULAIR) 10 MG tablet Take 1  tablet (10 mg total) by mouth daily. 30 tablet 6   pantoprazole (PROTONIX) 40 MG tablet TAKE ONE TABLET BY MOUTH TWICE A DAY BEFORE FOOD 180 tablet 0   sertraline (ZOLOFT) 100 MG tablet Take 1.5 tablets (150 mg total) by mouth daily. 140 tablet 0   sucralfate (CARAFATE) 1 g tablet as needed.     No current facility-administered medications for this visit.    OBJECTIVE: Vitals:   01/29/22 0924  BP: 113/70  Pulse: 86  Resp: 18  Temp: (!) 97.4 F (36.3 C)      There is no height or weight on file to calculate BMI.    ECOG FS:0 - Asymptomatic  General: Well-developed, well-nourished, no acute distress. Eyes: Pink conjunctiva, anicteric sclera. HEENT: Normocephalic, moist mucous membranes. Breast: Exam deferred today. Lungs: No audible wheezing or coughing. Heart: Regular rate and rhythm. Abdomen: Soft, nontender,  no obvious distention. Musculoskeletal: No edema, cyanosis, or clubbing. Neuro: Alert, answering all questions appropriately. Cranial nerves grossly intact. Skin: No rashes or petechiae noted. Psych: Normal affect.    LAB RESULTS:  Lab Results  Component Value Date   NA 142 01/16/2021   K 4.2 01/16/2021   CL 108 01/16/2021   CO2 27 01/16/2021   GLUCOSE 76 01/16/2021   BUN 16 01/16/2021   CREATININE 0.71 01/16/2021   CALCIUM 8.5 01/16/2021   PROT 5.7 (L) 01/16/2021   ALBUMIN 3.7 01/16/2021   AST 14 01/16/2021   ALT 7 01/16/2021   ALKPHOS 50 01/16/2021   BILITOT 0.4 01/16/2021   GFRNONAA >60 06/20/2020   GFRAA 121 06/10/2016    Lab Results  Component Value Date   WBC 6.3 01/27/2022   NEUTROABS 3.1 06/20/2020   HGB 12.5 01/27/2022   HCT 38.4 01/27/2022   MCV 89.7 01/27/2022   PLT 247 01/27/2022     STUDIES: No results found.  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  Patient underwent lumpectomy on Nov 12, 2021.  Given the  small amount of invasive component, patient does not require Oncotype testing or adjuvant chemotherapy.  Patient completed XRT earlier today.  She was placed on letrozole given the focus of invasive carcinoma as well as an interaction of tamoxifen and Zoloft.  No further intervention is needed at this time.  Return to clinic in 3 months with video assisted telemedicine visit at which point patient can likely be transition to evaluation every 6 months.  Genetics: Patient was tested in January 2022 and this was reported as negative.   Bone health: Patient will require a baseline bone mineral density in the near future.    Patient expressed understanding and was in agreement with this plan. She also understands that She can call clinic at any time with any questions, concerns, or complaints.    Cancer Staging  Ductal carcinoma in situ (DCIS) of left breast Staging form: Breast, AJCC 8th Edition - Clinical: Stage IA (cT12m, cN0, cM0, G1, ER+, PR+, HER2-) - Signed by FLloyd Huger MD on 11/28/2021 Histologic grading system: 3 grade system   TLloyd Huger MD   01/30/2022 2:55 PM

## 2022-02-06 ENCOUNTER — Other Ambulatory Visit: Payer: Self-pay

## 2022-02-16 ENCOUNTER — Ambulatory Visit: Payer: 59

## 2022-02-24 ENCOUNTER — Other Ambulatory Visit: Payer: Self-pay

## 2022-02-24 DIAGNOSIS — M313 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: Secondary | ICD-10-CM | POA: Diagnosis not present

## 2022-02-24 DIAGNOSIS — Z7960 Long term (current) use of unspecified immunomodulators and immunosuppressants: Secondary | ICD-10-CM | POA: Diagnosis not present

## 2022-02-24 DIAGNOSIS — G2581 Restless legs syndrome: Secondary | ICD-10-CM | POA: Diagnosis not present

## 2022-02-24 MED ORDER — METHOTREXATE 2.5 MG PO TABS
ORAL_TABLET | ORAL | 1 refills | Status: DC
  Filled 2022-02-24: qty 32, 28d supply, fill #0
  Filled 2022-03-13: qty 32, 28d supply, fill #1

## 2022-02-24 MED ORDER — HYDROXYCHLOROQUINE SULFATE 200 MG PO TABS
200.0000 mg | ORAL_TABLET | Freq: Two times a day (BID) | ORAL | 5 refills | Status: DC
  Filled 2022-02-24: qty 60, 30d supply, fill #0

## 2022-02-24 MED ORDER — GABAPENTIN 300 MG PO CAPS
ORAL_CAPSULE | ORAL | 5 refills | Status: DC
Start: 2022-02-24 — End: 2022-04-09
  Filled 2022-02-24 – 2022-03-25 (×3): qty 30, 30d supply, fill #0

## 2022-02-25 ENCOUNTER — Other Ambulatory Visit: Payer: Self-pay

## 2022-02-25 MED ORDER — AMOXICILLIN-POT CLAVULANATE 875-125 MG PO TABS
1.0000 | ORAL_TABLET | Freq: Two times a day (BID) | ORAL | 1 refills | Status: DC
  Filled 2022-02-25: qty 30, 15d supply, fill #0
  Filled 2022-03-06: qty 30, 15d supply, fill #1

## 2022-02-25 MED ORDER — SULFAMETHOXAZOLE-TRIMETHOPRIM 800-160 MG PO TABS
ORAL_TABLET | ORAL | 1 refills | Status: DC
  Filled 2022-02-25: qty 30, 15d supply, fill #0

## 2022-03-02 ENCOUNTER — Other Ambulatory Visit: Payer: Self-pay | Admitting: General Surgery

## 2022-03-02 ENCOUNTER — Ambulatory Visit: Payer: 59 | Attending: General Surgery

## 2022-03-02 VITALS — Wt 178.0 lb

## 2022-03-02 DIAGNOSIS — Z483 Aftercare following surgery for neoplasm: Secondary | ICD-10-CM | POA: Insufficient documentation

## 2022-03-02 DIAGNOSIS — Z853 Personal history of malignant neoplasm of breast: Secondary | ICD-10-CM

## 2022-03-02 DIAGNOSIS — N61 Mastitis without abscess: Secondary | ICD-10-CM

## 2022-03-02 NOTE — Therapy (Signed)
OUTPATIENT PHYSICAL THERAPY SOZO SCREENING NOTE   Patient Name: [name] MRN: [id number] DOB:22-Oct-1970, 51 y.o., female Today's Date: 03/02/2022  PCP: Mckinley Jewel, MD REFERRING PROVIDER: Stark Klein, MD   PT End of Session - 03/02/22 [phone]     Visit Number 12   # unchanged due to screen only   PT Start Time 0904    PT Stop Time 0908    PT Time Calculation (min) 4 min    Activity Tolerance Patient tolerated treatment well    Behavior During Therapy [facility] for tasks assessed/performed             Past Medical History:  Diagnosis Date   Asthma    followed by pcp---  pt stated mild seasonal asthma   Cancer ([place]) 10/2021   left breast DCIS   Female cystocele    symptomatic   GAD (generalized anxiety disorder)    GERD (gastroesophageal reflux disease)    Hiatal hernia    History of 2019 novel coronavirus disease (COVID-19) 07/2020   per pt had mild symptoms that resolved   MDD (major depressive disorder)    Mixed incontinence urge and stress    Wears glasses    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[place])    [facility] and [facility]---  dx 2015,  takes methotrexate wkly, pt stated she is stable   Past Surgical History:  Procedure Laterality Date   ANTERIOR AND POSTERIOR REPAIR N/A 01/14/2021   Procedure: ANTERIOR (CYSTOCELE) AND POSTERIOR REPAIR (RECTOCELE);  Surgeon: Cheri Fowler, MD;  Location: [facility];  Service: Gynecology;  Laterality: N/A;   AUGMENTATION MAMMAPLASTY Bilateral 2010   saline   BREAST BIOPSY Left 10/23/2021   Stereo Bx, X-clip, path pending   BREAST ENHANCEMENT SURGERY  2009   BREAST LUMPECTOMY WITH RADIOACTIVE SEED AND SENTINEL LYMPH NODE BIOPSY Left 11/12/2021   Procedure: LEFT BREAST LUMPECTOMY WITH RADIOACTIVE SEED AND SENTINEL LYMPH NODE BIOPSY;  Surgeon: Stark Klein, MD;  Location: [place];  Service: General;  Laterality: Left;   CARPAL TUNNEL RELEASE Right 2011    LAPAROSCOPIC SUPRACERVICAL HYSTERECTOMY  02/21/2010   @ [place] by Dr Willis Modena   NASAL SEPTUM SURGERY  01/2018   insertion button prosthesis for septal perforation   RHINOPLASTY  2005   nasal fracture   Patient Active Problem List   Diagnosis Date Noted   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  09/13/2020   Cervix prolapsed into vagina 09/13/2020   Dysuria 09/13/2020   Genetic testing 09/10/2020   Anxiety 06/18/2020   Mild intermittent asthma without complication 07/86/7544   Bloody diarrhea 06/09/2016   Major depressive disorder, recurrent, severe without psychotic features ([place])    Severe recurrent major depression w/psychotic features, mood-congruent ([place]) 05/04/2014   Major depression 05/04/2014   Positive ANA (antinuclear antibody) 04/10/2014   Granulomatosis with polyangiitis with pulmonary involvement ([place]) 02/07/2014   Generalized anxiety disorder 02/07/2014   Insomnia 02/07/2014   Sensorineural hearing loss of right ear 12/06/2013   Dermatitis 12/05/2013   Nasal septal perforation 12/05/2013   Allergic rhinitis 10/12/2006   Asthma 10/12/2006   GERD 10/12/2006    REFERRING DIAG: left breast cancer at risk for lymphedema  THERAPY DIAG: Aftercare following surgery for neoplasm  PERTINENT HISTORY: Patient was diagnosed on 10/23/21 with left DCIS (grade to be determined later). It measures 1.46m  and is located in the upper outer quadrant. It is ER/PR +, HER2 -. Underwent a L lumpectomy and SLNB (0/3) on 11/12/21, Radiation to be completed 01/29/2022   PRECAUTIONS: left UE Lymphedema risk, None  SUBJECTIVE: Pt returns for her 3 month L-Dex screen.   PAIN:  Are you having pain? No  SOZO SCREENING: Patient was assessed today using the SOZO machine to determine the lymphedema index score. This was compared to her baseline score. It was determined that  she is within the recommended range when compared to her baseline and no further action is needed at this time. She will continue SOZO screenings. These are done every 3 months for 2 years post operatively followed by every 6 months for 2 years, and then annually.   L-DEX FLOWSHEETS - 03/02/22 0900       L-DEX LYMPHEDEMA SCREENING   Measurement Type Unilateral    L-DEX MEASUREMENT EXTREMITY Upper Extremity    POSITION  Standing    DOMINANT SIDE Right    At Risk Side Left    BASELINE SCORE (UNILATERAL) 5.1    L-DEX SCORE (UNILATERAL) 3.4    VALUE CHANGE (UNILAT) -1.7              Otelia Limes, PTA 03/02/2022, 9:08 AM

## 2022-03-03 DIAGNOSIS — E669 Obesity, unspecified: Secondary | ICD-10-CM | POA: Diagnosis not present

## 2022-03-03 DIAGNOSIS — K219 Gastro-esophageal reflux disease without esophagitis: Secondary | ICD-10-CM | POA: Diagnosis not present

## 2022-03-03 DIAGNOSIS — D0512 Intraductal carcinoma in situ of left breast: Secondary | ICD-10-CM | POA: Diagnosis not present

## 2022-03-03 DIAGNOSIS — F419 Anxiety disorder, unspecified: Secondary | ICD-10-CM | POA: Diagnosis not present

## 2022-03-03 DIAGNOSIS — J4520 Mild intermittent asthma, uncomplicated: Secondary | ICD-10-CM | POA: Diagnosis not present

## 2022-03-03 DIAGNOSIS — M313 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: Secondary | ICD-10-CM | POA: Diagnosis not present

## 2022-03-03 DIAGNOSIS — E559 Vitamin D deficiency, unspecified: Secondary | ICD-10-CM | POA: Diagnosis not present

## 2022-03-03 DIAGNOSIS — F32A Depression, unspecified: Secondary | ICD-10-CM | POA: Diagnosis not present

## 2022-03-03 DIAGNOSIS — Z0000 Encounter for general adult medical examination without abnormal findings: Secondary | ICD-10-CM | POA: Diagnosis not present

## 2022-03-05 ENCOUNTER — Ambulatory Visit
Admission: RE | Admit: 2022-03-05 | Discharge: 2022-03-05 | Disposition: A | Discharge status: Home / Self Care | Payer: 59 | Source: Ambulatory Visit | Attending: Radiation Oncology | Admitting: Radiation Oncology

## 2022-03-05 ENCOUNTER — Other Ambulatory Visit: Payer: Self-pay

## 2022-03-06 ENCOUNTER — Other Ambulatory Visit
Admission: RE | Admit: 2022-03-06 | Discharge: 2022-03-06 | Disposition: A | Discharge status: Home / Self Care | Payer: 59 | Attending: Internal Medicine | Admitting: Internal Medicine

## 2022-03-06 ENCOUNTER — Other Ambulatory Visit: Payer: Self-pay

## 2022-03-06 DIAGNOSIS — E7800 Pure hypercholesterolemia, unspecified: Secondary | ICD-10-CM | POA: Diagnosis not present

## 2022-03-06 DIAGNOSIS — E559 Vitamin D deficiency, unspecified: Secondary | ICD-10-CM | POA: Insufficient documentation

## 2022-03-06 DIAGNOSIS — M313 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: Secondary | ICD-10-CM | POA: Insufficient documentation

## 2022-03-06 DIAGNOSIS — N61 Mastitis without abscess: Secondary | ICD-10-CM | POA: Insufficient documentation

## 2022-03-06 LAB — COMPREHENSIVE METABOLIC PANEL
ALT: 15 U/L (ref 0–44)
AST: 25 U/L (ref 15–41)
Albumin: 3.9 g/dL (ref 3.5–5.0)
Alkaline Phosphatase: 75 U/L (ref 38–126)
Anion gap: 4 — ABNORMAL LOW (ref 5–15)
BUN: 17 mg/dL (ref 6–20)
CO2: 26 mmol/L (ref 22–32)
Calcium: 9.5 mg/dL (ref 8.9–10.3)
Chloride: 109 mmol/L (ref 98–111)
Creatinine, Ser: 0.7 mg/dL (ref 0.44–1.00)
GFR, Estimated: >60 mL/min (ref >60)
Glucose, Bld: 110 mg/dL — ABNORMAL HIGH (ref 70–99)
Potassium: 4 mmol/L (ref 3.5–5.1)
Sodium: 139 mmol/L (ref 135–145)
Total Bilirubin: 0.6 mg/dL (ref 0.3–1.2)
Total Protein: 7.2 g/dL (ref 6.5–8.1)

## 2022-03-06 LAB — CBC WITH DIFFERENTIAL/PLATELET
Abs Immature Granulocytes: 0.01 10*3/uL (ref 0.00–0.07)
Basophils Absolute: 0 10*3/uL (ref 0.0–0.1)
Basophils Relative: 1 %
Eosinophils Absolute: 0.3 10*3/uL (ref 0.0–0.5)
Eosinophils Relative: 7 %
HCT: 37.8 % (ref 36.0–46.0)
Hemoglobin: 12.2 g/dL (ref 12.0–15.0)
Immature Granulocytes: 0 %
Lymphocytes Relative: 21 %
Lymphs Abs: 0.9 10*3/uL (ref 0.7–4.0)
MCH: 28.1 pg (ref 26.0–34.0)
MCHC: 32.3 g/dL (ref 30.0–36.0)
MCV: 87.1 fL (ref 80.0–100.0)
Monocytes Absolute: 0.4 10*3/uL (ref 0.1–1.0)
Monocytes Relative: 9 %
Neutro Abs: 2.7 10*3/uL (ref 1.7–7.7)
Neutrophils Relative %: 62 %
Platelets: 258 10*3/uL (ref 150–400)
RBC: 4.34 MIL/uL (ref 3.87–5.11)
RDW: 14.6 % (ref 11.5–15.5)
WBC: 4.4 10*3/uL (ref 4.0–10.5)
nRBC: 0 % (ref 0.0–0.2)

## 2022-03-06 LAB — LIPID PANEL
Cholesterol: 236 mg/dL — ABNORMAL HIGH (ref 0–200)
HDL: 61 mg/dL (ref >40)
LDL Cholesterol: 148 mg/dL — ABNORMAL HIGH (ref 0–99)
Total CHOL/HDL Ratio: 3.9 RATIO
Triglycerides: 137 mg/dL (ref <150)
VLDL: 27 mg/dL (ref 0–40)

## 2022-03-06 LAB — VITAMIN D 25 HYDROXY (VIT D DEFICIENCY, FRACTURES): Vit D, 25-Hydroxy: 38 ng/mL (ref 30–100)

## 2022-03-10 ENCOUNTER — Other Ambulatory Visit: Payer: Self-pay

## 2022-03-12 ENCOUNTER — Other Ambulatory Visit: Payer: Self-pay | Admitting: General Surgery

## 2022-03-12 ENCOUNTER — Ambulatory Visit
Admission: RE | Admit: 2022-03-12 | Discharge: 2022-03-12 | Disposition: A | Discharge status: Home / Self Care | Payer: 59 | Source: Ambulatory Visit | Attending: General Surgery | Admitting: General Surgery

## 2022-03-12 DIAGNOSIS — R921 Mammographic calcification found on diagnostic imaging of breast: Secondary | ICD-10-CM | POA: Diagnosis not present

## 2022-03-12 DIAGNOSIS — Z853 Personal history of malignant neoplasm of breast: Secondary | ICD-10-CM

## 2022-03-12 DIAGNOSIS — N61 Mastitis without abscess: Secondary | ICD-10-CM

## 2022-03-12 DIAGNOSIS — N6489 Other specified disorders of breast: Secondary | ICD-10-CM | POA: Diagnosis not present

## 2022-03-12 DIAGNOSIS — L7682 Other postprocedural complications of skin and subcutaneous tissue: Secondary | ICD-10-CM | POA: Diagnosis not present

## 2022-03-13 ENCOUNTER — Other Ambulatory Visit: Payer: Self-pay

## 2022-03-13 MED ORDER — MONTELUKAST SODIUM 10 MG PO TABS
10.0000 mg | ORAL_TABLET | Freq: Every day | ORAL | 6 refills | Status: DC
  Filled 2022-03-13 – 2022-04-10 (×3): qty 30, 30d supply, fill #0
  Filled 2022-05-03: qty 30, 30d supply, fill #1
  Filled 2022-06-02: qty 30, 30d supply, fill #2
  Filled 2022-07-03: qty 30, 30d supply, fill #3
  Filled 2022-08-15: qty 30, 30d supply, fill #4
  Filled 2022-09-22: qty 30, 30d supply, fill #5
  Filled 2022-11-08 – 2022-11-09 (×2): qty 30, 30d supply, fill #6

## 2022-03-15 LAB — AEROBIC CULTURE W GRAM STAIN (SUPERFICIAL SPECIMEN)
Culture: NO GROWTH
Gram Stain: NONE SEEN

## 2022-03-17 ENCOUNTER — Other Ambulatory Visit: Payer: Self-pay | Admitting: General Surgery

## 2022-03-17 DIAGNOSIS — Z170 Estrogen receptor positive status [ER+]: Secondary | ICD-10-CM | POA: Diagnosis not present

## 2022-03-17 DIAGNOSIS — C50412 Malignant neoplasm of upper-outer quadrant of left female breast: Secondary | ICD-10-CM | POA: Diagnosis not present

## 2022-03-18 ENCOUNTER — Other Ambulatory Visit: Payer: Self-pay

## 2022-03-18 ENCOUNTER — Encounter (HOSPITAL_BASED_OUTPATIENT_CLINIC_OR_DEPARTMENT_OTHER): Payer: Self-pay | Admitting: General Surgery

## 2022-03-23 MED ORDER — ENSURE PRE-SURGERY PO LIQD: 296.0000 mL | Freq: Once | ORAL | Status: DC

## 2022-03-23 NOTE — Progress Notes (Signed)

## 2022-03-24 ENCOUNTER — Other Ambulatory Visit: Payer: Self-pay

## 2022-03-24 NOTE — H&P (Signed)
PROVIDER: Georgianne Fick, MD Patient Care Team: Claudean Severance, MD as PCP - General (Internal Medicine) Kevan Rosebush, MD as Consulting Provider (Dermatology) Pahwani, Rosann Auerbach, MD (Family Medicine) Meisinger, Graylon Good, MD (Obstetrics and Gynecology) Georgianne Fick, MD as Consulting Provider (Surgical Oncology) Denyce Robert, MD (Hematology and Oncology) Chrystal, Roda Shutters, MD (Radiation Oncology) Shirlyn Goltz, MD as Consulting Provider (Rheumatology)  MRN: [id number] DOB: 1971/04/11 DATE OF ENCOUNTER: 03/17/2022  Initial history: Patient presented with a new diagnosis of left breast cancer April 2023 in [place]. She had screening detected left breast calcifications. Apparently this was in 2020 initially. She subsequently underwent diagnostic mammogram with implant displacement on 10/06/2021. This showed a 1.3 cm group of calcifications in the left upper outer breast. She was noted to have multiple bilateral benign cysts. Core needle biopsy was performed and this demonstrated high-grade ductal carcinoma in situ with comedonecrosis and a small focus of invasive mammary carcinoma. ER was positive with strong intensity, PR was positive with moderate intensity and HER2 was equivocal.  She had not personally had a diagnosis of cancer before this 1. She does have a positive family history for pancreatic cancer in her mother and maternal aunt. She found out that her biological father had a sister who was diagnosed with breast cancer at age 4. Because of this she had genetic testing last year which was negative.  She also has Wegener's granulomatosis. This is taken care of by [facility] rheumatology (Dr. Ephriam Jenkins).   She works in the lab at [employer].   She underwent left breast seed localized lumpectomy and sentinel node biopsy 11/12/2021. Margins were negative. No additional invasive cancer was seen, just DCIS. I did two courses of doxycycline on her due to  redness and swelling. U/s at the [facility] office 5/15 did not show any significant seroma.   She returned 12/2021 with a "raw" sensation in her axilla running down her inner arm past the elbow into the forearm. She also had some finger numbness of the thumb, index and middle finger on the left hand.  Interval History:   Since June 2023, she developed a nonhealing wound in the left breast. She had been on 3 rounds of antibiotics. She continues to have a medial aspect of the skin above her incision where there continues to be drainage. This is clear once she has been on antibiotics, but it is murky within 1-2 days of stopping antibiotics. The pain has improved.   Breast US showed phlegmon under incision.   Physical Examination:   Redness is much better, but skin is still warm. This is the lateral incision and around 2 cm above. There is a 2 mm open area with some crusting in that location. Pressure on this tissue causes some welling up of the fluid.   Assessment and Plan:   NERA HAWORTH is a 51 y.o. female who underwent left breast conservation 11/12/2021  Diagnoses and all orders for this visit:  Malignant neoplasm of upper-outer quadrant of left breast in female, estrogen receptor positive (CMS-HCC)  Chronic infection  Patient completed radiation and started tamoxifen. Mammogram will be due 10/2022 [facility]  Given lack of deeper fluid collection (just phlegmon) and lack of improvement on long term bactrim, will excise the scar and the superior skin with underlying tissue. This has been where the problem has been. I will also use monofilament suture for wound closure to minimize reaction to the braided suture. I will wash this out well and  send deeper cultures. She is to stay on antibiotics until surgery.   The plan was discussed in detail with the patient today, who expressed understanding. The patient has my contact information, and understands to call me with any additional  questions or concerns in the interval. I would be happy to see the patient back sooner if the need arises.

## 2022-03-25 ENCOUNTER — Other Ambulatory Visit: Payer: Self-pay

## 2022-03-25 ENCOUNTER — Encounter (HOSPITAL_BASED_OUTPATIENT_CLINIC_OR_DEPARTMENT_OTHER): Payer: Self-pay | Admitting: General Surgery

## 2022-03-25 ENCOUNTER — Other Ambulatory Visit (HOSPITAL_COMMUNITY): Payer: Self-pay

## 2022-03-25 ENCOUNTER — Ambulatory Visit (HOSPITAL_BASED_OUTPATIENT_CLINIC_OR_DEPARTMENT_OTHER)
Admission: RE | Admit: 2022-03-25 | Discharge: 2022-03-25 | Disposition: A | Discharge status: Home / Self Care | Payer: 59 | Attending: General Surgery | Admitting: General Surgery

## 2022-03-25 ENCOUNTER — Encounter (HOSPITAL_BASED_OUTPATIENT_CLINIC_OR_DEPARTMENT_OTHER)
Admission: RE | Disposition: A | Discharge status: Home / Self Care | Payer: Self-pay | Source: Home / Self Care | Attending: General Surgery

## 2022-03-25 ENCOUNTER — Ambulatory Visit (HOSPITAL_BASED_OUTPATIENT_CLINIC_OR_DEPARTMENT_OTHER): Payer: 59 | Admitting: Anesthesiology

## 2022-03-25 DIAGNOSIS — Z923 Personal history of irradiation: Secondary | ICD-10-CM | POA: Insufficient documentation

## 2022-03-25 DIAGNOSIS — Z79631 Long term (current) use of antimetabolite agent: Secondary | ICD-10-CM | POA: Diagnosis not present

## 2022-03-25 DIAGNOSIS — Z170 Estrogen receptor positive status [ER+]: Secondary | ICD-10-CM | POA: Diagnosis not present

## 2022-03-25 DIAGNOSIS — N319 Neuromuscular dysfunction of bladder, unspecified: Secondary | ICD-10-CM | POA: Insufficient documentation

## 2022-03-25 DIAGNOSIS — N6002 Solitary cyst of left breast: Secondary | ICD-10-CM | POA: Diagnosis not present

## 2022-03-25 DIAGNOSIS — G709 Myoneural disorder, unspecified: Secondary | ICD-10-CM | POA: Diagnosis not present

## 2022-03-25 DIAGNOSIS — T8149XA Infection following a procedure, other surgical site, initial encounter: Secondary | ICD-10-CM | POA: Diagnosis not present

## 2022-03-25 DIAGNOSIS — M313 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: Secondary | ICD-10-CM | POA: Insufficient documentation

## 2022-03-25 DIAGNOSIS — J45909 Unspecified asthma, uncomplicated: Secondary | ICD-10-CM | POA: Insufficient documentation

## 2022-03-25 DIAGNOSIS — N611 Abscess of the breast and nipple: Secondary | ICD-10-CM | POA: Diagnosis not present

## 2022-03-25 DIAGNOSIS — Y838 Other surgical procedures as the cause of abnormal reaction of the patient, or of later complication, without mention of misadventure at the time of the procedure: Secondary | ICD-10-CM | POA: Diagnosis not present

## 2022-03-25 DIAGNOSIS — Z803 Family history of malignant neoplasm of breast: Secondary | ICD-10-CM | POA: Diagnosis not present

## 2022-03-25 DIAGNOSIS — M96843 Postprocedural seroma of a musculoskeletal structure following other procedure: Secondary | ICD-10-CM | POA: Insufficient documentation

## 2022-03-25 DIAGNOSIS — S21009A Unspecified open wound of unspecified breast, initial encounter: Secondary | ICD-10-CM | POA: Diagnosis not present

## 2022-03-25 DIAGNOSIS — S21002A Unspecified open wound of left breast, initial encounter: Secondary | ICD-10-CM | POA: Diagnosis not present

## 2022-03-25 DIAGNOSIS — Z01818 Encounter for other preprocedural examination: Secondary | ICD-10-CM

## 2022-03-25 DIAGNOSIS — T8189XA Other complications of procedures, not elsewhere classified, initial encounter: Secondary | ICD-10-CM | POA: Diagnosis present

## 2022-03-25 DIAGNOSIS — C50412 Malignant neoplasm of upper-outer quadrant of left female breast: Secondary | ICD-10-CM | POA: Diagnosis not present

## 2022-03-25 DIAGNOSIS — N641 Fat necrosis of breast: Secondary | ICD-10-CM | POA: Diagnosis not present

## 2022-03-25 DIAGNOSIS — N6032 Fibrosclerosis of left breast: Secondary | ICD-10-CM | POA: Diagnosis not present

## 2022-03-25 DIAGNOSIS — L7682 Other postprocedural complications of skin and subcutaneous tissue: Secondary | ICD-10-CM | POA: Diagnosis not present

## 2022-03-25 HISTORY — PX: BREAST CYST EXCISION: SHX579

## 2022-03-25 SURGERY — EXCISION, CYST, BREAST
Anesthesia: General | Site: Breast | Laterality: Left

## 2022-03-25 MED ORDER — FENTANYL CITRATE (PF) 100 MCG/2ML IJ SOLN
INTRAMUSCULAR | Status: AC
  Filled 2022-03-25: qty 2

## 2022-03-25 MED ORDER — MIDAZOLAM HCL 5 MG/5ML IJ SOLN
INTRAMUSCULAR | Status: DC | PRN
  Administered 2022-03-25: 2 mg via INTRAVENOUS

## 2022-03-25 MED ORDER — ONDANSETRON HCL 4 MG/2ML IJ SOLN
INTRAMUSCULAR | Status: DC | PRN
  Administered 2022-03-25: 4 mg via INTRAVENOUS

## 2022-03-25 MED ORDER — EPHEDRINE 5 MG/ML INJ
INTRAVENOUS | Status: AC
  Filled 2022-03-25: qty 5

## 2022-03-25 MED ORDER — DEXAMETHASONE SODIUM PHOSPHATE 4 MG/ML IJ SOLN
INTRAMUSCULAR | Status: DC | PRN
  Administered 2022-03-25: 5 mg via INTRAVENOUS

## 2022-03-25 MED ORDER — ACETAMINOPHEN 500 MG PO TABS
ORAL_TABLET | ORAL | Status: AC
  Filled 2022-03-25: qty 2

## 2022-03-25 MED ORDER — LACTATED RINGERS IV SOLN: INTRAVENOUS | Status: DC

## 2022-03-25 MED ORDER — FENTANYL CITRATE (PF) 100 MCG/2ML IJ SOLN
25.0000 ug | INTRAMUSCULAR | Status: DC | PRN
  Administered 2022-03-25 (×4): 25 ug via INTRAVENOUS

## 2022-03-25 MED ORDER — ONDANSETRON HCL 4 MG/2ML IJ SOLN
INTRAMUSCULAR | Status: AC
  Filled 2022-03-25: qty 2

## 2022-03-25 MED ORDER — MIDAZOLAM HCL 2 MG/2ML IJ SOLN
INTRAMUSCULAR | Status: AC
  Filled 2022-03-25: qty 2

## 2022-03-25 MED ORDER — HYDROCODONE-ACETAMINOPHEN 5-325 MG PO TABS
ORAL_TABLET | ORAL | Status: AC
  Filled 2022-03-25: qty 1

## 2022-03-25 MED ORDER — SUCCINYLCHOLINE CHLORIDE 200 MG/10ML IV SOSY
PREFILLED_SYRINGE | INTRAVENOUS | Status: AC
  Filled 2022-03-25: qty 10

## 2022-03-25 MED ORDER — SODIUM CHLORIDE 0.9 % IV SOLN
INTRAVENOUS | Status: DC | PRN
  Administered 2022-03-25: 500 mL

## 2022-03-25 MED ORDER — LIDOCAINE-EPINEPHRINE (PF) 1 %-1:200000 IJ SOLN
INTRAMUSCULAR | Status: DC | PRN
  Administered 2022-03-25: 10 mL

## 2022-03-25 MED ORDER — CHLORHEXIDINE GLUCONATE CLOTH 2 % EX PADS: 6.0000 | MEDICATED_PAD | Freq: Once | CUTANEOUS | Status: DC

## 2022-03-25 MED ORDER — HYDROCODONE-ACETAMINOPHEN 5-325 MG PO TABS
1.0000 | ORAL_TABLET | Freq: Once | ORAL | Status: AC | PRN
  Administered 2022-03-25: 1 via ORAL

## 2022-03-25 MED ORDER — HYDROCODONE-ACETAMINOPHEN 5-325 MG PO TABS: 1.0000 | ORAL_TABLET | ORAL | 0 refills | Status: DC | PRN

## 2022-03-25 MED ORDER — IBUPROFEN 800 MG PO TABS
800.0000 mg | ORAL_TABLET | Freq: Three times a day (TID) | ORAL | 1 refills | Status: DC | PRN
  Filled 2022-03-25: qty 30, 10d supply, fill #0

## 2022-03-25 MED ORDER — PROPOFOL 500 MG/50ML IV EMUL
INTRAVENOUS | Status: DC | PRN
  Administered 2022-03-25: 50 ug/kg/min via INTRAVENOUS

## 2022-03-25 MED ORDER — KETOROLAC TROMETHAMINE 30 MG/ML IJ SOLN
30.0000 mg | Freq: Once | INTRAMUSCULAR | Status: AC
  Administered 2022-03-25: 30 mg via INTRAVENOUS

## 2022-03-25 MED ORDER — AMISULPRIDE (ANTIEMETIC) 5 MG/2ML IV SOLN: 10.0000 mg | Freq: Once | INTRAVENOUS | Status: DC | PRN

## 2022-03-25 MED ORDER — HYDROCODONE-ACETAMINOPHEN 7.5-325 MG PO TABS: 1.0000 | ORAL_TABLET | Freq: Once | ORAL | Status: DC | PRN

## 2022-03-25 MED ORDER — ACETAMINOPHEN 500 MG PO TABS
1000.0000 mg | ORAL_TABLET | ORAL | Status: AC
  Administered 2022-03-25: 1000 mg via ORAL

## 2022-03-25 MED ORDER — LIDOCAINE HCL (CARDIAC) PF 100 MG/5ML IV SOSY
PREFILLED_SYRINGE | INTRAVENOUS | Status: DC | PRN
  Administered 2022-03-25: 50 mg via INTRAVENOUS

## 2022-03-25 MED ORDER — OXYCODONE HCL 5 MG PO TABS: 5.0000 mg | ORAL_TABLET | Freq: Once | ORAL | Status: DC | PRN

## 2022-03-25 MED ORDER — KETOROLAC TROMETHAMINE 30 MG/ML IJ SOLN
INTRAMUSCULAR | Status: AC
  Filled 2022-03-25: qty 1

## 2022-03-25 MED ORDER — PHENYLEPHRINE 80 MCG/ML (10ML) SYRINGE FOR IV PUSH (FOR BLOOD PRESSURE SUPPORT)
PREFILLED_SYRINGE | INTRAVENOUS | Status: AC
  Filled 2022-03-25: qty 10

## 2022-03-25 MED ORDER — FENTANYL CITRATE (PF) 100 MCG/2ML IJ SOLN
INTRAMUSCULAR | Status: DC | PRN
  Administered 2022-03-25: 100 ug via INTRAVENOUS

## 2022-03-25 MED ORDER — PROPOFOL 10 MG/ML IV BOLUS
INTRAVENOUS | Status: DC | PRN
  Administered 2022-03-25: 200 mg via INTRAVENOUS
  Administered 2022-03-25 (×2): 100 mg via INTRAVENOUS

## 2022-03-25 MED ORDER — SUCCINYLCHOLINE CHLORIDE 200 MG/10ML IV SOSY
PREFILLED_SYRINGE | INTRAVENOUS | Status: DC | PRN
  Administered 2022-03-25: 100 mg via INTRAVENOUS

## 2022-03-25 MED ORDER — ONDANSETRON HCL 4 MG/2ML IJ SOLN: 4.0000 mg | Freq: Once | INTRAMUSCULAR | Status: DC | PRN

## 2022-03-25 MED ORDER — SODIUM CHLORIDE 0.9 % IV SOLN
INTRAVENOUS | Status: AC
  Filled 2022-03-25: qty 10

## 2022-03-25 MED ORDER — CEFAZOLIN SODIUM-DEXTROSE 2-4 GM/100ML-% IV SOLN
INTRAVENOUS | Status: AC
  Filled 2022-03-25: qty 100

## 2022-03-25 MED ORDER — OXYCODONE HCL 5 MG/5ML PO SOLN: 5.0000 mg | Freq: Once | ORAL | Status: DC | PRN

## 2022-03-25 MED ORDER — CEFAZOLIN SODIUM-DEXTROSE 2-4 GM/100ML-% IV SOLN
2.0000 g | INTRAVENOUS | Status: AC
  Administered 2022-03-25: 2 g via INTRAVENOUS

## 2022-03-25 SURGICAL SUPPLY — 56 items
ADH SKN CLS APL DERMABOND .7 (GAUZE/BANDAGES/DRESSINGS) ×1
APL PRP STRL LF DISP 70% ISPRP (MISCELLANEOUS) ×1
BINDER BREAST LRG (GAUZE/BANDAGES/DRESSINGS) IMPLANT
BINDER BREAST MEDIUM (GAUZE/BANDAGES/DRESSINGS) IMPLANT
BINDER BREAST XLRG (GAUZE/BANDAGES/DRESSINGS) IMPLANT
BINDER BREAST XXLRG (GAUZE/BANDAGES/DRESSINGS) IMPLANT
BLADE SURG 10 STRL SS (BLADE) ×1 IMPLANT
BLADE SURG 15 STRL LF DISP TIS (BLADE) ×1 IMPLANT
BLADE SURG 15 STRL SS (BLADE) ×1
CANISTER SUCT 1200ML W/VALVE (MISCELLANEOUS) ×1 IMPLANT
CHLORAPREP W/TINT 26 (MISCELLANEOUS) ×1 IMPLANT
CLIP TI LARGE 6 (CLIP) IMPLANT
COVER BACK TABLE 60X90IN (DRAPES) ×1 IMPLANT
COVER MAYO STAND STRL (DRAPES) ×1 IMPLANT
DERMABOND ADVANCED .7 DNX12 (GAUZE/BANDAGES/DRESSINGS) ×1 IMPLANT
DRAPE LAPAROTOMY 100X72 PEDS (DRAPES) ×1 IMPLANT
DRAPE UTILITY XL STRL (DRAPES) ×1 IMPLANT
ELECT REM PT RETURN 9FT ADLT (ELECTROSURGICAL) ×1
ELECTRODE REM PT RTRN 9FT ADLT (ELECTROSURGICAL) ×1 IMPLANT
GAUZE PAD ABD 8X10 STRL (GAUZE/BANDAGES/DRESSINGS) IMPLANT
GAUZE SPONGE 4X4 12PLY STRL LF (GAUZE/BANDAGES/DRESSINGS) ×1 IMPLANT
GLOVE BIO SURGEON STRL SZ 6 (GLOVE) ×1 IMPLANT
GLOVE BIOGEL PI IND STRL 6.5 (GLOVE) ×1 IMPLANT
GOWN STRL REUS W/ TWL LRG LVL3 (GOWN DISPOSABLE) ×1 IMPLANT
GOWN STRL REUS W/TWL 2XL LVL3 (GOWN DISPOSABLE) ×1 IMPLANT
GOWN STRL REUS W/TWL LRG LVL3 (GOWN DISPOSABLE) ×2
KIT MARKER MARGIN INK (KITS) IMPLANT
LIGHT WAVEGUIDE WIDE FLAT (MISCELLANEOUS) IMPLANT
NDL HYPO 25X1 1.5 SAFETY (NEEDLE) ×1 IMPLANT
NEEDLE HYPO 25X1 1.5 SAFETY (NEEDLE) ×1 IMPLANT
NS IRRIG 1000ML POUR BTL (IV SOLUTION) ×1 IMPLANT
PACK BASIN DAY SURGERY FS (CUSTOM PROCEDURE TRAY) ×1 IMPLANT
PENCIL SMOKE EVACUATOR (MISCELLANEOUS) ×1 IMPLANT
SLEEVE SCD COMPRESS KNEE MED (STOCKING) ×1 IMPLANT
SPIKE FLUID TRANSFER (MISCELLANEOUS) IMPLANT
SPONGE T-LAP 18X18 ~~LOC~~+RFID (SPONGE) ×1 IMPLANT
STAPLER VISISTAT 35W (STAPLE) IMPLANT
STRIP CLOSURE SKIN 1/2X4 (GAUZE/BANDAGES/DRESSINGS) ×1 IMPLANT
STRIP CLOSURE SKIN 1/4X4 (GAUZE/BANDAGES/DRESSINGS) IMPLANT
SUT MNCRL AB 3-0 PS2 18 (SUTURE) IMPLANT
SUT MON AB 4-0 PC3 18 (SUTURE) ×1 IMPLANT
SUT SILK 2 0 SH (SUTURE) IMPLANT
SUT VIC AB 2-0 SH 27 (SUTURE)
SUT VIC AB 2-0 SH 27XBRD (SUTURE) IMPLANT
SUT VIC AB 3-0 54X BRD REEL (SUTURE) IMPLANT
SUT VIC AB 3-0 BRD 54 (SUTURE)
SUT VIC AB 3-0 SH 27 (SUTURE) ×1
SUT VIC AB 3-0 SH 27X BRD (SUTURE) ×1 IMPLANT
SWAB COLLECTION DEVICE MRSA (MISCELLANEOUS) IMPLANT
SWAB CULTURE ESWAB REG 1ML (MISCELLANEOUS) IMPLANT
SYR BULB EAR ULCER 3OZ GRN STR (SYRINGE) ×1 IMPLANT
SYR CONTROL 10ML LL (SYRINGE) ×1 IMPLANT
TOWEL GREEN STERILE FF (TOWEL DISPOSABLE) ×1 IMPLANT
TRAY FAXITRON CT DISP (TRAY / TRAY PROCEDURE) IMPLANT
TUBE CONNECTING 20X1/4 (TUBING) ×1 IMPLANT
YANKAUER SUCT BULB TIP NO VENT (SUCTIONS) ×1 IMPLANT

## 2022-03-25 NOTE — Anesthesia Preprocedure Evaluation (Addendum)
Anesthesia Evaluation  Patient identified by MRN, date of birth, ID band Patient awake    Reviewed: Allergy & Precautions, NPO status , Patient's Chart, lab work & pertinent test results  History of Anesthesia Complications Negative for: history of anesthetic complications  Airway Mallampati: II  TM Distance: >3 FB Neck ROM: Full    Dental no notable dental hx. (+) Teeth Intact, Dental Advisory Given, Caps   Pulmonary asthma ,  Granulomatosis with pulmonary involvement   Pulmonary exam normal breath sounds clear to auscultation       Cardiovascular negative cardio ROS Normal cardiovascular exam Rhythm:Regular Rate:Normal     Neuro/Psych PSYCHIATRIC DISORDERS Anxiety Depression  Neuromuscular disease    GI/Hepatic Neg liver ROS, hiatal hernia, GERD  Medicated and Controlled,  Endo/Other  Ductal CIS left breast S/P lumpectomy Open wound left breast  Renal/GU Wegener's granulomatosis  Bladder dysfunction  Cystocele SUI    Musculoskeletal + ANA  Wegner's granulomatosis- takes methotrexate weekly, stable,  followed by Rheumatology   Abdominal   Peds  Hematology negative hematology ROS (+)   Anesthesia Other Findings   Reproductive/Obstetrics                            Anesthesia Physical  Anesthesia Plan  ASA: 2  Anesthesia Plan: General   Post-op Pain Management: Minimal or no pain anticipated, Tylenol PO (pre-op)* and Dilaudid IV   Induction: Intravenous  PONV Risk Score and Plan: 3 and Treatment may vary due to age or medical condition, Ondansetron, Dexamethasone and Midazolam  Airway Management Planned: LMA  Additional Equipment: None  Intra-op Plan:   Post-operative Plan: Extubation in OR  Informed Consent: I have reviewed the patients History and Physical, chart, labs and discussed the procedure including the risks, benefits and alternatives for the proposed anesthesia  with the patient or authorized representative who has indicated his/her understanding and acceptance.     Dental advisory given  Plan Discussed with: CRNA  Anesthesia Plan Comments:         Anesthesia Quick Evaluation

## 2022-03-25 NOTE — Transfer of Care (Signed)
Immediate Anesthesia Transfer of Care Note  Patient: ABRISH ERNY  Procedure(s) Performed: EXCISION OF CHRONIC LEFT BREAST WOUND (Left: Breast)  Patient Location: PACU  Anesthesia Type:General  Level of Consciousness: awake, alert , oriented, drowsy and patient cooperative  Airway & Oxygen Therapy: Patient Spontanous Breathing and Patient connected to face mask oxygen  Post-op Assessment: Report given to RN and Post -op Vital signs reviewed and stable  Post vital signs: Reviewed and stable  Last Vitals:  Vitals Value Taken Time  BP    Temp    Pulse    Resp    SpO2      Last Pain:  Vitals:   03/25/22 1317  TempSrc: Oral  PainSc: 0-No pain      Patients Stated Pain Goal: 8 (38/45/36 4680)  Complications: No notable events documented.

## 2022-03-25 NOTE — Discharge Instructions (Addendum)
May take ibuprofen at 28m, as needed, then as directed. Vicodin (Hydrocodone) 1 tablet taken at 6:45. May repeat when needed. Do not exceed 2 tablets in any 4 hour period.  [place] Phone Number [phone] BREAST BIOPSY/ PARTIAL MASTECTOMY: POST OP INSTRUCTIONS  Always review your discharge instruction sheet given to you by the facility where your surgery was performed.  IF YOU HAVE DISABILITY OR FAMILY LEAVE FORMS, YOU MUST BRING THEM TO THE OFFICE FOR PROCESSING.  DO NOT GIVE THEM TO YOUR DOCTOR.  Take 2 tylenol (acetominophen) three times a day for 3 days.  If you still have pain, add ibuprofen with food in between if able to take this (if you have kidney issues or stomach issues, do not take ibuprofen).  If both of those are not enough, add the narcotic pain pill.  If you find you are needing a lot of this overnight after surgery, call the next morning for a refill.    Prescriptions will not be filled after 5pm or on week-ends. Take your usually prescribed medications unless otherwise directed You should eat very light the first 24 hours after surgery, such as soup, crackers, pudding, etc.  Resume your normal diet the day after surgery. Most patients will experience some swelling and bruising in the breast.  Ice packs and a good support bra will help.  Swelling and bruising can take several days to resolve.  It is common to experience some constipation if taking pain medication after surgery.  Increasing fluid intake and taking a stool softener will usually help or prevent this problem from occurring.  A mild laxative (Milk of Magnesia or Miralax) should be taken according to package directions if there are no bowel movements after 48 hours. Unless discharge instructions indicate otherwise, you may remove your bandages 48 hours after surgery, and you may shower at that time.  You may have steri-strips (small skin tapes) in place directly over the incision.  These  strips should be left on the skin at least for for 7-10 days.    ACTIVITIES:  You may resume regular daily activities (gradually increasing) beginning the next day.  Wearing a good support bra or sports bra (or the breast binder) minimizes pain and swelling.  You may have sexual intercourse when it is comfortable. No heavy lifting for 1-2 weeks (not over around 10 pounds).  You may drive when you no longer are taking prescription pain medication, you can comfortably wear a seatbelt, and you can safely maneuver your car and apply brakes. RETURN TO WORK:  __________3-14 days depending on job. See work note. You should see your doctor in the office for a follow-up appointment approximately two weeks after your surgery.  Your doctor's nurse will typically make your follow-up appointment when she calls you with your pathology report.  Expect your pathology report 3-4 business days after your surgery.  You may call to check if you do not hear from [place] three days.   WHEN TO CALL YOUR DOCTOR: Fever over 101.0 Nausea and/or vomiting. Extreme swelling or bruising. Continued bleeding from incision. Increased pain, redness, or drainage from the incision.  The clinic staff is available to answer your questions during regular business hours.  Please don't hesitate to call and ask to speak to one of the nurses for clinical concerns.  If you have a medical emergency, go to the nearest emergency room or call 911.  A [facility] is always on call at  the hospital.  For further questions, please visit [email or web address]    Post Anesthesia Home Care Instructions  Activity: Get plenty of rest for the remainder of the day. A responsible individual must stay with you for 24 hours following the procedure.  For the next 24 hours, DO NOT: -Drive a car -Paediatric nurse -Drink alcoholic beverages -Take any medication unless instructed by your physician -Make any legal  decisions or sign important papers.  Meals: Start with liquid foods such as gelatin or soup. Progress to regular foods as tolerated. Avoid greasy, spicy, heavy foods. If nausea and/or vomiting occur, drink only clear liquids until the nausea and/or vomiting subsides. Call your physician if vomiting continues.  Special Instructions/Symptoms: Your throat may feel dry or sore from the anesthesia or the breathing tube placed in your throat during surgery. If this causes discomfort, gargle with warm salt water. The discomfort should disappear within 24 hours.  If you had a scopolamine patch placed behind your ear for the management of post- operative nausea and/or vomiting:  1. The medication in the patch is effective for 72 hours, after which it should be removed.  Wrap patch in a tissue and discard in the trash. Wash hands thoroughly with soap and water. 2. You may remove the patch earlier than 72 hours if you experience unpleasant side effects which may include dry mouth, dizziness or visual disturbances. 3. Avoid touching the patch. Wash your hands with soap and water after contact with the patch.

## 2022-03-25 NOTE — Interval H&P Note (Signed)
History and Physical Interval Note:  03/25/2022 1:55 PM  [name]  has presented today for surgery, with the diagnosis of CHRONIC LEFT BREAST WOUND.  The various methods of treatment have been discussed with the patient and family. After consideration of risks, benefits and other options for treatment, the patient has consented to  Procedure(s): EXCISION OF CHRONIC LEFT BREAST WOUND (Left) as a surgical intervention.  The patient's history has been reviewed, patient examined, no change in status, stable for surgery.  I have reviewed the patient's chart and labs.  Questions were answered to the patient's satisfaction.     Stark Klein

## 2022-03-25 NOTE — Op Note (Signed)
PRE-OPERATIVE DIAGNOSIS: chronic non healing breast wound  POST-OPERATIVE DIAGNOSIS:  Same  PROCEDURE:  Procedure(s): Excision of chronic breast wound  SURGEON:  Surgeon(s): Stark Klein, MD  ASSISTANT: Malachi Pro, PA-C  ANESTHESIA:   general  DRAINS: none   LOCAL MEDICATIONS USED:  BUPIVICAINE  and LIDOCAINE   SPECIMEN:  Source of Specimen:  chronic breast wound  DISPOSITION OF SPECIMEN:  PATHOLOGY  COUNTS:  YES  DICTATION: .Dragon Dictation  PLAN OF CARE: Discharge to home after PACU  PATIENT DISPOSITION:  PACU - hemodynamically stable.  FINDINGS: Chronic inflammatory changes and sinus tract from previous wound  EBL: min  PROCEDURE:   Patient was identified in the holding area and taken to the operating room where she was placed supine on the operating room table.  General anesthesia was induced after sequential compression devices were turned on.  Her left breast and upper chest were prepped and draped in sterile fashion.  A timeout was performed according to the surgical safety checklist.  When all was correct, we continued.    The sinus tract was seen in the upper outer quadrant as well as the thinned skin overlying her chronic seroma cavity.  This was excised with the previous incision.  The cautery was used to take this tissue down to the muscle in order to completely eradicate this chronic tract.  On the back table, this was opened and cultures were sent.  There was at least 1 clip in this sinus tract.  The cavity was irrigated with gentamicin irrigation.  Hemostasis was achieved with the cautery.  The deeper tissue was pulled together with a running 3-0 Monocryl.  The skin was freed up from the breast tissue and this was closed with interrupted Monocryl.  The central sutures were placed with mattress sutures.  The wound was then dressed with Steri-Strips gauze ABDs and a breast binder.  The patient was allowed to emerge from anesthesia and was taken to the PACU in  stable condition.  Needle, sponge, and instrument counts were correct x2.

## 2022-03-25 NOTE — Anesthesia Procedure Notes (Signed)
Procedure Name: LMA Insertion Date/Time: 03/25/2022 4:39 PM  Performed by: Willa Frater, CRNAPre-anesthesia Checklist: Patient identified, Emergency Drugs available, Suction available and Patient being monitored Patient Re-evaluated:Patient Re-evaluated prior to induction Oxygen Delivery Method: Circle system utilized Preoxygenation: Pre-oxygenation with 100% oxygen Induction Type: IV induction Ventilation: Mask ventilation without difficulty LMA: LMA inserted LMA Size: 4.0 Number of attempts: 1 Airway Equipment and Method: Bite block Placement Confirmation: positive ETCO2 Tube secured with: Tape Dental Injury: Teeth and Oropharynx as per pre-operative assessment

## 2022-03-26 ENCOUNTER — Other Ambulatory Visit (HOSPITAL_COMMUNITY): Payer: Self-pay

## 2022-03-26 ENCOUNTER — Encounter (HOSPITAL_BASED_OUTPATIENT_CLINIC_OR_DEPARTMENT_OTHER): Payer: Self-pay | Admitting: General Surgery

## 2022-03-26 ENCOUNTER — Other Ambulatory Visit: Payer: Self-pay

## 2022-03-26 MED ORDER — HYDROCODONE-ACETAMINOPHEN 5-325 MG PO TABS
1.0000 | ORAL_TABLET | ORAL | 0 refills | Status: DC | PRN
  Filled 2022-03-26: qty 10, 5d supply, fill #0

## 2022-03-27 LAB — SURGICAL PATHOLOGY

## 2022-03-27 NOTE — Anesthesia Postprocedure Evaluation (Signed)
Anesthesia Post Note  Patient: [name]  Procedure(s) Performed: EXCISION OF CHRONIC LEFT BREAST WOUND (Left: Breast)     Patient location during evaluation: PACU Anesthesia Type: General Level of consciousness: awake and alert Pain management: pain level controlled Vital Signs Assessment: post-procedure vital signs reviewed and stable Respiratory status: spontaneous breathing, nonlabored ventilation and respiratory function stable Cardiovascular status: blood pressure returned to baseline and stable Postop Assessment: no apparent nausea or vomiting Anesthetic complications: no   No notable events documented.  Last Vitals:  Vitals:   03/25/22 1827 03/25/22 1839  BP: 117/72 119/79  Pulse: 74 73  Resp: 11 16  Temp:  37 C  SpO2: 97%     Last Pain:  Vitals:   03/25/22 1845  TempSrc:   PainSc: 4                  Ulyssa Walthour

## 2022-03-29 LAB — AEROBIC/ANAEROBIC CULTURE W GRAM STAIN (SURGICAL/DEEP WOUND)

## 2022-03-30 ENCOUNTER — Other Ambulatory Visit: Payer: Self-pay

## 2022-04-02 ENCOUNTER — Other Ambulatory Visit (HOSPITAL_COMMUNITY): Payer: Self-pay

## 2022-04-02 ENCOUNTER — Other Ambulatory Visit: Payer: Self-pay

## 2022-04-02 MED ORDER — NYSTATIN 100000 UNIT/ML MT SUSP
5.0000 mL | Freq: Four times a day (QID) | OROMUCOSAL | 0 refills | Status: DC
  Filled 2022-04-02: qty 120, 6d supply, fill #0

## 2022-04-02 MED ORDER — NYSTATIN 100000 UNIT/ML MT SUSP
5.0000 mL | Freq: Four times a day (QID) | OROMUCOSAL | 0 refills | Status: DC | PRN
  Filled 2022-04-02: qty 120, 6d supply, fill #0

## 2022-04-02 MED ORDER — AMOXICILLIN-POT CLAVULANATE 875-125 MG PO TABS
1.0000 | ORAL_TABLET | Freq: Two times a day (BID) | ORAL | 0 refills | Status: DC
  Filled 2022-04-02: qty 14, 7d supply, fill #0

## 2022-04-03 ENCOUNTER — Encounter (HOSPITAL_COMMUNITY): Payer: Self-pay

## 2022-04-03 ENCOUNTER — Emergency Department
Admission: EM | Admit: 2022-04-03 | Discharge: 2022-04-03 | Disposition: A | Discharge status: Other Acute Inpatient Hospital | Payer: 59 | Attending: Emergency Medicine | Admitting: Emergency Medicine

## 2022-04-03 ENCOUNTER — Observation Stay: Admit: 2022-04-03 | Payer: 59

## 2022-04-03 ENCOUNTER — Encounter: Payer: Self-pay | Admitting: Emergency Medicine

## 2022-04-03 ENCOUNTER — Other Ambulatory Visit (HOSPITAL_COMMUNITY): Payer: Self-pay

## 2022-04-03 ENCOUNTER — Other Ambulatory Visit: Payer: Self-pay

## 2022-04-03 ENCOUNTER — Inpatient Hospital Stay (HOSPITAL_COMMUNITY)
Admission: EM | Admit: 2022-04-03 | Discharge: 2022-04-09 | DRG: 862 | Disposition: A | Discharge status: Home / Self Care | Payer: 59 | Attending: General Surgery | Admitting: General Surgery

## 2022-04-03 ENCOUNTER — Encounter (HOSPITAL_COMMUNITY): Payer: Self-pay | Admitting: Emergency Medicine

## 2022-04-03 DIAGNOSIS — T8141XA Infection following a procedure, superficial incisional surgical site, initial encounter: Secondary | ICD-10-CM | POA: Diagnosis not present

## 2022-04-03 DIAGNOSIS — Z853 Personal history of malignant neoplasm of breast: Secondary | ICD-10-CM | POA: Diagnosis not present

## 2022-04-03 DIAGNOSIS — Z8249 Family history of ischemic heart disease and other diseases of the circulatory system: Secondary | ICD-10-CM

## 2022-04-03 DIAGNOSIS — Z79811 Long term (current) use of aromatase inhibitors: Secondary | ICD-10-CM

## 2022-04-03 DIAGNOSIS — Z8616 Personal history of COVID-19: Secondary | ICD-10-CM | POA: Diagnosis not present

## 2022-04-03 DIAGNOSIS — K219 Gastro-esophageal reflux disease without esophagitis: Secondary | ICD-10-CM | POA: Diagnosis present

## 2022-04-03 DIAGNOSIS — Z820 Family history of epilepsy and other diseases of the nervous system: Secondary | ICD-10-CM | POA: Diagnosis not present

## 2022-04-03 DIAGNOSIS — M313 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: Secondary | ICD-10-CM | POA: Diagnosis not present

## 2022-04-03 DIAGNOSIS — Z800 Family history of malignant neoplasm of digestive organs: Secondary | ICD-10-CM | POA: Diagnosis not present

## 2022-04-03 DIAGNOSIS — Z83438 Family history of other disorder of lipoprotein metabolism and other lipidemia: Secondary | ICD-10-CM

## 2022-04-03 DIAGNOSIS — F332 Major depressive disorder, recurrent severe without psychotic features: Secondary | ICD-10-CM | POA: Diagnosis present

## 2022-04-03 DIAGNOSIS — F411 Generalized anxiety disorder: Secondary | ICD-10-CM | POA: Diagnosis present

## 2022-04-03 DIAGNOSIS — A419 Sepsis, unspecified organism: Secondary | ICD-10-CM | POA: Diagnosis present

## 2022-04-03 DIAGNOSIS — L03313 Cellulitis of chest wall: Secondary | ICD-10-CM | POA: Insufficient documentation

## 2022-04-03 DIAGNOSIS — T8149XA Infection following a procedure, other surgical site, initial encounter: Secondary | ICD-10-CM | POA: Diagnosis not present

## 2022-04-03 DIAGNOSIS — N611 Abscess of the breast and nipple: Secondary | ICD-10-CM | POA: Diagnosis present

## 2022-04-03 DIAGNOSIS — Z79899 Other long term (current) drug therapy: Secondary | ICD-10-CM | POA: Diagnosis not present

## 2022-04-03 DIAGNOSIS — N61 Mastitis without abscess: Secondary | ICD-10-CM | POA: Diagnosis not present

## 2022-04-03 DIAGNOSIS — Z8261 Family history of arthritis: Secondary | ICD-10-CM

## 2022-04-03 DIAGNOSIS — Z888 Allergy status to other drugs, medicaments and biological substances status: Secondary | ICD-10-CM | POA: Diagnosis not present

## 2022-04-03 DIAGNOSIS — Z818 Family history of other mental and behavioral disorders: Secondary | ICD-10-CM | POA: Diagnosis not present

## 2022-04-03 DIAGNOSIS — J45909 Unspecified asthma, uncomplicated: Secondary | ICD-10-CM | POA: Diagnosis not present

## 2022-04-03 DIAGNOSIS — Z825 Family history of asthma and other chronic lower respiratory diseases: Secondary | ICD-10-CM

## 2022-04-03 DIAGNOSIS — Z833 Family history of diabetes mellitus: Secondary | ICD-10-CM | POA: Diagnosis not present

## 2022-04-03 DIAGNOSIS — L0291 Cutaneous abscess, unspecified: Secondary | ICD-10-CM | POA: Diagnosis not present

## 2022-04-03 DIAGNOSIS — Z801 Family history of malignant neoplasm of trachea, bronchus and lung: Secondary | ICD-10-CM | POA: Diagnosis not present

## 2022-04-03 DIAGNOSIS — Y838 Other surgical procedures as the cause of abnormal reaction of the patient, or of later complication, without mention of misadventure at the time of the procedure: Secondary | ICD-10-CM | POA: Diagnosis present

## 2022-04-03 DIAGNOSIS — D0512 Intraductal carcinoma in situ of left breast: Secondary | ICD-10-CM | POA: Diagnosis not present

## 2022-04-03 DIAGNOSIS — B9562 Methicillin resistant Staphylococcus aureus infection as the cause of diseases classified elsewhere: Secondary | ICD-10-CM | POA: Diagnosis present

## 2022-04-03 DIAGNOSIS — Z91040 Latex allergy status: Secondary | ICD-10-CM | POA: Diagnosis not present

## 2022-04-03 LAB — CBC WITH DIFFERENTIAL/PLATELET
Abs Immature Granulocytes: 0.06 10*3/uL (ref 0.00–0.07)
Basophils Absolute: 0 10*3/uL (ref 0.0–0.1)
Basophils Relative: 0 %
Eosinophils Absolute: 0.3 10*3/uL (ref 0.0–0.5)
Eosinophils Relative: 3 %
HCT: 33.1 % — ABNORMAL LOW (ref 36.0–46.0)
Hemoglobin: 10.7 g/dL — ABNORMAL LOW (ref 12.0–15.0)
Immature Granulocytes: 1 %
Lymphocytes Relative: 9 %
Lymphs Abs: 1 10*3/uL (ref 0.7–4.0)
MCH: 29.1 pg (ref 26.0–34.0)
MCHC: 32.3 g/dL (ref 30.0–36.0)
MCV: 89.9 fL (ref 80.0–100.0)
Monocytes Absolute: 1 10*3/uL (ref 0.1–1.0)
Monocytes Relative: 9 %
Neutro Abs: 8.6 10*3/uL — ABNORMAL HIGH (ref 1.7–7.7)
Neutrophils Relative %: 78 %
Platelets: 208 10*3/uL (ref 150–400)
RBC: 3.68 MIL/uL — ABNORMAL LOW (ref 3.87–5.11)
RDW: 15.2 % (ref 11.5–15.5)
WBC: 11 10*3/uL — ABNORMAL HIGH (ref 4.0–10.5)
nRBC: 0 % (ref 0.0–0.2)

## 2022-04-03 LAB — BASIC METABOLIC PANEL
Anion gap: 6 (ref 5–15)
BUN: 9 mg/dL (ref 6–20)
CO2: 23 mmol/L (ref 22–32)
Calcium: 8.4 mg/dL — ABNORMAL LOW (ref 8.9–10.3)
Chloride: 108 mmol/L (ref 98–111)
Creatinine, Ser: 0.64 mg/dL (ref 0.44–1.00)
GFR, Estimated: >60 mL/min (ref >60)
Glucose, Bld: 114 mg/dL — ABNORMAL HIGH (ref 70–99)
Potassium: 3.3 mmol/L — ABNORMAL LOW (ref 3.5–5.1)
Sodium: 137 mmol/L (ref 135–145)

## 2022-04-03 LAB — LACTIC ACID, PLASMA: Lactic Acid, Venous: 0.9 mmol/L (ref 0.5–1.9)

## 2022-04-03 MED ORDER — SERTRALINE HCL 100 MG PO TABS
100.0000 mg | ORAL_TABLET | Freq: Every day | ORAL | Status: DC
  Administered 2022-04-04 – 2022-04-09 (×6): 100 mg via ORAL
  Filled 2022-04-03 (×6): qty 1

## 2022-04-03 MED ORDER — ENOXAPARIN SODIUM 40 MG/0.4ML IJ SOSY
40.0000 mg | PREFILLED_SYRINGE | Freq: Every day | INTRAMUSCULAR | Status: DC
  Administered 2022-04-04 – 2022-04-09 (×6): 40 mg via SUBCUTANEOUS
  Filled 2022-04-03 (×6): qty 0.4

## 2022-04-03 MED ORDER — VANCOMYCIN HCL 1750 MG/350ML IV SOLN
1750.0000 mg | INTRAVENOUS | Status: DC
  Administered 2022-04-04 – 2022-04-07 (×4): 1750 mg via INTRAVENOUS
  Filled 2022-04-03 (×4): qty 350

## 2022-04-03 MED ORDER — ONDANSETRON HCL 4 MG/2ML IJ SOLN
4.0000 mg | Freq: Once | INTRAMUSCULAR | Status: AC
  Administered 2022-04-03: 4 mg via INTRAVENOUS
  Filled 2022-04-03: qty 2

## 2022-04-03 MED ORDER — LETROZOLE 2.5 MG PO TABS
2.5000 mg | ORAL_TABLET | Freq: Every day | ORAL | Status: DC
  Administered 2022-04-04 – 2022-04-09 (×6): 2.5 mg via ORAL
  Filled 2022-04-03 (×6): qty 1

## 2022-04-03 MED ORDER — HYDROCODONE-ACETAMINOPHEN 5-325 MG PO TABS
1.0000 | ORAL_TABLET | ORAL | Status: DC | PRN
  Administered 2022-04-04 (×3): 1 via ORAL
  Administered 2022-04-05: 2 via ORAL
  Administered 2022-04-05: 1 via ORAL
  Administered 2022-04-05 – 2022-04-06 (×3): 2 via ORAL
  Administered 2022-04-06: 1 via ORAL
  Administered 2022-04-06 – 2022-04-07 (×5): 2 via ORAL
  Administered 2022-04-08: 1 via ORAL
  Administered 2022-04-08: 2 via ORAL
  Administered 2022-04-08 (×2): 1 via ORAL
  Administered 2022-04-09: 2 via ORAL
  Filled 2022-04-03: qty 2
  Filled 2022-04-03 (×3): qty 1
  Filled 2022-04-03 (×6): qty 2
  Filled 2022-04-03: qty 1
  Filled 2022-04-03 (×2): qty 2
  Filled 2022-04-03 (×2): qty 1
  Filled 2022-04-03 (×2): qty 2
  Filled 2022-04-03: qty 1
  Filled 2022-04-03 (×2): qty 2
  Filled 2022-04-03: qty 1
  Filled 2022-04-03: qty 2

## 2022-04-03 MED ORDER — LEVOFLOXACIN 750 MG PO TABS
750.0000 mg | ORAL_TABLET | Freq: Every day | ORAL | 0 refills | Status: DC
  Filled 2022-04-03: qty 10, 10d supply, fill #0

## 2022-04-03 MED ORDER — ONDANSETRON HCL 4 MG/2ML IJ SOLN: 4.0000 mg | Freq: Four times a day (QID) | INTRAMUSCULAR | Status: DC | PRN

## 2022-04-03 MED ORDER — PIPERACILLIN-TAZOBACTAM 3.375 G IVPB 30 MIN
3.3750 g | Freq: Once | INTRAVENOUS | Status: AC
  Administered 2022-04-03: 3.375 g via INTRAVENOUS
  Filled 2022-04-03: qty 50

## 2022-04-03 MED ORDER — ONDANSETRON 4 MG PO TBDP: 4.0000 mg | ORAL_TABLET | Freq: Four times a day (QID) | ORAL | Status: DC | PRN

## 2022-04-03 MED ORDER — SODIUM CHLORIDE 0.9 % IV SOLN
2.0000 g | Freq: Three times a day (TID) | INTRAVENOUS | Status: DC
  Administered 2022-04-03 – 2022-04-07 (×11): 2 g via INTRAVENOUS
  Filled 2022-04-03 (×11): qty 12.5

## 2022-04-03 MED ORDER — GABAPENTIN 300 MG PO CAPS
300.0000 mg | ORAL_CAPSULE | Freq: Every day | ORAL | Status: DC
  Administered 2022-04-03 – 2022-04-08 (×6): 300 mg via ORAL
  Filled 2022-04-03 (×6): qty 1

## 2022-04-03 MED ORDER — KETOROLAC TROMETHAMINE 15 MG/ML IJ SOLN
15.0000 mg | Freq: Once | INTRAMUSCULAR | Status: AC
  Administered 2022-04-03: 15 mg via INTRAVENOUS
  Filled 2022-04-03: qty 1

## 2022-04-03 MED ORDER — SODIUM CHLORIDE 0.9% FLUSH: 3.0000 mL | INTRAVENOUS | Status: DC | PRN

## 2022-04-03 MED ORDER — MEDIHONEY WOUND/BURN DRESSING EX GEL: CUTANEOUS | 0 refills | Status: DC

## 2022-04-03 MED ORDER — HYDROXYCHLOROQUINE SULFATE 200 MG PO TABS
200.0000 mg | ORAL_TABLET | Freq: Every day | ORAL | Status: DC
  Administered 2022-04-04 – 2022-04-09 (×6): 200 mg via ORAL
  Filled 2022-04-03 (×6): qty 1

## 2022-04-03 MED ORDER — VANCOMYCIN HCL IN DEXTROSE 1-5 GM/200ML-% IV SOLN
1000.0000 mg | Freq: Once | INTRAVENOUS | Status: AC
  Administered 2022-04-03: 1000 mg via INTRAVENOUS
  Filled 2022-04-03: qty 200

## 2022-04-03 MED ORDER — SODIUM CHLORIDE 0.9% FLUSH
3.0000 mL | Freq: Two times a day (BID) | INTRAVENOUS | Status: DC
  Administered 2022-04-03 – 2022-04-09 (×11): 3 mL via INTRAVENOUS

## 2022-04-03 MED ORDER — PANTOPRAZOLE SODIUM 40 MG PO TBEC
40.0000 mg | DELAYED_RELEASE_TABLET | Freq: Every day | ORAL | Status: DC
  Administered 2022-04-04 – 2022-04-09 (×6): 40 mg via ORAL
  Filled 2022-04-03 (×6): qty 1

## 2022-04-03 MED ORDER — SODIUM CHLORIDE 0.9 % IV BOLUS
1000.0000 mL | Freq: Once | INTRAVENOUS | Status: AC
  Administered 2022-04-03: 1000 mL via INTRAVENOUS

## 2022-04-03 MED ORDER — HYDROXYZINE HCL 25 MG PO TABS
25.0000 mg | ORAL_TABLET | Freq: Four times a day (QID) | ORAL | Status: DC | PRN
  Filled 2022-04-03 (×2): qty 1

## 2022-04-03 MED ORDER — SODIUM CHLORIDE 0.9 % IV SOLN: 250.0000 mL | INTRAVENOUS | Status: DC | PRN

## 2022-04-03 NOTE — ED Notes (Signed)
See triage note  States she was sent in by MD for cont'd infection to left breast   States she has had recent procedure to breast area  Conts to have pain and infection

## 2022-04-03 NOTE — H&P (Signed)
[name] is an 51 y.o. female.   Chief Complaint: Left breast cellulitis HPI: Patient is a 51 year old female who comes in secondary to left breast Eglitis.  Patient was recently seen in clinic.  Patient had a recent chronic wound excision in 920.  This was subsequent to a left seed lumpectomy and sentinel lymph node biopsy on 510 followed by radiation therapy.  Patient states that she has been on several antibiotics recently.  She states she had some drainage from her left breast open wound that was purulent and dark.  She was seen in clinic and was directed to the ER for evaluation.  Patient was given antibiotics at an outside ER.  Patient does have a slight leukocytosis.  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[place])    [facility]---  dx 2015,  takes methotrexate wkly, pt stated she is stable    Past Surgical History:  Procedure Laterality Date   ANTERIOR AND POSTERIOR REPAIR N/A 01/14/2021   Procedure: ANTERIOR (CYSTOCELE) AND POSTERIOR REPAIR (RECTOCELE);  Surgeon: Cheri Fowler, MD;  Location: [facility];  Service: Gynecology;  Laterality: N/A;   AUGMENTATION MAMMAPLASTY Bilateral 2010   saline   BREAST BIOPSY Left 10/23/2021   Stereo Bx, X-clip, Ductal carcinoma in situ   BREAST CYST EXCISION Left 03/25/2022   Procedure: EXCISION OF CHRONIC LEFT BREAST WOUND;  Surgeon: Stark Klein, MD;  Location: [facility];  Service: General;   Laterality: Left;   BREAST ENHANCEMENT SURGERY  2009   BREAST LUMPECTOMY     BREAST LUMPECTOMY WITH RADIOACTIVE SEED AND SENTINEL LYMPH NODE BIOPSY Left 11/12/2021   Procedure: LEFT BREAST LUMPECTOMY WITH RADIOACTIVE SEED AND SENTINEL LYMPH NODE BIOPSY;  Surgeon: Stark Klein, MD;  Location: [facility];  Service: General;  Laterality: Left;   CARPAL TUNNEL RELEASE Right 2011   LAPAROSCOPIC SUPRACERVICAL HYSTERECTOMY  02/21/2010   @ [name] by Dr Willis Modena   NASAL SEPTUM SURGERY  01/2018   insertion button prosthesis for septal perforation   RHINOPLASTY  2005   nasal fracture    Family History  Problem Relation Age of Onset   Asthma Mother    Depression Mother    Diabetes Mother    Hyperlipidemia Mother    Hypertension Mother    Mental illness Mother    Arthritis Mother    Pancreatic cancer Mother    Heart disease Father 53       AMI/CABG   Mental illness Sister    Alzheimer's disease Maternal Grandmother    Hyperlipidemia Maternal Grandmother    Hypertension Maternal Grandmother    Heart attack Maternal Grandfather    Lung cancer Maternal Grandfather    Myasthenia gravis Daughter    Charcot-Marie-Tooth disease Daughter    ADD / ADHD Daughter    Pancreatic cancer Maternal Aunt    Social History:  reports that she has never smoked. She has  never used smokeless tobacco. She reports current alcohol use. She reports that she does not use drugs.  Allergies:  Allergies  Allergen Reactions   Kiwi Extract Shortness Of Breath and Swelling   Ativan [Lorazepam] Other (See Comments)    Hyperactivity per patient   Erythromycin Base Nausea And Vomiting   Nitrofurantoin Nausea And Vomiting   Other     Latex self dissolving sutures "work their way out"   Xanax [Alprazolam] Other (See Comments)    Makes patient hyperactive     (Not in a hospital admission)   Results for orders placed or performed during the hospital encounter of 04/03/22 (from the past 48 hour(s))   CBC with Differential     Status: Abnormal   Collection Time: 04/03/22  6:21 PM  Result Value Ref Range   WBC 11.0 (H) 4.0 - 10.5 K/uL   RBC 3.68 (L) 3.87 - 5.11 MIL/uL   Hemoglobin 10.7 (L) 12.0 - 15.0 g/dL   HCT 33.1 (L) 36.0 - 46.0 %   MCV 89.9 80.0 - 100.0 fL   MCH 29.1 26.0 - 34.0 pg   MCHC 32.3 30.0 - 36.0 g/dL   RDW 15.2 11.5 - 15.5 %   Platelets 208 150 - 400 K/uL   nRBC 0.0 0.0 - 0.2 %   Neutrophils Relative % 78 %   Neutro Abs 8.6 (H) 1.7 - 7.7 K/uL   Lymphocytes Relative 9 %   Lymphs Abs 1.0 0.7 - 4.0 K/uL   Monocytes Relative 9 %   Monocytes Absolute 1.0 0.1 - 1.0 K/uL   Eosinophils Relative 3 %   Eosinophils Absolute 0.3 0.0 - 0.5 K/uL   Basophils Relative 0 %   Basophils Absolute 0.0 0.0 - 0.1 K/uL   Immature Granulocytes 1 %   Abs Immature Granulocytes 0.06 0.00 - 0.07 K/uL    Comment: Performed at [facility], [address], [place], [place] [address]  Basic metabolic panel     Status: Abnormal   Collection Time: 04/03/22  6:21 PM  Result Value Ref Range   Sodium 137 135 - 145 mmol/L   Potassium 3.3 (L) 3.5 - 5.1 mmol/L   Chloride 108 98 - 111 mmol/L   CO2 23 22 - 32 mmol/L   Glucose, Bld 114 (H) 70 - 99 mg/dL    Comment: Glucose reference range applies only to samples taken after fasting for at least 8 hours.   BUN 9 6 - 20 mg/dL   Creatinine, Ser 0.64 0.44 - 1.00 mg/dL   Calcium 8.4 (L) 8.9 - 10.3 mg/dL   GFR, Estimated >60 >60 mL/min    Comment: (NOTE) Calculated using the CKD-EPI Creatinine Equation (2021)    Anion gap 6 5 - 15    Comment: Performed at [facility] [address], [place], [place] [address]  Lactic acid, plasma     Status: None   Collection Time: 04/03/22  6:21 PM  Result Value Ref Range   Lactic Acid, Venous 0.9 0.5 - 1.9 mmol/L    Comment: Performed at [facility] [address] [place], [place] [address]   No results found.  Review of Systems  Constitutional: Negative.   HENT: Negative.     Respiratory: Negative.    Cardiovascular: Negative.   Gastrointestinal: Negative.   Neurological: Negative.   All other systems reviewed and are negative.   Blood pressure 114/84, pulse 97, temperature 99.6 F (37.6 C), temperature source Oral, resp. rate Marland Kitchen)  22, height '5\' 3"'$  (1.6 m), weight 79.3 kg, SpO2 96 %. Physical Exam Constitutional:      Appearance: She is well-developed.     Comments: Conversant No acute distress  HENT:     Head: Normocephalic and atraumatic.  Eyes:     General: Lids are normal. No scleral icterus.    Pupils: Pupils are equal, round, and reactive to light.     Comments: Pupils are equal round and reactive No lid lag Moist conjunctiva  Neck:     Thyroid: No thyromegaly.     Trachea: No tracheal tenderness.     Comments: No cervical lymphadenopathy Cardiovascular:     Rate and Rhythm: Normal rate and regular rhythm.     Heart sounds: No murmur heard. Pulmonary:     Effort: Pulmonary effort is normal.     Breath sounds: Normal breath sounds. No wheezing or rales.  Chest:       Comments: L breast erythema  Abdominal:     Tenderness: There is no abdominal tenderness.     Hernia: No hernia is present.  Musculoskeletal:     Cervical back: Normal range of motion and neck supple.  Skin:    General: Skin is warm.     Findings: No rash.     Nails: There is no clubbing.     Comments: Normal skin turgor  Neurological:     Mental Status: She is alert and oriented to person, place, and time.     Comments: Normal gait and station  Psychiatric:        Mood and Affect: Mood normal.        Thought Content: Thought content normal.        Judgment: Judgment normal.     Comments: Appropriate affect      Assessment/Plan 51 year old female with left breast cellulitis. History left breast cancer 1.  We will admit the patient for IV antibiotics, continue with wound dressing changes. 2.  Continue home medications.  Ralene Ok, MD 04/03/2022, 7:11  PM

## 2022-04-03 NOTE — ED Provider Notes (Signed)
[facility] Provider Note    Event Date/Time   First MD Initiated Contact with Patient 04/03/22 1340     (approximate)   History   Chief Complaint: Wound Infection   HPI  [name] is a 51 y.o. 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  She is under the care of Dr. Barry Dienes from [facility] surgery in [place].  On September 20 the patient had repeat surgery to debride the surgical wound to aid in healing, but over the last few days the area has become increasingly really red swollen and painful again with purulent drainage.  She was seen in the clinic today by Dr. Barry Dienes, who planned for direct admission to [facility] for IV antibiotics and further surgical management.  However, there was no bed availability today, and not anticipated through the weekend.  Patient denies any new chest pain or shortness of breath.  She has left breast pain and she had a fever of 102 last night.  Denies abdominal pain, persistent vomiting, constipation or diarrhea.      Physical Exam   Triage Vital Signs: ED Triage Vitals  Enc Vitals Group     BP 04/03/22 1322 (!) 146/92     Pulse Rate 04/03/22 1322 96     Resp 04/03/22 1322 19     Temp 04/03/22 1322 99.1 F (37.3 C)     Temp Source 04/03/22 1322 Oral     SpO2 04/03/22 1322 99 %     Weight 04/03/22 1322 175 lb (79.4 kg)     Height 04/03/22 1423 '5\' 3"'$  (1.6 m)     Head Circumference --      Peak Flow --      Pain Score 04/03/22 1322 8     Pain Loc --      Pain Edu? --      Excl. in [place]? --     Most recent vital signs: Vitals:   04/03/22 1322  BP: (!) 146/92  Pulse: 96  Resp: 19  Temp: 99.1 F (37.3 C)  SpO2: 99%    General: Awake, no distress.  CV:  Good peripheral perfusion.  Regular rate and  rhythm Resp:  Normal effort.  Abd:  No distention.  Soft and nontender Other:  Left breast with a 3 to 4 cm surgical incision in the superolateral quadrant with surrounding induration and tenderness warmth and erythema.  No crepitus.   ED Results / Procedures / Treatments   Labs (all labs ordered are listed, but only abnormal results are displayed) Labs Reviewed - No data to display   EKG    RADIOLOGY    PROCEDURES:  Procedures   MEDICATIONS ORDERED IN ED: Medications  vancomycin (VANCOCIN) IVPB 1000 mg/200 mL premix (1,000 mg Intravenous New Bag/Given 04/03/22 1449)  sodium chloride 0.9 % bolus 1,000 mL (1,000 mLs Intravenous New Bag/Given 04/03/22 1425)  piperacillin-tazobactam (ZOSYN) IVPB 3.375 g (0 g Intravenous Stopped 04/03/22 1448)     IMPRESSION / MDM / ASSESSMENT AND PLAN / ED COURSE  I reviewed the triage vital signs and the nursing notes.                               Patient presents with cellulitis of the  left breast which has failed multiple attempts at outpatient management including repeat courses of antibiotics and surgical debridement.  Patient needs IV antibiotics and admission for further management.  She is not septic.  Discussed with [facility] general surgery who recommends transfer to [facility] so the patient can be cared for by her established surgery team given recent procedure just 9 days ago.  Discussed with [facility], ED Dr. Alvino Chapel who accepts for transfer ED to ED.  Zosyn and vancomycin have been given along with an IV fluid bolus.       FINAL CLINICAL IMPRESSION(S) / ED DIAGNOSES   Final diagnoses:  Surgical site infection  Cellulitis of left breast     Rx / DC Orders   ED Discharge Orders     None        Note:  This document was prepared using Dragon voice recognition software and may include unintentional dictation errors.   Carrie Mew, MD 04/03/22 [phone]

## 2022-04-03 NOTE — ED Triage Notes (Signed)
Pt has a wound on the left breast from cancer tx. Pt sts that is has remained infected for the duration of the tx. Pt sts that her oncologist wanted her to come and get IV Abx and further treatment.

## 2022-04-03 NOTE — Progress Notes (Signed)
Pharmacy Antibiotic Note  [name] is a 51 y.o. female admitted on 04/03/2022 with L breast cellulitis.  Pharmacy has been consulted for Cefepime and Vancomycin dosing.  WBC 11, Tmax 99, LA 0.9 SCr 0.64  Pt received Zosyn 3.375g IV x1 and Vancomycin '1000mg'$  IV x1 at [facility] ED on 9/29.  Plan: Start Cefepime 2g IV q8h  (8 hours after Zosyn dose) Initiate Vancomycin '1750mg'$  IV q24h (eAUC ~500) (start ~18 hours after last dose given small loading dose)    > Goal AUC 400-550    > Check vancomycin levels at steady state Monitor daily CBC, temp, SCr, and for clinical signs of improvement  F/u cultures and de-escalate antibiotics as able   Height: '5\' 3"'$  (160 cm) Weight: 79.3 kg (174 lb 13.2 oz) IBW/kg (Calculated) : 52.4  Temp (24hrs), Avg:99.1 F (37.3 C), Min:98.9 F (37.2 C), Max:99.6 F (37.6 C)  Recent Labs  Lab 04/03/22 1821  WBC 11.0*  CREATININE 0.64  LATICACIDVEN 0.9    Estimated Creatinine Clearance: 82.1 mL/min (by C-G formula based on SCr of 0.64 mg/dL).    Allergies  Allergen Reactions   Kiwi Extract Shortness Of Breath and Swelling   Ativan [Lorazepam] Other (See Comments)    Hyperactivity per patient   Erythromycin Base Nausea And Vomiting   Nitrofurantoin Nausea And Vomiting   Other     Latex self dissolving sutures "work their way out"   Xanax [Alprazolam] Other (See Comments)    Makes patient hyperactive     Antimicrobials this admission: Zosyn 9/29 x1 Cefepime 9/29 >>  Vancomycin 9/29 >>   Dose adjustments this admission: N/A  Microbiology results: 9/29 BCx: pending  Thank you for allowing pharmacy to be a part of this patient's care.  Luisa Hart, PharmD, BCPS Clinical Pharmacist 04/03/2022 11:14 PM   Please refer to [facility] for pharmacy phone number

## 2022-04-03 NOTE — ED Provider Notes (Signed)
[facility] EMERGENCY DEPARTMENT Provider Note   CSN: [id number] Arrival date & time: 04/03/22  1654     History  Chief Complaint  Patient presents with   Cellulitis    TENIKA KEERAN is a 51 y.o. female.  Patient is a 51 year old female with a past medical history of breast cancer status post lumpectomy and radiation therapy presenting to the emergency department with cellulitis from her surgical site.  The patient states that since her lumpectomy she has always had a small area of wound dehiscence.  She states that after radiation therapy this wound was revised.  She states that she initially had been doing well but over the last few days she had noticed increasing foul-smelling brown drainage as well as increasing redness and swelling to her breast.  She was seen as an outpatient by her surgery team who started her on Augmentin.  She states that last night she spiked a fever of 102 and her surgeon recommended that she come be admitted to the hospital for IV antibiotics.  She states that she has been nauseous but denies any vomiting.  She denies any abdominal pain or shortness of breath.  She was initially seen at [place] and was transferred here to admission under her surgeons care.  The history is provided by the patient.       Home Medications Prior to Admission medications   Medication Sig Start Date End Date Taking? Authorizing Provider  albuterol (VENTOLIN HFA) 108 (90 Base) MCG/ACT inhaler Inhale 2 puffs into the lungs every 6 (six) hours as needed for wheezing or shortness of breath (cough, shortness of breath or wheezing.). 06/24/20  Yes Flinchum, Kelby Aline, FNP  folic acid (FOLVITE) 1 MG tablet Take 1 tablet (1 mg total) by mouth once daily Patient taking differently: Take 1 mg by mouth daily. 08/20/21  Yes   gabapentin (NEURONTIN) 300 MG capsule Take 1 capsule (300 mg total) by mouth at bedtime Patient taking differently: Take 300 mg by mouth daily  as needed (For pain). 10/23/21  Yes Patel, Mayur K, MD  hydroxychloroquine (PLAQUENIL) 200 MG tablet Take 1 tablet (200 mg total) by mouth 2 (two) times daily Patient taking differently: Take 200 mg by mouth daily. 10/23/21  Yes   hydrOXYzine (VISTARIL) 25 MG capsule Take 1 capsule (25 mg total) by mouth daily as needed. Patient taking differently: Take 25 mg by mouth daily. 01/27/22  Yes   ibuprofen (ADVIL) 800 MG tablet Take 1 tablet (800 mg total) by mouth every 8 (eight) hours as needed. Patient taking differently: Take 800 mg by mouth every 8 (eight) hours as needed for mild pain. 03/25/22  Yes Stark Klein, MD  letrozole [facility]) 2.5 MG tablet Take 1 tablet (2.5 mg total) by mouth daily. 01/29/22  Yes Lloyd Huger, MD  methotrexate (RHEUMATREX) 2.5 MG tablet Take 8 tablets (20 mg total) by mouth every 7 (seven) days 02/24/22  Yes   montelukast (SINGULAIR) 10 MG tablet Take 1 tablet (10 mg total) by mouth daily. 03/13/22  Yes   pantoprazole (PROTONIX) 40 MG tablet TAKE ONE TABLET BY MOUTH TWICE A DAY BEFORE FOOD Patient taking differently: Take 40 mg by mouth 2 (two) times daily. 01/27/22  Yes   sertraline (ZOLOFT) 100 MG tablet Take 1.5 tablets (150 mg total) by mouth daily. Patient taking differently: Take 100 mg by mouth daily. 01/27/22  Yes   sucralfate (CARAFATE) 1 g tablet Take 1 g by mouth 2 (two) times  daily as needed (Antacid). 10/21/20  Yes [provider]  ADVAIR DISKUS 100-50 MCG/DOSE AEPB INHALE 1 PUFF INTO THE LUNGS TWICE DAILY Patient not taking: Reported on 04/03/2022 07/26/17   [name], MD  amoxicillin-clavulanate (AUGMENTIN) 875-125 MG tablet Take 1 tablet by mouth every 12 (twelve) hours for 7 days Patient not taking: Reported on 04/03/2022 04/02/22     gabapentin (NEURONTIN) 300 MG capsule Take one capsule by mouth at bedtime Patient not taking: Reported on 04/03/2022 02/24/22     HYDROcodone-acetaminophen (NORCO/VICODIN) 5-325 MG tablet Take 1-2 tablets by mouth  every 4 (four) hours as needed for moderate pain or severe pain. Patient not taking: Reported on 04/03/2022 03/25/22   Stark Klein, MD  HYDROcodone-acetaminophen (NORCO/VICODIN) 5-325 MG tablet Take 1-2 tablets by mouth every 4 (four) hours as needed for pain for up to 5 days Patient not taking: Reported on 04/03/2022 03/26/22     hydroxychloroquine (PLAQUENIL) 200 MG tablet Take 1 tablet (200 mg total) by mouth 2 (two) times daily Patient not taking: Reported on 04/03/2022 02/24/22     levofloxacin (LEVAQUIN) 750 MG tablet Take 1 tablet (750 mg total) by mouth daily for 10 days Patient not taking: Reported on 04/03/2022 04/03/22     lidocaine-nystatin-hydrocortisone-diphenhydrAMINE Swish and spit 5 mLs by mouth every 6 (six) hours as needed Patient not taking: Reported on 04/03/2022 04/02/22     Wound Dressings (MEDIHONEY WOUND/BURN DRESSING) GEL Apply 1 inch topically 2 (two) times daily Apply a nickel thick layer of MediHoney directly to the wound twice daily. 04/03/22     Wound Dressings (MEDIHONEY WOUND/BURN DRESSING) GEL Apply 1 inch topically 2 (two) times daily 04/03/22         Allergies    Kiwi extract, Ativan [lorazepam], Erythromycin base, Nitrofurantoin, Other, and Xanax [alprazolam]    Review of Systems   Review of Systems  Physical Exam Updated Vital Signs BP 104/67   Pulse 89   Temp 98.7 F (37.1 C) (Oral)   Resp (!) 26   Ht '5\' 3"'$  (1.6 m)   Wt 79.3 kg   SpO2 94%   BMI 30.97 kg/m  Physical Exam Vitals and nursing note reviewed.  Constitutional:      General: She is not in acute distress.    Appearance: Normal appearance.  HENT:     Head: Normocephalic and atraumatic.     Nose: Nose normal.     Mouth/Throat:     Mouth: Mucous membranes are moist.  Eyes:     Extraocular Movements: Extraocular movements intact.     Conjunctiva/sclera: Conjunctivae normal.  Cardiovascular:     Rate and Rhythm: Normal rate and regular rhythm.     Pulses: Normal pulses.     Heart sounds:  Normal heart sounds.  Pulmonary:     Effort: Pulmonary effort is normal.     Breath sounds: Normal breath sounds.  Abdominal:     General: Abdomen is flat.     Palpations: Abdomen is soft.     Tenderness: There is no abdominal tenderness.  Musculoskeletal:        General: Normal range of motion.     Cervical back: Normal range of motion and neck supple.  Skin:    Comments: Approximately 2 cm area on the left lateral breast of wound dehiscence with packing in place, no active drainage, significant erythema and warmth with swelling and induration of the entire left breast, no palpable fluctuance  Neurological:     General: No focal  deficit present.     Mental Status: She is alert and oriented to person, place, and time.  Psychiatric:        Mood and Affect: Mood normal.        Behavior: Behavior normal.     ED Results / Procedures / Treatments   Labs (all labs ordered are listed, but only abnormal results are displayed) Labs Reviewed  CBC WITH DIFFERENTIAL/PLATELET - Abnormal; Notable for the following components:      Result Value   WBC 11.0 (*)    RBC 3.68 (*)    Hemoglobin 10.7 (*)    HCT 33.1 (*)    Neutro Abs 8.6 (*)    All other components within normal limits  BASIC METABOLIC PANEL - Abnormal; Notable for the following components:   Potassium 3.3 (*)    Glucose, Bld 114 (*)    Calcium 8.4 (*)    All other components within normal limits  CULTURE, BLOOD (ROUTINE X 2)  CULTURE, BLOOD (ROUTINE X 2)  LACTIC ACID, PLASMA  COMPREHENSIVE METABOLIC PANEL  CBC    EKG None  Radiology No results found.  Procedures Procedures    Medications Ordered in ED Medications  hydroxychloroquine (PLAQUENIL) tablet 200 mg (has no administration in time range)  hydrOXYzine (ATARAX) tablet 25 mg (has no administration in time range)  sertraline (ZOLOFT) tablet 100 mg (has no administration in time range)  pantoprazole (PROTONIX) EC tablet 40 mg (has no administration in time  range)  letrozole [facility]) tablet 2.5 mg (has no administration in time range)  gabapentin (NEURONTIN) capsule 300 mg (300 mg Oral Given 04/03/22 2330)  HYDROcodone-acetaminophen (NORCO/VICODIN) 5-325 MG per tablet 1-2 tablet (has no administration in time range)  enoxaparin (LOVENOX) injection 40 mg (has no administration in time range)  sodium chloride flush (NS) 0.9 % injection 3 mL (3 mLs Intravenous Given 04/03/22 2100)  sodium chloride flush (NS) 0.9 % injection 3 mL (has no administration in time range)  0.9 %  sodium chloride infusion (has no administration in time range)  ondansetron (ZOFRAN-ODT) disintegrating tablet 4 mg (has no administration in time range)    Or  ondansetron (ZOFRAN) injection 4 mg (has no administration in time range)  ceFEPIme (MAXIPIME) 2 g in sodium chloride 0.9 % 100 mL IVPB (2 g Intravenous New Bag/Given 04/03/22 2333)  vancomycin (VANCOREADY) IVPB 1750 mg/350 mL (has no administration in time range)  ketorolac (TORADOL) 15 MG/ML injection 15 mg (15 mg Intravenous Given 04/03/22 1831)  ondansetron (ZOFRAN) injection 4 mg (4 mg Intravenous Given 04/03/22 1831)    ED Course/ Medical Decision Making/ A&P                           Medical Decision Making This patient presents to the ED with chief complaint(s) of cellulitis of surgical site with pertinent past medical history of breast cancer status postlumpectomy and radiation which further complicates the presenting complaint. The complaint involves an extensive differential diagnosis and also carries with it a high risk of complications and morbidity.    The differential diagnosis includes cellulitis, abscess, sepsis  Additional history obtained: Additional history obtained from N/A Records reviewed previous surgery notes  ED Course and Reassessment: Patient was initially evaluated at [facility] emergency department and was transferred here for surgical evaluation for concern for sepsis secondary to her  cellulitis.  She received vancomycin and Zosyn at the outside hospital.  Labs will be ordered here to further  evaluate for sepsis and surgery will be consulted.  Independent labs interpretation:  The following labs were independently interpreted: Mild leukocytosis and mild hypokalemia  Independent visualization of imaging: N/A  Consultation: - Consulted or discussed management/test interpretation w/ external professional: General surgery  Consideration for admission or further workup: Requires admission for IV antibiotics for her cellulitis Social Determinants of health: N/A    Amount and/or Complexity of Data Reviewed Labs: ordered.  Risk Prescription drug management. Decision regarding hospitalization.          Final Clinical Impression(s) / ED Diagnoses Final diagnoses:  Cellulitis of left breast    Rx / DC Orders ED Discharge Orders     None         Kemper Durie, DO 04/04/22 0001

## 2022-04-03 NOTE — ED Notes (Signed)
[facility]  ED  CALLED  PER  DR  Joni Fears  MD

## 2022-04-03 NOTE — ED Triage Notes (Signed)
Transfer from [facility] for left breast cellulitis. Hx of breast cancer with debridement 9 days ago. Here for surgery team to admit.

## 2022-04-04 LAB — COMPREHENSIVE METABOLIC PANEL
ALT: 8 U/L (ref 0–44)
AST: 14 U/L — ABNORMAL LOW (ref 15–41)
Albumin: 2.9 g/dL — ABNORMAL LOW (ref 3.5–5.0)
Alkaline Phosphatase: 50 U/L (ref 38–126)
Anion gap: 5 (ref 5–15)
BUN: 12 mg/dL (ref 6–20)
CO2: 25 mmol/L (ref 22–32)
Calcium: 8.5 mg/dL — ABNORMAL LOW (ref 8.9–10.3)
Chloride: 112 mmol/L — ABNORMAL HIGH (ref 98–111)
Creatinine, Ser: 0.66 mg/dL (ref 0.44–1.00)
GFR, Estimated: >60 mL/min (ref >60)
Glucose, Bld: 119 mg/dL — ABNORMAL HIGH (ref 70–99)
Potassium: 4.4 mmol/L (ref 3.5–5.1)
Sodium: 142 mmol/L (ref 135–145)
Total Bilirubin: 0.4 mg/dL (ref 0.3–1.2)
Total Protein: 5.8 g/dL — ABNORMAL LOW (ref 6.5–8.1)

## 2022-04-04 LAB — CBC
HCT: 31.3 % — ABNORMAL LOW (ref 36.0–46.0)
Hemoglobin: 10.2 g/dL — ABNORMAL LOW (ref 12.0–15.0)
MCH: 29.7 pg (ref 26.0–34.0)
MCHC: 32.6 g/dL (ref 30.0–36.0)
MCV: 91 fL (ref 80.0–100.0)
Platelets: 205 10*3/uL (ref 150–400)
RBC: 3.44 MIL/uL — ABNORMAL LOW (ref 3.87–5.11)
RDW: 15.4 % (ref 11.5–15.5)
WBC: 8.8 10*3/uL (ref 4.0–10.5)
nRBC: 0 % (ref 0.0–0.2)

## 2022-04-04 NOTE — ED Notes (Signed)
Dressing changed to left breast,

## 2022-04-04 NOTE — ED Notes (Signed)
Patient resting without any c/o pain or discomfort. No s/s of any distress. VSS. Will continue to monitor

## 2022-04-04 NOTE — ED Notes (Signed)
Breakfast provided.

## 2022-04-04 NOTE — Progress Notes (Signed)
Subjective: General malaise and achiness improved today, but still with a lot of pain in her left breast.  ROS: See above, otherwise other systems negative  Objective: Vital signs in last 24 hours: Temp:  [97.2 F (36.2 C)-99.6 F (37.6 C)] 98.2 F (36.8 C) (09/30 0644) Pulse Rate:  [78-98] 79 (09/30 0700) Resp:  [15-26] 16 (09/30 0700) BP: (94-146)/(65-92) 94/68 (09/30 0700) SpO2:  [91 %-99 %] 95 % (09/30 0700) Weight:  [79.3 kg-79.4 kg] 79.3 kg (09/29 1706)    Intake/Output from previous day: 09/29 0701 - 09/30 0700 In: 100 [IV Piggyback:100] Out: -  Intake/Output this shift: No intake/output data recorded.  PE: Gen: NAD Breast: left breast still with cellulitis and wound open with some fibrinous tissue mixed with some granulation tissue present.  Still edematous and indurated around the wound.  Lab Results:  Recent Labs    04/03/22 1821 04/04/22 0605  WBC 11.0* 8.8  HGB 10.7* 10.2*  HCT 33.1* 31.3*  PLT 208 205   BMET Recent Labs    04/03/22 1821 04/04/22 0605  NA 137 142  K 3.3* 4.4  CL 108 112*  CO2 23 25  GLUCOSE 114* 119*  BUN 9 12  CREATININE 0.64 0.66  CALCIUM 8.4* 8.5*   PT/INR No results for input(s): "LABPROT", "INR" in the last 72 hours. CMP     Component Value Date/Time   NA 142 04/04/2022 0605   NA 140 06/10/2016 1434   NA 139 01/02/2014 1431   K 4.4 04/04/2022 0605   K 3.2 (L) 01/02/2014 1431   CL 112 (H) 04/04/2022 0605   CL 101 01/02/2014 1431   CO2 25 04/04/2022 0605   CO2 23 01/02/2014 1431   GLUCOSE 119 (H) 04/04/2022 0605   GLUCOSE 118 (H) 01/02/2014 1431   BUN 12 04/04/2022 0605   BUN 11 06/10/2016 1434   BUN 14 01/02/2014 1431   CREATININE 0.66 04/04/2022 0605   CREATININE 0.75 12/26/2015 0931   CALCIUM 8.5 (L) 04/04/2022 0605   CALCIUM 9.8 01/02/2014 1431   PROT 5.8 (L) 04/04/2022 0605   PROT 6.8 06/10/2016 1434   ALBUMIN 2.9 (L) 04/04/2022 0605   ALBUMIN 4.0 06/10/2016 1434   AST 14 (L) 04/04/2022 0605    ALT 8 04/04/2022 0605   ALKPHOS 50 04/04/2022 0605   BILITOT 0.4 04/04/2022 0605   BILITOT 0.5 06/10/2016 1434   GFRNONAA >60 04/04/2022 0605   GFRNONAA >60 01/02/2014 1431   GFRAA 121 06/10/2016 1434   GFRAA >60 01/02/2014 1431   Lipase     Component Value Date/Time   LIPASE 30 06/20/2020 0755       Studies/Results: No results found.  Anti-infectives: Anti-infectives (From admission, onward)    Start     Dose/Rate Route Frequency Ordered Stop   04/04/22 1000  hydroxychloroquine (PLAQUENIL) tablet 200 mg        200 mg Oral Daily 04/03/22 1921     04/04/22 0800  vancomycin (VANCOREADY) IVPB 1750 mg/350 mL        1,750 mg 175 mL/hr over 120 Minutes Intravenous Every 24 hours 04/03/22 2230     04/03/22 2230  ceFEPIme (MAXIPIME) 2 g in sodium chloride 0.9 % 100 mL IVPB        2 g 200 mL/hr over 30 Minutes Intravenous Every 8 hours 04/03/22 2230          Assessment/Plan  H/o L breast cancer L breast cellulitis -cont IV abx therapy -local  wound care -mobilize, pulm toilet  FEN - regular VTE - lovenox ID - cefepime/vanc    LOS: 1 day    Henreitta Cea , [facility] Surgery 04/04/2022, 7:48 AM Please see Amion for pager number during day hours 7:00am-4:30pm or 7:00am -11:30am on weekends

## 2022-04-04 NOTE — Plan of Care (Signed)
  Problem: Education: Goal: Knowledge of General Education information will improve Description: Including pain rating scale, medication(s)/side effects and non-pharmacologic comfort measures Outcome: Progressing   Problem: Health Behavior/Discharge Planning: Goal: Ability to manage health-related needs will improve Outcome: Progressing   Problem: Clinical Measurements: Goal: Ability to maintain clinical measurements within normal limits will improve Outcome: Progressing   Problem: Clinical Measurements: Goal: Diagnostic test results will improve Outcome: Progressing   Problem: Activity: Goal: Risk for activity intolerance will decrease Outcome: Progressing   Problem: Coping: Goal: Level of anxiety will decrease Outcome: Progressing

## 2022-04-04 NOTE — ED Notes (Signed)
ED TO INPATIENT HANDOFF REPORT  ED Nurse Name and Phone #: [phone]  S Name/Age/Gender [name] 51 y.o. female Room/Bed: 038C/038C  Code Status   Code Status: Full Code  Home/SNF/Other Home Patient oriented to: self, place, time, and situation Is this baseline? Yes   Triage Complete: Triage complete  Chief Complaint Breast infection [N61.0]  Triage Note Transfer from [facility] for left breast cellulitis. Hx of breast cancer with debridement 9 days ago. Here for surgery team to admit.    Allergies Allergies  Allergen Reactions   Kiwi Extract Shortness Of Breath and Swelling   Ativan [Lorazepam] Other (See Comments)    Hyperactivity per patient   Erythromycin Base Nausea And Vomiting   Nitrofurantoin Nausea And Vomiting   Other     Latex self dissolving sutures "work their way out"   Xanax [Alprazolam] Other (See Comments)    Makes patient hyperactive     Level of Care/Admitting Diagnosis ED Disposition     ED Disposition  Admit   Condition  --   [place]: [place] [[id number]]  Level of Care: Med-Surg [16]  May admit patient to [facility] or [facility] if equivalent level of care is available:: No  Covid Evaluation: Asymptomatic - no recent exposure (last 10 days) testing not required  Diagnosis: Breast infection [341937]  Admitting Physician: Ralene Ok [phone]  Attending Physician: Stark Klein [[id number]]  Bed request comments: 6N  Certification:: I certify this patient will need inpatient services for at least 2 midnights  Estimated Length of Stay: 4          B Medical/Surgery History Past Medical History:  Diagnosis Date   Asthma    followed by pcp---  pt stated mild seasonal asthma   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   Female cystocele    symptomatic   GAD (generalized anxiety disorder)    GERD (gastroesophageal reflux disease)    Hiatal hernia    History of 2019 novel  coronavirus disease (COVID-19) 07/2020   per pt had mild symptoms that resolved   MDD (major depressive disorder)    Mixed incontinence urge and stress    Wears glasses    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[place])    [facility]---  dx 2015,  takes methotrexate wkly, pt stated she is stable   Past Surgical History:  Procedure Laterality Date   ANTERIOR AND POSTERIOR REPAIR N/A 01/14/2021   Procedure: ANTERIOR (CYSTOCELE) AND POSTERIOR REPAIR (RECTOCELE);  Surgeon: Cheri Fowler, MD;  Location: [facility];  Service: Gynecology;  Laterality: N/A;   AUGMENTATION MAMMAPLASTY Bilateral 2010   saline   BREAST BIOPSY Left 10/23/2021   Stereo Bx, X-clip, Ductal carcinoma in situ   BREAST CYST EXCISION Left 03/25/2022   Procedure: EXCISION OF CHRONIC LEFT BREAST WOUND;  Surgeon: Stark Klein, MD;  Location: [facility];  Service: General;  Laterality: Left;   BREAST ENHANCEMENT SURGERY  2009   BREAST LUMPECTOMY     BREAST LUMPECTOMY WITH RADIOACTIVE SEED AND SENTINEL LYMPH NODE BIOPSY Left 11/12/2021   Procedure: LEFT BREAST LUMPECTOMY WITH RADIOACTIVE SEED AND SENTINEL LYMPH NODE BIOPSY;  Surgeon: Stark Klein, MD;  Location: [facility];  Service: General;  Laterality: Left;   CARPAL TUNNEL RELEASE Right 2011   LAPAROSCOPIC SUPRACERVICAL HYSTERECTOMY  02/21/2010   @ [place] by Dr Willis Modena   NASAL SEPTUM SURGERY  01/2018   insertion  button prosthesis for septal perforation   RHINOPLASTY  2005   nasal fracture     A IV Location/Drains/Wounds Patient Lines/Drains/Airways Status     Active Line/Drains/Airways     Name Placement date Placement time Site Days   Peripheral IV 04/03/22 20 G Anterior;Right Forearm 04/03/22  1425  Forearm  1   Peripheral IV 04/03/22 20 G Anterior;Proximal;Right Forearm 04/03/22  1831  Forearm  1   Incision (Closed) 01/14/21 Vagina Other (Comment) 01/14/21  0936  -- 445    Incision (Closed) 11/12/21 Breast Left 11/12/21  1310  -- 143   Incision (Closed) 11/12/21 Axilla Left 11/12/21  1310  -- 143   Incision (Closed) 03/25/22 Breast Left 03/25/22  1655  -- 10            Intake/Output Last 24 hours  Intake/Output Summary (Last 24 hours) at 04/04/2022 1136 Last data filed at 04/04/2022 0006 Gross per 24 hour  Intake 100 ml  Output --  Net 100 ml    Labs/Imaging Results for orders placed or performed during the hospital encounter of 04/03/22 (from the past 48 hour(s))  CBC with Differential     Status: Abnormal   Collection Time: 04/03/22  6:21 PM  Result Value Ref Range   WBC 11.0 (H) 4.0 - 10.5 K/uL   RBC 3.68 (L) 3.87 - 5.11 MIL/uL   Hemoglobin 10.7 (L) 12.0 - 15.0 g/dL   HCT 33.1 (L) 36.0 - 46.0 %   MCV 89.9 80.0 - 100.0 fL   MCH 29.1 26.0 - 34.0 pg   MCHC 32.3 30.0 - 36.0 g/dL   RDW 15.2 11.5 - 15.5 %   Platelets 208 150 - 400 K/uL   nRBC 0.0 0.0 - 0.2 %   Neutrophils Relative % 78 %   Neutro Abs 8.6 (H) 1.7 - 7.7 K/uL   Lymphocytes Relative 9 %   Lymphs Abs 1.0 0.7 - 4.0 K/uL   Monocytes Relative 9 %   Monocytes Absolute 1.0 0.1 - 1.0 K/uL   Eosinophils Relative 3 %   Eosinophils Absolute 0.3 0.0 - 0.5 K/uL   Basophils Relative 0 %   Basophils Absolute 0.0 0.0 - 0.1 K/uL   Immature Granulocytes 1 %   Abs Immature Granulocytes 0.06 0.00 - 0.07 K/uL    Comment: Performed at [facility], [place] [address], [place], [place] [address]  Basic metabolic panel     Status: Abnormal   Collection Time: 04/03/22  6:21 PM  Result Value Ref Range   Sodium 137 135 - 145 mmol/L   Potassium 3.3 (L) 3.5 - 5.1 mmol/L   Chloride 108 98 - 111 mmol/L   CO2 23 22 - 32 mmol/L   Glucose, Bld 114 (H) 70 - 99 mg/dL    Comment: Glucose reference range applies only to samples taken after fasting for at least 8 hours.   BUN 9 6 - 20 mg/dL   Creatinine, Ser 0.64 0.44 - 1.00 mg/dL   Calcium 8.4 (L) 8.9 - 10.3 mg/dL   GFR, Estimated >60 >60 mL/min     Comment: (NOTE) Calculated using the CKD-EPI Creatinine Equation (2021)    Anion gap 6 5 - 15    Comment: Performed at [facility] [address], [place], [place] [address]  Lactic acid, plasma     Status: None   Collection Time: 04/03/22  6:21 PM  Result Value Ref Range   Lactic Acid, Venous 0.9 0.5 - 1.9 mmol/L  Comment: Performed at [facility], [place] [address] [place], [place] [address]  Comprehensive metabolic panel     Status: Abnormal   Collection Time: 04/04/22  6:05 AM  Result Value Ref Range   Sodium 142 135 - 145 mmol/L   Potassium 4.4 3.5 - 5.1 mmol/L   Chloride 112 (H) 98 - 111 mmol/L   CO2 25 22 - 32 mmol/L   Glucose, Bld 119 (H) 70 - 99 mg/dL    Comment: Glucose reference range applies only to samples taken after fasting for at least 8 hours.   BUN 12 6 - 20 mg/dL   Creatinine, Ser 0.66 0.44 - 1.00 mg/dL   Calcium 8.5 (L) 8.9 - 10.3 mg/dL   Total Protein 5.8 (L) 6.5 - 8.1 g/dL   Albumin 2.9 (L) 3.5 - 5.0 g/dL   AST 14 (L) 15 - 41 U/L   ALT 8 0 - 44 U/L   Alkaline Phosphatase 50 38 - 126 U/L   Total Bilirubin 0.4 0.3 - 1.2 mg/dL   GFR, Estimated >60 >60 mL/min    Comment: (NOTE) Calculated using the CKD-EPI Creatinine Equation (2021)    Anion gap 5 5 - 15    Comment: Performed at [facility], [place] [address], [place], [place] [address]  CBC     Status: Abnormal   Collection Time: 04/04/22  6:05 AM  Result Value Ref Range   WBC 8.8 4.0 - 10.5 K/uL   RBC 3.44 (L) 3.87 - 5.11 MIL/uL   Hemoglobin 10.2 (L) 12.0 - 15.0 g/dL   HCT 31.3 (L) 36.0 - 46.0 %   MCV 91.0 80.0 - 100.0 fL   MCH 29.7 26.0 - 34.0 pg   MCHC 32.6 30.0 - 36.0 g/dL   RDW 15.4 11.5 - 15.5 %   Platelets 205 150 - 400 K/uL   nRBC 0.0 0.0 - 0.2 %    Comment: Performed at [facility], [place] [address] [place], [place] [address]   No results found.  Pending Labs Unresulted Labs (From admission, onward)     Start     Ordered   04/04/22 0500  CBC  Daily at 5am,   R       04/03/22 2316   04/03/22 1749  Culture, blood (routine x 2)  BLOOD CULTURE X 2,   R (with STAT occurrences)      04/03/22 1749            Vitals/Pain Today's Vitals   04/04/22 1026 04/04/22 1050 04/04/22 1130 04/04/22 1134  BP:  91/62 105/66   Pulse:  81 80   Resp:  18 19   Temp:  98.4 F (36.9 C)    TempSrc:      SpO2:  92% 100%   Weight:      Height:      PainSc: 0-No pain   4     Isolation Precautions No active isolations  Medications Medications  hydroxychloroquine (PLAQUENIL) tablet 200 mg (200 mg Oral Given 04/04/22 0957)  hydrOXYzine (ATARAX) tablet 25 mg (has no administration in time range)  sertraline (ZOLOFT) tablet 100 mg (100 mg Oral Given 04/04/22 0957)  pantoprazole (PROTONIX) EC tablet 40 mg (40 mg Oral Given 04/04/22 0957)  letrozole [facility]) tablet 2.5 mg (2.5 mg Oral Given 04/04/22 0956)  gabapentin (NEURONTIN) capsule 300 mg (300 mg Oral Given 04/03/22 2330)  HYDROcodone-acetaminophen (NORCO/VICODIN) 5-325 MG per tablet 1-2 tablet (1 tablet Oral Given 04/04/22 0911)  enoxaparin (LOVENOX) injection 40 mg (40 mg Subcutaneous Given 04/04/22 0956)  sodium chloride flush (NS) 0.9 % injection 3 mL (3 mLs Intravenous Not Given 04/04/22 0903)  sodium chloride flush (NS) 0.9 % injection 3 mL (has no administration in time range)  0.9 %  sodium chloride infusion (has no administration in time range)  ondansetron (ZOFRAN-ODT) disintegrating tablet 4 mg (has no administration in time range)    Or  ondansetron (ZOFRAN) injection 4 mg (has no administration in time range)  ceFEPIme (MAXIPIME) 2 g in sodium chloride 0.9 % 100 mL IVPB (0 g Intravenous Stopped 04/04/22 0728)  vancomycin (VANCOREADY) IVPB 1750 mg/350 mL (0 mg Intravenous Stopped 04/04/22 1026)  ketorolac (TORADOL) 15 MG/ML injection 15 mg (15 mg Intravenous Given 04/03/22 1831)  ondansetron (ZOFRAN) injection 4 mg (4 mg Intravenous Given 04/03/22 1831)    Mobility walks Low fall risk   Focused  Assessments    R Recommendations: See Admitting Provider Note  Report given to:   Additional Notes:

## 2022-04-05 LAB — CBC
HCT: 29.7 % — ABNORMAL LOW (ref 36.0–46.0)
Hemoglobin: 9.8 g/dL — ABNORMAL LOW (ref 12.0–15.0)
MCH: 29.1 pg (ref 26.0–34.0)
MCHC: 33 g/dL (ref 30.0–36.0)
MCV: 88.1 fL (ref 80.0–100.0)
Platelets: 208 10*3/uL (ref 150–400)
RBC: 3.37 MIL/uL — ABNORMAL LOW (ref 3.87–5.11)
RDW: 15.1 % (ref 11.5–15.5)
WBC: 7.6 10*3/uL (ref 4.0–10.5)
nRBC: 0 % (ref 0.0–0.2)

## 2022-04-05 NOTE — Progress Notes (Signed)
   Subjective/Chief Complaint: Doing better    Objective: Vital signs in last 24 hours: Temp:  [98.6 F (37 C)-100.3 F (37.9 C)] 99.3 F (37.4 C) (10/01 1146) Pulse Rate:  [79-90] 88 (10/01 1146) Resp:  [16-18] 17 (10/01 1146) BP: (99-112)/(60-71) 104/64 (10/01 1146) SpO2:  [96 %-100 %] 96 % (10/01 1146) Last BM Date : 04/03/22  Intake/Output from previous day: 09/30 0701 - 10/01 0700 In: 440 [P.O.:240; IV Piggyback:200] Out: -  Intake/Output this shift: Total I/O In: 220 [P.O.:220] Out: -   Left breast wound clean erythema   Lab Results:  Recent Labs    04/04/22 0605 04/05/22 0433  WBC 8.8 7.6  HGB 10.2* 9.8*  HCT 31.3* 29.7*  PLT 205 208   BMET Recent Labs    04/03/22 1821 04/04/22 0605  NA 137 142  K 3.3* 4.4  CL 108 112*  CO2 23 25  GLUCOSE 114* 119*  BUN 9 12  CREATININE 0.64 0.66  CALCIUM 8.4* 8.5*   PT/INR No results for input(s): "LABPROT", "INR" in the last 72 hours. ABG No results for input(s): "PHART", "HCO3" in the last 72 hours.  Invalid input(s): "PCO2", "PO2"  Studies/Results: No results found.  Anti-infectives: Anti-infectives (From admission, onward)    Start     Dose/Rate Route Frequency Ordered Stop   04/04/22 1000  hydroxychloroquine (PLAQUENIL) tablet 200 mg        200 mg Oral Daily 04/03/22 1921     04/04/22 0800  vancomycin (VANCOREADY) IVPB 1750 mg/350 mL        1,750 mg 175 mL/hr over 120 Minutes Intravenous Every 24 hours 04/03/22 2230     04/03/22 2230  ceFEPIme (MAXIPIME) 2 g in sodium chloride 0.9 % 100 mL IVPB        2 g 200 mL/hr over 30 Minutes Intravenous Every 8 hours 04/03/22 2230         Assessment/Plan: Left breast mastitis  Improved on ABX  Hopefully home Monday    LOS: 2 days    Turner Daniels MD  04/05/2022

## 2022-04-06 LAB — CBC
HCT: 28.1 % — ABNORMAL LOW (ref 36.0–46.0)
Hemoglobin: 9.4 g/dL — ABNORMAL LOW (ref 12.0–15.0)
MCH: 29.4 pg (ref 26.0–34.0)
MCHC: 33.5 g/dL (ref 30.0–36.0)
MCV: 87.8 fL (ref 80.0–100.0)
Platelets: 215 10*3/uL (ref 150–400)
RBC: 3.2 MIL/uL — ABNORMAL LOW (ref 3.87–5.11)
RDW: 14.8 % (ref 11.5–15.5)
WBC: 6.8 10*3/uL (ref 4.0–10.5)
nRBC: 0 % (ref 0.0–0.2)

## 2022-04-06 MED ORDER — DOCUSATE SODIUM 100 MG PO CAPS
100.0000 mg | ORAL_CAPSULE | Freq: Two times a day (BID) | ORAL | Status: DC
  Administered 2022-04-06 – 2022-04-09 (×6): 100 mg via ORAL
  Filled 2022-04-06 (×6): qty 1

## 2022-04-06 MED ORDER — POLYETHYLENE GLYCOL 3350 17 G PO PACK: 17.0000 g | PACK | Freq: Every day | ORAL | Status: DC | PRN

## 2022-04-06 NOTE — Progress Notes (Signed)
   Subjective/Chief Complaint: Doing better but still requiring pain meds and having fevers.     Objective: Vital signs in last 24 hours: Temp:  [97.9 F (36.6 C)-99.1 F (37.3 C)] 97.9 F (36.6 C) (10/02 0757) Pulse Rate:  [68-84] 78 (10/02 0757) Resp:  [16-18] 16 (10/02 0757) BP: (97-107)/(63-75) 97/63 (10/02 0757) SpO2:  [93 %-100 %] 94 % (10/02 0757) Last BM Date : 04/03/22  Intake/Output from previous day: 10/01 0701 - 10/02 0700 In: 220 [P.O.:220] Out: -  Intake/Output this shift: No intake/output data recorded.  Left breast wound clean erythema. Improved since Friday, but still red, indurated.  Lab Results:  Recent Labs    04/05/22 0433 04/06/22 0059  WBC 7.6 6.8  HGB 9.8* 9.4*  HCT 29.7* 28.1*  PLT 208 215   BMET Recent Labs    04/03/22 1821 04/04/22 0605  NA 137 142  K 3.3* 4.4  CL 108 112*  CO2 23 25  GLUCOSE 114* 119*  BUN 9 12  CREATININE 0.64 0.66  CALCIUM 8.4* 8.5*   PT/INR No results for input(s): "LABPROT", "INR" in the last 72 hours. ABG No results for input(s): "PHART", "HCO3" in the last 72 hours.  Invalid input(s): "PCO2", "PO2"  Studies/Results: No results found.  Anti-infectives: Anti-infectives (From admission, onward)    Start     Dose/Rate Route Frequency Ordered Stop   04/04/22 1000  hydroxychloroquine (PLAQUENIL) tablet 200 mg        200 mg Oral Daily 04/03/22 1921     04/04/22 0800  vancomycin (VANCOREADY) IVPB 1750 mg/350 mL        1,750 mg 175 mL/hr over 120 Minutes Intravenous Every 24 hours 04/03/22 2230     04/03/22 2230  ceFEPIme (MAXIPIME) 2 g in sodium chloride 0.9 % 100 mL IVPB        2 g 200 mL/hr over 30 Minutes Intravenous Every 8 hours 04/03/22 2230         Assessment/Plan: Left breast cellulitis post op Failed outpatient antibiotics.  Improved some on IV ABX  Hopefully home in next few days.     LOS: 3 days    Stark Klein MD  04/06/2022

## 2022-04-06 NOTE — TOC CM/SW Note (Signed)
  Transition of Care [facility]) Screening Note   Patient Details  Name: [name] Date of Birth: 1970/11/22   Transition of Care Department [facility]) has reviewed patient and no TOC needs have been identified at this time. We will continue to monitor patient advancement through interdisciplinary progression rounds. If new patient transition needs arise, please place a TOC consult.

## 2022-04-07 ENCOUNTER — Other Ambulatory Visit (HOSPITAL_COMMUNITY): Payer: Self-pay

## 2022-04-07 DIAGNOSIS — L0291 Cutaneous abscess, unspecified: Secondary | ICD-10-CM | POA: Diagnosis not present

## 2022-04-07 DIAGNOSIS — N61 Mastitis without abscess: Secondary | ICD-10-CM

## 2022-04-07 LAB — CBC
HCT: 31.3 % — ABNORMAL LOW (ref 36.0–46.0)
Hemoglobin: 10.3 g/dL — ABNORMAL LOW (ref 12.0–15.0)
MCH: 29.1 pg (ref 26.0–34.0)
MCHC: 32.9 g/dL (ref 30.0–36.0)
MCV: 88.4 fL (ref 80.0–100.0)
Platelets: 262 10*3/uL (ref 150–400)
RBC: 3.54 MIL/uL — ABNORMAL LOW (ref 3.87–5.11)
RDW: 14.7 % (ref 11.5–15.5)
WBC: 7.8 10*3/uL (ref 4.0–10.5)
nRBC: 0 % (ref 0.0–0.2)

## 2022-04-07 LAB — VANCOMYCIN, TROUGH: Vancomycin Tr: 5 ug/mL — ABNORMAL LOW (ref 15–20)

## 2022-04-07 MED ORDER — DOXYCYCLINE HYCLATE 100 MG PO TABS
100.0000 mg | ORAL_TABLET | Freq: Two times a day (BID) | ORAL | 0 refills | Status: AC
  Filled 2022-04-07: qty 56, 28d supply, fill #0

## 2022-04-07 MED ORDER — DOXYCYCLINE HYCLATE 100 MG PO TABS
100.0000 mg | ORAL_TABLET | Freq: Two times a day (BID) | ORAL | 0 refills | Status: DC
  Filled 2022-04-07: qty 56, 28d supply, fill #0

## 2022-04-07 MED ORDER — DOXYCYCLINE HYCLATE 100 MG PO TABS: 100.0000 mg | ORAL_TABLET | Freq: Two times a day (BID) | ORAL | Status: DC

## 2022-04-07 MED ORDER — AMOXICILLIN-POT CLAVULANATE 875-125 MG PO TABS: 1.0000 | ORAL_TABLET | Freq: Two times a day (BID) | ORAL | Status: DC

## 2022-04-07 MED ORDER — DOXYCYCLINE HYCLATE 100 MG PO TABS
100.0000 mg | ORAL_TABLET | Freq: Two times a day (BID) | ORAL | Status: DC
  Administered 2022-04-08: 100 mg via ORAL
  Filled 2022-04-07: qty 1

## 2022-04-07 MED ORDER — AMOXICILLIN-POT CLAVULANATE 875-125 MG PO TABS
1.0000 | ORAL_TABLET | Freq: Two times a day (BID) | ORAL | Status: DC
  Administered 2022-04-08: 1 via ORAL
  Filled 2022-04-07: qty 1

## 2022-04-07 MED ORDER — AMOXICILLIN-POT CLAVULANATE 875-125 MG PO TABS
1.0000 | ORAL_TABLET | Freq: Two times a day (BID) | ORAL | 0 refills | Status: DC
  Filled 2022-04-07: qty 56, 28d supply, fill #0

## 2022-04-07 NOTE — Progress Notes (Signed)
Mobility Specialist - Progress Note   04/07/22 1502  Mobility  Activity Ambulated independently in hallway  Activity Response Tolerated well  Distance Ambulated (ft) 550 ft  $Mobility charge 1 Mobility  Level of Assistance Independent  Assistive Device None    Pt received in bed agreeable to mobility. Left in bed w/ call bell in reach and all needs met.  Paulla Dolly Mobility Specialist

## 2022-04-07 NOTE — Progress Notes (Signed)
   Subjective/Chief Complaint: Patient still having pain and temp curve is higher than previous.     Objective: Vital signs in last 24 hours: Temp:  [97.8 F (36.6 C)-100.4 F (38 C)] 98.8 F (37.1 C) (10/03 0827) Pulse Rate:  [73-93] 87 (10/03 0827) Resp:  [16-18] 18 (10/03 0827) BP: (107-122)/(56-81) 121/81 (10/03 0827) SpO2:  [93 %-98 %] 96 % (10/03 0827) Last BM Date : 04/06/22  Intake/Output from previous day: 10/02 0701 - 10/03 0700 In: 300.1 [IV Piggyback:300.1] Out: -  Intake/Output this shift: No intake/output data recorded.  Left breast wound clean, still with significant erythema and induration   Lab Results:  Recent Labs    04/06/22 0059 04/07/22 0540  WBC 6.8 7.8  HGB 9.4* 10.3*  HCT 28.1* 31.3*  PLT 215 262   BMET No results for input(s): "NA", "K", "CL", "CO2", "GLUCOSE", "BUN", "CREATININE", "CALCIUM" in the last 72 hours.  PT/INR No results for input(s): "LABPROT", "INR" in the last 72 hours. ABG No results for input(s): "PHART", "HCO3" in the last 72 hours.  Invalid input(s): "PCO2", "PO2"  Studies/Results: No results found.  Anti-infectives: Anti-infectives (From admission, onward)    Start     Dose/Rate Route Frequency Ordered Stop   04/08/22 1000  doxycycline (VIBRA-TABS) tablet 100 mg  Status:  Discontinued        100 mg Oral Every 12 hours 04/07/22 1056 04/07/22 1115   04/08/22 1000  doxycycline (VIBRA-TABS) tablet 100 mg        100 mg Oral Every 12 hours 04/07/22 1103 05/06/22 0959   04/08/22 1000  amoxicillin-clavulanate (AUGMENTIN) 875-125 MG per tablet 1 tablet        1 tablet Oral Every 12 hours 04/07/22 1103 05/06/22 0959   04/08/22 0000  doxycycline (VIBRA-TABS) 100 MG tablet  Status:  Discontinued        100 mg Oral Every 12 hours 04/07/22 1114 04/07/22    04/08/22 0000  doxycycline (VIBRA-TABS) 100 MG tablet        100 mg Oral Every 12 hours 04/07/22 1116 05/06/22 2359   04/07/22 1800  amoxicillin-clavulanate  (AUGMENTIN) 875-125 MG per tablet 1 tablet  Status:  Discontinued        1 tablet Oral Every 12 hours 04/07/22 1056 04/07/22 1115   04/07/22 0000  amoxicillin-clavulanate (AUGMENTIN) 875-125 MG tablet        1 tablet Oral Every 12 hours 04/07/22 1124     04/04/22 1000  hydroxychloroquine (PLAQUENIL) tablet 200 mg        200 mg Oral Daily 04/03/22 1921     04/04/22 0800  vancomycin (VANCOREADY) IVPB 1750 mg/350 mL  Status:  Discontinued        1,750 mg 175 mL/hr over 120 Minutes Intravenous Every 24 hours 04/03/22 2230 04/07/22 1056   04/03/22 2230  ceFEPIme (MAXIPIME) 2 g in sodium chloride 0.9 % 100 mL IVPB  Status:  Discontinued        2 g 200 mL/hr over 30 Minutes Intravenous Every 8 hours 04/03/22 2230 04/07/22 1056       Assessment/Plan: Left breast cellulitis post op  Failed outpatient antibiotics.  Improved some on IV ABX (ceftriaxone and vanc)  Outpatient culture [facility] 9/29 in media section - heavy MRSA growth.   Getting ID consult.      LOS: 4 days    Stark Klein MD  04/07/2022

## 2022-04-07 NOTE — TOC Transition Note (Signed)
2 discharge meds for patient in the [facility] until discharge.

## 2022-04-07 NOTE — Consult Note (Signed)
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   She reported the pus/pain appears to have some improvement with abx but otherwise the wound really never healed since 11/2021  She came in with sepsis 09/29. The clinic wound cx showed mrsa  (S doxy). The previous wound cx 9/20 showed polymicrobial process including gnr anaerobes, strep angi, h parainfluenza.  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 Also goes same for her Wegner's. Although there is no granuloma on recent path, I do not think that will r/o vasculitis involvement of the skin   At this time will continue to treat for what appears to be some role of superimposed infection with doxy/augmentin, but I also advise her to discuss with rheum about the possibility of connective tissue involvement. Long-term wise, she might need wound care/hyperbaric for potentially radiation dermatitis/chronic wound    Plan: Doxy/augmentin today; plan 4 weeks Will see in id clinic in around 2-3 weeks to reassess Consider wound care referal Advise patient to discuss case with rheum regarding her wegner's contributing to the wound If continued improvement can discharge tomorrow from our standpoint Discussed with primary  team    Clinic Follow Up Appt: 10/20 @ 945  @  [facility] [place] [address], [place], [place] [address] Phone: [phone]   I spent 75 minute reviewing data/chart, and coordinating care and >50% direct face to face time providing counseling/discussing diagnostics/treatment plan with patient    ------------------------------------------------ Principal Problem:   Breast infection    HPI: [name] is a 51 y.o. 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 She has been on several courses of abx: 3 weeks doxy in 11/2021 and recently 4 weeks amox-clav  Improved discharge and pain but never resolution of sx.  On 9/20 has a resection of infected tissue -- path reviewed -- polymicrobial  On 9/29 seen in clinic for fever -- a swab there showed mrsa from wound tissue  She was admitted for wound care has local packing changed and iv abx vanc/cefepime with improved discharge  No abx side effect    Family History  Problem Relation Age of Onset   Asthma Mother    Depression Mother    Diabetes Mother    Hyperlipidemia Mother    Hypertension Mother    Mental illness Mother    Arthritis Mother    Pancreatic cancer Mother  Heart disease Father 67       AMI/CABG   Mental illness Sister    Alzheimer's disease Maternal Grandmother    Hyperlipidemia Maternal Grandmother    Hypertension Maternal Grandmother    Heart attack Maternal Grandfather    Lung cancer Maternal Grandfather    Myasthenia gravis Daughter     Charcot-Marie-Tooth disease Daughter    ADD / ADHD Daughter    Pancreatic cancer Maternal Aunt     Social History   Tobacco Use   Smoking status: Never   Smokeless tobacco: Never  Vaping Use   Vaping Use: Never used  Substance Use Topics   Alcohol use: Yes    Comment: occasional   Drug use: Never    Allergies  Allergen Reactions   Kiwi Extract Shortness Of Breath and Swelling   Ativan [Lorazepam] Other (See Comments)    Hyperactivity per patient   Erythromycin Base Nausea And Vomiting   Nitrofurantoin Nausea And Vomiting   Other     Latex self dissolving sutures "work their way out"   Xanax [Alprazolam] Other (See Comments)    Makes patient hyperactive     Review of Systems: ROS All Other ROS was negative, except mentioned above   Past Medical History:  Diagnosis Date   Asthma    followed by pcp---  pt stated mild seasonal asthma   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   Female cystocele    symptomatic   GAD (generalized anxiety disorder)    GERD (gastroesophageal reflux disease)    Hiatal hernia    History of 2019 novel coronavirus disease (COVID-19) 07/2020   per pt had mild symptoms that resolved   MDD (major depressive disorder)    Mixed incontinence urge and stress    Wears glasses    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[place])    [facility]---  dx 2015,  takes methotrexate wkly, pt stated she is stable       Scheduled Meds:  [START ON 04/08/2022] amoxicillin-clavulanate  1 tablet Oral Q12H   docusate sodium  100 mg Oral BID   [START ON 04/08/2022] doxycycline  100 mg Oral Q12H   enoxaparin (LOVENOX) injection  40 mg Subcutaneous Daily   gabapentin  300 mg Oral QHS   hydroxychloroquine  200 mg Oral Daily   letrozole  2.5 mg Oral Daily   pantoprazole  40 mg Oral Daily   sertraline  100 mg Oral Daily   sodium chloride flush  3 mL Intravenous Q12H   Continuous Infusions:  sodium chloride     PRN  Meds:.sodium chloride, HYDROcodone-acetaminophen, hydrOXYzine, ondansetron **OR** ondansetron (ZOFRAN) IV, polyethylene glycol, sodium chloride flush   OBJECTIVE: Blood pressure 121/81, pulse 87, temperature 98.8 F (37.1 C), temperature source Oral, resp. rate 18, height '5\' 3"'$  (1.6 m), weight 79.3 kg, SpO2 96 %.  Physical Exam  General/constitutional: no distress, pleasant HEENT: Normocephalic, PER, Conj Clear, EOMI, Oropharynx clear Neck supple CV: rrr no mrg Lungs: clear to auscultation, normal respiratory effort Abd: Soft, Nontender Ext: no edema Skin: incision with packing; slight erythema surrounding Neuro: nonfocal MSK: no peripheral joint swelling/tenderness/warmth; back spines nontender   Lab Results Lab Results  Component Value Date   WBC 7.8 04/07/2022   HGB 10.3 (L) 04/07/2022   HCT 31.3 (L) 04/07/2022   MCV 88.4 04/07/2022   PLT 262 04/07/2022    Lab Results  Component Value Date   CREATININE 0.66 04/04/2022   BUN 12 04/04/2022  NA 142 04/04/2022   K 4.4 04/04/2022   CL 112 (H) 04/04/2022   CO2 25 04/04/2022    Lab Results  Component Value Date   ALT 8 04/04/2022   AST 14 (L) 04/04/2022   ALKPHOS 50 04/04/2022   BILITOT 0.4 04/04/2022      Microbiology: Recent Results (from the past 240 hour(s))  Culture, blood (routine x 2)     Status: None (Preliminary result)   Collection Time: 04/03/22  6:28 PM   Specimen: BLOOD  Result Value Ref Range Status   Specimen Description BLOOD BLOOD RIGHT FOREARM  Final   Special Requests   Final    BOTTLES DRAWN AEROBIC AND ANAEROBIC Blood Culture adequate volume   Culture   Final    NO GROWTH 4 DAYS Performed at [facility], [place] [address], [place], [place] [address]    Report Status PENDING  Incomplete  Culture, blood (routine x 2)     Status: None (Preliminary result)   Collection Time: 04/03/22  6:28 PM   Specimen: BLOOD RIGHT HAND  Result Value Ref Range Status   Specimen Description BLOOD  RIGHT HAND  Final   Special Requests   Final    BOTTLES DRAWN AEROBIC ONLY Blood Culture results may not be optimal due to an inadequate volume of blood received in culture bottles   Culture   Final    NO GROWTH 4 DAYS Performed at [facility], [place] [address] [place], [place] [address]    Report Status PENDING  Incomplete     Serology:  Pathology: 9/20 left breast abscess resection A. LEFT BREAST WOUND, EXCISION:  Abscess cavity with adjacent dense fibrotic hyalinized stroma and focal  fat necrosis and foreign body type giant cell reaction  Focal changes suggestive of a ruptured epidermal inclusion cyst  Negative for malignancy   Imaging: If present, new imagings (plain films, ct scans, and mri) have been personally visualized and interpreted; radiology reports have been reviewed. Decision making incorporated into the Impression / Recommendations.  9/07 left breast u/s 1. Bilateral diagnostic mammogram with LEFT and RIGHT ultrasound as deemed necessary in 6 months 2. Attempt at ultrasound-guided aspiration will be performed today. Recommend consideration of wound care consultation for chronic wound care.   I have discussed the findings and recommendations with the patient. If applicable, a reminder letter will be sent to the patient regarding the next appointment.   BI-RADS CATEGORY  3: Probably benign.  Jabier Mutton, [facility] [place] [phone] pager    04/07/2022, 12:09 PM

## 2022-04-07 NOTE — Plan of Care (Signed)
  Problem: Clinical Measurements: Goal: Will remain free from infection Outcome: Not Progressing   Problem: Pain Managment: Goal: General experience of comfort will improve Outcome: Not Progressing   Problem: Safety: Goal: Ability to remain free from injury will improve Outcome: Not Progressing   Problem: Skin Integrity: Goal: Risk for impaired skin integrity will decrease Outcome: Not Progressing

## 2022-04-08 LAB — CULTURE, BLOOD (ROUTINE X 2)
Culture: NO GROWTH
Culture: NO GROWTH
Special Requests: ADEQUATE

## 2022-04-08 LAB — CBC
HCT: 30 % — ABNORMAL LOW (ref 36.0–46.0)
Hemoglobin: 9.8 g/dL — ABNORMAL LOW (ref 12.0–15.0)
MCH: 28.9 pg (ref 26.0–34.0)
MCHC: 32.7 g/dL (ref 30.0–36.0)
MCV: 88.5 fL (ref 80.0–100.0)
Platelets: 248 10*3/uL (ref 150–400)
RBC: 3.39 MIL/uL — ABNORMAL LOW (ref 3.87–5.11)
RDW: 14.7 % (ref 11.5–15.5)
WBC: 8.1 10*3/uL (ref 4.0–10.5)
nRBC: 0 % (ref 0.0–0.2)

## 2022-04-08 MED ORDER — MEDIHONEY WOUND/BURN DRESSING EX PSTE
1.0000 | PASTE | Freq: Every day | CUTANEOUS | Status: DC
  Administered 2022-04-08 – 2022-04-09 (×2): 1 via TOPICAL
  Filled 2022-04-08: qty 88

## 2022-04-08 MED ORDER — VANCOMYCIN HCL 1750 MG/350ML IV SOLN
1750.0000 mg | INTRAVENOUS | Status: DC
  Administered 2022-04-08 – 2022-04-09 (×2): 1750 mg via INTRAVENOUS
  Filled 2022-04-08 (×2): qty 350

## 2022-04-08 MED ORDER — SODIUM CHLORIDE 0.9 % IV SOLN
2.0000 g | INTRAVENOUS | Status: DC
  Administered 2022-04-08: 2 g via INTRAVENOUS
  Filled 2022-04-08: qty 20

## 2022-04-08 NOTE — Progress Notes (Signed)
Subjective/Chief Complaint: Pt did not get any oral antibiotics until this AM.  Still having low grade temps and pain requiring narcotics.  Misunderstanding with WOCN consult occurred so they did not see yesterday.    Objective: Vital signs in last 24 hours: Temp:  [98.6 F (37 C)-99.2 F (37.3 C)] 99.2 F (37.3 C) (10/04 0759) Pulse Rate:  [77-89] 81 (10/04 0759) Resp:  [17-18] 18 (10/04 0759) BP: (108-118)/(65-75) 118/73 (10/04 0759) SpO2:  [95 %-100 %] 95 % (10/04 0759) Last BM Date : 04/07/22  Intake/Output from previous day: 10/03 0701 - 10/04 0700 In: 660 [P.O.:660] Out: 6 [Urine:6] Intake/Output this shift: No intake/output data recorded.  Left breast wound slightly less red today, still very indurated.  Some fibrinous exudate at base.     Lab Results:  Recent Labs    04/07/22 0540 04/08/22 0251  WBC 7.8 8.1  HGB 10.3* 9.8*  HCT 31.3* 30.0*  PLT 262 248   BMET No results for input(s): "NA", "K", "CL", "CO2", "GLUCOSE", "BUN", "CREATININE", "CALCIUM" in the last 72 hours.  PT/INR No results for input(s): "LABPROT", "INR" in the last 72 hours. ABG No results for input(s): "PHART", "HCO3" in the last 72 hours.  Invalid input(s): "PCO2", "PO2"  Studies/Results: No results found.  Anti-infectives: Anti-infectives (From admission, onward)    Start     Dose/Rate Route Frequency Ordered Stop   04/08/22 1230  cefTRIAXone (ROCEPHIN) 2 g in sodium chloride 0.9 % 100 mL IVPB       Note to Pharmacy: Pharmacy may adjust dosing strength / duration / interval for maximal efficacy   2 g 200 mL/hr over 30 Minutes Intravenous Every 24 hours 04/08/22 1141     04/08/22 1000  doxycycline (VIBRA-TABS) tablet 100 mg  Status:  Discontinued        100 mg Oral Every 12 hours 04/07/22 1056 04/07/22 1115   04/08/22 1000  doxycycline (VIBRA-TABS) tablet 100 mg  Status:  Discontinued        100 mg Oral Every 12 hours 04/07/22 1103 04/08/22 1141   04/08/22 1000   amoxicillin-clavulanate (AUGMENTIN) 875-125 MG per tablet 1 tablet  Status:  Discontinued        1 tablet Oral Every 12 hours 04/07/22 1103 04/08/22 1141   04/08/22 0000  doxycycline (VIBRA-TABS) 100 MG tablet  Status:  Discontinued        100 mg Oral Every 12 hours 04/07/22 1114 04/07/22    04/08/22 0000  doxycycline (VIBRA-TABS) 100 MG tablet        100 mg Oral Every 12 hours 04/07/22 1116 05/06/22 2359   04/07/22 1800  amoxicillin-clavulanate (AUGMENTIN) 875-125 MG per tablet 1 tablet  Status:  Discontinued        1 tablet Oral Every 12 hours 04/07/22 1056 04/07/22 1115   04/07/22 0000  amoxicillin-clavulanate (AUGMENTIN) 875-125 MG tablet        1 tablet Oral Every 12 hours 04/07/22 1124     04/04/22 1000  hydroxychloroquine (PLAQUENIL) tablet 200 mg        200 mg Oral Daily 04/03/22 1921     04/04/22 0800  vancomycin (VANCOREADY) IVPB 1750 mg/350 mL  Status:  Discontinued        1,750 mg 175 mL/hr over 120 Minutes Intravenous Every 24 hours 04/03/22 2230 04/07/22 1056   04/03/22 2230  ceFEPIme (MAXIPIME) 2 g in sodium chloride 0.9 % 100 mL IVPB  Status:  Discontinued        2  g 200 mL/hr over 30 Minutes Intravenous Every 8 hours 04/03/22 2230 04/07/22 1056       Assessment/Plan: Left breast cellulitis post op  Failed outpatient antibiotics. Improved some on IV ABX (ceftriaxone and vanc)  [facility]patient culture [facility] 9/29 in media section - heavy MRSA growth.  Previous culture from wound showed multiple different bacteria.  ID recommends oral augmentin/doxycycline.  She just started that this AM, but has finally started to show mild improvement on IV antibiotics.  Will continue IV for another day.    Will see if WOCN has other input other than wet to dry which hasn't been successful for her in the past. ? Wound vac or hyperbaric oxygen or other recommendations.    I discussed patient with Dr. Posey Pronto, her rheumatologist from [facility] clinic in [place] and we will hold  methotrexate.  Patients can get vasculitis in skin with wegener's granulomatosis, but there was no evidence of this present in path specimen from several weeks ago.      LOS: 5 days    Stark Klein MD  04/08/2022

## 2022-04-08 NOTE — Consult Note (Addendum)
[facility] Nurse Consult Note: Reason for Consult: Consult requested for left breast.  Surgical team is following for assessment and plan of care. Requested to provide topical treatment recommendations.  Wound type: Left outer breast with full thickness post-op wound; 50% red, 50% yellow slough, mod amt tan drainage, red induration surrounding the location. 2X6X1cm, loose sutures in place at wound edges.  Dressing procedure/placement/frequency: Pt could benefit from follow-up at the outpatient wound care center.  This must be by physician referral. Pt also asking if she would be able to return to work after discharge.  Topical treatment orders provided for bedside nurses to perform as follows to assist with removal of nonviable tissue and provide antimicrobial benefits: Apply Medihoney to left breast wound Q day, then cover with fluffed 2X2, using swab to fill.  Cover with foam dressing and change foam dressing Q 3 days or PRN soiling. Demonstrated dressing change process using a hand held mirror and the patient verbalized understanding.  Please refer to the surgical team for further questions regarding the plan of care. Please re-consult if further assistance is needed.  Thank-you,  Julien Girt MSN, [name], [place], [place], [place]

## 2022-04-08 NOTE — Progress Notes (Signed)
Pharmacy Antibiotic Note  [name] is a 51 y.o. female admitted on 04/03/2022 with L breast cellulitis.  Pharmacy has been consulted for Vancomycin dosing.  Patient had been maintained on vancomycin and cefepime and changed to oral Augmentin and doxycycline yesterday. Patient still has low grade temperatures this morning - Surgery would like at least another day of IV antibiotics before discharging.  Renal function is stable.  Plan: Restart vancomycin '1750mg'$  Q24H Continue ceftriaxone 2g Q24H  Follow for changing to oral regimen of Augmentin and doxycycline through 10/31 per ID recommendations.  Height: '5\' 3"'$  (160 cm) Weight: 79.3 kg (174 lb 13.2 oz) IBW/kg (Calculated) : 52.4  Temp (24hrs), Avg:99 F (37.2 C), Min:98.6 F (37 C), Max:99.2 F (37.3 C)  Recent Labs  Lab 04/03/22 1821 04/04/22 0605 04/05/22 0433 04/06/22 0059 04/07/22 0540 04/08/22 0251  WBC 11.0* 8.8 7.6 6.8 7.8 8.1  CREATININE 0.64 0.66  --   --   --   --   LATICACIDVEN 0.9  --   --   --   --   --   VANCOTROUGH  --   --   --   --  5*  --      Estimated Creatinine Clearance: 82.1 mL/min (by C-G formula based on SCr of 0.66 mg/dL).    Allergies  Allergen Reactions   Kiwi Extract Shortness Of Breath and Swelling   Ativan [Lorazepam] Other (See Comments)    Hyperactivity per patient   Erythromycin Base Nausea And Vomiting   Nitrofurantoin Nausea And Vomiting   Other     Latex self dissolving sutures "work their way out"   Xanax [Alprazolam] Other (See Comments)    Makes patient hyperactive     Thank you for allowing pharmacy to be a part of this patient's care.  Erskine Speed, PharmD Clinical Pharmacist 04/08/2022 12:00 PM   Please refer to [facility] for pharmacy phone number

## 2022-04-09 ENCOUNTER — Other Ambulatory Visit (HOSPITAL_COMMUNITY): Payer: Self-pay

## 2022-04-09 ENCOUNTER — Encounter (HOSPITAL_COMMUNITY): Payer: Self-pay | Admitting: *Deleted

## 2022-04-09 MED ORDER — MEDIHONEY WOUND/BURN DRESSING EX PSTE
1.0000 | PASTE | Freq: Every day | CUTANEOUS | 1 refills | Status: DC
  Filled 2022-04-09 – 2022-07-03 (×3): qty 44, 44d supply, fill #0

## 2022-04-09 MED ORDER — AMOXICILLIN-POT CLAVULANATE 875-125 MG PO TABS: 1.0000 | ORAL_TABLET | Freq: Two times a day (BID) | ORAL | Status: DC

## 2022-04-09 MED ORDER — HYDROCODONE-ACETAMINOPHEN 5-325 MG PO TABS
1.0000 | ORAL_TABLET | ORAL | 0 refills | Status: DC | PRN
  Filled 2022-04-09: qty 30, 3d supply, fill #0

## 2022-04-09 MED ORDER — SODIUM CHLORIDE 0.9 % IV SOLN
2.0000 g | Freq: Once | INTRAVENOUS | Status: AC
  Administered 2022-04-09: 2 g via INTRAVENOUS
  Filled 2022-04-09: qty 20

## 2022-04-09 MED ORDER — SODIUM CHLORIDE 0.9 % IV SOLN: 2.0000 g | Freq: Once | INTRAVENOUS | Status: DC

## 2022-04-09 MED ORDER — ONDANSETRON 4 MG PO TBDP
4.0000 mg | ORAL_TABLET | Freq: Four times a day (QID) | ORAL | 0 refills | Status: DC | PRN
  Filled 2022-04-09: qty 20, 5d supply, fill #0

## 2022-04-09 NOTE — TOC Transition Note (Signed)
5 discharge meds in the [facility] pharmacy for patient until discharge.

## 2022-04-09 NOTE — Discharge Summary (Signed)
Physician Discharge Summary  Patient ID: [name] MRN: [id number] DOB/AGE: 52-Oct-1972 52 y.o.  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  This was delayed due to bed availability, so she went to [facility] ED first.  She was started on IV antibiotics and had wet to dry wound care.  She was on ceftriaxone and vanc and had minimal improvement.  Operative cultures showed strep and prevotella, and culture from clinic showed MRSA.  ID was consulted and recommended 4 weeks of augmentin and doxycycline.  However, she was just starting to improve on the oral antibiotics and remained with low grade temps, so an additional day of IV meds were given.  She was requiring pain meds due to the significance of the cellulitis and the wound care.  WOCN evaluated the patient and recommended medihoney for wound care.  This was taught to the patient.    Of note the patient has wegener's granulomatosis and has been on methotrexate.  I discussed this with her outpatient rheumatologist (Dr. Posey Pronto) at [facility] clinic, and he recommended holding the methotrexate for now.    Consults: ID and WOCN  Significant Diagnostic Studies: labs: WBcs prior to d/c were 8.1k  Treatments: antibiotics and wound care as described above.  Discharge Exam: Blood pressure 113/70, pulse 82, temperature 99.1 F (37.3 C), temperature source Oral, resp. rate 18,  height '5\' 3"'$  (1.6 m), weight 79.3 kg, SpO2 99 %. General appearance: alert, cooperative, and discomfort visible Resp: breathing comfortably Breasts: left breast cellulitis improved significantly.  Pinkness gone, still indurated Extremities: extremities normal, atraumatic, no cyanosis or edema  Disposition:  There are no questions and answers to display.        Allergies as of 04/09/2022       Reactions   Kiwi Extract Shortness Of Breath, Swelling   Ativan [lorazepam] Other (See Comments)   Hyperactivity per patient   Erythromycin Base Nausea And Vomiting   Nitrofurantoin Nausea And Vomiting   Other    Latex self dissolving sutures "work their way out"   Xanax [alprazolam] Other (See Comments)   Makes patient hyperactive         Medication List     STOP taking these medications    gabapentin 300 MG capsule Commonly known as: NEURONTIN   levofloxacin 750 MG tablet Commonly known as: LEVAQUIN   Medihoney Wound/Burn Dressing Gel Replaced by: leptospermum manuka honey Pste paste   methotrexate 2.5 MG tablet       TAKE these medications    Advair Diskus 100-50 MCG/ACT Aepb Generic drug: fluticasone-salmeterol INHALE 1 PUFF INTO THE LUNGS TWICE DAILY   albuterol 108 (90 Base) MCG/ACT inhaler Commonly known as: VENTOLIN HFA Inhale 2 puffs into the lungs every 6 (six) hours as needed for wheezing or shortness of breath (cough, shortness of breath or wheezing.).   amoxicillin-clavulanate 875-125 MG tablet Commonly known as: AUGMENTIN Take 1 tablet by mouth every 12 (twelve) hours. Take 1 tablet every 12 hours for 4 weeks What changed: additional instructions   doxycycline 100 MG  tablet Commonly known as: VIBRA-TABS Take 1 tablet (100 mg total) by mouth every 12 (twelve) hours for 28 days.   folic acid 1 MG tablet Commonly known as: FOLVITE Take 1 tablet (1 mg total) by mouth once daily What changed:  how much to take how to take this when to take this    HYDROcodone-acetaminophen 5-325 MG tablet Commonly known as: NORCO/VICODIN Take 1-2 tablets by mouth every 4 (four) hours as needed for moderate pain or severe pain. What changed: Another medication with the same name was removed. Continue taking this medication, and follow the directions you see here.   hydroxychloroquine 200 MG tablet Commonly known as: PLAQUENIL Take 1 tablet (200 mg total) by mouth 2 (two) times daily What changed:  when to take this Another medication with the same name was removed. Continue taking this medication, and follow the directions you see here.   hydrOXYzine 25 MG capsule Commonly known as: VISTARIL Take 1 capsule (25 mg total) by mouth daily as needed. What changed:  how much to take when to take this   ibuprofen 800 MG tablet Commonly known as: ADVIL Take 1 tablet (800 mg total) by mouth every 8 (eight) hours as needed. What changed: reasons to take this   leptospermum manuka honey Pste paste Apply 1 Application topically daily. Replaces: Medihoney Wound/Burn Dressing Gel   letrozole 2.5 MG tablet Commonly known as: FEMARA Take 1 tablet (2.5 mg total) by mouth daily.   lidocaine-nystatin-hydrocortisone-diphenhydrAMINE Swish and spit 5 mLs by mouth every 6 (six) hours as needed   montelukast 10 MG tablet Commonly known as: SINGULAIR Take 1 tablet (10 mg total) by mouth daily.   ondansetron 4 MG disintegrating tablet Commonly known as: ZOFRAN-ODT Take 1 tablet (4 mg total) by mouth every 6 (six) hours as needed for nausea.   pantoprazole 40 MG tablet Commonly known as: PROTONIX TAKE ONE TABLET BY MOUTH TWICE A DAY BEFORE FOOD What changed:  how much to take when to take this   sertraline 100 MG tablet Commonly known as: ZOLOFT Take 1.5 tablets (150 mg total) by mouth daily. What changed:  how much to take how to take this when to take this   sucralfate 1 g tablet Commonly known as: CARAFATE Take 1 g by mouth 2 (two) times  daily as needed (Antacid).        Follow-up Information     Stark Klein, MD Follow up in 1 week(s).   Specialty: General Surgery Contact information: [address] [place] [address] [phone]                 Signed: Stark Klein 04/09/2022, 4:59 AM

## 2022-04-09 NOTE — Progress Notes (Signed)
[name] to be D/C'd  per MD order.  Discussed with the patient and all questions fully answered.  VSS, Skin clean, dry and intact without evidence of skin break down, no evidence of skin tears noted.  IV catheter discontinued intact. Site without signs and symptoms of complications. Dressing and pressure applied.  An After Visit Summary was printed and given to the patient. Patient received prescription from [place]. Patient receive dressing supplies and re-educated about how to clean incision.  D/c re-educated completed with patient/family including follow up instructions, medication list, d/c activities limitations if indicated, with other d/c instructions as indicated by MD - patient able to verbalize understanding, all questions fully answered.   Patient instructed to return to ED, call 911, or call MD for any changes in condition.   Patient to be escorted via [place], and D/C home via private auto.

## 2022-04-09 NOTE — TOC Initial Note (Signed)
Transition of Care [facility]) - Initial/Assessment Note    Patient Details  Name: [name] MRN: [id number] Date of Birth: 02/23/1971  Transition of Care [facility]) CM/SW Contact:    Marilu Favre, RN Phone Number: 04/09/2022, 10:07 AM  Clinical Narrative:                 Spoke to patient at bedside. Confirmed face sheet information.   Patient in agreement to follow up at [facility]. Scheduled appointment April 20, 2022 at 0900 am. Information placed on AVS , patient and Dr Barry Dienes aware.   Ordered dressing supplies with Lacres[place] with [facility]. Nursing will provide wound education and some supplies.   Expected Discharge Plan: Home/Self Care Barriers to Discharge: No Barriers Identified   Patient Goals and CMS Choice Patient states their goals for this hospitalization and ongoing recovery are:: to return home CMS Medicare.gov Compare Post Acute Care list provided to:: Patient    Expected Discharge Plan and Services Expected Discharge Plan: Home/Self Care   Discharge Planning Services: CM Consult Post Acute Care Choice: Durable Medical Equipment (dressing supplies) Living arrangements for the past 2 months: Single Family Home Expected Discharge Date: 04/09/22               DME Arranged: Other see comment (dressing supplies) DME Agency: [employer] Date DME Agency Contacted: 04/09/22 Time DME Agency Contacted: 18 Representative spoke with at DME Agency: [place]: NA          Prior Living Arrangements/Services Living arrangements for the past 2 months: Single Family Home Lives with:: Significant Other Patient language and need for interpreter reviewed:: Yes Do you feel safe going back to the place where you live?: Yes      Need for Family Participation in Patient Care: Yes (Comment) Care giver support system in place?: Yes (comment)   Criminal Activity/Legal Involvement Pertinent to Current Situation/Hospitalization: No - Comment as  needed  Activities of Daily Living Home Assistive Devices/Equipment: None ADL Screening (condition at time of admission) Patient's cognitive ability adequate to safely complete daily activities?: No Is the patient deaf or have difficulty hearing?: Yes Does the patient have difficulty seeing, even when wearing glasses/contacts?: Yes Does the patient have difficulty concentrating, remembering, or making decisions?: Yes Patient able to express need for assistance with ADLs?: Yes Does the patient have difficulty dressing or bathing?: No Independently performs ADLs?: Yes (appropriate for developmental age) Does the patient have difficulty walking or climbing stairs?: No Weakness of Legs: None Weakness of Arms/Hands: None  Permission Sought/Granted   Permission granted to share information with : No              Emotional Assessment Appearance:: Appears stated age Attitude/Demeanor/Rapport: Engaged Affect (typically observed): Accepting Orientation: : Oriented to Self, Oriented to Place, Oriented to  Time, Oriented to Situation Alcohol / Substance Use: Not Applicable Psych Involvement: No (comment)  Admission diagnosis:  Breast infection [N61.0] Cellulitis of left breast [N61.0] Patient Active Problem List   Diagnosis Date Noted   Abscess    Cellulitis of left breast 04/03/2022   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  09/13/2020   Cervix prolapsed into vagina 09/13/2020   Dysuria 09/13/2020   Genetic testing 09/10/2020   Anxiety 06/18/2020   Mild intermittent asthma without complication 86/76/1950   Bloody diarrhea 06/09/2016   Major depressive disorder,  recurrent, severe without psychotic features ([place])    Severe recurrent major depression w/psychotic features, mood-congruent ([place]) 05/04/2014   Major depression 05/04/2014   Positive ANA (antinuclear  antibody) 04/10/2014   Granulomatosis with polyangiitis with pulmonary involvement ([place]) 02/07/2014   Generalized anxiety disorder 02/07/2014   Insomnia 02/07/2014   Sensorineural hearing loss of right ear 12/06/2013   Dermatitis 12/05/2013   Nasal septal perforation 12/05/2013   Allergic rhinitis 10/12/2006   Asthma 10/12/2006   GERD 10/12/2006   PCP:  Mckinley Jewel, MD Pharmacy:   [place] [address] Phone: [phone] Fax: [place] [address] Phone: [phone] Fax: [phone]  [facility] [address] [place] [address] Phone: [phone] Fax: [place] [place] [address] [place] [address] Phone: [phone] Fax: [phone]  [facility] [address] [place] [address] Phone: [phone] Fax: [phone]     Social Determinants of Health (SDOH) Interventions    Readmission Risk Interventions     No data to display

## 2022-04-09 NOTE — Progress Notes (Signed)
Provided wound care post shower. Pt tolerated procedure well. Filled wound with medihoney, fluffed 2x2 over wound and covered with foam dressing. No S/S of infection or foul odor. Louanne Skye 6:42 AM 04/09/22

## 2022-04-09 NOTE — Discharge Instructions (Addendum)
Wound care:  Apply Medihoney to left breast wound Q day, then cover with fluffed 2X2, using swab to fill.  Cover with foam dressing and change foam dressing Q 3 days or PRN soiling.  Ok to shower.

## 2022-04-10 ENCOUNTER — Other Ambulatory Visit (HOSPITAL_COMMUNITY): Payer: Self-pay

## 2022-04-10 DIAGNOSIS — L03313 Cellulitis of chest wall: Secondary | ICD-10-CM | POA: Diagnosis not present

## 2022-04-20 ENCOUNTER — Encounter (HOSPITAL_BASED_OUTPATIENT_CLINIC_OR_DEPARTMENT_OTHER): Payer: 59 | Admitting: General Surgery

## 2022-04-24 ENCOUNTER — Ambulatory Visit (INDEPENDENT_AMBULATORY_CARE_PROVIDER_SITE_OTHER): Payer: 59 | Admitting: Internal Medicine

## 2022-04-24 ENCOUNTER — Other Ambulatory Visit: Payer: Self-pay

## 2022-04-24 ENCOUNTER — Encounter: Payer: Self-pay | Admitting: Internal Medicine

## 2022-04-24 ENCOUNTER — Other Ambulatory Visit (HOSPITAL_COMMUNITY): Payer: Self-pay

## 2022-04-24 ENCOUNTER — Encounter (HOSPITAL_BASED_OUTPATIENT_CLINIC_OR_DEPARTMENT_OTHER): Payer: 59 | Attending: General Surgery | Admitting: Internal Medicine

## 2022-04-24 VITALS — BP 130/86 | HR 64 | Temp 98.4°F | Ht 63.0 in | Wt 175.0 lb

## 2022-04-24 DIAGNOSIS — L98492 Non-pressure chronic ulcer of skin of other sites with fat layer exposed: Secondary | ICD-10-CM | POA: Diagnosis not present

## 2022-04-24 DIAGNOSIS — L089 Local infection of the skin and subcutaneous tissue, unspecified: Secondary | ICD-10-CM | POA: Diagnosis not present

## 2022-04-24 DIAGNOSIS — D0512 Intraductal carcinoma in situ of left breast: Secondary | ICD-10-CM | POA: Diagnosis not present

## 2022-04-24 DIAGNOSIS — J45909 Unspecified asthma, uncomplicated: Secondary | ICD-10-CM | POA: Insufficient documentation

## 2022-04-24 DIAGNOSIS — C50912 Malignant neoplasm of unspecified site of left female breast: Secondary | ICD-10-CM | POA: Diagnosis not present

## 2022-04-24 DIAGNOSIS — L581 Chronic radiodermatitis: Secondary | ICD-10-CM

## 2022-04-24 DIAGNOSIS — M313 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: Secondary | ICD-10-CM | POA: Diagnosis not present

## 2022-04-24 DIAGNOSIS — C50812 Malignant neoplasm of overlapping sites of left female breast: Secondary | ICD-10-CM | POA: Diagnosis not present

## 2022-04-24 DIAGNOSIS — T8131XA Disruption of external operation (surgical) wound, not elsewhere classified, initial encounter: Secondary | ICD-10-CM | POA: Insufficient documentation

## 2022-04-24 DIAGNOSIS — X58XXXA Exposure to other specified factors, initial encounter: Secondary | ICD-10-CM | POA: Insufficient documentation

## 2022-04-24 DIAGNOSIS — Z170 Estrogen receptor positive status [ER+]: Secondary | ICD-10-CM | POA: Diagnosis not present

## 2022-04-24 DIAGNOSIS — S21002A Unspecified open wound of left breast, initial encounter: Secondary | ICD-10-CM | POA: Diagnosis not present

## 2022-04-24 DIAGNOSIS — L590 Erythema ab igne [dermatitis ab igne]: Secondary | ICD-10-CM | POA: Diagnosis not present

## 2022-04-24 MED ORDER — HYDROCODONE-ACETAMINOPHEN 5-325 MG PO TABS
1.0000 | ORAL_TABLET | Freq: Four times a day (QID) | ORAL | 0 refills | Status: DC | PRN
  Filled 2022-04-24: qty 20, 5d supply, fill #0

## 2022-04-24 MED ORDER — SANTYL 250 UNIT/GM EX OINT
1.0000 | TOPICAL_OINTMENT | Freq: Every day | CUTANEOUS | 3 refills | Status: DC
  Filled 2022-04-24 (×2): qty 30, 30d supply, fill #0
  Filled 2022-04-27: qty 30, 8d supply, fill #0
  Filled 2022-05-03 – 2022-05-04 (×2): qty 30, 30d supply, fill #1
  Filled 2022-05-07: qty 30, 7d supply, fill #0
  Filled 2022-05-17: qty 30, 7d supply, fill #1
  Filled 2022-06-02 (×2): qty 30, 7d supply, fill #2

## 2022-04-24 NOTE — Progress Notes (Signed)
[name], [name] ([id number]) [id number] Page 1 of 8 Visit Report for 04/24/2022 Allergy List Details Patient Name: Date of Service: [name], [name] 04/24/2022 8:00 A M Medical Record Number: [id number] Patient Account Number: [id number] Date of Birth/Sex: Treating RN: 07-02-1971 (51 y.o. [name] Primary Care Stockton Nunley: [name] Other Clinician: Referring Terrace Fontanilla: Treating Corbyn Steedman/Extender: Donavan Burnet, [name] Weeks in Treatment: 0 Allergies Active Allergies kiwi Severity: Severe Ativan Severity: Severe erythromycin base Severity: Severe Xanax Severity: Severe nitrofurantoin Severity: Severe Allergy Notes Electronic Signature(s) Signed: 04/24/2022 6:04:10 PM By: Dellie Catholic RN Entered By: Dellie Catholic on 04/24/2022 08:15:39 -------------------------------------------------------------------------------- Arrival Information Details Patient Name: Date of Service: [name] [name], [name] [place] W. 04/24/2022 8:00 A M Medical Record Number: [id number] Patient Account Number: [id number] Date of Birth/Sex: Treating RN: 05/30/1971 (51 y.o. [name] Primary Care Ayyan Sites: [name] Other Clinician: Referring Markeesha Char: Treating Lynora Dymond/Extender: Wendall Mola in Treatment: 0 Visit Information Patient Arrived: Ambulatory Arrival Time: 08:12 Accompanied By: self Transfer Assistance: None Patient Identification Verified: Yes Electronic Signature(s) Signed: 04/24/2022 6:04:10 PM By: Dellie Catholic RN Entered By: Dellie Catholic on 04/24/2022 08:12:50 [name] ([id number]) [id number] Page 2 of 8 -------------------------------------------------------------------------------- Clinic Level of Care Assessment Details Patient Name: Date of Service: [name] [name] 04/24/2022 8:00 A M Medical Record Number: [id number] Patient Account Number:  [id number] Date of Birth/Sex: Treating RN: 10/17/1970 (51 y.o. [name] Primary Care Kai Calico: [name] Other Clinician: Referring [name]: Treating Pacer Dorn/Extender: Wendall Mola in Treatment: 0 Clinic Level of Care Assessment Items TOOL 1 Quantity Score X- 1 0 Use when EandM and Procedure is performed on INITIAL visit ASSESSMENTS - Nursing Assessment / Reassessment X- 1 20 General Physical Exam (combine w/ comprehensive assessment (listed just below) when performed on new pt. evals) X- 1 25 Comprehensive Assessment (HX, ROS, Risk Assessments, Wounds Hx, etc.) ASSESSMENTS - Wound and Skin Assessment / Reassessment '[]'  - 0 Dermatologic / Skin Assessment (not related to wound area) ASSESSMENTS - Ostomy and/or Continence Assessment and Care '[]'  - 0 Incontinence Assessment and Management '[]'  - 0 Ostomy Care Assessment and Management (repouching, etc.) PROCESS - Coordination of Care X - Simple Patient / Family Education for ongoing care 1 15 '[]'  - 0 Complex (extensive) Patient / Family Education for ongoing care X- 1 10 Staff obtains Programmer, systems, Records, T Results / Process Orders est X- 1 10 Staff telephones HHA, Nursing Homes / Clarify orders / etc '[]'  - 0 Routine Transfer to another Facility (non-emergent condition) '[]'  - 0 Routine Hospital Admission (non-emergent condition) X- 1 15 New Admissions / Biomedical engineer / Ordering NPWT Apligraf, etc. , '[]'  - 0 Emergency Hospital Admission (emergent condition) PROCESS - Special Needs '[]'  - 0 Pediatric / Minor Patient Management '[]'  - 0 Isolation Patient Management '[]'  - 0 Hearing / Language / Visual special needs '[]'  - 0 Assessment of Community assistance (transportation, D/C planning, etc.) '[]'  - 0 Additional assistance / Altered mentation '[]'  - 0 Support Surface(s) Assessment (bed, cushion, seat, etc.) INTERVENTIONS - Miscellaneous '[]'  - 0 External ear exam '[]'  - 0 Patient Transfer  (multiple staff / Civil Service fast streamer / Similar devices) '[]'  - 0 Simple Staple / Suture removal (25 or less) '[]'  - 0 Complex Staple / Suture removal (26 or more) '[]'  - 0 Hypo/Hyperglycemic Management (do not check if billed separately) '[]'  - 0 Ankle / Brachial Index (ABI) - do not check if billed separately Has the patient been seen at  the hospital within the last three years: Yes Total Score: 95 Level Of Care: New/Established - Level 3 Electronic Signature(s) Signed: 04/24/2022 6:04:10 PM By: Dellie Catholic RN Higinio Plan, Orange W ([id number]) By: Dellie Catholic RN [phone].pdf Page 3 of 8 Signed: 04/24/2022 6:04:10 PM Entered By: Dellie Catholic on 04/24/2022 18:00:19 -------------------------------------------------------------------------------- Encounter Discharge Information Details Patient Name: Date of Service: [name] [name] 04/24/2022 8:00 A M Medical Record Number: [id number] Patient Account Number: [id number] Date of Birth/Sex: Treating RN: Dec 26, 1970 (51 y.o. [name] Primary Care Toneisha Savary: [name] Other Clinician: Referring Ashleynicole Mcclees: Treating [name]/Extender: Wendall Mola in Treatment: 0 Encounter Discharge Information Items Post Procedure Vitals Discharge Condition: Stable Temperature (F): 98.1 Ambulatory Status: Ambulatory Pulse (bpm): 75 Discharge Destination: Home Respiratory Rate (breaths/min): 16 Transportation: Private Auto Blood Pressure (mmHg): 116/75 Accompanied By: self Schedule Follow-up Appointment: Yes Clinical Summary of Care: Patient Declined Electronic Signature(s) Signed: 04/24/2022 6:04:10 PM By: Dellie Catholic RN Entered By: Dellie Catholic on 04/24/2022 18:01:33 -------------------------------------------------------------------------------- Lower Extremity Assessment Details Patient Name: Date of Service: [name] 04/24/2022 8:00 A M Medical Record Number:  [id number] Patient Account Number: [id number] Date of Birth/Sex: Treating RN: 09-25-1970 (51 y.o. [name] Primary Care Zohaib Heeney: [name] Other Clinician: Referring [name]: Treating Denissa Cozart/Extender: Donavan Burnet, Tobin Chad in Treatment: 0 Electronic Signature(s) Signed: 04/24/2022 6:04:10 PM By: Dellie Catholic RN Entered By: Dellie Catholic on 04/24/2022 08:33:26 -------------------------------------------------------------------------------- Multi Wound Chart Details Patient Name: Date of Service: [name] [name], [name] 04/24/2022 8:00 A M Medical Record Number: [id number] Patient Account Number: [id number] Date of Birth/Sex: Treating RN: 1971-02-03 (51 y.o. F) Primary Care Amin Fornwalt: [name] Other Clinician: Referring Blayne Garlick: Treating Rissa Turley/Extender: Donavan Burnet, [name] Weeks in Treatment: 0 Vital Signs Height(in): 63 Pulse(bpm): 75 Weight(lbs): 170 Blood Pressure(mmHg): 116/75 AMELIANA, BRASHEAR ([id number]) [id number].pdf Page 4 of 8 Body Mass Index(BMI): 30.1 Temperature(F): 98.1 Respiratory Rate(breaths/min): 16 [1:Photos: No Photos Left Breast Wound Location: Surgical Injury Wounding Event: Malignant Wound Primary Etiology: Lymphedema Comorbid History: 04/03/2022 Date Acquired: 0 Weeks of Treatment: Open Wound Status: No Wound Recurrence: 3.2x5.3x0.4 Measurements L x W x D  (cm) 13.32 A (cm) : rea 5.328 Volume (cm) : 12 Position 1 (o'clock): 2.6 Maximum Distance 1 (cm): Yes Tunneling: Full Thickness Without Exposed Classification: Support Structures Medium Exudate A mount: Serosanguineous Exudate Type: red, [name] Exudate  Color: Medium (34-66%) Granulation A mount: Red Granulation Quality: Medium (34-66%) Necrotic A mount: Fat Layer (Subcutaneous Tissue): Yes N/A Exposed Structures: Fascia: No Tendon: No Muscle: No Joint: No Bone: No None Epithelialization: Debridement -  Excisional Debridement:  Pre-procedure Verification/Time Out 08:59 Taken: Lidocaine 4% Topical Solution Pain Control: Fat, Subcutaneous, Slough Tissue Debrided: Skin/Subcutaneous Tissue Level: 16.96 Debridement A (sq cm): rea Curette Instrument: Minimum  Bleeding: Pressure Hemostasis A chieved: 0 Procedural Pain: 0 Post Procedural Pain: Procedure was tolerated well Debridement Treatment Response: 3.2x5.3x0.4 Post Debridement Measurements L x W x D (cm) 5.328 Post Debridement Volume: (cm) No  Abnormalities Noted Periwound Skin Texture: No Abnormalities Noted Periwound Skin Moisture: No Abnormalities Noted Periwound Skin Color: No Abnormality Temperature: Debridement Procedures Performed:] [N/A:N/A N/A N/A N/A N/A N/A N/A N/A N/A N/A N/A N/A  N/A N/A N/A N/A N/A N/A N/A N/A N/A N/A N/A N/A N/A N/A N/A N/A N/A N/A N/A N/A N/A N/A N/A N/A N/A N/A N/A N/A] Treatment Notes Electronic Signature(s) Signed: 04/24/2022 4:47:23 PM By: Linton Ham MD Entered By: Linton Ham on 04/24/2022 09:39:25 -------------------------------------------------------------------------------- Multi-Disciplinary Care  Plan Details Patient Name: Date of Service: [name] [name] 04/24/2022 8:00 A M Medical Record Number: [id number] Patient Account Number: [id number] Date of Birth/Sex: Treating RN: 03/14/1971 (51 y.o. [name] Primary Care Aleiyah Halpin: [name] Other Clinician: Referring Josia Cueva: Treating Isiah Scheel/Extender: Caitlyne, [place], [name] ([id number]) [id number] Page 5 of 8 Weeks in Treatment: 0 Active Inactive Wound/Skin Impairment Nursing Diagnoses: Impaired tissue integrity Goals: Patient/caregiver will verbalize understanding of skin care regimen Date Initiated: 04/24/2022 Target Resolution Date: 07/05/2022 Goal Status: Active Interventions: Assess ulceration(s) every visit Treatment Activities: Skin care regimen initiated :  04/24/2022 Notes: Electronic Signature(s) Signed: 04/24/2022 6:04:10 PM By: Dellie Catholic RN Entered By: Dellie Catholic on 04/24/2022 17:59:02 -------------------------------------------------------------------------------- Pain Assessment Details Patient Name: Date of Service: [name] 04/24/2022 8:00 A M Medical Record Number: [id number] Patient Account Number: [id number] Date of Birth/Sex: Treating RN: [birth year] (51 y.o. [name] Primary Care Aqueelah Cotrell: [name] Other Clinician: Referring Arnet Hofferber: Treating Kaemon Barnett/Extender: Wendall Mola in Treatment: 0 Active Problems Location of Pain Severity and Description of Pain Patient Has Paino Yes Site Locations Pain Location: Generalized Pain With Dressing Change: No Duration of the Pain. Constant / Intermittento Constant Rate the pain. Current Pain Level: 5 Worst Pain Level: 10 Least Pain Level: 5 Tolerable Pain Level: 5 Character of Pain Describe the Pain: Burning Pain Management and Medication Current Pain Management: Medication: Yes Cold Application: No Rest: Yes Massage: No JINNIFER, MONTEJANO ([id number]) [id number] Page 6 of 8 Activity: No T.E.N.S.: No Heat Application: No Leg drop or elevation: No Is the Current Pain Management Adequate: Adequate How does your wound impact your activities of daily livingo Sleep: No Bathing: No Appetite: No Relationship With Others: No Bladder Continence: No Emotions: No Bowel Continence: No Work: No Toileting: No Drive: No Dressing: No Hobbies: No Electronic Signature(s) Signed: 04/24/2022 6:04:10 PM By: Dellie Catholic RN Entered By: Dellie Catholic on 04/24/2022 09:00:23 -------------------------------------------------------------------------------- Patient/Caregiver Education Details Patient Name: Date of Service: [name] 10/20/2023andnbsp8:00 [place] Record Number:  [id number] Patient Account Number: [id number] Date of Birth/Gender: Treating RN: 19-Jul-1970 (51 y.o. [name] Primary Care Physician: [name] Other Clinician: Referring Physician: Treating Physician/Extender: Wendall Mola in Treatment: 0 Education Assessment Education Provided To: Patient Education Topics Provided Wound/Skin Impairment: Methods: Explain/Verbal Responses: Return demonstration correctly Electronic Signature(s) Signed: 04/24/2022 6:04:10 PM By: Dellie Catholic RN Entered By: Dellie Catholic on 04/24/2022 17:59:10 -------------------------------------------------------------------------------- Wound Assessment Details Patient Name: Date of Service: [name] 04/24/2022 8:00 A M Medical Record Number: [id number] Patient Account Number: [id number] Date of Birth/Sex: Treating RN: 12-May-1971 (51 y.o. [name] Primary Care Verlan Grotz: [name] Other Clinician: Referring Chevelle Coulson: Treating Pierce Barocio/Extender: Donavan Burnet, [name] Weeks in Treatment: 0 Wound Status Wound Number: 1 Primary Etiology: Malignant Wound Wound Location: Left Breast Wound Status: Open Wounding Event: Surgical Injury Comorbid History: Lymphedema Date Acquired: 04/03/2022 Weeks Of Treatment: 0 Clustered Wound: No DURENDA, PECHACEK ([id number]) [id number] Page 7 of 8 Wound Measurements Length: (cm) 3.2 Width: (cm) 5.3 Depth: (cm) 0.4 Area: (cm) 13.32 Volume: (cm) 5.328 % Reduction in Area: % Reduction in Volume: Epithelialization: None Tunneling: Yes Position (o'clock): 12 Maximum Distance: (cm) 2.6 Undermining: No Wound Description Classification: Full Thickness Without Exposed Su Exudate Amount: Medium Exudate Type: Serosanguineous Exudate Color: red, [name] pport Structures Wound Bed Granulation Amount: Medium (34-66%) Exposed Structure Granulation Quality: Red Fascia Exposed:  No Necrotic Amount: Medium (  34-66%) Fat Layer (Subcutaneous Tissue) Exposed: Yes Necrotic Quality: Adherent Slough Tendon Exposed: No Muscle Exposed: No Joint Exposed: No Bone Exposed: No Periwound Skin Texture Texture Color No Abnormalities Noted: Yes No Abnormalities Noted: Yes Moisture Temperature / Pain No Abnormalities Noted: Yes Temperature: No Abnormality Treatment Notes Wound #1 (Breast) Wound Laterality: Left Cleanser Normal Saline Discharge Instruction: Cleanse the wound with Normal Saline prior to applying a clean dressing using gauze sponges, not tissue or cotton balls. Soap and Water Discharge Instruction: May shower and wash wound with dial antibacterial soap and water prior to dressing change. [place] Ancillary Kit - 15 Day Supply Discharge Instruction: Use supplies as instructed; Kit contains: (15) Saline Bullets; (15) 3x3 Gauze; 15 pr Gloves Peri-Wound Care Topical Primary Dressing Santyl Ointment Discharge Instruction: Apply nickel thick amount to wound bed as instructed Secondary Dressing ABD Pad, 5x9 Discharge Instruction: Apply over primary dressing as directed. Woven Gauze Sponge, Non-Sterile 4x4 in Discharge Instruction: Apply saline moistened gauze over the Santyl in the wound bed Secured With [place] [facility] T 2x10 (in/yd) ape Discharge Instruction: Secure with tape as directed. Compression Wrap Compression Stockings Add-Ons Electronic Signature(s) Signed: 04/24/2022 6:04:10 PM By: Dellie Catholic RN Entered By: Dellie Catholic on 04/24/2022 09:11:02 [name] ([id number]) [id number] Page 8 of 8 -------------------------------------------------------------------------------- Vitals Details Patient Name: Date of Service: [name], [name] 04/24/2022 8:00 A M Medical Record Number: [id number] Patient Account Number: [id number] Date of Birth/Sex: Treating RN: 06-24-1971 (51 y.o. [name] Primary Care Deno Sida: [name] Other Clinician: Referring Shawntel Farnworth: Treating Katoria Yetman/Extender: Donavan Burnet, Tobin Chad in Treatment: 0 Vital Signs Time Taken: 08:12 Temperature (F): 98.1 Height (in): 63 Pulse (bpm): 75 Weight (lbs): 170 Respiratory Rate (breaths/min): 16 Body Mass Index (BMI): 30.1 Blood Pressure (mmHg): 116/75 Reference Range: 80 - 120 mg / dl Electronic Signature(s) Signed: 04/24/2022 6:04:10 PM By: Dellie Catholic RN Entered By: Dellie Catholic on 04/24/2022 08:13:47

## 2022-04-24 NOTE — Progress Notes (Signed)
[name], [name] ([id number]) [phone] [id number] Page 1 of 4 Visit Report for 04/24/2022 Abuse Risk Screen Details Patient Name: Date of Service: [name], [name] 04/24/2022 8:00 A M Medical Record Number: [id number] Patient Account Number: [id number] Date of Birth/Sex: Treating RN: 11-27-1970 (51 y.o. [name] Primary Care Leighana Neyman: [name] Other Clinician: Referring Reese Senk: Treating Abbott Jasinski/Extender: Donavan Burnet, Tobin Chad in Treatment: 0 Abuse Risk Screen Items Answer ABUSE RISK SCREEN: Has anyone close to you tried to hurt or harm you recentlyo No Do you feel uncomfortable with anyone in your familyo No Has anyone forced you do things that you didnt want to doo No Electronic Signature(s) Signed: 04/24/2022 6:04:10 PM By: Dellie Catholic RN Entered By: Dellie Catholic on 04/24/2022 08:31:21 -------------------------------------------------------------------------------- Activities of Daily Living Details Patient Name: Date of Service: [name] [name] 04/24/2022 8:00 A M Medical Record Number: [id number] Patient Account Number: [id number] Date of Birth/Sex: Treating RN: [birth year] (51 y.o. [name] Primary Care Quasim Doyon: [name] Other Clinician: Referring Alyene Predmore: Treating Elysse Polidore/Extender: Donavan Burnet, Tobin Chad in Treatment: 0 Activities of Daily Living Items Answer Activities of Daily Living (Please select one for each item) Drive Automobile Completely Able T Medications ake Completely Able Use T elephone Completely Able Care for Appearance Completely Able Use T oilet Completely Able Bath / Shower Completely Able Dress Self Completely Able Feed Self Completely Able Walk Completely Able Get In / Out Bed Completely Able Housework Completely Able Prepare Meals Completely [place] for Self Completely Able Electronic  Signature(s) Signed: 04/24/2022 6:04:10 PM By: Dellie Catholic RN Entered By: Dellie Catholic on 04/24/2022 08:31:54 [name] ([id number]) [id number] Page 2 of 4 -------------------------------------------------------------------------------- Education Screening Details Patient Name: Date of Service: [name], [name] 04/24/2022 8:00 A M Medical Record Number: [id number] Patient Account Number: [id number] Date of Birth/Sex: Treating RN: Jul 02, 1971 (51 y.o. 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-as per patient Decreased Hand dexterity: No Knowledge/Comprehension Knowledge Level: High Comprehension Level: High Ability to understand written instructions: High Ability to understand verbal instructions: High Motivation Anxiety Level: Calm Cooperation: Cooperative Education Importance: Acknowledges Need Interest in Health Problems: Asks Questions Perception: Coherent Willingness to Engage in Self-Management High Activities: Readiness to Engage in Self-Management High Activities: Electronic Signature(s) Signed: 04/24/2022 6:04:10 PM By: Dellie Catholic RN Entered By: Dellie Catholic on 04/24/2022 08:32:59 -------------------------------------------------------------------------------- Fall Risk Assessment Details Patient Name: Date of Service: [name], [place] [name] 04/24/2022 8:00 A M Medical  Record Number: [id number] Patient Account Number: [id number] Date of Birth/Sex: Treating RN: [birth year] (51 y.o. [name] Primary Care Jonella Redditt: [name] Other Clinician: Referring Lysette Lindenbaum: Treating Lenis Nettleton/Extender: Donavan Burnet, Tobin Chad in Treatment: 0 Fall Risk Assessment Items Have you had 2 or more falls in the last 12 monthso 0 No Have you had any fall that resulted in injury in the last 12 monthso 0 No TILIA, FASO ([id number]) [phone] [id number] Page 3 of 4 FALLS RISK SCREEN History of falling - immediate or within 3 months 0 No Secondary diagnosis (Do you have 2 or more  medical diagnoseso) 0 No Ambulatory aid None/bed rest/wheelchair/nurse 0 No Crutches/cane/walker 0 No Furniture 0 No Intravenous therapy Access/Saline/Heparin Lock 0 No Gait/Transferring Normal/ bed rest/ wheelchair 0 No Weak (short steps with or without shuffle, stooped but able to lift head while walking, may seek 0 No support from furniture) Impaired (short steps with shuffle, may have difficulty arising from chair, head down, impaired 0 No balance) Mental Status Oriented to own ability 0 No Electronic Signature(s) Signed: 04/24/2022 6:04:10 PM By: Dellie Catholic RN Entered By: Dellie Catholic on 04/24/2022 08:33:05 -------------------------------------------------------------------------------- Foot Assessment Details Patient Name: Date of Service: [name] 04/24/2022 8:00 A M Medical Record Number: [id number] Patient Account Number: [id number] Date of Birth/Sex: Treating RN: 12-22-1970 (51 y.o. [name] Primary Care Barrie Wale: [name] Other Clinician: Referring Yecheskel Kurek: Treating Sintia Mckissic/Extender: Donavan Burnet, [name] Weeks in Treatment: 0 Foot Assessment Items Site Locations + = Sensation present, - = Sensation absent, C = Callus, U = Ulcer R = Redness, [name] = Warmth, M = Maceration, PU =  Pre-ulcerative lesion F = Fissure, S = Swelling, D = Dryness Assessment Right: Left: Other Deformity: No No Prior Foot Ulcer: No No Prior Amputation: No No Charcot Joint: No No Ambulatory Status: Ambulatory Without Help GaitKIMBLY, EANES ([id number]) [phone] [id number] Page 4 of 4 Electronic Signature(s) Signed: 04/24/2022 6:04:10 PM By: Dellie Catholic RN Entered By: Dellie Catholic on 04/24/2022 08:33:19 -------------------------------------------------------------------------------- Nutrition Risk Screening Details Patient Name: Date of Service: [name]. 04/24/2022 8:00 A M Medical Record Number: [id number] Patient Account Number: [id number] Date of Birth/Sex: Treating RN: 02/12/1971 (51 y.o. [name] Primary Care Zanyiah Posten: [name] Other Clinician: Referring Sunny Aguon: Treating Jaanai Salemi/Extender: Donavan Burnet, [name] Weeks in Treatment: 0 Height (in): 63 Weight (lbs): 170 Body Mass Index (BMI): 30.1 Nutrition Risk Screening Items Score Screening NUTRITION RISK SCREEN: I have an illness or condition that made me change the kind and/or amount of food I eat 0 No I eat fewer than two meals per day 0 No I eat few fruits and vegetables, or milk products 0 No I have three or more drinks of beer, liquor or wine almost every day 0 No I have tooth or mouth problems that make it hard for me to eat 0 No I don't always have enough money to buy the food I need 0 No I eat alone most of the time 0 No I take three or more different prescribed or over-the-counter drugs a day 0 No Without wanting to, I have lost or gained 10 pounds in the last six months 0 No I am not always physically able to shop, cook and/or feed myself 0 No Nutrition Protocols Good Risk Protocol 0 No interventions needed Moderate Risk Protocol High Risk Proctocol Risk Level: Good Risk Score: 0 Electronic Signature(s) Signed: 04/24/2022  6:04:10 PM By: Dellie Catholic RN Entered By: Dellie Catholic on 04/24/2022 08:33:11

## 2022-04-24 NOTE — Patient Instructions (Signed)
You did have resolution of fever and improved pain, however still poor wound healing from prior radiation   I agree debridement and hyperbaric would help   For now lets finish the 2 antibiotics until 11/3. Please see me no earlier than 2 weeks after finishing antibiotics. However if fever, increased pus/pain, redness, let me know sooner   Will plan to get some blood tests on next visit

## 2022-04-24 NOTE — Progress Notes (Signed)
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  09/13/2020   Cervix prolapsed into vagina 09/13/2020   Dysuria 09/13/2020   Genetic testing 09/10/2020   Anxiety 06/18/2020   Mild intermittent asthma without complication 45/80/9983   Bloody diarrhea 06/09/2016   Major depressive disorder, recurrent, severe without psychotic features ([place])    Severe recurrent major depression w/psychotic features, mood-congruent ([place]) 05/04/2014   Major depression 05/04/2014   Positive ANA (antinuclear antibody) 04/10/2014   Granulomatosis with polyangiitis with pulmonary involvement ([place]) 02/07/2014   Generalized anxiety disorder 02/07/2014   Insomnia 02/07/2014   Sensorineural hearing loss of right ear 12/06/2013   Dermatitis 12/05/2013   Nasal septal perforation 12/05/2013   Allergic rhinitis 10/12/2006   Asthma 10/12/2006   GERD 10/12/2006      Subjective:    Patient ID: [name], female    DOB: December 31, 1970, 51 y.o.   MRN: [id number]  Chief Complaint  Patient presents with   Hospitalization Follow-up    HPI:  [name] is a 51 y.o. female recently dx'ed left breast cancer s/p lumpectomy and radiation complicated by nonhealing wound and superimposed infection  I saw her in hospital on last visit 9/29-10/05. There was no debridement done during that admission for fever/severe pain. Imaging without abscess  She previously grew mrsa (S doxy) and on another occasion strep angi, h parainflu, prevotella  We discharged er on planed 4 weeks of doxy/amox-clav  I discussed with her surgeon likely wound healing complicated by chronic radiation dermatitis    Her pain is much better/resolved as of 04/24/22  visit today;  no fever or abx intolerance. She had superficial fibrous tissue debridement this morning at the wound center which was painful. She'll see dr Barry Dienes at the breast center today. There has been talk of hyperbaric wound therapy   Allergies  Allergen Reactions   Kiwi Extract Shortness Of Breath and Swelling   Ativan [Lorazepam] Other (See Comments)    Hyperactivity per patient   Erythromycin Base Nausea And Vomiting   Nitrofurantoin Nausea And Vomiting   Other     Latex self dissolving sutures "work their way out"   Xanax [Alprazolam] Other (See Comments)    Makes patient hyperactive       Outpatient Medications Prior to Visit  Medication Sig Dispense Refill   Acetaminophen (TYLENOL PO) Take by mouth as needed.     amoxicillin-clavulanate (AUGMENTIN) 875-125 MG tablet Take 1 tablet by mouth every 12 (twelve) hours. Take 1 tablet every 12 hours for 4 weeks 56 tablet 0   doxycycline (VIBRA-TABS) 100 MG tablet Take 1 tablet (100 mg total) by mouth every 12 (twelve) hours for 28 days. 56 tablet 0   folic acid (FOLVITE) 1 MG tablet Take 1 tablet (1 mg total) by mouth once daily (Patient taking differently: Take 1 mg by mouth daily.) 90 tablet 3   HYDROcodone-acetaminophen (NORCO/VICODIN) 5-325 MG tablet Take 1-2 tablets by mouth every 4 (four) hours as needed for moderate pain or severe pain. 30 tablet 0   hydroxychloroquine (PLAQUENIL) 200 MG tablet Take 1 tablet (200 mg total) by mouth 2 (two) times daily (Patient taking differently: Take 200 mg by mouth  daily.) 60 tablet 5   hydrOXYzine (VISTARIL) 25 MG capsule Take 1 capsule (25 mg total) by mouth daily as needed. (Patient taking differently: Take 25 mg by mouth daily.) 90 capsule 0   letrozole (FEMARA) 2.5 MG tablet Take 1 tablet (2.5 mg total) by mouth daily. 30 tablet 3   montelukast (SINGULAIR) 10 MG tablet Take 1 tablet (10 mg total) by mouth daily. 30 tablet 6   pantoprazole (PROTONIX) 40 MG tablet TAKE ONE TABLET BY MOUTH  TWICE A DAY BEFORE FOOD (Patient taking differently: Take 40 mg by mouth 2 (two) times daily.) 180 tablet 0   sertraline (ZOLOFT) 100 MG tablet Take 1.5 tablets (150 mg total) by mouth daily. (Patient taking differently: Take 100 mg by mouth daily.) 140 tablet 0   sucralfate (CARAFATE) 1 g tablet Take 1 g by mouth 2 (two) times daily as needed (Antacid).     ADVAIR DISKUS 100-50 MCG/DOSE AEPB INHALE 1 PUFF INTO THE LUNGS TWICE DAILY (Patient not taking: Reported on 04/03/2022) 1 each 0   albuterol (VENTOLIN HFA) 108 (90 Base) MCG/ACT inhaler Inhale 2 puffs into the lungs every 6 (six) hours as needed for wheezing or shortness of breath (cough, shortness of breath or wheezing.). (Patient not taking: Reported on 04/24/2022) 1 each 1   collagenase (SANTYL) 250 UNIT/GM ointment Apply topically to wound daily. 30 g 3   ibuprofen (ADVIL) 800 MG tablet Take 1 tablet (800 mg total) by mouth every 8 (eight) hours as needed. (Patient not taking: Reported on 04/24/2022) 30 tablet 1   leptospermum manuka honey (MEDIHONEY) PSTE paste Apply 1 Application topically daily. (Patient not taking: Reported on 04/24/2022) 44 mL 1   lidocaine-nystatin-hydrocortisone-diphenhydrAMINE Swish and spit 5 mLs by mouth every 6 (six) hours as needed (Patient not taking: Reported on 04/03/2022) 120 mL 0   ondansetron (ZOFRAN-ODT) 4 MG disintegrating tablet Take 1 tablet (4 mg total) by mouth every 6 (six) hours as needed for nausea. (Patient not taking: Reported on 04/24/2022) 20 tablet 0   No facility-administered medications prior to visit.     Social History   Socioeconomic History   Marital status: Divorced    Spouse name: Not on file   Number of children: Not on file   Years of education: Not on file   Highest education level: Not on file  Occupational History   Not on file  Tobacco Use   Smoking status: Never   Smokeless tobacco: Never  Vaping Use   Vaping Use: Never used  Substance and Sexual Activity   Alcohol  use: Yes    Comment: occasional   Drug use: Never   Sexual activity: Yes    Birth control/protection: Surgical    Comment: hyst  Other Topics Concern   Not on file  Social History Narrative   Marital status: separated in 11/2013; married x 22 years.  Dating x October 2016      Children:  2 children (14, 16).      Lives: with 1 children; other daughter living in [place] with father.      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      Tobacco:  None      Alcohol: socially; weekends.      Exercise: 3 times per week; aerobics; elliptical; [employer].      Seatbelt: 100%; no texting      Sexual activity:  Yes; no STDs.  Social Determinants of Health   Financial Resource Strain: Not on file  Food Insecurity: Not on file  Transportation Needs: Not on file  Physical Activity: Not on file  Stress: Not on file  Social Connections: Not on file  Intimate Partner Violence: Not on file      Review of Systems    All other ros negative  Objective:    BP 130/86   Pulse 64   Temp 98.4 F (36.9 C) (Oral)   Ht '5\' 3"'$  (1.6 m)   Wt 175 lb (79.4 kg)   SpO2 98%   BMI 31.00 kg/m  Nursing note and vital signs reviewed.  Physical Exam     General/constitutional: no distress, pleasant HEENT: Normocephalic, PER, Conj Clear, EOMI, Oropharynx clear Neck supple CV: rrr no mrg Lungs: clear to auscultation, normal respiratory effort Abd: Soft, Nontender Ext: no edema Skin: see pic from today's morning Neuro: nonfocal MSK: no peripheral joint swelling/tenderness/warmth; back spines nontender     Labs:  Micro:  Serology:  Imaging:  Assessment & Plan:   Problem List Items Addressed This Visit   None Visit Diagnoses     Skin infection    -  Primary   Chronic radiation dermatitis       Malignant neoplasm of left breast in female, estrogen receptor positive, unspecified site of  breast ([place])          She has improved symptoms on antibiotics (pain/resolved fever). The wound is still not closing and needed wound clinic fibrous tissue debridement 04/24/22 today  Continue current antibiotics  Ultimately she might need hyperbaric wound therapy and ongoing wound care to help close the wound   I will see her again in around 5-6 weeks (2 weeks at least after abx finished) to reassess for any ongoing sign of infection    Follow-up: Return in about 6 weeks (around 06/05/2022).      Jabier Mutton, [facility] 04/24/2022, 10:28 AM

## 2022-04-27 ENCOUNTER — Other Ambulatory Visit (HOSPITAL_COMMUNITY): Payer: Self-pay

## 2022-04-27 DIAGNOSIS — S21002A Unspecified open wound of left breast, initial encounter: Secondary | ICD-10-CM | POA: Diagnosis not present

## 2022-04-27 NOTE — Progress Notes (Signed)
ZANDREA, KENEALY ([id number]) [id number] Page 1 of 8 Visit Report for 04/24/2022 Chief Complaint Document Details Patient Name: Date of Service: [name], [name] 04/24/2022 8:00 A M Medical Record Number: [id number] Patient Account Number: [id number] Date of Birth/Sex: Treating RN: 1970/11/03 (51 y.o. F) Primary Care Provider: [name] Other Clinician: Referring Provider: Treating Provider/Extender: Donavan Burnet, Tobin Chad in Treatment: 0 Information Obtained from: Patient Chief Complaint 04/24/2022; patient is here for a surgical wound on her left lateral breast Electronic Signature(s) Signed: 04/24/2022 4:47:23 PM By: Linton Ham MD Entered By: Linton Ham on 04/24/2022 09:40:27 -------------------------------------------------------------------------------- Debridement Details Patient Name: Date of Service: [name] Mattes. 04/24/2022 8:00 A M Medical Record Number: [id number] Patient Account Number: [id number] Date of Birth/Sex: Treating RN: [birth year] (51 y.o. F) Primary Care Provider: [name] Other Clinician: Referring Provider: Treating Provider/Extender: Donavan Burnet, Tobin Chad in Treatment: 0 Debridement Performed for Assessment: Wound #1 Left Breast Performed By: Physician Ricard Dillon., MD Debridement Type: Debridement Level of Consciousness (Pre-procedure): Awake and Alert Pre-procedure Verification/Time Out Yes - 08:59 Taken: Start Time: 08:59 Pain Control: Lidocaine 4% T opical Solution T Area Debrided (L x W): otal 3.2 (cm) x 5.3 (cm) = 16.96 (cm) Tissue and other material debrided: Non-Viable, Fat, Slough, Subcutaneous, Slough Level: Skin/Subcutaneous Tissue Debridement Description: Excisional Instrument: Curette Bleeding: Minimum Hemostasis Achieved: Pressure End Time: 09:01 Procedural Pain: 0 Post Procedural Pain: 0 Response to Treatment: Procedure was tolerated  well Level of Consciousness (Post- Awake and Alert procedure): Post Debridement Measurements of Total Wound Length: (cm) 3.2 Width: (cm) 5.3 Depth: (cm) 0.4 Volume: (cm) 5.328 Character of Wound/Ulcer Post Debridement: Improved Post Procedure Diagnosis AMISHA, POSPISIL ([id number]) [id number] Page 2 of 8 Same as Pre-procedure Notes Scribed for Dr. Dellia Nims by J.Scotton Electronic Signature(s) Signed: 04/24/2022 4:47:23 PM By: Linton Ham MD Entered By: Linton Ham on 04/24/2022 09:39:37 -------------------------------------------------------------------------------- HPI Details Patient Name: Date of Service: [name], [name] 04/24/2022 8:00 A M Medical Record Number: [id number] Patient Account Number: [id number] Date of Birth/Sex: Treating RN: 06/29/1971 (51 y.o. F) Primary Care Provider: [name] Other Clinician: Referring Provider: Treating Provider/Extender: Donavan Burnet, Tobin Chad in Treatment: 0 History of Present Illness HPI Description: ADMISSION 05/25/2022 This is a 51 year old woman who is found to have an abnormal mammogram of her left breast earlier this year showing a 13 mm group of calcifications in the upper quadrant of the left breast. 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 She underwent a left lumpectomy on 11/12/2021. The patient states the wound never really healed and was open. She underwent radiation therapy from 12/10/2021 through 01/19/22 28 fractions of 1.8GY. Essentially the wound would not heal. She was treated several times with antibiotics finally on 03/25/2022 she was taken to the OR by Dr. Barry Dienes for excision of the wound in a sinus. Operative cultures grew strep and Prevotella and a culture from the clinic Livedo MRSA. It was recommended for 4 weeks of Augmentin and doxycycline by infectious disease. She was seen by  wound care and she has been treating the wound with Medihoney ever since. 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 She was on meth methotrexate 20 mg once a  week however this I think was put on hold because of the wound followed by Dr. Posey Pronto of rheumatology. She has asthma. Most recent breast ultrasound was on 9/7 She has a large nonhealing wound on the lateral aspect of her left breast Electronic Signature(s) Signed: 04/24/2022 4:47:23 PM By: Linton Ham MD Entered By: Linton Ham on 04/24/2022 09:45:26 -------------------------------------------------------------------------------- Physical Exam Details Patient Name: Date of Service: [name] Mattes. 04/24/2022 8:00 A M Medical Record Number: [id number] Patient Account Number: [id number] Date of Birth/Sex: Treating RN: 01-15-1971 (51 y.o. F) Primary Care Provider: [name] Other Clinician: Referring Provider: Treating Provider/Extender: Donavan Burnet, [name] Weeks in Treatment: 0 Constitutional Sitting or standing Blood Pressure is within target range for patient.. Pulse regular and within target range for patient.Marland Kitchen Respirations regular, non-labored and within target range.. Temperature is normal and within the target range for the patient.Marland Kitchen Appears in no distress. Chest Left breast skin and subcutaneous firmness but without a clear mass. This is no doubt radiation changes. No drainage no odor. Notes KEASHIA, HASKINS ([id number]) [id number] Page 3 of 8 Wound exam; fairly large area on the left breast mid aspect. Completely nonviable surface necrotic fat subcutaneous tissue. Using an open curette I removed as much of this as the patient  could tolerate. There is still unfortunately more to do she has a small sinus at 12:00 in the wound of roughly 2-1/2 cm there was no purulent drainage coming from this. There is no surrounding tenderness around the wound no crepitus but again skin and subcutaneous changes likely related to radiation Electronic Signature(s) Signed: 04/24/2022 4:47:23 PM By: Linton Ham MD Entered By: Linton Ham on 04/24/2022 09:46:57 -------------------------------------------------------------------------------- Physician Orders Details Patient Name: Date of Service: [name] [name] 04/24/2022 8:00 A M Medical Record Number: [id number] Patient Account Number: [id number] Date of Birth/Sex: Treating RN: 1970-10-17 (51 y.o. [name] Primary Care Provider: [name] Other Clinician: Referring Provider: Treating Provider/Extender: Wendall Mola in Treatment: 0 Verbal / Phone Orders: No Diagnosis Coding Follow-up Appointments ppointment in 1 week. - Dr. Celine Ahr Room 3 04/30/22 at 12:30pm Return A Anesthetic (In clinic) Topical Lidocaine 4% applied to wound bed - used in Clinic Bathing/ Shower/ Hygiene Other Bathing/Shower/Hygiene Orders/Instructions: - Change dressing after bathing Edema Control - Lymphedema / SCD / Other Other Edema Control Orders/Instructions: - Keep doing the Lymphadema checks Wound Treatment Wound #1 - Breast Wound Laterality: Left Cleanser: Normal Saline (DME) (Generic) 1 x Per Day/30 Days Discharge Instructions: Cleanse the wound with Normal Saline prior to applying a clean dressing using gauze sponges, not tissue or cotton balls. Cleanser: Soap and Water 1 x Per Day/30 Days Discharge Instructions: May shower and wash wound with dial antibacterial soap and water prior to dressing change. Cleanser: Byram Ancillary Kit - 15 Day Supply (DME) (Generic) 1 x Per Day/30 Days Discharge Instructions: Use supplies as instructed; Kit contains:  (15) Saline Bullets; (15) 3x3 Gauze; 15 pr Gloves Prim Dressing: Santyl Ointment (Generic) 1 x Per Day/30 Days ary Discharge Instructions: Apply nickel thick amount to wound bed as instructed Secondary Dressing: ABD Pad, 5x9 (DME) (Generic) 1 x Per Day/30 Days Discharge Instructions: Apply over primary dressing as directed. Secondary Dressing: Woven Gauze Sponge, Non-Sterile 4x4 in (DME) (Generic) 1 x Per Day/30 Days Discharge Instructions: Apply saline moistened gauze over the Santyl in the wound bed Secured With: 46M Medipore Soft Cloth Surgical T 2x10 (in/yd) (DME) (Generic) 1 x Per Day/30 Days ape Discharge Instructions:  Secure with tape as directed. Electronic Signature(s) Signed: 04/24/2022 4:47:23 PM By: Linton Ham MD Signed: 04/24/2022 6:04:10 PM By: Dellie Catholic RN Previous Signature: 04/24/2022 9:17:16 AM Version By: Linton Ham MD Entered By: Dellie Catholic on 04/24/2022 09:22:04 [name] ([id number]) [id number] Page 4 of 8 -------------------------------------------------------------------------------- Problem List Details Patient Name: Date of Service: [name], [name] 04/24/2022 8:00 A M Medical Record Number: [id number] Patient Account Number: [id number] Date of Birth/Sex: Treating RN: 08-04-1970 (51 y.o. F) Primary Care Provider: [name] Other Clinician: Referring Provider: Treating Provider/Extender: Donavan Burnet, Tobin Chad in Treatment: 0 Active Problems ICD-10 Encounter Code Description Active Date MDM Diagnosis T81.31XD Disruption of external operation (surgical) wound, not elsewhere classified, 04/24/2022 No Yes subsequent encounter L59.8 Other specified disorders of the skin and subcutaneous tissue related to 04/24/2022 No Yes radiation S21.002D Unspecified open wound of left breast, subsequent encounter 04/24/2022 No Yes 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 04/24/2022  No Yes Inactive Problems Resolved Problems Electronic Signature(s) Signed: 04/24/2022 4:47:23 PM By: Linton Ham MD Entered By: Linton Ham on 04/24/2022 09:39:19 -------------------------------------------------------------------------------- Progress Note Details Patient Name: Date of Service: [name] [name] 04/24/2022 8:00 A M Medical Record Number: [id number] Patient Account Number: [id number] Date of Birth/Sex: Treating RN: 04/23/1971 (51 y.o. F) Primary Care Provider: [name] Other Clinician: Referring Provider: Treating Provider/Extender: Donavan Burnet, Tobin Chad in Treatment: 0 Subjective Chief Complaint Information obtained from Patient 04/24/2022; patient is here for a surgical wound on her left lateral breast History of Present Illness (HPI) ADMISSION 05/25/2022 KATERI, BALCH ([id number]) [phone].pdf Page 5 of 8 This is a 51 year old woman who is found to have an abnormal mammogram of her left breast earlier this year showing a 13 mm group of calcifications in the upper quadrant of the left breast. 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 She underwent a left lumpectomy on 11/12/2021. The patient states the wound never really healed and was open. She underwent radiation therapy from 12/10/2021 through 01/19/22 28 fractions of 1.8GY. Essentially the wound would not heal. She was treated several times with antibiotics finally on 03/25/2022 she was taken to the OR by Dr. Barry Dienes for excision of the wound in a sinus. Operative cultures grew strep and Prevotella and a culture from the clinic Livedo MRSA. It was recommended for 4 weeks of Augmentin and doxycycline by infectious disease. She was seen by wound care and she has been treating the wound with Medihoney ever since. 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 She was on meth methotrexate 20 mg once a week however this I think was put on hold because of the wound followed by Dr. Posey Pronto of rheumatology. She has asthma. Most recent breast ultrasound was on 9/7 She has a large nonhealing wound on the lateral aspect of her left breast Patient History Information obtained from Patient. Allergies kiwi (Severity: Severe), Ativan (Severity: Severe), erythromycin base (Severity: Severe), Xanax (Severity: Severe), nitrofurantoin (Severity: Severe) Social History Never smoker, Marital Status - Married, Alcohol Use - Moderate, Drug Use - No History, Caffeine Use - Daily. Medical History Hematologic/Lymphatic Patient has history of Lymphedema - Left breast-gets Lymphadema tx Hospitalization/Surgery History - 03/25/22 Left Breast cyst  excision; 11/12/21 Left Breast Lumpectomy with Radioactive seeds;Left breat Biopsy;Anterior. 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 12/08/21- 01/29/22- 37 rounds of Radiation Psychiatric General Anxiety Review of Systems (ROS) Constitutional Symptoms (General Health) Denies complaints or symptoms of Fatigue, Fever, Chills, Marked Weight Change. Eyes Denies complaints or symptoms of Dry Eyes, Vision Changes, Glasses / Contacts. Integumentary (Skin) Complains or has symptoms of Wounds - Left Breast. Objective Constitutional Sitting or standing Blood Pressure is within target range for patient.. Pulse regular and within target range for patient.Marland Kitchen Respirations regular, non-labored and within target range.. Temperature is normal and within  the target range for the patient.Marland Kitchen Appears in no distress. Vitals Time Taken: 8:12 AM, Height: 63 in, Weight: 170 lbs, BMI: 30.1, Temperature: 98.1 F, Pulse: 75 bpm, Respiratory Rate: 16 breaths/min, Blood Pressure: 116/75 mmHg. Chest Left breast skin and subcutaneous firmness but without a clear mass. This is no doubt radiation changes. No drainage no odor. General Notes: Wound exam; fairly large area on the left breast mid aspect. Completely nonviable surface necrotic fat subcutaneous tissue. Using an open curette I removed as much of this as the patient could tolerate. There is still unfortunately more to do she has a small sinus at 12:00 in the wound of roughly 2-1/2 cm there was no purulent drainage coming from this. There is no surrounding tenderness around the wound no crepitus but again skin and subcutaneous changes likely related to radiation Integumentary (Hair, Skin) Wound #1 status is Open. Original cause of wound was Surgical Injury. The date acquired was: 04/03/2022. The wound is located on the Left Breast. The wound measures 3.2cm length x 5.3cm width x 0.4cm depth; 13.32cm^2 area and 5.328cm^3 volume. There is Fat Layer (Subcutaneous Tissue) exposed. There is no undermining noted, however, there is tunneling at 12:00 with a maximum distance of 2.6cm. There is a medium amount of serosanguineous drainage noted. There is medium (34-66%) red granulation within the wound bed. There is a medium (34-66%) amount of necrotic tissue within the wound bed including Adherent Slough. The periwound skin appearance had no abnormalities noted for texture. The periwound skin appearance had no abnormalities noted for moisture. The periwound skin appearance had no abnormalities noted for color. Periwound temperature was noted as No Abnormality. [name], [name] ([id number]) [id number] Page 6 of 8 Assessment Active Problems ICD-10 Disruption of external operation  (surgical) wound, not elsewhere classified, subsequent encounter Other specified disorders of the skin and subcutaneous tissue related to radiation Unspecified open wound of left breast, subsequent encounter Intraductal carcinoma in situ of left breast Procedures Wound #1 Pre-procedure diagnosis of Wound #1 is a Malignant Wound located on the Left Breast . There was a Excisional Skin/Subcutaneous Tissue Debridement with a total area of 16.96 sq cm performed by Ricard Dillon., MD. With the following instrument(s): Curette to remove Non-Viable tissue/material. Material removed includes Fat, Subcutaneous Tissue, and Slough after achieving pain control using Lidocaine 4% T opical Solution. No specimens were taken. A time out was conducted at 08:59, prior to the start of the procedure. A Minimum amount of bleeding was controlled with Pressure. The procedure was tolerated well with a pain level of 0 throughout and a pain level of 0 following the procedure. Post Debridement Measurements: 3.2cm length x 5.3cm width x 0.4cm depth; 5.328cm^3 volume. Character of Wound/Ulcer Post Debridement is improved. Post procedure Diagnosis Wound #1: Same as Pre-Procedure General  Notes: Scribed for Dr. Dellia Nims by J.Scotton. Plan Follow-up Appointments: Return Appointment in 1 week. - Dr. Celine Ahr Room 3 04/30/22 at 12:30pm Anesthetic: (In clinic) Topical Lidocaine 4% applied to wound bed - used in Clinic Bathing/ Shower/ Hygiene: Other Bathing/Shower/Hygiene Orders/Instructions: - Change dressing after bathing Edema Control - Lymphedema / SCD / Other: Other Edema Control Orders/Instructions: - Keep doing the Lymphadema checks WOUND #1: - Breast Wound Laterality: Left Cleanser: Normal Saline (DME) (Generic) 1 x Per Day/30 Days Discharge Instructions: Cleanse the wound with Normal Saline prior to applying a clean dressing using gauze sponges, not tissue or cotton balls. Cleanser: Soap and Water 1 x Per Day/30  Days Discharge Instructions: May shower and wash wound with dial antibacterial soap and water prior to dressing change. Cleanser: Byram Ancillary Kit - 15 Day Supply (DME) (Generic) 1 x Per Day/30 Days Discharge Instructions: Use supplies as instructed; Kit contains: (15) Saline Bullets; (15) 3x3 Gauze; 15 pr Gloves Prim Dressing: Santyl Ointment (Generic) 1 x Per Day/30 Days ary Discharge Instructions: Apply nickel thick amount to wound bed as instructed Secondary Dressing: ABD Pad, 5x9 (DME) (Generic) 1 x Per Day/30 Days Discharge Instructions: Apply over primary dressing as directed. Secondary Dressing: Woven Gauze Sponge, Non-Sterile 4x4 in (DME) (Generic) 1 x Per Day/30 Days Discharge Instructions: Apply saline moistened gauze over the Santyl in the wound bed Secured With: 59M Medipore Soft Cloth Surgical T 2x10 (in/yd) (DME) (Generic) 1 x Per Day/30 Days ape Discharge Instructions: Secure with tape as directed. 1. Nonhealing surgical wound complicated by soft tissue radiation and recent and recurrent infections in the area. 2. Completely nonviable surface undergoing mechanical debridement is required she will need ongoing debridement. Prescribed Santyl today 3. I saw no evidence of infection. No cultures were done 4. We will use Santyl with backing moist gauze she will change this daily. Likely require further mechanical debridements to clean up the surface of this. Then my thinking would be potential Apligraf 5. 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 As I understand things the 49-monthqualification time post radiation is since gone by the board although we will need to check into this. She is not ready for this currently. I spent 50 minutes in review of this patient's past medical history, face-to-face evaluation and preparation of this record Electronic Signature(s) Signed: 04/24/2022 4:47:23 PM By: RLinton HamMD Entered  By: RLinton Hamon 04/24/2022 09:50:37 BMarcello [name]([id number] Page 7 of 8 -------------------------------------------------------------------------------- HxROS Details Patient Name: Date of Service: [name], SCandie [name] 04/24/2022 8:00 A M Medical Record Number: [id number] Account Number: [id number] of Birth/Sex: Treating RN: 613-May-1972(51y.o. FAmerica BrownPrimary Care Provider: PEarly OsmondOther Clinician: Referring Provider: Treating Provider/Extender: RWendall Molain Treatment: 0 Information Obtained From Patient Constitutional Symptoms (General Health) Complaints and Symptoms: Negative for: Fatigue; Fever; Chills; Marked Weight Change Eyes Complaints and Symptoms: Negative for: Dry Eyes; Vision Changes; Glasses / Contacts Integumentary (Skin) Complaints and Symptoms: Positive for: Wounds - Left Breast Ear/Nose/Mouth/Throat Medical History: Past Medical History Notes: Left ear limited hearing Hematologic/Lymphatic Medical History: Positive for: Lymphedema - Left breast-gets Lymphadema tx Gastrointestinal Medical History: Past Medical History Notes: GERD Immunological Medical History: Past Medical History Notes: Wegener"s Granulomatosis Musculoskeletal Medical History: Past Medical History Notes: "RA like arthritis" as per patient Oncologic Medical History: Past Medical History Notes: Left Breast. 12/08/21- 01/29/22- 37 rounds of Radiation Psychiatric Medical History:  Past Medical History Notes: General Anxiety Immunizations Pneumococcal Vaccine: Received Pneumococcal Vaccination: Yes Received Pneumococcal Vaccination On or After 60th Birthday: No Implantable Devices Yes Hospitalization / Surgery History AHMIYA, ABEE ([id number]) [id number] Page 8 of 8 Type of Hospitalization/Surgery 03/25/22 Left Breast cyst excision; 11/12/21  Left Breast Lumpectomy with Radioactive seeds;Left breat Biopsy;Anterior Family and Social History Never smoker; Marital Status - Married; Alcohol Use: Moderate; Drug Use: No History; Caffeine Use: Daily; Financial Concerns: No; Food, Clothing or Shelter Needs: No; Support System Lacking: No; Transportation Concerns: No Engineer, maintenance) Signed: 04/24/2022 4:47:23 PM By: Linton Ham MD Signed: 04/24/2022 6:04:10 PM By: Dellie Catholic RN Entered By: Dellie Catholic on 04/24/2022 08:31:06 -------------------------------------------------------------------------------- SuperBill Details Patient Name: Date of Service: [name] Mattes. 04/24/2022 Medical Record Number: [id number] Patient Account Number: [id number] Date of Birth/Sex: Treating RN: 11-12-1970 (51 y.o. F) Primary Care Provider: [name] Other Clinician: Referring Provider: Treating Provider/Extender: Donavan Burnet, [name] Weeks in Treatment: 0 Diagnosis Coding ICD-10 Codes Code Description T81.31XD Disruption of external operation (surgical) wound, not elsewhere classified, subsequent encounter 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 S21.002D Unspecified open wound of left breast, subsequent encounter 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 Facility Procedures : CPT4 Code: 53202334 Description: [facility] VISIT-LEV 3 EST PT Modifier: Quantity: 1 : CPT4 Code: [id number] Description: 37290 - DEB SUBQ TISSUE 20 SQ CM/< ICD-10 Diagnosis Description S21.002D Unspecified open wound of left breast, subsequent encounter Modifier: Quantity: 1 Physician Procedures : CPT4 Code Description Modifier [id number] 08022 - WC PHYS LEVEL 4 - NEW PT 25 ICD-10 Diagnosis Description T81.31XD Disruption of external operation (surgical) wound, not elsewhere classified, subsequent encounter L59.8 Other specified disorders of the  skin and subcutaneous  tissue related to radiation S21.002D Unspecified open wound of left breast, subsequent encounter 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 Quantity: 1 : [id number] 49753 - WC PHYS SUBQ TISS 20 SQ CM ICD-10 Diagnosis Description S21.002D Unspecified open wound of left breast, subsequent encounter Quantity: 1 Electronic Signature(s) Signed: 04/24/2022 6:04:10 PM By: Dellie Catholic RN Signed: 04/27/2022 12:37:39 PM By: Linton Ham MD Previous Signature: 04/24/2022 4:47:23 PM Version By: Linton Ham MD Entered By: Dellie Catholic on 04/24/2022 18:00:30

## 2022-04-30 ENCOUNTER — Encounter (HOSPITAL_BASED_OUTPATIENT_CLINIC_OR_DEPARTMENT_OTHER): Payer: 59 | Admitting: General Surgery

## 2022-04-30 DIAGNOSIS — L98492 Non-pressure chronic ulcer of skin of other sites with fat layer exposed: Secondary | ICD-10-CM | POA: Diagnosis not present

## 2022-04-30 DIAGNOSIS — T8131XA Disruption of external operation (surgical) wound, not elsewhere classified, initial encounter: Secondary | ICD-10-CM | POA: Diagnosis not present

## 2022-04-30 DIAGNOSIS — C50912 Malignant neoplasm of unspecified site of left female breast: Secondary | ICD-10-CM | POA: Diagnosis not present

## 2022-04-30 NOTE — Progress Notes (Signed)
ARDEN, AXON ([id number]) [id number] Page 1 of 7 Visit Report for 04/30/2022 Arrival Information Details Patient Name: Date of Service: [name] [name] 04/30/2022 12:30 PM Medical Record Number: [id number] Patient Account Number: [id number] Date of Birth/Sex: Treating RN: 01/15/1971 (51 y.o. [name] Primary Care Hymie Gorr: [name] Other Clinician: Referring Marabelle Cushman: Treating Tarren Sabree/Extender: [name], [name] Weeks in Treatment: 0 Visit Information History Since Last Visit Added or deleted any medications: No Patient Arrived: Ambulatory Any new allergies or adverse reactions: No Arrival Time: 12:47 Had a fall or experienced change in No Accompanied By: self activities of daily living that may affect Transfer Assistance: None risk of falls: Patient Identification Verified: Yes Signs or symptoms of abuse/neglect since last visito No Hospitalized since last visit: No Implantable device outside of the clinic excluding No cellular tissue based products placed in the center since last visit: Has Dressing in Place as Prescribed: Yes Pain Present Now: Yes Electronic Signature(s) Signed: 04/30/2022 5:21:53 PM By: Dellie Catholic RN Entered By: Dellie Catholic on 04/30/2022 12:48:19 -------------------------------------------------------------------------------- Encounter Discharge Information Details Patient Name: Date of Service: [name], [name] NIE W. 04/30/2022 12:30 PM Medical Record Number: [id number] Patient Account Number: [id number] Date of Birth/Sex: Treating RN: 1971/05/16 (51 y.o. [name] Primary Care Julian Medina: [name] Other Clinician: Referring Eulon Allnutt: Treating Miko Sirico/Extender: [name], [name] Weeks in Treatment: 0 Encounter Discharge Information Items Post Procedure Vitals Discharge Condition: Stable Temperature (F): 98.5 Ambulatory Status: Ambulatory Pulse  (bpm): 96 Discharge Destination: Home Respiratory Rate (breaths/min): 16 Transportation: Private Auto Blood Pressure (mmHg): 122/83 Accompanied By: self Schedule Follow-up Appointment: Yes Clinical Summary of Care: Patient Declined Electronic Signature(s) Signed: 04/30/2022 5:21:53 PM By: Dellie Catholic RN Entered By: Dellie Catholic on 04/30/2022 17:21:30 [name] ([id number]) [id number] Page 2 of 7 -------------------------------------------------------------------------------- Lower Extremity Assessment Details Patient Name: Date of Service: [name] 04/30/2022 12:30 PM Medical Record Number: [id number] Patient Account Number: [id number] Date of Birth/Sex: Treating RN: 16-Oct-1970 (51 y.o. [name] Primary Care Orpah Hausner: [name] Other Clinician: Referring Alferd Obryant: Treating Anterrio Mccleery/Extender: [name], [name] Weeks in Treatment: 0 Electronic Signature(s) Signed: 04/30/2022 5:21:53 PM By: Dellie Catholic RN Entered By: Dellie Catholic on 04/30/2022 12:51:11 -------------------------------------------------------------------------------- Multi Wound Chart Details Patient Name: Date of Service: [name], [name] 04/30/2022 12:30 PM Medical Record Number: [id number] Patient Account Number: [id number] Date of Birth/Sex: Treating RN: 05-13-1971 (51 y.o. F) Primary Care Davionne Dowty: [name] Other Clinician: Referring Absalom Aro: Treating Tarnisha Kachmar/Extender: [name], [name] Weeks in Treatment: 0 Vital Signs Height(in): 63 Pulse(bpm): 96 Weight(lbs): 170 Blood Pressure(mmHg): 122/83 Body Mass Index(BMI): 30.1 Temperature(F): 98.5 Respiratory Rate(breaths/min): 16 [1:Photos:] [N/A:N/A] Left Breast N/A N/A Wound Location: Surgical Injury N/A N/A Wounding Event: Malignant Wound N/A N/A Primary Etiology: Lymphedema N/A N/A Comorbid History: 04/03/2022 N/A N/A Date  Acquired: 0 N/A N/A Weeks of Treatment: Open N/A N/A Wound Status: No N/A N/A Wound Recurrence: 3.1x5.2x0.4 N/A N/A Measurements L x W x D (cm) 12.661 N/A N/A A (cm) : rea 5.064 N/A N/A Volume (cm) : 4.90% N/A N/A % Reduction in A rea: 5.00% N/A N/A % Reduction in Volume: Full Thickness Without Exposed N/A N/A Classification: Support Structures Medium N/A N/A Exudate Amount: Serosanguineous N/A N/A Exudate Type: red, brown N/A N/A Exudate Color: Medium (34-66%) N/A N/A Granulation Amount: Red N/A N/A Granulation Quality: Medium (34-66%) N/A N/A Necrotic Amount: Fat Layer (Subcutaneous Tissue): Yes N/A N/A Exposed Structures: [name], [name] ([id number]) [id number] Page 3  of 7 Fascia: No Tendon: No Muscle: No Joint: No Bone: No None N/A N/A Epithelialization: Debridement - Excisional N/A N/A Debridement: Pre-procedure Verification/Time Out 13:13 N/A N/A Taken: Lidocaine 4% Topical Solution N/A N/A Pain Control: Other, Subcutaneous, Slough N/A N/A Tissue Debrided: Skin/Subcutaneous Tissue N/A N/A Level: 16.12 N/A N/A Debridement A (sq cm): rea Curette N/A N/A Instrument: Minimum N/A N/A Bleeding: Pressure N/A N/A Hemostasis A chieved: 0 N/A N/A Procedural Pain: 0 N/A N/A Post Procedural Pain: Procedure was tolerated well N/A N/A Debridement Treatment Response: 3.1x5.2x0.4 N/A N/A Post Debridement Measurements L x W x D (cm) 5.064 N/A N/A Post Debridement Volume: (cm) No Abnormalities Noted N/A N/A Periwound Skin Texture: No Abnormalities Noted N/A N/A Periwound Skin Moisture: No Abnormalities Noted N/A N/A Periwound Skin Color: No Abnormality N/A N/A Temperature: Debridement N/A N/A Procedures Performed: Treatment Notes Electronic Signature(s) Signed: 04/30/2022 1:28:32 PM By: [name] MD FACS Entered By: [name] on 04/30/2022  13:28:32 -------------------------------------------------------------------------------- Multi-Disciplinary Care Plan Details Patient Name: Date of Service: [name], [name] 04/30/2022 12:30 PM Medical Record Number: [id number] Patient Account Number: [id number] Date of Birth/Sex: Treating RN: 10-Jan-1971 (51 y.o. [name] Primary Care Jaselle Pryer: [name] Other Clinician: Referring Carsyn Taubman: Treating Toluwani Yadav/Extender: [name], [name] Weeks in Treatment: 0 Active Inactive Wound/Skin Impairment Nursing Diagnoses: Impaired tissue integrity Goals: Patient/caregiver will verbalize understanding of skin care regimen Date Initiated: 04/24/2022 Target Resolution Date: 07/05/2022 Goal Status: Active Interventions: Assess ulceration(s) every visit Treatment Activities: Skin care regimen initiated : 04/24/2022 Notes: Electronic Signature(s) Signed: 04/30/2022 5:21:53 PM By: Dellie Catholic RN Entered By: Dellie Catholic on 04/30/2022 17:20:29 [name] ([id number]) [id number] Page 4 of 7 -------------------------------------------------------------------------------- Pain Assessment Details Patient Name: Date of Service: [name] [name] 04/30/2022 12:30 PM Medical Record Number: [id number] Patient Account Number: [id number] Date of Birth/Sex: Treating RN: [birth year] (51 y.o. [name] Primary Care Wilson Dusenbery: [name] Other Clinician: Referring Kileen Lange: Treating Omarri Eich/Extender: [name], [name] Weeks in Treatment: 0 Active Problems Location of Pain Severity and Description of Pain Patient Has Paino Yes Site Locations Pain Location: Generalized Pain With Dressing Change: Yes Duration of the Pain. Constant / Intermittento Constant Rate the pain. Current Pain Level: 3 Worst Pain Level: 10 Least Pain Level: 3 Tolerable Pain Level: 3 Character of Pain Describe the Pain:  Difficult to Pinpoint Pain Management and Medication Current Pain Management: Medication: Yes Cold Application: No Rest: Yes Massage: No Activity: No T.E.N.S.: No Heat Application: No Leg drop or elevation: No Is the Current Pain Management Adequate: Adequate Electronic Signature(s) Signed: 04/30/2022 5:21:53 PM By: Dellie Catholic RN Entered By: Dellie Catholic on 04/30/2022 12:51:04 -------------------------------------------------------------------------------- Patient/Caregiver Education Details Patient Name: Date of Service: [name] 10/26/2023andnbsp12:30 PM Medical Record Number: [id number] Patient Account Number: [id number] Date of Birth/Gender: Treating RN: July 22, 1970 (51 y.o. [name] Primary Care Physician: [name] Other Clinician: Referring Physician: Treating Physician/Extender: Shea Stakes in Treatment: 0 Education [address] [name], [name] ([id number]) [id number] Page 5 of 7 Education Provided To: Patient Education Topics Provided Wound/Skin Impairment: Methods: Explain/Verbal Responses: Return demonstration correctly Electronic Signature(s) Signed: 04/30/2022 5:21:53 PM By: Dellie Catholic RN Entered By: Dellie Catholic on 04/30/2022 17:20:42 -------------------------------------------------------------------------------- Wound Assessment Details Patient Name: Date of Service: [name] 04/30/2022 12:30 PM Medical Record Number: [id number] Patient Account Number: [id number] Date of Birth/Sex: Treating RN: 08-Aug-1970 (51 y.o. [name] Primary Care Marithza Malachi: [name] Other Clinician: Referring Yariel Ferraris: Treating  [name]/Extender: [name], [name] Weeks in Treatment: 0 Wound Status Wound Number: 1 Primary Etiology: Malignant Wound Wound Location: Left Breast Wound Status: Open Wounding Event: Surgical Injury Comorbid History:  Lymphedema Date Acquired: 04/03/2022 Weeks Of Treatment: 0 Clustered Wound: No Photos Wound Measurements Length: (cm) 3.1 Width: (cm) 5.2 Depth: (cm) 0.4 Area: (cm) 12.661 Volume: (cm) 5.064 % Reduction in Area: 4.9% % Reduction in Volume: 5% Epithelialization: None Tunneling: No Undermining: No Wound Description Classification: Full Thickness Without Exposed Su Exudate Amount: Medium Exudate Type: Serosanguineous Exudate Color: red, brown pport Structures Wound Bed Granulation Amount: Medium (34-66%) Exposed Structure Granulation Quality: Red Fascia Exposed: No Necrotic Amount: Medium (34-66%) Fat Layer (Subcutaneous Tissue) Exposed: Yes Necrotic Quality: Adherent Slough Tendon Exposed: No Muscle Exposed: No Joint Exposed: No Bone Exposed: No [name], [name] ([id number]) [id number] Page 6 of 7 Periwound Skin Texture Texture Color No Abnormalities Noted: Yes No Abnormalities Noted: Yes Moisture Temperature / Pain No Abnormalities Noted: Yes Temperature: No Abnormality Treatment Notes Wound #1 (Breast) Wound Laterality: Left Cleanser Normal Saline Discharge Instruction: Cleanse the wound with Normal Saline prior to applying a clean dressing using gauze sponges, not tissue or cotton balls. Soap and Water Discharge Instruction: May shower and wash wound with dial antibacterial soap and water prior to dressing change. [employer] Ancillary Kit - 15 Day Supply Discharge Instruction: Use supplies as instructed; Kit contains: (15) Saline Bullets; (15) 3x3 Gauze; 15 pr Gloves Peri-Wound Care Topical Primary Dressing Santyl Ointment Discharge Instruction: Apply nickel thick amount to wound bed as instructed Secondary Dressing ABD Pad, 5x9 Discharge Instruction: Apply over primary dressing as directed. Woven Gauze Sponge, Non-Sterile 4x4 in Discharge Instruction: Apply saline moistened gauze over the Santyl in the wound bed Secured With [employer] T 2x10 (in/yd) ape Discharge Instruction: Secure with tape as directed. Compression Wrap Compression Stockings Add-Ons Electronic Signature(s) Signed: 04/30/2022 5:21:53 PM By: Dellie Catholic RN Entered By: Dellie Catholic on 04/30/2022 13:07:15 -------------------------------------------------------------------------------- Vitals Details Patient Name: Date of Service: [name], [name] 04/30/2022 12:30 PM Medical Record Number: [id number] Patient Account Number: [id number] Date of Birth/Sex: Treating RN: [birth year] (51 y.o. [name] Primary Care Takeia Ciaravino: [name] Other Clinician: Referring Lonzie Simmer: Treating [name]/Extender: [name], [name] Weeks in Treatment: 0 Vital Signs Time Taken: 12:48 Temperature (F): 98.5 Height (in): 63 Pulse (bpm): 96 Weight (lbs): 170 Respiratory Rate (breaths/min): 16 Body Mass Index (BMI): 30.1 Blood Pressure (mmHg): 122/83 Reference Range: 80 - 120 mg / dl Electronic Signature(s) RHYLAN, [name] ([id number]) [id number] Page 7 of 7 Signed: 04/30/2022 5:21:53 PM By: Dellie Catholic RN Entered By: Dellie Catholic on 04/30/2022 12:50:15

## 2022-05-01 NOTE — Progress Notes (Signed)
[name], [name] ([id number]) [id number] Page 1 of 8 Visit Report for 04/30/2022 Chief Complaint Document Details Patient Name: Date of Service: [name] 04/30/2022 12:30 PM Medical Record Number: [id number] Patient Account Number: [id number] Date of Birth/Sex: Treating RN: 06-19-1971 (51 y.o. F) Primary Care Provider: Early [name] Other Clinician: Referring Provider: Treating Provider/Extender: [name], [name] in Treatment: 0 Information Obtained from: Patient Chief Complaint 04/24/2022; patient is here for a surgical wound on her left lateral breast Electronic Signature(s) Signed: 04/30/2022 1:28:40 PM By: [name] Maudlin MD FACS Entered By: [name] on 04/30/2022 13:28:40 -------------------------------------------------------------------------------- Debridement Details Patient Name: Date of Service: [name] 04/30/2022 12:30 PM Medical Record Number: [id number] Patient Account Number: [id number] Date of Birth/Sex: Treating RN: 1971/03/01 (51 y.o. [name] Primary Care Provider: Early [name] Other Clinician: Referring Provider: Treating Provider/Extender: [name], [name] in Treatment: 0 Debridement Performed for Assessment: Wound #1 Left Breast Performed By: Physician [name] Maudlin, MD Debridement Type: Debridement Level of Consciousness (Pre-procedure): Awake and Alert Pre-procedure Verification/Time Out Yes - 13:13 Taken: Start Time: 13:13 Pain Control: Lidocaine 4% T opical Solution T Area Debrided (L x W): otal 3.1 (cm) x 5.2 (cm) = 16.12 (cm) Tissue and other material debrided: Non-Viable, Slough, Subcutaneous, [place], Other: Necrotic fat Level: Skin/Subcutaneous Tissue Debridement Description: Excisional Instrument: Curette Bleeding: Minimum Hemostasis Achieved: Pressure End Time: 13:15 Procedural Pain: 0 Post Procedural Pain: 0 Response  to Treatment: Procedure was tolerated well Level of Consciousness (Post- Awake and Alert procedure): Post Debridement Measurements of Total Wound Length: (cm) 3.1 Width: (cm) 5.2 Depth: (cm) 0.4 Volume: (cm) 5.064 Character of Wound/Ulcer Post Debridement: Improved Post Procedure Diagnosis [name], [name] ([id number]) [id number] Page 2 of 8 Same as Pre-procedure Notes Scribed for Dr. Celine [name] by [name] Electronic Signature(s) Signed: 04/30/2022 5:21:53 PM By: [name] Catholic RN Signed: 05/01/2022 8:01:11 AM By: [name] Maudlin MD FACS Entered By: [name] on 04/30/2022 13:20:39 -------------------------------------------------------------------------------- HPI Details Patient Name: Date of Service: [name], [name] Bushy NIE W. 04/30/2022 12:30 PM Medical Record Number: [id number] Patient Account Number: [id number] Date of Birth/Sex: Treating RN: 12/19/1970 (51 y.o. F) Primary Care Provider: Early [name] Other Clinician: Referring Provider: Treating Provider/Extender: [name], [name] in Treatment: 0 History of Present Illness HPI Description: ADMISSION 05/25/2022 This is a 51 year old woman who is found to have an abnormal mammogram of her left breast earlier this year showing a 13 mm group of calcifications in the upper quadrant of the left breast. 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 She underwent a left lumpectomy on 11/12/2021. The patient states the wound never really healed and was open. She underwent radiation therapy from 12/10/2021 through 01/19/22 28 fractions of 1.8GY. Essentially the wound would not heal. She was treated several times with antibiotics finally on 03/25/2022 she was taken to the OR by [name] for excision of the wound in a sinus. Operative cultures grew strep and Prevotella and a culture from the clinic Livedo MRSA. It  was recommended for 4 [name] of Augmentin and doxycycline by infectious disease. She was seen by wound care and she has been treating the wound with Medihoney ever since. 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 She was on meth methotrexate 20 mg once a week however this I think was put on hold because of the wound followed by [name] of rheumatology. She has asthma. Most recent breast ultrasound was on 9/7 She has a large nonhealing wound on the lateral aspect of her left breast 04/30/2022: Although I do not have a photograph to compare to last week, the RN report is that it is substantially cleaner. She still has ample slough and nonviable fat and subcutaneous tissue present. She only was able to get her Santyl on Monday so she has just had a couple of days of treatment. Electronic Signature(s) Signed: 04/30/2022 1:29:14 PM By: [name] Maudlin MD FACS Entered By: [name] on 04/30/2022 13:29:14 -------------------------------------------------------------------------------- Physical Exam Details Patient Name: Date of Service: [name] 04/30/2022 12:30 PM Medical Record Number: [id number] Patient Account Number: [id number] Date of Birth/Sex: Treating RN: 12/13/1970 (51 y.o. F) Primary Care Provider: Early [name] Other Clinician: Referring Provider: Treating Provider/Extender: [name], [name] in Treatment: 0 Constitutional . . . . No acute distress.Marland Kitchen Respiratory Normal work of breathing on room air.Marland Kitchen [name], [name] ([id number]) [id number] Page 3 of 8 Notes 04/30/2022: Based on prior descriptions, the wound is much cleaner this week. There is still an area of undermining  from about 9-11 o'clock. No malodor or purulent drainage. Obvious skin changes secondary to radiation. Electronic Signature(s) Signed: 04/30/2022 1:31:45 PM By: [name] Maudlin MD FACS Entered By: [name] on 04/30/2022 13:31:45 -------------------------------------------------------------------------------- Physician Orders Details Patient Name: Date of Service: [name] 04/30/2022 12:30 PM Medical Record Number: [id number] Patient Account Number: [id number] Date of Birth/Sex: Treating RN: 11/09/1970 (51 y.o. [name] Primary Care Provider: Early [name] Other Clinician: Referring Provider: Treating Provider/Extender: [name], [name] in Treatment: 0 Verbal / Phone Orders: No Diagnosis Coding ICD-10 Coding Code Description T81.31XD Disruption of external operation (surgical) wound, not elsewhere classified, subsequent encounter 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 S21.002D Unspecified open wound of left breast, subsequent encounter 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 Follow-up Appointments ppointment in 1 week. - Dr. Celine [name] Room 3 Friday Nov.3rd at 12:30pm Return A Anesthetic (In clinic) Topical Lidocaine 4% applied to wound bed - used in Clinic Bathing/ Shower/ Hygiene Other Bathing/Shower/Hygiene Orders/Instructions: - Change dressing after bathing Edema Control - Lymphedema / SCD / Other Other Edema Control Orders/Instructions: - Keep doing the Lymphadema checks Wound Treatment Wound #1 - Breast Wound Laterality: Left Cleanser: Normal Saline (Generic) 1 x Per Day/30 Days Discharge Instructions: Cleanse the wound with Normal Saline prior to applying a clean dressing using gauze sponges, not tissue or cotton balls. Cleanser: Soap and Water 1 x Per Day/30 Days Discharge Instructions: May shower and wash wound with dial antibacterial soap and water prior to  dressing change. Cleanser: Byram Ancillary Kit - 15 Day Supply (Generic) 1 x Per Day/30 Days Discharge Instructions: Use supplies as instructed; Kit contains: (15) Saline Bullets; (15) 3x3 Gauze; 15 pr Gloves Prim Dressing: Santyl Ointment (Generic) 1 x Per Day/30 Days ary Discharge Instructions: Apply nickel thick amount to wound bed as instructed Secondary Dressing: ABD Pad, 5x9 (Generic) 1 x Per Day/30 Days Discharge Instructions: Apply over primary dressing as directed. Secondary Dressing: Woven Gauze Sponge, Non-Sterile 4x4 in (Generic) 1 x Per Day/30 Days Discharge Instructions: Apply saline moistened gauze over the Santyl in the wound bed Secured With: Sunrise Surgical T  2x10 (in/yd) (Generic) 1 x Per Day/30 Days ape Discharge Instructions: Secure with tape as directed. Electronic Signature(s) Signed: 05/01/2022 8:01:11 AM By: [name] Maudlin MD FACS [name], [name] ([id number]) By: [name] Maudlin MD FACS [phone].pdf Page 4 of 8 Signed: 05/01/2022 8:01:11 AM Entered By: [name] on 04/30/2022 13:32:09 -------------------------------------------------------------------------------- Problem List Details Patient Name: Date of Service: [name] 04/30/2022 12:30 PM Medical Record Number: [id number] Patient Account Number: [id number] Date of Birth/Sex: Treating RN: 1970-09-04 (51 y.o. F) Primary Care Provider: Early [name] Other Clinician: Referring Provider: Treating Provider/Extender: [name], [name] in Treatment: 0 Active Problems ICD-10 Encounter Code Description Active Date MDM Diagnosis T81.31XD Disruption of external operation (surgical) wound, not elsewhere classified, 04/24/2022 No Yes subsequent encounter L59.8 Other specified disorders of the skin and subcutaneous tissue related to 04/24/2022 No Yes radiation S21.002D Unspecified open wound of left breast, subsequent encounter  04/24/2022 No Yes 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 04/24/2022 No Yes Inactive Problems Resolved Problems Electronic Signature(s) Signed: 04/30/2022 1:28:11 PM By: [name] Maudlin MD FACS Entered By: [name] on 04/30/2022 13:28:11 -------------------------------------------------------------------------------- Progress Note Details Patient Name: Date of Service: [name] [name] 04/30/2022 12:30 PM Medical Record Number: [id number] Patient Account Number: [id number] Date of Birth/Sex: Treating RN: [birth year] (51 y.o. F) Primary Care Provider: Early [name] Other Clinician: Referring Provider: Treating Provider/Extender: [name], [name] in Treatment: 0 Subjective Chief Complaint Information obtained from Patient 04/24/2022; patient is here for a surgical wound on her left lateral breast [name], [name] ([id number]) [id number] Page 5 of 8 History of Present Illness (HPI) ADMISSION 05/25/2022 This is a 51 year old woman who is found to have an abnormal mammogram of her left breast earlier this year showing a 13 mm group of calcifications in the upper quadrant of the left breast. 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 She underwent a left lumpectomy on 11/12/2021. The patient states the wound never really healed and was open. She underwent radiation therapy from 12/10/2021 through 01/19/22 28 fractions of 1.8GY. Essentially the wound would not heal. She was treated several times with antibiotics finally on 03/25/2022 she was taken to the OR by [name] for excision of the wound in a sinus. Operative cultures grew strep and Prevotella and a culture from the clinic Livedo MRSA. It was recommended for 4 [name] of Augmentin and doxycycline by infectious disease. She was seen by wound care and she has been treating the wound  with Medihoney ever since. 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 She was on meth methotrexate 20 mg once a week however this I think was put on hold because of the wound followed by [name] of rheumatology. She has asthma. Most recent breast ultrasound was on 9/7 She has a large nonhealing wound on the lateral aspect of her left breast 04/30/2022: Although I do not have a photograph to compare to last week, the RN report is that it is substantially cleaner. She still has ample slough and nonviable fat and subcutaneous tissue present. She only was able to get her Santyl on Monday so she has just had a couple of days of treatment. Patient History Information obtained from Patient. Social  History Never smoker, Marital Status - Married, Alcohol Use - Moderate, Drug Use - No History, Caffeine Use - Daily. Medical History Hematologic/Lymphatic Patient has history of Lymphedema - Left breast-gets Lymphadema tx Hospitalization/Surgery History - 03/25/22 Left Breast cyst excision; 11/12/21 Left Breast Lumpectomy with Radioactive seeds;Left breat Biopsy;Anterior. 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 12/08/21- 01/29/22- 37 rounds of Radiation Psychiatric General Anxiety Objective Constitutional No acute distress.. Vitals Time Taken: 12:48 PM, Height: 63 in, Weight: 170 lbs, BMI: 30.1, Temperature: 98.5 F, Pulse: 96 bpm, Respiratory Rate: 16 breaths/min, Blood Pressure: 122/83 mmHg. Respiratory Normal work of breathing on room air.. General Notes: 04/30/2022: Based on  prior descriptions, the wound is much cleaner this week. There is still an area of undermining from about 9-11 o'clock. No malodor or purulent drainage. Obvious skin changes secondary to radiation. Integumentary (Hair, Skin) Wound #1 status is Open. Original cause of wound was Surgical Injury. The date acquired was: 04/03/2022. The wound is located on the Left Breast. The wound measures 3.1cm length x 5.2cm width x 0.4cm depth; 12.661cm^2 area and 5.064cm^3 volume. There is Fat Layer (Subcutaneous Tissue) exposed. There is no tunneling or undermining noted. There is a medium amount of serosanguineous drainage noted. There is medium (34-66%) red granulation within the wound bed. There is a medium (34-66%) amount of necrotic tissue within the wound bed including Adherent Slough. The periwound skin appearance had no abnormalities noted for texture. The periwound skin appearance had no abnormalities noted for moisture. The periwound skin appearance had no abnormalities noted for color. Periwound temperature was noted as No Abnormality. Assessment [address] AYRABELLA, LABOMBARD ([id number]) [id number] Page 6 of 8 ICD-10 Disruption of external operation (surgical) wound, not elsewhere classified, subsequent encounter Other specified disorders of the skin and subcutaneous tissue related to radiation Unspecified open wound of left breast, subsequent encounter Intraductal carcinoma in situ of left breast Procedures Wound #1 Pre-procedure diagnosis of Wound #1 is a Malignant Wound located on the Left Breast . There was a Excisional Skin/Subcutaneous Tissue Debridement with a total area of 16.12 sq cm performed by [name] Maudlin, MD. With the following instrument(s): Curette to remove Non-Viable tissue/material. Material removed includes Subcutaneous Tissue, Slough, and Other: Necrotic fat after achieving pain control using Lidocaine 4% T opical Solution. No specimens  were taken. A time out was conducted at 13:13, prior to the start of the procedure. A Minimum amount of bleeding was controlled with Pressure. The procedure was tolerated well with a pain level of 0 throughout and a pain level of 0 following the procedure. Post Debridement Measurements: 3.1cm length x 5.2cm width x 0.4cm depth; 5.064cm^3 volume. Character of Wound/Ulcer Post Debridement is improved. Post procedure Diagnosis Wound #1: Same as Pre-Procedure General Notes: Scribed for Dr. Celine [name] by [name] Plan Follow-up Appointments: Return Appointment in 1 week. - Dr. Celine [name] Room 3 Friday Nov.3rd at 12:30pm Anesthetic: (In clinic) Topical Lidocaine 4% applied to wound bed - used in Clinic Bathing/ Shower/ Hygiene: Other Bathing/Shower/Hygiene Orders/Instructions: - Change dressing after bathing Edema Control - Lymphedema / SCD / Other: Other Edema Control Orders/Instructions: - Keep doing the Lymphadema checks WOUND #1: - Breast Wound Laterality: Left Cleanser: Normal Saline (Generic) 1 x Per Day/30 Days Discharge Instructions: Cleanse the wound with Normal Saline prior to applying a clean dressing using gauze sponges, not tissue or cotton balls. Cleanser: Soap and Water 1 x Per  Day/30 Days Discharge Instructions: May shower and wash wound with dial antibacterial soap and water prior to dressing change. Cleanser: Byram Ancillary Kit - 15 Day Supply (Generic) 1 x Per Day/30 Days Discharge Instructions: Use supplies as instructed; Kit contains: (15) Saline Bullets; (15) 3x3 Gauze; 15 pr Gloves Prim Dressing: Santyl Ointment (Generic) 1 x Per Day/30 Days ary Discharge Instructions: Apply nickel thick amount to wound bed as instructed Secondary Dressing: ABD Pad, 5x9 (Generic) 1 x Per Day/30 Days Discharge Instructions: Apply over primary dressing as directed. Secondary Dressing: Woven Gauze Sponge, Non-Sterile 4x4 in (Generic) 1 x Per Day/30 Days Discharge Instructions: Apply saline  moistened gauze over the Santyl in the wound bed Secured With: 41M Medipore Soft Cloth Surgical T 2x10 (in/yd) (Generic) 1 x Per Day/30 Days ape Discharge Instructions: Secure with tape as directed. 04/30/2022: Based on prior descriptions, the wound is much cleaner this week. There is still an area of undermining from about 9-11 o'clock. No malodor or purulent drainage. Obvious skin changes secondary to radiation. I used a curette to debride slough, necrotic fat, and nonviable subcutaneous tissue from the wound. We will continue to use Santyl daily dressing changes for now. Once the wound is a bit cleaner, we can consider a wound VAC, with the ultimate goal to be applying a skin substitute. Although she is interested in hyperbaric oxygen therapy, it has not been 6 months since the completion of radiation and therefore unlikely to be covered by insurance. I am hopeful that we will have her wound healed prior to the time that she would be eligible. Follow-up in 1 week. Electronic Signature(s) Signed: 04/30/2022 1:33:07 PM By: [name] Maudlin MD FACS Entered By: [name] on 04/30/2022 13:33:07 -------------------------------------------------------------------------------- HxROS Details Patient Name: Date of Service: [name], [name] Bushy NIE W. 04/30/2022 12:30 PM Medical Record Number: [id number] Patient Account Number: [id number] Date of Birth/Sex: Treating RN: 1970-07-26 (51 y.o. [name], [name] ([id number]) [id number] Page 7 of 8 Primary Care Provider: Early [name] Other Clinician: Referring Provider: Treating Provider/Extender: [name], [name] in Treatment: 0 Information Obtained From Patient Ear/Nose/Mouth/Throat Medical History: Past Medical History Notes: Left ear limited hearing Hematologic/Lymphatic Medical History: Positive for: Lymphedema - Left breast-gets Lymphadema tx Gastrointestinal Medical History: Past  Medical History Notes: GERD Immunological Medical History: Past Medical History Notes: Wegener"s Granulomatosis Musculoskeletal Medical History: Past Medical History Notes: "RA like arthritis" as per patient Oncologic Medical History: Past Medical History Notes: Left Breast. 12/08/21- 01/29/22- 37 rounds of Radiation Psychiatric Medical History: Past Medical History Notes: General Anxiety Immunizations Pneumococcal Vaccine: Received Pneumococcal Vaccination: Yes Received Pneumococcal Vaccination On or After 60th Birthday: No Implantable Devices Yes Hospitalization / Surgery History Type of Hospitalization/Surgery 03/25/22 Left Breast cyst excision; 11/12/21 Left Breast Lumpectomy with Radioactive seeds;Left breat Biopsy;Anterior Family and Social History Never smoker; Marital Status - Married; Alcohol Use: Moderate; Drug Use: No History; Caffeine Use: Daily; Financial Concerns: No; Food, Clothing or Shelter Needs: No; Support System Lacking: No; Transportation Concerns: No Electronic Signature(s) Signed: 05/01/2022 8:01:11 AM By: [name] Maudlin MD FACS Entered By: [name] on 04/30/2022 13:30:19 [name] ([id number]) [id number] Page 8 of 8 -------------------------------------------------------------------------------- SuperBill Details Patient Name: Date of Service: [name] 04/30/2022 Medical Record Number: [id number] Patient Account Number: [id number] Date of Birth/Sex: Treating RN: [birth year] (51 y.o. F) Primary Care Provider: Early [name] Other Clinician: Referring Provider: Treating Provider/Extender: [name], [name] in Treatment: 0 Diagnosis Coding ICD-10 Codes Code Description T81.31XD Disruption of external  operation (surgical) wound, not elsewhere classified, subsequent encounter 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 S21.002D  Unspecified open wound of left breast, subsequent encounter 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 Facility Procedures : CPT4 Code: [id number] Description: 11042 - DEB SUBQ TISSUE 20 SQ CM/< ICD-10 Diagnosis Description L59.8 Other specified disorders of the skin and subcutaneous tissue related to radiatio S21.002D Unspecified open wound of left breast, subsequent encounter T81.31XD Disruption  of external operation (surgical) wound, not elsewhere classified, subs Modifier: n equent encounter Quantity: 1 Physician Procedures : CPT4 Code Description Modifier [id number] - WC PHYS LEVEL 4 - EST PT 25 ICD-10 Diagnosis Description 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 T81.31XD Disruption of external operation (surgical) wound,  not elsewhere classified, subsequent encounter S21.002D Unspecified open wound of left breast, subsequent encounter 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 Quantity: 1 : [id number] 11042 - WC PHYS SUBQ TISS 20 SQ CM ICD-10 Diagnosis Description 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 S21.002D Unspecified open wound of left breast, subsequent encounter T81.31XD Disruption of  external operation (surgical) wound, not elsewhere classified, subsequent encounter Quantity: 1 Electronic Signature(s) Signed: 04/30/2022 1:33:33 PM By: [name] Maudlin MD FACS Entered By: [name] on 04/30/2022 13:33:33

## 2022-05-03 ENCOUNTER — Other Ambulatory Visit (HOSPITAL_COMMUNITY): Payer: Self-pay

## 2022-05-04 ENCOUNTER — Encounter: Payer: Self-pay | Admitting: Oncology

## 2022-05-04 ENCOUNTER — Other Ambulatory Visit (HOSPITAL_COMMUNITY): Payer: Self-pay

## 2022-05-04 ENCOUNTER — Inpatient Hospital Stay: Payer: 59 | Attending: Oncology | Admitting: Oncology

## 2022-05-04 DIAGNOSIS — D0512 Intraductal carcinoma in situ of left breast: Secondary | ICD-10-CM | POA: Diagnosis not present

## 2022-05-04 DIAGNOSIS — Z780 Asymptomatic menopausal state: Secondary | ICD-10-CM

## 2022-05-04 MED ORDER — INFLUENZA VAC SPLIT QUAD 0.5 ML IM SUSY
PREFILLED_SYRINGE | INTRAMUSCULAR | 0 refills | Status: DC
  Filled 2022-05-04: qty 0.5, 1d supply, fill #0

## 2022-05-04 MED ORDER — PANTOPRAZOLE SODIUM 40 MG PO TBEC
40.0000 mg | DELAYED_RELEASE_TABLET | Freq: Two times a day (BID) | ORAL | 1 refills | Status: DC
  Filled 2022-05-04 – 2022-06-02 (×2): qty 180, 90d supply, fill #0
  Filled 2022-06-15 – 2022-09-10 (×2): qty 180, 90d supply, fill #1

## 2022-05-04 NOTE — Progress Notes (Signed)
Patient called/ pre- screened for virtual appoinment reports recent hospitalization, requests another radiation referral

## 2022-05-04 NOTE — Progress Notes (Signed)
[facility]  [phone] Fax:([phone]) [phone]  ID: SYRENITY KLEPACKI OB: September 04, 1970  MR#: [id number]  GDJ#:[id number]  Patient Care Team: Mckinley Jewel, MD as PCP - General (Internal Medicine)  I connected with [name] on 05/04/22 at 11:00 AM EDT by video enabled telemedicine visit and verified that I am speaking with the correct person using two identifiers.   I discussed the limitations, risks, security and privacy concerns of performing an evaluation and management service by telemedicine and the availability of in-person appointments. I also discussed with the patient that there may be a patient responsible charge related to this service. The patient expressed understanding and agreed to proceed.   Other persons participating in the visit and their role in the encounter: Patient, MD.  Patient's location: Home. Provider's location: Clinic.  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  INTERVAL HISTORY: Patient agreed to video assisted telemedicine visit for her routine 37-monthevaluation.  She continues to have appointments in the wound center every week.  She reports she continues to have a open wound.  She has been able to restart work on a limited basis last week.  She otherwise feels well.  She is tolerating letrozole without significant side effects.  She has no neurologic complaints.  She denies any recent fevers or illnesses.  She has a good appetite and denies weight loss.  She has no chest pain, shortness of breath, cough, or hemoptysis.  She denies any nausea, vomiting, constipation, or diarrhea.  She has no urinary complaints.  Patient offers no further specific complaints.  REVIEW OF SYSTEMS:   Review of Systems  Constitutional: Negative.  Negative for fever, malaise/fatigue and weight loss.  Respiratory: Negative.  Negative for cough, hemoptysis and shortness of breath.   Cardiovascular:  Negative for  chest pain and leg swelling.  Gastrointestinal: Negative.  Negative for abdominal pain.  Genitourinary: Negative.  Negative for dysuria.  Musculoskeletal: Negative.  Negative for back pain.  Skin: Negative.  Negative for rash.  Neurological: Negative.  Negative for dizziness, focal weakness, weakness and headaches.  Psychiatric/Behavioral: Negative.  The patient is not nervous/anxious.     As per HPI. Otherwise, a complete review of systems is negative.  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    [facility]---  dx 2015,  takes methotrexate wkly, pt stated she is stable    PAST SURGICAL HISTORY: Past Surgical History:  Procedure Laterality Date   ANTERIOR AND POSTERIOR REPAIR N/A 01/14/2021   Procedure: ANTERIOR (CYSTOCELE) AND POSTERIOR REPAIR (RECTOCELE);  Surgeon: MCheri Fowler MD;  [facility]  Service: Gynecology;  Laterality: N/A;   AUGMENTATION MAMMAPLASTY Bilateral 2010   saline   BREAST BIOPSY Left 10/23/2021   Stereo Bx, X-clip, Ductal carcinoma in situ   BREAST CYST EXCISION Left 03/25/2022   Procedure: EXCISION OF CHRONIC LEFT  BREAST WOUND;  Surgeon: Stark Klein, MD;  Location: [place];  Service: General;  Laterality: Left;   BREAST ENHANCEMENT SURGERY  2009   BREAST LUMPECTOMY     BREAST LUMPECTOMY WITH RADIOACTIVE SEED AND SENTINEL LYMPH NODE BIOPSY Left 11/12/2021   Procedure: LEFT  BREAST LUMPECTOMY WITH RADIOACTIVE SEED AND SENTINEL LYMPH NODE BIOPSY;  Surgeon: Stark Klein, MD;  Location: [place];  Service: General;  Laterality: Left;   CARPAL TUNNEL RELEASE Right 2011   LAPAROSCOPIC SUPRACERVICAL HYSTERECTOMY  02/21/2010   @ [place] by Dr Willis Modena   NASAL SEPTUM SURGERY  01/2018   insertion button prosthesis for septal perforation   RHINOPLASTY  2005   nasal fracture    FAMILY HISTORY: Family History  Problem Relation Age of Onset   Asthma Mother    Depression Mother    Diabetes Mother    Hyperlipidemia Mother    Hypertension Mother    Mental illness Mother    Arthritis Mother    Pancreatic cancer Mother    Heart disease Father 19       AMI/CABG   Mental illness Sister    Alzheimer's disease Maternal Grandmother    Hyperlipidemia Maternal Grandmother    Hypertension Maternal Grandmother    Heart attack Maternal Grandfather    Lung cancer Maternal Grandfather    Myasthenia gravis Daughter    Charcot-Marie-Tooth disease Daughter    ADD / ADHD Daughter    Pancreatic cancer Maternal Aunt     ADVANCED DIRECTIVES (Y/N):  N  HEALTH MAINTENANCE: Social History   Tobacco Use   Smoking status: Never   Smokeless tobacco: Never  Vaping Use   Vaping Use: Never used  Substance Use Topics   Alcohol use: Yes    Comment: occasional   Drug use: Never     Colonoscopy:  PAP:  Bone density:  Lipid panel:  Allergies  Allergen Reactions   Kiwi Extract Shortness Of Breath and Swelling   Ativan [Lorazepam] Other (See Comments)    Hyperactivity per patient   Erythromycin Base Nausea And Vomiting   Nitrofurantoin Nausea And Vomiting   Other     Latex self dissolving sutures "work their way out"   Xanax [Alprazolam] Other (See Comments)    Makes patient hyperactive     Current Outpatient Medications  Medication Sig Dispense Refill   Acetaminophen (TYLENOL PO) Take by mouth as needed.     ADVAIR DISKUS 100-50 MCG/DOSE AEPB INHALE 1  PUFF INTO THE LUNGS TWICE DAILY 1 each 0   albuterol (VENTOLIN HFA) 108 (90 Base) MCG/ACT inhaler Inhale 2 puffs into the lungs every 6 (six) hours as needed for wheezing or shortness of breath (cough, shortness of breath or wheezing.). 1 each 1   amoxicillin-clavulanate (AUGMENTIN) 875-125 MG tablet Take 1 tablet by mouth every 12 (twelve) hours. Take 1 tablet every 12 hours for 4 weeks 56 tablet 0   collagenase (SANTYL) 250 UNIT/GM ointment Apply 1 application topically to wound daily. 30 g 3   doxycycline (VIBRA-TABS) 100 MG tablet Take 1 tablet (100 mg total) by mouth every 12 (twelve) hours for 28 days. 56 tablet 0   folic acid (FOLVITE) 1 MG tablet Take 1 tablet (1 mg total) by mouth once daily (Patient taking differently: Take 1 mg by mouth daily.) 90 tablet 3   gabapentin (NEURONTIN) 300 MG capsule Take 1 capsule (300 mg total) by mouth at bedtime (Patient taking differently: Take 300 mg by mouth daily as needed (  For pain).) 30 capsule 5   HONEY PO Apply topically.     HYDROcodone-acetaminophen (NORCO/VICODIN) 5-325 MG tablet Take 1 tablet by mouth every 6 (six) hours as needed for pain for up to 5 days 20 tablet 0   hydroxychloroquine (PLAQUENIL) 200 MG tablet Take 1 tablet (200 mg total) by mouth 2 (two) times daily (Patient taking differently: Take 200 mg by mouth daily.) 60 tablet 5   hydrOXYzine (VISTARIL) 25 MG capsule Take 1 capsule (25 mg total) by mouth daily as needed. (Patient taking differently: Take 25 mg by mouth daily.) 90 capsule 0   ibuprofen (ADVIL) 800 MG tablet Take 1 tablet (800 mg total) by mouth every 8 (eight) hours as needed. 30 tablet 1   leptospermum manuka honey (MEDIHONEY) PSTE paste Apply 1 Application topically daily. 44 mL 1   letrozole (FEMARA) 2.5 MG tablet Take 1 tablet (2.5 mg total) by mouth daily. 30 tablet 3   lidocaine-nystatin-hydrocortisone-diphenhydrAMINE Swish and spit 5 mLs by mouth every 6 (six) hours as needed 120 mL 0   montelukast (SINGULAIR) 10  MG tablet Take 1 tablet (10 mg total) by mouth daily. 30 tablet 6   pantoprazole (PROTONIX) 40 MG tablet TAKE ONE TABLET BY MOUTH TWICE A DAY BEFORE FOOD (Patient taking differently: Take 40 mg by mouth 2 (two) times daily.) 180 tablet 0   sertraline (ZOLOFT) 100 MG tablet Take 1.5 tablets (150 mg total) by mouth daily. (Patient taking differently: Take 100 mg by mouth daily.) 140 tablet 0   sucralfate (CARAFATE) 1 g tablet Take 1 g by mouth 2 (two) times daily as needed (Antacid).     ondansetron (ZOFRAN-ODT) 4 MG disintegrating tablet Take 1 tablet (4 mg total) by mouth every 6 (six) hours as needed for nausea. (Patient not taking: Reported on 04/24/2022) 20 tablet 0   No current facility-administered medications for this visit.    OBJECTIVE: There were no vitals filed for this visit.     There is no height or weight on file to calculate BMI.    ECOG FS:0 - Asymptomatic  General: Well-developed, well-nourished, no acute distress. HEENT: Normocephalic. Neuro: Alert, answering all questions appropriately. Cranial nerves grossly intact. Psych: Normal affect.  LAB RESULTS:  Lab Results  Component Value Date   NA 142 04/04/2022   K 4.4 04/04/2022   CL 112 (H) 04/04/2022   CO2 25 04/04/2022   GLUCOSE 119 (H) 04/04/2022   BUN 12 04/04/2022   CREATININE 0.66 04/04/2022   CALCIUM 8.5 (L) 04/04/2022   PROT 5.8 (L) 04/04/2022   ALBUMIN 2.9 (L) 04/04/2022   AST 14 (L) 04/04/2022   ALT 8 04/04/2022   ALKPHOS 50 04/04/2022   BILITOT 0.4 04/04/2022   GFRNONAA >60 04/04/2022   GFRAA 121 06/10/2016    Lab Results  Component Value Date   WBC 8.1 04/08/2022   NEUTROABS 8.6 (H) 04/03/2022   HGB 9.8 (L) 04/08/2022   HCT 30.0 (L) 04/08/2022   MCV 88.5 04/08/2022   PLT 248 04/08/2022     STUDIES: No results found.  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   Patient underwent lumpectomy on Nov 12, 2021.  She has had multiple postsurgical complications with wound infection requiring multiple procedures and is now being seen in the wound center weekly.  Patient attributes much of her ongoing issues to XRT.  She was placed  on letrozole given the focus of invasive carcinoma as well as an interaction of tamoxifen and Zoloft.  Continue treatment for a total of 5 years completing in July 2028.  Return to clinic in 3 months with video assisted telemedicine visit.  Genetics: Patient was tested in January 2022 and this was reported as negative.   Bone health: Patient will require baseline bone mineral density in the next 1 to 2 weeks. Breast wound: Continue follow-up with wound care center [facility] as scheduled.  Patient reports hyperbaric treatment was denied by her insurance carrier.  I provided 20 minutes of face-to-face video visit time during this encounter which included chart review, counseling, and coordination of care as documented above.    Patient expressed understanding and was in agreement with this plan. She also understands that She can call clinic at any time with any questions, concerns, or complaints.    Cancer Staging  Ductal carcinoma in situ (DCIS) of left breast Staging form: Breast, AJCC 8th Edition - Clinical: Stage IA (cT30m, cN0, cM0, G1, ER+, PR+, HER2-) - Signed by FLloyd Huger MD on 11/28/2021 Histologic grading system: 3 grade system   TLloyd Huger MD   05/04/2022 1:16 PM

## 2022-05-05 ENCOUNTER — Telehealth: Payer: 59 | Admitting: Oncology

## 2022-05-06 ENCOUNTER — Other Ambulatory Visit (HOSPITAL_COMMUNITY): Payer: Self-pay

## 2022-05-07 ENCOUNTER — Other Ambulatory Visit: Payer: Self-pay

## 2022-05-07 ENCOUNTER — Other Ambulatory Visit (HOSPITAL_COMMUNITY): Payer: Self-pay

## 2022-05-08 ENCOUNTER — Encounter (HOSPITAL_BASED_OUTPATIENT_CLINIC_OR_DEPARTMENT_OTHER): Payer: 59 | Attending: General Surgery | Admitting: General Surgery

## 2022-05-08 DIAGNOSIS — M313 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: Secondary | ICD-10-CM | POA: Diagnosis not present

## 2022-05-08 DIAGNOSIS — K219 Gastro-esophageal reflux disease without esophagitis: Secondary | ICD-10-CM | POA: Insufficient documentation

## 2022-05-08 DIAGNOSIS — J45909 Unspecified asthma, uncomplicated: Secondary | ICD-10-CM | POA: Insufficient documentation

## 2022-05-08 DIAGNOSIS — L98492 Non-pressure chronic ulcer of skin of other sites with fat layer exposed: Secondary | ICD-10-CM | POA: Diagnosis not present

## 2022-05-08 DIAGNOSIS — C50912 Malignant neoplasm of unspecified site of left female breast: Secondary | ICD-10-CM | POA: Diagnosis not present

## 2022-05-08 DIAGNOSIS — T8131XD Disruption of external operation (surgical) wound, not elsewhere classified, subsequent encounter: Secondary | ICD-10-CM | POA: Insufficient documentation

## 2022-05-08 NOTE — Progress Notes (Signed)
BROOKELLE, PELLICANE ([id number]) [id number] Page 1 of 8 Visit Report for 05/08/2022 Chief Complaint Document Details Patient Name: Date of Service: [name] Mile. 05/08/2022 1:15 PM Medical Record Number: [id number] Patient Account Number: [id number] Date of Birth/Sex: Treating RN: 20-Oct-1970 (51 y.o. [name] Primary Care Provider: [name] Other Clinician: Referring Provider: Treating Provider/Extender: [name], Tobin Chad in Treatment: 2 Information Obtained from: Patient Chief Complaint 04/24/2022; patient is here for a surgical wound on her left lateral breast Electronic Signature(s) Signed: 05/08/2022 1:58:36 PM By: [name] MD FACS Entered By: [name] on 05/08/2022 13:58:36 -------------------------------------------------------------------------------- Debridement Details Patient Name: Date of Service: [name] 05/08/2022 1:15 PM Medical Record Number: [id number] Patient Account Number: [id number] Date of Birth/Sex: Treating RN: [birth year] (51 y.o. [name] Primary Care Provider: [name] Other Clinician: Referring Provider: Treating Provider/Extender: Shea Stakes in Treatment: 2 Debridement Performed for Assessment: Wound #1 Left Breast Performed By: Physician [name], MD Debridement Type: Debridement Level of Consciousness (Pre-procedure): Awake and Alert Pre-procedure Verification/Time Out Yes - 13:45 Taken: Start Time: 13:45 Pain Control: Lidocaine 4% T opical Solution T Area Debrided (L x W): otal 3.2 (cm) x 5.1 (cm) = 16.32 (cm) Tissue and other material debrided: Non-Viable, Slough, Subcutaneous, Slough Level: Skin/Subcutaneous Tissue Debridement Description: Excisional Instrument: Curette Bleeding: Minimum Hemostasis Achieved: Pressure End Time: 13:46 Procedural Pain: 0 Post Procedural Pain: 0 Response to  Treatment: Procedure was tolerated well Level of Consciousness (Post- Awake and Alert procedure): Post Debridement Measurements of Total Wound Length: (cm) 3.2 Width: (cm) 5.1 Depth: (cm) 0.3 Volume: (cm) 3.845 Character of Wound/Ulcer Post Debridement: Improved Post Procedure Diagnosis RUNA, WHITTINGHAM ([id number]) [id number] Page 2 of 8 Same as Pre-procedure Notes Scribed for Dr. Celine Ahr by J.Scotton Electronic Signature(s) Signed: 05/08/2022 2:25:37 PM By: [name] MD FACS Signed: 05/08/2022 6:01:31 PM By: Dellie Catholic RN Entered By: Dellie Catholic on 05/08/2022 13:53:10 -------------------------------------------------------------------------------- HPI Details Patient Name: Date of Service: [name] RD, [place] W. 05/08/2022 1:15 PM Medical Record Number: [id number] Patient Account Number: [id number] Date of Birth/Sex: Treating RN: 1970-08-30 (51 y.o. [name] Primary Care Provider: [name] Other Clinician: Referring Provider: Treating Provider/Extender: [name], [name] Weeks in Treatment: 2 History of Present Illness HPI Description: ADMISSION 05/25/2022 This is a 51 year old woman who is found to have an abnormal mammogram of her left breast earlier this year showing a 13 mm group of calcifications in the upper quadrant of the left breast. 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 She underwent a left lumpectomy on 11/12/2021. The patient states the wound never really healed and was open. She underwent radiation therapy from 12/10/2021 through 01/19/22 28 fractions of 1.8GY. Essentially the wound would not heal. She was treated several times with antibiotics finally on 03/25/2022 she was taken to the OR by Dr. Barry Dienes for excision of the wound in a sinus. Operative cultures grew strep and Prevotella and a culture from the clinic Livedo  MRSA. It was recommended for 4 weeks of Augmentin and doxycycline by infectious disease. She was seen by wound care and she has been treating the wound with Medihoney ever since. 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 She was on meth methotrexate 20 mg once a week however this I think was put on hold because of the wound followed by Dr. Posey Pronto of rheumatology. She has asthma. Most recent breast ultrasound was on 9/7 She has a large nonhealing wound on the lateral aspect of her left breast 04/30/2022: Although I do not have a photograph to compare to last week, the RN report is that it is substantially cleaner. She still has ample slough and nonviable fat and subcutaneous tissue present. She only was able to get her Santyl on Monday so she has just had a couple of days of treatment. 06/04/2022: The wound dimensions are about the same, but the surface is substantially cleaner. There is just a little bit of slough accumulation on the surface. Electronic Signature(s) Signed: 05/08/2022 2:01:00 PM By: [name] MD FACS Entered By: [name] on 05/08/2022 14:01:00 -------------------------------------------------------------------------------- Physical Exam Details Patient Name: Date of Service: [name] 05/08/2022 1:15 PM Medical Record Number: [id number] Patient Account Number: [id number] Date of Birth/Sex: Treating RN: [birth year] (52 y.o. [name] Primary Care Provider: [name] Other Clinician: Referring Provider: Treating Provider/Extender: [name], [name] Weeks in Treatment: 2 Constitutional . . . . No acute distress.Marland Kitchen Respiratory KADISHA, GOODINE ([id number])  [id number] Page 3 of 8 Normal work of breathing on room air.. Notes 06/04/2022: The wound dimensions are about the same, but the surface is substantially cleaner. There is just a little bit of slough accumulation on the surface. Electronic Signature(s) Signed: 05/08/2022 2:01:28 PM By: [name] MD FACS Entered By: [name] on 05/08/2022 14:01:28 -------------------------------------------------------------------------------- Physician Orders Details Patient Name: Date of Service: [name] 05/08/2022 1:15 PM Medical Record Number: [id number] Patient Account Number: [id number] Date of Birth/Sex: Treating RN: [birth year] (51 y.o. [name] Primary Care Provider: [name] Other Clinician: Referring Provider: Treating Provider/Extender: Shea Stakes in Treatment: 2 Verbal / Phone Orders: No Diagnosis Coding ICD-10 Coding Code Description T81.31XD Disruption of external operation (surgical) wound, not elsewhere classified, subsequent encounter 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 S21.002D Unspecified open wound of left breast, subsequent encounter 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 Follow-up Appointments ppointment in 1 week. - Dr. Celine Ahr Room 3 Return A Anesthetic (In clinic) Topical Lidocaine 4% applied to wound bed - used in Clinic Bathing/ Shower/ Hygiene Other Bathing/Shower/Hygiene Orders/Instructions: - Change dressing after bathing Edema Control - Lymphedema / SCD / Other Other Edema Control Orders/Instructions: - Keep doing the Lymphadema checks Wound Treatment Wound #1 - Breast Wound Laterality: Left Cleanser: Normal Saline (Generic) 1 x Per Day/30 Days Discharge Instructions: Cleanse the wound with Normal Saline prior to applying a clean dressing using gauze sponges, not tissue or cotton balls. Cleanser: Soap and  Water 1 x Per Day/30 Days Discharge Instructions: May shower and wash wound with dial antibacterial soap and water prior to dressing change. Cleanser: Byram Ancillary Kit - 15 Day Supply (Generic) 1 x Per Day/30 Days Discharge Instructions: Use supplies as instructed; Kit contains: (15) Saline Bullets; (15) 3x3 Gauze; 15 pr Gloves Prim Dressing: Santyl Ointment (Generic) 1 x Per Day/30 Days ary Discharge Instructions: Apply nickel thick amount to wound bed as instructed Secondary Dressing: ABD Pad, 5x9 (Generic) 1 x Per Day/30 Days Discharge Instructions: Apply over primary dressing as directed. Secondary Dressing: Woven Gauze Sponge, Non-Sterile 4x4 in (Generic) 1 x Per Day/30 Days Discharge Instructions: Apply saline  moistened gauze over the Santyl in the wound bed Secured With: [place] [facility] T 2x10 (in/yd) (Generic) 1 x Per Day/30 Days ape Discharge Instructions: Secure with tape as directed. Electronic Signature(s) SHERL, YZAGUIRRE ([id number]) [id number] Page 4 of 8 Signed: 05/08/2022 2:25:37 PM By: [name] MD FACS Entered By: [name] on 05/08/2022 14:03:57 -------------------------------------------------------------------------------- Problem List Details Patient Name: Date of Service: [name] RD, [name] 05/08/2022 1:15 PM Medical Record Number: [id number] Patient Account Number: [id number] Date of Birth/Sex: Treating RN: 10/11/1970 (51 y.o. [name] Primary Care Provider: [name] Other Clinician: Referring Provider: Treating Provider/Extender: Shea Stakes in Treatment: 2 Active Problems ICD-10 Encounter Code Description Active Date MDM Diagnosis T81.31XD Disruption of external operation (surgical) wound, not elsewhere classified, 04/24/2022 No Yes subsequent encounter L59.8 Other specified disorders of the skin and subcutaneous tissue related to 04/24/2022 No  Yes radiation S21.002D Unspecified open wound of left breast, subsequent encounter 04/24/2022 No Yes 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 04/24/2022 No Yes Inactive Problems Resolved Problems Electronic Signature(s) Signed: 05/08/2022 1:46:21 PM By: [name] MD FACS Entered By: [name] on 05/08/2022 13:46:21 -------------------------------------------------------------------------------- Progress Note Details Patient Name: Date of Service: [name] [name] 05/08/2022 1:15 PM Medical Record Number: [id number] Patient Account Number: [id number] Date of Birth/Sex: Treating RN: 30-Jun-1971 (51 y.o. [name] Primary Care Provider: [name] Other Clinician: Referring Provider: Treating Provider/Extender: Shea Stakes in Treatment: 2 Subjective Chief Complaint Information obtained from Patient 04/24/2022; patient is here for a surgical wound on her left lateral breast VENNELA, JUTTE ([id number]) [id number] Page 5 of 8 History of Present Illness (HPI) ADMISSION 05/25/2022 This is a 51 year old woman who is found to have an abnormal mammogram of her left breast earlier this year showing a 13 mm group of calcifications in the upper quadrant of the left breast. 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 She underwent a left lumpectomy on 11/12/2021. The patient states the wound never really healed and was open. She underwent radiation therapy from 12/10/2021 through 01/19/22 28 fractions of 1.8GY. Essentially the wound would not heal. She was treated several times with antibiotics finally on 03/25/2022 she was taken to the OR by Dr. Barry Dienes for excision of the wound in a sinus. Operative cultures grew strep and Prevotella and a culture from the clinic Livedo MRSA. It was recommended for 4 weeks of Augmentin and doxycycline  by infectious disease. She was seen by wound care and she has been treating the wound with Medihoney ever since. 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 She was on meth methotrexate 20 mg once a week however this I think was put on hold because of the wound followed by Dr. Posey Pronto of rheumatology. She has asthma. Most recent breast ultrasound was on 9/7 She has a large nonhealing wound on the lateral aspect of her left breast 04/30/2022: Although I do not have a photograph to compare to last week, the RN report is that it is substantially cleaner. She still has ample slough and nonviable fat and subcutaneous tissue present. She only was able to get her Santyl on Monday so she has just had a  couple of days of treatment. 06/04/2022: The wound dimensions are about the same, but the surface is substantially cleaner. There is just a little bit of slough accumulation on the surface. Patient History Information obtained from Patient. Social History Never smoker, Marital Status - Married, Alcohol Use - Moderate, Drug Use - No History, Caffeine Use - Daily. Medical History Hematologic/Lymphatic Patient has history of Lymphedema - Left breast-gets Lymphadema tx Hospitalization/Surgery History - 03/25/22 Left Breast cyst excision; 11/12/21 Left Breast Lumpectomy with Radioactive seeds;Left breat Biopsy;Anterior. 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 12/08/21- 01/29/22- 37 rounds of Radiation Psychiatric General Anxiety Objective Constitutional No acute distress.. Vitals Time  Taken: 1:45 PM, Height: 63 in, Weight: 170 lbs, BMI: 30.1, Temperature: 97.9 F, Pulse: 87 bpm, Respiratory Rate: 16 breaths/min, Blood Pressure: 108/72 mmHg. Respiratory Normal work of breathing on room air.. General Notes: 06/04/2022: The wound dimensions are about the same, but the surface is substantially cleaner. There is just a little bit of slough accumulation on the surface. Integumentary (Hair, Skin) Wound #1 status is Open. Original cause of wound was Surgical Injury. The date acquired was: 04/03/2022. The wound has been in treatment 2 weeks. The wound is located on the Left Breast. The wound measures 3.2cm length x 5.1cm width x 0.3cm depth; 12.818cm^2 area and 3.845cm^3 volume. There is Fat Layer (Subcutaneous Tissue) exposed. There is no tunneling or undermining noted. There is a medium amount of serosanguineous drainage noted. There is medium (34-66%) red granulation within the wound bed. There is a medium (34-66%) amount of necrotic tissue within the wound bed including Adherent Slough. The periwound skin appearance had no abnormalities noted for texture. The periwound skin appearance had no abnormalities noted for moisture. The periwound skin appearance had no abnormalities noted for color. Periwound temperature was noted as No Abnormality. Assessment VARINA, HULON ([id number]) [id number] Page 6 of 8 Active Problems ICD-10 Disruption of external operation (surgical) wound, not elsewhere classified, subsequent encounter Other specified disorders of the skin and subcutaneous tissue related to radiation Unspecified open wound of left breast, subsequent encounter Intraductal carcinoma in situ of left breast Procedures Wound #1 Pre-procedure diagnosis of Wound #1 is a Malignant Wound located on the Left Breast . There was a Excisional Skin/Subcutaneous Tissue Debridement with a total area of 16.32 sq cm performed by [name], MD. With the  following instrument(s): Curette to remove Non-Viable tissue/material. Material removed includes Subcutaneous Tissue and Slough and after achieving pain control using Lidocaine 4% T opical Solution. No specimens were taken. A time out was conducted at 13:45, prior to the start of the procedure. A Minimum amount of bleeding was controlled with Pressure. The procedure was tolerated well with a pain level of 0 throughout and a pain level of 0 following the procedure. Post Debridement Measurements: 3.2cm length x 5.1cm width x 0.3cm depth; 3.845cm^3 volume. Character of Wound/Ulcer Post Debridement is improved. Post procedure Diagnosis Wound #1: Same as Pre-Procedure General Notes: Scribed for Dr. Celine Ahr by J.Scotton. Plan Follow-up Appointments: Return Appointment in 1 week. - Dr. Celine Ahr Room 3 Anesthetic: (In clinic) Topical Lidocaine 4% applied to wound bed - used in Clinic Bathing/ Shower/ Hygiene: Other Bathing/Shower/Hygiene Orders/Instructions: - Change dressing after bathing Edema Control - Lymphedema / SCD / Other: Other Edema Control Orders/Instructions: - Keep doing the Lymphadema checks WOUND #1: - Breast Wound Laterality: Left Cleanser: Normal Saline (Generic) 1 x  Per Day/30 Days Discharge Instructions: Cleanse the wound with Normal Saline prior to applying a clean dressing using gauze sponges, not tissue or cotton balls. Cleanser: Soap and Water 1 x Per Day/30 Days Discharge Instructions: May shower and wash wound with dial antibacterial soap and water prior to dressing change. Cleanser: Byram Ancillary Kit - 15 Day Supply (Generic) 1 x Per Day/30 Days Discharge Instructions: Use supplies as instructed; Kit contains: (15) Saline Bullets; (15) 3x3 Gauze; 15 pr Gloves Prim Dressing: Santyl Ointment (Generic) 1 x Per Day/30 Days ary Discharge Instructions: Apply nickel thick amount to wound bed as instructed Secondary Dressing: ABD Pad, 5x9 (Generic) 1 x Per Day/30 Days Discharge  Instructions: Apply over primary dressing as directed. Secondary Dressing: Woven Gauze Sponge, Non-Sterile 4x4 in (Generic) 1 x Per Day/30 Days Discharge Instructions: Apply saline moistened gauze over the Santyl in the wound bed Secured With: 53M Medipore Soft Cloth Surgical T 2x10 (in/yd) (Generic) 1 x Per Day/30 Days ape Discharge Instructions: Secure with tape as directed. 06/04/2022: The wound dimensions are about the same, but the surface is substantially cleaner. There is just a little bit of slough accumulation on the surface. I used a curette to debride slough off of the wound surface and remove additional nonviable subcutaneous tissue. The underlying surface remains fairly pale. We will continue to use Santyl while we continue to clean up the wound. She may benefit from a snap VAC prior to proceeding with a skin substitute. Follow-up in 1 week. Electronic Signature(s) Signed: 05/08/2022 2:05:09 PM By: [name] MD FACS Entered By: [name] on 05/08/2022 14:05:09 -------------------------------------------------------------------------------- HxROS Details Patient Name: Date of Service: [name], [name] 05/08/2022 1:15 PM Medical Record Number: [id number] Patient Account Number: [id number] [name], [name] ([id number]) [id number] Page 7 of 8 Date of Birth/Sex: Treating RN: 1970/10/04 (51 y.o. [name] Primary Care Provider: [name] Other Clinician: Referring Provider: Treating Provider/Extender: Shea Stakes in Treatment: 2 Information Obtained From Patient Ear/Nose/Mouth/Throat Medical History: Past Medical History Notes: Left ear limited hearing Hematologic/Lymphatic Medical History: Positive for: Lymphedema - Left breast-gets Lymphadema tx Gastrointestinal Medical History: Past Medical History Notes: GERD Immunological Medical History: Past Medical History Notes: Wegener"s  Granulomatosis Musculoskeletal Medical History: Past Medical History Notes: "RA like arthritis" as per patient Oncologic Medical History: Past Medical History Notes: Left Breast. 12/08/21- 01/29/22- 37 rounds of Radiation Psychiatric Medical History: Past Medical History Notes: General Anxiety Immunizations Pneumococcal Vaccine: Received Pneumococcal Vaccination: Yes Received Pneumococcal Vaccination On or After 60th Birthday: No Implantable Devices Yes Hospitalization / Surgery History Type of Hospitalization/Surgery 03/25/22 Left Breast cyst excision; 11/12/21 Left Breast Lumpectomy with Radioactive seeds;Left breat Biopsy;Anterior Family and Social History Never smoker; Marital Status - Married; Alcohol Use: Moderate; Drug Use: No History; Caffeine Use: Daily; Financial Concerns: No; Food, Clothing or Shelter Needs: No; Support System Lacking: No; Transportation Concerns: No Engineer, maintenance) Signed: 05/08/2022 2:25:37 PM By: [name] MD FACS Signed: 05/08/2022 6:01:31 PM By: Dellie Catholic RN Entered By: [name] on 05/08/2022 14:01:05 [name] ([id number]) [id number] Page 8 of 8 -------------------------------------------------------------------------------- SuperBill Details Patient Name: Date of Service: [name] Mile 05/08/2022 Medical Record Number: [id number] Patient Account Number: [id number] Date of Birth/Sex: Treating RN: [birth year] (51 y.o. [name] Primary Care Provider: [name] Other Clinician: Referring Provider: Treating Provider/Extender: [name], [name] Weeks in Treatment: 2 Diagnosis Coding ICD-10 Codes Code Description T81.31XD Disruption of external operation (surgical) wound, not elsewhere classified, subsequent encounter  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 S21.002D Unspecified open wound of left breast,  subsequent encounter 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 Facility Procedures : CPT4 Code: [id number] Description: 34037 - DEB SUBQ TISSUE 20 SQ CM/< ICD-10 Diagnosis Description S21.002D Unspecified open wound of left breast, subsequent encounter L59.8 Other specified disorders of the skin and subcutaneous tissue related to radiatio T81.31XD Disruption  of external operation (surgical) wound, not elsewhere classified, subs Modifier: n equent encounter Quantity: 1 Physician Procedures : CPT4 Code Description Modifier [id number] - WC PHYS LEVEL 4 - EST PT 25 ICD-10 Diagnosis Description 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 S21.002D Unspecified open wound of left breast, subsequent  encounter T81.31XD Disruption of external operation (surgical) wound, not elsewhere classified, subsequent encounter 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 Quantity: 1 : [id number] 11042 - WC PHYS SUBQ TISS 20 SQ CM ICD-10 Diagnosis Description S21.002D Unspecified open wound of left breast, subsequent encounter 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 T81.31XD Disruption of  external operation (surgical) wound, not elsewhere classified, subsequent encounter Quantity: 1 Electronic Signature(s) Signed: 05/08/2022 2:05:33 PM By: [name] MD FACS Entered By: [name] on 05/08/2022 14:05:33

## 2022-05-09 NOTE — Progress Notes (Signed)
[name], [name] ([id number]) [id number] Page 1 of 6 Visit Report for 05/08/2022 Arrival Information Details Patient Name: Date of Service: [name] 05/08/2022 1:15 PM Medical Record Number: [id number] Patient Account Number: [id number] Date of Birth/Sex: Treating RN: 1971/04/12 (51 y.o. [name] Primary Care Phynix Horton: [name] Other Clinician: Referring Raniya Golembeski: Treating Mara Favero/Extender: Shea Stakes in Treatment: 2 Visit Information History Since Last Visit Added or deleted any medications: No Patient Arrived: Ambulatory Any new allergies or adverse reactions: No Arrival Time: 13:49 Had a fall or experienced change in No Accompanied By: self activities of daily living that may affect Transfer Assistance: None risk of falls: Patient Identification Verified: Yes Hospitalized since last visit: No Implantable device outside of the clinic excluding No cellular tissue based products placed in the center since last visit: Has Dressing in Place as Prescribed: Yes Pain Present Now: No Electronic Signature(s) Signed: 05/08/2022 6:01:31 PM By: Dellie Catholic RN Entered By: Dellie Catholic on 05/08/2022 13:49:48 -------------------------------------------------------------------------------- Encounter Discharge Information Details Patient Name: Date of Service: [name], [name] 05/08/2022 1:15 PM Medical Record Number: [id number] Patient Account Number: [id number] Date of Birth/Sex: Treating RN: 02-Sep-1970 (51 y.o. [name] Primary Care Holy Battenfield: [name] Other Clinician: Referring Penne Rosenstock: Treating Virgilia Quigg/Extender: Shea Stakes in Treatment: 2 Encounter Discharge Information Items Post Procedure Vitals Discharge Condition: Stable Temperature (F): 97.9 Ambulatory Status: Ambulatory Pulse (bpm): 87 Discharge Destination: Home Respiratory Rate  (breaths/min): 16 Transportation: Private Auto Blood Pressure (mmHg): 108/72 Accompanied By: self Schedule Follow-up Appointment: Yes Clinical Summary of Care: Patient Declined Electronic Signature(s) Signed: 05/08/2022 6:01:31 PM By: Dellie Catholic RN Entered By: Dellie Catholic on 05/08/2022 17:59:41 [name] ([id number]) [id number] Page 2 of 6 -------------------------------------------------------------------------------- Lower Extremity Assessment Details Patient Name: Date of Service: [name] 05/08/2022 1:15 PM Medical Record Number: [id number] Patient Account Number: [id number] Date of Birth/Sex: Treating RN: 1970/10/22 (51 y.o. [name] Primary Care Maitland Muhlbauer: [name] Other Clinician: Referring Trindon Dorton: Treating Timoteo Carreiro/Extender: [name], [name] Weeks in Treatment: 2 Electronic Signature(s) Signed: 05/08/2022 6:01:31 PM By: Dellie Catholic RN Entered By: Dellie Catholic on 05/08/2022 13:51:25 -------------------------------------------------------------------------------- Multi Wound Chart Details Patient Name: Date of Service: [name], [name] 05/08/2022 1:15 PM Medical Record Number: [id number] Patient Account Number: [id number] Date of Birth/Sex: Treating RN: 07/11/1970 (51 y.o. [name] Primary Care Lamine Laton: [name] Other Clinician: Referring [name]: Treating [name]/Extender: [name], [name] Weeks in Treatment: 2 Vital Signs Height(in): 63 Pulse(bpm): 87 Weight(lbs): 170 Blood Pressure(mmHg): 108/72 Body Mass Index(BMI): 30.1 Temperature(F): 97.9 Respiratory Rate(breaths/min): 16 [1:Photos:] [N/A:N/A] Left Breast N/A N/A Wound Location: Surgical Injury N/A N/A Wounding Event: Malignant Wound N/A N/A Primary Etiology: Lymphedema N/A N/A Comorbid History: 04/03/2022 N/A N/A Date Acquired: 2 N/A N/A Weeks of Treatment: Open N/A  N/A Wound Status: No N/A N/A Wound Recurrence: 3.2x5.1x0.3 N/A N/A Measurements L x W x D (cm) 12.818 N/A N/A A (cm) : rea 3.845 N/A N/A Volume (cm) : 3.80% N/A N/A % Reduction in A rea: 27.80% N/A N/A % Reduction in Volume: Full Thickness Without Exposed N/A N/A Classification: Support Structures Medium N/A N/A Exudate Amount: Serosanguineous N/A N/A Exudate Type: red, brown N/A N/A Exudate Color: Medium (34-66%) N/A N/A Granulation Amount: Red N/A N/A Granulation Quality: Medium (34-66%) N/A N/A Necrotic Amount: Fat Layer (Subcutaneous Tissue): Yes N/A N/A Exposed Structures: EVIN, CHIRCO ([id number]) [id number] Page 3 of 6 Fascia: No Tendon: No Muscle:  No Joint: No Bone: No None N/A N/A Epithelialization: Debridement - Excisional N/A N/A Debridement: Pre-procedure Verification/Time Out 13:45 N/A N/A Taken: Lidocaine 4% Topical Solution N/A N/A Pain Control: Subcutaneous, Slough N/A N/A Tissue Debrided: Skin/Subcutaneous Tissue N/A N/A Level: 16.32 N/A N/A Debridement A (sq cm): rea Curette N/A N/A Instrument: Minimum N/A N/A Bleeding: Pressure N/A N/A Hemostasis A chieved: 0 N/A N/A Procedural Pain: 0 N/A N/A Post Procedural Pain: Procedure was tolerated well N/A N/A Debridement Treatment Response: 3.2x5.1x0.3 N/A N/A Post Debridement Measurements L x W x D (cm) 3.845 N/A N/A Post Debridement Volume: (cm) No Abnormalities Noted N/A N/A Periwound Skin Texture: No Abnormalities Noted N/A N/A Periwound Skin Moisture: No Abnormalities Noted N/A N/A Periwound Skin Color: No Abnormality N/A N/A Temperature: Debridement N/A N/A Procedures Performed: Treatment Notes Electronic Signature(s) Signed: 05/08/2022 1:58:26 PM By: [name] MD FACS Signed: 05/08/2022 6:01:31 PM By: Dellie Catholic RN Entered By: [name] on 05/08/2022  13:58:26 -------------------------------------------------------------------------------- Multi-Disciplinary Care Plan Details Patient Name: Date of Service: [name], [name] 05/08/2022 1:15 PM Medical Record Number: [id number] Patient Account Number: [id number] Date of Birth/Sex: Treating RN: [birth year] (51 y.o. [name] Primary Care Kadasia Kassing: [name] Other Clinician: Referring Jelina Paulsen: Treating Montia Haslip/Extender: [name], [name] Weeks in Treatment: 2 Active Inactive Wound/Skin Impairment Nursing Diagnoses: Impaired tissue integrity Goals: Patient/caregiver will verbalize understanding of skin care regimen Date Initiated: 04/24/2022 Target Resolution Date: 07/05/2022 Goal Status: Active Interventions: Assess ulceration(s) every visit Treatment Activities: Skin care regimen initiated : 04/24/2022 Notes: Electronic Signature(s) Signed: 05/08/2022 6:01:31 PM By: Dellie Catholic RN Entered By: Dellie Catholic on 05/08/2022 17:58:40 [name] ([id number]) [id number] Page 4 of 6 -------------------------------------------------------------------------------- Pain Assessment Details Patient Name: Date of Service: [name] 05/08/2022 1:15 PM Medical Record Number: [id number] Patient Account Number: [id number] Date of Birth/Sex: Treating RN: 07-07-1970 (50 y.o. [name] Primary Care Khairi Garman: [name] Other Clinician: Referring Javin Nong: Treating Rozina Pointer/Extender: [name], [name] Weeks in Treatment: 2 Active Problems Location of Pain Severity and Description of Pain Patient Has Paino No Site Locations Pain Management and Medication Current Pain Management: Electronic Signature(s) Signed: 05/08/2022 6:01:31 PM By: Dellie Catholic RN Entered By: Dellie Catholic on 05/08/2022  13:51:19 -------------------------------------------------------------------------------- Patient/Caregiver Education Details Patient Name: Date of Service: [name] 11/3/2023andnbsp1:15 PM Medical Record Number: [id number] Patient Account Number: [id number] Date of Birth/Gender: Treating RN: April 04, 1971 (51 y.o. [name] Primary Care Physician: [name] Other Clinician: Referring Physician: Treating Physician/Extender: Shea Stakes in Treatment: 2 Education Assessment Education Provided To: Patient Education Topics Provided Wound/Skin Impairment: Methods: Explain/Verbal CHLOEE, TENA ([id number]) [id number] Page 5 of 6 Responses: Return demonstration correctly Electronic Signature(s) Signed: 05/08/2022 6:01:31 PM By: Dellie Catholic RN Entered By: Dellie Catholic on 05/08/2022 17:58:52 -------------------------------------------------------------------------------- Wound Assessment Details Patient Name: Date of Service: [name] 05/08/2022 1:15 PM Medical Record Number: [id number] Patient Account Number: [id number] Date of Birth/Sex: Treating RN: 1971/01/09 (51 y.o. 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 Wound Status: Open Wounding Event: Surgical Injury Comorbid History: Lymphedema Date Acquired: 04/03/2022 Weeks Of Treatment: 2 Clustered Wound: No Photos Wound Measurements Length: (cm) 3.2 Width: (cm) 5.1 Depth: (cm) 0.3 Area: (cm) 12.818 Volume: (cm) 3.845 % Reduction in Area: 3.8% % Reduction in Volume: 27.8% Epithelialization: None  Tunneling: No Undermining: No Wound Description Classification: Full Thickness Without Exposed Su Exudate  Amount: Medium Exudate Type: Serosanguineous Exudate Color: red, brown pport Structures Wound Bed Granulation Amount: Medium (34-66%) Exposed Structure Granulation Quality: Red Fascia Exposed: No Necrotic Amount: Medium (34-66%) Fat Layer (Subcutaneous Tissue) Exposed: Yes Necrotic Quality: Adherent Slough Tendon Exposed: No Muscle Exposed: No Joint Exposed: No Bone Exposed: No Periwound Skin Texture Texture Color No Abnormalities Noted: Yes No Abnormalities Noted: Yes Moisture Temperature / Pain No Abnormalities Noted: Yes Temperature: No Abnormality ELDONNA, NEUENFELDT ([id number]) [id number] Page 6 of 6 Treatment Notes Wound #1 (Breast) Wound Laterality: Left Cleanser Normal Saline Discharge Instruction: Cleanse the wound with Normal Saline prior to applying a clean dressing using gauze sponges, not tissue or cotton balls. Soap and Water Discharge Instruction: May shower and wash wound with dial antibacterial soap and water prior to dressing change. Byram Ancillary Kit - 15 Day Supply Discharge Instruction: Use supplies as instructed; Kit contains: (15) Saline Bullets; (15) 3x3 Gauze; 15 pr Gloves Peri-Wound Care Topical Primary Dressing Santyl Ointment Discharge Instruction: Apply nickel thick amount to wound bed as instructed Secondary Dressing ABD Pad, 5x9 Discharge Instruction: Apply over primary dressing as directed. Woven Gauze Sponge, Non-Sterile 4x4 in Discharge Instruction: Apply saline moistened gauze over the Santyl in the wound bed Secured With [facility] T 2x10 (in/yd) ape Discharge Instruction: Secure with tape as directed. Compression Wrap Compression Stockings Add-Ons Electronic Signature(s) Signed: 05/08/2022 6:01:31 PM By: Dellie Catholic RN Entered By: Dellie Catholic on 05/08/2022 13:44:58 -------------------------------------------------------------------------------- Vitals Details Patient  Name: Date of Service: [name], [place] W. 05/08/2022 1:15 PM Medical Record Number: [id number] Patient Account Number: [id number] Date of Birth/Sex: Treating RN: 1971-06-28 (51 y.o. [name] Primary Care Ferry Matthis: [name] Other Clinician: Referring Shadell Brenn: Treating Azalyn Sliwa/Extender: [name], [name] Weeks in Treatment: 2 Vital Signs Time Taken: 13:45 Temperature (F): 97.9 Height (in): 63 Pulse (bpm): 87 Weight (lbs): 170 Respiratory Rate (breaths/min): 16 Body Mass Index (BMI): 30.1 Blood Pressure (mmHg): 108/72 Reference Range: 80 - 120 mg / dl Electronic Signature(s) Signed: 05/08/2022 6:01:31 PM By: Dellie Catholic RN Entered By: Dellie Catholic on 05/08/2022 13:51:13

## 2022-05-12 ENCOUNTER — Other Ambulatory Visit (HOSPITAL_COMMUNITY): Payer: Self-pay

## 2022-05-18 ENCOUNTER — Other Ambulatory Visit (HOSPITAL_COMMUNITY): Payer: Self-pay

## 2022-05-18 ENCOUNTER — Encounter (HOSPITAL_BASED_OUTPATIENT_CLINIC_OR_DEPARTMENT_OTHER): Payer: 59 | Admitting: General Surgery

## 2022-05-18 DIAGNOSIS — T8131XD Disruption of external operation (surgical) wound, not elsewhere classified, subsequent encounter: Secondary | ICD-10-CM | POA: Diagnosis not present

## 2022-05-18 DIAGNOSIS — S21002A Unspecified open wound of left breast, initial encounter: Secondary | ICD-10-CM | POA: Diagnosis not present

## 2022-05-18 DIAGNOSIS — K219 Gastro-esophageal reflux disease without esophagitis: Secondary | ICD-10-CM | POA: Diagnosis not present

## 2022-05-18 DIAGNOSIS — J45909 Unspecified asthma, uncomplicated: Secondary | ICD-10-CM | POA: Diagnosis not present

## 2022-05-18 DIAGNOSIS — M313 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: Secondary | ICD-10-CM | POA: Diagnosis not present

## 2022-05-18 DIAGNOSIS — C50112 Malignant neoplasm of central portion of left female breast: Secondary | ICD-10-CM | POA: Diagnosis not present

## 2022-05-19 ENCOUNTER — Other Ambulatory Visit (HOSPITAL_COMMUNITY): Payer: Self-pay

## 2022-05-19 ENCOUNTER — Other Ambulatory Visit: Payer: Self-pay

## 2022-05-19 DIAGNOSIS — E7800 Pure hypercholesterolemia, unspecified: Secondary | ICD-10-CM | POA: Diagnosis not present

## 2022-05-19 DIAGNOSIS — M313 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: Secondary | ICD-10-CM | POA: Diagnosis not present

## 2022-05-19 DIAGNOSIS — N61 Mastitis without abscess: Secondary | ICD-10-CM | POA: Diagnosis not present

## 2022-05-20 ENCOUNTER — Other Ambulatory Visit (HOSPITAL_COMMUNITY): Payer: Self-pay

## 2022-05-20 NOTE — Progress Notes (Signed)
[name], [name] ([id number]) [id number] Page 1 of 6 Visit Report for 05/18/2022 Arrival Information Details Patient Name: Date of Service: [name] 05/18/2022 9:30 A M Medical Record Number: [id number] Patient Account Number: [id number] Date of Birth/Sex: Treating RN: Jul 02, 1971 (51 y.o. [name] : [name] Other Clinician: Referring : Treating /Extender: Shea Stakes in Treatment: 3 Visit Information History Since Last Visit Added or deleted any medications: No Patient Arrived: Ambulatory Any new allergies or adverse reactions: No Arrival Time: 09:32 Had a fall or experienced change in No Accompanied By: self activities of daily living that may affect Transfer Assistance: None risk of falls: Patient Identification Verified: Yes Signs or symptoms of abuse/neglect since last visito No Secondary Verification Process Completed: Yes Hospitalized since last visit: No Implantable device outside of the clinic excluding No cellular tissue based products placed in the center since last visit: Has Dressing in Place as Prescribed: Yes Pain Present Now: No Electronic Signature(s) Signed: 05/19/2022 7:40:26 AM By: [name] Entered By: [name] on 05/18/2022 09:32:59 -------------------------------------------------------------------------------- Encounter Discharge Information Details Patient Name: Date of Service: [name], [place] W. 05/18/2022 9:30 A M Medical Record Number: [id number] Patient Account Number: [id number] Date of Birth/Sex: Treating RN: Jul 09, 1970 (51 y.o. Donalda Ewings Primary Care : [name] Other Clinician: Referring : Treating /Extender: Shea Stakes in Treatment: 3 Encounter Discharge Information Items Post Procedure Vitals Discharge Condition: Stable Temperature  (F): 98.4 Ambulatory Status: Ambulatory Pulse (bpm): 92 Discharge Destination: Home Respiratory Rate (breaths/min): 18 Transportation: Private Auto Blood Pressure (mmHg): 115/77 Accompanied By: self Schedule Follow-up Appointment: Yes Clinical Summary of Care: Patient Declined Electronic Signature(s) Signed: 05/20/2022 8:21:58 AM By: Sharyn Creamer RN, BSN Entered By: Sharyn Creamer on 05/18/2022 10:00:23 [name] ([id number]) [id number] Page 2 of 6 -------------------------------------------------------------------------------- Lower Extremity Assessment Details Patient Name: Date of Service: [name] 05/18/2022 9:30 A M Medical Record Number: [id number] Patient Account Number: [id number] Date of Birth/Sex: Treating RN: 10-19-1970 (51 y.o. [name] : [name] Other Clinician: Referring : Treating /Extender: [name], [name] Weeks in Treatment: 3 Electronic Signature(s) Signed: 05/19/2022 7:40:26 AM By: Sabas Sous By: [name] on 05/18/2022 09:33:10 -------------------------------------------------------------------------------- Multi Wound Chart Details Patient Name: Date of Service: [name], [place] W. 05/18/2022 9:30 A M Medical Record Number: [id number] Patient Account Number: [id number] Date of Birth/Sex: Treating RN: Sep 20, 1970 (51 y.o. F) Primary Care : [name] Other Clinician: Referring : Treating /Extender: [name], [name] Weeks in Treatment: 3 Vital Signs Height(in): 63 Pulse(bpm): 92 Weight(lbs): 170 Blood Pressure(mmHg): 115/77 Body Mass Index(BMI): 30.1 Temperature(F): 98.4 Respiratory Rate(breaths/min): 18 [1:Photos:] [N/A:N/A] Left Breast N/A N/A Wound Location: Surgical Injury N/A N/A Wounding Event: Malignant Wound N/A N/A Primary Etiology: Lymphedema N/A  N/A Comorbid History: 04/03/2022 N/A N/A Date Acquired: 3 N/A N/A Weeks of Treatment: Open N/A N/A Wound Status: No N/A N/A Wound Recurrence: 2.9x5.1x0.3 N/A N/A Measurements L x W x D (cm) 11.616 N/A N/A A (cm) : rea 3.485 N/A N/A Volume (cm) : 12.80% N/A N/A % Reduction in A rea: 34.60% N/A N/A % Reduction in Volume: Full Thickness Without Exposed N/A N/A Classification: Support Structures Medium N/A N/A Exudate Amount: Serosanguineous N/A N/A Exudate Type: red, brown N/A N/A Exudate Color: Distinct, outline attached N/A N/A Wound Margin: Small (1-33%) N/A N/A Granulation Amount: Red N/A N/A Granulation Quality: Large (67-100%) N/A N/A Necrotic Amount: Maines,  [name] ([id number]) [id number] Page 3 of 6 Fat Layer (Subcutaneous Tissue): Yes N/A N/A Exposed Structures: Fascia: No Tendon: No Muscle: No Joint: No Bone: No None N/A N/A Epithelialization: Debridement - Selective/Open Wound N/A N/A Debridement: Pre-procedure Verification/Time Out 09:45 N/A N/A Taken: Lidocaine 4% Topical Solution N/A N/A Pain Control: Slough N/A N/A Tissue Debrided: Non-Viable Tissue N/A N/A Level: 14.79 N/A N/A Debridement A (sq cm): rea Curette N/A N/A Instrument: Minimum N/A N/A Bleeding: Pressure N/A N/A Hemostasis A chieved: 0 N/A N/A Procedural Pain: 0 N/A N/A Post Procedural Pain: Procedure was tolerated well N/A N/A Debridement Treatment Response: 2.9x5.1x0.3 N/A N/A Post Debridement Measurements L x W x D (cm) 3.485 N/A N/A Post Debridement Volume: (cm) No Abnormalities Noted N/A N/A Periwound Skin Texture: No Abnormalities Noted N/A N/A Periwound Skin Moisture: No Abnormalities Noted N/A N/A Periwound Skin Color: No Abnormality N/A N/A Temperature: Debridement N/A N/A Procedures Performed: Treatment Notes Electronic Signature(s) Signed: 05/18/2022 9:50:46 AM By: [name] MD FACS Entered By: [name] on 05/18/2022 09:50:45 -------------------------------------------------------------------------------- Multi-Disciplinary Care Plan Details Patient Name: Date of Service: [name], [place] W. 05/18/2022 9:30 A M Medical Record Number: [id number] Patient Account Number: [id number] Date of Birth/Sex: Treating RN: 24-Jan-1971 (51 y.o. [name] : [name] Other Clinician: Referring : Treating /Extender: [name], [name] Weeks in Treatment: 3 Active Inactive Wound/Skin Impairment Nursing Diagnoses: Impaired tissue integrity Goals: Patient/caregiver will verbalize understanding of skin care regimen Date Initiated: 04/24/2022 Target Resolution Date: 07/05/2022 Goal Status: Active Interventions: Assess ulceration(s) every visit Treatment Activities: Skin care regimen initiated : 04/24/2022 Notes: Electronic Signature(s) Signed: 05/19/2022 7:40:26 AM By: [name] Entered By: [name] on 05/18/2022 09:41:06 [name] ([id number]) [id number] Page 4 of 6 -------------------------------------------------------------------------------- Pain Assessment Details Patient Name: Date of Service: [name] 05/18/2022 9:30 A M Medical Record Number: [id number] Patient Account Number: [id number] Date of Birth/Sex: Treating RN: 02/07/1971 (51 y.o. [name] : [name] Other Clinician: Referring : Treating /Extender: [name], [name] Weeks in Treatment: 3 Active Problems Location of Pain Severity and Description of Pain Patient Has Paino No Site Locations Rate the pain. Current Pain Level: 0 Pain Management and Medication Current Pain Management: Electronic Signature(s) Signed: 05/19/2022 7:40:26 AM By: [name] Entered By: [name] on 05/18/2022  09:33:06 -------------------------------------------------------------------------------- Patient/Caregiver Education Details Patient Name: Date of Service: [name] 11/13/2023andnbsp9:30 A M Medical Record Number: [id number] Patient Account Number: [id number] Date of Birth/Gender: Treating RN: 02/18/1971 (51 y.o. [name] Physician: [name] Other Clinician: Referring Physician: Treating Physician/Extender: Shea Stakes in Treatment: 3 Education Assessment Education Provided To: Patient Education Topics Provided Wound/Skin Impairment: Methods: Explain/Verbal ADRAINE, BIFFLE ([id number]) [id number] Page 5 of 6 Responses: State content correctly Electronic Signature(s) Signed: 05/19/2022 7:40:26 AM By: Sabas Sous By: [name] on 05/18/2022 09:41:32 -------------------------------------------------------------------------------- Wound Assessment Details Patient Name: Date of Service: [name]. 05/18/2022 9:30 A M Medical Record Number: [id number] Patient Account Number: [id number] Date of Birth/Sex: Treating RN: 09-28-1970 (51 y.o. 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 Wound Status: Open Wounding Event: Surgical Injury Comorbid History: Lymphedema Date Acquired: 04/03/2022 Weeks Of Treatment: 3 Clustered Wound: No Photos Wound Measurements Length: (cm) 2.9 Width: (cm)  5.1 Depth: (cm) 0.3 Area: (cm) 11.616 Volume: (cm) 3.485 % Reduction in Area: 12.8% % Reduction in Volume: 34.6% Epithelialization: None Tunneling: No Undermining: No Wound Description Classification: Full Thickness Without Exposed  Suppor Wound Margin: Distinct, outline attached Exudate Amount: Medium Exudate Type: Serosanguineous Exudate Color: red, brown t Structures Foul Odor After Cleansing: No Slough/Fibrino Yes Wound Bed Granulation Amount: Small (1-33%) Exposed Structure Granulation Quality: Red Fascia Exposed: No Necrotic Amount: Large (67-100%) Fat Layer (Subcutaneous Tissue) Exposed: Yes Necrotic Quality: Adherent Slough Tendon Exposed: No Muscle Exposed: No Joint Exposed: No Bone Exposed: No Periwound Skin Texture Texture Color No Abnormalities Noted: Yes No Abnormalities Noted: Yes Moisture Temperature / Pain TYRICA, AFZAL ([id number]) [id number] Page 6 of 6 No Abnormalities Noted: Yes Temperature: No Abnormality Treatment Notes Wound #1 (Breast) Wound Laterality: Left Cleanser Normal Saline Discharge Instruction: Cleanse the wound with Normal Saline prior to applying a clean dressing using gauze sponges, not tissue or cotton balls. Soap and Water Discharge Instruction: May shower and wash wound with dial antibacterial soap and water prior to dressing change. Byram Ancillary Kit - 15 Day Supply Discharge Instruction: Use supplies as instructed; Kit contains: (15) Saline Bullets; (15) 3x3 Gauze; 15 pr Gloves Peri-Wound Care Topical Primary Dressing Santyl Ointment Discharge Instruction: Apply nickel thick amount to wound bed as instructed Secondary Dressing ABD Pad, 5x9 Discharge Instruction: Apply over primary dressing as directed. Woven Gauze Sponge, Non-Sterile 4x4 in Discharge Instruction: Apply saline moistened gauze over the Santyl in the wound bed Secured With [place] [facility] T 2x10 (in/yd) ape Discharge Instruction: Secure with tape as directed. Compression Wrap Compression Stockings Add-Ons Electronic Signature(s) Signed: 05/19/2022 7:40:26 AM By: [name] Entered By: [name] on 05/18/2022  09:39:57 -------------------------------------------------------------------------------- Vitals Details Patient Name: Date of Service: [name], [place] W. 05/18/2022 9:30 A M Medical Record Number: [id number] Patient Account Number: [id number] Date of Birth/Sex: Treating RN: 1970-09-01 (51 y.o. [name] Taysom Glymph: [name] Other Clinician: Referring Lauriana Denes: Treating Julanne Schlueter/Extender: [name], [name] Weeks in Treatment: 3 Vital Signs Time Taken: 09:33 Temperature (F): 98.4 Height (in): 63 Pulse (bpm): 92 Weight (lbs): 170 Respiratory Rate (breaths/min): 18 Body Mass Index (BMI): 30.1 Blood Pressure (mmHg): 115/77 Reference Range: 80 - 120 mg / dl Electronic Signature(s) Signed: 05/19/2022 7:40:26 AM By: [name] Entered By: [name] on 05/18/2022 09:34:48

## 2022-05-20 NOTE — Progress Notes (Signed)
[name] ([id number]) [id number] Page 1 of 8 Visit Report for 05/18/2022 Chief Complaint Document Details Patient Name: Date of Service: [name] 05/18/2022 9:30 A M Medical Record Number: [id number] Patient Account Number: [id number] Date of Birth/Sex: Treating RN: 01/01/1971 (51 y.o. F) Primary Care Provider: [name] Other Clinician: Referring Provider: Treating Provider/Extender: [name], [name] Weeks in Treatment: 3 Information Obtained from: Patient Chief Complaint 04/24/2022; patient is here for a surgical wound on her left lateral breast Electronic Signature(s) Signed: 05/18/2022 9:50:54 AM By: [name] MD FACS Entered By: [name] on 05/18/2022 09:50:54 -------------------------------------------------------------------------------- Debridement Details Patient Name: Date of Service: Unk [name] NIE W. 05/18/2022 9:30 A M Medical Record Number: [id number] Patient Account Number: [id number] Date of Birth/Sex: Treating RN: 15-Dec-1970 (51 y.o. [name] Primary Care Provider: [name] Other Clinician: Referring Provider: Treating Provider/Extender: Shea Stakes in Treatment: 3 Debridement Performed for Assessment: Wound #1 Left Breast Performed By: Physician [name], MD Debridement Type: Debridement Level of Consciousness (Pre-procedure): Awake and Alert Pre-procedure Verification/Time Out Yes - 09:45 Taken: Start Time: 09:45 Pain Control: Lidocaine 4% T opical Solution T Area Debrided (L x W): otal 2.9 (cm) x 5.1 (cm) = 14.79 (cm) Tissue and other material debrided: Non-Viable, Slough, Slough Level: Non-Viable Tissue Debridement Description: Selective/Open Wound Instrument: Curette Bleeding: Minimum Hemostasis Achieved: Pressure Procedural Pain: 0 Post Procedural Pain: 0 Response to Treatment: Procedure was tolerated well Level  of Consciousness (Post- Awake and Alert procedure): Post Debridement Measurements of Total Wound Length: (cm) 2.9 Width: (cm) 5.1 Depth: (cm) 0.3 Volume: (cm) 3.485 Character of Wound/Ulcer Post Debridement: Improved Post Procedure Diagnosis Same as [name], [name] ([id number]) [id number] Page 2 of 8 Notes Scribed for Dr Celine Ahr by Sharyn Creamer, RN Electronic Signature(s) Signed: 05/18/2022 10:59:09 AM By: [name] MD FACS Signed: 05/20/2022 8:21:58 AM By: Sharyn Creamer RN, BSN Entered By: Sharyn Creamer on 05/18/2022 09:49:29 -------------------------------------------------------------------------------- HPI Details Patient Name: Date of Service: [name] RD, [name] 05/18/2022 9:30 A M Medical Record Number: [id number] Patient Account Number: [id number] Date of Birth/Sex: Treating RN: [birth year] (51 y.o. F) Primary Care Provider: [name] Other Clinician: Referring Provider: Treating Provider/Extender: [name], [name] Weeks in Treatment: 3 History of Present Illness HPI Description: ADMISSION 05/25/2022 This is a 51 year old woman who is found to have an abnormal mammogram of her left breast earlier this year showing a 13 mm group of calcifications in the upper quadrant of the left breast. 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 She underwent a left lumpectomy on 11/12/2021. The patient states the wound never really healed and was open. She underwent radiation therapy from 12/10/2021 through 01/19/22 28 fractions of 1.8GY. Essentially the wound would not heal. She was treated several times with antibiotics finally on 03/25/2022 she was taken to the OR by Dr. Barry Dienes for excision of the wound in a sinus. Operative cultures grew strep and Prevotella and a culture from the clinic Livedo MRSA. It was recommended for 4 weeks of  Augmentin and doxycycline by infectious disease. She was seen by wound care and she has been treating the wound with Medihoney ever since. 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 She was on meth methotrexate 20 mg once a week however this I think was put on hold because of the wound followed by Dr. Posey Pronto of rheumatology. She has asthma. Most recent breast ultrasound was on 9/7 She has a large nonhealing wound on the lateral aspect of her left breast 04/30/2022: Although I do not have a photograph to compare to last week, the RN report is that it is substantially cleaner. She still has ample slough and nonviable fat and subcutaneous tissue present. She only was able to get her Santyl on Monday so she has just had a couple of days of treatment. 05/08/2022: The wound dimensions are about the same, but the surface is substantially cleaner. There is just a little bit of slough accumulation on the surface. 05/18/2022: The wound is a little bit smaller, but not much. It is quite a bit cleaner, however. Electronic Signature(s) Signed: 05/18/2022 9:51:37 AM By: [name] MD FACS Entered By: [name] on 05/18/2022 09:51:37 -------------------------------------------------------------------------------- Physical Exam Details Patient Name: Date of Service: [name] 05/18/2022 9:30 A M Medical Record Number: [id number] Patient Account Number: [id number] Date of Birth/Sex: Treating RN: 1971-05-07 (51 y.o. F) Primary Care Provider: [name] Other Clinician: Referring Provider: Treating Provider/Extender: [name], [name] Weeks in Treatment: 3 Constitutional . . . . No acute distress.Marland Kitchen [name] ([id number])  [id number] Page 3 of 8 Respiratory Normal work of breathing on room air.. Notes 05/18/2022: The wound is a little bit smaller, but not much. It is quite a bit cleaner, however. Electronic Signature(s) Signed: 05/18/2022 9:52:06 AM By: [name] MD FACS Entered By: [name] on 05/18/2022 09:52:06 -------------------------------------------------------------------------------- Physician Orders Details Patient Name: Date of Service: [name] RD, [name] 05/18/2022 9:30 A M Medical Record Number: [id number] Patient Account Number: [id number] Date of Birth/Sex: Treating RN: February 21, 1971 (51 y.o. [name] Primary Care Provider: [name] Other Clinician: Referring Provider: Treating Provider/Extender: Shea Stakes in Treatment: 3 Verbal / Phone Orders: No Diagnosis Coding ICD-10 Coding Code Description T81.31XD Disruption of external operation (surgical) wound, not elsewhere classified, subsequent encounter 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 S21.002D Unspecified open wound of left breast, subsequent encounter 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 Follow-up Appointments ppointment in 1 week. - Dr. Celine Ahr Room 3 Return A Anesthetic (In clinic) Topical Lidocaine 4% applied to wound bed - used in Clinic Bathing/ Shower/ Hygiene Other Bathing/Shower/Hygiene Orders/Instructions: - Change dressing after bathing Edema Control - Lymphedema / SCD / Other Other Edema Control Orders/Instructions: - Keep doing the Lymphadema checks Wound Treatment Wound #1 - Breast Wound Laterality: Left Cleanser: Normal Saline (DME) (Generic) 1 x Per Day/30 Days Discharge Instructions: Cleanse the wound with Normal Saline prior to applying a clean dressing using gauze sponges, not tissue or cotton balls. Cleanser: Soap and Water 1 x Per Day/30 Days Discharge  Instructions: May shower and wash wound with dial antibacterial soap and water prior to dressing change. Cleanser: Byram Ancillary Kit - 15 Day Supply (Generic) 1 x Per Day/30 Days Discharge Instructions: Use supplies as instructed; Kit contains: (15) Saline Bullets; (15) 3x3 Gauze; 15 pr Gloves Prim Dressing: Santyl Ointment (Generic) 1 x Per Day/30 Days ary Discharge Instructions: Apply nickel thick amount to wound bed as instructed Secondary Dressing: ABD Pad, 5x9 (DME) (Generic) 1 x Per Day/30 Days Discharge Instructions: Apply over primary dressing as directed. Secondary Dressing: Woven Gauze Sponge, Non-Sterile 4x4  in (DME) (Generic) 1 x Per Day/30 Days Discharge Instructions: Apply saline moistened gauze over the Santyl in the wound bed Secured With: 73M Medipore Soft Cloth Surgical T 2x10 (in/yd) (DME) (Generic) 1 x Per Day/30 Days ape Discharge Instructions: Secure with tape as directed. KERON, KOFFMAN ([id number]) [id number] Page 4 of 8 Patient Medications llergies: kiwi, Ativan, erythromycin base, Xanax, nitrofurantoin A Notifications Medication Indication Start End prior to debridement 05/18/2022 lidocaine DOSE topical 4 % cream - cream topical Electronic Signature(s) Signed: 05/18/2022 10:59:09 AM By: [name] MD FACS Signed: 05/20/2022 8:21:58 AM By: Sharyn Creamer RN, BSN Entered By: Sharyn Creamer on 05/18/2022 09:54:03 -------------------------------------------------------------------------------- Problem List Details Patient Name: Date of Service: [name] RD, [place] W. 05/18/2022 9:30 A M Medical Record Number: [id number] Patient Account Number: [id number] Date of Birth/Sex: Treating RN: 1971/04/18 (51 y.o. F) Primary Care Provider: [name] Other Clinician: Referring Provider: Treating Provider/Extender: [name], [name] Weeks in Treatment: 3 Active Problems ICD-10 Encounter Code Description Active  Date MDM Diagnosis T81.31XD Disruption of external operation (surgical) wound, not elsewhere classified, 04/24/2022 No Yes subsequent encounter L59.8 Other specified disorders of the skin and subcutaneous tissue related to 04/24/2022 No Yes radiation S21.002D Unspecified open wound of left breast, subsequent encounter 04/24/2022 No Yes 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 04/24/2022 No Yes Inactive Problems Resolved Problems Electronic Signature(s) Signed: 05/18/2022 9:50:32 AM By: [name] MD FACS Entered By: [name] on 05/18/2022 09:50:32 -------------------------------------------------------------------------------- Progress Note Details Patient Name: Date of Service: [name] RD, [name] NIE W. 05/18/2022 9:30 A Diona Browner ([id number]) [id number] Page 5 of 8 Medical Record Number: [id number] Patient Account Number: [id number] Date of Birth/Sex: Treating RN: Mar 22, 1971 (51 y.o. F) Primary Care Provider: [name] Other Clinician: Referring Provider: Treating Provider/Extender: [name], [name] Weeks in Treatment: 3 Subjective Chief Complaint Information obtained from Patient 04/24/2022; patient is here for a surgical wound on her left lateral breast History of Present Illness (HPI) ADMISSION 05/25/2022 This is a 51 year old woman who is found to have an abnormal mammogram of her left breast earlier this year showing a 13 mm group of calcifications in the upper quadrant of the left breast. 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 She underwent a left lumpectomy on 11/12/2021. The patient states the wound never really healed and was open. She underwent radiation therapy from 12/10/2021 through 01/19/22 28 fractions of 1.8GY. Essentially the wound would not heal. She was treated several times with antibiotics finally on  03/25/2022 she was taken to the OR by Dr. Barry Dienes for excision of the wound in a sinus. Operative cultures grew strep and Prevotella and a culture from the clinic Livedo MRSA. It was recommended for 4 weeks of Augmentin and doxycycline by infectious disease. She was seen by wound care and she has been treating the wound with Medihoney ever since. 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 She was on meth methotrexate 20 mg once a week however this I think was put on hold because of the wound followed by Dr. Posey Pronto of rheumatology. She has asthma. Most recent breast ultrasound was on 9/7 She has a large nonhealing wound on the lateral aspect of her left breast 04/30/2022: Although  I do not have a photograph to compare to last week, the RN report is that it is substantially cleaner. She still has ample slough and nonviable fat and subcutaneous tissue present. She only was able to get her Santyl on Monday so she has just had a couple of days of treatment. 05/08/2022: The wound dimensions are about the same, but the surface is substantially cleaner. There is just a little bit of slough accumulation on the surface. 05/18/2022: The wound is a little bit smaller, but not much. It is quite a bit cleaner, however. Patient History Information obtained from Patient. Social History Never smoker, Marital Status - Married, Alcohol Use - Moderate, Drug Use - No History, Caffeine Use - Daily. Medical History Hematologic/Lymphatic Patient has history of Lymphedema - Left breast-gets Lymphadema tx Hospitalization/Surgery History - 03/25/22 Left Breast cyst excision; 11/12/21 Left Breast Lumpectomy with Radioactive seeds;Left breat Biopsy;Anterior. 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 12/08/21- 01/29/22- 37 rounds of Radiation Psychiatric General Anxiety Objective Constitutional No acute distress.. Vitals Time Taken: 9:33 AM, Height: 63 in, Weight: 170 lbs, BMI: 30.1, Temperature: 98.4 F, Pulse: 92 bpm, Respiratory Rate: 18 breaths/min, Blood Pressure: 115/77 mmHg. Respiratory Normal work of breathing on room air.Marland Kitchen [name], [name] ([id number]) [id number] Page 6 of 8 General Notes: 05/18/2022: The wound is a little bit smaller, but not much. It is quite a bit cleaner, however. Integumentary (Hair, Skin) Wound #1 status is Open. Original cause of wound was Surgical Injury. The date acquired was: 04/03/2022. The wound has been in treatment 3 weeks. The wound is located on the Left Breast. The wound measures 2.9cm length x 5.1cm width x 0.3cm depth; 11.616cm^2 area and 3.485cm^3 volume. There is Fat Layer (Subcutaneous Tissue) exposed. There is no tunneling or undermining noted. There is a medium amount of serosanguineous drainage noted. The wound margin is distinct with the outline attached to the wound base. There is small (1-33%) red granulation within the wound bed. There is a large (67-100%) amount of necrotic tissue within the wound bed including Adherent Slough. The periwound skin appearance had no abnormalities noted for texture. The periwound skin appearance had no abnormalities noted for moisture. The periwound skin appearance had no abnormalities noted for color. Periwound temperature was noted as No Abnormality. Assessment Active Problems ICD-10 Disruption of external operation (surgical) wound, not elsewhere classified, subsequent encounter Other specified disorders of the skin and subcutaneous tissue related to radiation Unspecified  open wound of left breast, subsequent encounter Intraductal carcinoma in situ of left breast Procedures Wound #1 Pre-procedure diagnosis of Wound #1 is a Malignant Wound located on the Left Breast . There was a Selective/Open Wound Non-Viable Tissue Debridement with a total area of 14.79 sq cm performed by [name], MD. With the following instrument(s): Curette to remove Non-Viable tissue/material. Material removed includes [facility] after achieving pain control using Lidocaine 4% Topical Solution. No specimens were taken. A time out was conducted at 09:45, prior to the start of the procedure. A Minimum amount of bleeding was controlled with Pressure. The procedure was tolerated well with a pain level of 0 throughout and a pain level of 0 following the procedure. Post Debridement Measurements: 2.9cm length x 5.1cm width x 0.3cm depth; 3.485cm^3 volume. Character of Wound/Ulcer Post Debridement is improved. Post procedure Diagnosis Wound #1: Same as Pre-Procedure General Notes: Scribed for Dr Celine Ahr by Sharyn Creamer, RN.  Plan Follow-up Appointments: Return Appointment in 1 week. - Dr. Celine Ahr Room 3 Anesthetic: (In clinic) Topical Lidocaine 4% applied to wound bed - used in Clinic Bathing/ Shower/ Hygiene: Other Bathing/Shower/Hygiene Orders/Instructions: - Change dressing after bathing Edema Control - Lymphedema / SCD / Other: Other Edema Control Orders/Instructions: - Keep doing the Lymphadema checks The following medication(s) was prescribed: lidocaine topical 4 % cream cream topical for prior to debridement was prescribed at facility WOUND #1: - Breast Wound Laterality: Left Cleanser: Normal Saline (Generic) 1 x Per Day/30 Days Discharge Instructions: Cleanse the wound with Normal Saline prior to applying a clean dressing using gauze sponges, not tissue or cotton balls. Cleanser: Soap and Water 1 x Per Day/30 Days Discharge Instructions: May shower and wash wound with dial  antibacterial soap and water prior to dressing change. Cleanser: Byram Ancillary Kit - 15 Day Supply (Generic) 1 x Per Day/30 Days Discharge Instructions: Use supplies as instructed; Kit contains: (15) Saline Bullets; (15) 3x3 Gauze; 15 pr Gloves Prim Dressing: Santyl Ointment (Generic) 1 x Per Day/30 Days ary Discharge Instructions: Apply nickel thick amount to wound bed as instructed Secondary Dressing: ABD Pad, 5x9 (Generic) 1 x Per Day/30 Days Discharge Instructions: Apply over primary dressing as directed. Secondary Dressing: Woven Gauze Sponge, Non-Sterile 4x4 in (Generic) 1 x Per Day/30 Days Discharge Instructions: Apply saline moistened gauze over the Santyl in the wound bed Secured With: 49M Medipore Soft Cloth Surgical T 2x10 (in/yd) (Generic) 1 x Per Day/30 Days ape Discharge Instructions: Secure with tape as directed. 05/18/2022: The wound is a little bit smaller, but not much. It is quite a bit cleaner, however. I used a curette to debride slough from the wound surface. I also worked on the edges, as they are starting to roll a bit. We will continue Santyl for now; I am hopeful that in the next couple weeks however, we will be able to move on to collagen or similar. She may also be a good candidate for skin substitute. Follow- up in 1 week. Electronic Signature(s) Signed: 05/18/2022 9:53:07 AM By: [name] MD FACS [name], [name] ([id number]) By: [name] MD FACS [phone].pdf Page 7 of 8 Signed: 05/18/2022 9:53:07 AM Entered By: [name] on 05/18/2022 09:53:07 -------------------------------------------------------------------------------- HxROS Details Patient Name: Date of Service: [name] RD, [place] W. 05/18/2022 9:30 A M Medical Record Number: [id number] Patient Account Number: [id number] Date of Birth/Sex: Treating RN: 1971-05-06 (51 y.o. F) Primary Care Provider: [name] Other Clinician: Referring  Provider: Treating Provider/Extender: [name], [name] Weeks in Treatment: 3 Information Obtained From Patient Ear/Nose/Mouth/Throat Medical History: Past Medical History Notes: Left ear limited hearing Hematologic/Lymphatic Medical History: Positive for: Lymphedema - Left breast-gets Lymphadema tx Gastrointestinal Medical History: Past Medical History Notes: GERD Immunological Medical History: Past Medical History Notes: Wegener"s Granulomatosis Musculoskeletal Medical History: Past Medical History Notes: "RA like arthritis" as per patient Oncologic Medical History: Past Medical History Notes: Left Breast. 12/08/21- 01/29/22- 37 rounds of Radiation Psychiatric Medical History: Past Medical History Notes: General Anxiety Immunizations Pneumococcal Vaccine: Received Pneumococcal Vaccination: Yes Received Pneumococcal Vaccination On or After 60th Birthday: No Implantable Devices Yes Hospitalization / Surgery History Type of Hospitalization/Surgery 03/25/22 Left Breast cyst excision; 11/12/21 Left Breast Lumpectomy with Radioactive seeds;Left breat Biopsy;Anterior Family and Social History AIMIE, WAGMAN ([id number]) [id number] Page 8 of 8 Never smoker; Marital Status - Married; Alcohol Use: Moderate; Drug Use: No History; Caffeine Use: Daily; Financial Concerns: No; Food, Clothing or Shelter Needs: No; Support System Lacking:  No; Transportation Concerns: No Electronic Signature(s) Signed: 05/18/2022 10:59:09 AM By: [name] MD FACS Entered By: [name] on 05/18/2022 09:51:44 -------------------------------------------------------------------------------- SuperBill Details Patient Name: Date of Service: [name] RD, [name] 05/18/2022 Medical Record Number: [id number] Patient Account Number: [id number] Date of Birth/Sex: Treating RN: 08-03-1970 (51 y.o. F) Primary Care Provider: [name] Other  Clinician: Referring Provider: Treating Provider/Extender: [name], [name] Weeks in Treatment: 3 Diagnosis Coding ICD-10 Codes Code Description T81.31XD Disruption of external operation (surgical) wound, not elsewhere classified, subsequent encounter 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 S21.002D Unspecified open wound of left breast, subsequent encounter 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 Facility Procedures : CPT4 Code: [id number] Description: [id number] - DEBRIDE WOUND 1ST 20 SQ CM OR < ICD-10 Diagnosis Description S21.002D Unspecified open wound of left breast, subsequent encounter T81.31XD Disruption of external operation (surgical) wound, not elsewhere classified, subs Modifier: equent encounter Quantity: 1 Physician Procedures : CPT4 Code Description Modifier [id number] - WC PHYS LEVEL 4 - EST PT 25 ICD-10 Diagnosis Description S21.002D Unspecified open wound of left breast, subsequent encounter T81.31XD Disruption of external operation (surgical) wound, not elsewhere  classified, subsequent encounter 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 Quantity: 1 : [id number] - WC PHYS DEBR WO ANESTH 20 SQ CM ICD-10 Diagnosis Description S21.002D Unspecified open wound of left breast, subsequent encounter T81.31XD Disruption of external operation (surgical) wound, not elsewhere classified, subsequent encounter Quantity: 1 Electronic Signature(s) Signed: 05/18/2022 9:53:34 AM By: [name] MD FACS Entered By: [name] on 05/18/2022 09:53:34

## 2022-05-25 ENCOUNTER — Encounter (HOSPITAL_BASED_OUTPATIENT_CLINIC_OR_DEPARTMENT_OTHER): Payer: 59 | Admitting: General Surgery

## 2022-05-25 DIAGNOSIS — T8131XA Disruption of external operation (surgical) wound, not elsewhere classified, initial encounter: Secondary | ICD-10-CM | POA: Diagnosis not present

## 2022-05-25 DIAGNOSIS — M313 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: Secondary | ICD-10-CM | POA: Diagnosis not present

## 2022-05-25 DIAGNOSIS — T8131XD Disruption of external operation (surgical) wound, not elsewhere classified, subsequent encounter: Secondary | ICD-10-CM | POA: Diagnosis not present

## 2022-05-25 DIAGNOSIS — K219 Gastro-esophageal reflux disease without esophagitis: Secondary | ICD-10-CM | POA: Diagnosis not present

## 2022-05-25 DIAGNOSIS — C50912 Malignant neoplasm of unspecified site of left female breast: Secondary | ICD-10-CM | POA: Diagnosis not present

## 2022-05-25 DIAGNOSIS — J45909 Unspecified asthma, uncomplicated: Secondary | ICD-10-CM | POA: Diagnosis not present

## 2022-05-25 DIAGNOSIS — S21002A Unspecified open wound of left breast, initial encounter: Secondary | ICD-10-CM | POA: Diagnosis not present

## 2022-05-25 NOTE — Progress Notes (Signed)
[name], [name] ([id number]) [id number].pdf Page 1 of 6 Visit Report for 05/25/2022 Arrival Information Details Patient Name: Date of Service: [name] [name] 05/25/2022 9:30 A M Medical Record Number: [id number] Patient Account Number: [id number] Date of Birth/Sex: Treating RN: [birth year] (51 y.o. F) Zochol, [name] Primary Care [name]: [name] Other Clinician: Referring [name]: Treating Rumaldo [name]/Extender: [name] in Treatment: 4 Visit Information History Since Last Visit Added or deleted any medications: No Patient Arrived: Ambulatory Any new allergies or adverse reactions: No Arrival Time: 09:39 Had a fall or experienced change in No Accompanied By: self activities of daily living that may affect Transfer Assistance: None risk of falls: Patient Identification Verified: Yes Signs or symptoms of abuse/neglect since last visito No Secondary Verification Process Completed: Yes Hospitalized since last visit: No Patient Requires Transmission-Based Precautions: No Implantable device outside of the clinic excluding No Patient Has Alerts: No cellular tissue based products placed in the center since last visit: Has Dressing in Place as Prescribed: Yes Pain Present Now: No Electronic Signature(s) Signed: 05/25/2022 4:43:27 PM By: [name] East RN Entered By: [name] on 05/25/2022 09:39:57 -------------------------------------------------------------------------------- Encounter Discharge Information Details Patient Name: Date of Service: [name], [name] 05/25/2022 9:30 A M Medical Record Number: [id number] Patient Account Number: [id number] Date of Birth/Sex: Treating RN: [birth year] (51 y.o. [name] Primary Care Trenace Coughlin: [name] Other Clinician: Referring Eretria Manternach: Treating Erisa Mehlman/Extender: [name] in Treatment: 4 Encounter Discharge Information Items  Post Procedure Vitals Discharge Condition: Stable Temperature (F): 98.5 Ambulatory Status: Ambulatory Pulse (bpm): 82 Discharge Destination: Home Respiratory Rate (breaths/min): 16 Transportation: Private Auto Blood Pressure (mmHg): 129/83 Accompanied By: self Schedule Follow-up Appointment: Yes Clinical Summary of Care: Patient Declined Electronic Signature(s) Signed: 05/25/2022 12:55:46 PM By: [name] Catholic RN Entered By: [name] on 05/25/2022 12:55:25 [name] ([id number]) [id number] Page 2 of 6 -------------------------------------------------------------------------------- Lower Extremity Assessment Details Patient Name: Date of Service: [name] [name] 05/25/2022 9:30 A M Medical Record Number: [id number] Patient Account Number: [id number] Date of Birth/Sex: Treating RN: [birth year] (51 y.o. [name] Primary Care [name]: [name] Other Clinician: Referring [name]: Treating [name]/Extender: [name], [name] in Treatment: 4 Electronic Signature(s) Signed: 05/25/2022 4:43:27 PM By: [name] East RN Entered By: [name] on 05/25/2022 09:40:39 -------------------------------------------------------------------------------- Multi Wound Chart Details Patient Name: Date of Service: [name], [name] 05/25/2022 9:30 A M Medical Record Number: [id number] Patient Account Number: [id number] Date of Birth/Sex: Treating RN: May 07, 1971 (51 y.o. F) Primary Care [name]: [name] Other Clinician: Referring [name]: Treating [name]/Extender: [name], [name] in Treatment: 4 Vital Signs Height(in): 63 Pulse(bpm): 82 Weight(lbs): 170 Blood Pressure(mmHg): 129/83 Body Mass Index(BMI): 30.1 Temperature(F): 98.5 Respiratory Rate(breaths/min): 16 [1:Photos:] [N/A:N/A] Left Breast N/A N/A Wound Location: Surgical Injury N/A N/A Wounding Event: Malignant  Wound N/A N/A Primary Etiology: Lymphedema N/A N/A Comorbid History: 04/03/2022 N/A N/A Date Acquired: 4 N/A N/A [name] of Treatment: Open N/A N/A Wound Status: No N/A N/A Wound Recurrence: 2.8x4.8x0.3 N/A N/A Measurements L x W x D (cm) 10.556 N/A N/A A (cm) : rea 3.167 N/A N/A Volume (cm) : 20.80% N/A N/A % Reduction in A rea: 40.60% N/A N/A % Reduction in Volume: Full Thickness Without Exposed N/A N/A Classification: Support Structures Medium N/A N/A Exudate Amount: Serosanguineous N/A N/A Exudate Type: red, [name] N/A N/A Exudate Color: Distinct, outline attached N/A N/A Wound Margin: Small (1-33%) N/A N/A Granulation Amount: Red N/A  N/A Granulation Quality: Large (67-100%) N/A N/A Necrotic Amount: VON, INSCOE ([id number]) [phone].pdf Page 3 of 6 Fat Layer (Subcutaneous Tissue): Yes N/A N/A Exposed Structures: Fascia: No Tendon: No Muscle: No Joint: No Bone: No None N/A N/A Epithelialization: Debridement - Selective/Open Wound N/A N/A Debridement: Pre-procedure Verification/Time Out 10:14 N/A N/A Taken: Lidocaine 5% topical ointment N/A N/A Pain Control: Slough N/A N/A Tissue Debrided: Non-Viable Tissue N/A N/A Level: 13.44 N/A N/A Debridement A (sq cm): rea Curette N/A N/A Instrument: Minimum N/A N/A Bleeding: Pressure N/A N/A Hemostasis A chieved: Procedure was tolerated well N/A N/A Debridement Treatment Response: 2.8x4.8x0.1 N/A N/A Post Debridement Measurements L x W x D (cm) 1.[name] N/A N/A Post Debridement Volume: (cm) No Abnormalities Noted N/A N/A Periwound Skin Texture: No Abnormalities Noted N/A N/A Periwound Skin Moisture: No Abnormalities Noted N/A N/A Periwound Skin Color: No Abnormality N/A N/A Temperature: Debridement N/A N/A Procedures Performed: Treatment Notes Electronic Signature(s) Signed: 05/25/2022 10:31:38 AM By: [name] Maudlin MD FACS Entered By: [name] on  05/25/2022 10:31:37 -------------------------------------------------------------------------------- Multi-Disciplinary Care Plan Details Patient Name: Date of Service: [name], [place] W. 05/25/2022 9:30 A M Medical Record Number: [id number] Patient Account Number: [id number] Date of Birth/Sex: Treating RN: 1970/10/17 (51 y.o. [name] Primary Care Carsin Randazzo: [name] Other Clinician: Referring Syvilla Martin: Treating Doshie Maggi/Extender: [name], [name] in Treatment: 4 Active Inactive Wound/Skin Impairment Nursing Diagnoses: Impaired tissue integrity Goals: Patient/caregiver will verbalize understanding of skin care regimen Date Initiated: 04/24/2022 Target Resolution Date: 07/05/2022 Goal Status: Active Interventions: Assess ulceration(s) every visit Treatment Activities: Skin care regimen initiated : 04/24/2022 Notes: Electronic Signature(s) Signed: 05/25/2022 4:43:27 PM By: [name] East RN Entered By: [name] on 05/25/2022 10:17:58 [name] ([id number]) [id number] Page 4 of 6 -------------------------------------------------------------------------------- Pain Assessment Details Patient Name: Date of Service: [name] [name] 05/25/2022 9:30 A M Medical Record Number: [id number] Patient Account Number: [id number] Date of Birth/Sex: Treating RN: 11-14-1970 (51 y.o. [name] Primary Care Aissa Lisowski: [name] Other Clinician: Referring Ketty Bitton: Treating Blannie Shedlock/Extender: [name], [name] in Treatment: 4 Active Problems Location of Pain Severity and Description of Pain Patient Has Paino No Site Locations Rate the pain. Current Pain Level: 0 Pain Management and Medication Current Pain Management: Electronic Signature(s) Signed: 05/25/2022 4:43:27 PM By: [name] East RN Entered By: [name] on 05/25/2022  09:40:31 -------------------------------------------------------------------------------- Patient/Caregiver Education Details Patient Name: Date of Service: [name] 11/20/2023andnbsp9:30 [place] Record Number: [id number] Patient Account Number: [id number] Date of Birth/Gender: Treating RN: 1971/04/20 (51 y.o. [name] Primary Care Physician: [name] Other Clinician: Referring Physician: Treating Physician/Extender: [name] in Treatment: 4 Education Assessment Education Provided To: Patient Education Topics Provided Wound/Skin Impairment: Methods: Explain/Verbal Responses: Reinforcements needed, State content correctly [name], [name] ([id number]) [phone].pdf Page 5 of 6 Electronic Signature(s) Signed: 05/25/2022 4:43:27 PM By: [name] East RN Entered By: [name] on 05/25/2022 10:18:17 -------------------------------------------------------------------------------- Wound Assessment Details Patient Name: Date of Service: [name] 05/25/2022 9:30 A M Medical Record Number: [id number] Patient Account Number: [id number] Date of Birth/Sex: Treating RN: July 10, 1970 (51 y.o. [name] Primary Care Felita Bump: [name] Other Clinician: Referring Janiel Crisostomo: Treating Ciani Rutten/Extender: [name], [name] in Treatment: 4 Wound Status Wound Number: 1 Primary Etiology: Malignant Wound Wound Location: Left Breast Wound Status: Open Wounding Event: Surgical Injury Comorbid History: Lymphedema Date Acquired: 04/03/2022 [name] Of Treatment: 4 Clustered Wound: No Photos Wound Measurements Length: (  cm) 2.8 Width: (cm) 4.8 Depth: (cm) 0.3 Area: (cm) 10.556 Volume: (cm) 3.167 % Reduction in Area: 20.8% % Reduction in Volume: 40.6% Epithelialization: None Tunneling: No Undermining: No Wound Description Classification: Full Thickness Without Exposed  Suppor Wound Margin: Distinct, outline attached Exudate Amount: Medium Exudate Type: Serosanguineous Exudate Color: red, [name] t Structures Foul Odor After Cleansing: No Slough/Fibrino Yes Wound Bed Granulation Amount: Small (1-33%) Exposed Structure Granulation Quality: Red Fascia Exposed: No Necrotic Amount: Large (67-100%) Fat Layer (Subcutaneous Tissue) Exposed: Yes Necrotic Quality: Adherent Slough Tendon Exposed: No Muscle Exposed: No Joint Exposed: No Bone Exposed: No Periwound Skin Texture Texture Color No Abnormalities Noted: Yes No Abnormalities Noted: Yes Moisture Temperature / Pain No Abnormalities Noted: Yes Temperature: No Abnormality [name], [name] ([id number]) [id number] Page 6 of 6 Treatment Notes Wound #1 (Breast) Wound Laterality: Left Cleanser Normal Saline Discharge Instruction: Cleanse the wound with Normal Saline prior to applying a clean dressing using gauze sponges, not tissue or cotton balls. Soap and Water Discharge Instruction: May shower and wash wound with dial antibacterial soap and water prior to dressing change. [place] Ancillary Kit - 15 Day Supply Discharge Instruction: Use supplies as instructed; Kit contains: (15) Saline Bullets; (15) 3x3 Gauze; 15 pr Gloves Peri-Wound Care Topical Primary Dressing Santyl Ointment Discharge Instruction: Apply nickel thick amount to wound bed as instructed Secondary Dressing ABD Pad, 5x9 Discharge Instruction: Apply over primary dressing as directed. Woven Gauze Sponge, Non-Sterile 4x4 in Discharge Instruction: Apply saline moistened gauze over the Santyl in the wound bed Secured With [place] [facility] T 2x10 (in/yd) ape Discharge Instruction: Secure with tape as directed. Compression Wrap Compression Stockings Add-Ons Electronic Signature(s) Signed: 05/25/2022 4:43:27 PM By: [name] East RN Entered By: [name] on 05/25/2022  09:45:48 -------------------------------------------------------------------------------- Vitals Details Patient Name: Date of Service: [name], [place] W. 05/25/2022 9:30 A M Medical Record Number: [id number] Patient Account Number: [id number] Date of Birth/Sex: Treating RN: Oct 01, 1970 (51 y.o. [name], [name] Primary Care Cynda Soule: [name] Other Clinician: Referring Takirah Binford: Treating Damyra Luscher/Extender: [name], [name] in Treatment: 4 Vital Signs Time Taken: 09:39 Temperature (F): 98.5 Height (in): 63 Pulse (bpm): 82 Weight (lbs): 170 Respiratory Rate (breaths/min): 16 Body Mass Index (BMI): 30.1 Blood Pressure (mmHg): 129/83 Reference Range: 80 - 120 mg / dl Electronic Signature(s) Signed: 05/25/2022 4:43:27 PM By: [name] East RN Entered By: [name] on 05/25/2022 09:40:11

## 2022-05-25 NOTE — Progress Notes (Signed)
[name], [facility] ([id number]) [id number] Page 1 of 8 Visit Report for 05/25/2022 Chief Complaint Document Details Patient Name: Date of Service: [name] 05/25/2022 9:30 A M Medical Record Number: [id number] Patient Account Number: [id number] Date of Birth/Sex: Treating RN: 02-Oct-1970 (51 y.o. F) Primary Care Provider: [name] Other Clinician: Referring Provider: Treating Provider/Extender: [name], [name] Weeks in Treatment: 4 Information Obtained from: Patient Chief Complaint 04/24/2022; patient is here for a surgical wound on her left lateral breast Electronic Signature(s) Signed: 05/25/2022 10:32:04 AM By: [name] MD FACS Entered By: [name] on 05/25/2022 10:32:03 -------------------------------------------------------------------------------- Debridement Details Patient Name: Date of Service: Unk [name] [name] 05/25/2022 9:30 A M Medical Record Number: [id number] Patient Account Number: [id number] Date of Birth/Sex: Treating RN: [birth year] (51 y.o. [name], [name] Primary Care Provider: [name] Other Clinician: Referring Provider: Treating Provider/Extender: Shea Stakes in Treatment: 4 Debridement Performed for Assessment: Wound #1 Left Breast Performed By: Physician [name], MD Debridement Type: Debridement Level of Consciousness (Pre-procedure): Awake and Alert Pre-procedure Verification/Time Out Yes - 10:14 Taken: Start Time: 10:15 Pain Control: Lidocaine 5% topical ointment T Area Debrided (L x [name]): otal 2.8 (cm) x 4.8 (cm) = 13.44 (cm) Tissue and other material debrided: Non-Viable, Slough, Slough Level: Non-Viable Tissue Debridement Description: Selective/Open Wound Instrument: Curette Bleeding: Minimum Hemostasis Achieved: Pressure Response to Treatment: Procedure was tolerated well Level of Consciousness (Post- Awake and  Alert procedure): Post Debridement Measurements of Total Wound Length: (cm) 2.8 Width: (cm) 4.8 Depth: (cm) 0.1 Volume: (cm) 1.056 Character of Wound/Ulcer Post Debridement: Requires Further Debridement Post Procedure Diagnosis Same as Pre-procedure [name], [name] ([id number]) [id number] Page 2 of 8 Notes Scribed for Dr. Celine Ahr by Blanche East, RN Electronic Signature(s) Signed: 05/25/2022 11:39:09 AM By: [name] MD FACS Signed: 05/25/2022 4:43:27 PM By: Blanche East RN Entered By: Blanche East on 05/25/2022 10:18:41 -------------------------------------------------------------------------------- HPI Details Patient Name: Date of Service: [name], [place] [name] 05/25/2022 9:30 A M Medical Record Number: [id number] Patient Account Number: [id number] Date of Birth/Sex: Treating RN: October 03, 1970 (51 y.o. F) Primary Care Provider: [name] Other Clinician: Referring Provider: Treating Provider/Extender: [name], [name] Weeks in Treatment: 4 History of Present Illness HPI Description: ADMISSION 05/25/2022 This is a 51 year old woman who is found to have an abnormal mammogram of her left breast earlier this year showing a 13 mm group of calcifications in the upper quadrant of the left breast. 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 She underwent a left lumpectomy on 11/12/2021. The patient states the wound never really healed and was open. She underwent radiation therapy from 12/10/2021 through 01/19/22 28 fractions of 1.8GY. Essentially the wound would not heal. She was treated several times with antibiotics finally on 03/25/2022 she was taken to the OR by Dr. Barry Dienes for excision of the wound in a sinus. Operative cultures grew strep and Prevotella and a culture from the clinic Livedo MRSA. It was recommended for 4 weeks of Augmentin and doxycycline by  infectious disease. She was seen by wound care and she has been treating the wound with Medihoney ever since. 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 She was on meth methotrexate 20  mg once a week however this I think was put on hold because of the wound followed by Dr. Posey Pronto of rheumatology. She has asthma. Most recent breast ultrasound was on 9/7 She has a large nonhealing wound on the lateral aspect of her left breast 04/30/2022: Although I do not have a photograph to compare to last week, the RN report is that it is substantially cleaner. She still has ample slough and nonviable fat and subcutaneous tissue present. She only was able to get her Santyl on Monday so she has just had a couple of days of treatment. 05/08/2022: The wound dimensions are about the same, but the surface is substantially cleaner. There is just a little bit of slough accumulation on the surface. 05/18/2022: The wound is a little bit smaller, but not much. It is quite a bit cleaner, however. 05/25/2022: The wound surface continues to improve. Very little slough accumulation this week. No substantial change in the wound dimensions. Electronic Signature(s) Signed: 05/25/2022 10:32:30 AM By: [name] MD FACS Entered By: [name] on 05/25/2022 10:32:30 -------------------------------------------------------------------------------- Physical Exam Details Patient Name: Date of Service: [name] 05/25/2022 9:30 A M Medical Record Number: [id number] Patient Account Number: [id number] Date of Birth/Sex: Treating RN: 1970/10/07 (51 y.o. F) Primary Care Provider: [name] Other Clinician: Referring Provider: Treating Provider/Extender: [name], [name] Weeks in Treatment: 4 Constitutional . . . . No acute distress. [name], [name] ([id number]) [id number] Page 3 of 8 Respiratory Normal work of breathing on room air. Notes 05/25/2022: The wound surface continues to improve. Very little slough accumulation this week. No substantial change in the wound dimensions. Electronic Signature(s) Signed: 05/25/2022 10:33:26 AM By: [name] MD FACS Entered By: [name] on 05/25/2022 10:33:26 -------------------------------------------------------------------------------- Physician Orders Details Patient Name: Date of Service: [name] 05/25/2022 9:30 A M Medical Record Number: [id number] Patient Account Number: [id number] Date of Birth/Sex: Treating RN: 09/02/1970 (51 y.o. [name], [name] Primary Care Provider: [name] Other Clinician: Referring Provider: Treating Provider/Extender: Shea Stakes in Treatment: 4 Verbal / Phone Orders: No Diagnosis Coding ICD-10 Coding Code Description T81.31XD Disruption of external operation (surgical) wound, not elsewhere classified, subsequent encounter 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 S21.002D Unspecified open wound of left breast, subsequent encounter 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 Follow-up Appointments ppointment in 1 week. - Dr. Celine Ahr Room 3 Return A Anesthetic (In clinic) Topical Lidocaine 4% applied to wound bed - used in Clinic Bathing/ Shower/ Hygiene Other Bathing/Shower/Hygiene Orders/Instructions: - Change dressing after bathing Edema Control - Lymphedema / SCD / Other Other Edema Control Orders/Instructions: - Keep doing the Lymphadema checks Wound Treatment Wound #1 - Breast Wound Laterality: Left Cleanser: Normal Saline (Generic) 1 x Per Day/30 Days Discharge Instructions: Cleanse the wound with  Normal Saline prior to applying a clean dressing using gauze sponges, not tissue or cotton balls. Cleanser: Soap and Water 1 x Per Day/30 Days Discharge Instructions: May shower and wash wound with dial antibacterial soap and water prior to dressing change. Cleanser: Byram Ancillary Kit - 15 Day Supply (Generic) 1 x Per Day/30 Days Discharge Instructions: Use supplies as instructed; Kit contains: (15) Saline Bullets; (15) 3x3 Gauze; 15 pr Gloves Prim Dressing: Santyl Ointment (Generic) 1 x Per Day/30 Days ary Discharge Instructions: Apply nickel thick amount to wound bed as instructed Secondary Dressing: ABD Pad, 5x9 (Generic) 1 x Per Day/30 Days Discharge Instructions:  Apply over primary dressing as directed. Secondary Dressing: Woven Gauze Sponge, Non-Sterile 4x4 in (Generic) 1 x Per Day/30 Days Discharge Instructions: Apply saline moistened gauze over the Santyl in the wound bed Secured With: 57M Medipore Soft Cloth Surgical T 2x10 (in/yd) (Generic) 1 x Per Day/30 Days ape Discharge Instructions: Secure with tape as directed. [name], [name] ([id number]) [id number] Page 4 of 8 Electronic Signature(s) Signed: 05/25/2022 11:39:09 AM By: [name] MD FACS Entered By: [name] on 05/25/2022 10:33:36 -------------------------------------------------------------------------------- Problem List Details Patient Name: Date of Service: [name], [name] 05/25/2022 9:30 A M Medical Record Number: [id number] Patient Account Number: [id number] Date of Birth/Sex: Treating RN: August 11, 1970 (51 y.o. F) Primary Care Provider: [name] Other Clinician: Referring Provider: Treating Provider/Extender: [name], [name] Weeks in Treatment: 4 Active Problems ICD-10 Encounter Code Description Active Date MDM Diagnosis T81.31XD Disruption of external operation (surgical) wound, not elsewhere classified, 04/24/2022 No Yes subsequent  encounter L59.8 Other specified disorders of the skin and subcutaneous tissue related to 04/24/2022 No Yes radiation S21.002D Unspecified open wound of left breast, subsequent encounter 04/24/2022 No Yes 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 04/24/2022 No Yes Inactive Problems Resolved Problems Electronic Signature(s) Signed: 05/25/2022 10:31:31 AM By: [name] MD FACS Entered By: [name] on 05/25/2022 10:31:31 -------------------------------------------------------------------------------- Progress Note Details Patient Name: Date of Service: Unk [name] [name] 05/25/2022 9:30 A M Medical Record Number: [id number] Patient Account Number: [id number] Date of Birth/Sex: Treating RN: 1970-12-22 (51 y.o. F) Primary Care Provider: [name] Other Clinician: Referring Provider: Treating Provider/Extender: Shea Stakes in Treatment: 4 Subjective Chief Complaint Information obtained from Patient [name], [name] ([id number]) [id number] Page 5 of 8 04/24/2022; patient is here for a surgical wound on her left lateral breast History of Present Illness (HPI) ADMISSION 05/25/2022 This is a 51 year old woman who is found to have an abnormal mammogram of her left breast earlier this year showing a 13 mm group of calcifications in the upper quadrant of the left breast. 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 She underwent a left lumpectomy on 11/12/2021. The patient states the wound never really healed and was open. She underwent radiation therapy from 12/10/2021 through 01/19/22 28 fractions of 1.8GY. Essentially the wound would not heal. She was treated several times with antibiotics finally on 03/25/2022 she was taken to the OR by Dr. Barry Dienes for excision of the wound in a sinus. Operative cultures grew strep and Prevotella and a  culture from the clinic Livedo MRSA. It was recommended for 4 weeks of Augmentin and doxycycline by infectious disease. She was seen by wound care and she has been treating the wound with Medihoney ever since. 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 She was on meth methotrexate 20 mg once a week however this I think was put on hold because of the wound followed by Dr. Posey Pronto of rheumatology. She has asthma. Most recent breast ultrasound was on 9/7 She has a large nonhealing wound on the lateral aspect of her left breast 04/30/2022: Although I do not have a photograph to compare to last week, the RN report is that it is substantially cleaner. She still has ample slough and  nonviable fat and subcutaneous tissue present. She only was able to get her Santyl on Monday so she has just had a couple of days of treatment. 05/08/2022: The wound dimensions are about the same, but the surface is substantially cleaner. There is just a little bit of slough accumulation on the surface. 05/18/2022: The wound is a little bit smaller, but not much. It is quite a bit cleaner, however. 05/25/2022: The wound surface continues to improve. Very little slough accumulation this week. No substantial change in the wound dimensions. Patient History Information obtained from Patient. Social History Never smoker, Marital Status - Married, Alcohol Use - Moderate, Drug Use - No History, Caffeine Use - Daily. Medical History Hematologic/Lymphatic Patient has history of Lymphedema - Left breast-gets Lymphadema tx Hospitalization/Surgery History - 03/25/22 Left Breast cyst excision; 11/12/21 Left Breast Lumpectomy with Radioactive seeds;Left breat Biopsy;Anterior. 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 12/08/21- 01/29/22- 37 rounds of Radiation Psychiatric General Anxiety Objective Constitutional No acute distress. Vitals Time Taken: 9:39 AM, Height: 63 in, Weight: 170 lbs, BMI: 30.1, Temperature: 98.5 F, Pulse: 82 bpm, Respiratory Rate: 16 breaths/min, Blood Pressure: 129/83 mmHg. Respiratory Normal work of breathing on room air. General Notes: 05/25/2022: The wound surface continues to improve. Very little slough accumulation this week. No substantial change in the wound dimensions. Integumentary (Hair, Skin) Wound #1 status is Open. Original cause of wound was Surgical Injury. The date acquired was: 04/03/2022. The wound has been in treatment 4 weeks. The wound is located on the Left Breast. The wound measures 2.8cm length x 4.8cm width x 0.3cm depth; 10.556cm^2 area and 3.167cm^3 volume. There is Fat Layer (Subcutaneous Tissue) exposed. There is no tunneling or undermining noted. There is a medium amount of serosanguineous drainage noted. The wound margin is distinct with the outline attached to the wound base. There is small (1-33%) red granulation within the wound bed. There is a large (67-100%) amount of necrotic tissue within the wound bed including Adherent Slough. The periwound skin appearance had no abnormalities noted for texture. The periwound skin appearance had no abnormalities noted for moisture. The periwound skin appearance had no abnormalities noted for color. Periwound temperature was noted as No Abnormality. [name], [name] ([id number]) [id number] Page 6 of 8 Assessment Active Problems ICD-10 Disruption of external operation (surgical) wound, not elsewhere classified, subsequent encounter Other specified disorders of the skin and subcutaneous  tissue related to radiation Unspecified open wound of left breast, subsequent encounter Intraductal carcinoma in situ of left breast Procedures Wound #1 Pre-procedure diagnosis of Wound #1 is a Malignant Wound located on the Left Breast . There was a Selective/Open Wound Non-Viable Tissue Debridement with a total area of 13.44 sq cm performed by [name], MD. With the following instrument(s): Curette to remove Non-Viable tissue/material. Material removed includes [facility] after achieving pain control using Lidocaine 5% topical ointment. No specimens were taken. A time out was conducted at 10:14, prior to the start of the procedure. A Minimum amount of bleeding was controlled with Pressure. The procedure was tolerated well. Post Debridement Measurements: 2.8cm length x 4.8cm width x 0.1cm depth; 1.056cm^3 volume. Character of Wound/Ulcer Post Debridement requires further debridement. Post procedure Diagnosis Wound #1: Same as Pre-Procedure General Notes: Scribed for Dr. Celine Ahr by Blanche East, RN. Plan Follow-up Appointments: Return Appointment in 1 week. - Dr. Celine Ahr Room 3 Anesthetic: (In clinic) Topical Lidocaine 4%  applied to wound bed - used in Clinic Bathing/ Shower/ Hygiene: Other Bathing/Shower/Hygiene Orders/Instructions: - Change dressing after bathing Edema Control - Lymphedema / SCD / Other: Other Edema Control Orders/Instructions: - Keep doing the Lymphadema checks WOUND #1: - Breast Wound Laterality: Left Cleanser: Normal Saline (Generic) 1 x Per Day/30 Days Discharge Instructions: Cleanse the wound with Normal Saline prior to applying a clean dressing using gauze sponges, not tissue or cotton balls. Cleanser: Soap and Water 1 x Per Day/30 Days Discharge Instructions: May shower and wash wound with dial antibacterial soap and water prior to dressing change. Cleanser: Byram Ancillary Kit - 15 Day Supply (Generic) 1 x Per Day/30 Days Discharge Instructions: Use supplies  as instructed; Kit contains: (15) Saline Bullets; (15) 3x3 Gauze; 15 pr Gloves Prim Dressing: Santyl Ointment (Generic) 1 x Per Day/30 Days ary Discharge Instructions: Apply nickel thick amount to wound bed as instructed Secondary Dressing: ABD Pad, 5x9 (Generic) 1 x Per Day/30 Days Discharge Instructions: Apply over primary dressing as directed. Secondary Dressing: Woven Gauze Sponge, Non-Sterile 4x4 in (Generic) 1 x Per Day/30 Days Discharge Instructions: Apply saline moistened gauze over the Santyl in the wound bed Secured With: 66M Medipore Soft Cloth Surgical T 2x10 (in/yd) (Generic) 1 x Per Day/30 Days ape Discharge Instructions: Secure with tape as directed. 05/25/2022: The wound surface continues to improve. Very little slough accumulation this week. No substantial change in the wound dimensions. I used a curette to debride the slough from the wound surface. I think we will continue with 1 more week of Santyl. Next week, I am hoping to be able to change her to a collagen dressing. Follow-up in 1 week. Electronic Signature(s) Signed: 05/25/2022 10:34:07 AM By: [name] MD FACS Entered By: [name] on 05/25/2022 10:34:06 [name] ([id number]) [id number] Page 7 of 8 -------------------------------------------------------------------------------- HxROS Details Patient Name: Date of Service: [name], [name] 05/25/2022 9:30 A M Medical Record Number: [id number] Patient Account Number: [id number] Date of Birth/Sex: Treating RN: [birth year] (51 y.o. F) Primary Care Provider: [name] Other Clinician: Referring Provider: Treating Provider/Extender: [name], [name] Weeks in Treatment: 4 Information Obtained From Patient Ear/Nose/Mouth/Throat Medical History: Past Medical History Notes: Left ear limited hearing Hematologic/Lymphatic Medical History: Positive for: Lymphedema - Left breast-gets Lymphadema  tx Gastrointestinal Medical History: Past Medical History Notes: GERD Immunological Medical History: Past Medical History Notes: Wegener"s Granulomatosis Musculoskeletal Medical History: Past Medical History Notes: "RA like arthritis" as per patient Oncologic Medical History: Past Medical History Notes: Left Breast. 12/08/21- 01/29/22- 37 rounds of Radiation Psychiatric Medical History: Past Medical History Notes: General Anxiety Immunizations Pneumococcal Vaccine: Received Pneumococcal Vaccination: Yes Received Pneumococcal Vaccination On or After 60th Birthday: No Implantable Devices Yes Hospitalization / Surgery History Type of Hospitalization/Surgery 03/25/22 Left Breast cyst excision; 11/12/21 Left Breast Lumpectomy with Radioactive seeds;Left breat Biopsy;Anterior Family and Social History Never smoker; Marital Status - Married; Alcohol Use: Moderate; Drug Use: No History; Caffeine Use: Daily; Financial Concerns: No; Food, Clothing or Shelter Needs: No; Support System Lacking: No; Transportation Concerns: No Electronic Signature(s) Signed: 05/25/2022 11:39:09 AM By: [name] MD FACS Entered By: [name] on 05/25/2022 10:33:05 [name] ([id number]) [id number] Page 8 of 8 -------------------------------------------------------------------------------- SuperBill Details Patient Name: Date of Service: [name] 05/25/2022 Medical Record Number: [id number] Patient Account Number: [id number] Date of Birth/Sex: Treating RN: 10-29-1970 (51 y.o. F) Primary Care Provider: [name] Other Clinician: Referring Provider: Treating Provider/Extender: [name], [name] Weeks in Treatment:  4 Diagnosis Coding ICD-10 Codes Code Description T81.31XD Disruption of external operation (surgical) wound, not elsewhere classified, subsequent encounter L59.8 Other specified disorders of the skin and  subcutaneous tissue related to radiation S21.002D Unspecified open wound of left breast, subsequent encounter 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 Facility Procedures : CPT4 Code: [id number] Description: 75300 - DEBRIDE WOUND 1ST 20 SQ CM OR < ICD-10 Diagnosis Description S21.002D Unspecified open wound of left breast, subsequent encounter T81.31XD Disruption of external operation (surgical) wound, not elsewhere classified, subs L59.8 Other  specified disorders of the skin and subcutaneous tissue related to radiatio Modifier: equent encounter n Quantity: 1 Physician Procedures : CPT4 Code Description Modifier [id number] - WC PHYS LEVEL 4 - EST PT 25 ICD-10 Diagnosis Description S21.002D Unspecified open wound of left breast, subsequent encounter T81.31XD Disruption of external operation (surgical) wound, not elsewhere  classified, subsequent encounter 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 Quantity: 1 : [id number] - WC PHYS DEBR WO ANESTH 20 SQ CM ICD-10 Diagnosis Description S21.002D Unspecified open wound of left breast, subsequent encounter T81.31XD Disruption of external operation (surgical) wound, not elsewhere classified, subsequent encounter  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 Quantity: 1 Electronic Signature(s) Signed: 05/25/2022 10:34:29 AM By: [name] MD FACS Entered By: [name] on 05/25/2022 10:34:29

## 2022-06-01 ENCOUNTER — Ambulatory Visit: Payer: 59

## 2022-06-01 ENCOUNTER — Encounter (HOSPITAL_BASED_OUTPATIENT_CLINIC_OR_DEPARTMENT_OTHER): Payer: 59 | Admitting: General Surgery

## 2022-06-01 DIAGNOSIS — S21002A Unspecified open wound of left breast, initial encounter: Secondary | ICD-10-CM | POA: Diagnosis not present

## 2022-06-01 DIAGNOSIS — M313 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: Secondary | ICD-10-CM | POA: Diagnosis not present

## 2022-06-01 DIAGNOSIS — K219 Gastro-esophageal reflux disease without esophagitis: Secondary | ICD-10-CM | POA: Diagnosis not present

## 2022-06-01 DIAGNOSIS — T8131XD Disruption of external operation (surgical) wound, not elsewhere classified, subsequent encounter: Secondary | ICD-10-CM | POA: Diagnosis not present

## 2022-06-01 DIAGNOSIS — J45909 Unspecified asthma, uncomplicated: Secondary | ICD-10-CM | POA: Diagnosis not present

## 2022-06-01 DIAGNOSIS — L98492 Non-pressure chronic ulcer of skin of other sites with fat layer exposed: Secondary | ICD-10-CM | POA: Diagnosis not present

## 2022-06-01 NOTE — Progress Notes (Signed)
SHADOW, STIGGERS ([id number]) [id number] Page 1 of 7 Visit Report for 06/01/2022 Arrival Information Details Patient Name: Date of Service: [name] 06/01/2022 2:45 PM Medical Record Number: [id number] Patient Account Number: [id number] Date of Birth/Sex: Treating RN: January 02, 1971 (51 y.o. F) Primary Care Saliha Salts: [name] Other Clinician: Referring Que Meneely: Treating Vergil Burby/Extender: [name], [name] Weeks in Treatment: 5 Visit Information History Since Last Visit All ordered tests and consults were completed: No Patient Arrived: Ambulatory Added or deleted any medications: No Arrival Time: 14:56 Any new allergies or adverse reactions: No Accompanied By: self Had a fall or experienced change in No Transfer Assistance: None activities of daily living that may affect Patient Identification Verified: Yes risk of falls: Secondary Verification Process Completed: Yes Signs or symptoms of abuse/neglect since last visito No Patient Requires Transmission-Based Precautions: No Hospitalized since last visit: No Patient Has Alerts: No Implantable device outside of the clinic excluding No cellular tissue based products placed in the center since last visit: Pain Present Now: No Electronic Signature(s) Signed: 06/01/2022 3:17:55 PM By: Worthy Rancher Entered By: Worthy Rancher on 06/01/2022 14:56:42 -------------------------------------------------------------------------------- Encounter Discharge Information Details Patient Name: Date of Service: [name], [name] 06/01/2022 2:45 PM Medical Record Number: [id number] Patient Account Number: [id number] Date of Birth/Sex: Treating RN: 1971/03/17 (51 y.o. [name] Primary Care Onur Mori: [name] Other Clinician: Referring Kalifa Cadden: Treating Honora Searson/Extender: Shea Stakes in Treatment: 5 Encounter Discharge Information Items Post  Procedure Vitals Discharge Condition: Stable Temperature (F): 97.9 Ambulatory Status: Ambulatory Pulse (bpm): 80 Discharge Destination: Home Respiratory Rate (breaths/min): 18 Transportation: Private Auto Blood Pressure (mmHg): 130/93 Accompanied By: self Schedule Follow-up Appointment: Yes Clinical Summary of Care: Patient Declined Electronic Signature(s) Signed: 06/01/2022 5:35:27 PM By: Baruch Gouty RN, BSN Entered By: Baruch Gouty on 06/01/2022 15:33:17 [name] ([id number]) [id number] Page 2 of 7 -------------------------------------------------------------------------------- Lower Extremity Assessment Details Patient Name: Date of Service: [name] 06/01/2022 2:45 PM Medical Record Number: [id number] Patient Account Number: [id number] Date of Birth/Sex: Treating RN: 04-06-1971 (51 y.o. [name] Primary Care Letia Guidry: [name] Other Clinician: Referring Alinda Egolf: Treating Samaira Holzworth/Extender: [name], [name] Weeks in Treatment: 5 Electronic Signature(s) Signed: 06/01/2022 5:35:27 PM By: Baruch Gouty RN, BSN Entered By: Baruch Gouty on 06/01/2022 15:02:32 -------------------------------------------------------------------------------- Multi Wound Chart Details Patient Name: Date of Service: [name] Mattes. 06/01/2022 2:45 PM Medical Record Number: [id number] Patient Account Number: [id number] Date of Birth/Sex: Treating RN: [birth year] (51 y.o. F) Primary Care Serenidy Waltz: [name] Other Clinician: Referring Clairessa Boulet: Treating Cortasia Screws/Extender: [name], [name] Weeks in Treatment: 5 Vital Signs Height(in): 63 Pulse(bpm): 80 Weight(lbs): 170 Blood Pressure(mmHg): 130/93 Body Mass Index(BMI): 30.1 Temperature(F): 97.9 Respiratory Rate(breaths/min): 20 [1:Photos: No Photos Left Breast Wound Location: Surgical Injury Wounding Event: Malignant Wound Primary  Etiology: Lymphedema Comorbid History: 04/03/2022 Date Acquired: 5 Weeks of Treatment: Open Wound Status: No Wound Recurrence: 2.8x4.4x0.4 Measurements L x W x D  (cm) 9.676 A (cm) : rea 3.87 Volume (cm) : 27.40% % Reduction in A rea: 27.40% % Reduction in Volume: Full Thickness Without Exposed Classification: Support Structures Medium Exudate A mount: Serosanguineous Exudate Type: red, brown Exudate Color:  Distinct, outline attached Wound Margin: Small (1-33%) Granulation A mount: Red Granulation Quality: Large (67-100%) Necrotic A mount: Fat Layer (Subcutaneous Tissue): Yes N/A Exposed Structures: Fascia: No Tendon: No Muscle: No Joint: No Bone: No None  Epithelialization: Debridement - Excisional Debridement: Pre-procedure Verification/Time Out 15:12 Taken:] [N/A:N/A  N/A N/A N/A N/A N/A N/A N/A N/A N/A N/A N/A N/A N/A N/A N/A N/A N/A N/A N/A N/A N/A N/A N/A N/A] [name], [name] ([id number]) [1:Lidocaine 4% Topical Solution Pain Control: Fat, Subcutaneous, Slough Tissue Debrided: Skin/Subcutaneous Tissue Level: 12.32 Debridement A (sq cm): rea Curette Instrument: Minimum Bleeding: Pressure Hemostasis A chieved: 0 Procedural Pain: 1 Post  Procedural Pain: Procedure was tolerated well Debridement Treatment Response: 2.8x4.4x0.4 Post Debridement Measurements L x W x D (cm) 3.87 Post Debridement Volume: (cm) Induration: Yes Periwound Skin Texture: No Abnormalities Noted Periwound Skin  Moisture: No Abnormalities Noted Periwound Skin Color: No Abnormality Temperature: Debridement Procedures Performed:] [N/A:N/A N/A N/A N/A N/A N/A N/A N/A N/A N/A N/A N/A N/A N/A N/A N/A N/A] Treatment Notes Wound #1 (Breast) Wound Laterality: Left Cleanser Normal Saline Discharge Instruction: Cleanse the wound with Normal Saline prior to applying a clean dressing using gauze sponges, not tissue or cotton balls. Soap and Water Discharge Instruction: May shower and wash wound with dial antibacterial soap and water  prior to dressing change. Byram Ancillary Kit - 15 Day Supply Discharge Instruction: Use supplies as instructed; Kit contains: (15) Saline Bullets; (15) 3x3 Gauze; 15 pr Gloves Peri-Wound Care Topical Primary Dressing Santyl Ointment Discharge Instruction: Apply nickel thick amount to wound bed as instructed Iodoform packing strip 1/2 (in) Discharge Instruction: Lightly pack into tunneled area as instructed Secondary Dressing Zetuvit Plus Silicone Border Dressing 5x5 (in/in) Discharge Instruction: Apply silicone border over primary dressing as directed. Secured With Compression Wrap Compression Stockings Environmental education officer) Signed: 06/01/2022 3:52:08 PM By: [name] MD FACS Entered By: [name] on 06/01/2022 15:52:07 -------------------------------------------------------------------------------- Multi-Disciplinary Care Plan Details Patient Name: Date of Service: [name], [name] 06/01/2022 2:45 PM Medical Record Number: [id number] Patient Account Number: [id number] Date of Birth/Sex: Treating RN: 04/14/1971 (51 y.o. [name] Primary Care Holman Bonsignore: [name] Other Clinician: Referring Agron Swiney: Treating Chinita Schimpf/Extender: Shea Stakes in Treatment: [address] NAYVIE, LIPS ([id number]) [id number] Page 4 of 7 Multidisciplinary Care Plan reviewed with physician Active Inactive Wound/Skin Impairment Nursing Diagnoses: Impaired tissue integrity Goals: Patient/caregiver will verbalize understanding of skin care regimen Date Initiated: 04/24/2022 Target Resolution Date: 07/05/2022 Goal Status: Active Interventions: Assess ulceration(s) every visit Treatment Activities: Skin care regimen initiated : 04/24/2022 Notes: Electronic Signature(s) Signed: 06/01/2022 5:35:27 PM By: Baruch Gouty RN, BSN Entered By: Baruch Gouty on 06/01/2022  15:08:36 -------------------------------------------------------------------------------- Pain Assessment Details Patient Name: Date of Service: [name] Mattes. 06/01/2022 2:45 PM Medical Record Number: [id number] Patient Account Number: [id number] Date of Birth/Sex: Treating RN: 1970/11/27 (51 y.o. F) Primary Care Dontavion Noxon: [name] Other Clinician: Referring Mckaylie Vasey: Treating Madylyn Insco/Extender: [name], [name] Weeks in Treatment: 5 Active Problems Location of Pain Severity and Description of Pain Patient Has Paino No Site Locations Pain Management and Medication Current Pain Management: Electronic Signature(s) Signed: 06/01/2022 3:17:55 PM By: Worthy Rancher Entered By: Worthy Rancher on 06/01/2022 14:57:30 [name] ([id number]) [id number] Page 5 of 7 -------------------------------------------------------------------------------- Patient/Caregiver Education Details Patient Name: Date of Service: [name] Mattes 11/27/2023andnbsp2:45 PM Medical Record Number: [id number] Patient Account Number: [id number] Date of Birth/Gender: Treating RN: [birth year] (51 y.o. [name] Primary Care Physician: [name] Other Clinician: Referring Physician: Treating Physician/Extender: Shea Stakes in Treatment: 5 Education Assessment Education Provided To: Patient Education Topics Provided Malignant/Atypical Wounds: Methods: Explain/Verbal Responses: Reinforcements needed, State content correctly Wound/Skin Impairment: Methods: Explain/Verbal Responses: Reinforcements needed, State content correctly Electronic Signature(s) Signed:  06/01/2022 5:35:27 PM By: Baruch Gouty RN, BSN Entered By: Baruch Gouty on 06/01/2022 15:09:01 -------------------------------------------------------------------------------- Wound Assessment Details Patient Name: Date of Service: [name] Mattes.  06/01/2022 2:45 PM Medical Record Number: [id number] Patient Account Number: [id number] Date of Birth/Sex: Treating RN: 1970/10/24 (51 y.o. [name] Primary Care Kaylor Simenson: [name] Other Clinician: Referring Hernan Turnage: Treating Maddisen Vought/Extender: [name], [name] Weeks in Treatment: 5 Wound Status Wound Number: 1 Primary Etiology: Malignant Wound Wound Location: Left Breast Wound Status: Open Wounding Event: Surgical Injury Comorbid History: Lymphedema Date Acquired: 04/03/2022 Weeks Of Treatment: 5 Clustered Wound: No Photos [name], [name] ([id number]) [id number] Page 6 of 7 Wound Measurements Length: (cm) 2.8 Width: (cm) 4.4 Depth: (cm) 0.4 Area: (cm) 9.676 Volume: (cm) 3.87 % Reduction in Area: 27.4% % Reduction in Volume: 27.4% Epithelialization: None Tunneling: No Undermining: No Wound Description Classification: Full Thickness Without Exposed Support Structures Wound Margin: Distinct, outline attached Exudate Amount: Medium Exudate Type: Serosanguineous Exudate Color: red, brown Foul Odor After Cleansing: No Slough/Fibrino Yes Wound Bed Granulation Amount: Small (1-33%) Exposed Structure Granulation Quality: Red Fascia Exposed: No Necrotic Amount: Large (67-100%) Fat Layer (Subcutaneous Tissue) Exposed: Yes Necrotic Quality: Adherent Slough Tendon Exposed: No Muscle Exposed: No Joint Exposed: No Bone Exposed: No Periwound Skin Texture Texture Color No Abnormalities Noted: No No Abnormalities Noted: Yes Induration: Yes Temperature / Pain Temperature: No Abnormality Moisture No Abnormalities Noted: Yes Treatment Notes Wound #1 (Breast) Wound Laterality: Left Cleanser Normal Saline Discharge Instruction: Cleanse the wound with Normal Saline prior to applying a clean dressing using gauze sponges, not tissue or cotton balls. Soap and Water Discharge Instruction: May shower and wash wound with  dial antibacterial soap and water prior to dressing change. Byram Ancillary Kit - 15 Day Supply Discharge Instruction: Use supplies as instructed; Kit contains: (15) Saline Bullets; (15) 3x3 Gauze; 15 pr Gloves Peri-Wound Care Topical Primary Dressing Santyl Ointment Discharge Instruction: Apply nickel thick amount to wound bed as instructed Iodoform packing strip 1/2 (in) Discharge Instruction: Lightly pack into tunneled area as instructed Secondary Dressing Zetuvit Plus Silicone Border Dressing 5x5 (in/in) Discharge Instruction: Apply silicone border over primary dressing as directed. Secured With Compression ZORAH, BACKES ([id number]) [id number] Page 7 of 7 Compression Stockings Add-Ons Electronic Signature(s) Signed: 06/01/2022 5:35:27 PM By: Baruch Gouty RN, BSN Entered By: Baruch Gouty on 06/01/2022 16:48:22 -------------------------------------------------------------------------------- Vitals Details Patient Name: Date of Service: [name], [place] W. 06/01/2022 2:45 PM Medical Record Number: [id number] Patient Account Number: [id number] Date of Birth/Sex: Treating RN: Nov 04, 1970 (51 y.o. F) Primary Care Micael Barb: [name] Other Clinician: Referring Korey Arroyo: Treating Aarini Slee/Extender: [name], [name] Weeks in Treatment: 5 Vital Signs Time Taken: 02:50 Temperature (F): 97.9 Height (in): 63 Pulse (bpm): 80 Weight (lbs): 170 Respiratory Rate (breaths/min): 20 Body Mass Index (BMI): 30.1 Blood Pressure (mmHg): 130/93 Reference Range: 80 - 120 mg / dl Electronic Signature(s) Signed: 06/01/2022 3:17:55 PM By: Worthy Rancher Entered By: Worthy Rancher on 06/01/2022 14:57:22

## 2022-06-01 NOTE — Progress Notes (Signed)
TOSHI, ISHII ([id number]) [id number] Page 1 of 8 Visit Report for 06/01/2022 Chief Complaint Document Details Patient Name: Date of Service: [name] RDCandie Mile. 06/01/2022 2:45 PM Medical Record Number: [id number] Patient Account Number: [id number] Date of Birth/Sex: Treating RN: 1971/04/17 (51 y.o. F) Primary Care Provider: [name] Other Clinician: Referring Provider: Treating Provider/Extender: [name], [name] Weeks in Treatment: 5 Information Obtained from: Patient Chief Complaint 04/24/2022; patient is here for a surgical wound on her left lateral breast Electronic Signature(s) Signed: 06/01/2022 3:52:13 PM By: [name] MD FACS Entered By: [name] on 06/01/2022 15:52:13 -------------------------------------------------------------------------------- Debridement Details Patient Name: Date of Service: [name] Mattes. 06/01/2022 2:45 PM Medical Record Number: [id number] Patient Account Number: [id number] Date of Birth/Sex: Treating RN: January 21, 1971 (51 y.o. [name] Primary Care Provider: [name] Other Clinician: Referring Provider: Treating Provider/Extender: Shea Stakes in Treatment: 5 Debridement Performed for Assessment: Wound #1 Left Breast Performed By: Physician [name], MD Debridement Type: Debridement Level of Consciousness (Pre-procedure): Awake and Alert Pre-procedure Verification/Time Out Yes - 15:12 Taken: Start Time: 15:14 Pain Control: Lidocaine 4% T opical Solution T Area Debrided (L x W): otal 2.8 (cm) x 4.4 (cm) = 12.32 (cm) Tissue and other material debrided: Viable, Non-Viable, Fat, Slough, Subcutaneous, Slough Level: Skin/Subcutaneous Tissue Debridement Description: Excisional Instrument: Curette Bleeding: Minimum Hemostasis Achieved: Pressure Procedural Pain: 0 Post Procedural Pain: 1 Response to Treatment: Procedure was  tolerated well Level of Consciousness (Post- Awake and Alert procedure): Post Debridement Measurements of Total Wound Length: (cm) 2.8 Width: (cm) 4.4 Depth: (cm) 0.4 Volume: (cm) 3.87 Character of Wound/Ulcer Post Debridement: Improved Post Procedure Diagnosis Same as [name], [name] ([id number]) [id number] Page 2 of 8 Notes scribed by Baruch Gouty, RN for Dr. Celine Ahr Electronic Signature(s) Signed: 06/01/2022 4:56:20 PM By: [name] MD FACS Signed: 06/01/2022 5:35:27 PM By: Baruch Gouty RN, BSN Entered By: Baruch Gouty on 06/01/2022 15:22:19 -------------------------------------------------------------------------------- HPI Details Patient Name: Date of Service: [name], [name] 06/01/2022 2:45 PM Medical Record Number: [id number] Patient Account Number: [id number] Date of Birth/Sex: Treating RN: [birth year] (51 y.o. F) Primary Care Provider: [name] Other Clinician: Referring Provider: Treating Provider/Extender: [name], [name] Weeks in Treatment: 5 History of Present Illness HPI Description: ADMISSION 05/25/2022 This is a 51 year old woman who is found to have an abnormal mammogram of her left breast earlier this year showing a 13 mm group of calcifications in the upper quadrant of the left breast. 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 She underwent a left lumpectomy on 11/12/2021. The patient states the wound never really healed and was open. She underwent radiation therapy from 12/10/2021 through 01/19/22 28 fractions of 1.8GY. Essentially the wound would not heal. She was treated several times with antibiotics finally on 03/25/2022 she was taken to the OR by Dr. Barry Dienes for excision of the wound in a sinus. Operative cultures grew strep and Prevotella and a culture from the clinic Livedo MRSA. It was recommended  for 4 weeks of Augmentin and doxycycline by infectious disease. She was seen by wound care and she has been treating the wound with Medihoney ever since. 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  She was on meth methotrexate 20 mg once a week however this I think was put on hold because of the wound followed by Dr. Posey Pronto of rheumatology. She has asthma. Most recent breast ultrasound was on 9/7 She has a large nonhealing wound on the lateral aspect of her left breast 04/30/2022: Although I do not have a photograph to compare to last week, the RN report is that it is substantially cleaner. She still has ample slough and nonviable fat and subcutaneous tissue present. She only was able to get her Santyl on Monday so she has just had a couple of days of treatment. 05/08/2022: The wound dimensions are about the same, but the surface is substantially cleaner. There is just a little bit of slough accumulation on the surface. 05/18/2022: The wound is a little bit smaller, but not much. It is quite a bit cleaner, however. 05/25/2022: The wound surface continues to improve. Very little slough accumulation this week. No substantial change in the wound dimensions. 06/01/2022: The wound surface continues to have a layer of slough on it. There is a crack in the surface near the 12 o'clock position that when further explored, revealed a cavity and tunnel that is about 2 cm deep. No purulent drainage or concern for infection. Electronic Signature(s) Signed: 06/01/2022 3:53:10 PM By: [name] MD FACS Entered By: [name] on 06/01/2022 15:53:10 -------------------------------------------------------------------------------- Physical Exam Details Patient Name:  Date of Service: [name] Mattes. 06/01/2022 2:45 PM Medical Record Number: [id number] Patient Account Number: [id number] Date of Birth/Sex: Treating RN: 1970/12/03 (51 y.o. F) Primary Care Provider: [name] Other Clinician: Referring Provider: Treating Provider/Extender: [name], [name] Weeks in Treatment: [address] [name], [name] ([id number]) [id number] Page 3 of 8 Constitutional . . . . No acute distress. Respiratory Normal work of breathing on room air. Notes 06/01/2022: The wound surface continues to have a layer of slough on it. There is a crack in the surface near the 12 o'clock position that when further explored, revealed a cavity and tunnel that is about 2 cm deep. No purulent drainage or concern for infection. Electronic Signature(s) Signed: 06/01/2022 3:53:35 PM By: [name] MD FACS Entered By: [name] on 06/01/2022 15:53:34 -------------------------------------------------------------------------------- Physician Orders Details Patient Name: Date of Service: [name] Mattes. 06/01/2022 2:45 PM Medical Record Number: [id number] Patient Account Number: [id number] Date of Birth/Sex: Treating RN: 17-Jun-1971 (51 y.o. [name] Primary Care Provider: [name] Other Clinician: Referring Provider: Treating Provider/Extender: [name], [name] Weeks in Treatment: 5 Verbal / Phone Orders: No Diagnosis Coding ICD-10 Coding Code Description T81.31XD Disruption of external operation (surgical) wound, not elsewhere classified, subsequent encounter 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 S21.002D Unspecified open wound of left breast, subsequent encounter 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 Follow-up Appointments ppointment in 1 week. - Dr. Celine Ahr Room 3 Return A Anesthetic (In clinic) Topical Lidocaine 4% applied  to wound bed - used in Clinic Bathing/ Shower/ Hygiene Other Bathing/Shower/Hygiene Orders/Instructions: - Change dressing after bathing Edema Control - Lymphedema / SCD / Other Other Edema Control Orders/Instructions: - Keep doing the Lymphadema checks Wound Treatment Wound #1 - Breast Wound Laterality: Left Cleanser: Normal Saline (Generic) 1 x Per Day/30 Days Discharge Instructions: Cleanse the wound with Normal Saline prior to applying a clean dressing using gauze sponges, not tissue or cotton balls. Cleanser: Soap and Water 1 x Per Day/30 Days Discharge Instructions: May shower and  wash wound with dial antibacterial soap and water prior to dressing change. Cleanser: Byram Ancillary Kit - 15 Day Supply (Generic) 1 x Per Day/30 Days Discharge Instructions: Use supplies as instructed; Kit contains: (15) Saline Bullets; (15) 3x3 Gauze; 15 pr Gloves Prim Dressing: Santyl Ointment (Generic) 1 x Per Day/30 Days ary Discharge Instructions: Apply nickel thick amount to wound bed as instructed Prim Dressing: Iodoform packing strip 1/2 (in) 1 x Per Day/30 Days ary Discharge Instructions: Lightly pack into tunneled area as instructed Secondary Dressing: Zetuvit Plus Silicone Border Dressing 5x5 (in/in) (DME) (Dispense As Written) 1 x Per Day/30 Days Discharge Instructions: Apply silicone border over primary dressing as directed. [name], [name] ([id number]) [id number] Page 4 of 8 Electronic Signature(s) Signed: 06/01/2022 4:56:20 PM By: [name] MD FACS Entered By: [name] on 06/01/2022 15:54:09 -------------------------------------------------------------------------------- Problem List Details Patient Name: Date of Service: [name] Mattes. 06/01/2022 2:45 PM Medical Record Number: [id number] Patient Account Number: [id number] Date of Birth/Sex: Treating RN: 07-16-1970 (51 y.o. [name], Linda Primary Care Provider: [name] Other  Clinician: Referring Provider: Treating Provider/Extender: Shea Stakes in Treatment: 5 Active Problems ICD-10 Encounter Code Description Active Date MDM Diagnosis T81.31XD Disruption of external operation (surgical) wound, not elsewhere classified, 04/24/2022 No Yes subsequent encounter L59.8 Other specified disorders of the skin and subcutaneous tissue related to 04/24/2022 No Yes radiation S21.002D Unspecified open wound of left breast, subsequent encounter 04/24/2022 No Yes 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 04/24/2022 No Yes Inactive Problems Resolved Problems Electronic Signature(s) Signed: 06/01/2022 3:51:22 PM By: [name] MD FACS Entered By: [name] on 06/01/2022 15:51:22 -------------------------------------------------------------------------------- Progress Note Details Patient Name: Date of Service: [name] Mattes. 06/01/2022 2:45 PM Medical Record Number: [id number] Patient Account Number: [id number] Date of Birth/Sex: Treating RN: Jul 01, 1971 (51 y.o. F) Primary Care Provider: [name] Other Clinician: Referring Provider: Treating Provider/Extender: [name], [name] Weeks in Treatment: [address] [name], [name] ([id number]) [id number] Page 5 of 8 Chief Complaint Information obtained from Patient 04/24/2022; patient is here for a surgical wound on her left lateral breast History of Present Illness (HPI) ADMISSION 05/25/2022 This is a 51 year old woman who is found to have an abnormal mammogram of her left breast earlier this year showing a 13 mm group of calcifications in the upper quadrant of the left breast. 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 She underwent a left lumpectomy on 11/12/2021. The patient states the wound never really healed and was open. She  underwent radiation therapy from 12/10/2021 through 01/19/22 28 fractions of 1.8GY. Essentially the wound would not heal. She was treated several times with antibiotics finally on 03/25/2022 she was taken to the OR by Dr. Barry Dienes for excision of the wound in a sinus. Operative cultures grew strep and Prevotella and a culture from the clinic Livedo MRSA. It was recommended for 4 weeks of Augmentin and doxycycline by infectious disease. She was seen by wound care and she has been treating the wound with Medihoney ever since. 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 She was on meth methotrexate 20 mg once a week however this I think was put on hold because of the wound  followed by Dr. Posey Pronto of rheumatology. She has asthma. Most recent breast ultrasound was on 9/7 She has a large nonhealing wound on the lateral aspect of her left breast 04/30/2022: Although I do not have a photograph to compare to last week, the RN report is that it is substantially cleaner. She still has ample slough and nonviable fat and subcutaneous tissue present. She only was able to get her Santyl on Monday so she has just had a couple of days of treatment. 05/08/2022: The wound dimensions are about the same, but the surface is substantially cleaner. There is just a little bit of slough accumulation on the surface. 05/18/2022: The wound is a little bit smaller, but not much. It is quite a bit cleaner, however. 05/25/2022: The wound surface continues to improve. Very little slough accumulation this week. No substantial change in the wound dimensions. 06/01/2022: The wound surface continues to have a layer of slough on it. There is a crack in the surface near the 12 o'clock position that when  further explored, revealed a cavity and tunnel that is about 2 cm deep. No purulent drainage or concern for infection. Patient History Information obtained from Patient. Social History Never smoker, Marital Status - Married, Alcohol Use - Moderate, Drug Use - No History, Caffeine Use - Daily. Medical History Hematologic/Lymphatic Patient has history of Lymphedema - Left breast-gets Lymphadema tx Hospitalization/Surgery History - 03/25/22 Left Breast cyst excision; 11/12/21 Left Breast Lumpectomy with Radioactive seeds;Left breat Biopsy;Anterior. 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 12/08/21- 01/29/22- 37 rounds of Radiation Psychiatric General Anxiety Objective Constitutional No acute distress. Vitals Time Taken: 2:50 AM, Height: 63 in, Weight: 170 lbs, BMI: 30.1, Temperature: 97.9 F, Pulse: 80 bpm, Respiratory Rate: 20 breaths/min, Blood Pressure: 130/93 mmHg. Respiratory Normal work of breathing on room air. General Notes: 06/01/2022: The wound surface continues to have a layer of slough on it. There is a crack in the surface near the 12 o'clock position that when further explored, revealed a cavity and tunnel that is about 2 cm deep. No purulent drainage or concern for infection. Integumentary (Hair, Skin) Wound #1 status is Open. Original cause of wound was Surgical Injury. The date acquired was: 04/03/2022. The wound has been in treatment 5 weeks. The wound is located on the Left Breast. The wound measures 2.8cm length x 4.4cm width x 0.4cm depth; 9.676cm^2 area and 3.87cm^3 volume. There is Fat [name], [name] ([id number]) [id number] Page 6 of 8 Layer (Subcutaneous Tissue) exposed. There is no tunneling or undermining noted. There is a medium amount of serosanguineous  drainage noted. The wound margin is distinct with the outline attached to the wound base. There is small (1-33%) red granulation within the wound bed. There is a large (67-100%) amount of necrotic tissue within the wound bed including Adherent Slough. The periwound skin appearance had no abnormalities noted for moisture. The periwound skin appearance had no abnormalities noted for color. The periwound skin appearance exhibited: Induration. Periwound temperature was noted as No Abnormality. Assessment Active Problems ICD-10 Disruption of external operation (surgical) wound, not elsewhere classified, subsequent encounter Other specified disorders of the skin and subcutaneous tissue related to radiation Unspecified open wound of left breast, subsequent encounter Intraductal carcinoma in situ of left breast Procedures Wound #1 Pre-procedure diagnosis of Wound #1 is a Malignant Wound located on the Left Breast . There was a Excisional Skin/Subcutaneous  Tissue Debridement with a total area of 12.32 sq cm performed by [name], MD. With the following instrument(s): Curette to remove Viable and Non-Viable tissue/material. Material removed includes Fat, Subcutaneous Tissue, and Slough after achieving pain control using Lidocaine 4% T opical Solution. No specimens were taken. A time out was conducted at 15:12, prior to the start of the procedure. A Minimum amount of bleeding was controlled with Pressure. The procedure was tolerated well with a pain level of 0 throughout and a pain level of 1 following the procedure. Post Debridement Measurements: 2.8cm length x 4.4cm width x 0.4cm depth; 3.87cm^3 volume. Character of Wound/Ulcer Post Debridement is improved. Post procedure Diagnosis Wound #1: Same as Pre-Procedure General Notes: scribed by Baruch Gouty, RN for Dr. Celine Ahr. Plan Follow-up Appointments: Return Appointment in 1 week. - Dr. Celine Ahr Room 3 Anesthetic: (In clinic) Topical Lidocaine  4% applied to wound bed - used in Clinic Bathing/ Shower/ Hygiene: Other Bathing/Shower/Hygiene Orders/Instructions: - Change dressing after bathing Edema Control - Lymphedema / SCD / Other: Other Edema Control Orders/Instructions: - Keep doing the Lymphadema checks WOUND #1: - Breast Wound Laterality: Left Cleanser: Normal Saline (Generic) 1 x Per Day/30 Days Discharge Instructions: Cleanse the wound with Normal Saline prior to applying a clean dressing using gauze sponges, not tissue or cotton balls. Cleanser: Soap and Water 1 x Per Day/30 Days Discharge Instructions: May shower and wash wound with dial antibacterial soap and water prior to dressing change. Cleanser: Byram Ancillary Kit - 15 Day Supply (Generic) 1 x Per Day/30 Days Discharge Instructions: Use supplies as instructed; Kit contains: (15) Saline Bullets; (15) 3x3 Gauze; 15 pr Gloves Prim Dressing: Santyl Ointment (Generic) 1 x Per Day/30 Days ary Discharge Instructions: Apply nickel thick amount to wound bed as instructed Prim Dressing: Iodoform packing strip 1/2 (in) 1 x Per Day/30 Days ary Discharge Instructions: Lightly pack into tunneled area as instructed Secondary Dressing: Zetuvit Plus Silicone Border Dressing 5x5 (in/in) (DME) (Dispense As Written) 1 x Per Day/30 Days Discharge Instructions: Apply silicone border over primary dressing as directed. 06/01/2022: The wound surface continues to have a layer of slough on it. There is a crack in the surface near the 12 o'clock position that when further explored, revealed a cavity and tunnel that is about 2 cm deep. No purulent drainage or concern for infection. I used a curette to debride slough, nonviable fat, and nonviable subcutaneous tissue from the wound. We are going to pack the cavity with iodoform packing strips and continue to apply Santyl to the wound surface. We are going to run her insurance for a snap VAC. She will follow-up in 1 week. Electronic  Signature(s) Signed: 06/01/2022 4:56:20 PM By: [name] MD FACS Signed: 06/01/2022 5:35:27 PM By: Baruch Gouty RN, BSN Previous Signature: 06/01/2022 3:54:59 PM Version By: [name] MD FACS Entered By: Baruch Gouty on 06/01/2022 16:50:51 [name] ([id number]) [id number] Page 7 of 8 -------------------------------------------------------------------------------- HxROS Details Patient Name: Date of Service: [name], [name] 06/01/2022 2:45 PM Medical Record Number: [id number] Patient Account Number: [id number] Date of Birth/Sex: Treating RN: 06/08/1971 (51 y.o. F) Primary Care Provider: [name] Other Clinician: Referring Provider: Treating Provider/Extender: [name], [name] Weeks in Treatment: 5 Information Obtained From Patient Ear/Nose/Mouth/Throat Medical History: Past Medical History Notes: Left ear limited hearing Hematologic/Lymphatic Medical History: Positive for: Lymphedema - Left breast-gets Lymphadema tx Gastrointestinal Medical History: Past Medical History Notes: GERD Immunological Medical History: Past Medical History Notes: Wegener"s Granulomatosis Musculoskeletal Medical History:  Past Medical History Notes: "RA like arthritis" as per patient Oncologic Medical History: Past Medical History Notes: Left Breast. 12/08/21- 01/29/22- 37 rounds of Radiation Psychiatric Medical History: Past Medical History Notes: General Anxiety Immunizations Pneumococcal Vaccine: Received Pneumococcal Vaccination: Yes Received Pneumococcal Vaccination On or After 60th Birthday: No Implantable Devices Yes Hospitalization / Surgery History Type of Hospitalization/Surgery 03/25/22 Left Breast cyst excision; 11/12/21 Left Breast Lumpectomy with Radioactive seeds;Left breat Biopsy;Anterior Family and Social History Never smoker; Marital Status - Married; Alcohol Use: Moderate; Drug Use: No  History; Caffeine Use: Daily; Financial Concerns: No; Food, Clothing or Shelter Needs: No; Support System Lacking: No; Transportation Concerns: No [name], [name] ([id number]) [id number] Page 8 of 8 Electronic Signature(s) Signed: 06/01/2022 4:56:20 PM By: [name] MD FACS Entered By: [name] on 06/01/2022 15:53:15 -------------------------------------------------------------------------------- SuperBill Details Patient Name: Date of Service: [name], [name] 06/01/2022 Medical Record Number: [id number] Patient Account Number: [id number] Date of Birth/Sex: Treating RN: 1971/04/08 (51 y.o. F) Primary Care Provider: [name] Other Clinician: Referring Provider: Treating Provider/Extender: [name], [name] Weeks in Treatment: 5 Diagnosis Coding ICD-10 Codes Code Description T81.31XD Disruption of external operation (surgical) wound, not elsewhere classified, subsequent encounter 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 S21.002D Unspecified open wound of left breast, subsequent encounter 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 Facility Procedures : CPT4 Code: [id number] Description: [place] TISSUE 20 SQ CM/< ICD-10 Diagnosis Description S21.002D Unspecified open wound of left breast, subsequent encounter T81.31XD Disruption of external operation (surgical) wound, not elsewhere classified, subs Modifier: equent encounter Quantity: 1 Physician Procedures : CPT4 Code Description Modifier [id number] - WC PHYS LEVEL 4 - EST PT 25 ICD-10 Diagnosis Description S21.002D Unspecified open wound of left breast, subsequent encounter T81.31XD Disruption of external operation (surgical) wound, not elsewhere  classified, subsequent encounter 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 D05.12 Intraductal carcinoma in  situ of left breast Quantity: 1 : [id number] - WC PHYS SUBQ TISS 20 SQ CM ICD-10 Diagnosis Description S21.002D Unspecified open wound of left breast, subsequent encounter T81.31XD Disruption of external operation (surgical) wound, not elsewhere classified, subsequent encounter Quantity: 1 Electronic Signature(s) Signed: 06/01/2022 3:55:22 PM By: [name] MD FACS Entered By: [name] on 06/01/2022 15:55:22

## 2022-06-02 ENCOUNTER — Other Ambulatory Visit: Payer: Self-pay | Admitting: Oncology

## 2022-06-02 ENCOUNTER — Other Ambulatory Visit (HOSPITAL_COMMUNITY): Payer: Self-pay

## 2022-06-02 DIAGNOSIS — S21002A Unspecified open wound of left breast, initial encounter: Secondary | ICD-10-CM | POA: Diagnosis not present

## 2022-06-02 MED ORDER — LETROZOLE 2.5 MG PO TABS
2.5000 mg | ORAL_TABLET | Freq: Every day | ORAL | 3 refills | Status: DC
  Filled 2022-06-02: qty 30, 30d supply, fill #0
  Filled 2022-06-15 – 2022-07-03 (×2): qty 30, 30d supply, fill #1
  Filled 2022-08-15: qty 30, 30d supply, fill #2
  Filled 2022-09-22: qty 30, 30d supply, fill #3

## 2022-06-02 NOTE — Telephone Encounter (Signed)
Refill for Femara sent into pharmacy.

## 2022-06-08 ENCOUNTER — Encounter (HOSPITAL_BASED_OUTPATIENT_CLINIC_OR_DEPARTMENT_OTHER): Payer: 59 | Admitting: General Surgery

## 2022-06-09 ENCOUNTER — Ambulatory Visit: Payer: 59 | Admitting: Internal Medicine

## 2022-06-11 ENCOUNTER — Other Ambulatory Visit (HOSPITAL_COMMUNITY): Payer: Self-pay

## 2022-06-11 MED ORDER — METHOTREXATE SODIUM 2.5 MG PO TABS
20.0000 mg | ORAL_TABLET | ORAL | 1 refills | Status: DC
  Filled 2022-06-11: qty 96, 84d supply, fill #0
  Filled 2022-09-10: qty 96, 84d supply, fill #1

## 2022-06-15 ENCOUNTER — Ambulatory Visit: Payer: Commercial Managed Care - PPO | Attending: General Surgery

## 2022-06-15 ENCOUNTER — Other Ambulatory Visit (HOSPITAL_COMMUNITY): Payer: Self-pay

## 2022-06-15 ENCOUNTER — Other Ambulatory Visit: Payer: Self-pay

## 2022-06-15 ENCOUNTER — Encounter (HOSPITAL_BASED_OUTPATIENT_CLINIC_OR_DEPARTMENT_OTHER): Payer: 59 | Attending: General Surgery | Admitting: Internal Medicine

## 2022-06-15 VITALS — Wt 175.5 lb

## 2022-06-15 DIAGNOSIS — M313 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: Secondary | ICD-10-CM | POA: Diagnosis not present

## 2022-06-15 DIAGNOSIS — K219 Gastro-esophageal reflux disease without esophagitis: Secondary | ICD-10-CM | POA: Insufficient documentation

## 2022-06-15 DIAGNOSIS — Z923 Personal history of irradiation: Secondary | ICD-10-CM | POA: Diagnosis not present

## 2022-06-15 DIAGNOSIS — D0512 Intraductal carcinoma in situ of left breast: Secondary | ICD-10-CM | POA: Diagnosis not present

## 2022-06-15 DIAGNOSIS — J45909 Unspecified asthma, uncomplicated: Secondary | ICD-10-CM | POA: Diagnosis not present

## 2022-06-15 DIAGNOSIS — S21002A Unspecified open wound of left breast, initial encounter: Secondary | ICD-10-CM | POA: Diagnosis not present

## 2022-06-15 DIAGNOSIS — L98492 Non-pressure chronic ulcer of skin of other sites with fat layer exposed: Secondary | ICD-10-CM | POA: Diagnosis not present

## 2022-06-15 DIAGNOSIS — Z483 Aftercare following surgery for neoplasm: Secondary | ICD-10-CM | POA: Insufficient documentation

## 2022-06-15 DIAGNOSIS — T8131XD Disruption of external operation (surgical) wound, not elsewhere classified, subsequent encounter: Secondary | ICD-10-CM | POA: Insufficient documentation

## 2022-06-15 NOTE — Therapy (Signed)
OUTPATIENT PHYSICAL THERAPY SOZO SCREENING NOTE   Patient Name: [name] MRN: [id number] DOB:April 03, 1971, 51 y.o., female Today's Date: 06/15/2022  PCP: Mckinley Jewel, MD REFERRING PROVIDER: Stark Klein, MD   PT End of Session - 06/15/22 1614     Visit Number 12   # unchanged due to screen only   PT Start Time 61    PT Stop Time 8185    PT Time Calculation (min) 3 min    Activity Tolerance Patient tolerated treatment well    Behavior During Therapy [facility] for tasks assessed/performed             Past Medical History:  Diagnosis Date   Asthma    followed by pcp---  pt stated mild seasonal asthma   Cancer ([place]) 10/2021   left breast DCIS   Female cystocele    symptomatic   GAD (generalized anxiety disorder)    GERD (gastroesophageal reflux disease)    Hiatal hernia    History of 2019 novel coronavirus disease (COVID-19) 07/2020   per pt had mild symptoms that resolved   MDD (major depressive disorder)    Mixed incontinence urge and stress    Wears glasses    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[place])    [facility]---  dx 2015,  takes methotrexate wkly, pt stated she is stable   Past Surgical History:  Procedure Laterality Date   ANTERIOR AND POSTERIOR REPAIR N/A 01/14/2021   Procedure: ANTERIOR (CYSTOCELE) AND POSTERIOR REPAIR (RECTOCELE);  Surgeon: Cheri Fowler, MD;  Location: [facility];  Service: Gynecology;  Laterality: N/A;   AUGMENTATION MAMMAPLASTY Bilateral 2010   saline   BREAST BIOPSY Left 10/23/2021   Stereo Bx, X-clip, Ductal carcinoma in situ   BREAST CYST EXCISION Left 03/25/2022   Procedure: EXCISION OF CHRONIC LEFT BREAST WOUND;  Surgeon: Stark Klein, MD;  Location: [place];  Service: General;  Laterality: Left;   BREAST ENHANCEMENT SURGERY  2009   BREAST LUMPECTOMY     BREAST LUMPECTOMY WITH RADIOACTIVE SEED AND SENTINEL LYMPH NODE BIOPSY Left 11/12/2021    Procedure: LEFT BREAST LUMPECTOMY WITH RADIOACTIVE SEED AND SENTINEL LYMPH NODE BIOPSY;  Surgeon: Stark Klein, MD;  Location: [place];  Service: General;  Laterality: Left;   CARPAL TUNNEL RELEASE Right 2011   LAPAROSCOPIC SUPRACERVICAL HYSTERECTOMY  02/21/2010   @ [place] by Dr Willis Modena   NASAL SEPTUM SURGERY  01/2018   insertion button prosthesis for septal perforation   RHINOPLASTY  2005   nasal fracture   Patient Active Problem List   Diagnosis Date Noted   Abscess    Cellulitis of left breast 04/03/2022   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  09/13/2020   Cervix prolapsed into vagina 09/13/2020   Dysuria 09/13/2020   Genetic testing 09/10/2020   Anxiety 06/18/2020   Mild intermittent asthma without complication 63/14/9702   Bloody diarrhea 06/09/2016   Major depressive disorder, recurrent, severe without psychotic features ([place])    Severe recurrent major depression w/psychotic features, mood-congruent ([place]) 05/04/2014   Major depression 05/04/2014   Positive ANA (antinuclear antibody) 04/10/2014   Granulomatosis with polyangiitis with pulmonary involvement ([place]) 02/07/2014   Generalized anxiety disorder 02/07/2014   Insomnia 02/07/2014   Sensorineural hearing loss of right ear 12/06/2013   Dermatitis 12/05/2013   Nasal septal perforation 12/05/2013  Allergic rhinitis 10/12/2006   Asthma 10/12/2006   GERD 10/12/2006    REFERRING DIAG: left breast cancer at risk for lymphedema  THERAPY DIAG: Aftercare following surgery for neoplasm  PERTINENT HISTORY: Patient was diagnosed on 10/23/21 with left DCIS (grade to be determined later). It measures 1.59m and is located in the upper outer quadrant. It is ER/PR +, HER2 -. Underwent a L lumpectomy and SLNB (0/3) on 11/12/21, Radiation to be completed 01/29/2022   PRECAUTIONS: left UE  Lymphedema risk, None  SUBJECTIVE: Pt returns for her 3 month L-Dex screen.   PAIN:  Are you having pain? No  SOZO SCREENING: Patient was assessed today using the SOZO machine to determine the lymphedema index score. This was compared to her baseline score. It was determined that she is within the recommended range when compared to her baseline and no further action is needed at this time. She will continue SOZO screenings. These are done every 3 months for 2 years post operatively followed by every 6 months for 2 years, and then annually.   L-DEX FLOWSHEETS - 06/15/22 1600       L-DEX LYMPHEDEMA SCREENING   Measurement Type Unilateral    L-DEX MEASUREMENT EXTREMITY Upper Extremity    POSITION  Standing    DOMINANT SIDE Right    At Risk Side Left    BASELINE SCORE (UNILATERAL) 5.1    L-DEX SCORE (UNILATERAL) 6.3    VALUE CHANGE (UNILAT) 1.2              [name] PTA 06/15/2022, 4:16 PM

## 2022-06-16 ENCOUNTER — Other Ambulatory Visit (HOSPITAL_COMMUNITY): Payer: Self-pay

## 2022-06-16 MED ORDER — SANTYL 250 UNIT/GM EX OINT
TOPICAL_OINTMENT | Freq: Every day | CUTANEOUS | 5 refills | Status: DC
  Filled 2022-06-16: qty 30, 15d supply, fill #0
  Filled 2022-06-23: qty 30, 15d supply, fill #1
  Filled 2022-07-03: qty 30, 15d supply, fill #2

## 2022-06-16 NOTE — Progress Notes (Signed)
ELEONORE, SHIPPEE ([id number]) [id number] Page 1 of 6 Visit Report for 06/15/2022 Debridement Details Patient Name: Date of Service: [name], [name] 06/15/2022 10:45 A M Medical Record Number: [id number] Patient Account Number: [id number] Date of Birth/Sex: Treating RN: 1970-09-13 (51 y.o. F) Primary Care Provider: [name] Other Clinician: Referring Provider: Treating Provider/Extender: Donavan Burnet, Tobin Chad in Treatment: 7 Debridement Performed for Assessment: Wound #1 Left Breast Performed By: Physician Ricard Dillon., MD Debridement Type: Debridement Level of Consciousness (Pre-procedure): Awake and Alert Pre-procedure Verification/Time Out Yes - 11:42 Taken: Start Time: 11:42 Pain Control: Lidocaine 5% topical ointment T Area Debrided (L x W): otal 2.7 (cm) x 4.3 (cm) = 11.61 (cm) Tissue and other material debrided: Non-Viable, Eschar, Subcutaneous Level: Skin/Subcutaneous Tissue Debridement Description: Excisional Instrument: Curette Bleeding: Minimum Hemostasis Achieved: Pressure Response to Treatment: Procedure was tolerated well Level of Consciousness (Post- Awake and Alert procedure): Post Debridement Measurements of Total Wound Length: (cm) 2.7 Width: (cm) 4.3 Depth: (cm) 0.6 Volume: (cm) 5.471 Character of Wound/Ulcer Post Debridement: Improved Post Procedure Diagnosis Same as Pre-procedure Notes scribed for Dr. Celine Ahr by [name], RN Electronic Signature(s) Signed: 06/15/2022 5:01:06 PM By: Linton Ham MD Entered By: Linton Ham on 06/15/2022 12:21:19 -------------------------------------------------------------------------------- HPI Details Patient Name: Date of Service: [name], [name] 06/15/2022 10:45 A M Medical Record Number: [id number] Patient Account Number: [id number] Date of Birth/Sex: Treating RN: January 14, 1971 (51 y.o. F) Primary Care Provider: [name] Other  Clinician: Referring Provider: Treating Provider/Extender: Donavan Burnet, Tobin Chad in Treatment: 7 History of Present Illness HPI Description: ADMISSION [name], [name] ([id number]) [id number] Page 2 of 6 05/25/2022 This is a 51 year old woman who is found to have an abnormal mammogram of her left breast earlier this year showing a 13 mm group of calcifications in the upper quadrant of the left breast. 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 She underwent a left lumpectomy on 11/12/2021. The patient states the wound never really healed and was open. She underwent radiation therapy from 12/10/2021 through 01/19/22 28 fractions of 1.8GY. Essentially the wound would not heal. She was treated several times with antibiotics finally on 03/25/2022 she was taken to the OR by Dr. Barry Dienes for excision of the wound in a sinus. Operative cultures grew strep and Prevotella and a culture from the clinic Livedo MRSA. It was recommended for 4 weeks of Augmentin and doxycycline by infectious disease. She was seen by wound care and she has been treating the wound with Medihoney ever since. 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 She was on meth methotrexate 20 mg once a week however this I think was put on hold because of the wound followed by Dr. Posey Pronto of rheumatology. She has asthma. Most recent breast ultrasound was on 9/7 She has a large nonhealing wound on the lateral aspect of her left breast 04/30/2022: Although I do not have a photograph to compare to last week, the RN report is that it is substantially cleaner. She still has ample slough  and nonviable fat and subcutaneous tissue present. She only was able to get her Santyl on Monday so she has just had a couple of days of treatment. 05/08/2022: The wound dimensions  are about the same, but the surface is substantially cleaner. There is just a little bit of slough accumulation on the surface. 05/18/2022: The wound is a little bit smaller, but not much. It is quite a bit cleaner, however. 05/25/2022: The wound surface continues to improve. Very little slough accumulation this week. No substantial change in the wound dimensions. 06/01/2022: The wound surface continues to have a layer of slough on it. There is a crack in the surface near the 12 o'clock position that when further explored, revealed a cavity and tunnel that is about 2 cm deep. No purulent drainage or concern for infection. 12/11; left breast wound. The wound itself is somewhat smaller in size per our measurements. However she has a divot at 1:00 that measures about a 1.5 cm deep. We have been using iodoform packing and if it and Santyl to the rest of the wound This wound was initially had a left lumpectomy on 11/12/2021 she underwent 28 fractions of radiation. She potentially could benefit from hyperbaric oxygen Electronic Signature(s) Signed: 06/15/2022 5:01:06 PM By: Linton Ham MD Entered By: Linton Ham on 06/15/2022 12:24:09 -------------------------------------------------------------------------------- Physical Exam Details Patient Name: Date of Service: [name] 06/15/2022 10:45 A M Medical Record Number: [id number] Patient Account Number: [id number] Date of Birth/Sex: Treating RN: [birth year] (51 y.o. F) Primary Care Provider: [name] Other Clinician: Referring Provider: Treating Provider/Extender: Donavan Burnet, [name] Weeks in Treatment: 7 Constitutional Sitting or standing Blood Pressure is within target range for patient.. Pulse regular and within target range for  patient.Marland Kitchen Respirations regular, non-labored and within target range.. Temperature is normal and within the target range for the patient.Marland Kitchen Appears in no distress. Notes Wound exam; left upper quadrant of the left breast. About the size of a $0.50 piece at 1:00 there is a divot about 1.5 cm. Surface of the wound does not look too bad but a very gritty fibrinous debris on the surface requiring debridement with a #5 curette. Hemostasis with direct pressure. No evidence of surrounding infection Electronic Signature(s) Signed: 06/15/2022 5:01:06 PM By: Linton Ham MD Entered By: Linton Ham on 06/15/2022 12:28:40 -------------------------------------------------------------------------------- Physician Orders Details Patient Name: Date of Service: [name] 06/15/2022 10:45 A M Medical Record Number: [id number] Patient Account Number: [id number] Date of Birth/Sex: Treating RN: [birth year] (51 y.o. [address][name] [place], [place] ([id number]) [phone].pdf Page 3 of 6 Primary Care Provider: [name] Other Clinician: Referring Provider: Treating Provider/Extender: Donavan Burnet, Tobin Chad in Treatment: 7 Verbal / Phone Orders: No Diagnosis Coding Follow-up Appointments ppointment in 1 week. - Dr. Celine Ahr Room 3 Return A Anesthetic (In clinic) Topical Lidocaine 5% applied to wound bed Bathing/ Shower/ Hygiene Other Bathing/Shower/Hygiene Orders/Instructions: - Change dressing after bathing Edema Control - Lymphedema / SCD / Other Other Edema Control Orders/Instructions: - Keep doing the Lymphadema checks Wound Treatment Wound #1 - Breast Wound Laterality: Left Cleanser: Normal Saline (Generic) 1 x Per Day/30 Days Discharge Instructions: Cleanse the wound with Normal Saline prior to applying a clean dressing using gauze sponges, not tissue or cotton balls. Cleanser: Soap and Water 1 x Per Day/30 Days Discharge  Instructions: May shower and wash wound with dial antibacterial soap and water prior to dressing change. Cleanser: Byram Ancillary Kit - 15 Day Supply (Generic) 1 x Per Day/30 Days Discharge Instructions: Use supplies as instructed; Kit contains: (15) Saline Bullets; (15) 3x3 Gauze; 15 pr Gloves Prim Dressing: Iodoform packing strip 1/2 (in) 1 x Per Day/30 Days  ary Discharge Instructions: Lightly pack into tunneled area as instructed Prim Dressing: Santyl Ointment (Generic) 1 x Per Day/30 Days ary Discharge Instructions: Apply nickel thick amount to wound bed as instructed Secondary Dressing: Zetuvit Plus Silicone Border Dressing 5x5 (in/in) (Dispense As Written) 1 x Per Day/30 Days Discharge Instructions: Apply silicone border over primary dressing as directed. Patient Medications llergies: kiwi, Ativan, erythromycin base, Xanax, nitrofurantoin A Notifications Medication Indication Start End 06/15/2022 lidocaine DOSE topical 5 % ointment - ointment topical Electronic Signature(s) Signed: 06/15/2022 4:59:49 PM By: [name] Signed: 06/15/2022 5:01:06 PM By: Linton Ham MD Entered By: [name] on 06/15/2022 11:43:56 -------------------------------------------------------------------------------- Problem List Details Patient Name: Date of Service: [name] 06/15/2022 10:45 A M Medical Record Number: [id number] Patient Account Number: [id number] Date of Birth/Sex: Treating RN: 06-May-1971 (51 y.o. F) Primary Care Provider: [name] Other Clinician: Referring Provider: Treating Provider/Extender: Wendall Mola in Treatment: 7 Active Problems ICD-10 SRUTI, AYLLON ([id number]) [id number] Page 4 of 6 Encounter Code Description Active Date MDM Diagnosis T81.31XD Disruption of external operation (surgical) wound, not elsewhere classified, 04/24/2022 No Yes subsequent encounter L59.8 Other  specified disorders of the skin and subcutaneous tissue related to 04/24/2022 No Yes radiation S21.002D Unspecified open wound of left breast, subsequent encounter 04/24/2022 No Yes 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 04/24/2022 No Yes Inactive Problems Resolved Problems Electronic Signature(s) Signed: 06/15/2022 5:01:06 PM By: Linton Ham MD Entered By: Linton Ham on 06/15/2022 12:20:14 -------------------------------------------------------------------------------- Progress Note Details Patient Name: Date of Service: [name] [name] 06/15/2022 10:45 A M Medical Record Number: [id number] Patient Account Number: [id number] Date of Birth/Sex: Treating RN: May 05, 1971 (51 y.o. F) Primary Care Provider: [name] Other Clinician: Referring Provider: Treating Provider/Extender: Donavan Burnet, Tobin Chad in Treatment: 7 Subjective History of Present Illness (HPI) ADMISSION 05/25/2022 This is a 51 year old woman who is found to have an abnormal mammogram of her left breast earlier this year showing a 13 mm group of calcifications in the upper quadrant of the left breast. 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 She underwent a left lumpectomy on 11/12/2021. The patient states the wound never really healed and was open. She underwent radiation therapy from 12/10/2021 through 01/19/22 28 fractions of 1.8GY. Essentially the wound would not heal. She was treated several times with antibiotics finally on 03/25/2022 she was taken to the OR by Dr. Barry Dienes for excision of the wound in a sinus. Operative cultures grew strep and Prevotella and a culture from the clinic Livedo MRSA. It was recommended for 4 weeks of Augmentin and doxycycline by infectious disease. She was seen by wound care and she has been treating the wound with Medihoney ever since. 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 She was on meth methotrexate 20 mg once a week however this I think was put on hold because of the wound followed by Dr. Posey Pronto of rheumatology. She has asthma. Most recent breast ultrasound was on 9/7 She has a large nonhealing wound on the lateral aspect of her left breast 04/30/2022: Although I do not have a photograph to compare to last week, the RN report is that it is substantially cleaner. She still  has ample slough and nonviable fat and subcutaneous tissue present. She only was able to get her Santyl on Monday so she has just had a couple of days of treatment. 05/08/2022: The wound dimensions are about the same, but the surface is substantially cleaner. There is just a little bit of slough accumulation on the surface. 05/18/2022: The wound is a little bit smaller, but not much. It is quite a bit cleaner, however. 05/25/2022: The wound surface continues to improve. Very little slough accumulation this week. No substantial change in the wound dimensions. 06/01/2022: The wound surface continues to have a layer of slough on it. There is a crack in the surface near the 12 o'clock position that when further explored, revealed a cavity and tunnel that is about 2 cm deep. No purulent drainage or concern for infection. 12/11; left breast wound. The wound itself is somewhat smaller in size per our measurements. However she has a divot at 1:00 that measures about a 1.5 cm deep. We have been using iodoform packing and if it and Santyl to the rest of the wound [name], [name] ([id number]) [id number] Page 5 of 6 This wound was initially had a left lumpectomy on 11/12/2021 she underwent 28 fractions of radiation. She potentially  could benefit from hyperbaric oxygen Objective Constitutional Sitting or standing Blood Pressure is within target range for patient.. Pulse regular and within target range for patient.Marland Kitchen Respirations regular, non-labored and within target range.. Temperature is normal and within the target range for the patient.Marland Kitchen Appears in no distress. Vitals Time Taken: 11:08 AM, Height: 63 in, Weight: 170 lbs, BMI: 30.1, Temperature: 98.5 F, Pulse: 86 bpm, Respiratory Rate: 16 breaths/min, Blood Pressure: 124/82 mmHg. General Notes: Wound exam; left upper quadrant of the left breast. About the size of a $0.50 piece at 1:00 there is a divot about 1.5 cm. Surface of the wound does not look too bad but a very gritty fibrinous debris on the surface requiring debridement with a #5 curette. Hemostasis with direct pressure. No evidence of surrounding infection Integumentary (Hair, Skin) Wound #1 status is Open. Original cause of wound was Surgical Injury. The date acquired was: 04/03/2022. The wound has been in treatment 7 weeks. The wound is located on the Left Breast. The wound measures 2.7cm length x 4.3cm width x 0.6cm depth; 9.118cm^2 area and 5.471cm^3 volume. There is Fat Layer (Subcutaneous Tissue) exposed. There is no tunneling or undermining noted. There is a medium amount of serosanguineous drainage noted. The wound margin is distinct with the outline attached to the wound base. There is small (1-33%) red granulation within the wound bed. There is a large (67-100%) amount of necrotic tissue within the wound bed including Adherent Slough. The periwound skin appearance had no abnormalities noted for moisture. The periwound skin appearance had no abnormalities noted for color. The periwound skin appearance exhibited: Induration. Periwound temperature was noted as No Abnormality. Assessment Active Problems ICD-10 Disruption of external operation (surgical) wound, not elsewhere classified, subsequent  encounter Other specified disorders of the skin and subcutaneous tissue related to radiation Unspecified open wound of left breast, subsequent encounter Intraductal carcinoma in situ of left breast Procedures Wound #1 Pre-procedure diagnosis of Wound #1 is a Malignant Wound located on the Left Breast . There was a Excisional Skin/Subcutaneous Tissue Debridement with a total area of 11.61 sq cm performed by Ricard Dillon., MD. With the following instrument(s): Curette to remove Non-Viable tissue/material. Material removed includes Eschar and Subcutaneous Tissue and after achieving pain  control using Lidocaine 5% topical ointment. No specimens were taken. A time out was conducted at 11:42, prior to the start of the procedure. A Minimum amount of bleeding was controlled with Pressure. The procedure was tolerated well. Post Debridement Measurements: 2.7cm length x 4.3cm width x 0.6cm depth; 5.471cm^3 volume. Character of Wound/Ulcer Post Debridement is improved. Post procedure Diagnosis Wound #1: Same as Pre-Procedure General Notes: scribed for Dr. Celine Ahr by [name], RN. Plan Follow-up Appointments: Return Appointment in 1 week. - Dr. Celine Ahr Room 3 Anesthetic: (In clinic) Topical Lidocaine 5% applied to wound bed Bathing/ Shower/ Hygiene: Other Bathing/Shower/Hygiene Orders/Instructions: - Change dressing after bathing Edema Control - Lymphedema / SCD / Other: Other Edema Control Orders/Instructions: - Keep doing the Lymphadema checks The following medication(s) was prescribed: lidocaine topical 5 % ointment ointment topical was prescribed at facility WOUND #1: - Breast Wound Laterality: Left Cleanser: Normal Saline (Generic) 1 x Per Day/30 Days Discharge Instructions: Cleanse the wound with Normal Saline prior to applying a clean dressing using gauze sponges, not tissue or cotton balls. Cleanser: Soap and Water 1 x Per Day/30 Days Discharge Instructions: May shower and wash  wound with dial antibacterial soap and water prior to dressing change. Cleanser: Byram Ancillary Kit - 15 Day Supply (Generic) 1 x Per Day/30 Days Discharge Instructions: Use supplies as instructed; Kit contains: (15) Saline Bullets; (15) 3x3 Gauze; 15 pr Gloves Prim Dressing: Iodoform packing strip 1/2 (in) 1 x Per Day/30 Days ary [name], [name] ([id number]) [id number] Page 6 of 6 Discharge Instructions: Lightly pack into tunneled area as instructed Prim Dressing: Santyl Ointment (Generic) 1 x Per Day/30 Days ary Discharge Instructions: Apply nickel thick amount to wound bed as instructed Secondary Dressing: Zetuvit Plus Silicone Border Dressing 5x5 (in/in) (Dispense As Written) 1 x Per Day/30 Days Discharge Instructions: Apply silicone border over primary dressing as directed. 1. I continued with the iodoform to the divot and Santyl 2. She is going to require ongoing debridements 3. I think she would be a candidate for hyperbaric oxygen therapy as I understand things the 21-monthtimeframe post radiation treatment is no longer applicable. Her insurance is [employer] [place]ing to [place] January 1 as I understand things. 30 dives should suffice to see if there is any improvement in the condition of the granulation. Electronic Signature(s) Signed: 06/15/2022 5:01:06 PM By: RLinton HamMD Entered By: RLinton Hamon 06/15/2022 12:30:26 -------------------------------------------------------------------------------- SuperBill Details Patient Name: Date of Service: [name], [name] 06/15/2022 Medical Record Number: [id number] Account Number: [id number] of Birth/Sex: Treating RN: 6September 10, 1972(51y.o. F) Primary Care Provider: PEarly OsmondOther Clinician: Referring Provider: Treating Provider/Extender: RDonavan Burnet [name] Weeks in Treatment: 7 Diagnosis Coding ICD-10 Codes Code Description T81.31XD Disruption of external  operation (surgical) wound, not elsewhere classified, subsequent encounter 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 S21.002D Unspecified open wound of left breast, subsequent encounter 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 Facility Procedures : CPT4 Code: [id number]: 197026- DEB SUBQ TISSUE 20 SQ CM/< ICD-10 Diagnosis Description S21.002D Unspecified open wound of left breast, subsequent encounter Modifier: Quantity: 1 Physician Procedures : CPT4 Code Description Modifier [id number] PHYS SUBQ TISS 20 SQ CM ICD-10 Diagnosis Description S21.002D Unspecified open wound of left breast, subsequent encounter Quantity: 1 Electronic Signature(s) Signed: 06/15/2022 5:01:06 PM By: RLinton HamMD Entered By: RLinton Hamon 06/15/2022 12:30:38

## 2022-06-17 DIAGNOSIS — Z124 Encounter for screening for malignant neoplasm of cervix: Secondary | ICD-10-CM | POA: Diagnosis not present

## 2022-06-17 DIAGNOSIS — Z130 Encounter for screening for diseases of the blood and blood-forming organs and certain disorders involving the immune mechanism: Secondary | ICD-10-CM | POA: Diagnosis not present

## 2022-06-17 DIAGNOSIS — Z6831 Body mass index (BMI) 31.0-31.9, adult: Secondary | ICD-10-CM | POA: Diagnosis not present

## 2022-06-17 DIAGNOSIS — Z01419 Encounter for gynecological examination (general) (routine) without abnormal findings: Secondary | ICD-10-CM | POA: Diagnosis not present

## 2022-06-17 DIAGNOSIS — Z1151 Encounter for screening for human papillomavirus (HPV): Secondary | ICD-10-CM | POA: Diagnosis not present

## 2022-06-17 DIAGNOSIS — Z1389 Encounter for screening for other disorder: Secondary | ICD-10-CM | POA: Diagnosis not present

## 2022-06-17 NOTE — Progress Notes (Signed)
NAYRA, COURY ([id number]) [id number] Page 1 of 7 Visit Report for 06/15/2022 Arrival Information Details Patient Name: Date of Service: [name] 06/15/2022 10:45 A M Medical Record Number: [id number] Patient Account Number: [id number] Date of Birth/Sex: Treating RN: [birth year] (51 y.o. [name] Primary Care Hether Anselmo: [name] Other Clinician: Referring Julya Alioto: Treating Gerlene Glassburn/Extender: Wendall Mola in Treatment: 7 Visit Information History Since Last Visit Added or deleted any medications: No Patient Arrived: Ambulatory Any new allergies or adverse reactions: No Arrival Time: 11:07 Had a fall or experienced change in No Accompanied By: self activities of daily living that may affect Transfer Assistance: None risk of falls: Patient Identification Verified: Yes Signs or symptoms of abuse/neglect since last visito No Patient Requires Transmission-Based Precautions: No Hospitalized since last visit: No Patient Has Alerts: No Implantable device outside of the clinic excluding No cellular tissue based products placed in the center since last visit: Has Dressing in Place as Prescribed: Yes Pain Present Now: No Electronic Signature(s) Signed: 06/17/2022 2:56:09 PM By: Dellie Catholic RN Entered By: Dellie Catholic on 06/15/2022 11:11:44 -------------------------------------------------------------------------------- Encounter Discharge Information Details Patient Name: Date of Service: [name], [place] W. 06/15/2022 10:45 A M Medical Record Number: [id number] Patient Account Number: [id number] Date of Birth/Sex: Treating RN: [birth year] (51 y.o. [name] Kajuana Shareef: [name] Other Clinician: Referring Merel Santoli: Treating Akirah Storck/Extender: Wendall Mola in Treatment: 7 Encounter Discharge Information Items Post Procedure Vitals Discharge  Condition: Stable Temperature (F): 98.5 Ambulatory Status: Ambulatory Pulse (bpm): 86 Discharge Destination: Home Respiratory Rate (breaths/min): 16 Transportation: Private Auto Blood Pressure (mmHg): 124/82 Accompanied By: self Schedule Follow-up Appointment: Yes Clinical Summary of Care: Patient Declined Electronic Signature(s) Signed: 06/15/2022 4:59:49 PM By: [name] Entered By: [name] on 06/15/2022 11:45:15 [name] ([id number]) [id number] Page 2 of 7 -------------------------------------------------------------------------------- Lower Extremity Assessment Details Patient Name: Date of Service: [name] 06/15/2022 10:45 A M Medical Record Number: [id number] Patient Account Number: [id number] Date of Birth/Sex: Treating RN: 04-04-1971 (51 y.o. [name] Primary Care Jerra Huckeby: [name] Other Clinician: Referring Lavell Supple: Treating Jonnette Nuon/Extender: Donavan Burnet, Tobin Chad in Treatment: 7 Electronic Signature(s) Signed: 06/17/2022 2:56:09 PM By: Dellie Catholic RN Entered By: Dellie Catholic on 06/15/2022 11:12:36 -------------------------------------------------------------------------------- Multi Wound Chart Details Patient Name: Date of Service: [name], [name] 06/15/2022 10:45 A M Medical Record Number: [id number] Patient Account Number: [id number] Date of Birth/Sex: Treating RN: December 20, 1970 (51 y.o. F) Primary Care Loa Idler: [name] Other Clinician: Referring Paris Hohn: Treating Chiyoko Torrico/Extender: Donavan Burnet, [name] Weeks in Treatment: 7 Vital Signs Height(in): 63 Pulse(bpm): 86 Weight(lbs): 170 Blood Pressure(mmHg): 124/82 Body Mass Index(BMI): 30.1 Temperature(F): 98.5 Respiratory Rate(breaths/min): 16 [1:Photos: No Photos Left Breast Wound Location: Surgical Injury Wounding Event: Malignant Wound Primary Etiology: Lymphedema Comorbid  History: 04/03/2022 Date Acquired: 7 Weeks of Treatment: Open Wound Status: No Wound Recurrence: 2.7x4.3x0.6 Measurements L x W x D  (cm) 9.118 A (cm) : rea 5.471 Volume (cm) : 31.50% % Reduction in A rea: -2.70% % Reduction in Volume: Full Thickness Without Exposed Classification: Support Structures Medium Exudate A mount: Serosanguineous Exudate Type: red, brown Exudate Color:  Distinct, outline attached Wound Margin: Small (1-33%) Granulation A mount: Red Granulation Quality: Large (67-100%) Necrotic A mount: Fat Layer (Subcutaneous Tissue): Yes N/A Exposed Structures: Fascia: No Tendon: No Muscle: No Joint: No Bone: No None  Epithelialization: Debridement - Excisional Debridement: Pre-procedure Verification/Time Out 11:42 Taken:] [N/A:N/A N/A N/A  N/A N/A N/A N/A N/A N/A N/A N/A N/A N/A N/A N/A N/A N/A N/A N/A N/A N/A N/A N/A N/A N/A] Martucci, [name] ([id number]) [1:Lidocaine 5% topical ointment Pain Control: Necrotic/Eschar, Subcutaneous Tissue Debrided: Skin/Subcutaneous Tissue Level: 11.61 Debridement A (sq cm): rea Curette Instrument: Minimum Bleeding: Pressure Hemostasis A chieved: Procedure was tolerated  well Debridement Treatment Response: 2.7x4.3x0.6 Post Debridement Measurements L x W x D (cm) 5.471 Post Debridement Volume: (cm) Induration: Yes Periwound Skin Texture: No Abnormalities Noted Periwound Skin Moisture: No Abnormalities Noted Periwound  Skin Color: No Abnormality Temperature: Debridement Procedures Performed:] [N/A:N/A N/A N/A N/A N/A N/A N/A N/A N/A N/A N/A N/A N/A N/A N/A] Treatment Notes Wound #1 (Breast) Wound Laterality: Left Cleanser Normal Saline Discharge Instruction: Cleanse the wound with Normal Saline prior to applying a clean dressing using gauze sponges, not tissue or cotton balls. Soap and Water Discharge Instruction: May shower and wash wound with dial antibacterial soap and water prior to dressing change. Byram Ancillary Kit - 15 Day Supply Discharge  Instruction: Use supplies as instructed; Kit contains: (15) Saline Bullets; (15) 3x3 Gauze; 15 pr Gloves Peri-Wound Care Topical Primary Dressing Iodoform packing strip 1/2 (in) Discharge Instruction: Lightly pack into tunneled area as instructed Santyl Ointment Discharge Instruction: Apply nickel thick amount to wound bed as instructed Secondary Dressing Zetuvit Plus Silicone Border Dressing 5x5 (in/in) Discharge Instruction: Apply silicone border over primary dressing as directed. Secured With Compression Wrap Compression Stockings Environmental education officer) Signed: 06/15/2022 5:01:06 PM By: Linton Ham MD Entered By: Linton Ham on 06/15/2022 12:20:33 -------------------------------------------------------------------------------- Multi-Disciplinary Care Plan Details Patient Name: Date of Service: [name], [name] 06/15/2022 10:45 A M Medical Record Number: [id number] Patient Account Number: [id number] Date of Birth/Sex: Treating RN: 1971-04-24 (52 y.o. [name] Antwoine Zorn: [name] Other Clinician: Referring Anabelen Kaminsky: Treating Elma Limas/Extender: Wendall Mola in Treatment: 7 Multidisciplinary Care Plan reviewed with physician [address] TEHILA, SOKOLOW ([id number]) [id number] Page 4 of 7 Wound/Skin Impairment Nursing Diagnoses: Impaired tissue integrity Goals: Patient/caregiver will verbalize understanding of skin care regimen Date Initiated: 04/24/2022 Target Resolution Date: 07/05/2022 Goal Status: Active Interventions: Assess ulceration(s) every visit Treatment Activities: Skin care regimen initiated : 04/24/2022 Notes: Electronic Signature(s) Signed: 06/15/2022 4:59:49 PM By: [name] Entered By: [name] on 06/15/2022 11:44:01 -------------------------------------------------------------------------------- Pain Assessment Details Patient  Name: Date of Service: [name]. 06/15/2022 10:45 A M Medical Record Number: [id number] Patient Account Number: [id number] Date of Birth/Sex: Treating RN: [birth year] (51 y.o. [name] Primary Care Ryin Schillo: [name] Other Clinician: Referring Tanga Gloor: Treating Abie Cheek/Extender: Wendall Mola in Treatment: 7 Active Problems Location of Pain Severity and Description of Pain Patient Has Paino No Site Locations Pain Management and Medication Current Pain Management: Electronic Signature(s) Signed: 06/17/2022 2:56:09 PM By: Dellie Catholic RN Entered By: Dellie Catholic on 06/15/2022 11:12:27 [name] ([id number]) [id number] Page 5 of 7 -------------------------------------------------------------------------------- Patient/Caregiver Education Details Patient Name: Date of Service: [name] 12/11/2023andnbsp10:45 A M Medical Record Number: [id number] Patient Account Number: [id number] Date of Birth/Gender: Treating RN: 1970-11-23 (51 y.o. [name] Physician: [name] Other Clinician: Referring Physician: Treating Physician/Extender: Wendall Mola in Treatment: 7 Education Assessment Education Provided To: Patient Education Topics Provided Wound Debridement: Methods: Explain/Verbal Responses: Reinforcements needed, State content correctly Electronic Signature(s) Signed: 06/15/2022 4:59:49 PM By: [name] Entered By: [name] on 06/15/2022 11:44:16 -------------------------------------------------------------------------------- Wound Assessment Details Patient Name:  Date of Service: [name], [name] 06/15/2022 10:45 A M Medical Record Number: [id number] Patient Account Number: [id number] Date of Birth/Sex: Treating RN: 1971/03/31 (51 y.o. [name] Primary Care Alsha Meland: [name] Other  Clinician: Referring [place] Ndiaye: Treating Laural Eiland/Extender: Donavan Burnet, [name] Weeks in Treatment: 7 Wound Status Wound Number: 1 Primary Etiology: Malignant Wound Wound Location: Left Breast Wound Status: Open Wounding Event: Surgical Injury Comorbid History: Lymphedema Date Acquired: 04/03/2022 Weeks Of Treatment: 7 Clustered Wound: No Wound Measurements Length: (cm) 2.7 Width: (cm) 4.3 Depth: (cm) 0.6 Area: (cm) 9.118 Volume: (cm) 5.471 % Reduction in Area: 31.5% % Reduction in Volume: -2.7% Epithelialization: None Tunneling: No Undermining: No Wound Description Classification: Full Thickness Without Exposed Suppor Wound Margin: Distinct, outline attached Exudate Amount: Medium Exudate Type: Serosanguineous Exudate Color: red, brown t Structures Foul Odor After Cleansing: No Slough/Fibrino Yes Wound Bed Granulation Amount: Small (1-33%) Exposed Structure Granulation Quality: Red Fascia Exposed: No NICLE, CONNOLE ([id number]) [id number] Page 6 of 7 Necrotic Amount: Large (67-100%) Fat Layer (Subcutaneous Tissue) Exposed: Yes Necrotic Quality: Adherent Slough Tendon Exposed: No Muscle Exposed: No Joint Exposed: No Bone Exposed: No Periwound Skin Texture Texture Color No Abnormalities Noted: No No Abnormalities Noted: Yes Induration: Yes Temperature / Pain Temperature: No Abnormality Moisture No Abnormalities Noted: Yes Treatment Notes Wound #1 (Breast) Wound Laterality: Left Cleanser Normal Saline Discharge Instruction: Cleanse the wound with Normal Saline prior to applying a clean dressing using gauze sponges, not tissue or cotton balls. Soap and Water Discharge Instruction: May shower and wash wound with dial antibacterial soap and water prior to dressing change. Byram Ancillary Kit - 15 Day Supply Discharge Instruction: Use supplies as instructed; Kit contains: (15) Saline Bullets; (15) 3x3 Gauze; 15 pr  Gloves Peri-Wound Care Topical Primary Dressing Iodoform packing strip 1/2 (in) Discharge Instruction: Lightly pack into tunneled area as instructed Santyl Ointment Discharge Instruction: Apply nickel thick amount to wound bed as instructed Secondary Dressing Zetuvit Plus Silicone Border Dressing 5x5 (in/in) Discharge Instruction: Apply silicone border over primary dressing as directed. Secured With Compression Wrap Compression Stockings Environmental education officer) Signed: 06/17/2022 2:56:09 PM By: Dellie Catholic RN Entered By: Dellie Catholic on 06/15/2022 11:19:04 -------------------------------------------------------------------------------- Vitals Details Patient Name: Date of Service: [name], [place] W. 06/15/2022 10:45 A M Medical Record Number: [id number] Patient Account Number: [id number] Date of Birth/Sex: Treating RN: 03-22-1971 (51 y.o. [name] Primary Care Leighton Brickley: [name] Other Clinician: Referring Josph Norfleet: Treating [place]/Extender: Wendall Mola in Treatment: 7 Vital Signs Time Taken: 11:08 Temperature (F): 98.5 Height (in): 63 Pulse (bpm): 86 Weight (lbs): 170 Respiratory Rate (breaths/min): 16 Body Mass Index (BMI): 30.1 Blood Pressure (mmHg): 124/82 Reference Range: 80 - 120 mg / dl VENISA, FRAMPTON ([id number]) [id number] Page 7 of 7 Electronic Signature(s) Signed: 06/17/2022 2:56:09 PM By: Dellie Catholic RN Entered By: Dellie Catholic on 06/15/2022 11:29:40

## 2022-06-22 ENCOUNTER — Encounter (HOSPITAL_BASED_OUTPATIENT_CLINIC_OR_DEPARTMENT_OTHER): Payer: 59 | Admitting: Internal Medicine

## 2022-06-22 DIAGNOSIS — C50912 Malignant neoplasm of unspecified site of left female breast: Secondary | ICD-10-CM | POA: Diagnosis not present

## 2022-06-22 DIAGNOSIS — D0512 Intraductal carcinoma in situ of left breast: Secondary | ICD-10-CM | POA: Diagnosis not present

## 2022-06-22 DIAGNOSIS — Z923 Personal history of irradiation: Secondary | ICD-10-CM | POA: Diagnosis not present

## 2022-06-22 DIAGNOSIS — M313 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: Secondary | ICD-10-CM | POA: Diagnosis not present

## 2022-06-22 DIAGNOSIS — S21002A Unspecified open wound of left breast, initial encounter: Secondary | ICD-10-CM | POA: Diagnosis not present

## 2022-06-22 DIAGNOSIS — T8131XD Disruption of external operation (surgical) wound, not elsewhere classified, subsequent encounter: Secondary | ICD-10-CM | POA: Diagnosis not present

## 2022-06-22 DIAGNOSIS — J45909 Unspecified asthma, uncomplicated: Secondary | ICD-10-CM | POA: Diagnosis not present

## 2022-06-22 DIAGNOSIS — K219 Gastro-esophageal reflux disease without esophagitis: Secondary | ICD-10-CM | POA: Diagnosis not present

## 2022-06-23 ENCOUNTER — Ambulatory Visit (HOSPITAL_COMMUNITY)
Admission: RE | Admit: 2022-06-23 | Discharge: 2022-06-23 | Disposition: A | Discharge status: Home / Self Care | Payer: 59 | Source: Ambulatory Visit | Attending: Oncology | Admitting: Oncology

## 2022-06-23 ENCOUNTER — Other Ambulatory Visit (HOSPITAL_COMMUNITY): Payer: Self-pay

## 2022-06-23 DIAGNOSIS — Z13820 Encounter for screening for osteoporosis: Secondary | ICD-10-CM | POA: Insufficient documentation

## 2022-06-23 DIAGNOSIS — Z780 Asymptomatic menopausal state: Secondary | ICD-10-CM | POA: Diagnosis not present

## 2022-06-23 DIAGNOSIS — S21002A Unspecified open wound of left breast, initial encounter: Secondary | ICD-10-CM | POA: Diagnosis not present

## 2022-06-23 NOTE — Progress Notes (Signed)
SHAMS, FILL ([id number]) [id number] Page 1 of 7 Visit Report for 06/22/2022 Arrival Information Details Patient Name: Date of Service: [name], [name] 06/22/2022 1:30 PM Medical Record Number: [id number] Patient Account Number: [id number] Date of Birth/Sex: Treating RN: [birth year] (51 y.o. [name] Primary Care [name]: [name] Other Clinician: Referring [name]: Treating [name]: [name] in [name]: 8 Visit Information History Since Last Visit Added or deleted any medications: No Patient Arrived: Ambulatory Any new allergies or adverse reactions: No Arrival Time: 13:26 Had a fall or experienced change in No Accompanied By: self activities of daily living that may affect Transfer Assistance: None risk of falls: Patient Identification Verified: Yes Signs or symptoms of abuse/neglect since last visito No Secondary Verification Process Completed: Yes Hospitalized since last visit: No Patient Requires Transmission-Based Precautions: No Implantable device outside of the clinic excluding No Patient Has Alerts: No cellular tissue based products placed in the center since last visit: Has Dressing in Place as Prescribed: Yes Pain Present Now: No Electronic Signature(s) Signed: 06/22/2022 5:14:26 PM By: [name] Entered By: [name] on 06/22/2022 13:26:59 -------------------------------------------------------------------------------- Encounter Discharge Information Details Patient Name: Date of Service: [name], [name] Bushy NIE W. 06/22/2022 1:30 PM Medical Record Number: [id number] Patient Account Number: [id number] Date of Birth/Sex: Treating RN: 11-10-1970 (51 y.o. [name] Primary Care [name]: [name] Other Clinician: Referring [name]: Treating [name]/Extender: [name] in [name]: 8 Encounter Discharge  Information Items Discharge Condition: Stable Ambulatory Status: Ambulatory Discharge Destination: Home Transportation: Private Auto Accompanied By: self Schedule Follow-up Appointment: Yes Clinical Summary of Care: Patient Declined Electronic Signature(s) Signed: 06/22/2022 5:14:26 PM By: [name] Entered By: [name] on 06/22/2022 13:44:31 [name], [name] ([id number]) [id number] Page 2 of 7 -------------------------------------------------------------------------------- Lower Extremity Assessment Details Patient Name: Date of Service: [name] [name] 06/22/2022 1:30 PM Medical Record Number: [id number] Patient Account Number: [id number] Date of Birth/Sex: Treating RN: 23-Feb-1971 (52 y.o. [name] Primary Care Dedee Liss: [name] Other Clinician: Referring [name]: Treating [name]/Extender: [name] in [name]: 8 Electronic Signature(s) Signed: 06/22/2022 5:14:26 PM By: [name] By: [name] on 06/22/2022 13:27:19 -------------------------------------------------------------------------------- Multi Wound Chart Details Patient Name: Date of Service: [name], [name] 06/22/2022 1:30 PM Medical Record Number: [id number] Patient Account Number: [id number] Date of Birth/Sex: Treating RN: [birth year] (51 y.o. F) Primary Care [name]: [name] Other Clinician: Referring Viyan Rosamond: Treating [name]/Extender: [name] in [name]: 8 Vital Signs Height(in): 63 Pulse(bpm): 97 Weight(lbs): 170 Blood Pressure(mmHg): 124/82 Body Mass Index(BMI): 30.1 Temperature(F): 98.5 Respiratory Rate(breaths/min): 18 [1:Photos:] [N/A:N/A] Left Breast N/A N/A Wound Location: Surgical Injury N/A N/A Wounding Event: Malignant Wound N/A N/A Primary Etiology: Lymphedema N/A N/A Comorbid History: 04/03/2022 N/A N/A Date  Acquired: 8 N/A N/A [name] of [name]: Open N/A N/A Wound Status: No N/A N/A Wound Recurrence: 2.5x4x0.1 N/A N/A Measurements L x W x D (cm) 7.854 N/A N/A A (cm) : rea 0.785 N/A N/A Volume (cm) : 41.00% N/A N/A % Reduction in A rea: 85.30% N/A N/A % Reduction in Volume: 1 Position 1 (o'clock): 1 Maximum Distance 1 (cm): Yes N/A N/A Tunneling: Full Thickness Without Exposed N/A N/A Classification: Support Structures Medium N/A N/A Exudate Amount: Serosanguineous N/A N/A Exudate Type: red, brown N/A N/A Exudate Color: Distinct, outline attached N/A N/A Wound MarginSHAREENA, [name] ([id number]) [id number] Page 3 of 7 Small (1-33%) N/A N/A Granulation Amount: Red N/A N/A Granulation  Quality: Large (67-100%) N/A N/A Necrotic Amount: Fat Layer (Subcutaneous Tissue): Yes N/A N/A Exposed Structures: Fascia: No Tendon: No Muscle: No Joint: No Bone: No Small (1-33%) N/A N/A Epithelialization: Debridement - Excisional N/A N/A Debridement: Pre-procedure Verification/Time Out 13:41 N/A N/A Taken: Lidocaine 4% Topical Solution N/A N/A Pain Control: Subcutaneous, Slough N/A N/A Tissue Debrided: Skin/Subcutaneous Tissue N/A N/A Level: 10 N/A N/A Debridement A (sq cm): rea Curette N/A N/A Instrument: Minimum N/A N/A Bleeding: Pressure N/A N/A Hemostasis A chieved: Procedure was tolerated well N/A N/A Debridement [name] Response: 2.5x4x0.1 N/A N/A Post Debridement Measurements L x W x D (cm) 0.785 N/A N/A Post Debridement Volume: (cm) Induration: Yes N/A N/A Periwound Skin Texture: No Abnormalities Noted N/A N/A Periwound Skin Moisture: No Abnormalities Noted N/A N/A Periwound Skin Color: No Abnormality N/A N/A Temperature: Debridement N/A N/A Procedures Performed: [name] Notes Wound #1 (Breast) Wound Laterality: Left Cleanser Normal Saline Discharge Instruction: Cleanse the wound with Normal Saline prior  to applying a clean dressing using gauze sponges, not tissue or cotton balls. Soap and Water Discharge Instruction: May shower and wash wound with dial antibacterial soap and water prior to dressing change. Byram Ancillary Kit - 15 Day Supply Discharge Instruction: Use supplies as instructed; Kit contains: (15) Saline Bullets; (15) 3x3 Gauze; 15 pr Gloves Peri-Wound Care Topical Primary Dressing Iodoform packing strip 1/2 (in) Discharge Instruction: Lightly pack into tunneled area as instructed Santyl Ointment Discharge Instruction: Apply nickel thick amount to wound bed as instructed Secondary Dressing Zetuvit Plus Silicone Border Dressing 5x5 (in/in) Discharge Instruction: Apply silicone border over primary dressing as directed. Secured With Compression Wrap Compression Stockings Environmental education officer) Signed: 06/22/2022 5:06:55 PM By: Linton Ham MD Entered By: Linton Ham on 06/22/2022 14:37:33 Chaves Details -------------------------------------------------------------------------------- [name] ([id number]) [id number] Page 4 of 7 Patient Name: Date of Service: [name], [name] 06/22/2022 1:30 PM Medical Record Number: [id number] Patient Account Number: [id number] Date of Birth/Sex: Treating RN: 1970-07-19 (51 y.o. [name] Primary Care Reatha Sur: [name] Other Clinician: Referring Myliyah Rebuck: Treating Eldar Robitaille/Extender: [name] in [name]: [place] reviewed with physician Active Inactive Wound/Skin Impairment Nursing Diagnoses: Impaired tissue integrity Goals: Patient/caregiver will verbalize understanding of skin care regimen Date Initiated: 04/24/2022 Target Resolution Date: 07/05/2022 Goal Status: Active Interventions: Assess ulceration(s) every visit [name] Activities: Skin care regimen initiated :  04/24/2022 Notes: Electronic Signature(s) Signed: 06/22/2022 5:14:26 PM By: [name] Entered By: [name] on 06/22/2022 13:28:05 -------------------------------------------------------------------------------- Pain Assessment Details Patient Name: Date of Service: [name] [name] 06/22/2022 1:30 PM Medical Record Number: [id number] Patient Account Number: [id number] Date of Birth/Sex: Treating RN: Jan 15, 1971 (51 y.o. [name] Primary Care Kaisy Severino: [name] Other Clinician: Referring Warrick Llera: Treating Sana Tessmer/Extender: [name] in [name]: 8 Active Problems Location of Pain Severity and Description of Pain Patient Has Paino No Site Locations Rate the pain. Current Pain Level: 0 Pain Management and Medication [name], [name] ([id number]) [id number] Page 5 of 7 Current Pain Management: Electronic Signature(s) Signed: 06/22/2022 5:14:26 PM By: [name] By: [name] on 06/22/2022 13:27:17 -------------------------------------------------------------------------------- Patient/Caregiver Education Details Patient Name: Date of Service: [name] 12/18/2023andnbsp1:30 PM Medical Record Number: [id number] Patient Account Number: [id number] Date of Birth/Gender: Treating RN: 1970-11-12 (51 y.o. [name] Primary Care Physician: [name] Other Clinician: Referring Physician: Treating Physician/Extender: [name] in [name]: 8 Education Assessment Education Provided To: Patient Education  Topics Provided Wound Debridement: Methods: Explain/Verbal Responses: Reinforcements needed, State content correctly Electronic Signature(s) Signed: 06/22/2022 5:14:26 PM By: [name] Entered By: [name] on 06/22/2022  13:28:20 -------------------------------------------------------------------------------- Wound Assessment Details Patient Name: Date of Service: [name], [name] 06/22/2022 1:30 PM Medical Record Number: [id number] Patient Account Number: [id number] Date of Birth/Sex: Treating RN: 1970-10-24 (51 y.o. [name] Primary Care Tayja Manzer: [name] Other Clinician: Referring Ellyssa Zagal: Treating Avyan Livesay/Extender: [name] in [name]: 8 Wound Status Wound Number: 1 Primary Etiology: Malignant Wound Wound Location: Left Breast Wound Status: Open Wounding Event: Surgical Injury Comorbid History: Lymphedema Date Acquired: 04/03/2022 [name] Of [name]: 8 Clustered Wound: No Photos [name], [name] ([id number]) [id number] Page 6 of 7 Wound Measurements Length: (cm) 2.5 Width: (cm) 4 Depth: (cm) 0.1 Area: (cm) 7.854 Volume: (cm) 0.785 % Reduction in Area: 41% % Reduction in Volume: 85.3% Epithelialization: Small (1-33%) Tunneling: Yes Position (o'clock): 1 Maximum Distance: (cm) 1 Undermining: No Wound Description Classification: Full Thickness Without Exposed Support Structures Wound Margin: Distinct, outline attached Exudate Amount: Medium Exudate Type: Serosanguineous Exudate Color: red, brown Foul Odor After Cleansing: No Slough/Fibrino Yes Wound Bed Granulation Amount: Small (1-33%) Exposed Structure Granulation Quality: Red Fascia Exposed: No Necrotic Amount: Large (67-100%) Fat Layer (Subcutaneous Tissue) Exposed: Yes Necrotic Quality: Adherent Slough Tendon Exposed: No Muscle Exposed: No Joint Exposed: No Bone Exposed: No Periwound Skin Texture Texture Color No Abnormalities Noted: No No Abnormalities Noted: Yes Induration: Yes Temperature / Pain Temperature: No Abnormality Moisture No Abnormalities Noted: Yes [name] Notes Wound #1 (Breast) Wound Laterality:  Left Cleanser Normal Saline Discharge Instruction: Cleanse the wound with Normal Saline prior to applying a clean dressing using gauze sponges, not tissue or cotton balls. Soap and Water Discharge Instruction: May shower and wash wound with dial antibacterial soap and water prior to dressing change. Byram Ancillary Kit - 15 Day Supply Discharge Instruction: Use supplies as instructed; Kit contains: (15) Saline Bullets; (15) 3x3 Gauze; 15 pr Gloves Peri-Wound Care Topical Primary Dressing Iodoform packing strip 1/2 (in) Discharge Instruction: Lightly pack into tunneled area as instructed Santyl Ointment Discharge Instruction: Apply nickel thick amount to wound bed as instructed Secondary Dressing Zetuvit Plus Silicone Border Dressing 5x5 (in/in) Discharge Instruction: Apply silicone border over primary dressing as directed. Secured With [name], [name] ([id number]) [id number] Page 7 of 7 Compression Wrap Compression Stockings Add-Ons Electronic Signature(s) Signed: 06/22/2022 5:14:26 PM By: [name] Entered By: [name] on 06/22/2022 13:36:38 -------------------------------------------------------------------------------- Vitals Details Patient Name: Date of Service: [name], Duncan W. 06/22/2022 1:30 PM Medical Record Number: [id number] Patient Account Number: [id number] Date of Birth/Sex: Treating RN: 1971-05-19 (51 y.o. [name] Primary Care Amandine Covino: [name] Other Clinician: Referring Jensyn Shave: Treating Weylin Plagge/Extender: [name] in [name]: 8 Vital Signs Time Taken: 13:27 Temperature (F): 98.5 Height (in): 63 Pulse (bpm): 97 Weight (lbs): 170 Respiratory Rate (breaths/min): 18 Body Mass Index (BMI): 30.1 Blood Pressure (mmHg): 124/82 Reference Range: 80 - 120 mg / dl Electronic Signature(s) Signed: 06/22/2022 5:14:26 PM By: [name] Entered By:  [name] on 06/22/2022 13:27:12

## 2022-06-23 NOTE — Progress Notes (Signed)
ROMAYNE, TICAS ([id number]) [id number] Page 1 of 6 Visit Report for 06/22/2022 Debridement Details Patient Name: Date of Service: [name], [name] 06/22/2022 1:30 PM Medical Record Number: [id number] Patient Account Number: [id number] Date of Birth/Sex: Treating RN: [birth year] (51 y.o. F) Primary Care Provider: [name] Other Clinician: Referring Provider: Treating Provider/Extender: Donavan Burnet, Tobin Chad in Treatment: 8 Debridement Performed for Assessment: Wound #1 Left Breast Performed By: Physician Ricard Dillon., MD Debridement Type: Debridement Level of Consciousness (Pre-procedure): Awake and Alert Pre-procedure Verification/Time Out Yes - 13:41 Taken: Start Time: 13:41 Pain Control: Lidocaine 4% T opical Solution T Area Debrided (L x W): otal 2.5 (cm) x 4 (cm) = 10 (cm) Tissue and other material debrided: Non-Viable, Slough, Subcutaneous, Slough Level: Skin/Subcutaneous Tissue Debridement Description: Excisional Instrument: Curette Bleeding: Minimum Hemostasis Achieved: Pressure Response to Treatment: Procedure was tolerated well Level of Consciousness (Post- Awake and Alert procedure): Post Debridement Measurements of Total Wound Length: (cm) 2.5 Width: (cm) 4 Depth: (cm) 0.1 Volume: (cm) 0.785 Character of Wound/Ulcer Post Debridement: Improved Post Procedure Diagnosis Same as Pre-procedure Notes scribed for Dr. Dellia Nims by [name], RN Electronic Signature(s) Signed: 06/22/2022 5:06:55 PM By: Linton Ham MD Entered By: Linton Ham on 06/22/2022 14:37:43 -------------------------------------------------------------------------------- HPI Details Patient Name: Date of Service: [name], [name] 06/22/2022 1:30 PM Medical Record Number: [id number] Patient Account Number: [id number] Date of Birth/Sex: Treating RN: Dec 10, 1970 (51 y.o. F) Primary Care Provider: [name] Other  Clinician: Referring Provider: Treating Provider/Extender: Donavan Burnet, Tobin Chad in Treatment: 8 History of Present Illness HPI Description: ADMISSION [name], [name] ([id number]) [id number] Page 2 of 6 05/25/2022 This is a 51 year old woman who is found to have an abnormal mammogram of her left breast earlier this year showing a 13 mm group of calcifications in the upper quadrant of the left breast. 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 She underwent a left lumpectomy on 11/12/2021. The patient states the wound never really healed and was open. She underwent radiation therapy from 12/10/2021 through 01/19/22 28 fractions of 1.8GY. Essentially the wound would not heal. She was treated several times with antibiotics finally on 03/25/2022 she was taken to the OR by Dr. Barry Dienes for excision of the wound in a sinus. Operative cultures grew strep and Prevotella and a culture from the clinic [place] MRSA. It was recommended for 4 weeks of Augmentin and doxycycline by infectious disease. She was seen by wound care and she has been treating the wound with Medihoney ever since. 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 She was on meth methotrexate 20 mg once a week however this I think was put on hold because of the wound followed by Dr. Posey Pronto of rheumatology. She has asthma. Most recent breast ultrasound was on 9/7 She has a large nonhealing wound on the lateral aspect of her left breast 04/30/2022: Although I do not have a photograph to compare to last week, the RN report is that it is substantially cleaner. She still has ample slough  and nonviable fat and subcutaneous tissue present. She only was able to get her Santyl on Monday so she has just had a couple of days of treatment. 05/08/2022: The wound dimensions  are about the same, but the surface is substantially cleaner. There is just a little bit of slough accumulation on the surface. 05/18/2022: The wound is a little bit smaller, but not much. It is quite a bit cleaner, however. 05/25/2022: The wound surface continues to improve. Very little slough accumulation this week. No substantial change in the wound dimensions. 06/01/2022: The wound surface continues to have a layer of slough on it. There is a crack in the surface near the 12 o'clock position that when further explored, revealed a cavity and tunnel that is about 2 cm deep. No purulent drainage or concern for infection. 12/11; left breast wound. The wound itself is somewhat smaller in size per our measurements. However she has a divot at 1:00 that measures about a 1.5 cm deep. We have been using iodoform packing and if it and Santyl to the rest of the wound This wound was initially had a left lumpectomy on 11/12/2021 she underwent 28 fractions of radiation. She potentially could benefit from hyperbaric oxygen 12/18; left breast wound. Better looking wound surface and improvement in measurements of the small tunnel. We have been using Santyl and iodoform. 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 06/22/2022 1:30 PM Medical Record Number: [id number] Patient Account Number: [id number] Date of Birth/Sex: Treating RN: 1970-09-11 (51 y.o. F) Primary  Care Provider: [name] Other Clinician: Referring Provider: Treating Provider/Extender: Donavan Burnet, [name] Weeks in Treatment: 8 Notes Wound exam; the upper quadrant of the left breast. This undermining area at 1:00 is coming nicely. 1.5 cm last week can be much more than 0.5 cm this week. I used a #5 curette to remove gritty surface slough and subcutaneous tissue hemostasis with direct pressure. There is no evidence of surrounding infection and no tenderness Electronic Signature(s) Signed: 06/22/2022 5:06:55 PM By: Linton Ham MD Entered By: Linton Ham on 06/22/2022 14:44:24 -------------------------------------------------------------------------------- Physician Orders Details Patient Name: Date of Service: [name], Knox W. 06/22/2022 1:30 PM Medical Record Number: [id number] Patient Account Number: [id number] Date of Birth/Sex: Treating RN: [birth year] (51 y.o. [name] Provider: [name] Other Clinician: JENEAL, [name] ([id number]) [id number] Page 3 of 6 Referring Provider: Treating Provider/Extender: Donavan Burnet, [name] Weeks in Treatment: 8 Verbal / Phone Orders: No Diagnosis Coding Follow-up Appointments ppointment in 1 week. - Dr. Celine Ahr Room 3 Return A Anesthetic (In clinic) Topical Lidocaine 4% applied to wound bed Bathing/ Shower/ Hygiene Other Bathing/Shower/Hygiene Orders/Instructions: - Change dressing after bathing Edema Control - Lymphedema / SCD / Other Other Edema Control Orders/Instructions: - Keep doing the Lymphadema checks Wound Treatment Wound #1 - Breast Wound Laterality: Left Cleanser: Normal Saline (Generic) 1 x Per Day/30 Days Discharge Instructions: Cleanse the wound with Normal Saline prior to applying a clean dressing using gauze sponges, not tissue or cotton balls. Cleanser: Soap and Water 1 x Per Day/30 Days Discharge Instructions: May  shower and wash wound with dial antibacterial soap and water prior to dressing change. Cleanser: Byram Ancillary Kit - 15 Day Supply (Generic) 1 x Per Day/30 Days Discharge Instructions: Use supplies as instructed; Kit contains: (15) Saline Bullets; (15) 3x3 Gauze; 15 pr Gloves Prim Dressing: Iodoform packing strip 1/2 (in) 1 x Per  Day/30 Days ary Discharge Instructions: Lightly pack into tunneled area as instructed Prim Dressing: Santyl Ointment (Generic) 1 x Per Day/30 Days ary Discharge Instructions: Apply nickel thick amount to wound bed as instructed Secondary Dressing: Zetuvit Plus Silicone Border Dressing 5x5 (in/in) (Dispense As Written) 1 x Per Day/30 Days Discharge Instructions: Apply silicone border over primary dressing as directed. Patient Medications llergies: kiwi, Ativan, erythromycin base, Xanax, nitrofurantoin A Notifications Medication Indication Start End 06/22/2022 lidocaine DOSE topical 4 % cream - cream topical Electronic Signature(s) Signed: 06/22/2022 5:06:55 PM By: Linton Ham MD Signed: 06/22/2022 5:14:26 PM By: [name] Entered By: [name] on 06/22/2022 13:43:40 -------------------------------------------------------------------------------- Problem List Details Patient Name: Date of Service: [name], [name] NIE W. 06/22/2022 1:30 PM Medical Record Number: [id number] Patient Account Number: [id number] Date of Birth/Sex: Treating RN: [birth year] (51 y.o. F) Primary Care Provider: [name] Other Clinician: Referring Provider: Treating Provider/Extender: Wendall Mola in Treatment: 8 Active Problems ICD-10 Encounter Code Description Active Date MDM [name], [name] ([id number]) [id number] Page 4 of 6 Code Description Active Date MDM Diagnosis T81.31XD Disruption of external operation (surgical) wound, not elsewhere classified, 04/24/2022 No Yes subsequent encounter L59.8  Other specified disorders of the skin and subcutaneous tissue related to 04/24/2022 No Yes radiation S21.002D Unspecified open wound of left breast, subsequent encounter 04/24/2022 No Yes 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 04/24/2022 No Yes Inactive Problems Resolved Problems Electronic Signature(s) Signed: 06/22/2022 5:06:55 PM By: Linton Ham MD Entered By: Linton Ham on 06/22/2022 14:37:24 -------------------------------------------------------------------------------- Progress Note Details Patient Name: Date of Service: [name], [place] W. 06/22/2022 1:30 PM Medical Record Number: [id number] Patient Account Number: [id number] Date of Birth/Sex: Treating RN: [birth year] (51 y.o. F) Primary Care Provider: [name] Other Clinician: Referring Provider: Treating Provider/Extender: Donavan Burnet, Tobin Chad in Treatment: 8 Subjective History of Present Illness (HPI) ADMISSION 05/25/2022 This is a 51 year old woman who is found to have an abnormal mammogram of her left breast earlier this year showing a 13 mm group of calcifications in the upper quadrant of the left breast. 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 She underwent a left lumpectomy on 11/12/2021. The patient states the wound never really healed and was open. She underwent radiation therapy from 12/10/2021 through 01/19/22 28 fractions of 1.8GY. Essentially the wound would not heal. She was treated several times with antibiotics finally on 03/25/2022 she was taken to the OR by Dr. Barry Dienes for excision of the wound in a sinus. Operative cultures grew strep and Prevotella and a culture from the clinic [place] MRSA. It was recommended for 4 weeks of Augmentin and doxycycline by infectious disease. She was seen by wound care and she has been treating the wound with Medihoney ever since. 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 She was on meth methotrexate 20 mg once a week however this I think was put on hold because of the wound followed by Dr. Posey Pronto of rheumatology. She has asthma. Most recent breast ultrasound was on 9/7 She has a large nonhealing wound on the lateral aspect of her left breast 04/30/2022: Although I do not have a photograph to compare to last week, the RN report is that it  is substantially cleaner. She still has ample slough and nonviable fat and subcutaneous tissue present. She only was able to get her Santyl on Monday so she has just had a couple of days of treatment. 05/08/2022: The wound dimensions are about the same, but the surface is substantially cleaner. There is just a little bit of slough accumulation on the surface. 05/18/2022: The wound is a little bit smaller, but not much. It is quite a bit cleaner, however. 05/25/2022: The wound surface continues to improve. Very little slough accumulation this week. No substantial change in the wound dimensions. 06/01/2022: The wound surface continues to have a layer of slough on it. There is a crack in the surface near the 12 o'clock position that when further explored, revealed a cavity and tunnel that is about 2 cm deep. No purulent drainage or concern for infection. 12/11; left breast wound. The wound itself is somewhat smaller in size per our measurements. However she has a divot at 1:00 that measures about a 1.5 cm deep. We have been using iodoform packing and if it and Santyl to the rest of the wound This wound was initially had a left lumpectomy on 11/12/2021 she underwent 28 fractions of radiation. She potentially could benefit from hyperbaric oxygen [name], [name] ([id number])  [id number] Page 5 of 6 12/18; left breast wound. Better looking wound surface and improvement in measurements of the small tunnel. We have been using Santyl and iodoform. 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 Integumentary (Hair, Skin) Wound #1 status is Open. Original cause of wound was Surgical Injury. The date acquired was: 04/03/2022. The wound has been in treatment 8 weeks. The wound is located on the Left Breast. The wound measures 2.5cm length x 4cm width x 0.1cm depth; 7.854cm^2 area and 0.785cm^3 volume. There is Fat Layer (Subcutaneous Tissue) exposed. There is no undermining noted, however, there is tunneling at 1:00 with a maximum distance of 1cm. There is a medium amount of serosanguineous drainage noted. The wound margin is distinct with the outline attached to the wound base. There is small (1-33%) red granulation within the wound bed. There is a large (67-100%) amount of necrotic tissue within the wound bed including Adherent Slough. The periwound skin appearance had no abnormalities noted for moisture. The periwound skin appearance had no abnormalities noted for color. The periwound skin appearance exhibited: Induration. Periwound temperature was noted as No Abnormality. Assessment Active Problems ICD-10 Disruption of external operation (surgical) wound, not elsewhere classified, subsequent encounter Other specified disorders of the skin and subcutaneous tissue related to radiation Unspecified open wound of left breast, subsequent encounter Intraductal carcinoma in situ of left breast Procedures Wound #1 Pre-procedure diagnosis of Wound #1 is a Malignant  Wound located on the Left Breast . There was a Excisional Skin/Subcutaneous Tissue Debridement with a total area of 10 sq cm performed by Ricard Dillon., MD. With the following instrument(s): Curette to remove Non-Viable tissue/material. Material removed includes Subcutaneous Tissue and Slough and after achieving pain control using Lidocaine 4% T opical Solution. No specimens were taken. A time out was conducted at 13:41, prior to the start of the procedure. A Minimum amount of bleeding was controlled with Pressure. The procedure was tolerated well.  Post Debridement Measurements: 2.5cm length x 4cm width x 0.1cm depth; 0.785cm^3 volume. Character of Wound/Ulcer Post Debridement is improved. Post procedure Diagnosis Wound #1: Same as Pre-Procedure General Notes: scribed for Dr. Dellia Nims by [name], RN. Plan Follow-up Appointments: Return Appointment in 1 week. - Dr. Celine Ahr Room 3 Anesthetic: (In clinic) Topical Lidocaine 4% applied to wound bed Bathing/ Shower/ Hygiene: Other Bathing/Shower/Hygiene Orders/Instructions: - Change dressing after bathing Edema Control - Lymphedema / SCD / Other: Other Edema Control Orders/Instructions: - Keep doing the Lymphadema checks The following medication(s) was prescribed: lidocaine topical 4 % cream cream topical was prescribed at facility WOUND #1: - Breast Wound Laterality: Left Cleanser: Normal Saline (Generic) 1 x Per Day/30 Days Discharge Instructions: Cleanse the wound with Normal Saline prior to applying a clean dressing using gauze sponges, not tissue or cotton balls. Cleanser: Soap and Water 1 x Per Day/30 Days Discharge Instructions: May shower and wash wound with dial antibacterial soap and water prior to dressing change. Cleanser: Byram Ancillary Kit - 15 Day Supply (Generic) 1 x Per Day/30 Days Discharge Instructions: Use supplies as instructed; Kit contains: (15) Saline Bullets; (15) 3x3 Gauze; 15 pr Gloves Prim Dressing:  Iodoform packing strip 1/2 (in) 1 x Per Day/30 Days ary Discharge Instructions: Lightly pack into tunneled area as instructed Prim Dressing: Santyl Ointment (Generic) 1 x Per Day/30 Days ary Discharge Instructions: Apply nickel thick amount to wound bed as instructed Secondary Dressing: Zetuvit Plus Silicone Border Dressing 5x5 (in/in) (Dispense As Written) 1 x Per Day/30 Days Discharge Instructions: Apply silicone border over primary dressing as directed. [name], [name] ([id number]) [id number] Page 6 of 6 1. I did not change the primary dressing which is Santyl and iodoform. 2. Likely to need continuing mechanical debridements each visit 3. 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 Electronic Signature(s) Signed: 06/22/2022 5:06:55 PM By: Linton Ham MD Entered By: Linton Ham on 06/22/2022 14:45:22 -------------------------------------------------------------------------------- SuperBill Details Patient Name: Date of Service: [name], [name] 06/22/2022 Medical Record Number: [id number] Patient Account Number: [id number] Date of Birth/Sex: Treating RN: Nov 29, 1970 (51 y.o. F) Primary Care Provider: [name] Other Clinician: Referring Provider: Treating Provider/Extender: Donavan Burnet, [name] Weeks in Treatment: 8 Diagnosis Coding ICD-10 Codes Code Description T81.31XD Disruption of external operation (surgical) wound, not elsewhere classified, subsequent encounter 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 S21.002D Unspecified open wound of left breast, subsequent encounter 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 Facility Procedures : CPT4 Code: [id number] Description: 94076 - DEB SUBQ TISSUE 20 SQ CM/< ICD-10 Diagnosis  Description L59.8 Other specified disorders of the skin and subcutaneous tissue related to radia S21.002D Unspecified open wound of left breast, subsequent encounter Modifier: tion Quantity: 1 Physician Procedures : CPT4 Code Description Modifier [id number] - WC PHYS SUBQ TISS 20 SQ CM ICD-10 Diagnosis Description 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 S21.002D Unspecified open wound of left breast, subsequent  encounter Quantity: 1 Electronic Signature(s) Signed: 06/22/2022 5:06:55 PM By: Linton Ham MD Entered By: Linton Ham on 06/22/2022 14:45:41

## 2022-06-26 DIAGNOSIS — D649 Anemia, unspecified: Secondary | ICD-10-CM | POA: Diagnosis not present

## 2022-06-26 DIAGNOSIS — M313 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: Secondary | ICD-10-CM | POA: Diagnosis not present

## 2022-06-26 DIAGNOSIS — Z7960 Long term (current) use of unspecified immunomodulators and immunosuppressants: Secondary | ICD-10-CM | POA: Diagnosis not present

## 2022-07-02 ENCOUNTER — Other Ambulatory Visit: Payer: Self-pay | Admitting: General Surgery

## 2022-07-02 ENCOUNTER — Other Ambulatory Visit: Payer: Self-pay

## 2022-07-02 DIAGNOSIS — Z853 Personal history of malignant neoplasm of breast: Secondary | ICD-10-CM

## 2022-07-02 DIAGNOSIS — S21002S Unspecified open wound of left breast, sequela: Secondary | ICD-10-CM

## 2022-07-02 DIAGNOSIS — R921 Mammographic calcification found on diagnostic imaging of breast: Secondary | ICD-10-CM

## 2022-07-03 ENCOUNTER — Encounter (HOSPITAL_BASED_OUTPATIENT_CLINIC_OR_DEPARTMENT_OTHER): Payer: 59 | Admitting: General Surgery

## 2022-07-03 ENCOUNTER — Other Ambulatory Visit (HOSPITAL_COMMUNITY): Payer: Self-pay

## 2022-07-03 DIAGNOSIS — K219 Gastro-esophageal reflux disease without esophagitis: Secondary | ICD-10-CM | POA: Diagnosis not present

## 2022-07-03 DIAGNOSIS — Z923 Personal history of irradiation: Secondary | ICD-10-CM | POA: Diagnosis not present

## 2022-07-03 DIAGNOSIS — J45909 Unspecified asthma, uncomplicated: Secondary | ICD-10-CM | POA: Diagnosis not present

## 2022-07-03 DIAGNOSIS — D0512 Intraductal carcinoma in situ of left breast: Secondary | ICD-10-CM | POA: Diagnosis not present

## 2022-07-03 DIAGNOSIS — S21002A Unspecified open wound of left breast, initial encounter: Secondary | ICD-10-CM | POA: Diagnosis not present

## 2022-07-03 DIAGNOSIS — M313 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: Secondary | ICD-10-CM | POA: Diagnosis not present

## 2022-07-03 DIAGNOSIS — T8131XD Disruption of external operation (surgical) wound, not elsewhere classified, subsequent encounter: Secondary | ICD-10-CM | POA: Diagnosis not present

## 2022-07-03 DIAGNOSIS — C50912 Malignant neoplasm of unspecified site of left female breast: Secondary | ICD-10-CM | POA: Diagnosis not present

## 2022-07-03 NOTE — Progress Notes (Signed)
[name] ([id number]) [id number].pdf Page 1 of 9 Visit Report for 07/03/2022 Chief Complaint Document Details Patient Name: Date of Service: [name] 07/03/2022 8:30 A M Medical Record Number: [id number] Patient Account Number: [id number] Date of Birth/Sex: Treating RN: [birth year] (51 y.o. F) Primary Care Provider: Early [name] Other Clinician: Referring Provider: Treating Provider/Extender: [name], [name] in Treatment: 10 Information Obtained from: Patient Chief Complaint 04/24/2022; patient is here for a surgical wound on her left lateral breast Electronic Signature(s) Signed: 07/03/2022 8:53:31 AM By: [name] Maudlin MD FACS Entered By: [name] on 07/03/2022 08:53:30 -------------------------------------------------------------------------------- Debridement Details Patient Name: Date of Service: Unk [name] 07/03/2022 8:30 A M Medical Record Number: [id number] Patient Account Number: [id number] Date of Birth/Sex: Treating RN: 1970-09-06 (51 y.o. [name] Primary Care Provider: Early [name] Other Clinician: Referring Provider: Treating Provider/Extender: [name] in Treatment: 10 Debridement Performed for Assessment: Wound #1 Left Breast Performed By: Physician [name] Maudlin, MD Debridement Type: Debridement Level of Consciousness (Pre-procedure): Awake and Alert Pre-procedure Verification/Time Out Yes - 08:46 Taken: Start Time: 08:46 Pain Control: Lidocaine 4% T opical Solution T Area Debrided (L x [name]): otal 1 (cm) x 0.6 (cm) = 0.6 (cm) Tissue and other material debrided: Non-Viable, Slough, Subcutaneous, Slough Level: Skin/Subcutaneous Tissue Debridement Description: Excisional Instrument: Curette Bleeding: Minimum Hemostasis Achieved: Pressure Response to Treatment: Procedure was tolerated well Level of Consciousness (Post- Awake  and Alert procedure): Post Debridement Measurements of Total Wound Length: (cm) 1 Width: (cm) 0.6 Depth: (cm) 0.1 Volume: (cm) 0.047 Character of Wound/Ulcer Post Debridement: Improved Post Procedure Diagnosis Same as Pre-procedure [name] ([id number]) [id number] Page 2 of 9 Notes scribed for [name] by [name] Peals, RN Electronic Signature(s) Signed: 07/03/2022 10:54:20 AM By: [name] Maudlin MD FACS Signed: 07/03/2022 11:59:46 AM By: [name] By: [name] on 07/03/2022 08:47:08 -------------------------------------------------------------------------------- HPI Details Patient Name: Date of Service: [name], [place] [name] 07/03/2022 8:30 A M Medical Record Number: [id number] Patient Account Number: [id number] Date of Birth/Sex: Treating RN: May 17, 1971 (51 y.o. F) Primary Care Provider: Early [name] Other Clinician: Referring Provider: Treating Provider/Extender: [name], [name] in Treatment: 10 History of Present Illness HPI Description: ADMISSION 05/25/2022 This is a 51 year old woman who is found to have an abnormal mammogram of her left breast earlier this year showing a 13 mm group of calcifications in the upper quadrant of the left breast. 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 She underwent a left lumpectomy on 11/12/2021. The patient states the wound never really healed and was open. She underwent radiation therapy from 12/10/2021 through 01/19/22 28 fractions of 1.8GY. Essentially the wound would not heal. She was treated several times with antibiotics finally on 03/25/2022 she was taken to the OR by [name] for excision of the wound in a sinus. Operative cultures grew strep and Prevotella and a culture from the clinic Livedo MRSA. It was recommended for 4 [name] of Augmentin and doxycycline by  infectious disease. She was seen by wound care and she has been treating the wound with Medihoney ever since. 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 She was on meth methotrexate 20 mg once  a week however this I think was put on hold because of the wound followed by [name] of rheumatology. She has asthma. Most recent breast ultrasound was on 9/7 She has a large nonhealing wound on the lateral aspect of her left breast 04/30/2022: Although I do not have a photograph to compare to last week, the RN report is that it is substantially cleaner. She still has ample slough and nonviable fat and subcutaneous tissue present. She only was able to get her Santyl on Monday so she has just had a couple of days of treatment. 05/08/2022: The wound dimensions are about the same, but the surface is substantially cleaner. There is just a little bit of slough accumulation on the surface. 05/18/2022: The wound is a little bit smaller, but not much. It is quite a bit cleaner, however. 05/25/2022: The wound surface continues to improve. Very little slough accumulation this week. No substantial change in the wound dimensions. 06/01/2022: The wound surface continues to have a layer of slough on it. There is a crack in the surface near the 12 o'clock position that when further explored, revealed a cavity and tunnel that is about 2 cm deep. No purulent drainage or concern for infection. 12/11; left breast wound. The wound itself is somewhat smaller in size per our measurements. However she has a divot at 1:00 that measures about a 1.5 cm deep. We have been using iodoform packing and if it and Santyl to the rest of the wound This wound was initially had a left lumpectomy on  11/12/2021 she underwent 28 fractions of radiation. She potentially could benefit from hyperbaric oxygen 12/18; left breast wound. Better looking wound surface and improvement in measurements of the small tunnel. We have been using Santyl and iodoform. 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 Underneath the layer of slough, the tissue is much healthier-looking, with better color and some granulation tissue beginning to form. Electronic Signature(s) Signed: 07/03/2022 8:54:14 AM By: [name] Maudlin MD FACS Entered By: [name] on 07/03/2022 08:54:13 Physical Exam Details -------------------------------------------------------------------------------- [name] ([id number]) [id number] Page 3 of 9 Patient Name: Date of Service: [name], [name] 07/03/2022 8:30 A M Medical Record Number: [id number] Patient Account Number: [id number] Date of Birth/Sex: Treating RN: [birth year] (51 y.o. F) Primary Care Provider: Early [name] Other Clinician: Referring Provider: Treating Provider/Extender: [name], [name] in Treatment: 10 Constitutional . . . . No acute distress. Respiratory Normal work of breathing on room air. Notes 07/03/2022: The wound dimensions are about the same, but the cavity with that we have been packing is shallower. Underneath the layer of slough, the tissue is much healthier-looking, with better color and some granulation tissue beginning to form. Electronic Signature(s) Signed: 07/03/2022 8:55:40 AM By: [name] Maudlin MD FACS Entered By: [name] on 07/03/2022 08:55:40 -------------------------------------------------------------------------------- Physician Orders Details Patient Name:  Date of Service: [name], [name] 07/03/2022 8:30 A M Medical Record Number: [id number] Patient Account Number: [id number] Date of Birth/Sex: Treating RN: [birth year] (51 y.o. [name] Primary Care Provider: Early [name] Other Clinician: Referring Provider: Treating Provider/Extender: [name] in Treatment: 10 Verbal / Phone Orders: No Diagnosis Coding ICD-10 Coding Code Description S21.002D Unspecified open wound of left breast, subsequent encounter T81.31XD Disruption  of external operation (surgical) wound, not elsewhere classified, subsequent encounter 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 Follow-up Appointments ppointment in 1 week. - Dr. Celine Ahr Room 3 Return A Anesthetic (In clinic) Topical Lidocaine 4% applied to wound bed Bathing/ Shower/ Hygiene Other Bathing/Shower/Hygiene Orders/Instructions: - Change dressing after bathing Edema Control - Lymphedema / SCD / Other Other Edema Control Orders/Instructions: - Keep doing the Lymphadema checks Wound Treatment Wound #1 - Breast Wound Laterality: Left Cleanser: Normal Saline (Generic) 1 x Per Day/30 Days Discharge Instructions: Cleanse the wound with Normal Saline prior to applying a clean dressing using gauze sponges, not tissue or cotton balls. Cleanser: Soap and Water 1 x Per Day/30 Days Discharge Instructions: May shower and wash wound with dial antibacterial soap and water prior to dressing change. Cleanser: Byram Ancillary Kit - 15 Day Supply (Generic) 1 x Per Day/30 Days Discharge Instructions: Use supplies as instructed; Kit contains: (15) Saline Bullets; (15) 3x3 Gauze; 15 pr Gloves Prim Dressing: Iodoform packing strip 1/2 (in) 1 x Per Day/30 Days ary Discharge Instructions: Lightly pack into tunneled area as instructed [name], [name] ([id number])  [id number] Page 4 of 9 Prim Dressing: Santyl Ointment (Generic) 1 x Per Day/30 Days ary Discharge Instructions: Apply nickel thick amount to wound bed as instructed Secondary Dressing: Zetuvit Plus Silicone Border Dressing 5x5 (in/in) (Dispense As Written) 1 x Per Day/30 Days Discharge Instructions: Apply silicone border over primary dressing as directed. Patient Medications llergies: kiwi, Ativan, erythromycin base, Xanax, nitrofurantoin A Notifications Medication Indication Start End 07/03/2022 lidocaine DOSE topical 4 % cream - cream topical Electronic Signature(s) Signed: 07/03/2022 10:54:20 AM By: [name] Maudlin MD FACS Entered By: [name] on 07/03/2022 08:56:12 -------------------------------------------------------------------------------- Problem List Details Patient Name: Date of Service: [name], [name] 07/03/2022 8:30 A M Medical Record Number: [id number] Patient Account Number: [id number] Date of Birth/Sex: Treating RN: 1971-01-01 (51 y.o. F) Primary Care Provider: Early [name] Other Clinician: Referring Provider: Treating Provider/Extender: [name], [name] in Treatment: 10 Active Problems ICD-10 Encounter Code Description Active Date MDM Diagnosis S21.002D Unspecified open wound of left breast, subsequent encounter 04/24/2022 No Yes T81.31XD Disruption of external operation (surgical) wound, not elsewhere classified, 04/24/2022 No Yes subsequent encounter L59.8 Other specified disorders of the skin and subcutaneous tissue related to 04/24/2022 No Yes radiation 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 04/24/2022 No Yes Inactive Problems Resolved Problems Electronic Signature(s) Signed: 07/03/2022 8:53:19 AM By: [name] Maudlin MD FACS Entered By: [name] on 07/03/2022 08:53:19 [name] ([id number]) [id number] Page 5 of  9 -------------------------------------------------------------------------------- Progress Note Details Patient Name: Date of Service: [name], [name] 07/03/2022 8:30 A M Medical Record Number: [id number] Patient Account Number: [id number] Date of Birth/Sex: Treating RN: 1970/08/31 (51 y.o. F) Primary Care Provider: Early [name] Other Clinician: Referring Provider: Treating Provider/Extender: [name], [name] in Treatment: 10 Subjective Chief Complaint Information obtained from Patient 04/24/2022; patient is here for a surgical wound on her left lateral breast History of Present Illness (HPI) ADMISSION 05/25/2022 This is a 51 year old woman who is found to have an abnormal mammogram of her left breast earlier this year showing a 13 mm group of calcifications in the upper quadrant of the left breast. 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 She underwent a left lumpectomy on 11/12/2021. The patient states the  wound never really healed and was open. She underwent radiation therapy from 12/10/2021 through 01/19/22 28 fractions of 1.8GY. Essentially the wound would not heal. She was treated several times with antibiotics finally on 03/25/2022 she was taken to the OR by [name] for excision of the wound in a sinus. Operative cultures grew strep and Prevotella and a culture from the clinic L[place]o MRSA. It was recommended for 4 [name] of Augmentin and doxycycline by infectious disease. She was seen by wound care and she has been treating the wound with Medihoney ever since. 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 She was on meth methotrexate 20 mg once  a week however this I think was put on hold because of the wound followed by [name] of rheumatology. She has asthma. Most recent breast ultrasound was on 9/7 She has a large nonhealing wound on the lateral aspect of her left breast 04/30/2022: Although I do not have a photograph to compare to last week, the RN report is that it is substantially cleaner. She still has ample slough and nonviable fat and subcutaneous tissue present. She only was able to get her Santyl on Monday so she has just had a couple of days of treatment. 05/08/2022: The wound dimensions are about the same, but the surface is substantially cleaner. There is just a little bit of slough accumulation on the surface. 05/18/2022: The wound is a little bit smaller, but not much. It is quite a bit cleaner, however. 05/25/2022: The wound surface continues to improve. Very little slough accumulation this week. No substantial change in the wound dimensions. 06/01/2022: The wound surface continues to have a layer of slough on it. There is a crack in the surface near the 12 o'clock position that when further explored, revealed a cavity and tunnel that is about 2 cm deep. No purulent drainage or concern for infection. 12/11; left breast wound. The wound itself is somewhat smaller in size per our measurements. However she has a divot at 1:00 that measures about a 1.5 cm deep. We have been using iodoform packing and if it and Santyl to the rest of the wound This wound was initially had a left lumpectomy on 11/12/2021 she underwent 28 fractions of radiation. She potentially could benefit from hyperbaric oxygen 12/18; left breast wound. Better looking wound surface and improvement in measurements of the small tunnel. We have been using Santyl and iodoform. 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 Underneath the layer of slough, the tissue is much healthier-looking, with better color and some granulation tissue beginning to form. Patient History Information obtained from Patient. Social History Never smoker, Marital Status - Married, Alcohol Use - Moderate, Drug Use - No History, Caffeine Use - Daily. Medical History Hematologic/Lymphatic Patient has history of Lymphedema - Left breast-gets Lymphadema tx Hospitalization/Surgery History - 03/25/22 Left Breast cyst excision; 11/12/21 Left Breast Lumpectomy with Radioactive seeds;Left breat Biopsy;Anterior. 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 12/08/21- 01/29/22- 37  rounds of Radiation Psychiatric [name], [name] ([id number]) [phone].pdf Page 6 of 9 General Anxiety Objective Constitutional No acute distress. Vitals Time Taken: 8:30 AM, Height: 63 in, Weight: 170 lbs, BMI: 30.1, Temperature: 98.1 F, Pulse: 73 bpm, Respiratory Rate: 20 breaths/min, Blood Pressure: 119/77 mmHg. Respiratory Normal work of breathing on room air. General Notes: 07/03/2022: The wound dimensions are about the same, but the cavity with that we have been packing is shallower. Underneath the layer of slough, the tissue is much healthier-looking, with better color and some granulation tissue beginning to form. Integumentary (Hair, Skin) Wound #1 status is Open. Original cause of wound was Surgical Injury. The date acquired was: 04/03/2022. The wound has been in treatment 10 [name] The wound is located on the Left Breast. The wound measures 1cm length x 0.6cm width x 0.1cm depth; 0.471cm^2 area and 0.047cm^3 volume. There is Fat Layer (Subcutaneous Tissue) exposed. There is no undermining  noted, however, there is tunneling at 12:00 with a maximum distance of 0.8cm. There is a medium amount of serosanguineous drainage noted. The wound margin is distinct with the outline attached to the wound base. There is small (1-33%) red granulation within the wound bed. There is a large (67-100%) amount of necrotic tissue within the wound bed including Adherent Slough. The periwound skin appearance had no abnormalities noted for moisture. The periwound skin appearance had no abnormalities noted for color. The periwound skin appearance exhibited: Induration. Periwound temperature was noted as No Abnormality. Assessment Active Problems ICD-10 Unspecified open wound of left breast, subsequent encounter Disruption of external operation (surgical) wound, not elsewhere classified, subsequent encounter Other specified disorders of the skin and subcutaneous tissue related to radiation Intraductal carcinoma in situ of left breast Procedures Wound #1 Pre-procedure diagnosis of Wound #1 is a Malignant Wound located on the Left Breast . There was a Excisional Skin/Subcutaneous Tissue Debridement with a total area of 0.6 sq cm performed by [name] Maudlin, MD. With the following instrument(s): Curette to remove Non-Viable tissue/material. Material removed includes Subcutaneous Tissue and Slough and after achieving pain control using Lidocaine 4% T opical Solution. No specimens were taken. A time out was conducted at 08:46, prior to the start of the procedure. A Minimum amount of bleeding was controlled with Pressure. The procedure was tolerated well. Post Debridement Measurements: 1cm length x 0.6cm width x 0.1cm depth; 0.047cm^3 volume. Character of Wound/Ulcer Post Debridement is improved. Post procedure Diagnosis Wound #1: Same as Pre-Procedure General Notes: scribed for [name] by [name] Peals, RN. Plan Follow-up Appointments: Return Appointment in 1 week. - Dr. Celine Ahr Room  3 Anesthetic: (In clinic) Topical Lidocaine 4% applied to wound bed Bathing/ Shower/ Hygiene: Other Bathing/Shower/Hygiene Orders/Instructions: - Change dressing after bathing Edema Control - Lymphedema / SCD / Other: Other Edema Control Orders/Instructions: - Keep doing the Lymphadema checks The following medication(s) was prescribed: lidocaine topical 4 % cream cream topical was prescribed at facility WOUND #1: - Breast Wound Laterality: Left Cleanser: Normal Saline (Generic) 1 x Per Day/30 Days Discharge Instructions: Cleanse the wound with Normal Saline prior to applying a clean dressing using gauze sponges, not tissue or cotton balls. Cleanser: Soap and Water 1 x Per Day/30 Days [name], [name] ([id number]) [phone].pdf Page 7 of 9 Discharge Instructions: May shower and wash wound with dial antibacterial soap and water prior to dressing change. Cleanser: Byram Ancillary Kit - 15 Day Supply (Generic) 1 x Per Day/30 Days Discharge Instructions: Use supplies as instructed; Kit contains: (15) Saline Bullets; (15) 3x3  Gauze; 15 pr Gloves Prim Dressing: Iodoform packing strip 1/2 (in) 1 x Per Day/30 Days ary Discharge Instructions: Lightly pack into tunneled area as instructed Prim Dressing: Santyl Ointment (Generic) 1 x Per Day/30 Days ary Discharge Instructions: Apply nickel thick amount to wound bed as instructed Secondary Dressing: Zetuvit Plus Silicone Border Dressing 5x5 (in/in) (Dispense As Written) 1 x Per Day/30 Days Discharge Instructions: Apply silicone border over primary dressing as directed. 07/03/2022: The wound dimensions are about the same, but the cavity with that we have been packing is shallower. Underneath the layer of slough, the tissue is much healthier-looking, with better color and some granulation tissue beginning to form. I used a curette to debride slough and nonviable subcutaneous tissue from the wound. We will continue to use Santyl  on the surface and packed the cavity with iodoform packing strips. She is now 6 months out from her radiation therapy and still has an open wound. She meets criteria for hyperbaric oxygen therapy so we will initiate the process of getting insurance approval after the first of the year. Electronic Signature(s) Signed: 07/03/2022 8:57:09 AM By: [name] Maudlin MD FACS Entered By: [name] on 07/03/2022 08:57:08 -------------------------------------------------------------------------------- HxROS Details Patient Name: Date of Service: [name], [place] [name] 07/03/2022 8:30 A M Medical Record Number: [id number] Patient Account Number: [id number] Date of Birth/Sex: Treating RN: 1971/05/09 (51 y.o. F) Primary Care Provider: Early [name] Other Clinician: Referring Provider: Treating Provider/Extender: [name], [name] in Treatment: 10 Information Obtained From Patient Ear/Nose/Mouth/Throat Medical History: Past Medical History Notes: Left ear limited hearing Hematologic/Lymphatic Medical History: Positive for: Lymphedema - Left breast-gets Lymphadema tx Gastrointestinal Medical History: Past Medical History Notes: GERD Immunological Medical History: Past Medical History Notes: Wegener"s Granulomatosis Musculoskeletal Medical History: Past Medical History Notes: "RA like arthritis" as per patient Oncologic Medical History: Past Medical History Notes: Left Breast. 12/08/21- 01/29/22- 37 rounds of Radiation [name] ([id number]) [id number] Page 8 of 9 Psychiatric Medical History: Past Medical History Notes: General Anxiety Immunizations Pneumococcal Vaccine: Received Pneumococcal Vaccination: Yes Received Pneumococcal Vaccination On or After 60th Birthday: No Implantable Devices Yes Hospitalization / Surgery History Type of Hospitalization/Surgery 03/25/22 Left Breast cyst excision; 11/12/21 Left Breast  Lumpectomy with Radioactive seeds;Left breat Biopsy;Anterior Family and Social History Never smoker; Marital Status - Married; Alcohol Use: Moderate; Drug Use: No History; Caffeine Use: Daily; Financial Concerns: No; Food, Clothing or Shelter Needs: No; Support System Lacking: No; Transportation Concerns: No Electronic Signature(s) Signed: 07/03/2022 10:54:20 AM By: [name] Maudlin MD FACS Entered By: [name] on 07/03/2022 08:55:10 -------------------------------------------------------------------------------- SuperBill Details Patient Name: Date of Service: [name] Mattes. 07/03/2022 Medical Record Number: [id number] Patient Account Number: [id number] Date of Birth/Sex: Treating RN: 1971/02/05 (51 y.o. 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 Facility Procedures : CPT4 Code: 77824235 Description: 36144 - DEB SUBQ TISSUE 20 SQ CM/< ICD-10 Diagnosis Description S21.002D Unspecified open wound of left breast, subsequent encounter T81.31XD Disruption of external operation (surgical) wound, not elsewhere classified, subs L59.8 Other  specified disorders of the skin and subcutaneous tissue related to radiatio Modifier: equent encounter n Quantity: 1 Physician Procedures : RXV4  Code Description Modifier [id number] 99214 - WC PHYS LEVEL 4 - EST PT 25 ICD-10 Diagnosis Description S21.002D Unspecified open wound of left breast, subsequent encounter T81.31XD Disruption of external operation (surgical)  wound, not elsewhere  classified, subsequent encounter 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

## 2022-07-03 NOTE — Progress Notes (Signed)
STEPH, CHEADLE ([id number]) [phone].pdf Page 1 of 7 Visit Report for 07/03/2022 Arrival Information Details Patient Name: Date of Service: [name] 07/03/2022 8:30 A M Medical Record Number: [id number] Patient Account Number: [id number] Date of Birth/Sex: Treating RN: 10/29/1970 (51 y.o. F) Primary Care Seven Dollens: [name] Other Clinician: Referring Demyah Smyre: Treating Cordia Miklos/Extender: [name], [name] Weeks in Treatment: 10 Visit Information History Since Last Visit All ordered tests and consults were completed: No Patient Arrived: Ambulatory Added or deleted any medications: No Arrival Time: 08:29 Any new allergies or adverse reactions: No Accompanied By: self Had a fall or experienced change in No Transfer Assistance: None activities of daily living that may affect Patient Identification Verified: Yes risk of falls: Secondary Verification Process Completed: Yes Signs or symptoms of abuse/neglect since last visito No Patient Requires Transmission-Based Precautions: No Hospitalized since last visit: No Patient Has Alerts: No Implantable device outside of the clinic excluding No cellular tissue based products placed in the center since last visit: Pain Present Now: No Electronic Signature(s) Signed: 07/03/2022 9:12:32 AM By: Worthy Rancher Entered By: Worthy Rancher on 07/03/2022 08:29:57 -------------------------------------------------------------------------------- Encounter Discharge Information Details Patient Name: Date of Service: [name] RD, [place] W. 07/03/2022 8:30 A M Medical Record Number: [id number] Patient Account Number: [id number] Date of Birth/Sex: Treating RN: 12-25-1970 (51 y.o. [name] Anastasiya Gowin: [name] Other Clinician: Referring Erickson Yamashiro: Treating Jaecob Lowden/Extender: Shea Stakes in Treatment: 10 Encounter Discharge Information Items Post  Procedure Vitals Discharge Condition: Stable Temperature (F): 98.1 Ambulatory Status: Ambulatory Pulse (bpm): 73 Discharge Destination: Home Respiratory Rate (breaths/min): 20 Transportation: Private Auto Blood Pressure (mmHg): 119/77 Accompanied By: self Schedule Follow-up Appointment: Yes Clinical Summary of Care: Patient Declined Electronic Signature(s) Signed: 07/03/2022 11:59:46 AM By: [name] Entered By: [name] on 07/03/2022 08:57:57 [name] ([id number]) [id number] Page 2 of 7 -------------------------------------------------------------------------------- Lower Extremity Assessment Details Patient Name: Date of Service: [name] 07/03/2022 8:30 A M Medical Record Number: [id number] Patient Account Number: [id number] Date of Birth/Sex: Treating RN: 1970-08-03 (51 y.o. [name] Lynde Ludwig: [name] Other Clinician: Referring Austynn Pridmore: Treating Sherwood Castilla/Extender: [name], [name] Weeks in Treatment: 10 Electronic Signature(s) Signed: 07/03/2022 11:59:46 AM By: Sabas Sous By: [name] on 07/03/2022 08:40:12 -------------------------------------------------------------------------------- Multi Wound Chart Details Patient Name: Date of Service: [name] RD, [place] W. 07/03/2022 8:30 A M Medical Record Number: [id number] Patient Account Number: [id number] Date of Birth/Sex: Treating RN: [birth year] (51 y.o. F) Primary Care Zauria Dombek: [name] Other Clinician: Referring Jed Kutch: Treating Felice Hope/Extender: [name], [name] Weeks in Treatment: 10 Vital Signs Height(in): 63 Pulse(bpm): 73 Weight(lbs): 170 Blood Pressure(mmHg): 119/77 Body Mass Index(BMI): 30.1 Temperature(F): 98.1 Respiratory Rate(breaths/min): 20 [1:Photos:] [N/A:N/A] Left Breast N/A N/A Wound Location: Surgical Injury N/A N/A Wounding  Event: Malignant Wound N/A N/A Primary Etiology: Lymphedema N/A N/A Comorbid History: 04/03/2022 N/A N/A Date Acquired: 10 N/A N/A Weeks of Treatment: Open N/A N/A Wound Status: No N/A N/A Wound Recurrence: 1x0.6x0.1 N/A N/A Measurements L x W x D (cm) 0.471 N/A N/A A (cm) : rea 0.047 N/A N/A Volume (cm) : 96.50% N/A N/A % Reduction in A rea: 99.10% N/A N/A % Reduction in Volume: 12 Position 1 (o'clock): 0.8 Maximum Distance 1 (cm): Yes N/A N/A Tunneling: Full Thickness Without Exposed N/A N/A Classification: Support Structures Medium N/A N/A Exudate Amount: Serosanguineous N/A N/A Exudate Type: red, brown N/A N/A Exudate Color: Distinct, outline attached N/A N/A [address]ound  [address]KEILI, HASTEN ([id number]) [id number] Page 3 of 7 Small (1-33%) N/A N/A Granulation Amount: Red N/A N/A Granulation Quality: Large (67-100%) N/A N/A Necrotic Amount: Fat Layer (Subcutaneous Tissue): Yes N/A N/A Exposed Structures: Fascia: No Tendon: No Muscle: No Joint: No Bone: No Small (1-33%) N/A N/A Epithelialization: Debridement - Excisional N/A N/A Debridement: Pre-procedure Verification/Time Out 08:46 N/A N/A Taken: Lidocaine 4% Topical Solution N/A N/A Pain Control: Subcutaneous, Slough N/A N/A Tissue Debrided: Skin/Subcutaneous Tissue N/A N/A Level: 0.6 N/A N/A Debridement A (sq cm): rea Curette N/A N/A Instrument: Minimum N/A N/A Bleeding: Pressure N/A N/A Hemostasis A chieved: Procedure was tolerated well N/A N/A Debridement Treatment Response: 1x0.6x0.1 N/A N/A Post Debridement Measurements L x W x D (cm) 0.047 N/A N/A Post Debridement Volume: (cm) Induration: Yes N/A N/A Periwound Skin Texture: No Abnormalities Noted N/A N/A Periwound Skin Moisture: No Abnormalities Noted N/A N/A Periwound Skin Color: No Abnormality N/A N/A Temperature: Debridement N/A N/A Procedures Performed: Treatment Notes Electronic  Signature(s) Signed: 07/03/2022 8:53:25 AM By: [name] MD FACS Entered By: [name] on 07/03/2022 08:53:24 -------------------------------------------------------------------------------- Multi-Disciplinary Care Plan Details Patient Name: Date of Service: [name] RD, [place] W. 07/03/2022 8:30 A M Medical Record Number: [id number] Patient Account Number: [id number] Date of Birth/Sex: Treating RN: August 26, 1970 (51 y.o. [name] Miel Wisener: [name] Other Clinician: Referring Kelcey Wickstrom: Treating Krosby Ritchie/Extender: Shea Stakes in Treatment: [place] reviewed with physician Active Inactive Wound/Skin Impairment Nursing Diagnoses: Impaired tissue integrity Goals: Patient/caregiver will verbalize understanding of skin care regimen Date Initiated: 04/24/2022 Target Resolution Date: 08/07/2022 Goal Status: Active Interventions: Assess ulceration(s) every visit Treatment Activities: Skin care regimen initiated : 04/24/2022 Notes: Electronic Signature(s) YACHET, MATTSON ([id number]) [phone].pdf Page 4 of 7 Signed: 07/03/2022 11:59:46 AM By: Sabas Sous By: [name] on 07/03/2022 08:45:30 -------------------------------------------------------------------------------- Pain Assessment Details Patient Name: Date of Service: [name] 07/03/2022 8:30 A M Medical Record Number: [id number] Patient Account Number: [id number] Date of Birth/Sex: Treating RN: Mar 18, 1971 (51 y.o. F) Primary Care Nikolis Berent: [name] Other Clinician: Referring Noah Lembke: Treating Elnita Surprenant/Extender: [name], [name] Weeks in Treatment: 10 Active Problems Location of Pain Severity and Description of Pain Patient Has Paino No Site Locations Pain Management and Medication Current Pain Management: Electronic Signature(s) Signed: 07/03/2022  9:12:32 AM By: Worthy Rancher Entered By: Worthy Rancher on 07/03/2022 08:30:27 -------------------------------------------------------------------------------- Patient/Caregiver Education Details Patient Name: Date of Service: [name] 12/29/2023andnbsp8:30 A M Medical Record Number: [id number] Patient Account Number: [id number] Date of Birth/Gender: Treating RN: January 24, 1971 (51 y.o. [name] Physician: [name] Other Clinician: Referring Physician: Treating Physician/Extender: Shea Stakes in Treatment: 10 Education Assessment Education Provided To: Patient MONCHEL, POLLITT ([id number]) [id number] Page 5 of 7 Education Topics Provided Wound/Skin Impairment: Methods: Explain/Verbal Responses: Reinforcements needed, State content correctly Electronic Signature(s) Signed: 07/03/2022 11:59:46 AM By: [name] Entered By: [name] on 07/03/2022 08:45:42 -------------------------------------------------------------------------------- Wound Assessment Details Patient Name: Date of Service: [name] 07/03/2022 8:30 A M Medical Record Number: [id number] Patient Account Number: [id number] Date of Birth/Sex: Treating RN: 10/06/1970 (51 y.o. F) Primary Care Jaylee Lantry: [name] Other Clinician: Referring Dyon Rotert: Treating Seline Enzor/Extender: [name], [name] Weeks in Treatment: 10 Wound Status Wound Number: 1 Primary Etiology: Malignant Wound Wound Location: Left Breast Wound Status: Open Wounding Event: Surgical Injury Comorbid History: Lymphedema Date Acquired: 04/03/2022 Weeks Of Treatment: 10 Clustered Wound: No Photos Wound Measurements  Length: (cm) 1 Width: (cm) 0.6 Depth: (cm) 0.1 Area: (cm) 0.471 Volume: (cm) 0.047 % Reduction in Area: 96.5% % Reduction in Volume: 99.1% Epithelialization: Small (1-33%) Tunneling: Yes Position  (o'clock): 12 Maximum Distance: (cm) 0.8 Undermining: No Wound Description Classification: Full Thickness Without Exposed Suppor Wound Margin: Distinct, outline attached Exudate Amount: Medium Exudate Type: Serosanguineous Exudate Color: red, brown t Structures Foul Odor After Cleansing: No Slough/Fibrino Yes Wound Bed Granulation Amount: Small (1-33%) Exposed Structure Granulation Quality: Red Fascia Exposed: No Necrotic Amount: Large (67-100%) Fat Layer (Subcutaneous Tissue) Exposed: Yes Necrotic Quality: Adherent Slough Tendon Exposed: No Muscle Exposed: No Joint Exposed: No AUNDREA, HORACE ([id number]) [id number] Page 6 of 7 Bone Exposed: No Periwound Skin Texture Texture Color No Abnormalities Noted: No No Abnormalities Noted: Yes Induration: Yes Temperature / Pain Temperature: No Abnormality Moisture No Abnormalities Noted: Yes Treatment Notes Wound #1 (Breast) Wound Laterality: Left Cleanser Normal Saline Discharge Instruction: Cleanse the wound with Normal Saline prior to applying a clean dressing using gauze sponges, not tissue or cotton balls. Soap and Water Discharge Instruction: May shower and wash wound with dial antibacterial soap and water prior to dressing change. [place] Ancillary Kit - 15 Day Supply Discharge Instruction: Use supplies as instructed; Kit contains: (15) Saline Bullets; (15) 3x3 Gauze; 15 pr Gloves Peri-Wound Care Topical Primary Dressing Iodoform packing strip 1/2 (in) Discharge Instruction: Lightly pack into tunneled area as instructed Santyl Ointment Discharge Instruction: Apply nickel thick amount to wound bed as instructed Secondary Dressing Zetuvit Plus Silicone Border Dressing 5x5 (in/in) Discharge Instruction: Apply silicone border over primary dressing as directed. Secured With Compression Wrap Compression Stockings Environmental education officer) Signed: 07/03/2022 11:59:46 AM By: [name] Entered By: [name] on 07/03/2022 08:41:02 -------------------------------------------------------------------------------- Vitals Details Patient Name: Date of Service: [name] RD, [place] W. 07/03/2022 8:30 A M Medical Record Number: [id number] Patient Account Number: [id number] Date of Birth/Sex: Treating RN: Feb 03, 1971 (51 y.o. F) Primary Care Vira Chaplin: [name] Other Clinician: Referring Jadaya [place]mer[place]: Treating Desirre Eickhoff/Extender: [name], [name] Weeks in Treatment: 10 Vital Signs Time Taken: 08:30 Temperature (F): 98.1 Height (in): 63 Pulse (bpm): 73 Weight (lbs): 170 Respiratory Rate (breaths/min): 20 Body Mass Index (BMI): 30.1 Blood Pressure (mmHg): 119/77 Reference Range: 80 - 120 mg / dl Electronic Signature(s) Signed: 07/03/2022 9:12:32 AM By: Yehuda Mao ([id number]) ByMauri [place].pdf Page 7 of 7 Signed: 07/03/2022 9:12:32 AM Entered By: Worthy Rancher on 07/03/2022 08:30:19

## 2022-07-04 ENCOUNTER — Other Ambulatory Visit: Payer: Self-pay

## 2022-07-05 DIAGNOSIS — Z760 Encounter for issue of repeat prescription: Secondary | ICD-10-CM | POA: Diagnosis not present

## 2022-07-06 ENCOUNTER — Other Ambulatory Visit: Payer: Self-pay

## 2022-07-06 ENCOUNTER — Other Ambulatory Visit (HOSPITAL_COMMUNITY): Payer: Self-pay

## 2022-07-06 MED ORDER — SERTRALINE HCL 100 MG PO TABS
100.0000 mg | ORAL_TABLET | Freq: Every day | ORAL | 0 refills | Status: DC
  Filled 2022-07-06: qty 90, 90d supply, fill #0

## 2022-07-07 ENCOUNTER — Other Ambulatory Visit: Payer: Self-pay

## 2022-07-13 ENCOUNTER — Encounter (HOSPITAL_BASED_OUTPATIENT_CLINIC_OR_DEPARTMENT_OTHER): Payer: 59 | Attending: General Surgery | Admitting: General Surgery

## 2022-07-13 DIAGNOSIS — Y838 Other surgical procedures as the cause of abnormal reaction of the patient, or of later complication, without mention of misadventure at the time of the procedure: Secondary | ICD-10-CM | POA: Diagnosis not present

## 2022-07-13 DIAGNOSIS — T8131XA Disruption of external operation (surgical) wound, not elsewhere classified, initial encounter: Secondary | ICD-10-CM | POA: Insufficient documentation

## 2022-07-13 DIAGNOSIS — M313 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: Secondary | ICD-10-CM | POA: Diagnosis not present

## 2022-07-13 DIAGNOSIS — D0512 Intraductal carcinoma in situ of left breast: Secondary | ICD-10-CM | POA: Diagnosis not present

## 2022-07-13 DIAGNOSIS — C50912 Malignant neoplasm of unspecified site of left female breast: Secondary | ICD-10-CM | POA: Diagnosis not present

## 2022-07-13 NOTE — Progress Notes (Signed)
RAIMI, GUILLERMO ([id number]) [id number] Page 1 of 11 Visit Report for 07/13/2022 Chief Complaint Document Details Patient Name: Date of Service: [name], [name] 07/13/2022 10:00 A M Medical Record Number: [id number] Patient Account Number: [id number] Date of Birth/Sex: Treating RN: 02/05/1971 (52 y.o. F) Primary Care Provider: Early [name] Other Clinician: Referring Provider: Treating Provider/Extender: [name], [name] in Treatment: 11 Information Obtained from: Patient Chief Complaint 04/24/2022; patient is here for a surgical wound on her left lateral breast Electronic Signature(s) Signed: 07/13/2022 10:16:33 AM By: [name] Maudlin MD FACS Entered By: [name] on 07/13/2022 10:16:33 -------------------------------------------------------------------------------- Debridement Details Patient Name: Date of Service: [name] Lightning NIE [name] 07/13/2022 10:00 A M Medical Record Number: [id number] Patient Account Number: [id number] Date of Birth/Sex: Treating RN: 05/25/1971 (52 y.o. [name] Provider: Early [name] Other Clinician: Referring Provider: Treating Provider/Extender: [name] in Treatment: 11 Debridement Performed for Assessment: Wound #1 Left Breast Performed By: Physician [name] Maudlin, MD Debridement Type: Debridement Level of Consciousness (Pre-procedure): Awake and Alert Pre-procedure Verification/Time Out Yes - 10:08 Taken: Start Time: 10:08 Pain Control: Lidocaine 5% topical ointment T Area Debrided (L x [name]): otal 2.4 (cm) x 3.5 (cm) = 8.4 (cm) Tissue and other material debrided: Non-Viable, Slough, Slough Level: Non-Viable Tissue Debridement Description: Selective/Open Wound Instrument: Curette Bleeding: Minimum Hemostasis Achieved: Pressure Response to Treatment: Procedure was tolerated well Level of Consciousness (Post- Awake and  Alert procedure): Post Debridement Measurements of Total Wound Length: (cm) 2.4 Width: (cm) 3.5 Depth: (cm) 0.1 Volume: (cm) 0.66 Character of Wound/Ulcer Post Debridement: Improved Post Procedure Diagnosis Same as Pre-procedure [name], [name] ([id number]) [id number] Page 2 of 11 Notes scribed for Dr. Celine [name] by [name] Peals, RN Electronic Signature(s) Signed: 07/13/2022 11:45:20 AM By: [name] Maudlin MD FACS Signed: 07/13/2022 4:13:50 PM By: [name] By: [name] on 07/13/2022 10:09:39 -------------------------------------------------------------------------------- HPI Details Patient Name: Date of Service: [name], [place] [name] 07/13/2022 10:00 A M Medical Record Number: [id number] Patient Account Number: [id number] Date of Birth/Sex: Treating RN: Nov 03, 1970 (52 y.o. F) Primary Care Provider: Early [name] Other Clinician: Referring Provider: Treating Provider/Extender: [name], [name] in Treatment: 11 History of Present Illness HPI Description: ADMISSION 05/25/2022 This is a 52 year old woman who is found to have an abnormal mammogram of her left breast earlier this year showing a 13 mm group of calcifications in the upper quadrant of the left breast. 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 She underwent a left lumpectomy on 11/12/2021. The patient states the wound never really healed and was open. She underwent radiation therapy from 12/10/2021 through 01/19/22 28 fractions of 1.8GY. Essentially the wound would not heal. She was treated several times with antibiotics finally on 03/25/2022 she was taken to the OR by Dr. Barry [name] for excision of the wound in a sinus. Operative cultures grew strep and Prevotella and a culture from the clinic [place] MRSA. It was recommended for 4 [name] of Augmentin and doxycycline by infectious  disease. She was seen by wound care and she has been treating the wound with Medihoney ever since. 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 She was on meth methotrexate 20 mg once a  week however this I think was put on hold because of the wound followed by [name] of rheumatology. She has asthma. Most recent breast ultrasound was on 9/7 She has a large nonhealing wound on the lateral aspect of her left breast 04/30/2022: Although I do not have a photograph to compare to last week, the RN report is that it is substantially cleaner. She still has ample slough and nonviable fat and subcutaneous tissue present. She only was able to get her Santyl on Monday so she has just had a couple of days of treatment. 05/08/2022: The wound dimensions are about the same, but the surface is substantially cleaner. There is just a little bit of slough accumulation on the surface. 05/18/2022: The wound is a little bit smaller, but not much. It is quite a bit cleaner, however. 05/25/2022: The wound surface continues to improve. Very little slough accumulation this week. No substantial change in the wound dimensions. 06/01/2022: The wound surface continues to have a layer of slough on it. There is a crack in the surface near the 12 o'clock position that when further explored, revealed a cavity and tunnel that is about 2 cm deep. No purulent drainage or concern for infection. 12/11; left breast wound. The wound itself is somewhat smaller in size per our measurements. However she has a divot at 1:00 that measures about a 1.5 cm deep. We have been using iodoform packing and if it and Santyl to the rest of the wound This wound was initially had a left lumpectomy on 11/12/2021 she  underwent 28 fractions of radiation. She potentially could benefit from hyperbaric oxygen 12/18; left breast wound. Better looking wound surface and improvement in measurements of the small tunnel. We have been using Santyl and iodoform. 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 Underneath the layer of slough, the tissue is much healthier-looking, with better color and some granulation tissue beginning to form. 07/13/2022: The wound continues to fill with better looking granulation tissue. There is very little slough accumulation and the cavity is shallower. We are going to submit for insurance approval for hyperbaric oxygen therapy, as it has now been 6 months since the time of her initial wound and radiation therapy. Electronic Signature(s) Signed: 07/13/2022 10:17:43 AM By: [name] Maudlin MD FACS Entered By: [name] on 07/13/2022 10:17:43 [name] ([id number]) [id number] Page 3 of 11 -------------------------------------------------------------------------------- Physical Exam Details Patient Name: Date of Service: [name], [name] 07/13/2022 10:00 A M Medical Record Number: [id number] Patient Account Number: [id number] Date of Birth/Sex: Treating RN: [birth year] (52 y.o. F) Primary Care Provider: Early [name] Other Clinician: Referring Provider: Treating Provider/Extender: [name], [name] in Treatment: 11 Constitutional . . . . no acute distress. Respiratory Normal work of breathing on room air. Notes 07/13/2022: The wound continues to fill with better looking granulation tissue. There is very little slough accumulation and the cavity is shallower. Electronic Signature(s) Signed: 07/13/2022 10:18:21 AM By: [name] Maudlin MD FACS Entered By: [name] on 07/13/2022 10:18:21 -------------------------------------------------------------------------------- Physician Orders Details Patient Name: Date of Service: [name], [name] 07/13/2022 10:00 A M Medical Record Number: [id number] Patient Account Number: [id number] Date of Birth/Sex: Treating RN: October 27, 1970 (52 y.o. [name] Provider: Early [name] Other Clinician: Referring  Provider: Treating Provider/Extender: [name], [name] in Treatment: 44 Verbal / Phone Orders: No Diagnosis Coding ICD-10 Coding Code Description S21.002D Unspecified open wound of left breast, subsequent encounter T81.31XD Disruption of external operation (surgical) wound, not elsewhere classified, subsequent encounter 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 Follow-up Appointments ppointment in 1 week. - Dr. Celine [name] Room 3 Return A Anesthetic (In clinic) Topical Lidocaine 5% applied to wound bed Bathing/ Shower/ Hygiene Other Bathing/Shower/Hygiene Orders/Instructions: - Change dressing after bathing Edema Control - Lymphedema / SCD / Other Other Edema Control Orders/Instructions: - Keep doing the Lymphadema checks Hyperbaric Oxygen Therapy Evaluate for HBO Therapy Indication: - soft tissue radionecrosis If appropriate for treatment, begin HBOT per protocol: 2.0 ATA for 90 Minutes without A Breaks ir Total Number of Treatments: - 40 One treatments per day (delivered Monday through Friday unless otherwise specified in Special Instructions below): EMMALYNE, GIACOMO ([id number]) [id number] Page 4 of 11 Finger stick Blood Glucose Pre- and Post- HBOT Treatment. Follow Hyperbaric Oxygen Glycemia Protocol Afrin (Oxymetazoline HCL) 0.05% nasal spray - 1 spray in both nostrils daily as needed  prior to HBO treatment for difficulty clearing ears Wound Treatment Wound #1 - Breast Wound Laterality: Left Cleanser: Normal Saline (Generic) 1 x Per Day/30 Days Discharge Instructions: Cleanse the wound with Normal Saline prior to applying a clean dressing using gauze sponges, not tissue or cotton balls. Cleanser: Soap and Water 1 x Per Day/30 Days Discharge Instructions: May shower and wash wound with dial antibacterial soap and water prior to dressing change. Cleanser: [place] Ancillary Kit - 15 Day Supply (Generic) 1 x Per Day/30 Days Discharge Instructions: Use supplies as instructed; Kit contains: (15) Saline Bullets; (15) 3x3 Gauze; 15 pr Gloves Prim Dressing: Iodoform packing strip 1/2 (in) 1 x Per Day/30 Days ary Discharge Instructions: Lightly pack into tunneled area as instructed Prim Dressing: Santyl Ointment (Generic) 1 x Per Day/30 Days ary Discharge Instructions: Apply nickel thick amount to wound bed as instructed Secondary Dressing: Zetuvit Plus Silicone Border Dressing 5x5 (in/in) (Dispense As Written) 1 x Per Day/30 Days Discharge Instructions: Apply silicone border over primary dressing as directed. Radiology X-ray, Chest - 2 view PA and lateral of chest for hyperbaric oxygen therapy protocol ICD10: T81.31XD CPT 71046 : [employer] EKG - EKG for hyperbaric oxygen therapy protocol ICD10: T81.31XD Patient Medications llergies: kiwi, Ativan, erythromycin base, Xanax, nitrofurantoin A Notifications Medication Indication Start End 07/13/2022 lidocaine DOSE topical 5 % ointment - ointment topical GLYCEMIA INTERVENTIONS PROTOCOL PRE-HBO GLYCEMIA INTERVENTIONS ACTION INTERVENTION Obtain pre-HBO capillary blood glucose (ensure 1 physician order is in chart). A. Notify HBO physician and await physician orders. 2 If result is 70 mg/dl or below: B. If the result meets the hospital definition of a critical result, follow hospital policy. A. Give patient an 8 ounce  Glucerna Shake, an 8 ounce Ensure, or 8 ounces of a Glucerna/Ensure equivalent dietary supplement*. B. Wait 30 minutes. If result is 71 mg/dl to 130 mg/dl: C. Retest patients capillary blood glucose (CBG). D. If result greater than or equal to 110 mg/dl, proceed with HBO. If result less than 110 mg/dl, notify HBO physician and consider holding HBO. If result is 131 mg/dl to 249 mg/dl: A. Proceed with HBO. A. Notify HBO physician and await physician orders. B. It is recommended to hold HBO and do If result is 250 mg/dl or greater: blood/urine ketone testing. C. If the result  meets the hospital definition of a critical result, follow hospital policy. POST-HBO GLYCEMIA INTERVENTIONS ACTION INTERVENTION Obtain post HBO capillary blood glucose (ensure 1 physician order is in chart). A. Notify HBO physician and await physician orders. 2 If result is 70 mg/dl or below: B. If the result meets the hospital definition of a critical result, follow hospital policy. A. Give patient an 8 ounce Glucerna Shake, an 8 ounce Ensure, or 8 ounces of a Glucerna/Ensure equivalent dietary supplement*. B. Wait 15 minutes for symptoms of [name], [name] ([id number]) [phone].pdf Page 5 of 11 If result is 71 mg/dl to 100 mg/dl: hypoglycemia (i.e. nervousness, anxiety, sweating, chills, clamminess, irritability, confusion, tachycardia or dizziness). C. If patient asymptomatic, discharge patient. If patient symptomatic, repeat capillary blood glucose (CBG) and notify HBO physician. If result is 101 mg/dl to 249 mg/dl: A. Discharge patient. A. Notify HBO physician and await physician orders. B. It is recommended to do blood/urine ketone If result is 250 mg/dl or greater: testing. C. If the result meets the hospital definition of a critical result, follow hospital policy. *Juice or candies are NOT equivalent products. If patient refuses the Glucerna or Ensure,  please consult the hospital dietitian for an appropriate substitute. Electronic Signature(s) Signed: 07/13/2022 11:45:20 AM By: [name] Maudlin MD FACS Entered By: [name] on 07/13/2022 10:18:57 Prescription 07/13/2022 -------------------------------------------------------------------------------- Leatha Gilding MD Patient Name: Provider: 05-25-1971 [id number] Date of Birth: NPI#Wanda Plump [id number] Sex: DEA #: [phone] Phone #: License #: [place] Patient Address: [address] [place], [place] [address] [place], [place] [address] [phone] Allergies kiwi; Ativan; erythromycin base; Xanax; nitrofurantoin Provider's Orders EKG - EKG for hyperbaric oxygen therapy protocol ICD10: T81.31XD Hand Signature: Date(s): Prescription 07/13/2022 Leatha Gilding MD Patient Name: Provider: 01/28/1971 [id number] Date of Birth: NPI#Wanda Plump [id number] Sex: DEA #: [phone] Phone #: License #: [place] Patient Address: [address], [place] [address] [place], [place] [address] [phone] Allergies kiwi; Ativan; erythromycin base; Xanax; nitrofurantoin Provider's Orders X-ray, Chest - 2 view PA and lateral of chest for hyperbaric oxygen therapy protocol ICD10: T81.31XD CPT 64158 : Hand Signature: Date(s): Electronic Signature(s) Signed: 07/13/2022 10:20:59 AM By: [name] Maudlin MD FACS [name], [name] ([id number]) By: [name] Maudlin MD FACS [phone].pdf Page 6 of 11 Signed: 07/13/2022 10:20:59 AM Entered By: [name] on 07/13/2022 10:20:59 -------------------------------------------------------------------------------- Problem List Details Patient Name: Date of Service: [name], [name] 07/13/2022 10:00 A M Medical Record Number:  [id number] Patient Account Number: [id number] Date of Birth/Sex: Treating RN: [birth year] (52 y.o. F) Primary Care Provider: Early [name] Other Clinician: Referring Provider: Treating Provider/Extender: [name], [name] in Treatment: 11 Active Problems ICD-10 Encounter Code Description Active Date MDM Diagnosis S21.002D Unspecified open wound of left breast, subsequent encounter 04/24/2022 No Yes T81.31XD Disruption of external operation (surgical) wound, not elsewhere classified, 04/24/2022 No Yes subsequent encounter L59.8 Other specified disorders of the skin and subcutaneous tissue related to 04/24/2022 No Yes radiation 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 04/24/2022 No Yes Inactive Problems Resolved Problems Electronic Signature(s) Signed: 07/13/2022 10:15:49 AM By: [name] Maudlin MD FACS Entered By: [name] on 07/13/2022 10:15:49 -------------------------------------------------------------------------------- Progress Note Details Patient Name: Date of Service: [name] Lightning NIE [name] 07/13/2022 10:00 A M Medical Record Number: [id number] Patient Account Number: [id number] Date of Birth/Sex:  Treating RN: [birth year] (52 y.o. F) Primary Care Provider: Early [name] Other Clinician: Referring Provider: Treating Provider/Extender: [name], [name] in Treatment: 11 Subjective Chief Complaint Information obtained from Patient 04/24/2022; patient is here for a surgical wound on her left lateral breast LANAYSIA, FRITCHMAN ([id number]) [phone].pdf Page 7 of 11 History of Present Illness (HPI) ADMISSION 05/25/2022 This is a 52 year old woman who is found to have an abnormal mammogram of her left breast earlier this year showing a 13 mm group of calcifications in the upper quadrant of the left breast. 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 She underwent a left lumpectomy on 11/12/2021. The patient states the wound never really healed and was open. She underwent radiation therapy from 12/10/2021 through 01/19/22 28 fractions of 1.8GY. Essentially the wound would not heal. She was treated several times with antibiotics finally on 03/25/2022 she was taken to the OR by Dr. Barry [name] for excision of the wound in a sinus. Operative cultures grew strep and Prevotella and a culture from the clinic [place] MRSA. It was recommended for 4 [name] of Augmentin and doxycycline by infectious disease. She was seen by wound care and she has been treating the wound with Medihoney ever since. 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 She was on meth methotrexate 20 mg once a week however this I think was put on hold because of the wound followed by [name] of rheumatology. She has asthma. Most recent breast ultrasound was on 9/7 She has a large nonhealing wound on the lateral aspect of her left breast 04/30/2022: Although I do not have a photograph to compare to last week, the RN report is that it is substantially cleaner. She still has ample slough and nonviable fat and subcutaneous tissue present. She only was able to get her Santyl on Monday so she has just had a couple of days of treatment. 05/08/2022: The wound dimensions are about the same, but the surface is substantially cleaner. There is just a little bit of slough accumulation on the surface. 05/18/2022: The wound is a little bit smaller, but not much. It is quite a bit cleaner, however. 05/25/2022: The wound surface continues to improve. Very little slough accumulation this week. No substantial change in the wound dimensions. 06/01/2022:  The wound surface continues to have a layer of slough on it. There is a crack in the surface near the 12 o'clock position that when further explored, revealed a cavity and tunnel that is about 2 cm deep. No purulent drainage or concern for infection. 12/11; left breast wound. The wound itself is somewhat smaller in size per our measurements. However she has a divot at 1:00 that measures about a 1.5 cm deep. We have been using iodoform packing and if it and Santyl to the rest of the wound This wound was initially had a left lumpectomy on 11/12/2021 she underwent 28 fractions of radiation. She potentially could benefit from hyperbaric oxygen 12/18; left breast wound. Better looking wound surface and improvement in measurements of the small tunnel. We have been using Santyl and iodoform. 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 Underneath the layer of slough, the tissue is much healthier-looking, with better color and some granulation tissue beginning to form. 07/13/2022: The wound continues to fill with better looking granulation tissue. There is very little slough accumulation and the cavity is shallower. We are going to submit for insurance approval for hyperbaric oxygen therapy, as it has now been 6 months since the time of her initial wound and radiation therapy. Patient History Information obtained from Patient. Social History Never smoker, Marital Status - Married, Alcohol Use - Moderate, Drug Use - No History, Caffeine Use - Daily. Medical History Hematologic/Lymphatic Patient has history of Lymphedema - Left breast-gets Lymphadema tx Hospitalization/Surgery History - 03/25/22 Left Breast cyst excision; 11/12/21 Left Breast Lumpectomy with Radioactive seeds;Left breat  Biopsy;Anterior. 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 12/08/21- 01/29/22- 37 rounds of Radiation Psychiatric General Anxiety Objective Constitutional no acute distress. Vitals Time Taken: 9:54 AM, Height: 63 in, Weight: 170 lbs, BMI: 30.1, Temperature: 98.4 F, Pulse: 86 bpm, Respiratory Rate: 18 breaths/min, Blood Pressure: 122/84 mmHg. [name], [name] ([id number]) [id number] Page 8 of 11 Respiratory Normal work of breathing on room air. General Notes: 07/13/2022: The wound continues to fill with better looking granulation tissue. There is very little slough accumulation and the cavity is shallower. Integumentary (Hair, Skin) Wound #1 status is Open. Original cause of wound was Surgical Injury. The date acquired was: 04/03/2022. The wound has been in treatment 11 [name] The wound is located on the Left Breast. The wound measures 2.4cm length x 3.5cm width x 0.1cm depth; 6.597cm^2 area and 0.66cm^3 volume. There is Fat Layer (Subcutaneous Tissue) exposed. There is no undermining noted, however, there is tunneling at 12:00 with a maximum distance of 0.5cm. There is a medium amount of serosanguineous drainage noted. The wound margin is distinct with the outline attached to the wound base. There is medium (34-66%) red granulation within the wound bed. There is a medium (34-66%) amount of necrotic tissue within the wound bed including Adherent Slough. The periwound skin appearance had no abnormalities noted for moisture. The periwound skin appearance had no abnormalities noted for color. The periwound skin appearance exhibited: Induration. Periwound temperature was noted as No Abnormality. Assessment Active Problems ICD-10 Unspecified open wound of left breast, subsequent  encounter Disruption of external operation (surgical) wound, not elsewhere classified, subsequent encounter Other specified disorders of the skin and subcutaneous tissue related to radiation Intraductal carcinoma in situ of left breast Procedures Wound #1 Pre-procedure diagnosis of Wound #1 is a Malignant Wound located on the Left Breast . There was a Selective/Open Wound Non-Viable Tissue Debridement with a total area of 8.4 sq cm performed by [name] Maudlin, MD. With the following instrument(s): Curette to remove Non-Viable tissue/material. Material removed includes [facility] after achieving pain control using Lidocaine 5% topical ointment. No specimens were taken. A time out was conducted at 10:08, prior to the start of the procedure. A Minimum amount of bleeding was controlled with Pressure. The procedure was tolerated well. Post Debridement Measurements: 2.4cm length x 3.5cm width x 0.1cm depth; 0.66cm^3 volume. Character of Wound/Ulcer Post Debridement is improved. Post procedure Diagnosis Wound #1: Same as Pre-Procedure General Notes: scribed for Dr. Celine [name] by [name] Peals, RN. Plan Follow-up Appointments: Return Appointment in 1 week. - Dr. Celine [name] Room  3 Anesthetic: (In clinic) Topical Lidocaine 5% applied to wound bed Bathing/ Shower/ Hygiene: Other Bathing/Shower/Hygiene Orders/Instructions: - Change dressing after bathing Edema Control - Lymphedema / SCD / Other: Other Edema Control Orders/Instructions: - Keep doing the Lymphadema checks Hyperbaric Oxygen Therapy: Evaluate for HBO Therapy Indication: - soft tissue radionecrosis If appropriate for treatment, begin HBOT per protocol: 2.0 ATA for 90 Minutes without Air Breaks T Number of Treatments: - 40 otal One treatments per day (delivered Monday through Friday unless otherwise specified in Special Instructions below): Finger stick Blood Glucose Pre- and Post- HBOT Treatment. Follow Hyperbaric Oxygen Glycemia  Protocol Afrin (Oxymetazoline HCL) 0.05% nasal spray - 1 spray in both nostrils daily as needed prior to HBO treatment for difficulty clearing ears ordered were: EKG - EKG for hyperbaric oxygen therapy protocol ICD10: T81.31XD Radiology ordered were: X-ray, Chest - 2 view PA and lateral of chest for hyperbaric oxygen therapy protocol ICD10: T81.31XD CPT 71046 : The following medication(s) was prescribed: lidocaine topical 5 % ointment ointment topical was prescribed at facility WOUND #1: - Breast Wound Laterality: Left Cleanser: Normal Saline (Generic) 1 x Per Day/30 Days Discharge Instructions: Cleanse the wound with Normal Saline prior to applying a clean dressing using gauze sponges, not tissue or cotton balls. Cleanser: Soap and Water 1 x Per Day/30 Days Discharge Instructions: May shower and wash wound with dial antibacterial soap and water prior to dressing change. Cleanser: Byram Ancillary Kit - 15 Day Supply (Generic) 1 x Per Day/30 Days Discharge Instructions: Use supplies as instructed; Kit contains: (15) Saline Bullets; (15) 3x3 Gauze; 15 pr Gloves Prim Dressing: Iodoform packing strip 1/2 (in) 1 x Per Day/30 Days ary Discharge Instructions: Lightly pack into tunneled area as instructed Prim Dressing: Santyl Ointment (Generic) 1 x Per Day/30 Days ary Discharge Instructions: Apply nickel thick amount to wound bed as instructed Secondary Dressing: Zetuvit Plus Silicone Border Dressing 5x5 (in/in) (Dispense As Written) 1 x Per Day/30 Days Discharge Instructions: Apply silicone border over primary dressing as directed. [name], [name] ([id number]) [id number] Page 9 of 11 07/13/2022: The wound continues to fill with better looking granulation tissue. There is very little slough accumulation and the cavity is shallower. I used a curette to debride slough from the wound. We will continue to pack the cavity with iodoform packing strips and apply Santyl to the  wound surface. 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 I propose an initial course of 40 treatments at 2.0 atm for 90 minutes with no air breaks. Follow-up in 1 week. Electronic Signature(s) Signed: 07/13/2022 10:20:23 AM By: [name] Maudlin MD FACS Entered By: [name] on 07/13/2022 10:20:23 -------------------------------------------------------------------------------- HxROS Details Patient Name: Date of Service: [name], Blue [name] 07/13/2022 10:00 A M Medical Record Number: [id number] Patient Account Number: [id number] Date of Birth/Sex: Treating RN: January 18, 1971 (52 y.o. F) Primary Care Provider: Early [name] Other Clinician: Referring Provider: Treating Provider/Extender: [name] in Treatment: 11 Information Obtained From Patient Ear/Nose/Mouth/Throat Medical History: Past Medical History Notes: Left ear limited hearing Hematologic/Lymphatic Medical History: Positive for: Lymphedema - Left breast-gets Lymphadema tx Gastrointestinal Medical History: Past Medical History Notes: GERD Immunological Medical History: Past Medical History Notes: Wegener"s Granulomatosis Musculoskeletal Medical History: Past Medical History Notes: "RA like arthritis" as per patient Oncologic Medical History: Past Medical History Notes: Left Breast. 12/08/21- 01/29/22- 37 rounds of Radiation Psychiatric Medical History: Past Medical History Notes: General Anxiety Immunizations Macari, [name] ([id number]) [id number] Page 10 of 11 Pneumococcal Vaccine: Received Pneumococcal Vaccination: Yes Received Pneumococcal Vaccination On or After 60th Birthday: No Implantable Devices Yes Hospitalization / Surgery History Type of Hospitalization/Surgery 03/25/22 Left Breast cyst excision; 11/12/21 Left Breast Lumpectomy with Radioactive seeds;Left  breat Biopsy;Anterior Family and Social History Never smoker; Marital Status - Married; Alcohol Use: Moderate; Drug Use: No History; Caffeine Use: Daily; Financial Concerns: No; Food, Clothing or Shelter Needs: No; Support System Lacking: No; Transportation Concerns: No Electronic Signature(s) Signed: 07/13/2022 11:45:20 AM By: [name] Maudlin MD FACS Entered By: [name] on 07/13/2022 10:17:49 -------------------------------------------------------------------------------- SuperBill Details Patient Name: Date of Service: [name] Mattes. 07/13/2022 Medical Record Number: [id number] Patient Account Number: [id number] Date of Birth/Sex: Treating RN: 11/26/1970 (52 y.o. 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 Facility Procedures : CPT4 Code: [id number] Description: 67341 - DEBRIDE WOUND 1ST 20 SQ CM OR < ICD-10 Diagnosis Description S21.002D Unspecified open wound of left breast, subsequent encounter L59.8 Other specified disorders of the skin and subcutaneous tissue related to radiatio T81.31XD  Disruption of external operation (surgical) wound, not elsewhere classified, subs 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 Modifier: n equent encounter Quantity: 1 Physician Procedures : CPT4 Code Description Modifier [id number] - WC PHYS LEVEL 4 - EST PT 25 ICD-10 Diagnosis Description S21.002D Unspecified open wound of left breast, subsequent encounter L59.8 Other specified disorders of the skin  and subcutaneous tissue related to  radiation T81.31XD Disruption of external operation (surgical) wound, not elsewhere classified, subsequent encounter 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 Quantity: 1 : [id number] - WC PHYS DEBR WO ANESTH 20 SQ CM ICD-10 Diagnosis Description S21.002D Unspecified open wound of left breast, subsequent encounter 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 T81.31XD  Disruption of external operation (surgical) wound, not elsewhere classified, subsequent encounter 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

## 2022-07-13 NOTE — Progress Notes (Signed)
[name], [name] ([id number]) [phone].pdf Page 1 of 7 Visit Report for 07/13/2022 Arrival Information Details Patient Name: Date of Service: [name], [name] 07/13/2022 10:00 A M Medical Record Number: [id number] Patient Account Number: [id number] Date of Birth/Sex: Treating RN: 19-Jan-1971 (52 y.o. [name] Daliah Chaudoin: [name] Other Clinician: Referring Aleathea Pugmire: Treating Demarko Zeimet/Extender: Shea Stakes in Treatment: 11 Visit Information History Since Last Visit Added or deleted any medications: No Patient Arrived: Ambulatory Any new allergies or adverse reactions: No Arrival Time: 09:51 Had a fall or experienced change in No Accompanied By: self activities of daily living that may affect Transfer Assistance: None risk of falls: Patient Identification Verified: Yes Signs or symptoms of abuse/neglect since last visito No Secondary Verification Process Completed: Yes Hospitalized since last visit: No Patient Requires Transmission-Based Precautions: No Implantable device outside of the clinic excluding No Patient Has Alerts: No cellular tissue based products placed in the center since last visit: Has Dressing in Place as Prescribed: Yes Pain Present Now: No Electronic Signature(s) Signed: 07/13/2022 4:13:50 PM By: [name] Entered By: [name] on 07/13/2022 09:54:29 -------------------------------------------------------------------------------- Encounter Discharge Information Details Patient Name: Date of Service: [name], [name] 07/13/2022 10:00 A M Medical Record Number: [id number] Patient Account Number: [id number] Date of Birth/Sex: Treating RN: [birth year] (52 y.o. [name] Latina Frank: [name] Other Clinician: Referring Richmond Coldren: Treating Terrianna Holsclaw/Extender: Shea Stakes in Treatment: 28 Encounter Discharge  Information Items Post Procedure Vitals Discharge Condition: Stable Temperature (F): 98.4 Ambulatory Status: Ambulatory Pulse (bpm): 86 Discharge Destination: Home Respiratory Rate (breaths/min): 18 Transportation: Private Auto Blood Pressure (mmHg): 122/84 Accompanied By: self Schedule Follow-up Appointment: Yes Clinical Summary of Care: Patient Declined Electronic Signature(s) Signed: 07/13/2022 4:13:50 PM By: [name] Entered By: [name] on 07/13/2022 10:25:31 [name] ([id number]) [id number] Page 2 of 7 -------------------------------------------------------------------------------- Lower Extremity Assessment Details Patient Name: Date of Service: [name] [name] 07/13/2022 10:00 A M Medical Record Number: [id number] Patient Account Number: [id number] Date of Birth/Sex: Treating RN: [birth year] (52 y.o. [name] Ladarrion Telfair: [name] Other Clinician: Referring Elisandra Deshmukh: Treating Shawnya Mayor/Extender: [name], [name] in Treatment: 11 Electronic Signature(s) Signed: 07/13/2022 4:13:50 PM By: Sabas Sous By: [name] on 07/13/2022 09:54:55 -------------------------------------------------------------------------------- Multi Wound Chart Details Patient Name: Date of Service: [name], [name] 07/13/2022 10:00 A M Medical Record Number: [id number] Patient Account Number: [id number] Date of Birth/Sex: Treating RN: 02-19-1971 (52 y.o. F) Primary Care Renleigh Ouellet: [name] Other Clinician: Referring Annisten Manchester: Treating [name]/Extender: [name], [name] in Treatment: 11 Vital Signs Height(in): 63 Pulse(bpm): 86 Weight(lbs): 170 Blood Pressure(mmHg): 122/84 Body Mass Index(BMI): 30.1 Temperature(F): 98.4 Respiratory Rate(breaths/min): 18 [1:Photos:] [N/A:N/A] Left Breast N/A N/A Wound Location: Surgical Injury N/A  N/A Wounding Event: Malignant Wound N/A N/A Primary Etiology: Lymphedema N/A N/A Comorbid History: 04/03/2022 N/A N/A Date Acquired: 11 N/A N/A [name] of Treatment: Open N/A N/A Wound Status: No N/A N/A Wound Recurrence: 2.4x3.5x0.1 N/A N/A Measurements L x W x D (cm) 6.597 N/A N/A A (cm) : rea 0.66 N/A N/A Volume (cm) : 50.50% N/A N/A % Reduction in A rea: 87.60% N/A N/A % Reduction in Volume: 12 Position 1 (o'clock): 0.5 Maximum Distance 1 (cm): Yes N/A N/A Tunneling: Full Thickness Without Exposed N/A N/A Classification: Support Structures Medium N/A N/A Exudate Amount: Serosanguineous N/A N/A Exudate Type: red, brown N/A N/A Exudate Color: Distinct, outline attached N/A N/A  Wound Margin: GESSICA, [name] ([id number]) [phone].pdf Page 3 of 7 Medium (34-66%) N/A N/A Granulation Amount: Red N/A N/A Granulation Quality: Medium (34-66%) N/A N/A Necrotic Amount: Fat Layer (Subcutaneous Tissue): Yes N/A N/A Exposed Structures: Fascia: No Tendon: No Muscle: No Joint: No Bone: No Small (1-33%) N/A N/A Epithelialization: Debridement - Selective/Open Wound N/A N/A Debridement: Pre-procedure Verification/Time Out 10:08 N/A N/A Taken: Lidocaine 5% topical ointment N/A N/A Pain Control: Slough N/A N/A Tissue Debrided: Non-Viable Tissue N/A N/A Level: 8.4 N/A N/A Debridement A (sq cm): rea Curette N/A N/A Instrument: Minimum N/A N/A Bleeding: Pressure N/A N/A Hemostasis A chieved: Procedure was tolerated well N/A N/A Debridement Treatment Response: 2.4x3.5x0.1 N/A N/A Post Debridement Measurements L x W x D (cm) 0.66 N/A N/A Post Debridement Volume: (cm) Induration: Yes N/A N/A Periwound Skin Texture: No Abnormalities Noted N/A N/A Periwound Skin Moisture: No Abnormalities Noted N/A N/A Periwound Skin Color: No Abnormality N/A N/A Temperature: Debridement N/A N/A Procedures Performed: Treatment  Notes Electronic Signature(s) Signed: 07/13/2022 10:15:59 AM By: [name] MD FACS Entered By: [name] on 07/13/2022 10:15:58 -------------------------------------------------------------------------------- Multi-Disciplinary Care Plan Details Patient Name: Date of Service: [name], [name] 07/13/2022 10:00 A M Medical Record Number: [id number] Patient Account Number: [id number] Date of Birth/Sex: Treating RN: 17-Jul-1970 (52 y.o. [name] Nairi Oswald: [name] Other Clinician: Referring Raeshaun Simson: Treating Jacie Tristan/Extender: Shea Stakes in Treatment: [place] reviewed with physician Active Inactive Wound/Skin Impairment Nursing Diagnoses: Impaired tissue integrity Goals: Patient/caregiver will verbalize understanding of skin care regimen Date Initiated: 04/24/2022 Target Resolution Date: 08/07/2022 Goal Status: Active Interventions: Assess ulceration(s) every visit Treatment Activities: Skin care regimen initiated : 04/24/2022 Notes: Electronic Signature(s) EMALIA, WITKOP ([id number]) [phone].pdf Page 4 of 7 Signed: 07/13/2022 4:13:50 PM By: Sabas Sous By: [name] on 07/13/2022 10:24:41 -------------------------------------------------------------------------------- Pain Assessment Details Patient Name: Date of Service: [name]. 07/13/2022 10:00 A M Medical Record Number: [id number] Patient Account Number: [id number] Date of Birth/Sex: Treating RN: 1970-08-11 (52 y.o. [name] Jamine Highfill: [name] Other Clinician: Referring Tanielle Emigh: Treating Kayleeann Huxford/Extender: [name], [name] in Treatment: 11 Active Problems Location of Pain Severity and Description of Pain Patient Has Paino No Site Locations Rate the pain. Current Pain Level: 0 Pain Management and  Medication Current Pain Management: Electronic Signature(s) Signed: 07/13/2022 4:13:50 PM By: [name] Entered By: [name] on 07/13/2022 09:54:53 -------------------------------------------------------------------------------- Patient/Caregiver Education Details Patient Name: Date of Service: [name], STEPHA NIE W. 1/8/2024andnbsp10:00 A M Medical Record Number: [id number] Patient Account Number: [id number] Date of Birth/Gender: Treating RN: [birth year] (52 y.o. [name] Physician: [name] Other Clinician: Referring Physician: Treating Physician/Extender: Shea Stakes in Treatment: 11 Education Assessment Education Provided To: Patient ALMEDIA, CORDELL ([id number]) [id number] Page 5 of 7 Education Topics Provided Hyperbaric Oxygenation: Methods: Explain/Verbal Responses: Reinforcements needed, State content correctly Electronic Signature(s) Signed: 07/13/2022 4:13:50 PM By: [name] Entered By: [name] on 07/13/2022 10:25:00 -------------------------------------------------------------------------------- Wound Assessment Details Patient Name: Date of Service: [name]. 07/13/2022 10:00 A M Medical Record Number: [id number] Patient Account Number: [id number] Date of Birth/Sex: Treating RN: 1970/10/15 (52 y.o. [name] Titania Gault: [name] Other Clinician: Referring Lincy Belles: Treating Seleni Meller/Extender: [name], [name] in Treatment: 11 Wound Status Wound Number: 1 Primary Etiology: Malignant Wound Wound Location: Left Breast Wound Status: Open Wounding Event: Surgical Injury Comorbid History: Lymphedema Date  Acquired: 04/03/2022 [name] Of Treatment: 11 Clustered Wound: No Photos Wound Measurements Length: (cm) 2.4 Width: (cm) 3.5 Depth: (cm) 0.1 Area: (cm) 6.597 Volume: (cm) 0.66 %  Reduction in Area: 50.5% % Reduction in Volume: 87.6% Epithelialization: Small (1-33%) Tunneling: Yes Position (o'clock): 12 Maximum Distance: (cm) 0.5 Undermining: No Wound Description Classification: Full Thickness Without Exposed Suppor Wound Margin: Distinct, outline attached Exudate Amount: Medium Exudate Type: Serosanguineous Exudate Color: red, brown t Structures Foul Odor After Cleansing: No Slough/Fibrino Yes Wound Bed Granulation Amount: Medium (34-66%) Exposed Structure Granulation Quality: Red Fascia Exposed: No Necrotic Amount: Medium (34-66%) Fat Layer (Subcutaneous Tissue) Exposed: Yes Necrotic Quality: Adherent Slough Tendon Exposed: No Muscle Exposed: No Joint Exposed: No TAMEKIA, ROTTER ([id number]) [id number] Page 6 of 7 Bone Exposed: No Periwound Skin Texture Texture Color No Abnormalities Noted: No No Abnormalities Noted: Yes Induration: Yes Temperature / Pain Temperature: No Abnormality Moisture No Abnormalities Noted: Yes Treatment Notes Wound #1 (Breast) Wound Laterality: Left Cleanser Normal Saline Discharge Instruction: Cleanse the wound with Normal Saline prior to applying a clean dressing using gauze sponges, not tissue or cotton balls. Soap and Water Discharge Instruction: May shower and wash wound with dial antibacterial soap and water prior to dressing change. Byram Ancillary Kit - 15 Day Supply Discharge Instruction: Use supplies as instructed; Kit contains: (15) Saline Bullets; (15) 3x3 Gauze; 15 pr Gloves Peri-Wound Care Topical Primary Dressing Iodoform packing strip 1/2 (in) Discharge Instruction: Lightly pack into tunneled area as instructed Santyl Ointment Discharge Instruction: Apply nickel thick amount to wound bed as instructed Secondary Dressing Zetuvit Plus Silicone Border Dressing 5x5 (in/in) Discharge Instruction: Apply silicone border over primary dressing as directed. Secured  With Compression Wrap Compression Stockings Environmental education officer) Signed: 07/13/2022 4:13:50 PM By: [name] Entered By: [name] on 07/13/2022 09:59:01 -------------------------------------------------------------------------------- Vitals Details Patient Name: Date of Service: [name], [place] W. 07/13/2022 10:00 A M Medical Record Number: [id number] Patient Account Number: [id number] Date of Birth/Sex: Treating RN: 07/14/1970 (52 y.o. [name] Donevan Biller: [name] Other Clinician: Referring Daiden Coltrane: Treating Harveer Sadler/Extender: [name], [name] in Treatment: 11 Vital Signs Time Taken: 09:54 Temperature (F): 98.4 Height (in): 63 Pulse (bpm): 86 Weight (lbs): 170 Respiratory Rate (breaths/min): 18 Body Mass Index (BMI): 30.1 Blood Pressure (mmHg): 122/84 Reference Range: 80 - 120 mg / dl Electronic Signature(s) Signed: 07/13/2022 4:13:50 PM By: Ignacia Palma ([id number]) BySela Hilding.pdf Page 7 of 7 Signed: 07/13/2022 4:13:50 PM aylor Entered By: [name] on 07/13/2022 09:54:47

## 2022-07-14 ENCOUNTER — Ambulatory Visit (HOSPITAL_BASED_OUTPATIENT_CLINIC_OR_DEPARTMENT_OTHER)
Admission: RE | Admit: 2022-07-14 | Discharge: 2022-07-14 | Disposition: A | Discharge status: Home / Self Care | Payer: Commercial Managed Care - PPO | Source: Ambulatory Visit | Attending: General Surgery | Admitting: General Surgery

## 2022-07-14 ENCOUNTER — Other Ambulatory Visit (HOSPITAL_BASED_OUTPATIENT_CLINIC_OR_DEPARTMENT_OTHER): Payer: Self-pay | Admitting: General Surgery

## 2022-07-14 ENCOUNTER — Other Ambulatory Visit (HOSPITAL_BASED_OUTPATIENT_CLINIC_OR_DEPARTMENT_OTHER): Payer: Self-pay

## 2022-07-14 DIAGNOSIS — T8131XA Disruption of external operation (surgical) wound, not elsewhere classified, initial encounter: Secondary | ICD-10-CM

## 2022-07-14 DIAGNOSIS — K449 Diaphragmatic hernia without obstruction or gangrene: Secondary | ICD-10-CM | POA: Diagnosis not present

## 2022-07-15 DIAGNOSIS — D0512 Intraductal carcinoma in situ of left breast: Secondary | ICD-10-CM | POA: Diagnosis not present

## 2022-07-15 DIAGNOSIS — T8131XD Disruption of external operation (surgical) wound, not elsewhere classified, subsequent encounter: Secondary | ICD-10-CM | POA: Diagnosis not present

## 2022-07-15 DIAGNOSIS — Z01810 Encounter for preprocedural cardiovascular examination: Secondary | ICD-10-CM | POA: Diagnosis not present

## 2022-07-20 ENCOUNTER — Encounter (HOSPITAL_BASED_OUTPATIENT_CLINIC_OR_DEPARTMENT_OTHER): Payer: 59 | Admitting: General Surgery

## 2022-07-20 DIAGNOSIS — T8131XA Disruption of external operation (surgical) wound, not elsewhere classified, initial encounter: Secondary | ICD-10-CM | POA: Diagnosis not present

## 2022-07-20 DIAGNOSIS — C50912 Malignant neoplasm of unspecified site of left female breast: Secondary | ICD-10-CM | POA: Diagnosis not present

## 2022-07-21 NOTE — Progress Notes (Signed)
[name] ([id number]) [id number] Page 1 of 9 Visit Report for 07/20/2022 Chief Complaint Document Details Patient Name: Date of Service: [name] 07/20/2022 10:45 A M Medical Record Number: [id number] Patient Account Number: [id number] Date of Birth/Sex: Treating RN: Nov 12, 1970 (52 y.o. F) Primary Care Provider: [name] Other Clinician: Referring Provider: Treating Provider/Extender: [name], [name] Weeks in Treatment: 12 Information Obtained from: Patient Chief Complaint 04/24/2022; patient is here for a surgical wound on her left lateral breast Electronic Signature(s) Signed: 07/20/2022 11:24:07 AM By: [name] MD FACS Entered By: [name] on 07/20/2022 11:24:07 -------------------------------------------------------------------------------- Debridement Details Patient Name: Date of Service: Unk [name] NIE W. 07/20/2022 10:45 A M Medical Record Number: [id number] Patient Account Number: [id number] Date of Birth/Sex: Treating RN: August 05, 1970 (52 y.o. [name] Primary Care Provider: [name] Other Clinician: Referring Provider: Treating Provider/Extender: Shea Stakes in Treatment: 12 Debridement Performed for Assessment: Wound #1 Left Breast Performed By: Physician [name], MD Debridement Type: Debridement Level of Consciousness (Pre-procedure): Awake and Alert Pre-procedure Verification/Time Out Yes - 11:08 Taken: Start Time: 11:09 Pain Control: Lidocaine 5% topical ointment T Area Debrided (L x W): otal 2.3 (cm) x 3 (cm) = 6.9 (cm) Tissue and other material debrided: Non-Viable, Slough, Slough Level: Non-Viable Tissue Debridement Description: Selective/Open Wound Instrument: Curette Bleeding: Minimum Hemostasis Achieved: Pressure Response to Treatment: Procedure was tolerated well Level of Consciousness (Post- Awake and  Alert procedure): Post Debridement Measurements of Total Wound Length: (cm) 2.3 Width: (cm) 3 Depth: (cm) 0.1 Volume: (cm) 0.542 Character of Wound/Ulcer Post Debridement: Improved Post Procedure Diagnosis Same as [name] ([id number]) [id number] Page 2 of 9 Notes Scribed for Dr. Celine Ahr by Blanche East, RN Electronic Signature(s) Signed: 07/20/2022 12:17:58 PM By: [name] MD FACS Signed: 07/21/2022 3:04:08 PM By: Blanche East RN Entered By: Blanche East on 07/20/2022 11:09:51 -------------------------------------------------------------------------------- HPI Details Patient Name: Date of Service: [name], [name] NIE W. 07/20/2022 10:45 A M Medical Record Number: [id number] Patient Account Number: [id number] Date of Birth/Sex: Treating RN: 06-Aug-1970 (52 y.o. F) Primary Care Provider: [name] Other Clinician: Referring Provider: Treating Provider/Extender: [name], [name] Weeks in Treatment: 12 History of Present Illness HPI Description: ADMISSION 05/25/2022 This is a 52 year old woman who is found to have an abnormal mammogram of her left breast earlier this year showing a 13 mm group of calcifications in the upper quadrant of the left breast. 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 She underwent a left lumpectomy on 11/12/2021. The patient states the wound never really healed and was open. She underwent radiation therapy from 12/10/2021 through 01/19/22 28 fractions of 1.8GY. Essentially the wound would not heal. She was treated several times with antibiotics finally on 03/25/2022 she was taken to the OR by Dr. Barry Dienes for excision of the wound in a sinus. Operative cultures grew strep and Prevotella and a culture from the clinic Livedo MRSA. It was recommended for 4 weeks of Augmentin and doxycycline by infectious disease. She  was seen by wound care and she has been treating the wound with Medihoney ever since. 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 She was on meth methotrexate 20 mg once  a week however this I think was put on hold because of the wound followed by Dr. Posey Pronto of rheumatology. She has asthma. Most recent breast ultrasound was on 9/7 She has a large nonhealing wound on the lateral aspect of her left breast 04/30/2022: Although I do not have a photograph to compare to last week, the RN report is that it is substantially cleaner. She still has ample slough and nonviable fat and subcutaneous tissue present. She only was able to get her Santyl on Monday so she has just had a couple of days of treatment. 05/08/2022: The wound dimensions are about the same, but the surface is substantially cleaner. There is just a little bit of slough accumulation on the surface. 05/18/2022: The wound is a little bit smaller, but not much. It is quite a bit cleaner, however. 05/25/2022: The wound surface continues to improve. Very little slough accumulation this week. No substantial change in the wound dimensions. 06/01/2022: The wound surface continues to have a layer of slough on it. There is a crack in the surface near the 12 o'clock position that when further explored, revealed a cavity and tunnel that is about 2 cm deep. No purulent drainage or concern for infection. 12/11; left breast wound. The wound itself is somewhat smaller in size per our measurements. However she has a divot at 1:00 that measures about a 1.5 cm deep. We have been using iodoform packing and if it and Santyl to the rest of the wound This wound was initially had a left lumpectomy on 11/12/2021 she underwent 28  fractions of radiation. She potentially could benefit from hyperbaric oxygen 12/18; left breast wound. Better looking wound surface and improvement in measurements of the small tunnel. We have been using Santyl and iodoform. 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 Underneath the layer of slough, the tissue is much healthier-looking, with better color and some granulation tissue beginning to form. 07/13/2022: The wound continues to fill with better looking granulation tissue. There is very little slough accumulation and the cavity is shallower. We are going to submit for insurance approval for hyperbaric oxygen therapy, as it has now been 6 months since the time of her initial wound and radiation therapy. 07/20/2022: The cavity continues to fill in and the surface of the wound is improving. EKG and chest x-ray are complete and we are just awaiting insurance approval for hyperbarics. Electronic Signature(s) Signed: 07/20/2022 11:24:49 AM By: [name] MD FACS Entered By: [name] on 07/20/2022 11:24:49 [name] ([id number]) [id number] Page 3 of 9 -------------------------------------------------------------------------------- Physical Exam Details Patient Name: Date of Service: [name] 07/20/2022 10:45 A M Medical Record Number: [id number] Patient Account Number: [id number] Date of Birth/Sex: Treating RN: [birth year] (52 y.o. F) Primary Care Provider: [name] Other Clinician: Referring Provider: Treating Provider/Extender: [name], [name] Weeks in Treatment: 12 Constitutional . . . . no acute distress. Respiratory Normal work of breathing on room air. Notes 07/20/2022: The cavity continues to fill in  and the surface of the wound is improving. Electronic Signature(s) Signed: 07/20/2022 11:25:14 AM By: [name] MD FACS Entered By: [name] on 07/20/2022 11:25:14 -------------------------------------------------------------------------------- Physician Orders Details Patient Name: Date of Service: [name], [name] NIE W. 07/20/2022 10:45 A M Medical Record Number:  [id number] Patient Account Number: [id number] Date of Birth/Sex: Treating RN: October 26, 1970 (52 y.o. [name] Primary Care Provider: [name] Other Clinician: Referring Provider: Treating Provider/Extender: Shea Stakes in Treatment: 93 Verbal / Phone Orders: No Diagnosis Coding ICD-10 Coding Code Description S21.002D Unspecified open wound of left breast, subsequent encounter T81.31XD Disruption of external operation (surgical) wound, not elsewhere classified, subsequent encounter 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 Follow-up Appointments ppointment in 1 week. - Dr. Celine Ahr Room 3 Return A Anesthetic (In clinic) Topical Lidocaine 5% applied to wound bed Bathing/ Shower/ Hygiene Other Bathing/Shower/Hygiene Orders/Instructions: - Change dressing after bathing Edema Control - Lymphedema / SCD / Other Other Edema Control Orders/Instructions: - Keep doing the Lymphadema checks Hyperbaric Oxygen Therapy Evaluate for HBO Therapy Indication: - soft tissue radionecrosis If appropriate for treatment, begin HBOT per protocol: 2.0 ATA for 90 Minutes without A Breaks ir Total Number of Treatments: - 40 One treatments per day (delivered Monday through Friday unless otherwise specified in Special Instructions below): [name] ([id number]) [id number] Page 4 of 9 Finger stick Blood Glucose Pre- and Post- HBOT Treatment. Follow Hyperbaric Oxygen  Glycemia Protocol Afrin (Oxymetazoline HCL) 0.05% nasal spray - 1 spray in both nostrils daily as needed prior to HBO treatment for difficulty clearing ears Wound Treatment Wound #1 - Breast Wound Laterality: Left Cleanser: Normal Saline (Generic) 1 x Per Day/30 Days Discharge Instructions: Cleanse the wound with Normal Saline prior to applying a clean dressing using gauze sponges, not tissue or cotton balls. Cleanser: Soap and Water 1 x Per Day/30 Days Discharge Instructions: May shower and wash wound with dial antibacterial soap and water prior to dressing change. Cleanser: Byram Ancillary Kit - 15 Day Supply (Generic) 1 x Per Day/30 Days Discharge Instructions: Use supplies as instructed; Kit contains: (15) Saline Bullets; (15) 3x3 Gauze; 15 pr Gloves Prim Dressing: Iodoform packing strip 1/2 (in) 1 x Per Day/30 Days ary Discharge Instructions: Lightly pack into tunneled area as instructed Prim Dressing: Santyl Ointment (Generic) 1 x Per Day/30 Days ary Discharge Instructions: Apply nickel thick amount to wound bed as instructed Secondary Dressing: Zetuvit Plus Silicone Border Dressing 5x5 (in/in) (Dispense As Written) 1 x Per Day/30 Days Discharge Instructions: Apply silicone border over primary dressing as directed. GLYCEMIA INTERVENTIONS PROTOCOL PRE-HBO GLYCEMIA INTERVENTIONS ACTION INTERVENTION Obtain pre-HBO capillary blood glucose (ensure 1 physician order is in chart). A. Notify HBO physician and await physician orders. 2 If result is 70 mg/dl or below: B. If the result meets the hospital definition of a critical result, follow hospital policy. A. Give patient an 8 ounce Glucerna Shake, an 8 ounce Ensure, or 8 ounces of a Glucerna/Ensure equivalent dietary supplement*. B. Wait 30 minutes. If result is 71 mg/dl to 130 mg/dl: C. Retest patients capillary blood glucose (CBG). D. If result greater than or equal to 110 mg/dl, proceed with HBO. If result less than 110  mg/dl, notify HBO physician and consider holding HBO. If result is 131 mg/dl to 249 mg/dl: A. Proceed with HBO. A. Notify HBO physician and await physician orders. B. It is recommended to hold HBO and do If result is 250 mg/dl or greater: blood/urine ketone testing. C. If the result meets the hospital definition of a critical result, follow hospital policy. POST-HBO GLYCEMIA INTERVENTIONS ACTION INTERVENTION Obtain post HBO capillary blood glucose (ensure 1 physician order is in chart). A. Notify HBO physician and  await physician orders. 2 If result is 70 mg/dl or below: B. If the result meets the hospital definition of a critical result, follow hospital policy. A. Give patient an 8 ounce Glucerna Shake, an 8 ounce Ensure, or 8 ounces of a Glucerna/Ensure equivalent dietary supplement*. B. Wait 15 minutes for symptoms of If result is 71 mg/dl to 100 mg/dl: hypoglycemia (i.e. nervousness, anxiety, sweating, chills, clamminess, irritability, confusion, tachycardia or dizziness). C. If patient asymptomatic, discharge patient. If patient symptomatic, repeat capillary blood glucose (CBG) and notify HBO physician. If result is 101 mg/dl to 249 mg/dl: A. Discharge patient. A. Notify HBO physician and await physician orders. B. It is recommended to do blood/urine ketone If result is 250 mg/dl or greater: testing. C. If the result meets the hospital definition of a critical result, follow hospital policy. *Juice or candies are NOT equivalent products. If patient refuses the Glucerna or Ensure, please consult the hospital dietitian for an appropriate substitute. Electronic Signature(s) Signed: 07/20/2022 12:17:58 PM By: [name] MD FACS [name] ([id number]) By: [name] MD FACS [phone].pdf Page 5 of 9 Signed: 07/20/2022 12:17:58 PM Entered By: [name] on 07/20/2022  11:25:35 -------------------------------------------------------------------------------- Problem List Details Patient Name: Date of Service: [name] Mattes. 07/20/2022 10:45 A M Medical Record Number: [id number] Patient Account Number: [id number] Date of Birth/Sex: Treating RN: May 21, 1971 (52 y.o. F) Primary Care Provider: [name] Other Clinician: Referring Provider: Treating Provider/Extender: [name], [name] Weeks in Treatment: 12 Active Problems ICD-10 Encounter Code Description Active Date MDM Diagnosis S21.002D Unspecified open wound of left breast, subsequent encounter 04/24/2022 No Yes T81.31XD Disruption of external operation (surgical) wound, not elsewhere classified, 04/24/2022 No Yes subsequent encounter L59.8 Other specified disorders of the skin and subcutaneous tissue related to 04/24/2022 No Yes radiation 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 04/24/2022 No Yes Inactive Problems Resolved Problems Electronic Signature(s) Signed: 07/20/2022 11:23:56 AM By: [name] MD FACS Entered By: [name] on 07/20/2022 11:23:56 -------------------------------------------------------------------------------- Progress Note Details Patient Name: Date of Service: Unk [name] NIE W. 07/20/2022 10:45 A M Medical Record Number: [id number] Patient Account Number: [id number] Date of Birth/Sex: Treating RN: 1970/09/14 (52 y.o. F) Primary Care Provider: [name] Other Clinician: Referring Provider: Treating Provider/Extender: [name], [name] Weeks in Treatment: 12 Subjective Chief Complaint Information obtained from Patient 04/24/2022; patient is here for a surgical wound on her left lateral breast [name] ([id number]) [id number] Page 6 of 9 History of Present Illness (HPI) ADMISSION 05/25/2022 This is a 52 year old woman who is found to have an abnormal mammogram  of her left breast earlier this year showing a 13 mm group of calcifications in the upper quadrant of the left breast. 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 She underwent a left lumpectomy on 11/12/2021. The patient states the wound never really healed and was open. She underwent radiation therapy from 12/10/2021 through 01/19/22 28 fractions of 1.8GY. Essentially the wound would not heal. She was treated several times with antibiotics finally on 03/25/2022 she was taken to the OR by Dr. Barry Dienes for excision of the wound in a sinus. Operative cultures grew strep and Prevotella and a culture from the clinic [place] MRSA. It was recommended for 4 weeks of Augmentin and doxycycline by infectious disease. She was seen by wound care and she has been treating the wound with Medihoney ever since. 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 She was on meth methotrexate 20 mg once a week however this I think was put on hold because of the wound followed by Dr. Posey Pronto of rheumatology. She has asthma. Most recent breast ultrasound was on 9/7 She has a large nonhealing wound on the lateral aspect of her left breast 04/30/2022: Although I do not have a photograph to compare to last week, the RN report is that it is substantially cleaner. She still has ample slough and nonviable fat and subcutaneous tissue present. She only was able to get her Santyl on Monday so she has just had a couple of days of treatment. 05/08/2022: The wound dimensions are about the same, but the surface is substantially cleaner. There is just a little bit of slough accumulation on the surface. 05/18/2022: The wound is a little bit  smaller, but not much. It is quite a bit cleaner, however. 05/25/2022: The wound surface continues to improve. Very little slough accumulation this week. No substantial change in the wound dimensions. 06/01/2022: The wound surface continues to have a layer of slough on it. There is a crack in the surface near the 12 o'clock position that when further explored, revealed a cavity and tunnel that is about 2 cm deep. No purulent drainage or concern for infection. 12/11; left breast wound. The wound itself is somewhat smaller in size per our measurements. However she has a divot at 1:00 that measures about a 1.5 cm deep. We have been using iodoform packing and if it and Santyl to the rest of the wound This wound was initially had a left lumpectomy on 11/12/2021 she underwent 28 fractions of radiation. She potentially could benefit from hyperbaric oxygen 12/18; left breast wound. Better looking wound surface and improvement in measurements of the small tunnel. We have been using Santyl and iodoform. 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 Underneath the layer of slough, the tissue is much healthier-looking, with better color and some granulation tissue beginning to form. 07/13/2022: The wound continues to fill with better looking granulation tissue. There is very little slough accumulation and the cavity is shallower. We are going to submit for insurance approval for hyperbaric oxygen therapy, as it has now been 6 months since the time of her initial wound and radiation therapy. 07/20/2022: The cavity continues to fill in and the surface of the wound is improving. EKG and chest x-ray are complete and we are just awaiting insurance approval for hyperbarics. Patient History Information obtained from  Patient. Social History Never smoker, Marital Status - Married, Alcohol Use - Moderate, Drug Use - No History, Caffeine Use - Daily. Medical History Hematologic/Lymphatic Patient has history of Lymphedema - Left breast-gets Lymphadema tx Hospitalization/Surgery History - 03/25/22 Left Breast cyst excision; 11/12/21 Left Breast Lumpectomy with Radioactive seeds;Left breat Biopsy;Anterior. 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 12/08/21- 01/29/22- 37 rounds of Radiation Psychiatric General Anxiety Objective Constitutional no acute distress. Vitals Time Taken: 10:56 AM, Height: 63 in, Weight: 170  lbs, BMI: 30.1, Temperature: 98.3 F, Pulse: 85 bpm, Respiratory Rate: 18 breaths/min, Blood [name] ([id number]) [id number] Page 7 of 9 Pressure: 117/86 mmHg. Respiratory Normal work of breathing on room air. General Notes: 07/20/2022: The cavity continues to fill in and the surface of the wound is improving. Integumentary (Hair, Skin) Wound #1 status is Open. Original cause of wound was Surgical Injury. The date acquired was: 04/03/2022. The wound has been in treatment 12 weeks. The wound is located on the Left Breast. The wound measures 2.3cm length x 3cm width x 0.1cm depth; 5.419cm^2 area and 0.542cm^3 volume. There is Fat Layer (Subcutaneous Tissue) exposed. There is no undermining noted, however, there is tunneling at 1:00 with a maximum distance of 0.4cm. There is a medium amount of serosanguineous drainage noted. The wound margin is distinct with the outline attached to the wound base. There is medium (34-66%) red granulation within the wound bed. There is a medium (34-66%) amount of necrotic tissue within the wound bed including Adherent Slough. The periwound skin  appearance had no abnormalities noted for moisture. The periwound skin appearance had no abnormalities noted for color. The periwound skin appearance exhibited: Induration. Periwound temperature was noted as No Abnormality. Assessment Active Problems ICD-10 Unspecified open wound of left breast, subsequent encounter Disruption of external operation (surgical) wound, not elsewhere classified, subsequent encounter Other specified disorders of the skin and subcutaneous tissue related to radiation Intraductal carcinoma in situ of left breast Procedures Wound #1 Pre-procedure diagnosis of Wound #1 is a Malignant Wound located on the Left Breast . There was a Selective/Open Wound Non-Viable Tissue Debridement with a total area of 6.9 sq cm performed by [name], MD. With the following instrument(s): Curette to remove Non-Viable tissue/material. Material removed includes [facility] after achieving pain control using Lidocaine 5% topical ointment. No specimens were taken. A time out was conducted at 11:08, prior to the start of the procedure. A Minimum amount of bleeding was controlled with Pressure. The procedure was tolerated well. Post Debridement Measurements: 2.3cm length x 3cm width x 0.1cm depth; 0.542cm^3 volume. Character of Wound/Ulcer Post Debridement is improved. Post procedure Diagnosis Wound #1: Same as Pre-Procedure General Notes: Scribed for Dr. Celine Ahr by Blanche East, RN. Plan Follow-up Appointments: Return Appointment in 1 week. - Dr. Celine Ahr Room 3 Anesthetic: (In clinic) Topical Lidocaine 5% applied to wound bed Bathing/ Shower/ Hygiene: Other Bathing/Shower/Hygiene Orders/Instructions: - Change dressing after bathing Edema Control - Lymphedema / SCD / Other: Other Edema Control Orders/Instructions: - Keep doing the Lymphadema checks Hyperbaric Oxygen Therapy: Evaluate for HBO Therapy Indication: - soft tissue radionecrosis If appropriate for treatment, begin HBOT per  protocol: 2.0 ATA for 90 Minutes without Air Breaks T Number of Treatments: - 40 otal One treatments per day (delivered Monday through Friday unless otherwise specified in Special Instructions below): Finger stick Blood Glucose Pre- and Post- HBOT Treatment. Follow Hyperbaric Oxygen Glycemia Protocol Afrin (Oxymetazoline HCL) 0.05% nasal spray - 1 spray in both nostrils daily as needed prior to HBO treatment for difficulty clearing ears WOUND #1: - Breast Wound Laterality: Left Cleanser: Normal Saline (Generic) 1 x Per Day/30 Days Discharge Instructions: Cleanse the wound with Normal Saline prior to applying a clean dressing using gauze sponges, not tissue or cotton balls. Cleanser: Soap and Water 1 x Per Day/30 Days Discharge Instructions: May shower and wash wound with dial antibacterial soap and water prior to dressing change. Cleanser: Byram Ancillary Kit - 15 Day Supply (Generic) 1 x Per Day/30 Days  Discharge Instructions: Use supplies as instructed; Kit contains: (15) Saline Bullets; (15) 3x3 Gauze; 15 pr Gloves Prim Dressing: Iodoform packing strip 1/2 (in) 1 x Per Day/30 Days ary Discharge Instructions: Lightly pack into tunneled area as instructed Prim Dressing: Santyl Ointment (Generic) 1 x Per Day/30 Days ary Discharge Instructions: Apply nickel thick amount to wound bed as instructed Secondary Dressing: Zetuvit Plus Silicone Border Dressing 5x5 (in/in) (Dispense As Written) 1 x Per Day/30 Days Discharge Instructions: Apply silicone border over primary dressing as directed. [name] ([id number]) [id number] Page 8 of 9 07/20/2022: The cavity continues to fill in and the surface of the wound is improving. I used a curette to debride slough from the wound surface. We will continue to pack the cavity with iodoform packing strips and apply Santyl to the surface. Hopefully she will be approved for hyperbaric oxygen therapy. We are also going to run  her insurance for a skin substitute. Follow-up in 1 week. Electronic Signature(s) Signed: 07/20/2022 11:26:10 AM By: [name] MD FACS Entered By: [name] on 07/20/2022 11:26:10 -------------------------------------------------------------------------------- HxROS Details Patient Name: Date of Service: [name], [name] NIE W. 07/20/2022 10:45 A M Medical Record Number: [id number] Patient Account Number: [id number] Date of Birth/Sex: Treating RN: 31-Mar-1971 (52 y.o. F) Primary Care Provider: [name] Other Clinician: Referring Provider: Treating Provider/Extender: Shea Stakes in Treatment: 12 Information Obtained From Patient Ear/Nose/Mouth/Throat Medical History: Past Medical History Notes: Left ear limited hearing Hematologic/Lymphatic Medical History: Positive for: Lymphedema - Left breast-gets Lymphadema tx Gastrointestinal Medical History: Past Medical History Notes: GERD Immunological Medical History: Past Medical History Notes: Wegener"s Granulomatosis Musculoskeletal Medical History: Past Medical History Notes: "RA like arthritis" as per patient Oncologic Medical History: Past Medical History Notes: Left Breast. 12/08/21- 01/29/22- 37 rounds of Radiation Psychiatric Medical History: Past Medical History Notes: General Anxiety Immunizations Pneumococcal Vaccine: Received Pneumococcal Vaccination: Yes Received Pneumococcal Vaccination On or After 60th BirthdayNEVE, BRANSCOMB ([id number]) [id number] Page 9 of 9 Implantable Devices Yes Hospitalization / Surgery History Type of Hospitalization/Surgery 03/25/22 Left Breast cyst excision; 11/12/21 Left Breast Lumpectomy with Radioactive seeds;Left breat Biopsy;Anterior Family and Social History Never smoker; Marital Status - Married; Alcohol Use: Moderate; Drug Use: No History; Caffeine Use: Daily; Financial Concerns: No; Food, Clothing  or Shelter Needs: No; Support System Lacking: No; Transportation Concerns: No Electronic Signature(s) Signed: 07/20/2022 12:17:58 PM By: [name] MD FACS Entered By: [name] on 07/20/2022 11:24:54 -------------------------------------------------------------------------------- SuperBill Details Patient Name: Date of Service: [name], [name] 07/20/2022 Medical Record Number: [id number] Patient Account Number: [id number] Date of Birth/Sex: Treating RN: 05/25/1971 (52 y.o. 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 Facility Procedures : CPT4 Code: 27741287 Description: 86767 - DEBRIDE WOUND 1ST 20 SQ CM OR < ICD-10 Diagnosis Description S21.002D Unspecified open wound of left breast, subsequent encounter L59.8 Other specified disorders of the skin and subcutaneous tissue related to radiat Modifier: ion Quantity: 1 Physician Procedures : CPT4 Code Description Modifier [id number] - WC PHYS LEVEL 4 - EST PT 25 ICD-10 Diagnosis Description S21.002D Unspecified open wound of left breast, subsequent encounter T81.31XD Disruption of external operation (  surgical) wound, not elsewhere  classified, subsequent encounter 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

## 2022-07-21 NOTE — Progress Notes (Signed)
[name] ([id number]) [id number] Page 1 of 7 Visit Report for 07/20/2022 Arrival Information Details Patient Name: Date of Service: [name] 07/20/2022 10:45 A M Medical Record Number: [id number] Patient Account Number: [id number] Date of Birth/Sex: Treating RN: March 31, 1971 (52 y.o. [name], [place] Primary Care [name]: [name] Other Clinician: Referring Kamarii Buren: Treating [name]/Extender: Shea Stakes in Treatment: 12 Visit Information History Since Last Visit Added or deleted any medications: No Patient Arrived: Ambulatory Any new allergies or adverse reactions: No Arrival Time: 10:53 Had a fall or experienced change in No Accompanied By: self activities of daily living that may affect Transfer Assistance: None risk of falls: Patient Identification Verified: Yes Signs or symptoms of abuse/neglect since last visito No Patient Requires Transmission-Based Precautions: No Hospitalized since last visit: No Patient Has Alerts: No Implantable device outside of the clinic excluding No cellular tissue based products placed in the center since last visit: Has Dressing in Place as Prescribed: Yes Pain Present Now: No Electronic Signature(s) Signed: 07/21/2022 3:04:08 PM By: [name] East RN Entered By: [name] on 07/20/2022 10:56:03 -------------------------------------------------------------------------------- Encounter Discharge Information Details Patient Name: Date of Service: [name], [name] 07/20/2022 10:45 A M Medical Record Number: [id number] Patient Account Number: [id number] Date of Birth/Sex: Treating RN: 08/09/1970 (52 y.o. [name] Primary Care [name]: [name] Other Clinician: Referring [name]: Treating Janiyla Long/Extender: Shea Stakes in Treatment: 12 Encounter Discharge Information Items Post Procedure Vitals Discharge Condition:  Stable Temperature (F): 98.3 Ambulatory Status: Ambulatory Pulse (bpm): 85 Discharge Destination: Home Respiratory Rate (breaths/min): 18 Transportation: Private Auto Blood Pressure (mmHg): 117/86 Accompanied By: self Schedule Follow-up Appointment: No Clinical Summary of Care: Electronic Signature(s) Signed: 07/21/2022 3:04:08 PM By: [name] East RN Entered By: [name] on 07/20/2022 11:11:08 [name] ([id number]) [id number] Page 2 of 7 -------------------------------------------------------------------------------- Lower Extremity Assessment Details Patient Name: Date of Service: [name] 07/20/2022 10:45 A M Medical Record Number: [id number] Patient Account Number: [id number] Date of Birth/Sex: Treating RN: 1970-09-03 (52 y.o. [name] Primary Care [name]: [name] Other Clinician: Referring [name]: Treating Mirka Barbone/Extender: [name], [name] Weeks in Treatment: 12 Electronic Signature(s) Signed: 07/21/2022 3:04:08 PM By: [name] East RN Entered By: [name] on 07/20/2022 10:57:50 -------------------------------------------------------------------------------- Multi Wound Chart Details Patient Name: Date of Service: [name], [name] 07/20/2022 10:45 A M Medical Record Number: [id number] Patient Account Number: [id number] Date of Birth/Sex: Treating RN: 03/29/1971 (52 y.o. F) Primary Care [name]: [name] Other Clinician: Referring [name]: Treating [name]/Extender: [name], [name] Weeks in Treatment: 12 Vital Signs Height(in): 63 Pulse(bpm): 85 Weight(lbs): 170 Blood Pressure(mmHg): 117/86 Body Mass Index(BMI): 30.1 Temperature(F): 98.3 Respiratory Rate(breaths/min): 18 [1:Photos:] [N/A:N/A] Left Breast N/A N/A Wound Location: Surgical Injury N/A N/A Wounding Event: Malignant Wound N/A N/A Primary Etiology: Lymphedema N/A N/A Comorbid  History: 04/03/2022 N/A N/A Date Acquired: 12 N/A N/A Weeks of Treatment: Open N/A N/A Wound Status: No N/A N/A Wound Recurrence: 2.3x3x0.1 N/A N/A Measurements L x [name] x D (cm) 5.419 N/A N/A A (cm) : rea 0.542 N/A N/A Volume (cm) : 59.30% N/A N/A % Reduction in A rea: 89.80% N/A N/A % Reduction in Volume: 1 Position 1 (o'clock): 0.4 Maximum Distance 1 (cm): Yes N/A N/A Tunneling: Full Thickness Without Exposed N/A N/A Classification: Support Structures Medium N/A N/A Exudate Amount: Serosanguineous N/A N/A Exudate Type: red, brown N/A N/A Exudate Color: Distinct, outline attached N/A N/A Wound Margin: [place], Phelan  [name] ([id number]) [id number] Page 3 of 7 Medium (34-66%) N/A N/A Granulation Amount: Red N/A N/A Granulation Quality: Medium (34-66%) N/A N/A Necrotic Amount: Fat Layer (Subcutaneous Tissue): Yes N/A N/A Exposed Structures: Fascia: No Tendon: No Muscle: No Joint: No Bone: No Small (1-33%) N/A N/A Epithelialization: Debridement - Selective/Open Wound N/A N/A Debridement: Pre-procedure Verification/Time Out 11:08 N/A N/A Taken: Lidocaine 5% topical ointment N/A N/A Pain Control: Slough N/A N/A Tissue Debrided: Non-Viable Tissue N/A N/A Level: 6.9 N/A N/A Debridement A (sq cm): rea Curette N/A N/A Instrument: Minimum N/A N/A Bleeding: Pressure N/A N/A Hemostasis A chieved: Procedure was tolerated well N/A N/A Debridement Treatment Response: 2.3x3x0.1 N/A N/A Post Debridement Measurements L x [name] x D (cm) 0.542 N/A N/A Post Debridement Volume: (cm) Induration: Yes N/A N/A Periwound Skin Texture: No Abnormalities Noted N/A N/A Periwound Skin Moisture: No Abnormalities Noted N/A N/A Periwound Skin Color: No Abnormality N/A N/A Temperature: Debridement N/A N/A Procedures Performed: Treatment Notes Wound #1 (Breast) Wound Laterality: Left Cleanser Normal Saline Discharge Instruction: Cleanse the  wound with Normal Saline prior to applying a clean dressing using gauze sponges, not tissue or cotton balls. Soap and Water Discharge Instruction: May shower and wash wound with dial antibacterial soap and water prior to dressing change. Byram Ancillary Kit - 15 Day Supply Discharge Instruction: Use supplies as instructed; Kit contains: (15) Saline Bullets; (15) 3x3 Gauze; 15 pr Gloves Peri-Wound Care Topical Primary Dressing Iodoform packing strip 1/2 (in) Discharge Instruction: Lightly pack into tunneled area as instructed Santyl Ointment Discharge Instruction: Apply nickel thick amount to wound bed as instructed Secondary Dressing Zetuvit Plus Silicone Border Dressing 5x5 (in/in) Discharge Instruction: Apply silicone border over primary dressing as directed. Secured With Compression Wrap Compression Stockings Environmental education officer) Signed: 07/20/2022 11:24:01 AM By: [name] MD FACS Entered By: [name] on 07/20/2022 11:24:01 Multi-Disciplinary Care Plan Details -------------------------------------------------------------------------------- [name] ([id number]) [id number] Page 4 of 7 Patient Name: Date of Service: [name], [name] 07/20/2022 10:45 A M Medical Record Number: [id number] Patient Account Number: [id number] Date of Birth/Sex: Treating RN: 1971-01-21 (52 y.o. [name] Primary Care Winda Summerall: [name] Other Clinician: Referring Aide Wojnar: Treating Augustine Leverette/Extender: Shea Stakes in Treatment: [place] reviewed with physician Active Inactive Wound/Skin Impairment Nursing Diagnoses: Impaired tissue integrity Goals: Patient/caregiver will verbalize understanding of skin care regimen Date Initiated: 04/24/2022 Target Resolution Date: 08/07/2022 Goal Status: Active Interventions: Assess ulceration(s) every visit Treatment Activities: Skin care  regimen initiated : 04/24/2022 Notes: Electronic Signature(s) Signed: 07/21/2022 3:04:08 PM By: [name] East RN Entered By: [name] on 07/20/2022 11:09:57 -------------------------------------------------------------------------------- Pain Assessment Details Patient Name: Date of Service: [name] 07/20/2022 10:45 A M Medical Record Number: [id number] Patient Account Number: [id number] Date of Birth/Sex: Treating RN: 09/13/1970 (52 y.o. [name] Primary Care Timber Marshman: [name] Other Clinician: Referring Nesta Scaturro: Treating Hatsumi Steinhart/Extender: Shea Stakes in Treatment: 12 Active Problems Location of Pain Severity and Description of Pain Patient Has Paino No Site Locations Rate the pain. Current Pain Level: 0 Pain Management and Medication JEYLIN, WOODMANSEE ([id number]) [id number] Page 5 of 7 Current Pain Management: Electronic Signature(s) Signed: 07/21/2022 3:04:08 PM By: [name] East RN Entered By: [name] on 07/20/2022 10:56:24 -------------------------------------------------------------------------------- Patient/Caregiver Education Details Patient Name: Date of Service: [name] 1/15/2024andnbsp10:45 A M Medical Record Number: [id number] Patient Account Number: [id number] Date of Birth/Gender: Treating RN: [birth year] (52 y.o. [name] Primary Care Physician:  Pahwani, [name] Other Clinician: Referring Physician: Treating Physician/Extender: Shea Stakes in Treatment: 12 Education Assessment Education Provided To: Patient Education Topics Provided Hyperbaric Oxygenation: Methods: Explain/Verbal Responses: Reinforcements needed, State content correctly Wound/Skin Impairment: Methods: Explain/Verbal Responses: Reinforcements needed, State content correctly Electronic Signature(s) Signed: 07/21/2022 3:04:08 PM By: [name] East RN Entered  By: [name] on 07/20/2022 11:10:23 -------------------------------------------------------------------------------- Wound Assessment Details Patient Name: Date of Service: [name] 07/20/2022 10:45 A M Medical Record Number: [id number] Patient Account Number: [id number] Date of Birth/Sex: Treating RN: [birth year] (52 y.o. [name] Primary Care Reon Hunley: [name] Other Clinician: Referring Akyah Lagrange: Treating Jasen Hartstein/Extender: [name], [name] Weeks in Treatment: 12 Wound Status Wound Number: 1 Primary Etiology: Malignant Wound Wound Location: Left Breast Wound Status: Open Wounding Event: Surgical Injury Comorbid History: Lymphedema Date Acquired: 04/03/2022 Weeks Of Treatment: 12 Clustered Wound: No Photos [name], [name] ([id number]) [id number] Page 6 of 7 Wound Measurements Length: (cm) 2.3 Width: (cm) 3 Depth: (cm) 0.1 Area: (cm) 5.419 Volume: (cm) 0.542 % Reduction in Area: 59.3% % Reduction in Volume: 89.8% Epithelialization: Small (1-33%) Tunneling: Yes Position (o'clock): 1 Maximum Distance: (cm) 0.4 Undermining: No Wound Description Classification: Full Thickness Without Exposed Support Structures Wound Margin: Distinct, outline attached Exudate Amount: Medium Exudate Type: Serosanguineous Exudate Color: red, brown Foul Odor After Cleansing: No Slough/Fibrino Yes Wound Bed Granulation Amount: Medium (34-66%) Exposed Structure Granulation Quality: Red Fascia Exposed: No Necrotic Amount: Medium (34-66%) Fat Layer (Subcutaneous Tissue) Exposed: Yes Necrotic Quality: Adherent Slough Tendon Exposed: No Muscle Exposed: No Joint Exposed: No Bone Exposed: No Periwound Skin Texture Texture Color No Abnormalities Noted: No No Abnormalities Noted: Yes Induration: Yes Temperature / Pain Temperature: No Abnormality Moisture No Abnormalities Noted: Yes Treatment Notes Wound #1  (Breast) Wound Laterality: Left Cleanser Normal Saline Discharge Instruction: Cleanse the wound with Normal Saline prior to applying a clean dressing using gauze sponges, not tissue or cotton balls. Soap and Water Discharge Instruction: May shower and wash wound with dial antibacterial soap and water prior to dressing change. Byram Ancillary Kit - 15 Day Supply Discharge Instruction: Use supplies as instructed; Kit contains: (15) Saline Bullets; (15) 3x3 Gauze; 15 pr Gloves Peri-Wound Care Topical Primary Dressing Iodoform packing strip 1/2 (in) Discharge Instruction: Lightly pack into tunneled area as instructed Santyl Ointment Discharge Instruction: Apply nickel thick amount to wound bed as instructed Secondary Dressing Zetuvit Plus Silicone Border Dressing 5x5 (in/in) Discharge Instruction: Apply silicone border over primary dressing as directed. Secured With [name], [name] ([id number]) [id number] Page 7 of 7 Compression Wrap Compression Stockings Add-Ons Electronic Signature(s) Signed: 07/21/2022 3:04:08 PM By: [name] East RN Entered By: [name] on 07/20/2022 11:01:45 -------------------------------------------------------------------------------- Vitals Details Patient Name: Date of Service: [name], [place] [name] 07/20/2022 10:45 A M Medical Record Number: [id number] Patient Account Number: [id number] Date of Birth/Sex: Treating RN: [birth year] (52 y.o. [name], Jamie Primary Care Kenda Kloehn: [name] Other Clinician: Referring [place] Frieden: Treating Vipul Cafarelli/Extender: [name], [name] Weeks in Treatment: 12 Vital Signs Time Taken: 10:56 Temperature (F): 98.3 Height (in): 63 Pulse (bpm): 85 Weight (lbs): 170 Respiratory Rate (breaths/min): 18 Body Mass Index (BMI): 30.1 Blood Pressure (mmHg): 117/86 Reference Range: 80 - 120 mg / dl Electronic Signature(s) Signed: 07/21/2022 3:04:08 PM By: [name] East  RN Entered By: [name] on 07/20/2022 10:56:18

## 2022-07-27 ENCOUNTER — Encounter (HOSPITAL_BASED_OUTPATIENT_CLINIC_OR_DEPARTMENT_OTHER): Payer: 59 | Admitting: General Surgery

## 2022-07-27 DIAGNOSIS — T8131XA Disruption of external operation (surgical) wound, not elsewhere classified, initial encounter: Secondary | ICD-10-CM | POA: Diagnosis not present

## 2022-07-27 NOTE — Progress Notes (Signed)
[name], [name] ([id number]) [id number] Page 1 of 6 Visit Report for 07/27/2022 Arrival Information Details Patient Name: Date of Service: [name] 07/27/2022 9:15 A M Medical Record Number: [id number] Patient Account Number: [id number] Date of Birth/Sex: Treating RN: [birth year] (52 y.o. F) [place], [place] Primary Care Caitlan Chauca: [name] Other Clinician: Referring Janeil Schexnayder: Treating Birda Didonato/Extender: [name] in Treatment: 5 Visit Information History Since Last Visit Added or deleted any medications: No Patient Arrived: Ambulatory Any new allergies or adverse reactions: No Arrival Time: 09:25 Had a fall or experienced change in No Accompanied By: self activities of daily living that may affect Transfer Assistance: None risk of falls: Patient Requires Transmission-Based Precautions: No Hospitalized since last visit: No Patient Has Alerts: No Implantable device outside of the clinic excluding No cellular tissue based products placed in the center since last visit: Has Dressing in Place as Prescribed: Yes Pain Present Now: No Electronic Signature(s) Signed: 07/27/2022 4:52:32 PM By: Blanche East RN Entered By: Blanche East on 07/27/2022 09:26:16 -------------------------------------------------------------------------------- Encounter Discharge Information Details Patient Name: Date of Service: [name] RD, [place] W. 07/27/2022 9:15 A M Medical Record Number: [id number] Patient Account Number: [id number] Date of Birth/Sex: Treating RN: 07/18/1970 (52 y.o. [name] Primary Care Daxten Kovalenko: [name] Other Clinician: Referring Marelin Tat: Treating Mikhael Hendriks/Extender: [name] in Treatment: 42 Encounter Discharge Information Items Post Procedure Vitals Discharge Condition: Stable Temperature (F): 98.3 Ambulatory Status: Ambulatory Pulse (bpm): 86 Discharge Destination:  Home Respiratory Rate (breaths/min): 16 Transportation: Private Auto Blood Pressure (mmHg): 122/83 Accompanied By: self Schedule Follow-up Appointment: No Clinical Summary of Care: Electronic Signature(s) Signed: 07/27/2022 4:52:32 PM By: Blanche East RN Entered By: Blanche East on 07/27/2022 09:41:39 [name] ([id number]) [id number] Page 2 of 6 -------------------------------------------------------------------------------- Lower Extremity Assessment Details Patient Name: Date of Service: [name] 07/27/2022 9:15 A M Medical Record Number: [id number] Patient Account Number: [id number] Date of Birth/Sex: Treating RN: [birth year] (52 y.o. [name] Primary Care Jillyan Plitt: [name] Other Clinician: Referring Samir Ishaq: Treating Nadelyn Enriques/Extender: [name], [name] in Treatment: 13 Electronic Signature(s) Signed: 07/27/2022 4:52:32 PM By: Blanche East RN Entered By: Blanche East on 07/27/2022 09:26:45 -------------------------------------------------------------------------------- Multi Wound Chart Details Patient Name: Date of Service: [name] 07/27/2022 9:15 A M Medical Record Number: [id number] Patient Account Number: [id number] Date of Birth/Sex: Treating RN: 1970-07-26 (52 y.o. F) Primary Care Mario Voong: [name] Other Clinician: Referring Garrell Flagg: Treating Ayodeji Keimig/Extender: [name], [name] in Treatment: 13 Vital Signs Height(in): 63 Pulse(bpm): 86 Weight(lbs): 170 Blood Pressure(mmHg): 122/83 Body Mass Index(BMI): 30.1 Temperature(F): 98.3 Respiratory Rate(breaths/min): 16 [1:Photos:] [N/A:N/A] Left Breast N/A N/A Wound Location: Surgical Injury N/A N/A Wounding Event: Malignant Wound N/A N/A Primary Etiology: Lymphedema N/A N/A Comorbid History: 04/03/2022 N/A N/A Date Acquired: 13 N/A N/A [name] of Treatment: Open N/A N/A Wound  Status: No N/A N/A Wound Recurrence: 2x2.6x0.1 N/A N/A Measurements L x W x D (cm) 4.084 N/A N/A A (cm) : rea 0.408 N/A N/A Volume (cm) : 69.30% N/A N/A % Reduction in A rea: 92.30% N/A N/A % Reduction in Volume: 12 Position 1 (o'clock): 0.4 Maximum Distance 1 (cm): Yes N/A N/A Tunneling: Full Thickness Without Exposed N/A N/A Classification: Support Structures Medium N/A N/A Exudate Amount: Serosanguineous N/A N/A Exudate Type: red, brown N/A N/A Exudate Color: Distinct, outline attached N/A N/A Wound MarginRIDA, LOUDIN ([id number]) [id number] Page 3 of 6 Medium (34-66%) N/A N/A Granulation Amount:  Red N/A N/A Granulation Quality: Medium (34-66%) N/A N/A Necrotic Amount: Fat Layer (Subcutaneous Tissue): Yes N/A N/A Exposed Structures: Fascia: No Tendon: No Muscle: No Joint: No Bone: No Small (1-33%) N/A N/A Epithelialization: Debridement - Selective/Open Wound N/A N/A Debridement: Pre-procedure Verification/Time Out 09:35 N/A N/A Taken: Lidocaine 5% topical ointment N/A N/A Pain Control: Slough N/A N/A Tissue Debrided: Non-Viable Tissue N/A N/A Level: 5.2 N/A N/A Debridement A (sq cm): rea Curette N/A N/A Instrument: Minimum N/A N/A Bleeding: Pressure N/A N/A Hemostasis A chieved: Procedure was tolerated well N/A N/A Debridement Treatment Response: 2x2.6x0.1 N/A N/A Post Debridement Measurements L x W x D (cm) 0.408 N/A N/A Post Debridement Volume: (cm) Induration: Yes N/A N/A Periwound Skin Texture: No Abnormalities Noted N/A N/A Periwound Skin Moisture: No Abnormalities Noted N/A N/A Periwound Skin Color: No Abnormality N/A N/A Temperature: Debridement N/A N/A Procedures Performed: Treatment Notes Electronic Signature(s) Signed: 07/27/2022 9:41:00 AM By: [name] MD FACS Entered By: [name] on 07/27/2022  09:41:00 -------------------------------------------------------------------------------- Multi-Disciplinary Care Plan Details Patient Name: Date of Service: [name] RD, [place] W. 07/27/2022 9:15 A M Medical Record Number: [id number] Patient Account Number: [id number] Date of Birth/Sex: Treating RN: 14-Mar-1971 (52 y.o. [name] Primary Care [place] Dodgen: [name] Other Clinician: Referring Tierra Divelbiss: Treating Tiziana Cislo/Extender: [name] in Treatment: [place] reviewed with physician Active Inactive Wound/Skin Impairment Nursing Diagnoses: Impaired tissue integrity Goals: Patient/caregiver will verbalize understanding of skin care regimen Date Initiated: 04/24/2022 Target Resolution Date: 08/07/2022 Goal Status: Active Interventions: Assess ulceration(s) every visit Treatment Activities: Skin care regimen initiated : 04/24/2022 Notes: Electronic Signature(s) DARNISHA, VERNET ([id number]) [id number] Page 4 of 6 Signed: 07/27/2022 4:52:32 PM By: Blanche East RN Entered By: Blanche East on 07/27/2022 09:39:20 -------------------------------------------------------------------------------- Pain Assessment Details Patient Name: Date of Service: [name] 07/27/2022 9:15 A M Medical Record Number: [id number] Patient Account Number: [id number] Date of Birth/Sex: Treating RN: 11/23/1970 (52 y.o. [name] Primary Care Janeshia Ciliberto: [name] Other Clinician: Referring Mirko Tailor: Treating Lareen Mullings/Extender: [name] in Treatment: 24 Active Problems Location of Pain Severity and Description of Pain Patient Has Paino No Site Locations Rate the pain. Current Pain Level: 0 Pain Management and Medication Current Pain Management: Electronic Signature(s) Signed: 07/27/2022 4:52:32 PM By: Blanche East RN Entered By: Blanche East on 07/27/2022  09:26:36 -------------------------------------------------------------------------------- Patient/Caregiver Education Details Patient Name: Date of Service: [name] 1/22/2024andnbsp9:15 A M Medical Record Number: [id number] Patient Account Number: [id number] Date of Birth/Gender: Treating RN: 1970-10-06 (52 y.o. [name] Primary Care Physician: [name] Other Clinician: Referring Physician: Treating Physician/Extender: [name] in Treatment: 13 Education Assessment Education Provided To: Patient MALAAK, STACH ([id number]) [id number] Page 5 of 6 Education Topics Provided Wound/Skin Impairment: Methods: Explain/Verbal Responses: Reinforcements needed, State content correctly Electronic Signature(s) Signed: 07/27/2022 4:52:32 PM By: Blanche East RN Entered By: Blanche East on 07/27/2022 09:39:34 -------------------------------------------------------------------------------- Wound Assessment Details Patient Name: Date of Service: [name] 07/27/2022 9:15 A M Medical Record Number: [id number] Patient Account Number: [id number] Date of Birth/Sex: Treating RN: 1971/02/25 (52 y.o. [name] Primary Care Robinson Brinkley: [name] Other Clinician: Referring Anay Rathe: Treating Lasaro Primm/Extender: [name], [name] in Treatment: 13 Wound Status Wound Number: 1 Primary Etiology: Malignant Wound Wound Location: Left Breast Wound Status: Open Wounding Event: Surgical Injury Comorbid History: Lymphedema Date Acquired: 04/03/2022 [name] Of Treatment: 13 Clustered Wound: No Photos Wound Measurements Length: (cm)  2 Width: (cm) 2.6 Depth: (cm) 0.1 Area: (cm) 4.084 Volume: (cm) 0.408 % Reduction in Area: 69.3% % Reduction in Volume: 92.3% Epithelialization: Small (1-33%) Tunneling: Yes Position (o'clock): 12 Maximum Distance: (cm) 0.4 Undermining: No Wound  Description Classification: Full Thickness Without Exposed Suppor Wound Margin: Distinct, outline attached Exudate Amount: Medium Exudate Type: Serosanguineous Exudate Color: red, brown t Structures Foul Odor After Cleansing: No Slough/Fibrino Yes Wound Bed Granulation Amount: Medium (34-66%) Exposed Structure Granulation Quality: Red Fascia Exposed: No Necrotic Amount: Medium (34-66%) Fat Layer (Subcutaneous Tissue) Exposed: Yes Necrotic Quality: Adherent Slough Tendon Exposed: No Muscle Exposed: No Joint Exposed: No COURTLAND, COPPA ([id number]) [id number] Page 6 of 6 Bone Exposed: No Periwound Skin Texture Texture Color No Abnormalities Noted: No No Abnormalities Noted: Yes Induration: Yes Temperature / Pain Temperature: No Abnormality Moisture No Abnormalities Noted: Yes Treatment Notes Wound #1 (Breast) Wound Laterality: Left Cleanser Normal Saline Discharge Instruction: Cleanse the wound with Normal Saline prior to applying a clean dressing using gauze sponges, not tissue or cotton balls. Soap and Water Discharge Instruction: May shower and wash wound with dial antibacterial soap and water prior to dressing change. [place] Ancillary Kit - 15 Day Supply Discharge Instruction: Use supplies as instructed; Kit contains: (15) Saline Bullets; (15) 3x3 Gauze; 15 pr Gloves Peri-Wound Care Topical Primary Dressing Promogran Prisma Matrix, 4.34 (sq in) (silver collagen) Discharge Instruction: Moisten collagen with saline or hydrogel Secondary Dressing Zetuvit Plus Silicone Border Dressing 5x5 (in/in) Discharge Instruction: Apply silicone border over primary dressing as directed. Secured With Compression Wrap Compression Stockings Environmental education officer) Signed: 07/27/2022 4:52:32 PM By: Blanche East RN Entered By: Blanche East on 07/27/2022  09:30:34 -------------------------------------------------------------------------------- Vitals Details Patient Name: Date of Service: [name] RD, [place] W. 07/27/2022 9:15 A M Medical Record Number: [id number] Patient Account Number: [id number] Date of Birth/Sex: Treating RN: [birth year] (52 y.o. [name], Jamie Primary Care Arthea Nobel: [name] Other Clinician: Referring Janeka Libman: Treating Asti Mackley/Extender: [name], [name] in Treatment: 13 Vital Signs Time Taken: 09:26 Temperature (F): 98.3 Height (in): 63 Pulse (bpm): 86 Weight (lbs): 170 Respiratory Rate (breaths/min): 16 Body Mass Index (BMI): 30.1 Blood Pressure (mmHg): 122/83 Reference Range: 80 - 120 mg / dl Electronic Signature(s) Signed: 07/27/2022 4:52:32 PM By: Blanche East RN Entered By: Blanche East on 07/27/2022 09:26:30

## 2022-07-27 NOTE — Progress Notes (Signed)
HARLEAN, REGULA ([id number]) [id number] Page 1 of 9 Visit Report for 07/27/2022 Chief Complaint Document Details Patient Name: Date of Service: [name] 07/27/2022 9:15 A M Medical Record Number: [id number] Patient Account Number: [id number] Date of Birth/Sex: Treating RN: 03-09-1971 (52 y.o. F) Primary Care Provider: [name] Other Clinician: Referring Provider: Treating Provider/Extender: [name], [name] Weeks in Treatment: 13 Information Obtained from: Patient Chief Complaint 04/24/2022; patient is here for a surgical wound on her left lateral breast Electronic Signature(s) Signed: 07/27/2022 9:41:06 AM By: [name] MD FACS Entered By: [name] on 07/27/2022 09:41:06 -------------------------------------------------------------------------------- Debridement Details Patient Name: Date of Service: [name] 07/27/2022 9:15 A M Medical Record Number: [id number] Patient Account Number: [id number] Date of Birth/Sex: Treating RN: 1971/02/28 (52 y.o. [name], Jamie Primary Care Provider: [name] Other Clinician: Referring Provider: Treating Provider/Extender: Shea Stakes in Treatment: 13 Debridement Performed for Assessment: Wound #1 Left Breast Performed By: Physician [name], MD Debridement Type: Debridement Level of Consciousness (Pre-procedure): Awake and Alert Pre-procedure Verification/Time Out Yes - 09:35 Taken: Start Time: 09:36 Pain Control: Lidocaine 5% topical ointment T Area Debrided (L x W): otal 2 (cm) x 2.6 (cm) = 5.2 (cm) Tissue and other material debrided: Non-Viable, Slough, Slough Level: Non-Viable Tissue Debridement Description: Selective/Open Wound Instrument: Curette Bleeding: Minimum Hemostasis Achieved: Pressure Response to Treatment: Procedure was tolerated well Level of Consciousness (Post- Awake and  Alert procedure): Post Debridement Measurements of Total Wound Length: (cm) 2 Width: (cm) 2.6 Depth: (cm) 0.1 Volume: (cm) 0.408 Character of Wound/Ulcer Post Debridement: Improved Post Procedure Diagnosis Same as Pre-procedure MARNEY, TRELOAR ([id number]) [id number] Page 2 of 9 Notes Scribed for Dr. Celine Ahr by Blanche East, RN Electronic Signature(s) Signed: 07/27/2022 11:23:06 AM By: [name] MD FACS Signed: 07/27/2022 4:52:32 PM By: Blanche East RN Entered By: Blanche East on 07/27/2022 09:38:02 -------------------------------------------------------------------------------- HPI Details Patient Name: Date of Service: [name], [name] 07/27/2022 9:15 A M Medical Record Number: [id number] Patient Account Number: [id number] Date of Birth/Sex: Treating RN: Sep 04, 1970 (52 y.o. F) Primary Care Provider: [name] Other Clinician: Referring Provider: Treating Provider/Extender: [name], [name] Weeks in Treatment: 13 History of Present Illness HPI Description: ADMISSION 05/25/2022 This is a 52 year old woman who is found to have an abnormal mammogram of her left breast earlier this year showing a 13 mm group of calcifications in the upper quadrant of the left breast. 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 She underwent a left lumpectomy on 11/12/2021. The patient states the wound never really healed and was open. She underwent radiation therapy from 12/10/2021 through 01/19/22 28 fractions of 1.8GY. Essentially the wound would not heal. She was treated several times with antibiotics finally on 03/25/2022 she was taken to the OR by Dr. Barry Dienes for excision of the wound in a sinus. Operative cultures grew strep and Prevotella and a culture from the clinic Livedo MRSA. It was recommended for 4 weeks of Augmentin and doxycycline by infectious disease. She  was seen by wound care and she has been treating the wound with Medihoney ever since. 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 She was on meth methotrexate 20 mg once  a week however this I think was put on hold because of the wound followed by Dr. Posey Pronto of rheumatology. She has asthma. Most recent breast ultrasound was on 9/7 She has a large nonhealing wound on the lateral aspect of her left breast 04/30/2022: Although I do not have a photograph to compare to last week, the RN report is that it is substantially cleaner. She still has ample slough and nonviable fat and subcutaneous tissue present. She only was able to get her Santyl on Monday so she has just had a couple of days of treatment. 05/08/2022: The wound dimensions are about the same, but the surface is substantially cleaner. There is just a little bit of slough accumulation on the surface. 05/18/2022: The wound is a little bit smaller, but not much. It is quite a bit cleaner, however. 05/25/2022: The wound surface continues to improve. Very little slough accumulation this week. No substantial change in the wound dimensions. 06/01/2022: The wound surface continues to have a layer of slough on it. There is a crack in the surface near the 12 o'clock position that when further explored, revealed a cavity and tunnel that is about 2 cm deep. No purulent drainage or concern for infection. 12/11; left breast wound. The wound itself is somewhat smaller in size per our measurements. However she has a divot at 1:00 that measures about a 1.5 cm deep. We have been using iodoform packing and if it and Santyl to the rest of the wound This wound was initially had a left lumpectomy on 11/12/2021 she underwent 28  fractions of radiation. She potentially could benefit from hyperbaric oxygen 12/18; left breast wound. Better looking wound surface and improvement in measurements of the small tunnel. We have been using Santyl and iodoform. 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 Underneath the layer of slough, the tissue is much healthier-looking, with better color and some granulation tissue beginning to form. 07/13/2022: The wound continues to fill with better looking granulation tissue. There is very little slough accumulation and the cavity is shallower. We are going to submit for insurance approval for hyperbaric oxygen therapy, as it has now been 6 months since the time of her initial wound and radiation therapy. 07/20/2022: The cavity continues to fill in and the surface of the wound is improving. EKG and chest x-ray are complete and we are just awaiting insurance approval for hyperbarics. 07/27/2022: The cavity has filled in considerably and the surface of the wound is very clean with just a thin layer of light slough. We are still awaiting insurance approval for hyperbaric oxygen therapy. Electronic Signature(s) Signed: 07/27/2022 9:41:51 AM By: [name] MD FACS Entered By: [name] on 07/27/2022 09:41:51 [name] ([id number]) [id number] Page 3 of 9 -------------------------------------------------------------------------------- Physical Exam Details Patient Name: Date of Service: [name] 07/27/2022 9:15 A M Medical Record Number: [id number] Patient Account Number: [id number] Date of Birth/Sex: Treating RN: 07/27/1970 (52 y.o. F) Primary Care Provider: [name] Other Clinician: Referring Provider: Treating Provider/Extender:  [name], [name] Weeks in Treatment: 13 Constitutional . . . . no acute distress. Respiratory Normal work of breathing on room air. Notes 07/27/2022: The cavity has filled in considerably and the surface of the wound is very clean with just a thin  layer of light slough. Electronic Signature(s) Signed: 07/27/2022 9:42:13 AM By: [name] MD FACS Entered By: [name] on 07/27/2022 09:42:13 -------------------------------------------------------------------------------- Physician Orders Details Patient Name: Date of Service: [name] 07/27/2022 9:15 A M Medical Record Number: [id number] Patient Account Number: [id number] Date of Birth/Sex: Treating RN: [birth year] (52 y.o. [name] Primary Care Provider: [name] Other Clinician: Referring Provider: Treating Provider/Extender: Shea Stakes in Treatment: 85 Verbal / Phone Orders: No Diagnosis Coding ICD-10 Coding Code Description S21.002D Unspecified open wound of left breast, subsequent encounter T81.31XD Disruption of external operation (surgical) wound, not elsewhere classified, subsequent encounter 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 Follow-up Appointments ppointment in 1 week. - Dr. Celine Ahr Room 4 Return A Monday 08/03/22 at 7:45 Anesthetic (In clinic) Topical Lidocaine 5% applied to wound bed Bathing/ Shower/ Hygiene Other Bathing/Shower/Hygiene Orders/Instructions: - Change dressing after bathing Edema Control - Lymphedema / SCD / Other Other Edema Control Orders/Instructions: - Keep doing the Lymphadema checks Hyperbaric Oxygen Therapy Evaluate for HBO Therapy Indication: - soft tissue radionecrosis If appropriate for treatment, begin HBOT per protocol: 2.0 ATA for 90 Minutes without A Breaks ir Total Number of Treatments: - [address] MILLIANI, HERRADA ([id number]) [id number] Page 4 of 9 One treatments per day (delivered Monday through Friday unless otherwise specified in Special Instructions below): Finger stick Blood Glucose Pre- and Post- HBOT Treatment. Follow Hyperbaric Oxygen Glycemia Protocol Afrin (Oxymetazoline HCL) 0.05% nasal spray - 1 spray in both nostrils daily as needed prior to HBO treatment for difficulty clearing ears Wound Treatment Wound #1 - Breast Wound Laterality: Left Cleanser: Normal Saline (Generic) 1 x Per Day/30 Days Discharge Instructions: Cleanse the wound with Normal Saline prior to applying a clean dressing using gauze sponges, not tissue or cotton balls. Cleanser: Soap and Water 1 x Per Day/30 Days Discharge Instructions: May shower and wash wound with dial antibacterial soap and water prior to dressing change. Cleanser: [place]ram Ancillary Kit - 15 Day Supply (Generic) 1 x Per Day/30 Days Discharge Instructions: Use supplies as instructed; Kit contains: (15) Saline Bullets; (15) 3x3 Gauze; 15 pr Gloves Prim Dressing: Promogran Prisma Matrix, 4.34 (sq in) (silver collagen) (DME) (Dispense As Written) 1 x Per Day/30 Days ary Discharge Instructions: Moisten collagen with saline or hydrogel Secondary Dressing: Zetuvit Plus Silicone Border Dressing 5x5 (in/in) (DME) (Dispense As Written) 1 x Per Day/30 Days Discharge Instructions: Apply silicone border over primary dressing as directed. GLYCEMIA INTERVENTIONS PROTOCOL PRE-HBO GLYCEMIA INTERVENTIONS ACTION INTERVENTION Obtain pre-HBO capillary blood glucose (ensure 1 physician order is in chart). A. Notify HBO physician and await physician orders. 2 If result is 70 mg/dl or below: B. If the result meets the hospital definition of a critical result, follow hospital policy. A. Give patient an 8 ounce Glucerna Shake, an 8 ounce Ensure, or 8 ounces of a Glucerna/Ensure equivalent dietary supplement*. B. Wait 30  minutes. If result is 71 mg/dl to 130 mg/dl: C. Retest patients capillary blood glucose (CBG). D. If result greater than or equal to 110 mg/dl, proceed with HBO. If result less than 110 mg/dl, notify HBO physician and consider holding HBO. If result is 131 mg/dl to 249 mg/dl: A. Proceed with HBO. A. Notify HBO physician and await physician orders. B. It is recommended to hold HBO and do If result is 250 mg/dl or greater: blood/urine ketone testing. C. If the result meets the  hospital definition of a critical result, follow hospital policy. POST-HBO GLYCEMIA INTERVENTIONS ACTION INTERVENTION Obtain post HBO capillary blood glucose (ensure 1 physician order is in chart). A. Notify HBO physician and await physician orders. 2 If result is 70 mg/dl or below: B. If the result meets the hospital definition of a critical result, follow hospital policy. A. Give patient an 8 ounce Glucerna Shake, an 8 ounce Ensure, or 8 ounces of a Glucerna/Ensure equivalent dietary supplement*. B. Wait 15 minutes for symptoms of If result is 71 mg/dl to 100 mg/dl: hypoglycemia (i.e. nervousness, anxiety, sweating, chills, clamminess, irritability, confusion, tachycardia or dizziness). C. If patient asymptomatic, discharge patient. If patient symptomatic, repeat capillary blood glucose (CBG) and notify HBO physician. If result is 101 mg/dl to 249 mg/dl: A. Discharge patient. A. Notify HBO physician and await physician orders. B. It is recommended to do blood/urine ketone If result is 250 mg/dl or greater: testing. C. If the result meets the hospital definition of a critical result, follow hospital policy. *Juice or candies are NOT equivalent products. If patient refuses the Glucerna or Ensure, please consult the hospital dietitian for an appropriate substitute. Electronic Signature(s) Signed: 07/27/2022 11:23:06 AM By: [name] MD FACS Entered By: [name] on 07/27/2022  09:42:26 [name] ([id number]) [id number] Page 5 of 9 -------------------------------------------------------------------------------- Problem List Details Patient Name: Date of Service: [name] 07/27/2022 9:15 A M Medical Record Number: [id number] Patient Account Number: [id number] Date of Birth/Sex: Treating RN: January 21, 1971 (52 y.o. F) Primary Care Provider: [name] Other Clinician: Referring Provider: Treating Provider/Extender: [name], [name] Weeks in Treatment: 13 Active Problems ICD-10 Encounter Code Description Active Date MDM Diagnosis S21.002D Unspecified open wound of left breast, subsequent encounter 04/24/2022 No Yes T81.31XD Disruption of external operation (surgical) wound, not elsewhere classified, 04/24/2022 No Yes subsequent encounter L59.8 Other specified disorders of the skin and subcutaneous tissue related to 04/24/2022 No Yes radiation 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 04/24/2022 No Yes Inactive Problems Resolved Problems Electronic Signature(s) Signed: 07/27/2022 9:40:55 AM By: [name] MD FACS Entered By: [name] on 07/27/2022 09:40:55 -------------------------------------------------------------------------------- Progress Note Details Patient Name: Date of Service: [name] [name] 07/27/2022 9:15 A M Medical Record Number: [id number] Patient Account Number: [id number] Date of Birth/Sex: Treating RN: June 03, 1971 (52 y.o. F) Primary Care Provider: [name] Other Clinician: Referring Provider: Treating Provider/Extender: [name], [name] Weeks in Treatment: 13 Subjective Chief Complaint Information obtained from Patient 04/24/2022; patient is here for a surgical wound on her left lateral breast History of Present Illness (HPI) ADMISSION [name], [name] ([id number]) [id number] Page 6 of  9 05/25/2022 This is a 52 year old woman who is found to have an abnormal mammogram of her left breast earlier this year showing a 13 mm group of calcifications in the upper quadrant of the left breast. 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 She underwent a left lumpectomy on 11/12/2021. The patient states the wound never really healed and was open. She underwent radiation therapy from 12/10/2021 through 01/19/22 28 fractions of 1.8GY. Essentially the wound would not heal. She was treated several times with antibiotics finally on 03/25/2022 she was taken to the OR by Dr. Barry Dienes for excision of the wound in a sinus. Operative cultures grew strep and Prevotella and a culture from the clinic [place] MRSA. It was recommended for 4 weeks of Augmentin and doxycycline by infectious disease.  She was seen by wound care and she has been treating the wound with Medihoney ever since. 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 She was on meth methotrexate 20 mg once a week however this I think was put on hold because of the wound followed by Dr. Posey Pronto of rheumatology. She has asthma. Most recent breast ultrasound was on 9/7 She has a large nonhealing wound on the lateral aspect of her left breast 04/30/2022: Although I do not have a photograph to compare to last week, the RN report is that it is substantially cleaner. She still has ample slough and nonviable fat and subcutaneous tissue present. She only was able to get her Santyl on Monday so she has just had a couple of days of treatment. 05/08/2022: The wound dimensions are about the same, but the surface is substantially cleaner. There is just a  little bit of slough accumulation on the surface. 05/18/2022: The wound is a little bit smaller, but not much. It is quite a bit cleaner, however. 05/25/2022: The wound surface continues to improve. Very little slough accumulation this week. No substantial change in the wound dimensions. 06/01/2022: The wound surface continues to have a layer of slough on it. There is a crack in the surface near the 12 o'clock position that when further explored, revealed a cavity and tunnel that is about 2 cm deep. No purulent drainage or concern for infection. 12/11; left breast wound. The wound itself is somewhat smaller in size per our measurements. However she has a divot at 1:00 that measures about a 1.5 cm deep. We have been using iodoform packing and if it and Santyl to the rest of the wound This wound was initially had a left lumpectomy on 11/12/2021 she underwent 28 fractions of radiation. She potentially could benefit from hyperbaric oxygen 12/18; left breast wound. Better looking wound surface and improvement in measurements of the small tunnel. We have been using Santyl and iodoform. 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 Underneath the layer of slough, the tissue is much healthier-looking, with better color and some granulation tissue beginning to form. 07/13/2022: The wound continues to fill with better looking granulation tissue. There is very little slough accumulation and the cavity is shallower. We are going to submit for insurance approval for hyperbaric oxygen therapy, as it has now been 6 months since the time of her initial wound and radiation therapy. 07/20/2022: The cavity continues to fill in and the surface of the wound is improving. EKG and chest x-ray are complete and we are just  awaiting insurance approval for hyperbarics. 07/27/2022: The cavity has filled in considerably and the surface of the wound is very clean with just a thin layer of light slough. We are still awaiting insurance approval for hyperbaric oxygen therapy. Patient History Information obtained from Patient. Social History Never smoker, Marital Status - Married, Alcohol Use - Moderate, Drug Use - No History, Caffeine Use - Daily. Medical History Hematologic/Lymphatic Patient has history of Lymphedema - Left breast-gets Lymphadema tx Hospitalization/Surgery History - 03/25/22 Left Breast cyst excision; 11/12/21 Left  Breast Lumpectomy with Radioactive seeds;Left breat Biopsy;Anterior. 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 12/08/21- 01/29/22- 37 rounds of Radiation Psychiatric General Anxiety Objective Constitutional no acute distress. Vitals Time Taken: 9:26 AM, Height: 63 in, Weight: 170 lbs, BMI: 30.1, Temperature: 98.3 F, Pulse: 86 bpm, Respiratory Rate: 16 breaths/min, Blood Pressure: [name], [name] ([id number]) [id number] Page 7 of 9 122/83 mmHg. Respiratory Normal work of breathing on room air. General Notes: 07/27/2022: The cavity has filled in considerably and the surface of the wound is very clean with just a thin layer of light slough. Integumentary (Hair, Skin) Wound #1 status is Open. Original cause of wound was Surgical Injury. The date acquired was: 04/03/2022. The wound has been in treatment 13 weeks. The wound is located on the Left Breast. The wound measures 2cm length x 2.6cm width x 0.1cm depth; 4.084cm^2 area and 0.408cm^3 volume. There is Fat Layer (Subcutaneous Tissue) exposed. There is no undermining noted, however, there is tunneling at 12:00 with a maximum  distance of 0.4cm. There is a medium amount of serosanguineous drainage noted. The wound margin is distinct with the outline attached to the wound base. There is medium (34-66%) red granulation within the wound bed. There is a medium (34-66%) amount of necrotic tissue within the wound bed including Adherent Slough. The periwound skin appearance had no abnormalities noted for moisture. The periwound skin appearance had no abnormalities noted for color. The periwound skin appearance exhibited: Induration. Periwound temperature was noted as No Abnormality. Assessment Active Problems ICD-10 Unspecified open wound of left breast, subsequent encounter Disruption of external operation (surgical) wound, not elsewhere classified, subsequent encounter Other specified disorders of the skin and subcutaneous tissue related to radiation Intraductal carcinoma in situ of left breast Procedures Wound #1 Pre-procedure diagnosis of Wound #1 is a Malignant Wound located on the Left Breast . There was a Selective/Open Wound Non-Viable Tissue Debridement with a total area of 5.2 sq cm performed by [name], MD. With the following instrument(s): Curette to remove Non-Viable tissue/material. Material removed includes [facility] after achieving pain control using Lidocaine 5% topical ointment. No specimens were taken. A time out was conducted at 09:35, prior to the start of the procedure. A Minimum amount of bleeding was controlled with Pressure. The procedure was tolerated well. Post Debridement Measurements: 2cm length x 2.6cm width x 0.1cm depth; 0.408cm^3 volume. Character of Wound/Ulcer Post Debridement is improved. Post procedure Diagnosis Wound #1: Same as Pre-Procedure General Notes: Scribed for Dr. Celine Ahr by Blanche East, RN. Plan Follow-up Appointments: Return Appointment in 1 week. - Dr. Celine Ahr Room 4 Monday 08/03/22 at 7:45 Anesthetic: (In clinic) Topical Lidocaine 5% applied to wound bed Bathing/  Shower/ Hygiene: Other Bathing/Shower/Hygiene Orders/Instructions: - Change dressing after bathing Edema Control - Lymphedema / SCD / Other: Other Edema Control Orders/Instructions: - Keep doing the Lymphadema checks Hyperbaric Oxygen Therapy: Evaluate for HBO Therapy Indication: - soft tissue radionecrosis If appropriate for treatment, begin HBOT per protocol: 2.0 ATA for 90 Minutes without Air Breaks T Number of Treatments: - 40 otal One treatments per day (delivered Monday through Friday unless otherwise specified in Special Instructions below): Finger stick Blood Glucose Pre- and Post- HBOT Treatment. Follow Hyperbaric Oxygen Glycemia Protocol Afrin (Oxymetazoline HCL) 0.05% nasal spray - 1 spray in both nostrils daily as needed prior to HBO treatment for difficulty clearing ears WOUND #1: - Breast Wound Laterality: Left Cleanser:  Normal Saline (Generic) 1 x Per Day/30 Days Discharge Instructions: Cleanse the wound with Normal Saline prior to applying a clean dressing using gauze sponges, not tissue or cotton balls. Cleanser: Soap and Water 1 x Per Day/30 Days Discharge Instructions: May shower and wash wound with dial antibacterial soap and water prior to dressing change. Cleanser: Byram Ancillary Kit - 15 Day Supply (Generic) 1 x Per Day/30 Days Discharge Instructions: Use supplies as instructed; Kit contains: (15) Saline Bullets; (15) 3x3 Gauze; 15 pr Gloves Prim Dressing: Promogran Prisma Matrix, 4.34 (sq in) (silver collagen) (DME) (Dispense As Written) 1 x Per Day/30 Days ary Discharge Instructions: Moisten collagen with saline or hydrogel Secondary Dressing: Zetuvit Plus Silicone Border Dressing 5x5 (in/in) (DME) (Dispense As Written) 1 x Per Day/30 Days Discharge Instructions: Apply silicone border over primary dressing as directed. 07/27/2022: The cavity has filled in considerably and the surface of the wound is very clean with just a thin layer of light slough. [name], [name] ([id number]) [id number] Page 8 of 9 I used a curette to debride the slough from the wound surface. I think at this point, we can discontinue using Santyl. We will switch to Prisma silver collagen, which she can pack into the residual cavity, as well as apply it to the wound surface. Hopefully her hyperbaric oxygen therapy will be approved by the end of the week. Follow-up with me in 1 week's time. Electronic Signature(s) Signed: 07/27/2022 9:47:22 AM By: [name] MD FACS Entered By: [name] on 07/27/2022 09:47:22 -------------------------------------------------------------------------------- HxROS Details Patient Name: Date of Service: [name], [name] 07/27/2022 9:15 A M Medical Record Number: [id number] Patient Account Number: [id number] Date of Birth/Sex: Treating RN: 08-04-1970 (52 y.o. F) Primary Care Provider: [name] Other Clinician: Referring Provider: Treating Provider/Extender: Shea Stakes in Treatment: 13 Information Obtained From Patient Ear/Nose/Mouth/Throat Medical History: Past Medical History Notes: Left ear limited hearing Hematologic/Lymphatic Medical History: Positive for: Lymphedema - Left breast-gets Lymphadema tx Gastrointestinal Medical History: Past Medical History Notes: GERD Immunological Medical History: Past Medical History Notes: Wegener"s Granulomatosis Musculoskeletal Medical History: Past Medical History Notes: "RA like arthritis" as per patient Oncologic Medical History: Past Medical History Notes: Left Breast. 12/08/21- 01/29/22- 37 rounds of Radiation Psychiatric Medical History: Past Medical History Notes: General Anxiety Immunizations Pneumococcal Vaccine: Received Pneumococcal Vaccination: Yes Received Pneumococcal Vaccination On or After 60th BirthdayBURNA, [name] ([id number]) [id number] Page 9 of  9 Implantable Devices Yes Hospitalization / Surgery History Type of Hospitalization/Surgery 03/25/22 Left Breast cyst excision; 11/12/21 Left Breast Lumpectomy with Radioactive seeds;Left breat Biopsy;Anterior Family and Social History Never smoker; Marital Status - Married; Alcohol Use: Moderate; Drug Use: No History; Caffeine Use: Daily; Financial Concerns: No; Food, Clothing or Shelter Needs: No; Support System Lacking: No; Transportation Concerns: No Electronic Signature(s) Signed: 07/27/2022 11:23:06 AM By: [name] MD FACS Entered By: [name] on 07/27/2022 09:41:56 -------------------------------------------------------------------------------- SuperBill Details Patient Name: Date of Service: [name] 07/27/2022 Medical Record Number: [id number] Patient Account Number: [id number] Date of Birth/Sex: Treating RN: September 28, 1970 (52 y.o. 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 Facility Procedures :  CPT4 Code: [id number] Description: 34035 - DEBRIDE WOUND 1ST 20 SQ CM OR < ICD-10 Diagnosis Description S21.002D Unspecified open wound of left breast, subsequent encounter Modifier: Quantity: 1 Physician Procedures : CPT4 Code Description Modifier [id number] 99214 - WC PHYS LEVEL 4 - EST PT 25 ICD-10 Diagnosis Description T81.31XD Disruption of external operation (surgical) wound, not elsewhere classified, subsequent encounter S21.002D Unspecified open wound of left  breast, subsequent  encounter 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

## 2022-07-30 ENCOUNTER — Encounter (HOSPITAL_BASED_OUTPATIENT_CLINIC_OR_DEPARTMENT_OTHER): Payer: 59 | Admitting: General Surgery

## 2022-07-30 DIAGNOSIS — T8131XA Disruption of external operation (surgical) wound, not elsewhere classified, initial encounter: Secondary | ICD-10-CM | POA: Diagnosis not present

## 2022-07-30 NOTE — Progress Notes (Signed)
[name], [name] ([id number]) [id number] Page 1 of 2 Visit Report for 07/30/2022 Arrival Information Details Patient Name: Date of Service: [name] 07/30/2022 10:00 A M Medical Record Number: [id number] Patient Account Number: [id number] Date of Birth/Sex: Treating RN: Jul 13, 1970 (52 y.o. [name], Jamie Primary Care Jhordan Mckibben: [name] Other Clinician: Valeria Batman Referring Jenilee Franey: Treating Oliviana Mcgahee/Extender: Shea Stakes in Treatment: 72 Visit Information History Since Last Visit All ordered tests and consults were completed: Yes Patient Arrived: Ambulatory Added or deleted any medications: No Arrival Time: 09:25 Any new allergies or adverse reactions: No Accompanied By: None Had a fall or experienced change in No Transfer Assistance: None activities of daily living that may affect Patient Identification Verified: Yes risk of falls: Secondary Verification Process Completed: Yes Signs or symptoms of abuse/neglect since last visito No Patient Requires Transmission-Based Precautions: No Hospitalized since last visit: No Patient Has Alerts: No Implantable device outside of the clinic excluding No cellular tissue based products placed in the center since last visit: Pain Present Now: No Electronic Signature(s) Signed: 07/30/2022 4:22:55 PM By: Valeria Batman EMT Entered By: Valeria Batman on 07/30/2022 16:22:55 -------------------------------------------------------------------------------- Encounter Discharge Information Details Patient Name: Date of Service: [name], [name] 07/30/2022 10:00 A M Medical Record Number: [id number] Patient Account Number: [id number] Date of Birth/Sex: Treating RN: 08-05-1970 (52 y.o. [name] Primary Care Chontel Warning: [name] Other Clinician: Valeria Batman Referring Analeese Andreatta: Treating Ladarien Beeks/Extender: Shea Stakes in  Treatment: 59 Encounter Discharge Information Items Discharge Condition: Stable Ambulatory Status: Ambulatory Discharge Destination: Home Transportation: Private Auto Accompanied By: None Schedule Follow-up Appointment: Yes Clinical Summary of Care: Electronic Signature(s) Signed: 07/30/2022 4:48:45 PM By: Valeria Batman EMT Entered By: Valeria Batman on 07/30/2022 16:48:45 [name] ([id number]) [id number] Page 2 of 2 -------------------------------------------------------------------------------- Vitals Details Patient Name: Date of Service: [name], [name] 07/30/2022 10:00 A M Medical Record Number: [id number] Patient Account Number: [id number] Date of Birth/Sex: Treating RN: 1970/08/17 (52 y.o. [name], Jamie Primary Care Markhi Kleckner: [name] Other Clinician: Valeria Batman Referring Saphyre Cillo: Treating Ural Acree/Extender: [name], [name] Weeks in Treatment: 13 Vital Signs Time Taken: 09:57 Temperature (F): 98.0 Height (in): 63 Pulse (bpm): 78 Weight (lbs): 170 Respiratory Rate (breaths/min): 16 Body Mass Index (BMI): 30.1 Blood Pressure (mmHg): 119/75 Reference Range: 80 - 120 mg / dl Electronic Signature(s) Signed: 07/30/2022 4:23:29 PM By: Valeria Batman EMT Entered By: Valeria Batman on 07/30/2022 16:23:29

## 2022-07-30 NOTE — Progress Notes (Addendum)
[name] ([id number]) [id number] Page 1 of 2 Visit Report for 07/30/2022 HBO Details Patient Name: Date of Service: [name], [name] 07/30/2022 10:00 A M Medical Record Number: [id number] Patient Account Number: [id number] Date of Birth/Sex: Treating RN: 07-10-1970 (52 y.o. 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 HBO Treatment Details Treatment Number: 1 Patient Type: Outpatient Chamber Type: Monoplace Chamber Serial #: [id number] Treatment Protocol: 2.0 ATA with 90 minutes oxygen, and no air breaks Treatment Details Compression Rate Down: 1.0 psi / minute De-Compression Rate Up: 1.0 psi / minute Air breaks and breathing Decompress Decompress Compress Tx Pressure Begins Reached periods Begins Ends (leave unused spaces blank) Chamber Pressure (ATA 1 2 ------2 1 ) Clock Time (24 hr) 10:05 10:18 - - - - - - 11:48 12:04 Treatment Length: 119 (minutes) Treatment Segments: 4 Vital Signs Capillary Blood Glucose Reference Range: 80 - 120 mg / dl HBO Diabetic Blood Glucose Intervention Range: <131 mg/dl or >249 mg/dl Time Vitals Blood Respiratory Capillary Blood Glucose Pulse Action Type: Pulse: Temperature: Taken: Pressure: Rate: Glucose (mg/dl): Meter #: Oximetry (%) Taken: Pre 09:57 119/75 78 16 98 Post 12:08 117/78 81 16 98 Treatment Response Treatment Toleration: Well Treatment Completion Status: Treatment Completed without Adverse Event Treatment Notes Today was the patient first HBO treatment. She was seen by Dr. Celine Ahr before treatment. No problems noted during  treatment. Physician HBO Attestation: I certify that I supervised this HBO treatment in accordance with Medicare guidelines. A trained emergency response team is readily available per Yes hospital policies and procedures. Continue HBOT as ordered. Yes Electronic Signature(s) Signed: 07/31/2022 7:41:34 AM By: [name] Maudlin MD FACS Previous Signature: 07/30/2022 4:29:54 PM Version By: [name] EMT Entered By: [name] on 07/31/2022 07:41:34 [name] ([id number]) [id number] Page 2 of 2 -------------------------------------------------------------------------------- HBO Safety Checklist Details Patient Name: Date of Service: [name] [name] 07/30/2022 10:00 A M Medical Record Number: [id number] Patient Account Number: [id number] Date of Birth/Sex: Treating RN: 1970/08/15 (52 y.o. [name], [place] Primary Care Gerold Sar: [name] Other Clinician: Valeria [name] Referring Felecia Stanfill: Treating Oneil Behney/Extender: [name], [name] Weeks in Treatment: 13 HBO Safety Checklist Items Safety Checklist Consent Form Signed Patient voided / foley secured and emptied When did you last eato 0830 Last dose of injectable or oral agent NA Ostomy pouch emptied and vented if applicable NA All implantable devices assessed, documented and approved NA Intravenous access site secured and place NA Valuables secured Linens and cotton and cotton/polyester blend (less than 51% polyester) Personal oil-based products / skin lotions / body lotions removed Wigs or hairpieces removed Smoking or tobacco materials removed Books / newspapers / magazines / loose paper removed Cologne, aftershave, perfume and deodorant removed Jewelry removed (may wrap wedding band) Make-up removed Hair care products removed Battery operated devices (external) removed Heating patches and chemical warmers removed Titanium eyewear removed NA Nail polish cured  greater than 10 hours Casting material cured greater than 10 hours NA Hearing aids removed NA Loose dentures or partials removed NA Prosthetics have been removed NA Patient demonstrates correct use of air break device (if applicable) Patient concerns have been addressed Patient grounding bracelet on and cord attached to chamber Specifics for Inpatients (complete  in addition to above) Medication sheet sent with patient NA Intravenous medications needed or due during therapy sent with patient NA Drainage tubes (e.g. nasogastric tube or chest tube secured and vented) NA Endotracheal or Tracheotomy tube secured NA Cuff deflated of air and inflated with saline NA Airway suctioned NA Notes The safety checklist was done before the treatment was started. Electronic Signature(s) Signed: 07/30/2022 4:25:13 PM By: [name] EMT Entered By: [name] on 07/30/2022 16:25:12

## 2022-07-31 ENCOUNTER — Encounter (HOSPITAL_BASED_OUTPATIENT_CLINIC_OR_DEPARTMENT_OTHER): Payer: 59 | Admitting: General Surgery

## 2022-07-31 DIAGNOSIS — T8131XA Disruption of external operation (surgical) wound, not elsewhere classified, initial encounter: Secondary | ICD-10-CM | POA: Diagnosis not present

## 2022-07-31 NOTE — Progress Notes (Signed)
CAFFIE, SOTTO ([id number]) [id number] Page 1 of 2 Visit Report for 07/31/2022 Arrival Information Details Patient Name: Date of Service: [name] 07/31/2022 10:00 A M Medical Record Number: [id number] Patient Account Number: [id number] Date of Birth/Sex: Treating RN: 04/25/1971 (52 y.o. [name] Dorise Gangi: [name] Other Clinician: Valeria Batman Referring Chee Dimon: Treating Joanny Dupree/Extender: Shea Stakes in Treatment: 14 Visit Information History Since Last Visit All ordered tests and consults were completed: Yes Patient Arrived: Ambulatory Added or deleted any medications: No Arrival Time: 09:37 Any new allergies or adverse reactions: No Accompanied By: None Had a fall or experienced change in No Transfer Assistance: None activities of daily living that may affect Patient Requires Transmission-Based Precautions: No risk of falls: Patient Has Alerts: No Signs or symptoms of abuse/neglect since last visito No Hospitalized since last visit: No Implantable device outside of the clinic excluding No cellular tissue based products placed in the center since last visit: Pain Present Now: No Electronic Signature(s) Signed: 07/31/2022 4:34:08 PM By: Valeria Batman EMT Entered By: Valeria Batman on 07/31/2022 16:34:08 -------------------------------------------------------------------------------- Encounter Discharge Information Details Patient Name: Date of Service: [name], [name] Bushy NIE W. 07/31/2022 10:00 A M Medical Record Number: [id number] Patient Account Number: [id number] Date of Birth/Sex: Treating RN: 1970-09-15 (52 y.o. [name] Telina Kleckley: [name] Other Clinician: Valeria Batman Referring Qusai Kem: Treating Wade Sigala/Extender: Shea Stakes in Treatment: 45 Encounter Discharge Information Items Discharge Condition:  Stable Ambulatory Status: Ambulatory Discharge Destination: Home Transportation: Private Auto Accompanied By: None Schedule Follow-up Appointment: Yes Clinical Summary of Care: Electronic Signature(s) Signed: 07/31/2022 4:39:25 PM By: Valeria Batman EMT Entered By: Valeria Batman on 07/31/2022 16:39:25 [name] ([id number]) [id number] Page 2 of 2 -------------------------------------------------------------------------------- Vitals Details Patient Name: Date of Service: [name], [name] 07/31/2022 10:00 A M Medical Record Number: [id number] Patient Account Number: [id number] Date of Birth/Sex: Treating RN: 01-Jul-1971 (52 y.o. [name] Burnice Vassel: [name] Other Clinician: Valeria Batman Referring Jaimes Eckert: Treating Kale Rondeau/Extender: [name], [name] Weeks in Treatment: 14 Vital Signs Time Taken: 10:02 Temperature (F): 97.1 Height (in): 63 Pulse (bpm): 76 Weight (lbs): 170 Respiratory Rate (breaths/min): 18 Body Mass Index (BMI): 30.1 Blood Pressure (mmHg): 119/82 Reference Range: 80 - 120 mg / dl Electronic Signature(s) Signed: 07/31/2022 4:34:33 PM By: Valeria Batman EMT Entered By: Valeria Batman on 07/31/2022 16:34:33

## 2022-07-31 NOTE — Progress Notes (Signed)
LASONJA, LAKINS ([id number]) [id number] Page 1 of 2 Visit Report for 07/30/2022 Problem List Details Patient Name: Date of Service: [name] 07/30/2022 10:00 A M Medical Record Number: [id number] Patient Account Number: [id number] Date of Birth/Sex: Treating RN: 14-Aug-1970 (52 y.o. 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 07/30/2022 Medical Record Number: [id number] Patient Account Number: [id number] Date of Birth/Sex: Treating RN: March 12, 1971 (52 y.o. 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 Facility Procedures : CPT4 Code Description: [id number] G0277-(Facility Use Only) HBOT full body chamber, 60mn , ICD-10 Diagnosis Description L59.8 Other specified disorders of the skin and subcutaneous tissue related to rad S21.002D Unspecified open wound of left breast,  subsequent encounter T81.31XD Disruption of external operation (surgical) wound, not elsewhere classified, 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 Modifier: iation subsequent encou Quantity: 4 nter Physician Procedures : CPT4 Code Description Modifier [id number] 53614- WC PHYS HYPERBARIC OXYGEN THERAPY ICD-10 Diagnosis Description 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 S21.002D Unspecified open wound of left breast,  subsequent encounter T81.31XD Disruption of external operation (surgical) wound, not elsewhere classified, subsequent enco 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

## 2022-07-31 NOTE — Progress Notes (Signed)
JOHARI, BENNETTS ([id number]) [id number] Page 1 of 2 Visit Report for 07/31/2022 HBO Details Patient Name: Date of Service: [name], [name] 07/31/2022 10:00 A M Medical Record Number: [id number] Patient Account Number: [id number] Date of Birth/Sex: Treating RN: 08/19/1970 (52 y.o. 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 HBO Treatment Details Treatment Number: 2 Patient Type: Outpatient Chamber Type: Monoplace Chamber Serial #: [id number] Treatment Protocol: 2.0 ATA with 90 minutes oxygen, and no air breaks Treatment Details Compression Rate Down: 1.0 psi / minute De-Compression Rate Up: 1.0 psi / minute Air breaks and breathing Decompress Decompress Compress Tx Pressure Begins Reached periods Begins Ends (leave unused spaces blank) Chamber Pressure (ATA 1 2 ------2 1 ) Clock Time (24 hr) 10:02 10:23 - - - - - - 11:53 12:03 Treatment Length: 121 (minutes) Treatment Segments: 4 Vital Signs Capillary Blood Glucose Reference Range: 80 - 120 mg / dl HBO Diabetic Blood Glucose Intervention Range: <131 mg/dl or >249 mg/dl Time Vitals Blood Respiratory Capillary Blood Glucose Pulse Action Type: Pulse: Temperature: Taken: Pressure: Rate: Glucose (mg/dl): Meter #: Oximetry (%) Taken: Pre 10:02 119/82 76 18 97.1 Post 12:05 127/75 80 16 97.8 Treatment Response Treatment Toleration: Well Treatment Completion Status: Treatment Completed without Adverse Event Physician HBO Attestation: I certify that I supervised this HBO treatment in accordance with Medicare guidelines. A trained  emergency response team is readily available per Yes hospital policies and procedures. Continue HBOT as ordered. Yes Electronic Signature(s) Signed: 08/03/2022 8:19:10 AM By: [name] MD FACS Previous Signature: 07/31/2022 4:38:01 PM Version By: Valeria Batman EMT Entered By: [name] on 08/03/2022 08:19:10 [name] ([id number]) [id number] Page 2 of 2 -------------------------------------------------------------------------------- HBO Safety Checklist Details Patient Name: Date of Service: [name] [name] 07/31/2022 10:00 A M Medical Record Number: [id number] Patient Account Number: [id number] Date of Birth/Sex: Treating RN: 10/23/1970 (52 y.o. [name] Mry Lamia: [name] Other Clinician: Valeria Batman Referring Graylen Noboa: Treating Gottfried Standish/Extender: [name], [name] Weeks in Treatment: 14 HBO Safety Checklist Items Safety Checklist Consent Form Signed Patient voided / foley secured and emptied When did you last eato 0615 Last dose of injectable or oral agent NA Ostomy pouch emptied and vented if applicable NA All implantable devices assessed, documented and approved NA Intravenous access site secured and place NA Valuables secured Linens and cotton and cotton/polyester blend (less than 51% polyester) Personal oil-based products / skin lotions / body lotions removed Wigs or hairpieces removed Smoking or tobacco materials removed Books / newspapers / magazines / loose paper removed Cologne, aftershave, perfume and deodorant removed Jewelry removed (may wrap wedding band) Make-up removed Hair care products removed Battery operated devices (external) removed Heating patches and chemical warmers removed Titanium eyewear removed NA Nail polish cured greater than 10 hours 2 weeks age Casting material cured greater than 10 hours NA Hearing aids removed NA Loose dentures or  partials removed NA Prosthetics have been removed NA Patient demonstrates correct use of air break device (if applicable) Patient concerns have been addressed Patient grounding bracelet on and cord attached to chamber Specifics for Inpatients (complete in addition to above) Medication sheet sent with patient NA Intravenous medications needed or due during therapy sent with  patient NA Drainage tubes (e.g. nasogastric tube or chest tube secured and vented) NA Endotracheal or Tracheotomy tube secured NA Cuff deflated of air and inflated with saline NA Airway suctioned NA Notes The safety checklist was done before the treatment was started. Electronic Signature(s) Signed: 07/31/2022 4:35:48 PM By: Valeria Batman EMT Entered By: Valeria Batman on 07/31/2022 16:35:47

## 2022-08-03 ENCOUNTER — Encounter (HOSPITAL_BASED_OUTPATIENT_CLINIC_OR_DEPARTMENT_OTHER): Payer: 59 | Admitting: General Surgery

## 2022-08-03 DIAGNOSIS — C50812 Malignant neoplasm of overlapping sites of left female breast: Secondary | ICD-10-CM | POA: Diagnosis not present

## 2022-08-03 DIAGNOSIS — T8131XA Disruption of external operation (surgical) wound, not elsewhere classified, initial encounter: Secondary | ICD-10-CM | POA: Diagnosis not present

## 2022-08-03 NOTE — Progress Notes (Signed)
[name], [name] ([id number]) [id number] Page 1 of 6 Visit Report for 08/03/2022 Arrival Information Details Patient Name: Date of Service: [name] 08/03/2022 7:45 A M Medical Record Number: [id number] Patient Account Number: [id number] Date of Birth/Sex: Treating RN: Jul 05, 1971 (52 y.o. [name], [place] Primary Care [name]: [name] Other Clinician: Referring [name]: Treating [name]/Extender: [name] in Treatment: 14 Visit Information History Since Last Visit Added or deleted any medications: No Patient Arrived: Ambulatory Any new allergies or adverse reactions: No Arrival Time: 07:52 Had a fall or experienced change in No Accompanied By: self activities of daily living that may affect Transfer Assistance: None risk of falls: Patient Requires Transmission-Based Precautions: No Signs or symptoms of abuse/neglect since last visito No Patient Has Alerts: No Hospitalized since last visit: No Implantable device outside of the clinic excluding No cellular tissue based products placed in the center since last visit: Has Dressing in Place as Prescribed: Yes Pain Present Now: No Electronic Signature(s) Signed: 08/03/2022 4:55:35 PM By: [name] East RN Entered By: [name] on 08/03/2022 07:52:33 -------------------------------------------------------------------------------- Encounter Discharge Information Details Patient Name: Date of Service: [name], [name] Bushy NIE W. 08/03/2022 7:45 A M Medical Record Number: [id number] Patient Account Number: [id number] Date of Birth/Sex: Treating RN: [birth year] (52 y.o. [name] Primary Care [name]: [name] Other Clinician: Referring [name]: Treating [name]/Extender: [name] in Treatment: 109 Encounter Discharge Information Items Post Procedure Vitals Discharge Condition: Stable Temperature (F): 98.5 Ambulatory  Status: Ambulatory Pulse (bpm): 85 Discharge Destination: Home Respiratory Rate (breaths/min): 16 Transportation: Private Auto Blood Pressure (mmHg): 146/88 Accompanied By: self Schedule Follow-up Appointment: Yes Clinical Summary of Care: Electronic Signature(s) Signed: 08/03/2022 4:55:35 PM By: [name] East RN Entered By: [name] on 08/03/2022 08:14:29 Marcello [name] ([id number]) [id number] Page 2 of 6 -------------------------------------------------------------------------------- Lower Extremity Assessment Details Patient Name: Date of Service: [name] 08/03/2022 7:45 A M Medical Record Number: [id number] Patient Account Number: [id number] Date of Birth/Sex: Treating RN: October 10, 1970 (52 y.o. [name] Primary Care [name]: [name] Other Clinician: Referring [name]: Treating [name]/Extender: [name], [name] in Treatment: 14 Electronic Signature(s) Signed: 08/03/2022 4:55:35 PM By: [name] East RN Entered By: [name] on 08/03/2022 07:53:00 -------------------------------------------------------------------------------- Multi Wound Chart Details Patient Name: Date of Service: [name], [name] 08/03/2022 7:45 A M Medical Record Number: [id number] Patient Account Number: [id number] Date of Birth/Sex: Treating RN: 1970/08/30 (52 y.o. F) Primary Care [name]: [name] Other Clinician: Referring Kalise Fickett: Treating Albena Comes/Extender: [name], [name] in Treatment: 14 Vital Signs Height(in): 63 Pulse(bpm): 85 Weight(lbs): 170 Blood Pressure(mmHg): 146/88 Body Mass Index(BMI): 30.1 Temperature(F): 98.5 Respiratory Rate(breaths/min): 16 [1:Photos:] [N/A:N/A] Left Breast N/A N/A Wound Location: Surgical Injury N/A N/A Wounding Event: Malignant Wound N/A N/A Primary Etiology: Lymphedema N/A N/A Comorbid History: 04/03/2022 N/A N/A Date Acquired: 14  N/A N/A [name] of Treatment: Open N/A N/A Wound Status: No N/A N/A Wound Recurrence: 1.8x1.7x0.1 N/A N/A Measurements L x W x D (cm) 2.403 N/A N/A A (cm) : rea 0.24 N/A N/A Volume (cm) : 82.00% N/A N/A % Reduction in A rea: 95.50% N/A N/A % Reduction in Volume: Full Thickness Without Exposed N/A N/A Classification: Support Structures Medium N/A N/A Exudate Amount: Serosanguineous N/A N/A Exudate Type: red, brown N/A N/A Exudate Color: Distinct, outline attached N/A N/A Wound Margin: Large (67-100%) N/A N/A Granulation Amount: Red N/A N/A Granulation Quality: Small (1-33%) N/A N/A Necrotic Amount: [name]  W ([id number]) [id number] Page 3 of 6 Fat Layer (Subcutaneous Tissue): Yes N/A N/A Exposed Structures: Fascia: No Tendon: No Muscle: No Joint: No Bone: No Small (1-33%) N/A N/A Epithelialization: Debridement - Selective/Open Wound N/A N/A Debridement: Pre-procedure Verification/Time Out 08:01 N/A N/A Taken: Lidocaine 5% topical ointment N/A N/A Pain Control: Slough N/A N/A Tissue Debrided: Non-Viable Tissue N/A N/A Level: 3.06 N/A N/A Debridement A (sq cm): rea Curette N/A N/A Instrument: Minimum N/A N/A Bleeding: Pressure N/A N/A Hemostasis A chieved: Procedure was tolerated well N/A N/A Debridement Treatment Response: 1.8x1.7x0.1 N/A N/A Post Debridement Measurements L x W x D (cm) 0.24 N/A N/A Post Debridement Volume: (cm) Induration: Yes N/A N/A Periwound Skin Texture: No Abnormalities Noted N/A N/A Periwound Skin Moisture: No Abnormalities Noted N/A N/A Periwound Skin Color: No Abnormality N/A N/A Temperature: Debridement N/A N/A Procedures Performed: Treatment Notes Electronic Signature(s) Signed: 08/03/2022 8:09:26 AM By: [name] Maudlin MD FACS Entered By: [name] on 08/03/2022  08:09:26 -------------------------------------------------------------------------------- Multi-Disciplinary Care Plan Details Patient Name: Date of Service: [name], [name] Bushy NIE W. 08/03/2022 7:45 A M Medical Record Number: [id number] Patient Account Number: [id number] Date of Birth/Sex: Treating RN: 1970-09-04 (52 y.o. [name] Primary Care Sahiba Granholm: [name] Other Clinician: Referring Monik Lins: Treating Maanvi Lecompte/Extender: [name] in Treatment: Monon reviewed with physician Active Inactive Wound/Skin Impairment Nursing Diagnoses: Impaired tissue integrity Goals: Patient/caregiver will verbalize understanding of skin care regimen Date Initiated: 04/24/2022 Target Resolution Date: 08/07/2022 Goal Status: Active Interventions: Assess ulceration(s) every visit Treatment Activities: Skin care regimen initiated : 04/24/2022 Notes: Electronic Signature(s) Signed: 08/03/2022 4:55:35 PM By: [name] East RN Entered By: [name] on 08/03/2022 08:12:15 Marcello [name] ([id number]) [id number] Page 4 of 6 -------------------------------------------------------------------------------- Pain Assessment Details Patient Name: Date of Service: [name] 08/03/2022 7:45 A M Medical Record Number: [id number] Patient Account Number: [id number] Date of Birth/Sex: Treating RN: 1970/12/19 (52 y.o. [name] Primary Care Jarel Cuadra: [name] Other Clinician: Referring Hades Mathew: Treating Keiosha Cancro/Extender: [name], [name] in Treatment: 14 Active Problems Location of Pain Severity and Description of Pain Patient Has Paino No Site Locations Rate the pain. Current Pain Level: 0 Pain Management and Medication Current Pain Management: Electronic Signature(s) Signed: 08/03/2022 4:55:35 PM By: [name] East RN Entered By: [name] on 08/03/2022  07:52:55 -------------------------------------------------------------------------------- Patient/Caregiver Education Details Patient Name: Date of Service: [name] 1/29/2024andnbsp7:45 A M Medical Record Number: [id number] Patient Account Number: [id number] Date of Birth/Gender: Treating RN: 10/14/1970 (52 y.o. [name] Primary Care Physician: [name] Other Clinician: Referring Physician: Treating Physician/Extender: [name] in Treatment: 14 Education Assessment Education Provided To: Patient Education Topics Provided Wound/Skin Impairment: Methods: Explain/Verbal Responses: Reinforcements needed, State content correctly SOPHY, MESLER ([id number]) [id number] Page 5 of 6 Electronic Signature(s) Signed: 08/03/2022 4:55:35 PM By: [name] East RN Entered By: [name] on 08/03/2022 08:13:25 -------------------------------------------------------------------------------- Wound Assessment Details Patient Name: Date of Service: [name]. 08/03/2022 7:45 A M Medical Record Number: [id number] Patient Account Number: [id number] Date of Birth/Sex: Treating RN: Jun 30, 1971 (52 y.o. [name] Primary Care Nataya Bastedo: [name] Other Clinician: Referring Jestina Stephani: Treating Nethaniel Mattie/Extender: [name], [name] in Treatment: 14 Wound Status Wound Number: 1 Primary Etiology: Malignant Wound Wound Location: Left Breast Wound Status: Open Wounding Event: Surgical Injury Comorbid History: Lymphedema Date Acquired: 04/03/2022 [name] Of Treatment: 14 Clustered Wound: No Photos Wound Measurements Length: (cm) 1.8 Width: (cm) 1.7  Depth: (cm) 0.1 Area: (cm) 2.403 Volume: (cm) 0.24 % Reduction in Area: 82% % Reduction in Volume: 95.5% Epithelialization: Small (1-33%) Tunneling: No Undermining: No Wound Description Classification: Full Thickness Without  Exposed Suppor Wound Margin: Distinct, outline attached Exudate Amount: Medium Exudate Type: Serosanguineous Exudate Color: red, brown t Structures Foul Odor After Cleansing: No Slough/Fibrino Yes Wound Bed Granulation Amount: Large (67-100%) Exposed Structure Granulation Quality: Red Fascia Exposed: No Necrotic Amount: Small (1-33%) Fat Layer (Subcutaneous Tissue) Exposed: Yes Necrotic Quality: Adherent Slough Tendon Exposed: No Muscle Exposed: No Joint Exposed: No Bone Exposed: No Periwound Skin Texture Texture Color No Abnormalities Noted: No No Abnormalities Noted: Yes Induration: Yes Temperature / Pain Temperature: No [address] VERNETA, HAMIDI ([id number]) [id number] Page 6 of 6 No Abnormalities Noted: Yes Treatment Notes Wound #1 (Breast) Wound Laterality: Left Cleanser Normal Saline Discharge Instruction: Cleanse the wound with Normal Saline prior to applying a clean dressing using gauze sponges, not tissue or cotton balls. Soap and Water Discharge Instruction: May shower and wash wound with dial antibacterial soap and water prior to dressing change. [place] Ancillary Kit - 15 Day Supply Discharge Instruction: Use supplies as instructed; Kit contains: (15) Saline Bullets; (15) 3x3 Gauze; 15 pr Gloves Peri-Wound Care Topical Primary Dressing Promogran Prisma Matrix, 4.34 (sq in) (silver collagen) Discharge Instruction: Moisten collagen with saline or hydrogel Secondary Dressing Zetuvit Plus Silicone Border Dressing 5x5 (in/in) Discharge Instruction: Apply silicone border over primary dressing as directed. Secured With Compression Wrap Compression Stockings Environmental education officer) Signed: 08/03/2022 4:55:35 PM By: [name] East RN Entered By: [name] on 08/03/2022 07:57:41 -------------------------------------------------------------------------------- Vitals Details Patient Name: Date of Service: [name],  [place] W. 08/03/2022 7:45 A M Medical Record Number: [id number] Patient Account Number: [id number] Date of Birth/Sex: Treating RN: [birth year] (52 y.o. [name], Jamie Primary Care Kinberly Perris: [name] Other Clinician: Referring Kearstyn Avitia: Treating Clancey Welton/Extender: [name], [name] in Treatment: 14 Vital Signs Time Taken: 07:49 Temperature (F): 98.5 Height (in): 63 Pulse (bpm): 85 Weight (lbs): 170 Respiratory Rate (breaths/min): 16 Body Mass Index (BMI): 30.1 Blood Pressure (mmHg): 146/88 Reference Range: 80 - 120 mg / dl Electronic Signature(s) Signed: 08/03/2022 4:55:35 PM By: [name] East RN Entered By: [name] on 08/03/2022 07:52:49

## 2022-08-03 NOTE — Progress Notes (Signed)
[name], [name] ([id number]) [id number] Page 1 of 2 Visit Report for 08/03/2022 Problem List Details Patient Name: Date of Service: [name], [name] 08/03/2022 10:00 A M Medical Record Number: [id number] Patient Account Number: [id number] Date of Birth/Sex: Treating RN: [birth year] (52 y.o. 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 08/03/2022 Medical Record Number: [id number] Patient Account Number: [id number] Date of Birth/Sex: Treating RN: 1970-08-13 (52 y.o. 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 Facility Procedures : CPT4 Code Description: [id number]-(Facility Use Only) HBOT full body chamber, 60mn , ICD-10 Diagnosis Description L59.8 Other specified disorders of the skin and subcutaneous tissue related to rad S21.002D Unspecified open wound of left breast,  subsequent encounter 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 T81.31XD Disruption of external operation (surgical) wound, not elsewhere classified, Modifier: iation subsequent encou Quantity: 4 nter Physician Procedures : CPT4 Code Description Modifier [id number] PHYS HYPERBARIC OXYGEN THERAPY ICD-10 Diagnosis Description 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

## 2022-08-03 NOTE — Progress Notes (Signed)
PERRY, MOLLA ([id number]) [id number] Page 1 of 2 Visit Report for 08/03/2022 Arrival Information Details Patient Name: Date of Service: [name], [name] 08/03/2022 10:00 A M Medical Record Number: [id number] Patient Account Number: [id number] Date of Birth/Sex: Treating RN: March 23, 1971 (52 y.o. [name], Meta.Reding Primary Care Caralyn Twining: [name] Other Clinician: Valeria Batman Referring Falon Flinchum: Treating Filbert Craze/Extender: Shea Stakes in Treatment: 14 Visit Information History Since Last Visit All ordered tests and consults were completed: Yes Patient Arrived: Ambulatory Added or deleted any medications: No Arrival Time: 07:34 Any new allergies or adverse reactions: No Accompanied By: None Had a fall or experienced change in No Transfer Assistance: None activities of daily living that may affect Patient Identification Verified: Yes risk of falls: Secondary Verification Process Completed: Yes Signs or symptoms of abuse/neglect since last visito No Patient Requires Transmission-Based Precautions: No Hospitalized since last visit: No Patient Has Alerts: No Implantable device outside of the clinic excluding No cellular tissue based products placed in the center since last visit: Pain Present Now: No Electronic Signature(s) Signed: 08/03/2022 2:41:27 PM By: Valeria Batman EMT Entered By: Valeria Batman on 08/03/2022 14:41:27 -------------------------------------------------------------------------------- Encounter Discharge Information Details Patient Name: Date of Service: [name], [name] Bushy NIE W. 08/03/2022 10:00 A M Medical Record Number: [id number] Patient Account Number: [id number] Date of Birth/Sex: Treating RN: 1971/03/13 (52 y.o. [name] Primary Care Bhavya Grand: [name] Other Clinician: Valeria Batman Referring Cynde Menard: Treating Francis Yardley/Extender: Shea Stakes in  Treatment: 30 Encounter Discharge Information Items Discharge Condition: Stable Ambulatory Status: Ambulatory Discharge Destination: Home Transportation: Private Auto Accompanied By: None Schedule Follow-up Appointment: Yes Clinical Summary of Care: Electronic Signature(s) Signed: 08/03/2022 2:46:05 PM By: Valeria Batman EMT Entered By: Valeria Batman on 08/03/2022 14:46:04 [name] ([id number]) [id number] Page 2 of 2 -------------------------------------------------------------------------------- Vitals Details Patient Name: Date of Service: [name], [name] 08/03/2022 10:00 A M Medical Record Number: [id number] Patient Account Number: [id number] Date of Birth/Sex: Treating RN: 10-24-1970 (52 y.o. [name], Meta.Reding Primary Care Genese Quebedeaux: [name] Other Clinician: Valeria Batman Referring Lambros Cerro: Treating Tayvian Holycross/Extender: [name], [name] Weeks in Treatment: 14 Vital Signs Time Taken: 09:55 Temperature (F): 97.6 Height (in): 63 Pulse (bpm): 72 Weight (lbs): 170 Respiratory Rate (breaths/min): 18 Body Mass Index (BMI): 30.1 Blood Pressure (mmHg): 121/76 Reference Range: 80 - 120 mg / dl Electronic Signature(s) Signed: 08/03/2022 2:41:50 PM By: Valeria Batman EMT Entered By: Valeria Batman on 08/03/2022 14:41:50

## 2022-08-03 NOTE — Progress Notes (Signed)
DAYANNARA, PASCAL ([id number]) [id number] Page 1 of 2 Visit Report for 07/31/2022 Problem List Details Patient Name: Date of Service: [name], [name] 07/31/2022 10:00 A M Medical Record Number: [id number] Patient Account Number: [id number] Date of Birth/Sex: Treating RN: 1970/10/08 (52 y.o. 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 07/31/2022 Medical Record Number: [id number] Patient Account Number: [id number] Date of Birth/Sex: Treating RN: December 22, 1970 (52 y.o. 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 Facility Procedures : CPT4 Code Description: [id number]-(Facility Use Only) HBOT full body chamber, 34mn , ICD-10 Diagnosis Description L59.8 Other specified disorders of the skin and subcutaneous tissue related to rad S21.002D Unspecified open wound of left breast,  subsequent encounter T81.31XD Disruption of external operation (surgical) wound, not elsewhere classified, 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 Modifier: iation subsequent encou Quantity: 4 nter Physician Procedures : CPT4 Code Description Modifier [id number] PHYS HYPERBARIC OXYGEN THERAPY ICD-10 Diagnosis Description 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

## 2022-08-03 NOTE — Progress Notes (Signed)
[name] ([id number]) [id number] Page 1 of 8 Visit Report for 08/03/2022 Chief Complaint Document Details Patient Name: Date of Service: [name] [name] 08/03/2022 7:45 A M Medical Record Number: [id number] Patient Account Number: [id number] Date of Birth/Sex: Treating RN: [birth year], [name] (52 y.o. F) Primary Care Provider: [name] Other Clinician: Referring Provider: Treating Provider/Extender: [name], [name] Weeks in Treatment: 14 Information Obtained from: Patient Chief Complaint 04/24/2022; patient is here for a surgical wound on her left lateral breast Electronic Signature(s) Signed: 08/03/2022 8:09:32 AM By: [name] MD FACS Entered By: [name] on 08/03/2022 08:09:32 -------------------------------------------------------------------------------- Debridement Details Patient Name: Date of Service: [name] 08/03/2022 7:45 A M Medical Record Number: [id number] Patient Account Number: [id number] Date of Birth/Sex: Treating RN: [name][birth year] (52 y.o. F) Primary Care Provider: [name] Other Clinician: Referring Provider: Treating Provider/Extender: [name], [name] Weeks in Treatment: 14 Debridement Performed for Assessment: Wound #1 Left Breast Performed By: Physician [name], MD Debridement Type: Debridement Level of Consciousness (Pre-procedure): Awake and Alert Pre-procedure Verification/Time Out Yes - 08:01 Taken: Start Time: 08:02 Pain Control: Lidocaine 5% topical ointment T Area Debrided (L x W): otal 1.8 (cm) x 1.7 (cm) = 3.06 (cm) Tissue and other material debrided: Non-Viable, Slough, Slough Level: Non-Viable Tissue Debridement Description: Selective/Open Wound Instrument: Curette Bleeding: Minimum Hemostasis Achieved: Pressure Response to Treatment: Procedure was tolerated well Level of Consciousness (Post- Awake and Alert procedure): Post  Debridement Measurements of Total Wound Length: (cm) 1.8 Width: (cm) 1.7 Depth: (cm) 0.1 Volume: (cm) 0.24 Character of Wound/Ulcer Post Debridement: Requires Further Debridement Post Procedure Diagnosis Same as Pre-procedure [name], [name] ([id number]) [id number] Page 2 of 8 Notes Scribed for Dr. Celine Ahr by Blanche East, RN Electronic Signature(s) Signed: 08/05/2022 9:25:17 AM By: [name] MD FACS Previous Signature: 08/03/2022 9:39:22 AM Version By: [name] MD FACS Previous Signature: 08/03/2022 4:55:35 PM Version By: Blanche East RN Entered By: [name] on 08/05/2022 09:25:17 -------------------------------------------------------------------------------- HPI Details Patient Name: Date of Service: [name], [name] [birth year]24 7:45 A M Medical Record Number: [id number] Patient Account Number: [id number] Date of Birth/Sex: Treating RN: [name][birth year] (52 y.o. F) Primary Care Provider: [name] Other Clinician: Referring Provider: Treating Provider/Extender: [name], [name] Weeks in Treatment: 14 History of Present Illness HPI Description: ADMISSION 05/25/2022 This is a 52 year old woman who is found to have an abnormal mammogram of her left breast earlier this year showing a 13 mm group of calcifications in the upper quadrant of the left breast. 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 She underwent a left lumpectomy on 11/12/2021. The patient states the wound never really healed and was open. She underwent radiation therapy from 12/10/2021 through 01/19/22 28 fractions of 1.8GY. Essentially the wound would not heal. She was treated several times with antibiotics finally on 03/25/2022 she was taken to the OR by Dr. Barry Dienes for excision of the wound in a sinus. Operative cultures grew strep and Prevotella and a culture from the clinic  Livedo MRSA. It was recommended for 4 weeks of Augmentin and doxycycline by infectious disease. She was seen by wound care and she has been treating the wound with Medihoney ever since. 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 She was on meth methotrexate 20 mg once a week however this I think was put on hold because of the wound followed by Dr. Posey Pronto of rheumatology. She has asthma. Most recent breast ultrasound was on 9/7 She has a large nonhealing wound on the lateral aspect of her left breast 04/30/2022: Although I do not have a photograph to compare to last week, the RN report is that it is substantially cleaner. She still has ample slough and nonviable fat and subcutaneous tissue present. She only was able to get her Santyl on Monday so she has just had a couple of days of treatment. 05/08/2022: The wound dimensions are about the same, but the surface is substantially cleaner. There is just a little bit of slough accumulation on the surface. 05/18/2022: The wound is a little bit smaller, but not much. It is quite a bit cleaner, however. 05/25/2022: The wound surface continues to improve. Very little slough accumulation this week. No substantial change in the wound dimensions. 06/01/2022: The wound surface continues to have a layer of slough on it. There is a crack in the surface near the 12 o'clock position that when further explored, revealed a cavity and tunnel that is about 2 cm deep. No purulent drainage or concern for infection. 12/11; left breast wound. The wound itself is somewhat smaller in size per our measurements. However she has a divot at 1:00 that measures about a 1.5 cm deep. We have been using iodoform packing and if it and Santyl to the  rest of the wound This wound was initially had a left lumpectomy on 11/12/2021 she underwent 28 fractions of radiation. She potentially could benefit from hyperbaric oxygen 12/18; left breast wound. Better looking wound surface and improvement in measurements of the small tunnel. We have been using Santyl and iodoform. 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 Underneath the layer of slough, the tissue is much healthier-looking, with better color and some granulation tissue beginning to form. 07/13/2022: The wound continues to fill with better looking granulation tissue. There is very little slough accumulation and the cavity is shallower. We are going to submit for insurance approval for hyperbaric oxygen therapy, as it has now been 6 months since the time of her initial wound and radiation therapy. 07/20/2022: The cavity continues to fill in and the surface of the wound is improving. EKG and chest x-ray are complete and we are just awaiting insurance approval for hyperbarics. 07/27/2022: The cavity has filled in considerably and the surface of the wound is very clean with just a thin layer of light slough. We are still awaiting insurance approval for hyperbaric oxygen therapy. 08/03/2022: She was approved for hyperbaric oxygen therapy and received 2 treatments last week. It is rather remarkable the improvement already in her wound. The cavity continues to fill and there is epithelialization occurring around the edges of her wound. Minimal slough accumulation. Electronic Signature(s) Signed: 08/03/2022 8:10:26 AM By: [name] MD FACS Entered By: [name] on 08/03/2022 08:10:26 [name] ([id number]) [id number] Page 3 of  8 -------------------------------------------------------------------------------- Physical Exam Details Patient Name: Date of Service: [name] [name] 08/03/2022 7:45 A M Medical Record Number: [id number] Patient Account Number: [id number] Date of Birth/Sex: Treating RN: [name] (52 y.o. F)  Primary Care Provider: [name] Other Clinician: Referring Provider: Treating Provider/Extender: [name], [name] Weeks in Treatment: 14 Constitutional Slightly hypertensive. . . . no acute distress. Respiratory Normal work of breathing on room air. Notes 08/03/2022: She was approved for hyperbaric oxygen therapy and received 2 treatments last week. It is rather remarkable the improvement already in her wound. The cavity continues to fill and there is epithelialization occurring around the edges of her wound. Minimal slough accumulation. Electronic Signature(s) Signed: 08/03/2022 8:10:57 AM By: [name] MD FACS Entered By: [name] on 08/03/2022 08:10:57 -------------------------------------------------------------------------------- Physician Orders Details Patient Name: Date of Service: [name] 08/03/2022 7:45 A M Medical Record Number: [id number] Patient Account Number: [id number] Date of Birth/Sex: Treating RN: [birth year] (52 y.o. [name], [name] Primary Care Provider: [name] Other Clinician: Referring Provider: Treating Provider/Extender: Shea Stakes in Treatment: 8 Verbal / Phone Orders: No Diagnosis Coding Follow-up Appointments ppointment in 1 week. - Dr. Celine Ahr Room 2 Return A Thursday 08/13/22 at 9.:15am ppointment in 2 weeks. - Dr. Celine Ahr Room 2 Return A Tuesday 08/18/22 at 9.:15am Anesthetic (In clinic) Topical Lidocaine 5% applied to wound bed Bathing/ Shower/ Hygiene Other Bathing/Shower/Hygiene Orders/Instructions: - Change dressing after bathing Edema Control - Lymphedema / SCD /  Other Other Edema Control Orders/Instructions: - Keep doing the Lymphadema checks Hyperbaric Oxygen Therapy Evaluate for HBO Therapy Indication: - soft tissue radionecrosis If appropriate for treatment, begin HBOT per protocol: 2.0 ATA for 90 Minutes without A Breaks ir Total Number of Treatments: - 40 One treatments per day (delivered Monday through Friday unless otherwise specified in Special Instructions below): A frin (Oxymetazoline HCL) 0.05% nasal spray - 1 spray in both nostrils daily as needed prior to HBO treatment for difficulty clearing ears Wound Treatment [name], [name] ([id number]) [id number] Page 4 of 8 Wound #1 - Breast Wound Laterality: Left Cleanser: Normal Saline (Generic) 1 x Per Day/30 Days Discharge Instructions: Cleanse the wound with Normal Saline prior to applying a clean dressing using gauze sponges, not tissue or cotton balls. Cleanser: Soap and Water 1 x Per Day/30 Days Discharge Instructions: May shower and wash wound with dial antibacterial soap and water prior to dressing change. Cleanser: [place] Ancillary Kit - 15 Day Supply (Generic) 1 x Per Day/30 Days Discharge Instructions: Use supplies as instructed; Kit contains: (15) Saline Bullets; (15) 3x3 Gauze; 15 pr Gloves Prim Dressing: Promogran Prisma Matrix, 4.34 (sq in) (silver collagen) (Dispense As Written) 1 x Per Day/30 Days ary Discharge Instructions: Moisten collagen with saline or hydrogel Secondary Dressing: Zetuvit Plus Silicone Border Dressing 5x5 (in/in) (Dispense As Written) 1 x Per Day/30 Days Discharge Instructions: Apply silicone border over primary dressing as directed. Electronic Signature(s) Signed: 08/03/2022 9:39:22 AM By: [name] MD FACS Signed: 08/03/2022 4:55:35 PM By: Blanche East RN Previous Signature: 08/03/2022 8:11:08 AM Version By: [name] MD FACS Entered By: Blanche East on 08/03/2022  08:11:24 -------------------------------------------------------------------------------- Problem List Details Patient Name: Date of Service: [name] Lightning [name] 53/29/2025 7:45 A M Medical Record Number: [id number] Patient Account Number: [id number] Date of Birth/Sex: Treating RN: [name]-[birth year] (52 y.o. F) Primary Care Provider: [name] Other Clinician: Referring Provider: Treating Provider/Extender: [name], [name] Weeks in Treatment: 52 Active Problems ICD-10 Encounter Code Description Active Date MDM Diagnosis S21.002D Unspecified open wound of left breast, subsequent encounter 04/24/2022 No Yes T81.31XD Disruption of external operation (surgical) wound, not elsewhere classified, 04/24/2022 No Yes subsequent encounter L59.8 Other specified disorders  of the skin and subcutaneous tissue related to 04/24/2022 No Yes radiation 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 04/24/2022 No Yes Inactive Problems Resolved Problems Electronic Signature(s) Signed: 08/03/2022 8:09:21 AM By: [name] MD FACS Entered By: [name] on 08/03/2022 08:09:21 [name] ([id number]) [id number] Page 5 of 8 -------------------------------------------------------------------------------- Progress Note Details Patient Name: Date of Service: [name] [name] 08/03/2022 7:45 A M Medical Record Number: [id number] Patient Account Number: [id number] Date of Birth/Sex: Treating RN: [name] 26, [name] (52 y.o. F) Primary Care Provider: [name] Other Clinician: Referring Provider: Treating Provider/Extender: [name], [name] Weeks in Treatment: 14 Subjective Chief Complaint Information obtained from Patient 04/24/2022; patient is here for a surgical wound on her left lateral breast History of Present Illness (HPI) ADMISSION 05/25/2022 This is a 52 year old woman who is found to have an abnormal mammogram of  her left breast earlier this year showing a 13 mm group of calcifications in the upper quadrant of the left breast. 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 She underwent a left lumpectomy on 11/12/2021. The patient states the wound never really healed and was open. She underwent radiation therapy from 12/10/2021 through 01/19/22 28 fractions of 1.8GY. Essentially the wound would not heal. She was treated several times with antibiotics finally on 03/25/2022 she was taken to the OR by Dr. Barry Dienes for excision of the wound in a sinus. Operative cultures grew strep and Prevotella and a culture from the clinic Livedo MRSA. It was recommended for 4 weeks of Augmentin and doxycycline by infectious disease. She was seen by wound care and she has been treating the wound with Medihoney ever since. 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 She was on meth methotrexate 20 mg once a week however this I think was put on hold because of the wound followed by Dr. Posey Pronto of rheumatology. She has asthma. Most recent breast ultrasound was on 9/7 She has a large nonhealing wound on the lateral aspect of her left breast 04/30/2022: Although I do not have a photograph to compare to last week, the RN report is that it is substantially cleaner. She still has ample slough and nonviable fat and subcutaneous tissue present. She only was able to get her Santyl on Monday so she has just had a couple of days of treatment. 05/08/2022: The wound dimensions are about the same, but the surface is substantially cleaner. There is just a little bit of slough accumulation on the surface. 05/18/2022: The wound is a little bit  smaller, but not much. It is quite a bit cleaner, however. 05/25/2022: The wound surface continues to improve. Very little slough accumulation this week. No substantial change in the wound dimensions. 06/01/2022: The wound surface continues to have a layer of slough on it. There is a crack in the surface near the 12 o'clock position that when further explored, revealed a cavity and tunnel that is about 2 cm deep. No purulent drainage or concern for infection. 12/11; left breast wound. The wound itself is somewhat smaller in size per our measurements. However she has a divot at 1:00 that measures about a 1.5 cm deep. We have been using iodoform packing and if it and Santyl to  the rest of the wound This wound was initially had a left lumpectomy on 11/12/2021 she underwent 28 fractions of radiation. She potentially could benefit from hyperbaric oxygen 12/18; left breast wound. Better looking wound surface and improvement in measurements of the small tunnel. We have been using Santyl and iodoform. 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 Underneath the layer of slough, the tissue is much healthier-looking, with better color and some granulation tissue beginning to form. 07/13/2022: The wound continues to fill with better looking granulation tissue. There is very little slough accumulation and the cavity is shallower. We are going to submit for insurance approval for hyperbaric oxygen therapy, as it has now been 6 months since the time of her initial wound and radiation therapy. 07/20/2022: The cavity continues to fill in and the surface of the wound is improving. EKG and chest x-ray are complete and we are just awaiting insurance approval for hyperbarics. 07/27/2022: The cavity has filled in  considerably and the surface of the wound is very clean with just a thin layer of light slough. We are still awaiting insurance approval for hyperbaric oxygen therapy. 08/03/2022: She was approved for hyperbaric oxygen therapy and received 2 treatments last week. It is rather remarkable the improvement already in her wound. The cavity continues to fill and there is epithelialization occurring around the edges of her wound. Minimal slough accumulation. Patient History Information obtained from Patient. Social History Never smoker, Marital Status - Married, Alcohol Use - Moderate, Drug Use - No History, Caffeine Use - Daily. Medical History Hematologic/Lymphatic Patient has history of Lymphedema - Left breast-gets Lymphadema tx Hospitalization/Surgery History - 03/25/22 Left Breast cyst excision; 11/12/21 Left Breast Lumpectomy with Radioactive seeds;Left breat Biopsy;Anterior. 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 12/08/21- 01/29/22- 37 rounds of Radiation Psychiatric General Anxiety Objective Constitutional Slightly hypertensive. no acute distress. Vitals Time Taken: 7:49 AM, Height: 63 in, Weight: 170 lbs, BMI: 30.1, Temperature: 98.5 F, Pulse: 85 bpm, Respiratory Rate: 16 breaths/min, Blood Pressure: 146/88 mmHg. Respiratory Normal work of breathing on room air. General Notes: 08/03/2022: She was approved for hyperbaric oxygen therapy and received 2 treatments last week. It is rather remarkable the improvement already in her wound. The cavity continues to fill and there is epithelialization occurring around the edges of her wound. Minimal slough accumulation. Integumentary (Hair, Skin) Wound #1 status is Open. Original cause  of wound was Surgical Injury. The date acquired was: 04/03/2022. The wound has been in treatment 14 weeks. The wound is located on the Left Breast. The wound measures 1.8cm length x 1.7cm width x 0.1cm depth; 2.403cm^2 area and 0.24cm^3 volume. There is Fat Layer (Subcutaneous Tissue) exposed. There is no tunneling or undermining noted. There is a medium amount of serosanguineous drainage noted. The wound margin is distinct with the outline attached to the wound base. There is large (67-100%) red granulation within the wound bed. There is a small (1-33%) amount of necrotic tissue within the wound bed including Adherent Slough. The periwound skin appearance had no abnormalities noted for moisture. The periwound skin appearance had no abnormalities noted for color. The periwound skin appearance  exhibited: Induration. Periwound temperature was noted as No Abnormality. Assessment Active Problems ICD-10 Unspecified open wound of left breast, subsequent encounter Disruption of external operation (surgical) wound, not elsewhere classified, subsequent encounter Other specified disorders of the skin and subcutaneous tissue related to radiation Intraductal carcinoma in situ of left breast Procedures Wound #1 Pre-procedure diagnosis of Wound #1 is a Malignant Wound located on the Left Breast . There was a Selective/Open Wound Non-Viable Tissue Debridement with a total area of 3.06 sq cm performed by [name], MD. With the following instrument(s): Curette to remove Non-Viable tissue/material. Material removed includes [facility] after achieving pain control using Lidocaine 5% topical ointment. No specimens were taken. A time out was conducted at 08:01, prior to the start of the procedure. A Minimum amount of bleeding was controlled with Pressure. The procedure was tolerated well. Post Debridement Measurements: 1.8cm length x 1.7cm width x 0.1cm depth; 0.24cm^3 volume. Character of Wound/Ulcer Post  Debridement requires further debridement. Post procedure Diagnosis Wound #1: Same as Pre-Procedure General Notes: Scribed for Dr. Celine Ahr by Blanche East, RN. Plan 08/03/2022: She was approved for hyperbaric oxygen therapy and received 2 treatments last week. It is rather remarkable the improvement already in her wound. The cavity continues to fill and there is epithelialization occurring around the edges of her wound. Minimal slough accumulation. [name], [name] ([id number]) [id number] Page 7 of 8 I used a curette to debride the slough from her wound. We will continue to use Prisma silver collagen, packing the cavity and applying it to the wound surface. She will continue her hyperbaric oxygen therapy. Follow-up in 1 week. Electronic Signature(s) Signed: 08/05/2022 9:25:31 AM By: [name] MD FACS Previous Signature: 08/04/2022 8:09:34 AM Version By: [name] MD FACS Previous Signature: 08/03/2022 8:11:48 AM Version By: [name] MD FACS Entered By: [name] on 08/05/2022 09:25:31 -------------------------------------------------------------------------------- HxROS Details Patient Name: Date of Service: [name], [name] 53/29/2025 7:45 A M Medical Record Number: [id number] Patient Account Number: [id number] Date of Birth/Sex: Treating RN: [birth year][name] (52 y.o. F) Primary Care Provider: [name] Other Clinician: Referring Provider: Treating Provider/Extender: [name], [name] Weeks in Treatment: 14 Information Obtained From Patient Ear/Nose/Mouth/Throat Medical History: Past Medical History Notes: Left ear limited hearing Hematologic/Lymphatic Medical History: Positive for: Lymphedema - Left breast-gets Lymphadema tx Gastrointestinal Medical History: Past Medical History Notes: GERD Immunological Medical History: Past Medical History Notes: Wegener"s Granulomatosis Musculoskeletal Medical  History: Past Medical History Notes: "RA like arthritis" as per patient Oncologic Medical History: Past Medical History Notes: Left Breast. 12/08/21- 01/29/22- 37 rounds of Radiation Psychiatric Medical History: Past Medical History Notes: General Anxiety Immunizations Pneumococcal Vaccine: Received Pneumococcal Vaccination: Yes Received Pneumococcal Vaccination On or After 60th BirthdayTIJANA, WALDER ([id number]) [id number] Page 8 of 8 Implantable Devices Yes Hospitalization / Surgery History Type of Hospitalization/Surgery 03/25/22 Left Breast cyst excision; 11/12/21 Left Breast Lumpectomy with Radioactive seeds;Left breat Biopsy;Anterior Family and Social History Never smoker; Marital Status - Married; Alcohol Use: Moderate; Drug Use: No History; Caffeine Use: Daily; Financial Concerns: No; Food, Clothing or Shelter Needs: No; Support System Lacking: No; Transportation Concerns: No Electronic Signature(s) Signed: 08/03/2022 9:39:22 AM By: [name] MD FACS Entered By: [name] on 08/03/2022 08:10:30 -------------------------------------------------------------------------------- SuperBill Details Patient Name: Date of Service: [name] 08/03/2022 Medical Record Number: [id number] Patient Account Number: [id number] Date of Birth/Sex: Treating RN: [name] (52 y.o. 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 Facility Procedures : CPT4 Code:  [id number] Description: 95284 - DEBRIDE WOUND 1ST 20 SQ CM OR < ICD-10 Diagnosis Description S21.002D Unspecified open wound of left breast, subsequent encounter T81.31XD Disruption of external operation (surgical) wound, not elsewhere classified, subs Modifier: equent encounter Quantity: 1 Physician Procedures : CPT4 Code Description Modifier [id number] 99214 - WC PHYS LEVEL 4 - EST PT 25 ICD-10 Diagnosis Description S21.002D Unspecified open wound of left breast, subsequent encounter T81.31XD Disruption of external operation (surgical) wound, not elsewhere  classified, subsequent encounter 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

## 2022-08-03 NOTE — Progress Notes (Addendum)
AZIZI, BALLY ([id number]) [id number] Page 1 of 2 Visit Report for 08/03/2022 HBO Details Patient Name: Date of Service: [name], [name] 08/03/2022 10:00 A M Medical Record Number: [id number] Patient Account Number: [id number] Date of Birth/Sex: Treating RN: 1971-04-22 (52 y.o. 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 HBO Treatment Details Treatment Number: 3 Patient Type: Outpatient Chamber Type: Monoplace Chamber Serial #: [id number] Treatment Protocol: 2.0 ATA with 90 minutes oxygen, and no air breaks Treatment Details Compression Rate Down: 1.5 psi / minute De-Compression Rate Up: 2.0 psi / minute Air breaks and breathing Decompress Decompress Compress Tx Pressure Begins Reached periods Begins Ends (leave unused spaces blank) Chamber Pressure (ATA 1 2 ------2 1 ) Clock Time (24 hr) 10:08 10:18 - - - - - - 10:48 11:56 Treatment Length: 108 (minutes) Treatment Segments: 4 Vital Signs Capillary Blood Glucose Reference Range: 80 - 120 mg / dl HBO Diabetic Blood Glucose Intervention Range: <131 mg/dl or >249 mg/dl Time Vitals Blood Respiratory Capillary Blood Glucose Pulse Action Type: Pulse: Temperature: Taken: Pressure: Rate: Glucose (mg/dl): Meter #: Oximetry (%) Taken: Pre 09:55 121/76 72 18 97.6 Post 11:57 132/90 72 18 97.3 Treatment Response Treatment Toleration: Well Treatment Completion Status: Treatment Completed without Adverse Event Physician HBO Attestation: I certify that I supervised this HBO treatment in accordance with Medicare guidelines. A trained emergency  response team is readily available per Yes hospital policies and procedures. Continue HBOT as ordered. Yes Electronic Signature(s) Signed: 08/03/2022 4:08:23 PM By: [name] MD FACS Previous Signature: 08/03/2022 2:44:05 PM Version By: Valeria Batman EMT Entered By: [name] on 08/03/2022 16:08:23 [name] ([id number]) [id number] Page 2 of 2 -------------------------------------------------------------------------------- HBO Safety Checklist Details Patient Name: Date of Service: [name] [name] 08/03/2022 10:00 A M Medical Record Number: [id number] Patient Account Number: [id number] Date of Birth/Sex: Treating RN: [birth year] (52 y.o. [name], Meta.Reding Primary Care [name]: [name] Other Clinician: Valeria Batman Referring [name]: Treating [name]/Extender: [name], [name] Weeks in Treatment: 14 HBO Safety Checklist Items Safety Checklist Consent Form Signed Patient voided / foley secured and emptied When did you last eato 0915 Last dose of injectable or oral agent NA Ostomy pouch emptied and vented if applicable NA All implantable devices assessed, documented and approved NA Intravenous access site secured and place NA Valuables secured Linens and cotton and cotton/polyester blend (less than 51% polyester) Personal oil-based products / skin lotions / body lotions removed Wigs or hairpieces removed removed Smoking or tobacco materials removed Books / newspapers / magazines / loose paper removed Cologne, aftershave, perfume and deodorant removed Jewelry removed (may wrap wedding band) Make-up removed Hair care products removed Battery operated devices (external) removed Heating patches and chemical warmers removed Titanium eyewear removed NA Nail polish cured greater than 10 hours 2 weeks ago Casting material cured greater than 10 hours NA Hearing aids removed NA Loose dentures or partials  removed NA Prosthetics have been removed NA Patient demonstrates correct use of air break device (if applicable) Patient concerns have been addressed Patient grounding bracelet on and cord attached to chamber Specifics for Inpatients (complete in addition to above) Medication sheet sent with patient NA Intravenous medications needed or due during therapy sent  with patient NA Drainage tubes (e.g. nasogastric tube or chest tube secured and vented) NA Endotracheal or Tracheotomy tube secured NA Cuff deflated of air and inflated with saline NA Airway suctioned NA Notes The safety checklist was done before the treatment was started. Electronic Signature(s) Signed: 08/03/2022 2:43:09 PM By: Valeria Batman EMT Entered By: Valeria Batman on 08/03/2022 14:43:09

## 2022-08-04 ENCOUNTER — Encounter (HOSPITAL_BASED_OUTPATIENT_CLINIC_OR_DEPARTMENT_OTHER): Payer: 59 | Admitting: General Surgery

## 2022-08-04 ENCOUNTER — Inpatient Hospital Stay: Payer: 59 | Attending: Oncology | Admitting: Oncology

## 2022-08-04 DIAGNOSIS — C50919 Malignant neoplasm of unspecified site of unspecified female breast: Secondary | ICD-10-CM | POA: Diagnosis not present

## 2022-08-04 DIAGNOSIS — T8131XA Disruption of external operation (surgical) wound, not elsewhere classified, initial encounter: Secondary | ICD-10-CM | POA: Diagnosis not present

## 2022-08-04 NOTE — Progress Notes (Signed)
Patient scheduled for virtual visit today for follow up regarding DCIS. Patient reports she is tolerating letrozole well. Patient reports she is currently going for daily hyperbaric treatments for wound management.

## 2022-08-04 NOTE — Progress Notes (Signed)
[facility]  [phone] [phone]  ID: SHIRLENE ANDAYA OB: 01-07-1971  MR#: [id number]  VEL#:[id number]  Patient Care Team: Mckinley Jewel, MD as PCP - General (Internal Medicine)  I connected with [name] on 08/04/22 at  2:00 PM EST by video enabled telemedicine visit and verified that I am speaking with the correct person using two identifiers.   I discussed the limitations, risks, security and privacy concerns of performing an evaluation and management service by telemedicine and the availability of in-person appointments. I also discussed with the patient that there may be a patient responsible charge related to this service. The patient expressed understanding and agreed to proceed.   Other persons participating in the visit and their role in the encounter: Patient, MD.  Patient's location: Home. Provider's location: Clinic.  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  INTERVAL HISTORY: Patient agreed to video assisted telemedicine visit for routine 53-monthevaluation.  She is now undergoing hyperbaric treatment for her breast wound.  She is tolerating her treatments well.  She otherwise feels well and is asymptomatic.  She is tolerating letrozole without significant side effects.  She has no neurologic complaints.  She denies any recent fevers or illnesses.  She has a good appetite and denies weight loss.  She has no chest pain, shortness of breath, cough, or hemoptysis.  She denies any nausea, vomiting, constipation, or diarrhea.  She has no urinary complaints.  Patient offers no further specific complaints today.  REVIEW OF SYSTEMS:   Review of Systems  Constitutional: Negative.  Negative for fever, malaise/fatigue and weight loss.  Respiratory: Negative.  Negative for cough, hemoptysis and shortness of breath.   Cardiovascular:  Negative for chest pain and leg swelling.  Gastrointestinal:  Negative.  Negative for abdominal pain.  Genitourinary: Negative.  Negative for dysuria.  Musculoskeletal: Negative.  Negative for back pain.  Skin: Negative.  Negative for rash.  Neurological: Negative.  Negative for dizziness, focal weakness, weakness and headaches.  Psychiatric/Behavioral: Negative.  The patient is not nervous/anxious.     As per HPI. Otherwise, a complete review of systems is negative.  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    [facility]---  dx 2015,  takes methotrexate wkly, pt stated she is stable    PAST SURGICAL HISTORY: Past Surgical History:  Procedure Laterality Date   ANTERIOR AND POSTERIOR REPAIR N/A 01/14/2021   Procedure: ANTERIOR (CYSTOCELE) AND POSTERIOR REPAIR (RECTOCELE);  Surgeon: MCheri Fowler MD;  [facility]  Service: Gynecology;  Laterality: N/A;   AUGMENTATION MAMMAPLASTY Bilateral 2010   saline   BREAST BIOPSY Left 10/23/2021   Stereo Bx, X-clip, Ductal carcinoma in situ   BREAST CYST EXCISION Left 03/25/2022   Procedure: EXCISION OF CHRONIC LEFT BREAST WOUND;  Surgeon: BStark Klein MD;  [place] [facility]  Service: General;  Laterality: Left;   BREAST ENHANCEMENT SURGERY  2009   BREAST LUMPECTOMY     BREAST LUMPECTOMY WITH RADIOACTIVE SEED AND SENTINEL LYMPH NODE BIOPSY Left 11/12/2021   Procedure: LEFT BREAST LUMPECTOMY WITH RADIOACTIVE SEED AND  SENTINEL LYMPH NODE BIOPSY;  Surgeon: Stark Klein, MD;  Location: [facility];  Service: General;  Laterality: Left;   CARPAL TUNNEL RELEASE Right 2011   LAPAROSCOPIC SUPRACERVICAL HYSTERECTOMY  02/21/2010   @ [facility] by Dr Willis Modena   NASAL SEPTUM SURGERY  01/2018   insertion button prosthesis for septal perforation   RHINOPLASTY  2005   nasal fracture    FAMILY HISTORY: Family History  Problem Relation Age of Onset   Asthma Mother    Depression Mother    Diabetes Mother    Hyperlipidemia Mother    Hypertension Mother    Mental illness Mother    Arthritis Mother    Pancreatic cancer Mother    Heart disease Father 13       AMI/CABG   Mental illness Sister    Alzheimer's disease Maternal Grandmother    Hyperlipidemia Maternal Grandmother    Hypertension Maternal Grandmother    Heart attack Maternal Grandfather    Lung cancer Maternal Grandfather    Myasthenia gravis Daughter    Charcot-Marie-Tooth disease Daughter    ADD / ADHD Daughter    Pancreatic cancer Maternal Aunt     ADVANCED DIRECTIVES (Y/N):  N  HEALTH MAINTENANCE: Social History   Tobacco Use   Smoking status: Never   Smokeless tobacco: Never  Vaping Use   Vaping Use: Never used  Substance Use Topics   Alcohol use: Yes    Comment: occasional   Drug use: Never     Colonoscopy:  PAP:  Bone density:  Lipid panel:  Allergies  Allergen Reactions   Kiwi Extract Shortness Of Breath and Swelling   Ativan [Lorazepam] Other (See Comments)    Hyperactivity per patient   Erythromycin Base Nausea And Vomiting   Nitrofurantoin Nausea And Vomiting   Other     Latex self dissolving sutures "work their way out"   Xanax [Alprazolam] Other (See Comments)    Makes patient hyperactive     Current Outpatient Medications  Medication Sig Dispense Refill   albuterol (VENTOLIN HFA) 108 (90 Base) MCG/ACT inhaler Inhale 2 puffs into the lungs every 6 (six) hours as needed for wheezing or shortness of  breath (cough, shortness of breath or wheezing.). 1 each 1   folic acid (FOLVITE) 1 MG tablet Take 1 tablet (1 mg total) by mouth once daily (Patient taking differently: Take 1 mg by mouth daily.) 90 tablet 3   gabapentin (NEURONTIN) 300 MG capsule Take 1 capsule (300 mg total) by mouth at bedtime (Patient taking differently: Take 300 mg by mouth daily as needed (For pain).) 30 capsule 5   hydroxychloroquine (PLAQUENIL) 200 MG tablet Take 1 tablet (200 mg total) by mouth 2 (two) times daily (Patient taking differently: Take 200 mg by mouth daily.) 60 tablet 5   hydrOXYzine (VISTARIL) 25 MG capsule Take 1 capsule (25 mg total) by mouth daily as needed. (Patient taking differently: Take 25 mg by mouth daily.) 90 capsule 0   letrozole (FEMARA) 2.5 MG tablet Take 1 tablet (2.5 mg total) by mouth daily. 30 tablet 3   methotrexate (RHEUMATREX) 2.5 MG tablet Take 8 tablets (20 mg total) by mouth every 7 (seven) days. 96 tablet 1   montelukast (SINGULAIR) 10 MG tablet  Take 1 tablet (10 mg total) by mouth daily. 30 tablet 6   pantoprazole (PROTONIX) 40 MG tablet Take 1 tablet (40 mg total) by mouth 2 (two) times daily. 180 tablet 1   sertraline (ZOLOFT) 100 MG tablet Take 1 tablet (100 mg total) by mouth daily. 90 tablet 0   No current facility-administered medications for this visit.    OBJECTIVE: There were no vitals filed for this visit.     There is no height or weight on file to calculate BMI.    ECOG FS:0 - Asymptomatic  General: Well-developed, well-nourished, no acute distress. HEENT: Normocephalic. Neuro: Alert, answering all questions appropriately. Cranial nerves grossly intact. Psych: Normal affect.  LAB RESULTS:  Lab Results  Component Value Date   NA 142 04/04/2022   K 4.4 04/04/2022   CL 112 (H) 04/04/2022   CO2 25 04/04/2022   GLUCOSE 119 (H) 04/04/2022   BUN 12 04/04/2022   CREATININE 0.66 04/04/2022   CALCIUM 8.5 (L) 04/04/2022   PROT 5.8 (L) 04/04/2022   ALBUMIN 2.9 (L)  04/04/2022   AST 14 (L) 04/04/2022   ALT 8 04/04/2022   ALKPHOS 50 04/04/2022   BILITOT 0.4 04/04/2022   GFRNONAA >60 04/04/2022   GFRAA 121 06/10/2016    Lab Results  Component Value Date   WBC 8.1 04/08/2022   NEUTROABS 8.6 (H) 04/03/2022   HGB 9.8 (L) 04/08/2022   HCT 30.0 (L) 04/08/2022   MCV 88.5 04/08/2022   PLT 248 04/08/2022     STUDIES: DG Chest 2 View  Result Date: 07/15/2022 CLINICAL DATA:  Postoperative wound dehiscence on the breast after radiation treatment EXAM: CHEST - 2 VIEW COMPARISON:  CTA chest dated 01/02/2014 FINDINGS: Normal lung volumes. No focal consolidations. No pleural effusion or pneumothorax. The heart size and mediastinal contours are within normal limits. Retrocardiac lucency corresponding to known hiatal hernia. The visualized skeletal structures are unremarkable. Left axillary surgical clips. IMPRESSION: No active cardiopulmonary disease. Electronically Signed   By: Darrin Nipper M.D.   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  Patient underwent lumpectomy on Nov 12, 2021.  She has had multiple postsurgical complications with wound infection requiring multiple procedures and is now undergoing hyperbaric treatment.  Patient attributes much of her ongoing issues to XRT.  She was placed on letrozole given the focus of invasive carcinoma as well as an interaction of tamoxifen and Zoloft.  Continue treatment for a total of 5 years completing in July 2028.  No further intervention is needed at this time.  Patient is due for her next scheduled mammogram in March 2024, but will wait until her wound completely heals if needed.  Patient will have video-assisted telemedicine visit in 6 months.   Genetics: Patient was tested in January 2022 and this was reported as negative.   Bone health: Bone mineral density on June 23, 2022 was reported as normal.  Repeat in December 2025.   Breast wound: Continue follow-up with wound care center at [facility] as scheduled.  Patient is now undergoing hyperbaric treatments.  I provided 20 minutes of face-to-face video visit time during this encounter which included chart review, counseling, and coordination of care as documented above.      Patient expressed understanding and was in agreement with this plan. She also understands that She can call  clinic at any time with any questions, concerns, or complaints.    Cancer Staging  Ductal carcinoma in situ (DCIS) of left breast Staging form: Breast, AJCC 8th Edition - Clinical: Stage IA (cT52m, cN0, cM0, G1, ER+, PR+, HER2-) - Signed by FLloyd Huger MD on 11/28/2021 Histologic grading system: 3 grade system   TLloyd Huger MD   08/04/2022 6:10 PM

## 2022-08-05 ENCOUNTER — Encounter (HOSPITAL_BASED_OUTPATIENT_CLINIC_OR_DEPARTMENT_OTHER): Payer: 59 | Admitting: General Surgery

## 2022-08-05 DIAGNOSIS — T8131XA Disruption of external operation (surgical) wound, not elsewhere classified, initial encounter: Secondary | ICD-10-CM | POA: Diagnosis not present

## 2022-08-05 NOTE — Progress Notes (Signed)
PEARLINA, FRIEDLY ([id number]) [id number] Page 1 of 2 Visit Report for 08/05/2022 Problem List Details Patient Name: Date of Service: [name], [name] 08/05/2022 10:00 A M Medical Record Number: [id number] Patient Account Number: [id number] Date of Birth/Sex: Treating RN: 26-Oct-1970 (52 y.o. 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 08/05/2022 Medical Record Number: [id number] Patient Account Number: [id number] Date of Birth/Sex: Treating RN: Apr 14, 1971 (52 y.o. 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 Facility Procedures : CPT4 Code Description: [id number]-(Facility Use Only) HBOT full body chamber, 70mn , ICD-10 Diagnosis Description L59.8 Other specified disorders of the skin and subcutaneous tissue related to rad S21.002D Unspecified open wound of left breast,  subsequent encounter T81.31XD Disruption of external operation (surgical) wound, not elsewhere classified, 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 Modifier: iation subsequent encou Quantity: 4 nter Physician Procedures : CPT4 Code Description Modifier [id number] 81829- WC PHYS HYPERBARIC OXYGEN THERAPY ICD-10 Diagnosis Description 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 S21.002D Unspecified open wound of left breast,  subsequent encounter T81.31XD Disruption of external operation (surgical) wound, not elsewhere classified, subsequent enco 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

## 2022-08-05 NOTE — Progress Notes (Addendum)
ASHEA, WINIARSKI ([id number]) [id number] Page 1 of 2 Visit Report for 08/04/2022 HBO Details Patient Name: Date of Service: [name] 08/04/2022 10:00 A M Medical Record Number: [id number] Patient Account Number: [id number] Date of Birth/Sex: Treating RN: 1970-07-19 (52 y.o. 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 HBO Treatment Details Treatment Number: 4 Patient Type: Outpatient Chamber Type: Monoplace Chamber Serial #: [id number] Treatment Protocol: 2.0 ATA with 90 minutes oxygen, and no air breaks Treatment Details Compression Rate Down: 1.5 psi / minute De-Compression Rate Up: 2.0 psi / minute Air breaks and breathing Decompress Decompress Compress Tx Pressure Begins Reached periods Begins Ends (leave unused spaces blank) Chamber Pressure (ATA 1 2 ------2 1 ) Clock Time (24 hr) 09:58 10:06 - - - - - - 11:36 11:45 Treatment Length: 107 (minutes) Treatment Segments: 4 Vital Signs Capillary Blood Glucose Reference Range: 80 - 120 mg / dl HBO Diabetic Blood Glucose Intervention Range: <131 mg/dl or >249 mg/dl Type: Time Vitals Blood Respiratory Capillary Blood Glucose Pulse Action Pulse: Temperature: Taken: Pressure: Rate: Glucose (mg/dl): Meter #: Oximetry (%) Taken: Pre 09:50 109/70 75 18 98.7 none per protocol Post 11:47 114/79 83 18 97.5 none per protocol Treatment Response Treatment Toleration: Well Treatment Completion Status: Treatment Completed without Adverse Event Treatment Notes Patient arrived, prepared for treatment, vitals taken. After safety check,  patient was placed in the chamber. Rate set was at 1 psi/min until reaching approximately 4 psig at which time Mrs. Claude confirmed ear equalization as normal and set rate was increased to 1.5 psi/min until treatment depth. Patient tolerated treatment and decompression of the chamber at 2 psi/min. Patient denied any issues with ear equalization and/or pain. Patient was stable upon discharge. Physician HBO Attestation: I certify that I supervised this HBO treatment in accordance with Medicare guidelines. A trained emergency response team is readily available per Yes hospital policies and procedures. Continue HBOT as ordered. Yes Electronic Signature(s) Signed: 08/05/2022 9:27:14 AM By: [name] MD FACS Previous Signature: 08/04/2022 6:37:38 PM Version By: Donavan Burnet CHT EMT BS , , Entered By: [name] on 08/05/2022 09:27:14 [name] ([id number]) [id number] Page 2 of 2 -------------------------------------------------------------------------------- HBO Safety Checklist Details Patient Name: Date of Service: [name] 08/04/2022 10:00 A M Medical Record Number: [id number] Patient Account Number: [id number] Date of Birth/Sex: Treating RN: 1971-04-15 (52 y.o. [name] Primary Care Madison Albea: [name] Other Clinician: Donavan Burnet Referring Brylee Berk: Treating Kourtlynn Trevor/Extender: Shea Stakes in Treatment: 14 HBO Safety Checklist Items Safety Checklist Consent Form Signed Patient voided / foley secured and emptied When did you last eato 0930 Last dose of injectable or oral agent n/a Ostomy pouch emptied and vented if applicable NA All implantable devices assessed, documented and approved NA Intravenous access site secured and place NA Valuables secured Linens and cotton and cotton/polyester blend (less than 51% polyester) Personal oil-based products / skin lotions / body lotions  removed Wigs or hairpieces removed NA Smoking or tobacco materials removed NA Books / newspapers / magazines / loose paper removed Cologne, aftershave, perfume and deodorant removed Jewelry removed (may wrap wedding band) Make-up removed Hair care products removed Battery operated devices (external) removed Heating patches and  chemical warmers removed Titanium eyewear removed Nail polish cured greater than 10 hours Casting material cured greater than 10 hours Hearing aids removed NA Loose dentures or partials removed NA Prosthetics have been removed NA Patient demonstrates correct use of air break device (if applicable) Patient concerns have been addressed Patient grounding bracelet on and cord attached to chamber Specifics for Inpatients (complete in addition to above) Medication sheet sent with patient NA Intravenous medications needed or due during therapy sent with patient NA Drainage tubes (e.g. nasogastric tube or chest tube secured and vented) NA Endotracheal or Tracheotomy tube secured NA Cuff deflated of air and inflated with saline NA Airway suctioned NA Notes Paper version used prior to treatment. Electronic Signature(s) Signed: 08/04/2022 6:32:22 PM By: Donavan Burnet CHT EMT BS , , Entered By: Donavan Burnet on 08/04/2022 18:32:21

## 2022-08-05 NOTE — Progress Notes (Signed)
[name], [name] ([id number]) [id number] Page 1 of 2 Visit Report for 08/05/2022 Arrival Information Details Patient Name: Date of Service: [name], [name] 08/05/2022 10:00 A M Medical Record Number: [id number] Patient Account Number: [id number] Date of Birth/Sex: Treating RN: [birth year] (52 y.o. Donney Rankins, [name] Primary Care Jenissa Tyrell: [name] Other Clinician: Valeria Batman Referring Chalmers Iddings: Treating Javoni Lucken/Extender: Shea Stakes in Treatment: 14 Visit Information History Since Last Visit All ordered tests and consults were completed: Yes Patient Arrived: Ambulatory Added or deleted any medications: No Arrival Time: 09:44 Any new allergies or adverse reactions: No Accompanied By: None Had a fall or experienced change in No Transfer Assistance: None activities of daily living that may affect Patient Identification Verified: Yes risk of falls: Secondary Verification Process Completed: Yes Signs or symptoms of abuse/neglect since last visito No Patient Requires Transmission-Based Precautions: No Hospitalized since last visit: No Patient Has Alerts: No Implantable device outside of the clinic excluding No cellular tissue based products placed in the center since last visit: Pain Present Now: No Electronic Signature(s) Signed: 08/05/2022 3:43:43 PM By: Valeria Batman EMT Entered By: Valeria Batman on 08/05/2022 15:43:43 -------------------------------------------------------------------------------- Encounter Discharge Information Details Patient Name: Date of Service: [name] [name], [name] 08/05/2022 10:00 A M Medical Record Number: [id number] Patient Account Number: [id number] Date of Birth/Sex: Treating RN: 06/07/1971 (52 y.o. [name] Primary Care Abeeha Twist: [name] Other Clinician: Valeria Batman Referring Ines Warf: Treating Bharat Antillon/Extender: Shea Stakes in  Treatment: 8 Encounter Discharge Information Items Discharge Condition: Stable Ambulatory Status: Ambulatory Discharge Destination: Home Transportation: Private Auto Accompanied By: None Schedule Follow-up Appointment: Yes Clinical Summary of Care: Electronic Signature(s) Signed: 08/05/2022 3:54:47 PM By: Valeria Batman EMT Entered By: Valeria Batman on 08/05/2022 15:54:47 [name] ([id number]) [id number] Page 2 of 2 -------------------------------------------------------------------------------- Vitals Details Patient Name: Date of Service: [name], [name] 08/05/2022 10:00 A M Medical Record Number: [id number] Patient Account Number: [id number] Date of Birth/Sex: Treating RN: 1970-09-23 (52 y.o. [name] Primary Care Wilburt Messina: [name] Other Clinician: Valeria Batman Referring Dallie Patton: Treating Nathaneil Feagans/Extender: [name], [name] Weeks in Treatment: 14 Vital Signs Time Taken: 10:21 Temperature (F): 97.5 Height (in): 63 Pulse (bpm): 76 Weight (lbs): 170 Respiratory Rate (breaths/min): 18 Body Mass Index (BMI): 30.1 Blood Pressure (mmHg): 132/83 Reference Range: 80 - 120 mg / dl Electronic Signature(s) Signed: 08/05/2022 3:44:10 PM By: Valeria Batman EMT Entered By: Valeria Batman on 08/05/2022 15:44:10

## 2022-08-05 NOTE — Progress Notes (Signed)
CHANTE, MAYSON ([id number]) [id number] Page 1 of 2 Visit Report for 08/04/2022 Arrival Information Details Patient Name: Date of Service: [name] 08/04/2022 10:00 A M Medical Record Number: [id number] Patient Account Number: [id number] Date of Birth/Sex: Treating RN: 02-14-1971 (52 y.o. [name], Linda Primary Care Raini Tiley: [name] Other Clinician: Donavan Burnet Referring Adelis Docter: Treating Mayme Profeta/Extender: Shea Stakes in Treatment: 43 Visit Information History Since Last Visit All ordered tests and consults were completed: Yes Patient Arrived: Ambulatory Added or deleted any medications: No Arrival Time: 09:42 Any new allergies or adverse reactions: No Accompanied By: self Had a fall or experienced change in No Transfer Assistance: None activities of daily living that may affect Patient Identification Verified: Yes risk of falls: Secondary Verification Process Completed: Yes Signs or symptoms of abuse/neglect since last visito No Patient Requires Transmission-Based Precautions: No Hospitalized since last visit: No Patient Has Alerts: No Implantable device outside of the clinic excluding No cellular tissue based products placed in the center since last visit: Pain Present Now: No Electronic Signature(s) Signed: 08/04/2022 6:30:09 PM By: Donavan Burnet CHT EMT BS , , Entered By: Donavan Burnet on 08/04/2022 18:30:09 -------------------------------------------------------------------------------- Encounter Discharge Information Details Patient Name: Date of Service: [name], [name] NIE W. 08/04/2022 10:00 A M Medical Record Number: [id number] Patient Account Number: [id number] Date of Birth/Sex: Treating RN: [birth year] (52 y.o. [name] Primary Care Garl Speigner: [name] Other Clinician: Donavan Burnet Referring Jakeira Seeman: Treating Precious Gilchrest/Extender: Shea Stakes in Treatment: 54 Encounter Discharge Information Items Discharge Condition: Stable Ambulatory Status: Ambulatory Discharge Destination: Home Transportation: Private Auto Accompanied By: self Schedule Follow-up Appointment: No Clinical Summary of Care: Electronic Signature(s) Signed: 08/04/2022 6:39:07 PM By: Donavan Burnet CHT EMT BS , , Entered By: Donavan Burnet on 08/04/2022 18:39:07 [name] ([id number]) [id number] Page 2 of 2 -------------------------------------------------------------------------------- Vitals Details Patient Name: Date of Service: [name], [name] 08/04/2022 10:00 A M Medical Record Number: [id number] Patient Account Number: [id number] Date of Birth/Sex: Treating RN: 1970-09-06 (52 y.o. [name] Primary Care Nicolina Hirt: [name] Other Clinician: Donavan Burnet Referring Sarenity Ramaker: Treating Pastor Sgro/Extender: [name], [name] Weeks in Treatment: 14 Vital Signs Time Taken: 09:50 Temperature (F): 98.7 Height (in): 63 Pulse (bpm): 75 Weight (lbs): 170 Respiratory Rate (breaths/min): 18 Body Mass Index (BMI): 30.1 Blood Pressure (mmHg): 109/70 Reference Range: 80 - 120 mg / dl Electronic Signature(s) Signed: 08/04/2022 6:30:48 PM By: Donavan Burnet CHT EMT BS , , Entered By: Donavan Burnet on 08/04/2022 18:30:47

## 2022-08-05 NOTE — Progress Notes (Signed)
MAGNOLIA, MATTILA ([id number]) [id number] Page 1 of 2 Visit Report for 08/05/2022 HBO Details Patient Name: Date of Service: [name] RD, [name] 08/05/2022 10:00 A M Medical Record Number: [id number] Patient Account Number: [id number] Date of Birth/Sex: Treating RN: [birth year] (52 y.o. 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 HBO Treatment Details Treatment Number: 5 Patient Type: Outpatient Chamber Type: Monoplace Chamber Serial #: [id number] Treatment Protocol: 2.0 ATA with 90 minutes oxygen, and no air breaks Treatment Details Compression Rate Down: 2.0 psi / minute De-Compression Rate Up: 2.0 psi / minute Air breaks and breathing Decompress Decompress Compress Tx Pressure Begins Reached periods Begins Ends (leave unused spaces blank) Chamber Pressure (ATA 1 2 ------2 1 ) Clock Time (24 hr) 10:30 10:38 - - - - - - 12:08 12:16 Treatment Length: 106 (minutes) Treatment Segments: 4 Vital Signs Capillary Blood Glucose Reference Range: 80 - 120 mg / dl HBO Diabetic Blood Glucose Intervention Range: <131 mg/dl or >249 mg/dl Time Vitals Blood Respiratory Capillary Blood Glucose Pulse Action Type: Pulse: Temperature: Taken: Pressure: Rate: Glucose (mg/dl): Meter #: Oximetry (%) Taken: Pre 10:21 132/83 76 18 97.5 Post 12:18 113/71 78 18 97.3 Treatment Response Treatment Toleration: Well Treatment Completion Status: Treatment Completed without Adverse Event Treatment Notes The patient stated that he had egg and coffee this morning. Physician HBO Attestation: I certify that I  supervised this HBO treatment in accordance with Medicare guidelines. A trained emergency response team is readily available per Yes hospital policies and procedures. Continue HBOT as ordered. Yes Electronic Signature(s) Signed: 08/05/2022 3:52:28 PM By: [name] MD FACS Previous Signature: 08/05/2022 3:52:01 PM Version By: Valeria Batman EMT Previous Signature: 08/05/2022 3:51:13 PM Version By: Valeria Batman EMT Entered By: [name] on 08/05/2022 15:52:28 [name] ([id number]) [id number] Page 2 of 2 -------------------------------------------------------------------------------- HBO Safety Checklist Details Patient Name: Date of Service: [name] 08/05/2022 10:00 A M Medical Record Number: [id number] Patient Account Number: [id number] Date of Birth/Sex: Treating RN: [birth year] (52 y.o. [name] Zyra Parrillo: [name] Other Clinician: Valeria Batman Referring Marykathleen Russi: Treating Ivo Moga/Extender: [name], [name] Weeks in Treatment: 14 HBO Safety Checklist Items Safety Checklist Consent Form Signed Patient voided / foley secured and emptied When did you last eato 0915 Last dose of injectable or oral agent NA Ostomy pouch emptied and vented if applicable NA All implantable devices assessed, documented and approved NA Intravenous access site secured and place NA Valuables secured Linens and cotton and cotton/polyester blend (less than 51% polyester) Personal oil-based products / skin lotions / body lotions removed Wigs or hairpieces removed Smoking or tobacco materials removed Books / newspapers / magazines / loose paper removed Cologne, aftershave, perfume and deodorant removed Jewelry removed (may wrap wedding band) Make-up removed Hair care products removed Battery operated devices (external) removed Heating patches and chemical warmers removed Titanium eyewear  removed NA Nail polish cured greater than 10 hours 2 weeks ago Casting material cured greater than 10 hours NA Hearing aids removed NA Loose dentures or partials removed NA Prosthetics have been removed NA Patient demonstrates correct use of air break device (if applicable) Patient concerns have been addressed Patient grounding bracelet on and cord attached to chamber  Specifics for Inpatients (complete in addition to above) Medication sheet sent with patient NA Intravenous medications needed or due during therapy sent with patient NA Drainage tubes (e.g. nasogastric tube or chest tube secured and vented) NA Endotracheal or Tracheotomy tube secured NA Cuff deflated of air and inflated with saline NA Airway suctioned NA Notes The safety checklist was done before the treatment was started. Electronic Signature(s) Signed: 08/05/2022 3:45:25 PM By: Valeria Batman EMT Entered By: Valeria Batman on 08/05/2022 15:45:24

## 2022-08-05 NOTE — Progress Notes (Signed)
MONIE, SHERE ([id number]) [id number] Page 1 of 1 Visit Report for 08/04/2022 SuperBill Details Patient Name: Date of Service: [name] 08/04/2022 Medical Record Number: [id number] Patient Account Number: [id number] Date of Birth/Sex: Treating RN: 1970/07/22 (52 y.o. 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 Facility Procedures CPT4 Code Description Modifier Quantity [id number] G0277-(Facility Use Only) HBOT full body chamber, 42mn , 4 ICD-10 Diagnosis Description 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 S21.002D Unspecified open wound of left breast, subsequent encounter T81.31XD Disruption of external operation (surgical) wound, not elsewhere classified, subsequent encounter 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 Physician Procedures Quantity CPT4 Code Description Modifier [id number] 62376- WC PHYS HYPERBARIC OXYGEN THERAPY 1 ICD-10 Diagnosis Description 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

## 2022-08-06 ENCOUNTER — Encounter (HOSPITAL_BASED_OUTPATIENT_CLINIC_OR_DEPARTMENT_OTHER): Payer: 59 | Attending: General Surgery | Admitting: General Surgery

## 2022-08-06 DIAGNOSIS — T8131XD Disruption of external operation (surgical) wound, not elsewhere classified, subsequent encounter: Secondary | ICD-10-CM | POA: Insufficient documentation

## 2022-08-06 DIAGNOSIS — L59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: Secondary | ICD-10-CM | POA: Diagnosis not present

## 2022-08-06 DIAGNOSIS — Y842 Radiological procedure and radiotherapy as the cause of abnormal reaction of the patient, or of later complication, without mention of misadventure at the time of the procedure: Secondary | ICD-10-CM | POA: Insufficient documentation

## 2022-08-06 DIAGNOSIS — M313 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: Secondary | ICD-10-CM | POA: Diagnosis not present

## 2022-08-06 DIAGNOSIS — D0512 Intraductal carcinoma in situ of left breast: Secondary | ICD-10-CM | POA: Insufficient documentation

## 2022-08-06 NOTE — Progress Notes (Addendum)
MAURISSA, AMBROSE ([id number]) [id number] Page 1 of 2 Visit Report for 08/06/2022 HBO Details Patient Name: Date of Service: [name], [name] 08/06/2022 10:00 A M Medical Record Number: [id number] Patient Account Number: [id number] Date of Birth/Sex: Treating RN: February 18, 1971 (52 y.o. 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 HBO Treatment Details Treatment Number: 6 Patient Type: Outpatient Chamber Type: Monoplace Chamber Serial #: [id number] Treatment Protocol: 2.0 ATA with 90 minutes oxygen, and no air breaks Treatment Details Compression Rate Down: 2.0 psi / minute De-Compression Rate Up: Air breaks and breathing Decompress Decompress Compress Tx Pressure Begins Reached periods Begins Ends (leave unused spaces blank) Chamber Pressure (ATA 1 2 ------2 1 ) Clock Time (24 hr) 10:23 10:32 - - - - - - 12:02 12:09 Treatment Length: 106 (minutes) Treatment Segments: 4 Vital Signs Capillary Blood Glucose Reference Range: 80 - 120 mg / dl HBO Diabetic Blood Glucose Intervention Range: <131 mg/dl or >249 mg/dl Time Vitals Blood Respiratory Capillary Blood Glucose Pulse Action Type: Pulse: Temperature: Taken: Pressure: Rate: Glucose (mg/dl): Meter #: Oximetry (%) Taken: Pre 09:58 113/83 79 18 97.9 Post 12:11 115/80 79 16 97 Treatment Response Treatment Toleration: Well Treatment Completion Status: Treatment Completed without Adverse Event Physician HBO Attestation: I certify that I supervised this HBO treatment in accordance with Medicare guidelines. A trained emergency response team is  readily available per Yes hospital policies and procedures. Continue HBOT as ordered. Yes Electronic Signature(s) Signed: 08/06/2022 4:03:39 PM By: [name] MD FACS Previous Signature: 08/06/2022 1:27:18 PM Version By: Valeria Batman EMT Previous Signature: 08/06/2022 11:41:01 AM Version By: Valeria Batman EMT Entered By: [name] on 08/06/2022 16:03:39 [name] ([id number]) [id number] Page 2 of 2 -------------------------------------------------------------------------------- HBO Safety Checklist Details Patient Name: Date of Service: [name] 08/06/2022 10:00 A M Medical Record Number: [id number] Patient Account Number: [id number] Date of Birth/Sex: Treating RN: 1970/08/25 (53 y.o. [name] Primary Care [name]: [name] Other Clinician: Valeria Batman Referring Maxum Cassarino: Treating Taurus Alamo/Extender: [name], [name] Weeks in Treatment: 14 HBO Safety Checklist Items Safety Checklist Consent Form Signed Patient voided / foley secured and emptied When did you last eato 0920 Last dose of injectable or oral agent NA Ostomy pouch emptied and vented if applicable NA All implantable devices assessed, documented and approved NA Intravenous access site secured and place NA Valuables secured Linens and cotton and cotton/polyester blend (less than 51% polyester) Personal oil-based products / skin lotions / body lotions removed Wigs or hairpieces removed Smoking or tobacco materials removed Books / newspapers / magazines / loose paper removed Cologne, aftershave, perfume and deodorant removed Jewelry removed (may wrap wedding band) Make-up removed Hair care products removed Battery operated devices (external) removed Heating patches and chemical warmers removed Titanium eyewear removed NA Nail polish cured greater than 10 hours 2 weeks ago Casting material cured greater than 10 hours NA Hearing  aids removed NA Loose dentures or partials removed NA Prosthetics have been removed NA Patient demonstrates correct use of air break device (if applicable) Patient concerns have been addressed Patient grounding bracelet on and cord attached to chamber Specifics for Inpatients (complete in addition to above) Medication sheet sent with patient NA Intravenous medications needed  or due during therapy sent with patient NA Drainage tubes (e.g. nasogastric tube or chest tube secured and vented) NA Endotracheal or Tracheotomy tube secured NA Cuff deflated of air and inflated with saline NA Airway suctioned NA Notes The safety checklist was done before the treatment was started. Electronic Signature(s) Signed: 08/06/2022 11:39:43 AM By: Valeria Batman EMT Entered By: Valeria Batman on 08/06/2022 11:39:43

## 2022-08-06 NOTE — Progress Notes (Addendum)
[name] ([id number]) [id number] Page 1 of 2 Visit Report for 08/06/2022 Arrival Information Details Patient Name: Date of Service: [name] 08/06/2022 10:00 A M Medical Record Number: [id number] Patient Account Number: [id number] Date of Birth/Sex: Treating RN: Jun 12, 1971 (52 y.o. [name] Primary Care Brandyn Thien: [name] Other Clinician: Valeria Batman Referring Railey Glad: Treating Jahquan Klugh/Extender: Shea Stakes in Treatment: 14 Visit Information History Since Last Visit All ordered tests and consults were completed: Yes Patient Arrived: Ambulatory Added or deleted any medications: No Arrival Time: 09:53 Any new allergies or adverse reactions: No Accompanied By: None Had a fall or experienced change in No Transfer Assistance: None activities of daily living that may affect Patient Identification Verified: Yes risk of falls: Secondary Verification Process Completed: Yes Signs or symptoms of abuse/neglect since last visito No Patient Requires Transmission-Based Precautions: No Hospitalized since last visit: No Patient Has Alerts: No Implantable device outside of the clinic excluding No cellular tissue based products placed in the center since last visit: Pain Present Now: No Electronic Signature(s) Signed: 08/06/2022 11:25:34 AM By: Valeria Batman EMT Entered By: Valeria Batman on 08/06/2022 11:25:34 -------------------------------------------------------------------------------- Encounter Discharge Information Details Patient Name: Date of Service: [name] RD, [place] W. 08/06/2022 10:00 A M Medical Record Number: [id number] Patient Account Number: [id number] Date of Birth/Sex: Treating RN: 09-09-1970 (51 y.o. [name] Primary Care Lionardo Haze: [name] Other Clinician: Valeria Batman Referring Guilianna Mckoy: Treating Bach Rocchi/Extender: Shea Stakes in  Treatment: 34 Encounter Discharge Information Items Discharge Condition: Stable Ambulatory Status: Ambulatory Discharge Destination: Home Transportation: Private Auto Accompanied By: None Schedule Follow-up Appointment: Yes Clinical Summary of Care: Electronic Signature(s) Signed: 08/06/2022 1:40:20 PM By: Valeria Batman EMT Entered By: Valeria Batman on 08/06/2022 13:40:20 [name] ([id number]) [id number] Page 2 of 2 -------------------------------------------------------------------------------- Vitals Details Patient Name: Date of Service: [name] 08/06/2022 10:00 A M Medical Record Number: [id number] Patient Account Number: [id number] Date of Birth/Sex: Treating RN: [birth year] (52 y.o. [name] Primary Care [name]: [name] Other Clinician: Valeria Batman Referring Natsumi Whitsitt: Treating Torry Istre/Extender: [name], [name] Weeks in Treatment: 14 Vital Signs Time Taken: 09:58 Temperature (F): 97.9 Height (in): 63 Pulse (bpm): 79 Weight (lbs): 170 Respiratory Rate (breaths/min): 18 Body Mass Index (BMI): 30.1 Blood Pressure (mmHg): 113/83 Reference Range: 80 - 120 mg / dl Electronic Signature(s) Signed: 08/06/2022 11:26:03 AM By: Valeria Batman EMT Entered By: Valeria Batman on 08/06/2022 11:26:02

## 2022-08-06 NOTE — Progress Notes (Signed)
NEITA, LANDRIGAN ([id number]) [id number] Page 1 of 2 Visit Report for 08/06/2022 Problem List Details Patient Name: Date of Service: [name], [name] 08/06/2022 10:00 A M Medical Record Number: [id number] Patient Account Number: [id number] Date of Birth/Sex: Treating RN: 1971-05-03 (52 y.o. 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 08/06/2022 Medical Record Number: [id number] Patient Account Number: [id number] Date of Birth/Sex: Treating RN: 1971-04-08 (52 y.o. 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 Facility Procedures : CPT4 Code Description: [id number] G0277-(Facility Use Only) HBOT full body chamber, 64mn , ICD-10 Diagnosis Description L59.8 Other specified disorders of the skin and subcutaneous tissue related to rad S21.002D Unspecified open wound of left breast,  subsequent encounter T81.31XD Disruption of external operation (surgical) wound, not elsewhere classified, 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 Modifier: iation subsequent encou Quantity: 4 nter Physician Procedures : CPT4 Code Description Modifier [id number] 63817- WC PHYS HYPERBARIC OXYGEN THERAPY ICD-10 Diagnosis Description 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 S21.002D Unspecified open wound of left breast,  subsequent encounter T81.31XD Disruption of external operation (surgical) wound, not elsewhere classified, subsequent enco 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

## 2022-08-07 ENCOUNTER — Encounter (HOSPITAL_BASED_OUTPATIENT_CLINIC_OR_DEPARTMENT_OTHER): Payer: 59 | Admitting: General Surgery

## 2022-08-07 DIAGNOSIS — T8131XD Disruption of external operation (surgical) wound, not elsewhere classified, subsequent encounter: Secondary | ICD-10-CM | POA: Diagnosis not present

## 2022-08-07 NOTE — Progress Notes (Signed)
SHANARA, SCHNIEDERS ([id number]) [id number] Page 1 of 2 Visit Report for 08/07/2022 HBO Details Patient Name: Date of Service: [name], [name] 08/07/2022 10:00 A M Medical Record Number: [id number] Patient Account Number: [id number] Date of Birth/Sex: Treating RN: 05-13-1971 (52 y.o. 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 HBO Treatment Details Treatment Number: 7 Patient Type: Outpatient Chamber Type: Monoplace Chamber Serial #: [id number] Treatment Protocol: 2.0 ATA with 90 minutes oxygen, and no air breaks Treatment Details Compression Rate Down: 2.0 psi / minute De-Compression Rate Up: 2.0 psi / minute Air breaks and breathing Decompress Decompress Compress Tx Pressure Begins Reached periods Begins Ends (leave unused spaces blank) Chamber Pressure (ATA 1 2 ------2 1 ) Clock Time (24 hr) 09:55 10:03 - - - - - - 11:33 11:41 Treatment Length: 106 (minutes) Treatment Segments: 4 Vital Signs Capillary Blood Glucose Reference Range: 80 - 120 mg / dl HBO Diabetic Blood Glucose Intervention Range: <131 mg/dl or >249 mg/dl Time Vitals Blood Respiratory Capillary Blood Glucose Pulse Action Type: Pulse: Temperature: Taken: Pressure: Rate: Glucose (mg/dl): Meter #: Oximetry (%) Taken: Pre 09:47 103/79 75 18 97.9 Post 11:43 104/70 71 18 97.5 Treatment Response Treatment Toleration: Well Treatment Completion Status: Treatment Completed without Adverse Event Physician HBO Attestation: I certify that I supervised this HBO treatment in accordance with Medicare guidelines. A trained emergency  response team is readily available per Yes hospital policies and procedures. Continue HBOT as ordered. Yes Electronic Signature(s) Signed: 08/07/2022 12:39:06 PM By: [name] MD FACS Previous Signature: 08/07/2022 12:35:46 PM Version By: Valeria Batman EMT Entered By: [name] on 08/07/2022 12:39:06 [name] ([id number]) [id number] Page 2 of 2 -------------------------------------------------------------------------------- HBO Safety Checklist Details Patient Name: Date of Service: [name] 08/07/2022 10:00 A M Medical Record Number: [id number] Patient Account Number: [id number] Date of Birth/Sex: Treating RN: [birth year] (52 y.o. [name], Meta.Reding Primary Care Raymonde Hamblin: [name] Other Clinician: Valeria Batman Referring Rober Skeels: Treating Jaxiel Kines/Extender: [name], [name] Weeks in Treatment: 15 HBO Safety Checklist Items Safety Checklist Consent Form Signed Patient voided / foley secured and emptied When did you last eato 0800 Last dose of injectable or oral agent NA Ostomy pouch emptied and vented if applicable NA All implantable devices assessed, documented and approved NA Intravenous access site secured and place NA Valuables secured Linens and cotton and cotton/polyester blend (less than 51% polyester) Personal oil-based products / skin lotions / body lotions removed Wigs or hairpieces removed Smoking or tobacco materials removed Books / newspapers / magazines / loose paper removed Cologne, aftershave, perfume and deodorant removed Jewelry removed (may wrap wedding band) Make-up removed Hair care products removed Battery operated devices (external) removed Heating patches and chemical warmers removed Titanium eyewear removed Nail polish cured greater than 10 hours 2 weeks ago Casting material cured greater than 10 hours NA Hearing aids removed NA Loose dentures or partials  removed NA Prosthetics have been removed NA Patient demonstrates correct use of air break device (if applicable) Patient concerns have been addressed Patient grounding bracelet on and cord attached to chamber Specifics for Inpatients (complete in addition to above) Medication sheet sent with patient NA Intravenous medications needed or due during therapy sent with patient  NA Drainage tubes (e.g. nasogastric tube or chest tube secured and vented) NA Endotracheal or Tracheotomy tube secured NA Cuff deflated of air and inflated with saline NA Airway suctioned NA Notes The safety checklist was done before the treatment was started. Electronic Signature(s) Signed: 08/07/2022 12:33:21 PM By: Valeria Batman EMT Entered By: Valeria Batman on 08/07/2022 12:33:20

## 2022-08-07 NOTE — Progress Notes (Signed)
[name], [name] ([id number]) [id number] Page 1 of 2 Visit Report for 08/07/2022 Arrival Information Details Patient Name: Date of Service: [name], [name] 08/07/2022 10:00 A M Medical Record Number: [id number] Patient Account Number: [id number] Date of Birth/Sex: Treating RN: [birth year] (52 y.o. [name], Meta.Reding Primary Care Mandalyn Pasqua: [name] Other Clinician: Valeria Batman Referring Rajon Bisig: Treating Demi Trieu/Extender: Shea Stakes in Treatment: 15 Visit Information History Since Last Visit All ordered tests and consults were completed: Yes Patient Arrived: Ambulatory Added or deleted any medications: No Arrival Time: 09:40 Any new allergies or adverse reactions: No Accompanied By: None Had a fall or experienced change in No Transfer Assistance: None activities of daily living that may affect Patient Identification Verified: Yes risk of falls: Secondary Verification Process Completed: Yes Signs or symptoms of abuse/neglect since last visito No Patient Requires Transmission-Based Precautions: No Hospitalized since last visit: No Patient Has Alerts: No Implantable device outside of the clinic excluding No cellular tissue based products placed in the center since last visit: Pain Present Now: No Electronic Signature(s) Signed: 08/07/2022 12:31:02 PM By: Valeria Batman EMT Entered By: Valeria Batman on 08/07/2022 12:31:02 -------------------------------------------------------------------------------- Encounter Discharge Information Details Patient Name: Date of Service: [name], [place] W. 08/07/2022 10:00 A M Medical Record Number: [id number] Patient Account Number: [id number] Date of Birth/Sex: Treating RN: September 28, 1970 (52 y.o. [name] Primary Care Tydarius Yawn: [name] Other Clinician: Valeria Batman Referring Jourden Delmont: Treating Gila Lauf/Extender: Shea Stakes in Treatment:  15 Encounter Discharge Information Items Discharge Condition: Stable Ambulatory Status: Ambulatory Discharge Destination: Home Transportation: Private Auto Accompanied By: None Schedule Follow-up Appointment: Yes Clinical Summary of Care: Electronic Signature(s) Signed: 08/07/2022 12:37:18 PM By: Valeria Batman EMT Entered By: Valeria Batman on 08/07/2022 12:37:18 [name] ([id number]) [id number] Page 2 of 2 -------------------------------------------------------------------------------- Vitals Details Patient Name: Date of Service: [name], [name] 08/07/2022 10:00 A M Medical Record Number: [id number] Patient Account Number: [id number] Date of Birth/Sex: Treating RN: 07/14/1970 (52 y.o. [name], Meta.Reding Primary Care [name]: [name] Other Clinician: Valeria Batman Referring Zaeem Kandel: Treating Wyatt Galvan/Extender: [name], [name] Weeks in Treatment: 15 Vital Signs Time Taken: 09:47 Temperature (F): 97.9 Height (in): 63 Pulse (bpm): 75 Weight (lbs): 170 Respiratory Rate (breaths/min): 18 Body Mass Index (BMI): 30.1 Blood Pressure (mmHg): 103/79 Reference Range: 80 - 120 mg / dl Electronic Signature(s) Signed: 08/07/2022 12:32:01 PM By: Valeria Batman EMT Entered By: Valeria Batman on 08/07/2022 12:32:01

## 2022-08-07 NOTE — Progress Notes (Signed)
SEBASTIANA, WUEST ([id number]) [id number] Page 1 of 2 Visit Report for 08/07/2022 Problem List Details Patient Name: Date of Service: [name], [name] 08/07/2022 10:00 A M Medical Record Number: [id number] Patient Account Number: [id number] Date of Birth/Sex: Treating RN: 07-12-1970 (52 y.o. 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 08/07/2022 Medical Record Number: [id number] Patient Account Number: [id number] Date of Birth/Sex: Treating RN: 1971-06-17 (52 y.o. 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 Facility Procedures : CPT4 Code Description: [id number] G0277-(Facility Use Only) HBOT full body chamber, 28mn , ICD-10 Diagnosis Description L59.8 Other specified disorders of the skin and subcutaneous tissue related to rad S21.002D Unspecified open wound of left breast,  subsequent encounter T81.31XD Disruption of external operation (surgical) wound, not elsewhere classified, 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 Modifier: iation subsequent encou Quantity: 4 nter Physician Procedures : CPT4 Code Description Modifier [id number] 03013- WC PHYS HYPERBARIC OXYGEN THERAPY ICD-10 Diagnosis Description 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 S21.002D Unspecified open wound of left breast,  subsequent encounter T81.31XD Disruption of external operation (surgical) wound, not elsewhere classified, subsequent enco 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

## 2022-08-10 ENCOUNTER — Encounter (HOSPITAL_BASED_OUTPATIENT_CLINIC_OR_DEPARTMENT_OTHER): Payer: 59 | Admitting: General Surgery

## 2022-08-10 DIAGNOSIS — T8131XD Disruption of external operation (surgical) wound, not elsewhere classified, subsequent encounter: Secondary | ICD-10-CM | POA: Diagnosis not present

## 2022-08-10 NOTE — Progress Notes (Signed)
EMORIE, MCFATE ([id number]) [id number] Page 1 of 2 Visit Report for 08/10/2022 Problem List Details Patient Name: Date of Service: [name], [name] 08/10/2022 10:00 A M Medical Record Number: [id number] Patient Account Number: [id number] Date of Birth/Sex: Treating RN: 1970-10-04 (52 y.o. 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 08/10/2022 Medical Record Number: [id number] Patient Account Number: [id number] Date of Birth/Sex: Treating RN: [birth year] (52 y.o. 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 Facility Procedures : CPT4 Code Description: [id number] G0277-(Facility Use Only) HBOT full body chamber, 80mn , ICD-10 Diagnosis Description L59.8 Other specified disorders of the skin and subcutaneous tissue related to rad S21.002D Unspecified open wound of left breast,  subsequent encounter T81.31XD Disruption of external operation (surgical) wound, not elsewhere classified, 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 Modifier: iation subsequent encou Quantity: 4 nter Physician Procedures : CPT4 Code Description Modifier [id number] 93734- WC PHYS HYPERBARIC OXYGEN THERAPY ICD-10 Diagnosis Description 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 S21.002D Unspecified open wound of left breast,  subsequent encounter T81.31XD Disruption of external operation (surgical) wound, not elsewhere classified, subsequent enco 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

## 2022-08-10 NOTE — Progress Notes (Signed)
[name] ([id number]) [phone].pdf Page 1 of 2 Visit Report for 08/10/2022 Arrival Information Details Patient Name: Date of Service: [name] RDCandie Mile. 08/10/2022 10:00 A M Medical Record Number: [id number] Patient Account Number: [id number] Date of Birth/Sex: Treating RN: [birth year] (52 y.o. [name] Primary Care Stella Encarnacion: [name] Other Clinician: Valeria Batman Referring Cora Stetson: Treating Koehn Salehi/Extender: Shea Stakes in Treatment: 15 Visit Information History Since Last Visit All ordered tests and consults were completed: Yes Patient Arrived: Ambulatory Added or deleted any medications: No Arrival Time: 09:38 Any new allergies or adverse reactions: No Accompanied By: None Had a fall or experienced change in No Transfer Assistance: None activities of daily living that may affect Patient Identification Verified: Yes risk of falls: Secondary Verification Process Completed: Yes Signs or symptoms of abuse/neglect since last visito No Patient Requires Transmission-Based Precautions: No Hospitalized since last visit: No Patient Has Alerts: No Implantable device outside of the clinic excluding No cellular tissue based products placed in the center since last visit: Pain Present Now: No Electronic Signature(s) Signed: 08/10/2022 2:14:41 PM By: Valeria Batman EMT Entered By: Valeria Batman on 08/10/2022 14:14:41 -------------------------------------------------------------------------------- Encounter Discharge Information Details Patient Name: Date of Service: [name] RD, [name] 08/10/2022 10:00 A M Medical Record Number: [id number] Patient Account Number: [id number] Date of Birth/Sex: Treating RN: 1971-05-31 (52 y.o. [name] Primary Care Marvis Bakken: [name] Other Clinician: Valeria Batman Referring Kaelen Brennan: Treating Sheenah Dimitroff/Extender: Shea Stakes in  Treatment: 15 Encounter Discharge Information Items Discharge Condition: Stable Ambulatory Status: Ambulatory Discharge Destination: Home Transportation: Private Auto Accompanied By: None Schedule Follow-up Appointment: Yes Clinical Summary of Care: Electronic Signature(s) Signed: 08/10/2022 2:18:04 PM By: Valeria Batman EMT Entered By: Valeria Batman on 08/10/2022 14:18:04 [name] ([id number]) [id number] Page 2 of 2 -------------------------------------------------------------------------------- Vitals Details Patient Name: Date of Service: [name] RD, [name] 08/10/2022 10:00 A M Medical Record Number: [id number] Patient Account Number: [id number] Date of Birth/Sex: Treating RN: [birth year] (52 y.o. [name] Primary Care Cletus Mehlhoff: [name] Other Clinician: Valeria Batman Referring Kendre Sires: Treating Clarine [name]/Extender: [name], [name] Weeks in Treatment: 15 Vital Signs Time Taken: 09:50 Temperature (F): 97.2 Height (in): 63 Pulse (bpm): 75 Weight (lbs): 170 Respiratory Rate (breaths/min): 18 Body Mass Index (BMI): 30.1 Blood Pressure (mmHg): 135/73 Reference Range: 80 - 120 mg / dl Electronic Signature(s) Signed: 08/10/2022 2:15:03 PM By: Valeria Batman EMT Entered By: Valeria Batman on 08/10/2022 14:15:03

## 2022-08-11 ENCOUNTER — Encounter (HOSPITAL_BASED_OUTPATIENT_CLINIC_OR_DEPARTMENT_OTHER): Payer: 59 | Admitting: General Surgery

## 2022-08-11 DIAGNOSIS — T8131XD Disruption of external operation (surgical) wound, not elsewhere classified, subsequent encounter: Secondary | ICD-10-CM | POA: Diagnosis not present

## 2022-08-11 NOTE — Progress Notes (Signed)
ARVIE, VILLARRUEL ([id number]) [phone].pdf Page 1 of 2 Visit Report for 08/11/2022 Problem List Details Patient Name: Date of Service: [name], [name] 08/11/2022 10:00 A M Medical Record Number: [id number] Patient Account Number: [id number] Date of Birth/Sex: Treating RN: Jan 19, 1971 (52 y.o. 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 08/11/2022 Medical Record Number: [id number] Patient Account Number: [id number] Date of Birth/Sex: Treating RN: Nov 09, 1970 (52 y.o. 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 Facility Procedures : CPT4 Code Description: [id number]-(Facility Use Only) HBOT full body chamber, 13mn , ICD-10 Diagnosis Description L59.8 Other specified disorders of the skin and subcutaneous tissue related to rad S21.002D Unspecified open wound of left breast,  subsequent encounter T81.31XD Disruption of external operation (surgical) wound, not elsewhere classified, 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 Modifier: iation subsequent encou Quantity: 4 nter Physician Procedures : CPT4 Code Description Modifier [id number] PHYS HYPERBARIC OXYGEN THERAPY ICD-10 Diagnosis Description 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

## 2022-08-11 NOTE — Progress Notes (Signed)
CALLIOPE, DELANGEL ([id number]) [phone].pdf Page 1 of 2 Visit Report for 08/11/2022 Arrival Information Details Patient Name: Date of Service: [name] [name] Mile. 08/11/2022 10:00 A M Medical Record Number: [id number] Patient Account Number: [id number] Date of Birth/Sex: Treating RN: 1970-07-14 (52 y.o. [name], Linda Primary Care Ranferi Clingan: [name] Other Clinician: Valeria Batman Referring Kyrene Longan: Treating Prathik Aman/Extender: Shea Stakes in Treatment: 15 Visit Information History Since Last Visit All ordered tests and consults were completed: Yes Patient Arrived: Ambulatory Added or deleted any medications: No Arrival Time: 09:38 Any new allergies or adverse reactions: No Accompanied By: None Had a fall or experienced change in No Transfer Assistance: None activities of daily living that may affect Patient Identification Verified: Yes risk of falls: Secondary Verification Process Completed: Yes Signs or symptoms of abuse/neglect since last visito No Patient Requires Transmission-Based Precautions: No Hospitalized since last visit: No Patient Has Alerts: No Implantable device outside of the clinic excluding No cellular tissue based products placed in the center since last visit: Pain Present Now: No Electronic Signature(s) Signed: 08/11/2022 1:35:26 PM By: Valeria Batman EMT Entered By: Valeria Batman on 08/11/2022 13:35:26 -------------------------------------------------------------------------------- Encounter Discharge Information Details Patient Name: Date of Service: [name] Pigg [name], [name] 08/11/2022 10:00 A M Medical Record Number: [id number] Patient Account Number: [id number] Date of Birth/Sex: Treating RN: 11/15/1970 (52 y.o. [name] [name] Primary Care Arriyana Rodell: [name] Other Clinician: Valeria Batman Referring Charmayne Odell: Treating Carroll Ranney/Extender: Shea Stakes in  Treatment: 15 Encounter Discharge Information Items Discharge Condition: Stable Ambulatory Status: Ambulatory Discharge Destination: Home Transportation: Private Auto Accompanied By: None Schedule Follow-up Appointment: Yes Clinical Summary of Care: Electronic Signature(s) Signed: 08/11/2022 1:38:52 PM By: Valeria Batman EMT Entered By: Valeria Batman on 08/11/2022 13:38:52 [name] [name] ([id number]) [id number].pdf Page 2 of 2 -------------------------------------------------------------------------------- Vitals Details Patient Name: Date of Service: [name] [name] Mile. 08/11/2022 10:00 A M Medical Record Number: [id number] Patient Account Number: [id number] Date of Birth/Sex: Treating RN: [birth year] (52 y.o. [name] [name] Primary Care Trever Streater: [name] Other Clinician: Valeria Batman Referring Jakolby Sedivy: Treating Stirling Orton/Extender: [name], [name] Weeks in Treatment: 15 Vital Signs Time Taken: 10:03 Temperature (F): 98.2 Height (in): 63 Pulse (bpm): 79 Weight (lbs): 170 Respiratory Rate (breaths/min): 18 Body Mass Index (BMI): 30.1 Blood Pressure (mmHg): 116/71 Reference Range: 80 - 120 mg / dl Electronic Signature(s) Signed: 08/11/2022 1:35:50 PM By: Valeria Batman EMT Entered By: Valeria Batman on 08/11/2022 13:35:50

## 2022-08-11 NOTE — Progress Notes (Addendum)
[name], [name] ([id number]) [phone].pdf Page 1 of 2 Visit Report for 08/10/2022 HBO Details Patient Name: Date of Service: [name], [name] 08/10/2022 10:00 [place] Record Number: [id number] Patient Account Number: [id number] Date of Birth/Sex: Treating RN: Sep 04, 1970 (52 y.o. 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 HBO Treatment Details Treatment Number: 8 Patient Type: Outpatient Chamber Type: Monoplace Chamber Serial #: [id number] Treatment Protocol: 2.0 ATA with 90 minutes oxygen, and no air breaks Treatment Details Compression Rate Down: 2.0 psi / minute De-Compression Rate Up: 2.0 psi / minute Air breaks and breathing Decompress Decompress Compress Tx Pressure Begins Reached periods Begins Ends (leave unused spaces blank) Chamber Pressure (ATA 1 2 ------2 1 ) Clock Time (24 hr) 09:56 10:05 - - - - - - 11:35 11:43 Treatment Length: 107 (minutes) Treatment Segments: 4 Vital Signs Capillary Blood Glucose Reference Range: 80 - 120 mg / dl HBO Diabetic Blood Glucose Intervention Range: <131 mg/dl or >249 mg/dl Time Vitals Blood Respiratory Capillary Blood Glucose Pulse Action Type: Pulse: Temperature: Taken: Pressure: Rate: Glucose (mg/dl): Meter #: Oximetry (%) Taken: Pre 09:50 135/73 75 18 97.2 Post 11:46 124/79 78 18 98.1 Treatment Response Treatment Toleration: Well Treatment Completion Status: Treatment Completed without Adverse Event Physician HBO Attestation: I certify that I supervised this HBO treatment in accordance with Medicare guidelines. A trained emergency  response team is readily available per Yes hospital policies and procedures. Continue HBOT as ordered. Yes Electronic Signature(s) Signed: 08/11/2022 8:25:44 AM By: [name] MD FACS Previous Signature: 08/10/2022 4:28:29 PM Version By: [name] MD FACS Previous Signature: 08/10/2022 2:16:58 PM Version By: Valeria Batman EMT Entered By: [name] on 08/11/2022 08:25:44 [name] ([id number]) [id number].pdf Page 2 of 2 -------------------------------------------------------------------------------- HBO Safety Checklist Details Patient Name: Date of Service: [name] [name] 08/10/2022 10:00 A M Medical Record Number: [id number] Patient Account Number: [id number] Date of Birth/Sex: Treating RN: [birth year] (52 y.o. [name] Primary Care Fable Huisman: [name] Other Clinician: Valeria Batman Referring [name]: Treating Karington Zarazua/Extender: [name], [name] Weeks in Treatment: 15 HBO Safety Checklist Items Safety Checklist Consent Form Signed Patient voided / foley secured and emptied When did you last eato 0930 Last dose of injectable or oral agent NA Ostomy pouch emptied and vented if applicable NA All implantable devices assessed, documented and approved NA Intravenous access site secured and place NA Valuables secured Linens and cotton and cotton/polyester blend (less than 51% polyester) Personal oil-based products / skin lotions / body lotions removed Wigs or hairpieces removed Smoking or tobacco materials removed Books / newspapers / magazines / loose paper removed Cologne, aftershave, perfume and deodorant removed Jewelry removed (may wrap wedding band) Make-up removed Hair care products removed Battery operated devices (external) removed Heating patches and chemical warmers removed Titanium eyewear removed NA Nail polish cured greater than 10 hours 3 weeks ago Casting material cured greater than  10 hours NA Hearing aids removed NA Loose dentures or partials removed NA Prosthetics have been removed NA Patient demonstrates correct use of air break device (if applicable) Patient concerns have been addressed Patient grounding bracelet on and cord attached to chamber Specifics for Inpatients (complete in addition to above) Medication sheet sent with  patient NA Intravenous medications needed or due during therapy sent with patient NA Drainage tubes (e.g. nasogastric tube or chest tube secured and vented) NA Endotracheal or Tracheotomy tube secured NA Cuff deflated of air and inflated with saline NA Airway suctioned NA Notes The safety checklist was done before the treatment was started. Electronic Signature(s) Signed: 08/10/2022 2:16:05 PM By: Valeria Batman EMT Entered By: Valeria Batman on 08/10/2022 14:16:05

## 2022-08-11 NOTE — Progress Notes (Signed)
EVELIA, WASKEY ([id number]) [phone].pdf Page 1 of 2 Visit Report for 08/11/2022 HBO Details Patient Name: Date of Service: [name], [name] 08/11/2022 10:00 A M Medical Record Number: [id number] Patient Account Number: [id number] Date of Birth/Sex: Treating RN: 1971/03/01 (52 y.o. 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 HBO Treatment Details Treatment Number: 9 Patient Type: Outpatient Chamber Type: Monoplace Chamber Serial #: [id number] Treatment Protocol: 2.0 ATA with 90 minutes oxygen, and no air breaks Treatment Details Compression Rate Down: 2.0 psi / minute De-Compression Rate Up: 2.0 psi / minute Air breaks and breathing Decompress Decompress Compress Tx Pressure Begins Reached periods Begins Ends (leave unused spaces blank) Chamber Pressure (ATA 1 2 ------2 1 ) Clock Time (24 hr) 10:18 10:25 - - - - - - 11:55 12:04 Treatment Length: 106 (minutes) Treatment Segments: 4 Vital Signs Capillary Blood Glucose Reference Range: 80 - 120 mg / dl HBO Diabetic Blood Glucose Intervention Range: <131 mg/dl or >249 mg/dl Time Vitals Blood Respiratory Capillary Blood Glucose Pulse Action Type: Pulse: Temperature: Taken: Pressure: Rate: Glucose (mg/dl): Meter #: Oximetry (%) Taken: Pre 10:03 116/71 79 18 98.2 Post 12:06 129/95 82 18 98.1 Treatment Response Treatment Toleration: Well Treatment Completion Status: Treatment Completed without Adverse Event Physician HBO Attestation: I certify that I supervised this HBO treatment in accordance with Medicare guidelines. A trained emergency  response team is readily available per Yes hospital policies and procedures. Continue HBOT as ordered. Yes Electronic Signature(s) Signed: 08/11/2022 1:45:38 PM By: [name] Maudlin MD FACS Previous Signature: 08/11/2022 1:37:50 PM Version By: Valeria Batman EMT Entered By: [name] on 08/11/2022 13:45:38 [name], [name] ([id number]) [id number] Page 2 of 2 -------------------------------------------------------------------------------- HBO Safety Checklist Details Patient Name: Date of Service: [name] 08/11/2022 10:00 A M Medical Record Number: [id number] Patient Account Number: [id number] Date of Birth/Sex: Treating RN: 11/23/1970 (52 y.o. [name] Primary Care Alora Gorey: [name] Other Clinician: Valeria Batman Referring [name]: Treating [name]/Extender: [name], [name] Weeks in Treatment: 15 HBO Safety Checklist Items Safety Checklist Consent Form Signed Patient voided / foley secured and emptied When did you last eato 0930 Last dose of injectable or oral agent NA Ostomy pouch emptied and vented if applicable NA All implantable devices assessed, documented and approved NA Intravenous access site secured and place NA Valuables secured Linens and cotton and cotton/polyester blend (less than 51% polyester) Personal oil-based products / skin lotions / body lotions removed Wigs or hairpieces removed Smoking or tobacco materials removed Books / newspapers / magazines / loose paper removed Cologne, aftershave, perfume and deodorant removed Jewelry removed (may wrap wedding band) Make-up removed Hair care products removed Battery operated devices (external) removed Heating patches and chemical warmers removed Titanium eyewear removed NA Nail polish cured greater than 10 hours 3 weeks ago Casting material cured greater than 10 hours NA Hearing aids removed NA Loose dentures or partials  removed NA Prosthetics have been removed NA Patient demonstrates correct use of air break device (if applicable) Patient concerns have been addressed Patient grounding bracelet on and cord attached to chamber Specifics for Inpatients (complete in addition to above) Medication sheet sent with patient NA Intravenous medications needed or due during therapy sent with  patient NA Drainage tubes (e.g. nasogastric tube or chest tube secured and vented) NA Endotracheal or Tracheotomy tube secured NA Cuff deflated of air and inflated with saline NA Airway suctioned NA Notes The safety checklist was done before the treatment was started. Electronic Signature(s) Signed: 08/11/2022 1:36:49 PM By: Valeria Batman EMT Entered By: Valeria Batman on 08/11/2022 13:36:48

## 2022-08-12 ENCOUNTER — Encounter (HOSPITAL_BASED_OUTPATIENT_CLINIC_OR_DEPARTMENT_OTHER): Payer: 59 | Admitting: General Surgery

## 2022-08-12 DIAGNOSIS — T8131XD Disruption of external operation (surgical) wound, not elsewhere classified, subsequent encounter: Secondary | ICD-10-CM | POA: Diagnosis not present

## 2022-08-12 NOTE — Progress Notes (Signed)
DER, GAGLIANO ([id number]) [phone].pdf Page 1 of 2 Visit Report for 08/12/2022 Arrival Information Details Patient Name: Date of Service: [name], [name] 08/12/2022 10:00 A M Medical Record Number: [id number] Patient Account Number: [id number] Date of Birth/Sex: Treating RN: 01/17/1971 (52 y.o. [name], [place] Primary Care Jeffory Snelgrove: [name] Other Clinician: Valeria Batman Referring Mammie Meras: Treating Jatara Huettner/Extender: Shea Stakes in Treatment: 15 Visit Information History Since Last Visit All ordered tests and consults were completed: Yes Patient Arrived: Ambulatory Added or deleted any medications: No Arrival Time: 09:39 Any new allergies or adverse reactions: No Accompanied By: None Had a fall or experienced change in No Transfer Assistance: None activities of daily living that may affect Patient Identification Verified: Yes risk of falls: Secondary Verification Process Completed: Yes Signs or symptoms of abuse/neglect since last visito No Patient Requires Transmission-Based Precautions: No Hospitalized since last visit: No Patient Has Alerts: No Implantable device outside of the clinic excluding No cellular tissue based products placed in the center since last visit: Pain Present Now: No Electronic Signature(s) Signed: 08/12/2022 1:40:49 PM By: Valeria Batman EMT Entered By: Valeria Batman on 08/12/2022 13:40:49 -------------------------------------------------------------------------------- Encounter Discharge Information Details Patient Name: Date of Service: [name], [name]. 08/12/2022 10:00 A M Medical Record Number: [id number] Patient Account Number: [id number] Date of Birth/Sex: Treating RN: 11-15-1970 (52 y.o. [name] Primary Care Chaise Passarella: [name] Other Clinician: Valeria Batman Referring Shaneta Cervenka: Treating Serafin Decatur/Extender: Shea Stakes in Treatment:  15 Encounter Discharge Information Items Discharge Condition: Stable Ambulatory Status: Ambulatory Discharge Destination: Home Transportation: Private Auto Accompanied By: None Schedule Follow-up Appointment: Yes Clinical Summary of Care: Electronic Signature(s) Signed: 08/12/2022 1:44:21 PM By: Valeria Batman EMT Entered By: Valeria Batman on 08/12/2022 13:44:20 [name] ([id number]) [id number] Page 2 of 2 -------------------------------------------------------------------------------- Vitals Details Patient Name: Date of Service: [name], [name] 08/12/2022 10:00 A M Medical Record Number: [id number] Patient Account Number: [id number] Date of Birth/Sex: Treating RN: May 24, 1971 (52 y.o. [name], [name] Primary Care Makaila Windle: [name] Other Clinician: Valeria Batman Referring Tulsi Crossett: Treating Yasmyn Bellisario/Extender: [name], [name] Weeks in Treatment: 15 Vital Signs Time Taken: 09:57 Temperature (F): 98.2 Height (in): 63 Pulse (bpm): 76 Weight (lbs): 170 Respiratory Rate (breaths/min): 18 Body Mass Index (BMI): 30.1 Blood Pressure (mmHg): 118/73 Reference Range: 80 - 120 mg / dl Electronic Signature(s) Signed: 08/12/2022 1:41:13 PM By: Valeria Batman EMT Entered By: Valeria Batman on 08/12/2022 13:41:13

## 2022-08-13 ENCOUNTER — Encounter (HOSPITAL_BASED_OUTPATIENT_CLINIC_OR_DEPARTMENT_OTHER): Payer: 59 | Admitting: General Surgery

## 2022-08-13 DIAGNOSIS — T8131XA Disruption of external operation (surgical) wound, not elsewhere classified, initial encounter: Secondary | ICD-10-CM | POA: Diagnosis not present

## 2022-08-13 DIAGNOSIS — D0512 Intraductal carcinoma in situ of left breast: Secondary | ICD-10-CM | POA: Diagnosis not present

## 2022-08-13 DIAGNOSIS — T8131XD Disruption of external operation (surgical) wound, not elsewhere classified, subsequent encounter: Secondary | ICD-10-CM | POA: Diagnosis not present

## 2022-08-13 NOTE — Progress Notes (Signed)
BRALEIGH, MASSOUD ([id number]) [id number] Page 1 of 2 Visit Report for 08/13/2022 HBO Details Patient Name: Date of Service: [name], [name] 08/13/2022 10:00 A M Medical Record Number: [id number] Patient Account Number: [id number] Date of Birth/Sex: Treating RN: 01/15/1971 (52 y.o. 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 HBO Treatment Details Treatment Number: 11 Patient Type: Outpatient Chamber Type: Monoplace Chamber Serial #: [id number] Treatment Protocol: 2.0 ATA with 90 minutes oxygen, and no air breaks Treatment Details Compression Rate Down: 2.0 psi / minute De-Compression Rate Up: 2.0 psi / minute Air breaks and breathing Decompress Decompress Compress Tx Pressure Begins Reached periods Begins Ends (leave unused spaces blank) Chamber Pressure (ATA 1 2 ------2 1 ) Clock Time (24 hr) 10:35 10:44 - - - - - - 12:14 12:21 Treatment Length: 106 (minutes) Treatment Segments: 4 Vital Signs Capillary Blood Glucose Reference Range: 80 - 120 mg / dl HBO Diabetic Blood Glucose Intervention Range: <131 mg/dl or >249 mg/dl Time Vitals Blood Respiratory Capillary Blood Glucose Pulse Action Type: Pulse: Temperature: Taken: Pressure: Rate: Glucose (mg/dl): Meter #: Oximetry (%) Taken: Pre 09:45 110/76 80 16 97.4 Post 12:24 110/77 72 18 98.2 Treatment Response Treatment Toleration: Well Treatment Completion Status: Treatment Completed without Adverse Event Physician HBO Attestation: I certify that I supervised this HBO treatment in accordance with Medicare guidelines. A trained emergency  response team is readily available per Yes hospital policies and procedures. Continue HBOT as ordered. Yes Electronic Signature(s) Signed: 08/13/2022 3:14:10 PM By: [name] MD FACS Previous Signature: 08/13/2022 1:51:09 PM Version By: Valeria Batman EMT Entered By: [name] on 08/13/2022 15:14:10 [name] ([id number]) [id number] Page 2 of 2 -------------------------------------------------------------------------------- HBO Safety Checklist Details Patient Name: Date of Service: [name] 08/13/2022 10:00 A M Medical Record Number: [id number] Patient Account Number: [id number] Date of Birth/Sex: Treating RN: October 31, 1970 (52 y.o. [name], Meta.Reding Primary Care Marki Frede: [name] Other Clinician: Valeria Batman Referring Huber Mathers: Treating Ramata Strothman/Extender: [name], [name] Weeks in Treatment: 15 HBO Safety Checklist Items Safety Checklist Consent Form Signed Patient voided / foley secured and emptied When did you last eato 0900 Last dose of injectable or oral agent NA Ostomy pouch emptied and vented if applicable NA All implantable devices assessed, documented and approved NA Intravenous access site secured and place NA Valuables secured Linens and cotton and cotton/polyester blend (less than 51% polyester) Personal oil-based products / skin lotions / body lotions removed Wigs or hairpieces removed Smoking or tobacco materials removed Books / newspapers / magazines / loose paper removed Cologne, aftershave, perfume and deodorant removed Jewelry removed (may wrap wedding band) Make-up removed Hair care products removed Battery operated devices (external) removed Heating patches and chemical warmers removed Titanium eyewear removed NA Nail polish cured greater than 10 hours 3 weeks ago Casting material cured greater than 10 hours NA Hearing aids removed NA Loose dentures or partials  removed NA Prosthetics have been removed NA Patient demonstrates correct use of air break device (if applicable) Patient concerns have been addressed Patient grounding bracelet on and cord attached to chamber Specifics for Inpatients (complete in addition to above) Medication sheet sent with patient NA Intravenous medications needed or due during therapy sent with  patient NA Drainage tubes (e.g. nasogastric tube or chest tube secured and vented) NA Endotracheal or Tracheotomy tube secured NA Cuff deflated of air and inflated with saline NA Airway suctioned NA Notes The safety checklist was done before the treatment was started. Electronic Signature(s) Signed: 08/13/2022 1:48:01 PM By: Valeria Batman EMT Entered By: Valeria Batman on 08/13/2022 13:48:01

## 2022-08-13 NOTE — Progress Notes (Signed)
DYAMON, SOSINSKI ([id number]) [phone].pdf Page 1 of 2 Visit Report for 08/13/2022 Problem List Details Patient Name: Date of Service: [name], [name] 08/13/2022 10:00 A M Medical Record Number: [id number] Patient Account Number: [id number] Date of Birth/Sex: Treating RN: Mar 31, 1971 (52 y.o. 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 08/13/2022 Medical Record Number: [id number] Patient Account Number: [id number] Date of Birth/Sex: Treating RN: Jun 14, 1971 (52 y.o. 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 Facility Procedures : CPT4 Code Description: [id number]-(Facility Use Only) HBOT full body chamber, 48mn , ICD-10 Diagnosis Description L59.8 Other specified disorders of the skin and subcutaneous tissue related to rad S21.002D Unspecified open wound of left breast,  subsequent encounter T81.31XD Disruption of external operation (surgical) wound, not elsewhere classified, 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 Modifier: iation subsequent encou Quantity: 4 nter Physician Procedures : CPT4 Code Description Modifier [id number]- [id number] PHYS HYPERBARIC OXYGEN THERAPY ICD-10 Diagnosis Description 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

## 2022-08-13 NOTE — Progress Notes (Signed)
LOIDA, CALAMIA ([id number]) [phone].pdf Page 1 of 2 Visit Report for 08/13/2022 Arrival Information Details Patient Name: Date of Service: [name], [name] 08/13/2022 10:00 A M Medical Record Number: [id number] Patient Account Number: [id number] Date of Birth/Sex: Treating RN: 07-30-1970 (52 y.o. [name], Meta.Reding Primary Care Gagandeep Kossman: [name] Other Clinician: Valeria Batman Referring Kiyomi Pallo: Treating Jasimine Simms/Extender: Shea Stakes in Treatment: 15 Visit Information History Since Last Visit All ordered tests and consults were completed: Yes Patient Arrived: Ambulatory Added or deleted any medications: No Arrival Time: 09:40 Any new allergies or adverse reactions: No Accompanied By: None Had a fall or experienced change in No Transfer Assistance: None activities of daily living that may affect Patient Identification Verified: Yes risk of falls: Secondary Verification Process Completed: Yes Signs or symptoms of abuse/neglect since last visito No Patient Requires Transmission-Based Precautions: No Hospitalized since last visit: No Patient Has Alerts: No Implantable device outside of the clinic excluding No cellular tissue based products placed in the center since last visit: Pain Present Now: No Electronic Signature(s) Signed: 08/13/2022 1:24:07 PM By: Valeria Batman EMT Entered By: Valeria Batman on 08/13/2022 13:24:07 -------------------------------------------------------------------------------- Encounter Discharge Information Details Patient Name: Date of Service: [name] Pigg [name], [name] 08/13/2022 10:00 A M Medical Record Number: [id number] Patient Account Number: [id number] Date of Birth/Sex: Treating RN: [birth year] (52 y.o. [name] Primary Care Austyn Seier: [name] Other Clinician: Valeria Batman Referring Ludmilla Mcgillis: Treating Haider Hornaday/Extender: Shea Stakes in Treatment:  15 Encounter Discharge Information Items Discharge Condition: Stable Ambulatory Status: Ambulatory Discharge Destination: Home Transportation: Private Auto Accompanied By: None Schedule Follow-up Appointment: Yes Clinical Summary of Care: Electronic Signature(s) Signed: 08/13/2022 1:52:17 PM By: Valeria Batman EMT Entered By: Valeria Batman on 08/13/2022 13:52:17 [name] ([id number]) [id number] Page 2 of 2 -------------------------------------------------------------------------------- Vitals Details Patient Name: Date of Service: [name], [name] 08/13/2022 10:00 A M Medical Record Number: [id number] Patient Account Number: [id number] Date of Birth/Sex: Treating RN: 09-Sep-1970 (52 y.o. [name], Meta.Reding Primary Care Sela Falk: [name] Other Clinician: Valeria Batman Referring Cristian Davitt: Treating Tivis Wherry/Extender: [name], [name] Weeks in Treatment: 15 Vital Signs Time Taken: 09:45 Temperature (F): 97.4 Height (in): 63 Pulse (bpm): 80 Weight (lbs): 170 Respiratory Rate (breaths/min): 16 Body Mass Index (BMI): 30.1 Blood Pressure (mmHg): 110/76 Reference Range: 80 - 120 mg / dl Electronic Signature(s) Signed: 08/13/2022 1:46:06 PM By: Valeria Batman EMT Entered By: Valeria Batman on 08/13/2022 13:46:05

## 2022-08-13 NOTE — Progress Notes (Addendum)
OLANA, BARTOLINI ([id number]) [id number].pdf Page 1 of 9 Visit Report for 08/13/2022 Chief Complaint Document Details Patient Name: Date of Service: [name] RDCandie Mile. 08/13/2022 9:15 A M Medical Record Number: [id number] Patient Account Number: [id number] Date of Birth/Sex: Treating RN: August 11, 1970 (52 y.o. F) Primary Care Provider: [name] Other Clinician: Referring Provider: Treating Provider/Extender: [name], [name] in Treatment: 15 Information Obtained from: Patient Chief Complaint 04/24/2022; patient is here for a surgical wound on her left lateral breast Electronic Signature(s) Signed: 08/13/2022 9:36:32 AM By: [name] MD FACS Entered By: [name] on 08/13/2022 09:36:32 -------------------------------------------------------------------------------- Debridement Details Patient Name: Date of Service: [name] Mattes. 08/13/2022 9:15 A M Medical Record Number: [id number] Patient Account Number: [id number] Date of Birth/Sex: Treating RN: [birth year] (52 y.o. [name] Primary Care Provider: [name] Other Clinician: Referring Provider: Treating Provider/Extender: Shea Stakes in Treatment: 15 Debridement Performed for Assessment: Wound #1 Left Breast Performed By: Physician [name], MD Debridement Type: Debridement Level of Consciousness (Pre-procedure): Awake and Alert Pre-procedure Verification/Time Out Yes - 09:35 Taken: Start Time: 09:35 Pain Control: Lidocaine 4% T opical Solution T Area Debrided (L x W): otal 1.5 (cm) x 1 (cm) = 1.5 (cm) Tissue and other material debrided: Non-Viable, Slough, Slough Level: Non-Viable Tissue Debridement Description: Selective/Open Wound Instrument: Curette Bleeding: Minimum Hemostasis Achieved: Pressure Response to Treatment: Procedure was tolerated well Level of Consciousness (Post- Awake and  Alert procedure): Post Debridement Measurements of Total Wound Length: (cm) 1.5 Width: (cm) 1 Depth: (cm) 0.1 Volume: (cm) 0.118 Character of Wound/Ulcer Post Debridement: Improved Post Procedure Diagnosis Same as Pre-procedure [name], [name] ([id number]) [id number] Page 2 of 9 Notes scribed for Dr. Celine [name] by [name], RN Electronic Signature(s) Signed: 08/13/2022 10:55:37 AM By: [name] MD FACS Signed: 08/13/2022 3:35:42 PM By: Sabas Sous By: [name] on 08/13/2022 09:40:59 -------------------------------------------------------------------------------- HPI Details Patient Name: Date of Service: [name], [name] W. 08/13/2022 9:15 A M Medical Record Number: [id number] Patient Account Number: [id number] Date of Birth/Sex: Treating RN: October 21, 1970 (52 y.o. F) Primary Care Provider: [name] Other Clinician: Referring Provider: Treating Provider/Extender: [name], [name] in Treatment: 15 History of Present Illness HPI Description: ADMISSION 05/25/2022 This is a 52 year old woman who is found to have an abnormal mammogram of her left breast earlier this year showing a 13 mm group of calcifications in the upper quadrant of the left breast. 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 She underwent a left lumpectomy on 11/12/2021. The patient states the wound never really healed and was open. She underwent radiation therapy from 12/10/2021 through 01/19/22 28 fractions of 1.8GY. Essentially the wound would not heal. She was treated several times with antibiotics finally on 03/25/2022 she was taken to the OR by Dr. Barry Dienes for excision of the wound in a sinus. Operative cultures grew strep and Prevotella and a culture from the clinic Livedo MRSA. It was recommended for 4 [name] of Augmentin and doxycycline by infectious  disease. She was seen by wound care and she has been treating the wound with Medihoney ever since. 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 She was on meth methotrexate 20 mg once  a week however this I think was put on hold because of the wound followed by Dr. Posey Pronto of rheumatology. She has asthma. Most recent breast ultrasound was on 9/7 She has a large nonhealing wound on the lateral aspect of her left breast 04/30/2022: Although I do not have a photograph to compare to last week, the RN report is that it is substantially cleaner. She still has ample slough and nonviable fat and subcutaneous tissue present. She only was able to get her Santyl on Monday so she has just had a couple of days of treatment. 05/08/2022: The wound dimensions are about the same, but the surface is substantially cleaner. There is just a little bit of slough accumulation on the surface. 05/18/2022: The wound is a little bit smaller, but not much. It is quite a bit cleaner, however. 05/25/2022: The wound surface continues to improve. Very little slough accumulation this week. No substantial change in the wound dimensions. 06/01/2022: The wound surface continues to have a layer of slough on it. There is a crack in the surface near the 12 o'clock position that when further explored, revealed a cavity and tunnel that is about 2 cm deep. No purulent drainage or concern for infection. 12/11; left breast wound. The wound itself is somewhat smaller in size per our measurements. However she has a divot at 1:00 that measures about a 1.5 cm deep. We have been using iodoform packing and if it and Santyl to the rest of the wound This wound was initially had a left lumpectomy on 11/12/2021 she  underwent 28 fractions of radiation. She potentially could benefit from hyperbaric oxygen 12/18; left breast wound. Better looking wound surface and improvement in measurements of the small tunnel. We have been using Santyl and iodoform. 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 Underneath the layer of slough, the tissue is much healthier-looking, with better color and some granulation tissue beginning to form. 07/13/2022: The wound continues to fill with better looking granulation tissue. There is very little slough accumulation and the cavity is shallower. We are going to submit for insurance approval for hyperbaric oxygen therapy, as it has now been 6 months since the time of her initial wound and radiation therapy. 07/20/2022: The cavity continues to fill in and the surface of the wound is improving. EKG and chest x-ray are complete and we are just awaiting insurance approval for hyperbarics. 07/27/2022: The cavity has filled in considerably and the surface of the wound is very clean with just a thin layer of [name] slough. We are still awaiting insurance approval for hyperbaric oxygen therapy. 08/03/2022: She was approved for hyperbaric oxygen therapy and received 2 treatments last week. It is rather remarkable the improvement already in her wound. The cavity continues to fill and there is epithelialization occurring around the edges of her wound. Minimal slough accumulation. 08/13/2022: Her wound is smaller and is epithelializing. There is no longer a tunnel or tract to pack. There is just slight slough accumulation on the surface. Electronic Signature(s) Signed: 08/13/2022 9:37:06 AM By: [name] MD FACS [name], [name] ([id number]) By: [name] MD FACS  [phone].pdf Page 3 of 9 Signed: 08/13/2022 9:37:06 AM Entered By: [name] on 08/13/2022 09:37:05 -------------------------------------------------------------------------------- Physical Exam Details Patient Name: Date of Service: [name], STEPHA  NIE W. 08/13/2022 9:15 A M Medical Record Number: [id number] Patient Account Number: [id number] Date of Birth/Sex: Treating RN: 11/13/1970 (52 y.o. F) Primary Care Provider: [name] Other Clinician: Referring Provider: Treating Provider/Extender: [name], [name] in Treatment: 15 Constitutional . . . . no acute distress. Respiratory Normal work of breathing on room air. Notes 08/13/2022: Her wound is smaller and is epithelializing. There is no longer a tunnel or tract to pack. There is just slight slough accumulation on the surface. Electronic Signature(s) Signed: 08/13/2022 9:37:30 AM By: [name] MD FACS Entered By: [name] on 08/13/2022 09:37:30 -------------------------------------------------------------------------------- Physician Orders Details Patient Name: Date of Service: [name] Mattes. 08/13/2022 9:15 A M Medical Record Number: [id number] Patient Account Number: [id number] Date of Birth/Sex: Treating RN: 10/15/1970 (52 y.o. [name] Primary Care Provider: [name] Other Clinician: Referring Provider: Treating Provider/Extender: Shea Stakes in Treatment: 15 Verbal / Phone Orders: No Diagnosis Coding ICD-10 Coding Code Description S21.002D Unspecified open wound of left breast, subsequent encounter T81.31XD Disruption of external operation (surgical) wound, not elsewhere classified, subsequent encounter 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 Follow-up Appointments ppointment in 1 week. - Dr. Celine [name] Room  2 Return A Anesthetic (In clinic) Topical Lidocaine 4% applied to wound bed Bathing/ Shower/ Hygiene Other Bathing/Shower/Hygiene Orders/Instructions: - Change dressing after bathing Edema Control - Lymphedema / SCD / Other Other Edema Control Orders/Instructions: - Keep doing the Lymphadema checks Hyperbaric Oxygen Therapy Evaluate for HBO Therapy [name], [name] ([id number]) [id number] Page 4 of 9 Indication: - soft tissue radionecrosis If appropriate for treatment, begin HBOT per protocol: 2.0 ATA for 90 Minutes without A Breaks ir Total Number of Treatments: - 40 One treatments per day (delivered Monday through Friday unless otherwise specified in Special Instructions below): A frin (Oxymetazoline HCL) 0.05% nasal spray - 1 spray in both nostrils daily as needed prior to HBO treatment for difficulty clearing ears Wound Treatment Wound #1 - Breast Wound Laterality: Left Cleanser: Normal Saline (Generic) 1 x Per Day/30 Days Discharge Instructions: Cleanse the wound with Normal Saline prior to applying a clean dressing using gauze sponges, not tissue or cotton balls. Cleanser: Soap and Water 1 x Per Day/30 Days Discharge Instructions: May shower and wash wound with dial antibacterial soap and water prior to dressing change. Cleanser: Byram Ancillary Kit - 15 Day Supply (Generic) 1 x Per Day/30 Days Discharge Instructions: Use supplies as instructed; Kit contains: (15) Saline Bullets; (15) 3x3 Gauze; 15 pr Gloves Prim Dressing: Promogran Prisma Matrix, 4.34 (sq in) (silver collagen) (Dispense As Written) 1 x Per Day/30 Days ary Discharge Instructions: Moisten collagen with saline or hydrogel Secondary Dressing: Zetuvit Plus Silicone Border Dressing 5x5 (in/in) (Dispense As Written) 1 x Per Day/30 Days Discharge Instructions: Apply silicone border over primary dressing as directed. Patient Medications llergies: kiwi, Ativan, erythromycin base, Xanax,  nitrofurantoin A Notifications Medication Indication Start End 08/13/2022 lidocaine DOSE topical 4 % cream - cream topical Electronic Signature(s) Signed: 08/13/2022 10:55:37 AM By: [name] MD FACS Entered By: [name] on 08/13/2022 09:37:59 -------------------------------------------------------------------------------- Problem List Details Patient Name: Date of Service: [name] 08/13/2022 9:15 A M Medical Record Number: [id number] Patient Account Number: [id number] Date of Birth/Sex: Treating RN: [birth year] (52 y.o. F) Primary Care Provider: [name] Other Clinician: Referring Provider: Treating Provider/Extender: [name], [name] in Treatment: 15 Active Problems  ICD-10 Encounter Code Description Active Date MDM Diagnosis S21.002D Unspecified open wound of left breast, subsequent encounter 04/24/2022 No Yes T81.31XD Disruption of external operation (surgical) wound, not elsewhere classified, 04/24/2022 No Yes subsequent encounter L59.8 Other specified disorders of the skin and subcutaneous tissue related to 04/24/2022 No Yes radiation 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 04/24/2022 No Yes [name], [name] ([id number]) [phone].pdf Page 5 of 9 Inactive Problems Resolved Problems Electronic Signature(s) Signed: 08/13/2022 9:35:43 AM By: [name] MD FACS Entered By: [name] on 08/13/2022 09:35:43 -------------------------------------------------------------------------------- Progress Note Details Patient Name: Date of Service: [name] Mattes. 08/13/2022 9:15 A M Medical Record Number: [id number] Patient Account Number: [id number] Date of Birth/Sex: Treating RN: [birth year] (52 y.o. F) Primary Care Provider: [name] Other Clinician: Referring Provider: Treating Provider/Extender: [name], [name] in Treatment: 15 Subjective Chief  Complaint Information obtained from Patient 04/24/2022; patient is here for a surgical wound on her left lateral breast History of Present Illness (HPI) ADMISSION 05/25/2022 This is a 52 year old woman who is found to have an abnormal mammogram of her left breast earlier this year showing a 13 mm group of calcifications in the upper quadrant of the left breast. 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 She underwent a left lumpectomy on 11/12/2021. The patient states the wound never really healed and was open. She underwent radiation therapy from 12/10/2021 through 01/19/22 28 fractions of 1.8GY. Essentially the wound would not heal. She was treated several times with antibiotics finally on 03/25/2022 she was taken to the OR by Dr. Barry Dienes for excision of the wound in a sinus. Operative cultures grew strep and Prevotella and a culture from the clinic [place] MRSA. It was recommended for 4 [name] of Augmentin and doxycycline by infectious disease. She was seen by wound care and she has been treating the wound with Medihoney ever since. 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 She was on meth methotrexate 20 mg once a week however this I think was put on hold because of the wound followed by Dr. Posey Pronto of rheumatology. She has asthma. Most recent breast ultrasound was on 9/7 She has a large nonhealing wound on the lateral aspect of her left breast 04/30/2022: Although I do not have a photograph to compare to last week, the RN report is that it is substantially cleaner. She still has ample slough and nonviable fat and subcutaneous tissue present. She only was able to get her Santyl on Monday  so she has just had a couple of days of treatment. 05/08/2022: The wound dimensions are about the same, but the surface is substantially cleaner. There is just a little bit of slough accumulation on the surface. 05/18/2022: The wound is a little bit smaller, but not much. It is quite a bit cleaner, however. 05/25/2022: The wound surface continues to improve. Very little slough accumulation this week. No substantial change in the wound dimensions. 06/01/2022: The wound surface continues to have a layer of slough on it. There is a crack in the surface near the 12 o'clock position that when further explored, revealed a cavity and tunnel that is about 2 cm deep. No purulent drainage or concern for infection. 12/11; left  breast wound. The wound itself is somewhat smaller in size per our measurements. However she has a divot at 1:00 that measures about a 1.5 cm deep. We have been using iodoform packing and if it and Santyl to the rest of the wound This wound was initially had a left lumpectomy on 11/12/2021 she underwent 28 fractions of radiation. She potentially could benefit from hyperbaric oxygen 12/18; left breast wound. Better looking wound surface and improvement in measurements of the small tunnel. We have been using Santyl and iodoform. 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 Underneath the layer of slough, the tissue is much healthier-looking, with better color and some granulation tissue beginning to form. 07/13/2022: The wound continues to fill with better looking granulation tissue. There is very little slough accumulation and the cavity is shallower. We are going to submit for insurance approval for hyperbaric oxygen therapy, as it has now been 6 months since the time of her initial wound  and radiation therapy. 07/20/2022: The cavity continues to fill in and the surface of the wound is improving. EKG and chest x-ray are complete and we are just awaiting insurance approval for hyperbarics. 07/27/2022: The cavity has filled in considerably and the surface of the wound is very clean with just a thin layer of [name] slough. We are still awaiting insurance approval for hyperbaric oxygen therapy. [name], [name] ([id number]) [phone].pdf Page 6 of 9 08/03/2022: She was approved for hyperbaric oxygen therapy and received 2 treatments last week. It is rather remarkable the improvement already in her wound. The cavity continues to fill and there is epithelialization occurring around the edges of her wound. Minimal slough accumulation. 08/13/2022: Her wound is smaller and is epithelializing. There is no longer a tunnel or tract to pack. There is just slight slough accumulation on the surface. Patient History Information obtained from Patient. Social History Never smoker, Marital Status - Married, Alcohol Use - Moderate, Drug Use - No History, Caffeine Use - Daily. Medical History Hematologic/Lymphatic Patient has history of Lymphedema - Left breast-gets Lymphadema tx Hospitalization/Surgery History - 03/25/22 Left Breast cyst excision; 11/12/21 Left Breast Lumpectomy with Radioactive seeds;Left breat Biopsy;Anterior. 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 12/08/21- 01/29/22- 37 rounds of Radiation Psychiatric General Anxiety Objective Constitutional no acute distress. Vitals Time Taken: 9:15 AM, Height: 63 in, Weight: 170 lbs, BMI: 30.1, Temperature: 98.9 F, Pulse: 74 bpm, Respiratory Rate: 20 breaths/min, Blood Pressure: 131/83 mmHg. Respiratory Normal work of breathing on room  air. General Notes: 08/13/2022: Her wound is smaller and is epithelializing. There is no longer a tunnel or tract to pack. There is just slight slough accumulation on the surface. Integumentary (Hair, Skin) Wound #1 status is Open. Original cause of wound was Surgical Injury. The date acquired was: 04/03/2022. The wound has been in treatment 15 [name] The wound is located on the Left Breast. The wound measures 1.5cm length x 1cm width x 0.1cm depth; 1.178cm^2 area and 0.118cm^3 volume. There is Fat Layer (Subcutaneous Tissue) exposed. There is no tunneling or undermining noted. There is a medium amount of serosanguineous drainage noted. The wound margin is distinct with the outline attached to the wound base. There is  large (67-100%) red, pink granulation within the wound bed. There is a small (1-33%) amount of necrotic tissue within the wound bed including Adherent Slough. The periwound skin appearance had no abnormalities noted for moisture. The periwound skin appearance had no abnormalities noted for color. The periwound skin appearance exhibited: Induration. Periwound temperature was noted as No Abnormality. Assessment Active Problems ICD-10 Unspecified open wound of left breast, subsequent encounter Disruption of external operation (surgical) wound, not elsewhere classified, subsequent encounter Other specified disorders of the skin and subcutaneous tissue related to radiation Intraductal carcinoma in situ of left breast Procedures Wound #1 Pre-procedure diagnosis of Wound #1 is a Malignant Wound located on the Left Breast . There was a Selective/Open Wound Non-Viable Tissue Debridement [name], [name] ([id number]) [phone].pdf Page 7 of 9 with a total area of 1.5 sq cm performed by [name], MD. With the following instrument(s): Curette to remove Non-Viable tissue/material. Material removed includes [facility] after achieving pain control using Lidocaine 4%  Topical Solution. No specimens were taken. A time out was conducted at 09:35, prior to the start of the procedure. A Minimum amount of bleeding was controlled with Pressure. The procedure was tolerated well. Post Debridement Measurements: 1.5cm length x 1cm width x 0.1cm depth; 0.118cm^3 volume. Character of Wound/Ulcer Post Debridement is improved. Post procedure Diagnosis Wound #1: Same as Pre-Procedure General Notes: scribed for Dr. Celine [name] by [name], RN. Plan Follow-up Appointments: Return Appointment in 1 week. - Dr. Celine [name] Room 2 Anesthetic: (In clinic) Topical Lidocaine 4% applied to wound bed Bathing/ Shower/ Hygiene: Other Bathing/Shower/Hygiene Orders/Instructions: - Change dressing after bathing Edema Control - Lymphedema / SCD / Other: Other Edema Control Orders/Instructions: - Keep doing the Lymphadema checks Hyperbaric Oxygen Therapy: Evaluate for HBO Therapy Indication: - soft tissue radionecrosis If appropriate for treatment, begin HBOT per protocol: 2.0 ATA for 90 Minutes without Air Breaks T Number of Treatments: - 40 otal One treatments per day (delivered Monday through Friday unless otherwise specified in Special Instructions below): Afrin (Oxymetazoline HCL) 0.05% nasal spray - 1 spray in both nostrils daily as needed prior to HBO treatment for difficulty clearing ears The following medication(s) was prescribed: lidocaine topical 4 % cream cream topical was prescribed at facility WOUND #1: - Breast Wound Laterality: Left Cleanser: Normal Saline (Generic) 1 x Per Day/30 Days Discharge Instructions: Cleanse the wound with Normal Saline prior to applying a clean dressing using gauze sponges, not tissue or cotton balls. Cleanser: Soap and Water 1 x Per Day/30 Days Discharge Instructions: May shower and wash wound with dial antibacterial soap and water prior to dressing change. Cleanser: Byram Ancillary Kit - 15 Day Supply (Generic) 1 x Per Day/30  Days Discharge Instructions: Use supplies as instructed; Kit contains: (15) Saline Bullets; (15) 3x3 Gauze; 15 pr Gloves Prim Dressing: Promogran Prisma Matrix, 4.34 (sq in) (silver collagen) (Dispense As Written) 1 x Per Day/30 Days ary Discharge Instructions: Moisten collagen with saline or hydrogel Secondary Dressing: Zetuvit Plus Silicone Border Dressing 5x5 (in/in) (Dispense As Written) 1 x Per Day/30 Days Discharge Instructions: Apply silicone border over primary dressing as directed. 08/13/2022: Her wound is smaller and is epithelializing. There is no longer a tunnel or tract to pack. There is just slight slough accumulation on the surface. I used a curette to debride the slough off of the wound surface. She no longer has any cavity left to pack. We will continue Prisma silver collagen with a foam border dressing. She will continue her hyperbaric oxygen therapy. She  is responding exceptionally well to this treatment. Follow-up in 1 week. Electronic Signature(s) Signed: 08/14/2022 8:13:27 AM By: [name] MD FACS Previous Signature: 08/13/2022 9:38:33 AM Version By: [name] MD FACS Entered By: [name] on 08/14/2022 08:13:27 -------------------------------------------------------------------------------- HxROS Details Patient Name: Date of Service: [name], [place] W. 08/13/2022 9:15 A M Medical Record Number: [id number] Patient Account Number: [id number] Date of Birth/Sex: Treating RN: 07/27/1970 (52 y.o. F) Primary Care Provider: [name] Other Clinician: Referring Provider: Treating Provider/Extender: Shea Stakes in Treatment: 15 Information Obtained From Patient Ear/Nose/Mouth/Throat Medical History: Past Medical History Notes: Left ear limited hearing [name], [name] ([id number]) [id number] Page 8 of 9 Hematologic/Lymphatic Medical History: Positive for: Lymphedema - Left breast-gets Lymphadema  tx Gastrointestinal Medical History: Past Medical History Notes: GERD Immunological Medical History: Past Medical History Notes: Wegener"s Granulomatosis Musculoskeletal Medical History: Past Medical History Notes: "RA like arthritis" as per patient Oncologic Medical History: Past Medical History Notes: Left Breast. 12/08/21- 01/29/22- 37 rounds of Radiation Psychiatric Medical History: Past Medical History Notes: General Anxiety Immunizations Pneumococcal Vaccine: Received Pneumococcal Vaccination: Yes Received Pneumococcal Vaccination On or After 60th Birthday: No Implantable Devices Yes Hospitalization / Surgery History Type of Hospitalization/Surgery 03/25/22 Left Breast cyst excision; 11/12/21 Left Breast Lumpectomy with Radioactive seeds;Left breat Biopsy;Anterior Family and Social History Never smoker; Marital Status - Married; Alcohol Use: Moderate; Drug Use: No History; Caffeine Use: Daily; Financial Concerns: No; Food, Clothing or Shelter Needs: No; Support System Lacking: No; Transportation Concerns: No Electronic Signature(s) Signed: 08/13/2022 10:55:37 AM By: [name] MD FACS Entered By: [name] on 08/13/2022 09:37:11 -------------------------------------------------------------------------------- SuperBill Details Patient Name: Date of Service: [name] Mattes. 08/13/2022 Medical Record Number: [id number] Patient Account Number: [id number] Date of Birth/Sex: Treating RN: 1970/12/11 (52 y.o. 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 Facility Procedures : CPT4 Code: [id number] Description: [id number] - DEBRIDE WOUND 1ST 20 SQ CM OR < ICD-10 Diagnosis Description S21.002D Unspecified open wound of left breast, subsequent encounter Modifier: Quantity: 1 Physician Procedures : CPT4 Code Description Modifier [id number] - WC PHYS LEVEL 3 - EST PT 25 ICD-10 Diagnosis Description S21.002D Unspecified open wound of left breast, subsequent encounter T81.31XD Disruption of external operation (surgical) wound, not elsewhere  classified, subsequent encounter 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

## 2022-08-13 NOTE — Progress Notes (Signed)
[name], [name] ([id number]) [phone].pdf Page 1 of 2 Visit Report for 08/12/2022 Problem List Details Patient Name: Date of Service: [name], [name] 08/12/2022 10:00 A M Medical Record Number: [id number] Patient Account Number: [id number] Date of Birth/Sex: Treating RN: 23-Mar-1971 (52 y.o. 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 08/12/2022 Medical Record Number: [id number] Patient Account Number: [id number] Date of Birth/Sex: Treating RN: 1971-03-23 (52 y.o. 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 Facility Procedures : CPT4 Code Description: [id number]-(Facility Use Only) HBOT full body chamber, 76mn , ICD-10 Diagnosis Description L59.8 Other specified disorders of the skin and subcutaneous tissue related to rad S21.002D Unspecified open wound of left breast,  subsequent encounter T81.31XD Disruption of external operation (surgical) wound, not elsewhere classified, 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 Modifier: iation subsequent encou Quantity: 4 nter Physician Procedures : CPT4 Code Description Modifier [id number]- WC PHYS HYPERBARIC OXYGEN THERAPY ICD-10 Diagnosis Description 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 S21.002D Unspecified open wound of left breast,  subsequent encounter T81.31XD Disruption of external operation (surgical) wound, not elsewhere classified, subsequent enco 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

## 2022-08-13 NOTE — Progress Notes (Signed)
[name], [name] ([id number]) [id number] Page 1 of 2 Visit Report for 08/12/2022 HBO Details Patient Name: Date of Service: [name], [name] 08/12/2022 10:00 A M Medical Record Number: [id number] Patient Account Number: [id number] Date of Birth/Sex: Treating RN: Aug 16, 1970 (52 y.o. 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 HBO Treatment Details Treatment Number: 10 Patient Type: Outpatient Chamber Type: Monoplace Chamber Serial #: [id number] Treatment Protocol: 2.0 ATA with 90 minutes oxygen, and no air breaks Treatment Details Compression Rate Down: 2.0 psi / minute De-Compression Rate Up: 2.0 psi / minute Air breaks and breathing Decompress Decompress Compress Tx Pressure Begins Reached periods Begins Ends (leave unused spaces blank) Chamber Pressure (ATA 1 2 ------2 1 ) Clock Time (24 hr) 10:15 10:26 - - - - - - 11:56 12:02 Treatment Length: 107 (minutes) Treatment Segments: 4 Vital Signs Capillary Blood Glucose Reference Range: 80 - 120 mg / dl HBO Diabetic Blood Glucose Intervention Range: <131 mg/dl or >249 mg/dl Time Vitals Blood Respiratory Capillary Blood Glucose Pulse Action Type: Pulse: Temperature: Taken: Pressure: Rate: Glucose (mg/dl): Meter #: Oximetry (%) Taken: Pre 09:57 118/73 76 18 98.2 Post 12:06 115/80 75 18 98.2 Treatment Response Treatment Toleration: Well Treatment Completion Status: Treatment Completed without Adverse Event Physician HBO Attestation: I certify that I supervised this HBO treatment in accordance with Medicare guidelines. A trained emergency  response team is readily available per Yes hospital policies and procedures. Continue HBOT as ordered. Yes Electronic Signature(s) Signed: 08/12/2022 5:26:38 PM By: [name] Maudlin MD FACS Previous Signature: 08/12/2022 1:43:19 PM Version By: [name] EMT Entered By: [name] on 08/12/2022 17:26:38 Marcello [name] ([id number]) [id number] Page 2 of 2 -------------------------------------------------------------------------------- HBO Safety Checklist Details Patient Name: Date of Service: [name] [name] 08/12/2022 10:00 A M Medical Record Number: [id number] Patient Account Number: [id number] Date of Birth/Sex: Treating RN: 1970/07/12 (52 y.o. [name], [name] Primary Care [name]: [name] Other Clinician: Valeria [name] Referring [name]: Treating [name]/Extender: [name], [name] in Treatment: 15 HBO Safety Checklist Items Safety Checklist Consent Form Signed Patient voided / foley secured and emptied When did you last eato 0930 Last dose of injectable or oral agent NA Ostomy pouch emptied and vented if applicable NA All implantable devices assessed, documented and approved NA Intravenous access site secured and place NA Valuables secured Linens and cotton and cotton/polyester blend (less than 51% polyester) Personal oil-based products / skin lotions / body lotions removed Wigs or hairpieces removed Smoking or tobacco materials removed Books / newspapers / magazines / loose paper removed Cologne, aftershave, perfume and deodorant removed Jewelry removed (may wrap wedding band) Make-up removed Hair care products removed Battery operated devices (external) removed Heating patches and chemical warmers removed Titanium eyewear removed NA Nail polish cured greater than 10 hours 3 [name] ago Casting material cured greater than 10 hours NA Hearing aids removed NA Loose dentures or partials  removed NA Prosthetics have been removed NA Patient demonstrates correct use of air break device (if applicable) Patient concerns have been addressed Patient grounding bracelet on and cord attached to chamber Specifics for Inpatients (complete in addition to above) Medication sheet sent with patient NA Intravenous medications needed or due during therapy sent with  patient NA Drainage tubes (e.g. nasogastric tube or chest tube secured and vented) NA Endotracheal or Tracheotomy tube secured NA Cuff deflated of air and inflated with saline NA Airway suctioned NA Notes The safety checklist was done before the treatment was started. Electronic Signature(s) Signed: 08/12/2022 1:42:21 PM By: [name] EMT Entered By: [name] on 08/12/2022 13:42:21

## 2022-08-13 NOTE — Progress Notes (Signed)
[name], [name] ([id number]) [phone].pdf Page 1 of 6 Visit Report for 08/13/2022 Arrival Information Details Patient Name: Date of Service: [name] 08/13/2022 9:15 A M Medical Record Number: [id number] Patient Account Number: [id number] Date of Birth/Sex: Treating RN: Nov 09, 1970 (52 y.o. F) Primary Care Payten Hobin: [name] Other Clinician: Referring Kellin Fifer: Treating Jabree Rebert/Extender: [name], [name] Weeks in Treatment: 15 Visit Information History Since Last Visit All ordered tests and consults were completed: No Patient Arrived: Ambulatory Added or deleted any medications: No Arrival Time: 09:15 Any new allergies or adverse reactions: No Accompanied By: self Had a fall or experienced change in No Transfer Assistance: None activities of daily living that may affect Patient Identification Verified: Yes risk of falls: Secondary Verification Process Completed: Yes Signs or symptoms of abuse/neglect since last visito No Patient Requires Transmission-Based Precautions: No Hospitalized since last visit: No Patient Has Alerts: No Implantable device outside of the clinic excluding No cellular tissue based products placed in the center since last visit: Pain Present Now: No Electronic Signature(s) Signed: 08/13/2022 11:27:16 AM By: Worthy Rancher Entered By: Worthy Rancher on 08/13/2022 09:16:22 -------------------------------------------------------------------------------- Encounter Discharge Information Details Patient Name: Date of Service: [name], [name] 08/13/2022 9:15 A M Medical Record Number: [id number] Patient Account Number: [id number] Date of Birth/Sex: Treating RN: Oct 20, 1970 (52 y.o. [name] Primary Care Greer Koeppen: [name] Other Clinician: Referring Dian Laprade: Treating Aleksei Goodlin/Extender: Shea Stakes in Treatment: 15 Encounter Discharge Information Items Post  Procedure Vitals Discharge Condition: Stable Temperature (F): 98.9 Ambulatory Status: Ambulatory Pulse (bpm): 74 Discharge Destination: Home Respiratory Rate (breaths/min): 20 Transportation: Private Auto Blood Pressure (mmHg): 131/83 Accompanied By: self Schedule Follow-up Appointment: Yes Clinical Summary of Care: Patient Declined Electronic Signature(s) Signed: 08/13/2022 3:35:42 PM By: [name] Entered By: [name] on 08/13/2022 09:43:07 [name] ([id number]) [id number] Page 2 of 6 -------------------------------------------------------------------------------- Lower Extremity Assessment Details Patient Name: Date of Service: [name] 08/13/2022 9:15 A M Medical Record Number: [id number] Patient Account Number: [id number] Date of Birth/Sex: Treating RN: 1970-12-30 (52 y.o. [name] Primary Care Kazuma Elena: [name] Other Clinician: Referring Anthonette Lesage: Treating Maisy Newport/Extender: [name], [name] Weeks in Treatment: 15 Electronic Signature(s) Signed: 08/13/2022 3:35:42 PM By: Sabas Sous By: [name] on 08/13/2022 09:28:31 -------------------------------------------------------------------------------- Multi Wound Chart Details Patient Name: Date of Service: [name], [name] 08/13/2022 9:15 A M Medical Record Number: [id number] Patient Account Number: [id number] Date of Birth/Sex: Treating RN: [birth year] (52 y.o. F) Primary Care Davione Lenker: [name] Other Clinician: Referring Nithila Sumners: Treating Maizee Reinhold/Extender: [name], [name] Weeks in Treatment: 15 Vital Signs Height(in): 63 Pulse(bpm): 74 Weight(lbs): 170 Blood Pressure(mmHg): 131/83 Body Mass Index(BMI): 30.1 Temperature(F): 98.9 Respiratory Rate(breaths/min): 20 [1:Photos:] [N/A:N/A] Left Breast N/A N/A Wound Location: Surgical Injury N/A N/A Wounding  Event: Malignant Wound N/A N/A Primary Etiology: Lymphedema N/A N/A Comorbid History: 04/03/2022 N/A N/A Date Acquired: 15 N/A N/A Weeks of Treatment: Open N/A N/A Wound Status: No N/A N/A Wound Recurrence: 1.5x1x0.1 N/A N/A Measurements L x W x D (cm) 1.178 N/A N/A A (cm) : rea 0.118 N/A N/A Volume (cm) : 91.20% N/A N/A % Reduction in A rea: 97.80% N/A N/A % Reduction in Volume: Full Thickness Without Exposed N/A N/A Classification: Support Structures Medium N/A N/A Exudate Amount: Serosanguineous N/A N/A Exudate Type: red, brown N/A N/A Exudate Color: Distinct, outline attached N/A N/A Wound Margin: Large (67-100%) N/A N/A Granulation Amount: Red, Pink N/A N/A Granulation Quality:  Small (1-33%) N/A N/A Necrotic Amount: BARBEE, MAMULA ([id number]) [phone].pdf Page 3 of 6 Fat Layer (Subcutaneous Tissue): Yes N/A N/A Exposed Structures: Fascia: No Tendon: No Muscle: No Joint: No Bone: No Small (1-33%) N/A N/A Epithelialization: Induration: Yes N/A N/A Periwound Skin Texture: No Abnormalities Noted N/A N/A Periwound Skin Moisture: No Abnormalities Noted N/A N/A Periwound Skin Color: No Abnormality N/A N/A Temperature: Treatment Notes Electronic Signature(s) Signed: 08/13/2022 9:35:57 AM By: [name] MD FACS Entered By: [name] on 08/13/2022 09:35:57 -------------------------------------------------------------------------------- Multi-Disciplinary Care Plan Details Patient Name: Date of Service: [name], [place] W. 08/13/2022 9:15 A M Medical Record Number: [id number] Patient Account Number: [id number] Date of Birth/Sex: Treating RN: June 27, 1971 (52 y.o. [name] Primary Care Chemeka Filice: [name] Other Clinician: Referring Issak Goley: Treating Khani Paino/Extender: Shea Stakes in Treatment: [place] reviewed with physician Active  Inactive Wound/Skin Impairment Nursing Diagnoses: Impaired tissue integrity Goals: Patient/caregiver will verbalize understanding of skin care regimen Date Initiated: 04/24/2022 Target Resolution Date: 10/02/2022 Goal Status: Active Interventions: Assess ulceration(s) every visit Treatment Activities: Skin care regimen initiated : 04/24/2022 Notes: Electronic Signature(s) Signed: 08/13/2022 3:35:42 PM By: [name] Entered By: [name] on 08/13/2022 09:42:04 -------------------------------------------------------------------------------- Pain Assessment Details Patient Name: Date of Service: [name] 08/13/2022 9:15 A M Medical Record Number: [id number] Patient Account Number: [id number] Date of Birth/Sex: Treating RN: 1970-12-30 (52 y.o. [name], [name] ([id number]) [id number] Page 4 of 6 Primary Care Ian Castagna: [name] Other Clinician: Referring Arilyn Brierley: Treating Brayli Klingbeil/Extender: [name], [name] Weeks in Treatment: 15 Active Problems Location of Pain Severity and Description of Pain Patient Has Paino No Site Locations Pain Management and Medication Current Pain Management: Electronic Signature(s) Signed: 08/13/2022 11:27:16 AM By: Worthy Rancher Entered By: Worthy Rancher on 08/13/2022 09:16:52 -------------------------------------------------------------------------------- Patient/Caregiver Education Details Patient Name: Date of Service: [name] 2/8/2024andnbsp9:15 A M Medical Record Number: [id number] Patient Account Number: [id number] Date of Birth/Gender: Treating RN: [birth year] (52 y.o. [name] Primary Care Physician: [name] Other Clinician: Referring Physician: Treating Physician/Extender: Shea Stakes in Treatment: 15 Education Assessment Education Provided To: Patient Education Topics Provided Wound/Skin  Impairment: Methods: Explain/Verbal Responses: Reinforcements needed, State content correctly Electronic Signature(s) Signed: 08/13/2022 3:35:42 PM By: [name] Entered By: [name] on 08/13/2022 09:42:36 [name] ([id number]) [id number] Page 5 of 6 -------------------------------------------------------------------------------- Wound Assessment Details Patient Name: Date of Service: [name] 08/13/2022 9:15 A M Medical Record Number: [id number] Patient Account Number: [id number] Date of Birth/Sex: Treating RN: [birth year] (52 y.o. F) Primary Care Dayson Aboud: [name] Other Clinician: Referring Jemari Hallum: Treating Everline Mahaffy/Extender: [name], [name] Weeks in Treatment: 15 Wound Status Wound Number: 1 Primary Etiology: Malignant Wound Wound Location: Left Breast Wound Status: Open Wounding Event: Surgical Injury Comorbid History: Lymphedema Date Acquired: 04/03/2022 Weeks Of Treatment: 15 Clustered Wound: No Photos Wound Measurements Length: (cm) 1 Width: (cm) 1 Depth: (cm) 0 Area: (cm) Volume: (cm) .5 % Reduction in Area: 91.2% % Reduction in Volume: 97.8% .1 Epithelialization: Small (1-33%) 1.178 Tunneling: No 0.118 Undermining: No Wound Description Classification: Full Thickness Without Exposed Suppor Wound Margin: Distinct, outline attached Exudate Amount: Medium Exudate Type: Serosanguineous Exudate Color: red, brown t Structures Foul Odor After Cleansing: No Slough/Fibrino Yes Wound Bed Granulation Amount: Large (67-100%) Exposed Structure Granulation Quality: Red, Pink Fascia Exposed: No Necrotic Amount: Small (1-33%) Fat Layer (Subcutaneous Tissue) Exposed: Yes Necrotic Quality: Adherent Slough Tendon Exposed: No Muscle  Exposed: No Joint Exposed: No Bone Exposed: No Periwound Skin Texture Texture Color No Abnormalities Noted: No No Abnormalities Noted: Yes Induration:  Yes Temperature / Pain Temperature: No Abnormality Moisture No Abnormalities Noted: Yes Treatment Notes Wound #1 (Breast) Wound Laterality: Left Cleanser Normal Saline IVY, PURYEAR ([id number]) [phone].pdf Page 6 of 6 Discharge Instruction: Cleanse the wound with Normal Saline prior to applying a clean dressing using gauze sponges, not tissue or cotton balls. Soap and Water Discharge Instruction: May shower and wash wound with dial antibacterial soap and water prior to dressing change. [place] Ancillary Kit - 15 Day Supply Discharge Instruction: Use supplies as instructed; Kit contains: (15) Saline Bullets; (15) 3x3 Gauze; 15 pr Gloves Peri-Wound Care Topical Primary Dressing Promogran Prisma Matrix, 4.34 (sq in) (silver collagen) Discharge Instruction: Moisten collagen with saline or hydrogel Secondary Dressing Zetuvit Plus Silicone Border Dressing 5x5 (in/in) Discharge Instruction: Apply silicone border over primary dressing as directed. Secured With Compression Wrap Compression Stockings Environmental education officer) Signed: 08/13/2022 3:35:42 PM By: [name] Entered By: [name] on 08/13/2022 09:29:58 -------------------------------------------------------------------------------- Vitals Details Patient Name: Date of Service: [name], [place] W. 08/13/2022 9:15 A M Medical Record Number: [id number] Patient Account Number: [id number] Date of Birth/Sex: Treating RN: 1971-05-06 (52 y.o. F) Primary Care Kayton Ripp: [name] Other Clinician: Referring Sharonica Kraszewski: Treating Adali Pennings/Extender: [name], [name] Weeks in Treatment: 15 Vital Signs Time Taken: 09:15 Temperature (F): 98.9 Height (in): 63 Pulse (bpm): 74 Weight (lbs): 170 Respiratory Rate (breaths/min): 20 Body Mass Index (BMI): 30.1 Blood Pressure (mmHg): 131/83 Reference Range: 80 - 120 mg / dl Electronic Signature(s) Signed: 08/13/2022  11:27:16 AM By: Worthy Rancher Entered By: Worthy Rancher on 08/13/2022 09:16:47

## 2022-08-14 ENCOUNTER — Encounter (HOSPITAL_BASED_OUTPATIENT_CLINIC_OR_DEPARTMENT_OTHER): Payer: 59 | Admitting: General Surgery

## 2022-08-14 DIAGNOSIS — T8131XD Disruption of external operation (surgical) wound, not elsewhere classified, subsequent encounter: Secondary | ICD-10-CM | POA: Diagnosis not present

## 2022-08-14 NOTE — Progress Notes (Signed)
[name], [name] ([id number]) [id number] Page 1 of 2 Visit Report for 08/14/2022 HBO Details Patient Name: Date of Service: [name], [name] 08/14/2022 10:00 A M Medical Record Number: [id number] Patient Account Number: [id number] Date of Birth/Sex: Treating RN: 1971/04/18 (52 y.o. 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 HBO Treatment Details Treatment Number: 12 Patient Type: Outpatient Chamber Type: Monoplace Chamber Serial #: [id number] Treatment Protocol: 2.0 ATA with 90 minutes oxygen, and no air breaks Treatment Details Compression Rate Down: 1.5 psi / minute De-Compression Rate Up: 2.0 psi / minute Air breaks and breathing Decompress Decompress Compress Tx Pressure Begins Reached periods Begins Ends (leave unused spaces blank) Chamber Pressure (ATA 1 2 ------2 1 ) Clock Time (24 hr) 10:04 10:14 - - - - - - 11:44 11:51 Treatment Length: 107 (minutes) Treatment Segments: 4 Vital Signs Capillary Blood Glucose Reference Range: 80 - 120 mg / dl HBO Diabetic Blood Glucose Intervention Range: <131 mg/dl or >249 mg/dl Type: Time Vitals Blood Respiratory Capillary Blood Glucose Pulse Action Pulse: Temperature: Taken: Pressure: Rate: Glucose (mg/dl): Meter #: Oximetry (%) Taken: Pre 10:01 125/75 78 16 97.8 none per protocol Post 11:53 107/70 77 18 97.3 none per protocol Treatment Response Treatment Toleration: Well Treatment Completion Status: Treatment Completed without Adverse Event Treatment Notes Patient arrived and prepared for treatment. After performing safety check,  patient was placed in the chamber which was compressed at a rate of 1 psi/min until reaching 3 psig at which time patient confirmed normal ear equalization and rate set was increased to 2 psi/min until reaching treatment depth of 2 ATA. Patient tolerated treatment and decompression of the chamber at 2 psi/min. Patient denied any symptoms of barotrauma and was stable upon discharge. Physician HBO Attestation: I certify that I supervised this HBO treatment in accordance with Medicare guidelines. A trained emergency response team is readily available per Yes hospital policies and procedures. Continue HBOT as ordered. Yes Electronic Signature(s) Signed: 08/14/2022 1:36:22 PM By: [name] MD FACS Previous Signature: 08/14/2022 12:55:21 PM Version By: Donavan Burnet CHT EMT BS , , Entered By: [name] on 08/14/2022 13:36:22 [name] ([id number].pdf Page 2 of 2 -------------------------------------------------------------------------------- HBO Safety Checklist Details Patient Name: Date of Service: [name] [name] 08/14/2022 10:00 A M Medical Record Number: [id number] Patient Account Number: [id number] Date of Birth/Sex: Treating RN: 04-13-1971 (52 y.o. [name] Primary Care Khris Jansson: [name] Other Clinician: Donavan Burnet Referring Owen Pagnotta: Treating Latorria Zeoli/Extender: Shea Stakes in Treatment: 16 HBO Safety Checklist Items Safety Checklist Consent Form Signed Patient voided / foley secured and emptied When did you last eato 0730 Last dose of injectable or oral agent N/A Ostomy pouch emptied and vented if applicable NA All implantable devices assessed, documented and approved NA Intravenous access site secured and place NA Valuables secured Linens and cotton and cotton/polyester blend (less than 51% polyester) Personal oil-based products / skin lotions / body lotions  removed Wigs or hairpieces removed NA Smoking or tobacco materials removed NA Books / newspapers / magazines / loose paper removed Cologne, aftershave, perfume and deodorant removed Jewelry removed (may wrap wedding band) Make-up removed Hair care products removed Battery operated devices (external) removed Heating patches  and chemical warmers removed Titanium eyewear removed Nail polish cured greater than 10 hours Nails done yesterday Casting material cured greater than 10 hours NA Hearing aids removed NA Loose dentures or partials removed NA Prosthetics have been removed NA Patient demonstrates correct use of air break device (if applicable) Patient concerns have been addressed Patient grounding bracelet on and cord attached to chamber Specifics for Inpatients (complete in addition to above) Medication sheet sent with patient NA Intravenous medications needed or due during therapy sent with patient NA Drainage tubes (e.g. nasogastric tube or chest tube secured and vented) NA Endotracheal or Tracheotomy tube secured NA Cuff deflated of air and inflated with saline NA Airway suctioned NA Notes Paper version used prior to treatment start. Electronic Signature(s) Signed: 08/14/2022 12:51:46 PM By: Donavan Burnet CHT EMT BS , , Entered By: Donavan Burnet on 08/14/2022 12:51:46

## 2022-08-14 NOTE — Progress Notes (Signed)
DESMARIE, NANDIN ([id number]) [id number] Page 1 of 1 Visit Report for 08/14/2022 SuperBill Details Patient Name: Date of Service: [name] 08/14/2022 Medical Record Number: [id number] Patient Account Number: [id number] Date of Birth/Sex: Treating RN: [birth year] (52 y.o. 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 Facility Procedures CPT4 Code Description Modifier Quantity [id number]-(Facility Use Only) HBOT full body chamber, 64mn , 4 ICD-10 Diagnosis Description 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 S21.002D Unspecified open wound of left breast, subsequent encounter T81.31XD Disruption of external operation (surgical) wound, not elsewhere classified, subsequent encounter 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 Physician Procedures Quantity CPT4 Code Description Modifier [id number] PHYS HYPERBARIC OXYGEN THERAPY 1 ICD-10 Diagnosis Description 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

## 2022-08-14 NOTE — Progress Notes (Signed)
[name] ([id number]) [phone].pdf Page 1 of 1 Visit Report for 08/14/2022 Arrival Information Details Patient Name: Date of Service: [name] 08/14/2022 10:00 A M Medical Record Number: [id number] Patient Account Number: [id number] Date of Birth/Sex: Treating RN: Dec 13, 1970 (52 y.o. [name] Primary Care Josclyn Rosales: [name] Other Clinician: Donavan Burnet Referring Aveya Beal: Treating Jacqueline Spofford/Extender: Shea Stakes in Treatment: 41 Visit Information History Since Last Visit All ordered tests and consults were completed: Yes Patient Arrived: Ambulatory Added or deleted any medications: No Arrival Time: 09:52 Any new allergies or adverse reactions: No Accompanied By: self Had a fall or experienced change in No Transfer Assistance: None activities of daily living that may affect Patient Identification Verified: Yes risk of falls: Secondary Verification Process Completed: Yes Signs or symptoms of abuse/neglect since last visito No Patient Requires Transmission-Based Precautions: No Hospitalized since last visit: No Patient Has Alerts: No Implantable device outside of the clinic excluding No cellular tissue based products placed in the center since last visit: Pain Present Now: No Electronic Signature(s) Signed: 08/14/2022 12:50:18 PM By: Donavan Burnet CHT EMT BS , , Entered By: Donavan Burnet on 08/14/2022 12:50:18 -------------------------------------------------------------------------------- Vitals Details Patient Name: Date of Service: [name], [name] 08/14/2022 10:00 A M Medical Record Number: [id number] Patient Account Number: [id number] Date of Birth/Sex: Treating RN: [name] (52 y.o. [name] Primary Care Shankar Silber: [name] Other Clinician: Donavan Burnet Referring Jubilee Vivero: Treating Symia Herdt/Extender: [name], [name] Weeks in  Treatment: 16 Vital Signs Time Taken: 10:01 Temperature (F): 97.8 Height (in): 63 Pulse (bpm): 78 Weight (lbs): 170 Respiratory Rate (breaths/min): 16 Body Mass Index (BMI): 30.1 Blood Pressure (mmHg): 125/75 Reference Range: 80 - 120 mg / dl Electronic Signature(s) Signed: 08/14/2022 12:50:35 PM By: Donavan Burnet CHT EMT BS , , Entered By: Donavan Burnet on 08/14/2022 12:50:35

## 2022-08-15 ENCOUNTER — Other Ambulatory Visit (HOSPITAL_COMMUNITY): Payer: Self-pay

## 2022-08-17 ENCOUNTER — Encounter (HOSPITAL_BASED_OUTPATIENT_CLINIC_OR_DEPARTMENT_OTHER): Payer: 59 | Admitting: General Surgery

## 2022-08-17 ENCOUNTER — Other Ambulatory Visit: Payer: Self-pay

## 2022-08-17 ENCOUNTER — Other Ambulatory Visit (HOSPITAL_COMMUNITY): Payer: Self-pay

## 2022-08-17 DIAGNOSIS — T8131XD Disruption of external operation (surgical) wound, not elsewhere classified, subsequent encounter: Secondary | ICD-10-CM | POA: Diagnosis not present

## 2022-08-17 DIAGNOSIS — L59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: Secondary | ICD-10-CM | POA: Diagnosis not present

## 2022-08-17 MED ORDER — GABAPENTIN 300 MG PO CAPS
300.0000 mg | ORAL_CAPSULE | Freq: Every day | ORAL | 2 refills | Status: DC
  Filled 2022-08-17: qty 30, 30d supply, fill #0
  Filled 2022-09-22: qty 30, 30d supply, fill #1
  Filled 2022-12-22: qty 30, 30d supply, fill #2

## 2022-08-17 NOTE — Progress Notes (Signed)
[name], [name] ([id number]) [phone].pdf Page 1 of 2 Visit Report for 08/17/2022 Problem List Details Patient Name: Date of Service: [name], [name] 08/17/2022 10:00 A M Medical Record Number: [id number] Patient Account Number: [id number] Date of Birth/Sex: Treating RN: 12/11/1970 (52 y.o. 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 08/17/2022 Medical Record Number: [id number] Patient Account Number: [id number] Date of Birth/Sex: Treating RN: 1970-10-22 (53 y.o. 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 Facility Procedures : CPT4 Code Description: [id number] [id number]-(Facility Use Only) HBOT full body chamber, 68mn , ICD-10 Diagnosis Description L59.8 Other specified disorders of the skin and subcutaneous tissue related to rad S21.002D Unspecified open wound of left breast,  subsequent encounter T81.31XD Disruption of external operation (surgical) wound, not elsewhere classified, 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 Modifier: iation subsequent encou Quantity: 4 nter Physician Procedures : CPT4 Code Description Modifier [id number] PHYS HYPERBARIC OXYGEN THERAPY ICD-10 Diagnosis Description 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

## 2022-08-17 NOTE — Progress Notes (Addendum)
[name], [name] ([id number]) [id number] Page 1 of 2 Visit Report for 08/17/2022 HBO Details Patient Name: Date of Service: [name], [name] 08/17/2022 10:00 A M Medical Record Number: [id number] Patient Account Number: [id number] Date of Birth/Sex: Treating RN: 1971/03/21 (52 y.o. 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 HBO Treatment Details Treatment Number: 13 Patient Type: Outpatient Chamber Type: Monoplace Chamber Serial #: [id number] Treatment Protocol: 2.0 ATA with 90 minutes oxygen, and no air breaks Treatment Details Compression Rate Down: 2.0 psi / minute De-Compression Rate Up: 2.0 psi / minute Air breaks and breathing Decompress Decompress Compress Tx Pressure Begins Reached periods Begins Ends (leave unused spaces blank) Chamber Pressure (ATA 1 2 ------2 1 ) Clock Time (24 hr) 10:08 10:15 - - - - - - 11:46 11:54 Treatment Length: 106 (minutes) Treatment Segments: 4 Vital Signs Capillary Blood Glucose Reference Range: 80 - 120 mg / dl HBO Diabetic Blood Glucose Intervention Range: <131 mg/dl or >249 mg/dl Time Vitals Blood Respiratory Capillary Blood Glucose Pulse Action Type: Pulse: Temperature: Taken: Pressure: Rate: Glucose (mg/dl): Meter #: Oximetry (%) Taken: Pre 09:58 132/61 80 16 97.3 Post 12:11 126/61 89 18 97 Treatment Response Treatment Toleration: Well Treatment Completion Status: Treatment Completed without Adverse Event Physician HBO Attestation: I certify that I supervised this HBO treatment in accordance with Medicare guidelines. A trained emergency  response team is readily available per Yes hospital policies and procedures. Continue HBOT as ordered. Yes Electronic Signature(s) Signed: 08/17/2022 1:45:44 PM By: [name] MD FACS Previous Signature: 08/17/2022 1:17:48 PM Version By: Valeria Batman EMT Entered By: [name] on 08/17/2022 13:45:44 [name] ([id number].pdf Page 2 of 2 -------------------------------------------------------------------------------- HBO Safety Checklist Details Patient Name: Date of Service: [name] [name] 08/17/2022 10:00 [place] Record Number: [id number] Patient Account Number: [id number] Date of Birth/Sex: Treating RN: 06-16-1971 (52 y.o. [name], Meta.Reding Primary Care Hezikiah Retzloff: [name] Other Clinician: Valeria Batman Referring Treasa Bradshaw: Treating Arielis Leonhart/Extender: [name], [name] Weeks in Treatment: 16 HBO Safety Checklist Items Safety Checklist Consent Form Signed Patient voided / foley secured and emptied When did you last eato 0930 Last dose of injectable or oral agent NA Ostomy pouch emptied and vented if applicable NA All implantable devices assessed, documented and approved NA Intravenous access site secured and place NA Valuables secured Linens and cotton and cotton/polyester blend (less than 51% polyester) Personal oil-based products / skin lotions / body lotions removed Wigs or hairpieces removed Smoking or tobacco materials removed Books / newspapers / magazines / loose paper removed Cologne, aftershave, perfume and deodorant removed Jewelry removed (may wrap wedding band) Make-up removed Hair care products removed Battery operated devices (external) removed Heating patches and chemical warmers removed Titanium eyewear removed Nail polish cured greater than 10 hours 08/13/2021 Casting material cured greater than 10 hours NA Hearing aids removed NA Loose dentures or partials  removed NA Prosthetics have been removed NA Patient demonstrates correct use of air break device (if applicable) Patient concerns have been addressed Patient grounding bracelet on and cord attached to chamber Specifics for Inpatients (complete in addition to above) Medication sheet sent with patient NA Intravenous medications needed or due during therapy sent with patient NA Drainage  tubes (e.g. nasogastric tube or chest tube secured and vented) NA Endotracheal or Tracheotomy tube secured NA Cuff deflated of air and inflated with saline NA Airway suctioned NA Notes The safety checklist was done before the treatment was started. Electronic Signature(s) Signed: 08/17/2022 1:14:47 PM By: Valeria Batman EMT Entered By: Valeria Batman on 08/17/2022 13:14:47

## 2022-08-17 NOTE — Progress Notes (Signed)
[name] ([id number]) [id number] Page 1 of 2 Visit Report for 08/17/2022 Arrival Information Details Patient Name: Date of Service: [name], [name] 08/17/2022 10:00 [place] Record Number: [id number] Patient Account Number: [id number] Date of Birth/Sex: Treating RN: 1971/05/24 (52 y.o. [name], Meta.Reding Primary Care Lorrinda Ramstad: [name] Other Clinician: Valeria Batman Referring Abdul Beirne: Treating Erikka Follmer/Extender: Shea Stakes in Treatment: 57 Visit Information History Since Last Visit All ordered tests and consults were completed: Yes Patient Arrived: Ambulatory Added or deleted any medications: No Arrival Time: 09:47 Any new allergies or adverse reactions: No Accompanied By: None Had a fall or experienced change in No Transfer Assistance: None activities of daily living that may affect Patient Identification Verified: Yes risk of falls: Secondary Verification Process Completed: Yes Signs or symptoms of abuse/neglect since last visito No Patient Requires Transmission-Based Precautions: No Hospitalized since last visit: No Patient Has Alerts: No Implantable device outside of the clinic excluding No cellular tissue based products placed in the center since last visit: Pain Present Now: No Electronic Signature(s) Signed: 08/17/2022 1:12:35 PM By: Valeria Batman EMT Entered By: Valeria Batman on 08/17/2022 13:12:35 -------------------------------------------------------------------------------- Encounter Discharge Information Details Patient Name: Date of Service: [name], [name] 08/17/2022 10:00 A M Medical Record Number: [id number] Patient Account Number: [id number] Date of Birth/Sex: Treating RN: 10/05/1970 (52 y.o. [name] Primary Care Daiya Tamer: [name] Other Clinician: Valeria Batman Referring Sahid Borba: Treating Milbern Doescher/Extender: Shea Stakes in  Treatment: 59 Encounter Discharge Information Items Discharge Condition: Stable Ambulatory Status: Ambulatory Discharge Destination: Home Transportation: Private Auto Accompanied By: None Schedule Follow-up Appointment: Yes Clinical Summary of Care: Electronic Signature(s) Signed: 08/17/2022 1:19:07 PM By: Valeria Batman EMT Entered By: Valeria Batman on 08/17/2022 13:19:07 [name] ([id number].pdf Page 2 of 2 -------------------------------------------------------------------------------- Vitals Details Patient Name: Date of Service: [name], [name] 08/17/2022 10:00 A M Medical Record Number: [id number] Patient Account Number: [id number] Date of Birth/Sex: Treating RN: 12-06-1970 (52 y.o. [name], Meta.Reding Primary Care Kemani Demarais: [name] Other Clinician: Valeria Batman Referring Tacha Manni: Treating Julian Askin/Extender: [name], [name] Weeks in Treatment: 16 Vital Signs Time Taken: 09:58 Temperature (F): 97.3 Height (in): 63 Pulse (bpm): 80 Weight (lbs): 170 Respiratory Rate (breaths/min): 16 Body Mass Index (BMI): 30.1 Blood Pressure (mmHg): 132/61 Reference Range: 80 - 120 mg / dl Electronic Signature(s) Signed: 08/17/2022 1:13:06 PM By: Valeria Batman EMT Entered By: Valeria Batman on 08/17/2022 13:13:06

## 2022-08-18 ENCOUNTER — Encounter (HOSPITAL_BASED_OUTPATIENT_CLINIC_OR_DEPARTMENT_OTHER): Payer: 59 | Admitting: General Surgery

## 2022-08-18 DIAGNOSIS — T8131XD Disruption of external operation (surgical) wound, not elsewhere classified, subsequent encounter: Secondary | ICD-10-CM | POA: Diagnosis not present

## 2022-08-18 NOTE — Progress Notes (Signed)
ALLIEMAE, DENGLER ([id number]) [phone].pdf Page 1 of 2 Visit Report for 08/18/2022 Problem List Details Patient Name: Date of Service: [name], [name] 08/18/2022 10:00 A M Medical Record Number: [id number] Patient Account Number: [id number] Date of Birth/Sex: Treating RN: 08/29/1970 (52 y.o. 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 08/18/2022 Medical Record Number: [id number] Patient Account Number: [id number] Date of Birth/Sex: Treating RN: [birth year] (52 y.o. 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 Facility Procedures : CPT4 Code Description: [id number] G0277-(Facility Use Only) HBOT full body chamber, 10mn , ICD-10 Diagnosis Description L59.8 Other specified disorders of the skin and subcutaneous tissue related to rad S21.002D Unspecified open wound of left breast,  subsequent encounter T81.31XD Disruption of external operation (surgical) wound, not elsewhere classified, 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 Modifier: iation subsequent encou Quantity: 4 nter Physician Procedures : CPT4 Code Description Modifier [id number] PHYS HYPERBARIC OXYGEN THERAPY ICD-10 Diagnosis Description 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

## 2022-08-18 NOTE — Progress Notes (Signed)
OLEEN, OKOLI ([id number]) [id number] Page 1 of 2 Visit Report for 08/18/2022 Arrival Information Details Patient Name: Date of Service: [name], [name] 08/18/2022 10:00 [place] Record Number: [id number] Patient Account Number: [id number] Date of Birth/Sex: Treating RN: Nov 30, 1970 (52 y.o. [name] Kelie Gainey: [name] Other Clinician: Valeria Batman Referring Danyah Guastella: Treating Cullin Dishman/Extender: Shea Stakes in Treatment: 46 Visit Information History Since Last Visit All ordered tests and consults were completed: Yes Patient Arrived: Ambulatory Added or deleted any medications: No Arrival Time: 09:37 Any new allergies or adverse reactions: No Accompanied By: None Had a fall or experienced change in No Transfer Assistance: None activities of daily living that may affect Patient Identification Verified: Yes risk of falls: Secondary Verification Process Completed: Yes Signs or symptoms of abuse/neglect since last visito No Patient Requires Transmission-Based Precautions: No Hospitalized since last visit: No Patient Has Alerts: No Implantable device outside of the clinic excluding No cellular tissue based products placed in the center since last visit: Pain Present Now: No Electronic Signature(s) Signed: 08/18/2022 2:02:38 PM By: Valeria Batman EMT Entered By: Valeria Batman on 08/18/2022 14:02:37 -------------------------------------------------------------------------------- Encounter Discharge Information Details Patient Name: Date of Service: [name], [name] NIE W. 08/18/2022 10:00 A M Medical Record Number: [id number] Patient Account Number: [id number] Date of Birth/Sex: Treating RN: [birth year] (52 y.o. [name] Sherlene Rickel: [name] Other Clinician: Valeria Batman Referring Conan Mcmanaway: Treating Joye Wesenberg/Extender: Shea Stakes in  Treatment: 72 Encounter Discharge Information Items Discharge Condition: Stable Ambulatory Status: Cane Discharge Destination: Home Transportation: Private Auto Accompanied By: None Schedule Follow-up Appointment: Yes Clinical Summary of Care: Electronic Signature(s) Signed: 08/18/2022 2:07:30 PM By: Valeria Batman EMT Entered By: Valeria Batman on 08/18/2022 14:07:30 [name] ([id number] Page 2 of 2 -------------------------------------------------------------------------------- Vitals Details Patient Name: Date of Service: [name], [name] 08/18/2022 10:00 A M Medical Record Number: [id number] Patient Account Number: [id number] Date of Birth/Sex: Treating RN: January 03, 1971 (52 y.o. [name] Caswell Alvillar: [name] Other Clinician: Valeria Batman Referring Jakylah Bassinger: Treating Abagale Boulos/Extender: [name], [name] Weeks in Treatment: 16 Vital Signs Time Taken: 09:45 Temperature (F): 97.0 Height (in): 63 Pulse (bpm): 83 Weight (lbs): 170 Respiratory Rate (breaths/min): 20 Body Mass Index (BMI): 30.1 Blood Pressure (mmHg): 116/80 Reference Range: 80 - 120 mg / dl Electronic Signature(s) Signed: 08/18/2022 2:03:06 PM By: Valeria Batman EMT Entered By: Valeria Batman on 08/18/2022 14:03:06

## 2022-08-18 NOTE — Progress Notes (Signed)
VALERA, PREZA ([id number]) [id number] Page 1 of 2 Visit Report for 08/18/2022 HBO Details Patient Name: Date of Service: [name], [name] 08/18/2022 10:00 [place] Record Number: [id number] Patient Account Number: [id number] Date of Birth/Sex: Treating RN: Feb 28, 1971 (52 y.o. 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 HBO Treatment Details Treatment Number: 14 Patient Type: Outpatient Chamber Type: Monoplace Chamber Serial #: [id number] Treatment Protocol: 2.0 ATA with 90 minutes oxygen, and no air breaks Treatment Details Compression Rate Down: 2.0 psi / minute De-Compression Rate Up: 2.0 psi / minute Air breaks and breathing Decompress Decompress Compress Tx Pressure Begins Reached periods Begins Ends (leave unused spaces blank) Chamber Pressure (ATA 1 2 ------2 1 ) Clock Time (24 hr) 10:39 10:48 - - - - - - 12:18 12:25 Treatment Length: 106 (minutes) Treatment Segments: 4 Vital Signs Capillary Blood Glucose Reference Range: 80 - 120 mg / dl HBO Diabetic Blood Glucose Intervention Range: <131 mg/dl or >249 mg/dl Time Vitals Blood Respiratory Capillary Blood Glucose Pulse Action Type: Pulse: Temperature: Taken: Pressure: Rate: Glucose (mg/dl): Meter #: Oximetry (%) Taken: Pre 09:45 116/80 83 20 97 Post 12:27 125/64 79 18 97.5 Treatment Response Treatment Toleration: Well Treatment Completion Status: Treatment Completed without Adverse Event Physician HBO Attestation: I certify that I supervised this HBO treatment in accordance with Medicare guidelines. A trained  emergency response team is readily available per Yes hospital policies and procedures. Continue HBOT as ordered. Yes Electronic Signature(s) Signed: 08/18/2022 2:18:35 PM By: [name] MD FACS Previous Signature: 08/18/2022 2:06:24 PM Version By: Valeria Batman EMT Entered By: [name] on 08/18/2022 14:18:34 [name] ([id number] Page 2 of 2 -------------------------------------------------------------------------------- HBO Safety Checklist Details Patient Name: Date of Service: [name] [name] 08/18/2022 10:00 A M Medical Record Number: [id number] Patient Account Number: [id number] Date of Birth/Sex: Treating RN: [birth year] (52 y.o. [name] Primary Care Deana Krock: [name] Other Clinician: Valeria Batman Referring Constant [place]: Treating Braxston Quinter/Extender: [name], [name] Weeks in Treatment: 16 HBO Safety Checklist Items Safety Checklist Consent Form Signed Patient voided / foley secured and emptied When did you last eato 0930 Last dose of injectable or oral agent NA Ostomy pouch emptied and vented if applicable NA All implantable devices assessed, documented and approved NA Intravenous access site secured and place NA Valuables secured Linens and cotton and cotton/polyester blend (less than 51% polyester) Personal oil-based products / skin lotions / body lotions removed Wigs or hairpieces removed Smoking or tobacco materials removed Books / newspapers / magazines / loose paper removed Cologne, aftershave, perfume and deodorant removed Jewelry removed (may wrap wedding band) Make-up removed Hair care products removed Battery operated devices (external) removed Heating patches and chemical warmers removed Titanium eyewear removed NA Nail polish cured greater than 10 hours 08/13/2021 Casting material cured greater than 10 hours NA Hearing aids removed NA Loose dentures or  partials removed NA Prosthetics have been removed NA Patient demonstrates correct use of air break device (if applicable) Patient concerns have been addressed Patient grounding bracelet on and cord attached to chamber Specifics for Inpatients (complete in addition to above) Medication sheet sent with patient NA Intravenous medications needed or due during therapy sent with patient NA  Drainage tubes (e.g. nasogastric tube or chest tube secured and vented) NA Endotracheal or Tracheotomy tube secured NA Cuff deflated of air and inflated with saline NA Airway suctioned NA Notes The safety checklist was done before the treatment was started. Electronic Signature(s) Signed: 08/18/2022 2:05:13 PM By: Valeria Batman EMT Entered By: Valeria Batman on 08/18/2022 14:05:13

## 2022-08-19 ENCOUNTER — Encounter (HOSPITAL_BASED_OUTPATIENT_CLINIC_OR_DEPARTMENT_OTHER): Payer: 59 | Admitting: General Surgery

## 2022-08-19 DIAGNOSIS — L59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: Secondary | ICD-10-CM | POA: Diagnosis not present

## 2022-08-19 DIAGNOSIS — L98492 Non-pressure chronic ulcer of skin of other sites with fat layer exposed: Secondary | ICD-10-CM | POA: Diagnosis not present

## 2022-08-19 DIAGNOSIS — C50912 Malignant neoplasm of unspecified site of left female breast: Secondary | ICD-10-CM | POA: Diagnosis not present

## 2022-08-19 DIAGNOSIS — T8131XD Disruption of external operation (surgical) wound, not elsewhere classified, subsequent encounter: Secondary | ICD-10-CM | POA: Diagnosis not present

## 2022-08-20 ENCOUNTER — Encounter (HOSPITAL_BASED_OUTPATIENT_CLINIC_OR_DEPARTMENT_OTHER): Payer: 59 | Admitting: General Surgery

## 2022-08-20 NOTE — Progress Notes (Signed)
MUSETTE, SIMENTAL ([id number]) [phone].pdf Page 1 of 9 Visit Report for 08/19/2022 Chief Complaint Document Details Patient Name: Date of Service: [name], [name] 08/19/2022 8:45 A M Medical Record Number: [id number] Patient Account Number: [id number] Date of Birth/Sex: Treating RN: 28-Sep-1970 (52 y.o. F) Primary Care Provider: [name] Other Clinician: Referring Provider: Treating Provider/Extender: [name], [name] Weeks in Treatment: 16 Information Obtained from: Patient Chief Complaint 04/24/2022; patient is here for a surgical wound on her left lateral breast Electronic Signature(s) Signed: 08/19/2022 9:40:46 AM By: [name] MD FACS Entered By: [name] on 08/19/2022 09:40:45 -------------------------------------------------------------------------------- Debridement Details Patient Name: Date of Service: [name] [name], [name] 08/19/2022 8:45 A M Medical Record Number: [id number] Patient Account Number: [id number] Date of Birth/Sex: Treating RN: 1971-05-27 (52 y.o. [name] Primary Care Provider: [name] Other Clinician: Referring Provider: Treating Provider/Extender: Shea Stakes in Treatment: 16 Debridement Performed for Assessment: Wound #1 Left Breast Performed By: Physician [name], MD Debridement Type: Debridement Level of Consciousness (Pre-procedure): Awake and Alert Pre-procedure Verification/Time Out Yes - 09:30 Taken: Start Time: 09:31 T Area Debrided (L x W): otal 1.4 (cm) x 0.5 (cm) = 0.7 (cm) Tissue and other material debrided: Non-Viable, Eschar, Slough, Slough Level: Non-Viable Tissue Debridement Description: Selective/Open Wound Instrument: Curette Bleeding: Minimum Hemostasis Achieved: Pressure Procedural Pain: 0 Post Procedural Pain: 0 Response to Treatment: Procedure was tolerated well Level of Consciousness (Post- Awake and  Alert procedure): Post Debridement Measurements of Total Wound Length: (cm) 1.4 Width: (cm) 0.8 Depth: (cm) 0.1 Volume: (cm) 0.088 Character of Wound/Ulcer Post Debridement: Improved Post Procedure Diagnosis Same as Pre-procedure [name] ([id number].pdf Page 2 of 9 Notes : scribed for Dr. Celine Ahr by Baruch Gouty, RN Electronic Signature(s) Signed: 08/19/2022 10:47:57 AM By: [name] MD FACS Signed: 08/19/2022 4:48:58 PM By: Baruch Gouty RN, BSN Entered By: Baruch Gouty on 08/19/2022 09:34:40 -------------------------------------------------------------------------------- HPI Details Patient Name: Date of Service: [name] [name], [name] 08/19/2022 8:45 A M Medical Record Number: [id number] Patient Account Number: [id number] Date of Birth/Sex: Treating RN: October 26, 1970 (52 y.o. F) Primary Care Provider: [name] Other Clinician: Referring Provider: Treating Provider/Extender: [name], [name] Weeks in Treatment: 16 History of Present Illness HPI Description: ADMISSION 05/25/2022 This is a 52 year old woman who is found to have an abnormal mammogram of her left breast earlier this year showing a 13 mm group of calcifications in the upper quadrant of the left breast. 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 She underwent a left lumpectomy on 11/12/2021. The patient states the wound never really healed and was open. She underwent radiation therapy from 12/10/2021 through 01/19/22 28 fractions of 1.8GY. Essentially the wound would not heal. She was treated several times with antibiotics finally on 03/25/2022 she was taken to the OR by Dr. Barry Dienes for excision of the wound in a sinus. Operative cultures grew strep and Prevotella and a culture from the clinic Livedo MRSA. It was recommended for 4 weeks of Augmentin and doxycycline by  infectious disease. She was seen by wound care and she has been treating the wound with Medihoney ever since. 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 She was on meth  methotrexate 20 mg once a week however this I think was put on hold because of the wound followed by Dr. Posey Pronto of rheumatology. She has asthma. Most recent breast ultrasound was on 9/7 She has a large nonhealing wound on the lateral aspect of her left breast 04/30/2022: Although I do not have a photograph to compare to last week, the RN report is that it is substantially cleaner. She still has ample slough and nonviable fat and subcutaneous tissue present. She only was able to get her Santyl on Monday so she has just had a couple of days of treatment. 05/08/2022: The wound dimensions are about the same, but the surface is substantially cleaner. There is just a little bit of slough accumulation on the surface. 05/18/2022: The wound is a little bit smaller, but not much. It is quite a bit cleaner, however. 05/25/2022: The wound surface continues to improve. Very little slough accumulation this week. No substantial change in the wound dimensions. 06/01/2022: The wound surface continues to have a layer of slough on it. There is a crack in the surface near the 12 o'clock position that when further explored, revealed a cavity and tunnel that is about 2 cm deep. No purulent drainage or concern for infection. 12/11; left breast wound. The wound itself is somewhat smaller in size per our measurements. However she has a divot at 1:00 that measures about a 1.5 cm deep. We have been using iodoform packing and if it and Santyl to the rest of the wound This wound was initially had a left lumpectomy on  11/12/2021 she underwent 28 fractions of radiation. She potentially could benefit from hyperbaric oxygen 12/18; left breast wound. Better looking wound surface and improvement in measurements of the small tunnel. We have been using Santyl and iodoform. 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 Underneath the layer of slough, the tissue is much healthier-looking, with better color and some granulation tissue beginning to form. 07/13/2022: The wound continues to fill with better looking granulation tissue. There is very little slough accumulation and the cavity is shallower. We are going to submit for insurance approval for hyperbaric oxygen therapy, as it has now been 6 months since the time of her initial wound and radiation therapy. 07/20/2022: The cavity continues to fill in and the surface of the wound is improving. EKG and chest x-ray are complete and we are just awaiting insurance approval for hyperbarics. 07/27/2022: The cavity has filled in considerably and the surface of the wound is very clean with just a thin layer of light slough. We are still awaiting insurance approval for hyperbaric oxygen therapy. 08/03/2022: She was approved for hyperbaric oxygen therapy and received 2 treatments last week. It is rather remarkable the improvement already in her wound. The cavity continues to fill and there is epithelialization occurring around the edges of her wound. Minimal slough accumulation. 08/13/2022: Her wound is smaller and is epithelializing. There is no longer a tunnel or tract to pack. There is just slight slough accumulation on the surface. 08/19/2022: Her wound continues to contract and epithelialize. Light slough and eschar on the remaining open areas. LILLIAUNA, MCALPIN ([id number])  [phone].pdf Page 3 of 9 Electronic Signature(s) Signed: 08/19/2022 9:41:12 AM By: [name] MD FACS Entered By: [name] on 08/19/2022 09:41:12 --------------------------------------------------------------------------------  Physical Exam Details Patient Name: Date of Service: [name] 08/19/2022 8:45 A M Medical Record Number: [id number] Patient Account Number: [id number] Date of Birth/Sex: Treating RN: 1971/04/15 (52 y.o. F) Primary Care Provider: [name] Other Clinician: Referring Provider: Treating Provider/Extender: [name], [name] Weeks in Treatment: 16 Constitutional . . . . no acute distress. Respiratory Normal work of breathing on room air. Notes 08/19/2022: Her wound continues to contract and epithelialize. Light slough and eschar on the remaining open areas. Electronic Signature(s) Signed: 08/19/2022 9:41:38 AM By: [name] MD FACS Entered By: [name] on 08/19/2022 09:41:38 -------------------------------------------------------------------------------- Physician Orders Details Patient Name: Date of Service: [name] [name], [name] 08/19/2022 8:45 A M Medical Record Number: [id number] Patient Account Number: [id number] Date of Birth/Sex: Treating RN: 1971/02/18 (52 y.o. [name] Primary Care Provider: [name] Other Clinician: Referring Provider: Treating Provider/Extender: Shea Stakes in Treatment: 22 Verbal / Phone Orders: No Diagnosis Coding ICD-10 Coding Code Description S21.002D Unspecified open wound of left breast, subsequent encounter T81.31XD Disruption of external operation (surgical) wound, not elsewhere classified, subsequent encounter 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 Follow-up Appointments ppointment in 1 week. - Dr.  Celine Ahr Room 2 Return A Wednesday 2/21 @ 09:15 am Anesthetic (In clinic) Topical Lidocaine 4% applied to wound bed Bathing/ Shower/ Hygiene Other Bathing/Shower/Hygiene Orders/Instructions: - Change dressing after bathing Edema Control - Lymphedema / SCD / Other Other Edema Control Orders/Instructions: - Keep doing the [place] checks VONNIE, MCWHINNEY ([id number]) [phone].pdf Page 4 of 9 Hyperbaric Oxygen Therapy Evaluate for HBO Therapy Indication: - soft tissue radionecrosis If appropriate for treatment, begin HBOT per protocol: 2.0 ATA for 90 Minutes without A Breaks ir Total Number of Treatments: - 40 One treatments per day (delivered Monday through Friday unless otherwise specified in Special Instructions below): A frin (Oxymetazoline HCL) 0.05% nasal spray - 1 spray in both nostrils daily as needed prior to HBO treatment for difficulty clearing ears Wound Treatment Wound #1 - Breast Wound Laterality: Left Cleanser: Normal Saline (Generic) 1 x Per Day/30 Days Discharge Instructions: Cleanse the wound with Normal Saline prior to applying a clean dressing using gauze sponges, not tissue or cotton balls. Cleanser: Soap and Water 1 x Per Day/30 Days Discharge Instructions: May shower and wash wound with dial antibacterial soap and water prior to dressing change. Cleanser: Byram Ancillary Kit - 15 Day Supply (Generic) 1 x Per Day/30 Days Discharge Instructions: Use supplies as instructed; Kit contains: (15) Saline Bullets; (15) 3x3 Gauze; 15 pr Gloves Prim Dressing: Promogran Prisma Matrix, 4.34 (sq in) (silver collagen) (Dispense As Written) 1 x Per Day/30 Days ary Discharge Instructions: Moisten collagen with saline or hydrogel Secondary Dressing: Zetuvit Plus Silicone Border Dressing 5x5 (in/in) (Dispense As Written) 1 x Per Day/30 Days Discharge Instructions: Apply silicone border over primary dressing as directed. Electronic Signature(s) Signed:  08/19/2022 10:47:57 AM By: [name] MD FACS Entered By: [name] on 08/19/2022 09:41:46 -------------------------------------------------------------------------------- Problem List Details Patient Name: Date of Service: [name] [name], [name] NIE W. 08/19/2022 8:45 A M Medical Record Number: [id number] Patient Account Number: [id number] Date of Birth/Sex: Treating RN: 01-13-1971 (52 y.o. [name] Primary Care Provider: [name] Other Clinician: Referring Provider: Treating Provider/Extender: Shea Stakes in Treatment: 9 Active Problems ICD-10 Encounter Code Description Active Date MDM Diagnosis S21.002D Unspecified open wound of left breast, subsequent encounter  04/24/2022 No Yes T81.31XD Disruption of external operation (surgical) wound, not elsewhere classified, 04/24/2022 No Yes subsequent encounter L59.8 Other specified disorders of the skin and subcutaneous tissue related to 04/24/2022 No Yes radiation 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 04/24/2022 No Yes Inactive Problems Resolved Problems RAYLYN, HILDENBRANDT ([id number]) [phone].pdf Page 5 of 9 Electronic Signature(s) Signed: 08/19/2022 9:40:34 AM By: [name] MD FACS Entered By: [name] on 08/19/2022 09:40:34 -------------------------------------------------------------------------------- Progress Note Details Patient Name: Date of Service: [name] [name], [place] W. 08/19/2022 8:45 A M Medical Record Number: [id number] Patient Account Number: [id number] Date of Birth/Sex: Treating RN: 19-Jun-1971 (52 y.o. F) Primary Care Provider: [name] Other Clinician: Referring Provider: Treating Provider/Extender: [name], [name] Weeks in Treatment: 16 Subjective Chief Complaint Information obtained from Patient 04/24/2022; patient is here for a surgical wound on her left lateral breast History of Present  Illness (HPI) ADMISSION 05/25/2022 This is a 52 year old woman who is found to have an abnormal mammogram of her left breast earlier this year showing a 13 mm group of calcifications in the upper quadrant of the left breast. 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 She underwent a left lumpectomy on 11/12/2021. The patient states the wound never really healed and was open. She underwent radiation therapy from 12/10/2021 through 01/19/22 28 fractions of 1.8GY. Essentially the wound would not heal. She was treated several times with antibiotics finally on 03/25/2022 she was taken to the OR by Dr. Barry Dienes for excision of the wound in a sinus. Operative cultures grew strep and Prevotella and a culture from the clinic [place] MRSA. It was recommended for 4 weeks of Augmentin and doxycycline by infectious disease. She was seen by wound care and she has been treating the wound with Medihoney ever since. 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 She was on meth methotrexate 20 mg once a week however this I think was put on hold because of the wound followed by Dr. Posey Pronto of rheumatology. She has asthma. Most recent breast ultrasound was on 9/7 She has a large nonhealing wound on the lateral aspect of her left breast 04/30/2022: Although I do not have a photograph to compare to last week, the RN report is that it is substantially cleaner. She still has ample slough and nonviable fat and subcutaneous tissue present. She only was able to get her Santyl on Monday so she has just had a couple of days of treatment. 05/08/2022: The wound dimensions are about the same, but the surface is substantially  cleaner. There is just a little bit of slough accumulation on the surface. 05/18/2022: The wound is a little bit smaller, but not much. It is quite a bit cleaner, however. 05/25/2022: The wound surface continues to improve. Very little slough accumulation this week. No substantial change in the wound dimensions. 06/01/2022: The wound surface continues to have a layer of slough on it. There is a crack in the surface near the 12 o'clock position that when further explored, revealed a cavity and tunnel that is about 2 cm deep. No purulent drainage or concern for infection. 12/11; left breast wound. The wound itself is somewhat smaller in size per our measurements. However she has a  divot at 1:00 that measures about a 1.5 cm deep. We have been using iodoform packing and if it and Santyl to the rest of the wound This wound was initially had a left lumpectomy on 11/12/2021 she underwent 28 fractions of radiation. She potentially could benefit from hyperbaric oxygen 12/18; left breast wound. Better looking wound surface and improvement in measurements of the small tunnel. We have been using Santyl and iodoform. 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 Underneath the layer of slough, the tissue is much healthier-looking, with better color and some granulation tissue beginning to form. 07/13/2022: The wound continues to fill with better looking granulation tissue. There is very little slough accumulation and the cavity is shallower. We are going to submit for insurance approval for hyperbaric oxygen therapy, as it has now been 6 months since the time of her initial wound and radiation therapy. 07/20/2022: The cavity continues to fill in and the surface of the wound is improving. EKG and chest x-ray are  complete and we are just awaiting insurance approval for hyperbarics. 07/27/2022: The cavity has filled in considerably and the surface of the wound is very clean with just a thin layer of light slough. We are still awaiting insurance approval for hyperbaric oxygen therapy. 08/03/2022: She was approved for hyperbaric oxygen therapy and received 2 treatments last week. It is rather remarkable the improvement already in her wound. The cavity continues to fill and there is epithelialization occurring around the edges of her wound. Minimal slough accumulation. 08/13/2022: Her wound is smaller and is epithelializing. There is no longer a tunnel or tract to pack. There is just slight slough accumulation on the surface. 08/19/2022: Her wound continues to contract and epithelialize. Light slough and eschar on the remaining open areas. [name], [name] ([id number]) [phone].pdf Page 6 of 9 Patient History Information obtained from Patient. Social History Never smoker, Marital Status - Married, Alcohol Use - Moderate, Drug Use - No History, Caffeine Use - Daily. Medical History Hematologic/Lymphatic Patient has history of Lymphedema - Left breast-gets Lymphadema tx Hospitalization/Surgery History - 03/25/22 Left Breast cyst excision; 11/12/21 Left Breast Lumpectomy with Radioactive seeds;Left breat Biopsy;Anterior. 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 12/08/21- 01/29/22- 37 rounds of Radiation Psychiatric General Anxiety Objective Constitutional no acute distress. Vitals Time Taken: 8:51 AM, Height: 63 in, Weight: 170 lbs, BMI: 30.1, Temperature: 98.6 F, Pulse: 87 bpm, Respiratory Rate: 20 breaths/min, Blood Pressure: 121/75 mmHg. Respiratory Normal work of breathing on room air. General  Notes: 08/19/2022: Her wound continues to contract and epithelialize. Light slough and eschar on the remaining open areas. Integumentary (Hair, Skin) Wound #1 status is Open. Original cause of wound was Surgical Injury. The date acquired was: 04/03/2022. The wound has been in treatment 16 weeks. The wound is located on the Left Breast. The wound measures 1.4cm length x 0.8cm width x 0.1cm depth; 0.88cm^2 area and 0.088cm^3 volume. There is Fat Layer (Subcutaneous Tissue) exposed. There is a medium amount of serosanguineous drainage noted. The wound margin is distinct with the outline attached to the wound base. There is large (67-100%) red, pink granulation within the wound bed. There is a small (1-33%) amount of necrotic  tissue within the wound bed. The periwound skin appearance had no abnormalities noted for moisture. The periwound skin appearance had no abnormalities noted for color. The periwound skin appearance exhibited: Induration. Periwound temperature was noted as No Abnormality. Assessment Active Problems ICD-10 Unspecified open wound of left breast, subsequent encounter Disruption of external operation (surgical) wound, not elsewhere classified, subsequent encounter Other specified disorders of the skin and subcutaneous tissue related to radiation Intraductal carcinoma in situ of left breast Procedures Wound #1 Pre-procedure diagnosis of Wound #1 is a Malignant Wound located on the Left Breast . There was a Selective/Open Wound Non-Viable Tissue Debridement with a total area of 0.7 sq cm performed by [name], MD. With the following instrument(s): Curette to remove Non-Viable tissue/material. Material removed includes Eschar and Slough and. No specimens were taken. A time out was conducted at 09:30, prior to the start of the procedure. A Minimum amount of bleeding was controlled with Pressure. The procedure was tolerated well with a pain level of 0 throughout and a pain level of  0 following the procedure. Post Debridement Measurements: 1.4cm length x 0.8cm width x 0.1cm depth; 0.088cm^3 volume. Character of Wound/Ulcer Post Debridement is improved. [name], [name] ([id number]) [phone].pdf Page 7 of 9 Post procedure Diagnosis Wound #1: Same as Pre-Procedure General Notes: : scribed for Dr. Celine Ahr by Baruch Gouty, RN. Plan Follow-up Appointments: Return Appointment in 1 week. - Dr. Celine Ahr Room 2 Wednesday 2/21 @ 09:15 am Anesthetic: (In clinic) Topical Lidocaine 4% applied to wound bed Bathing/ Shower/ Hygiene: Other Bathing/Shower/Hygiene Orders/Instructions: - Change dressing after bathing Edema Control - Lymphedema / SCD / Other: Other Edema Control Orders/Instructions: - Keep doing the [place] checks Hyperbaric Oxygen Therapy: Evaluate for HBO Therapy Indication: - soft tissue radionecrosis If appropriate for treatment, begin HBOT per protocol: 2.0 ATA for 90 Minutes without Air Breaks T Number of Treatments: - 40 otal One treatments per day (delivered Monday through Friday unless otherwise specified in Special Instructions below): Afrin (Oxymetazoline HCL) 0.05% nasal spray - 1 spray in both nostrils daily as needed prior to HBO treatment for difficulty clearing ears WOUND #1: - Breast Wound Laterality: Left Cleanser: Normal Saline (Generic) 1 x Per Day/30 Days Discharge Instructions: Cleanse the wound with Normal Saline prior to applying a clean dressing using gauze sponges, not tissue or cotton balls. Cleanser: Soap and Water 1 x Per Day/30 Days Discharge Instructions: May shower and wash wound with dial antibacterial soap and water prior to dressing change. Cleanser: Byram Ancillary Kit - 15 Day Supply (Generic) 1 x Per Day/30 Days Discharge Instructions: Use supplies as instructed; Kit contains: (15) Saline Bullets; (15) 3x3 Gauze; 15 pr Gloves Prim Dressing: Promogran Prisma Matrix, 4.34 (sq in) (silver collagen)  (Dispense As Written) 1 x Per Day/30 Days ary Discharge Instructions: Moisten collagen with saline or hydrogel Secondary Dressing: Zetuvit Plus Silicone Border Dressing 5x5 (in/in) (Dispense As Written) 1 x Per Day/30 Days Discharge Instructions: Apply silicone border over primary dressing as directed. 08/19/2022: Her wound continues to contract and epithelialize. Light slough and eschar on the remaining open areas. I used a curette to debride slough and eschar from the wound. We will continue to use Prisma silver collagen. She is demonstrating a remarkably robust response to hyperbaric oxygen therapy. We will continue this. Follow-up in 1 week. Electronic Signature(s) Signed: 08/19/2022 9:42:17 AM By: [name] MD FACS Entered By: [name] on 08/19/2022 09:42:17 -------------------------------------------------------------------------------- HxROS Details Patient Name: Date of Service: [name] [name], [place] W. 08/19/2022  8:45 A M Medical Record Number: [id number] Patient Account Number: [id number] Date of Birth/Sex: Treating RN: [birth year] (52 y.o. F) Primary Care Provider: [name] Other Clinician: Referring Provider: Treating Provider/Extender: Shea Stakes in Treatment: 16 Information Obtained From Patient Ear/Nose/Mouth/Throat Medical History: Past Medical History Notes: Left ear limited hearing Hematologic/Lymphatic Medical History: Positive for: Lymphedema - Left breast-gets Lymphadema tx [name], [name] ([id number]) [phone].pdf Page 8 of 9 Gastrointestinal Medical History: Past Medical History Notes: GERD Immunological Medical History: Past Medical History Notes: Wegener"s Granulomatosis Musculoskeletal Medical History: Past Medical History Notes: "RA like arthritis" as per patient Oncologic Medical History: Past Medical History Notes: Left Breast. 12/08/21- 01/29/22- 37 rounds of  Radiation Psychiatric Medical History: Past Medical History Notes: General Anxiety Immunizations Pneumococcal Vaccine: Received Pneumococcal Vaccination: Yes Received Pneumococcal Vaccination On or After 60th Birthday: No Implantable Devices Yes Hospitalization / Surgery History Type of Hospitalization/Surgery 03/25/22 Left Breast cyst excision; 11/12/21 Left Breast Lumpectomy with Radioactive seeds;Left breat Biopsy;Anterior Family and Social History Never smoker; Marital Status - Married; Alcohol Use: Moderate; Drug Use: No History; Caffeine Use: Daily; Financial Concerns: No; Food, Clothing or Shelter Needs: No; Support System Lacking: No; Transportation Concerns: No Electronic Signature(s) Signed: 08/19/2022 10:47:57 AM By: [name] MD FACS Entered By: [name] on 08/19/2022 09:41:17 -------------------------------------------------------------------------------- SuperBill Details Patient Name: Date of Service: [name] [name], [name] 08/19/2022 Medical Record Number: [id number] Patient Account Number: [id number] Date of Birth/Sex: Treating RN: [birth year] (52 y.o. 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 Facility Procedures : CPT4 Code: [id number] Description: [id number] - DEBRIDE WOUND 1ST 20 SQ CM OR < ICD-10 Diagnosis Description S21.002D Unspecified open wound of left breast, subsequent encounter T81.31XD  Disruption of external operation (surgical) wound, not elsewhere classified, sub Modifier: sequent encounter Quantity: 1 Physician Procedures : CPT4 Code Description Modifier [id number] - WC PHYS LEVEL 3 - EST PT 25 ICD-10 Diagnosis Description S21.002D Unspecified open wound of left breast, subsequent encounter T81.31XD Disruption of external operation (surgical) wound, not elsewhere  classified, subsequent encounter 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

## 2022-08-20 NOTE — Progress Notes (Signed)
JAHLAYA, POLA ([id number]) [id number] Page 1 of 2 Visit Report for 08/19/2022 Arrival Information Details Patient Name: Date of Service: [name], [name] 08/19/2022 10:00 [place] Record Number: [id number] Patient Account Number: [id number] Date of Birth/Sex: Treating RN: 1970-12-18 (52 y.o. [name], Linda Primary Care Honest Vanleer: [name] Other Clinician: Valeria Batman Referring Chrisy Hillebrand: Treating [place] Millon/Extender: Shea Stakes in Treatment: 88 Visit Information History Since Last Visit All ordered tests and consults were completed: Yes Patient Arrived: Ambulatory Added or deleted any medications: No Arrival Time: 09:45 Any new allergies or adverse reactions: No Accompanied By: None Had a fall or experienced change in No Transfer Assistance: None activities of daily living that may affect Patient Identification Verified: Yes risk of falls: Secondary Verification Process Completed: Yes Signs or symptoms of abuse/neglect since last visito No Patient Requires Transmission-Based Precautions: No Hospitalized since last visit: No Patient Has Alerts: No Implantable device outside of the clinic excluding No cellular tissue based products placed in the center since last visit: Pain Present Now: No Electronic Signature(s) Signed: 08/19/2022 1:35:33 PM By: Valeria Batman EMT Entered By: Valeria Batman on 08/19/2022 13:35:33 -------------------------------------------------------------------------------- Encounter Discharge Information Details Patient Name: Date of Service: [name], [name] NIE W. 08/19/2022 10:00 A M Medical Record Number: [id number] Patient Account Number: [id number] Date of Birth/Sex: Treating RN: 1971/03/31 (52 y.o. [name] Primary Care Ahlia Lemanski: [name] Other Clinician: Valeria Batman Referring Griselle Rufer: Treating Kamden Stanislaw/Extender: Shea Stakes in  Treatment: 28 Encounter Discharge Information Items Discharge Condition: Stable Ambulatory Status: Ambulatory Discharge Destination: Home Transportation: Private Auto Accompanied By: None Schedule Follow-up Appointment: Yes Clinical Summary of Care: Electronic Signature(s) Signed: 08/19/2022 1:39:09 PM By: Valeria Batman EMT Entered By: Valeria Batman on 08/19/2022 13:39:09 [name] ([id number].pdf Page 2 of 2 -------------------------------------------------------------------------------- Vitals Details Patient Name: Date of Service: [name], [name] 08/19/2022 10:00 A M Medical Record Number: [id number] Patient Account Number: [id number] Date of Birth/Sex: Treating RN: 05/26/1971 (52 y.o. [name] Primary Care Nimra Puccinelli: [name] Other Clinician: Valeria Batman Referring Candus Braud: Treating Rashun Grattan/Extender: [name], [name] Weeks in Treatment: 16 Vital Signs Time Taken: 09:51 Temperature (F): 97.0 Height (in): 63 Pulse (bpm): 78 Weight (lbs): 170 Respiratory Rate (breaths/min): 20 Body Mass Index (BMI): 30.1 Blood Pressure (mmHg): 110/59 Reference Range: 80 - 120 mg / dl Electronic Signature(s) Signed: 08/19/2022 1:36:00 PM By: Valeria Batman EMT Entered By: Valeria Batman on 08/19/2022 13:36:00

## 2022-08-20 NOTE — Progress Notes (Signed)
VELETTA, HARGREAVES ([id number]) [id number].pdf Page 1 of 2 Visit Report for 08/19/2022 Problem List Details Patient Name: Date of Service: [name], [name] 08/19/2022 10:00 A M Medical Record Number: [id number] Patient Account Number: [id number] Date of Birth/Sex: Treating RN: [birth year] (52 y.o. 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 08/19/2022 Medical Record Number: [id number] Patient Account Number: [id number] Date of Birth/Sex: Treating RN: 1970-10-18 (52 y.o. 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 Facility Procedures : CPT4 Code Description: [id number]-(Facility Use Only) HBOT full body chamber, 59mn , ICD-10 Diagnosis Description L59.8 Other specified disorders of the skin and subcutaneous tissue related to rad S21.002D Unspecified open wound of left breast,  subsequent encounter T81.31XD Disruption of external operation (surgical) wound, not elsewhere classified, 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 Modifier: iation subsequent encou Quantity: 4 nter Physician Procedures : CPT4 Code Description Modifier [id number] PHYS HYPERBARIC OXYGEN THERAPY ICD-10 Diagnosis Description 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 S21.002D Unspecified open wound of left breast,  subsequent encounter T81.31XD Disruption of external operation (surgical) wound, not elsewhere classified, subsequent enco 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

## 2022-08-20 NOTE — Progress Notes (Signed)
CHARMAN, SPROULS ([id number]) [id number] Page 1 of 2 Visit Report for 08/19/2022 HBO Details Patient Name: Date of Service: [name], [name] 08/19/2022 10:00 A M Medical Record Number: [id number] Patient Account Number: [id number] Date of Birth/Sex: Treating RN: October 03, 1970 (52 y.o. 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 HBO Treatment Details Treatment Number: 15 Patient Type: Outpatient Chamber Type: Monoplace Chamber Serial #: [id number] Treatment Protocol: 2.0 ATA with 90 minutes oxygen, and no air breaks Treatment Details Compression Rate Down: 2.0 psi / minute De-Compression Rate Up: 2.0 psi / minute Air breaks and breathing Decompress Decompress Compress Tx Pressure Begins Reached periods Begins Ends (leave unused spaces blank) Chamber Pressure (ATA 1 2 ------2 1 ) Clock Time (24 hr) 10:13 10:23 - - - - - - 11:53 12:01 Treatment Length: 108 (minutes) Treatment Segments: 4 Vital Signs Capillary Blood Glucose Reference Range: 80 - 120 mg / dl HBO Diabetic Blood Glucose Intervention Range: <131 mg/dl or >249 mg/dl Time Vitals Blood Respiratory Capillary Blood Glucose Pulse Action Type: Pulse: Temperature: Taken: Pressure: Rate: Glucose (mg/dl): Meter #: Oximetry (%) Taken: Pre 09:51 110/59 78 20 97 Post 12:03 121/87 79 18 98 Treatment Response Treatment Toleration: Well Treatment Completion Status: Treatment Completed without Adverse Event Physician HBO Attestation: I certify that I supervised this HBO treatment in accordance with Medicare guidelines. A trained emergency  response team is readily available per Yes hospital policies and procedures. Continue HBOT as ordered. Yes Electronic Signature(s) Signed: 08/19/2022 1:53:25 PM By: [name] MD FACS Previous Signature: 08/19/2022 1:37:56 PM Version By: Valeria Batman EMT Entered By: [name] on 08/19/2022 13:53:24 [name] ([id number].pdf Page 2 of 2 -------------------------------------------------------------------------------- HBO Safety Checklist Details Patient Name: Date of Service: [name] [name] 08/19/2022 10:00 A M Medical Record Number: [id number] Patient Account Number: [id number] Date of Birth/Sex: Treating RN: Jun 24, 1971 (52 y.o. [name], [name] Primary Care Zykeriah Mathia: [name] Other Clinician: Valeria Batman Referring Shuntay Everetts: Treating Rashada Klontz/Extender: [name], [name] Weeks in Treatment: 16 HBO Safety Checklist Items Safety Checklist Consent Form Signed Patient voided / foley secured and emptied When did you last eato 0800 Last dose of injectable or oral agent NA Ostomy pouch emptied and vented if applicable NA All implantable devices assessed, documented and approved NA Intravenous access site secured and place NA Valuables secured Linens and cotton and cotton/polyester blend (less than 51% polyester) Personal oil-based products / skin lotions / body lotions removed Wigs or hairpieces removed Smoking or tobacco materials removed Books / newspapers / magazines / loose paper removed Cologne, aftershave, perfume and deodorant removed Jewelry removed (may wrap wedding band) Make-up removed Hair care products removed Battery operated devices (external) removed Heating patches and chemical warmers removed Titanium eyewear removed NA Nail polish cured greater than 10 hours 08/13/2021 Casting material cured greater than 10 hours NA Hearing aids removed NA Loose dentures or partials  removed NA Prosthetics have been removed NA Patient demonstrates correct use of air break device (if applicable) Patient concerns have been addressed Patient grounding bracelet on and cord attached to chamber Specifics for Inpatients (complete in addition to above) Medication sheet sent with patient NA Intravenous medications needed or due during therapy sent with patient NA  Drainage tubes (e.g. nasogastric tube or chest tube secured and vented) NA Endotracheal or Tracheotomy tube secured NA Cuff deflated of air and inflated with saline NA Airway suctioned NA Electronic Signature(s) Signed: 08/19/2022 1:36:57 PM By: Valeria Batman EMT Entered By: Valeria Batman on 08/19/2022 13:36:57

## 2022-08-20 NOTE — Progress Notes (Signed)
[name], [name] ([id number]) [id number] Page 1 of 6 Visit Report for 08/19/2022 Arrival Information Details Patient Name: Date of Service: [name], [name] 08/19/2022 8:45 A M Medical Record Number: [id number] Patient Account Number: [id number] Date of Birth/Sex: Treating RN: [birth year] (52 y.o. F) Primary Care Emalene Welte: [name] Other Clinician: Referring Kem Hensen: Treating Desarae Placide/Extender: [name], [name] Weeks in Treatment: 14 Visit Information History Since Last Visit All ordered tests and consults were completed: No Patient Arrived: Ambulatory Added or deleted any medications: No Arrival Time: 08:51 Any new allergies or adverse reactions: No Accompanied By: self Had a fall or experienced change in No Transfer Assistance: None activities of daily living that may affect Patient Identification Verified: Yes risk of falls: Secondary Verification Process Completed: Yes Signs or symptoms of abuse/neglect since last visito No Patient Requires Transmission-Based Precautions: No Hospitalized since last visit: No Patient Has Alerts: No Implantable device outside of the clinic excluding No cellular tissue based products placed in the center since last visit: Pain Present Now: No Electronic Signature(s) Signed: 08/19/2022 9:50:06 AM By: [name] Entered By: [name] on 08/19/2022 08:51:56 -------------------------------------------------------------------------------- Encounter Discharge Information Details Patient Name: Date of Service: [name], [name] 08/19/2022 8:45 A M Medical Record Number: [id number] Patient Account Number: [id number] Date of Birth/Sex: Treating RN: 07-03-1971 (52 y.o. [name] Primary Care Zayd Bonet: [name] Other Clinician: Referring Marcell Pfeifer: Treating Kischa Altice/Extender: Shea Stakes in Treatment: 53 Encounter Discharge Information Items Post  Procedure Vitals Discharge Condition: Stable Temperature (F): 98.6 Ambulatory Status: Ambulatory Pulse (bpm): 87 Discharge Destination: Home Respiratory Rate (breaths/min): 18 Transportation: Private Auto Blood Pressure (mmHg): 121/75 Accompanied By: self Schedule Follow-up Appointment: Yes Clinical Summary of Care: Patient Declined Electronic Signature(s) Signed: 08/19/2022 1:34:02 PM By: [name] EMT Entered By: [name] on 08/19/2022 13:34:02 [name] ([id number] Page 2 of 6 -------------------------------------------------------------------------------- Lower Extremity Assessment Details Patient Name: Date of Service: [name] [name] 08/19/2022 8:45 A M Medical Record Number: [id number] Patient Account Number: [id number] Date of Birth/Sex: Treating RN: 09/24/1970 (52 y.o. [name] Primary Care Oscar Hank: [name] Other Clinician: Referring Khanh Cordner: Treating Angelli Baruch/Extender: Shea Stakes in Treatment: 16 Electronic Signature(s) Signed: 08/19/2022 4:48:58 PM By: Baruch Gouty RN, BSN Entered By: Baruch Gouty on 08/19/2022 09:22:38 -------------------------------------------------------------------------------- Multi Wound Chart Details Patient Name: Date of Service: [name], [name] 08/19/2022 8:45 A M Medical Record Number: [id number] Patient Account Number: [id number] Date of Birth/Sex: Treating RN: 03/06/1971 (52 y.o. F) Primary Care Shawana Knoch: [name] Other Clinician: Referring Berlene Dixson: Treating Javarius Tsosie/Extender: [name], [name] Weeks in Treatment: 16 Vital Signs Height(in): 63 Pulse(bpm): 87 Weight(lbs): 170 Blood Pressure(mmHg): 121/75 Body Mass Index(BMI): 30.1 Temperature(F): 98.6 Respiratory Rate(breaths/min): 20 [1:Photos:] [N/A:N/A] Left Breast N/A N/A Wound Location: Surgical Injury N/A N/A Wounding  Event: Malignant Wound N/A N/A Primary Etiology: Lymphedema N/A N/A Comorbid History: 04/03/2022 N/A N/A Date Acquired: 16 N/A N/A Weeks of Treatment: Open N/A N/A Wound Status: No N/A N/A Wound Recurrence: 1.4x0.8x0.1 N/A N/A Measurements L x W x D (cm) 0.88 N/A N/A A (cm) : rea 0.088 N/A N/A Volume (cm) : 93.40% N/A N/A % Reduction in A rea: 98.30% N/A N/A % Reduction in Volume: Full Thickness Without Exposed N/A N/A Classification: Support Structures Medium N/A N/A Exudate Amount: Serosanguineous N/A N/A Exudate Type: red, brown N/A N/A Exudate Color: Distinct, outline attached N/A N/A Wound Margin: Large (67-100%) N/A N/A Granulation Amount: Red, Pink N/A  N/A Granulation Quality: Small (1-33%) N/A N/A Necrotic Amount: [name], [name] ([id number]) [id number] Page 3 of 6 Fat Layer (Subcutaneous Tissue): Yes N/A N/A Exposed Structures: Fascia: No Tendon: No Muscle: No Joint: No Bone: No Small (1-33%) N/A N/A Epithelialization: Debridement - Selective/Open Wound N/A N/A Debridement: Pre-procedure Verification/Time Out 09:30 N/A N/A Taken: Necrotic/Eschar, Slough N/A N/A Tissue Debrided: Non-Viable Tissue N/A N/A Level: 0.7 N/A N/A Debridement A (sq cm): rea Curette N/A N/A Instrument: Minimum N/A N/A Bleeding: Pressure N/A N/A Hemostasis A chieved: 0 N/A N/A Procedural Pain: 0 N/A N/A Post Procedural Pain: Procedure was tolerated well N/A N/A Debridement Treatment Response: 1.4x0.8x0.1 N/A N/A Post Debridement Measurements L x W x D (cm) 0.088 N/A N/A Post Debridement Volume: (cm) Induration: Yes N/A N/A Periwound Skin Texture: No Abnormalities Noted N/A N/A Periwound Skin Moisture: No Abnormalities Noted N/A N/A Periwound Skin Color: No Abnormality N/A N/A Temperature: Debridement N/A N/A Procedures Performed: Treatment Notes Electronic Signature(s) Signed: 08/19/2022 9:40:39 AM By: [name] MD FACS Entered By: [name] on 08/19/2022 09:40:39 -------------------------------------------------------------------------------- Multi-Disciplinary Care Plan Details Patient Name: Date of Service: [name], [name] 08/19/2022 8:45 A M Medical Record Number: [id number] Patient Account Number: [id number] Date of Birth/Sex: Treating RN: October 06, 1970 (52 y.o. [name] Primary Care Bryant Lipps: [name] Other Clinician: Referring Bianca Vester: Treating Kresha Abelson/Extender: Shea Stakes in Treatment: [place] reviewed with physician Active Inactive Wound/Skin Impairment Nursing Diagnoses: Impaired tissue integrity Goals: Patient/caregiver will verbalize understanding of skin care regimen Date Initiated: 04/24/2022 Target Resolution Date: 10/02/2022 Goal Status: Active Interventions: Assess ulceration(s) every visit Treatment Activities: Skin care regimen initiated : 04/24/2022 Notes: Electronic Signature(s) Signed: 08/19/2022 4:48:58 PM By: Baruch Gouty RN, BSN Entered By: Baruch Gouty on 08/19/2022 09:29:07 [name] ([id number] Page 4 of 6 -------------------------------------------------------------------------------- Pain Assessment Details Patient Name: Date of Service: [name] [name] 08/19/2022 8:45 A M Medical Record Number: [id number] Patient Account Number: [id number] Date of Birth/Sex: Treating RN: 1971/01/11 (52 y.o. F) Primary Care Tanylah Schnoebelen: [name] Other Clinician: Referring Dolores Ewing: Treating Divine Imber/Extender: [name], [name] Weeks in Treatment: 16 Active Problems Location of Pain Severity and Description of Pain Patient Has Paino No Site Locations Rate the pain. Current Pain Level: 0 Pain Management and Medication Current Pain Management: Electronic Signature(s) Signed: 08/19/2022 4:48:58 PM By: Baruch Gouty RN, BSN Entered By: Baruch Gouty on 08/19/2022 [id number] -------------------------------------------------------------------------------- Patient/Caregiver Education Details Patient Name: Date of Service: [name], [name] NIE W. 2/14/2024andnbsp8:45 A M Medical Record Number: [id number] Patient Account Number: [id number] Date of Birth/Gender: Treating RN: Jul 08, 1970 (52 y.o. [name] Primary Care Physician: [name] Other Clinician: Referring Physician: Treating Physician/Extender: Shea Stakes in Treatment: 87 Education Assessment Education Provided To: Patient Education Topics Provided Hyperbaric Oxygenation: Methods: Explain/Verbal [name], [name] ([id number]) [id number] Page 5 of 6 Responses: Reinforcements needed, State content correctly Wound/Skin Impairment: Methods: Explain/Verbal Responses: Reinforcements needed, State content correctly Electronic Signature(s) Signed: 08/19/2022 4:48:58 PM By: Baruch Gouty RN, BSN Entered By: Baruch Gouty on 08/19/2022 09:29:35 -------------------------------------------------------------------------------- Wound Assessment Details Patient Name: Date of Service: [name] [name] 08/19/2022 8:45 A M Medical Record Number: [id number] Patient Account Number: [id number] Date of Birth/Sex: Treating RN: Jul 02, 1971 (52 y.o. 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 Wound Status: Open  Wounding Event: Surgical Injury Comorbid History: Lymphedema Date Acquired: 04/03/2022 Weeks Of Treatment: 16 Clustered Wound: No Photos Wound Measurements Length: (cm) 1.4 Width: (cm) 0.8 Depth: (cm) 0.1 Area: (cm) 0.88 Volume: (cm) 0.088 %  Reduction in Area: 93.4% % Reduction in Volume: 98.3% Epithelialization: Small (1-33%) Wound Description Classification: Full Thickness Without Exposed Suppor Wound Margin: Distinct, outline attached Exudate Amount: Medium Exudate Type: Serosanguineous Exudate Color: red, brown t Structures Foul Odor After Cleansing: No Slough/Fibrino Yes Wound Bed Granulation Amount: Large (67-100%) Exposed Structure Granulation Quality: Red, Pink Fascia Exposed: No Necrotic Amount: Small (1-33%) Fat Layer (Subcutaneous Tissue) Exposed: Yes Tendon Exposed: No Muscle Exposed: No Joint Exposed: No Bone Exposed: No [name], [name] ([id number].pdf Page 6 of 6 Periwound Skin Texture Texture Color No Abnormalities Noted: No No Abnormalities Noted: Yes Induration: Yes Temperature / Pain Temperature: No Abnormality Moisture No Abnormalities Noted: Yes Treatment Notes Wound #1 (Breast) Wound Laterality: Left Cleanser Normal Saline Discharge Instruction: Cleanse the wound with Normal Saline prior to applying a clean dressing using gauze sponges, not tissue or cotton balls. Soap and Water Discharge Instruction: May shower and wash wound with dial antibacterial soap and water prior to dressing change. [place] Ancillary Kit - 15 Day Supply Discharge Instruction: Use supplies as instructed; Kit contains: (15) Saline Bullets; (15) 3x3 Gauze; 15 pr Gloves Peri-Wound Care Topical Primary Dressing Promogran Prisma Matrix, 4.34 (sq in) (silver collagen) Discharge Instruction: Moisten collagen with saline or hydrogel Secondary Dressing Zetuvit Plus Silicone Border Dressing 5x5 (in/in) Discharge Instruction: Apply silicone border over primary dressing as directed. Secured With Compression Wrap Compression Stockings Environmental education officer) Signed: 08/19/2022 4:48:58 PM By: Baruch Gouty RN, BSN Entered By: Baruch Gouty on 08/19/2022  09:34:57 -------------------------------------------------------------------------------- Vitals Details Patient Name: Date of Service: [name], [place] W. 08/19/2022 8:45 A M Medical Record Number: [id number] Patient Account Number: [id number] Date of Birth/Sex: Treating RN: 1971-02-15 (52 y.o. F) Primary Care Kymari Nuon: [name] Other Clinician: Referring Cedrik Heindl: Treating Alazay Leicht/Extender: [name], [name] Weeks in Treatment: 16 Vital Signs Time Taken: 08:51 Temperature (F): 98.6 Height (in): 63 Pulse (bpm): 87 Weight (lbs): 170 Respiratory Rate (breaths/min): 20 Body Mass Index (BMI): 30.1 Blood Pressure (mmHg): 121/75 Reference Range: 80 - 120 mg / dl Electronic Signature(s) Signed: 08/19/2022 9:50:06 AM By: [name] Entered By: [name] on 08/19/2022 08:52:15

## 2022-08-21 ENCOUNTER — Encounter (HOSPITAL_BASED_OUTPATIENT_CLINIC_OR_DEPARTMENT_OTHER): Payer: 59 | Admitting: General Surgery

## 2022-08-24 ENCOUNTER — Encounter (HOSPITAL_BASED_OUTPATIENT_CLINIC_OR_DEPARTMENT_OTHER): Payer: 59 | Admitting: General Surgery

## 2022-08-25 ENCOUNTER — Encounter (HOSPITAL_BASED_OUTPATIENT_CLINIC_OR_DEPARTMENT_OTHER): Payer: 59 | Admitting: General Surgery

## 2022-08-25 DIAGNOSIS — L59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: Secondary | ICD-10-CM | POA: Diagnosis not present

## 2022-08-25 DIAGNOSIS — T8131XD Disruption of external operation (surgical) wound, not elsewhere classified, subsequent encounter: Secondary | ICD-10-CM | POA: Diagnosis not present

## 2022-08-26 ENCOUNTER — Encounter (HOSPITAL_BASED_OUTPATIENT_CLINIC_OR_DEPARTMENT_OTHER): Payer: 59 | Admitting: General Surgery

## 2022-08-26 DIAGNOSIS — L59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: Secondary | ICD-10-CM | POA: Diagnosis not present

## 2022-08-26 DIAGNOSIS — T8131XD Disruption of external operation (surgical) wound, not elsewhere classified, subsequent encounter: Secondary | ICD-10-CM | POA: Diagnosis not present

## 2022-08-26 DIAGNOSIS — S21002A Unspecified open wound of left breast, initial encounter: Secondary | ICD-10-CM | POA: Diagnosis not present

## 2022-08-26 NOTE — Progress Notes (Signed)
VELEN, FRAZE ([id number]) [id number] Page 1 of 2 Visit Report for 08/25/2022 Arrival Information Details Patient Name: Date of Service: [name], [name] 08/25/2022 8:00 A M Medical Record Number: [id number] Patient Account Number: [id number] Date of Birth/Sex: Treating RN: 1971/03/01 (52 y.o. [name], Lauren Primary Care Vicie Cech: [name] Other Clinician: Donavan Burnet Referring Afrika Brick: Treating Malikah Lakey/Extender: Shea Stakes in Treatment: 73 Visit Information History Since Last Visit All ordered tests and consults were completed: Yes Patient Arrived: Ambulatory Added or deleted any medications: No Arrival Time: 07:37 Any new allergies or adverse reactions: No Accompanied By: self Had a fall or experienced change in No Transfer Assistance: None activities of daily living that may affect Patient Identification Verified: Yes risk of falls: Secondary Verification Process Completed: Yes Signs or symptoms of abuse/neglect since last visito No Patient Requires Transmission-Based Precautions: No Hospitalized since last visit: No Patient Has Alerts: No Implantable device outside of the clinic excluding No cellular tissue based products placed in the center since last visit: Pain Present Now: No Electronic Signature(s) Signed: 08/25/2022 9:24:18 AM By: Donavan Burnet CHT EMT BS , , Entered By: Donavan Burnet on 08/25/2022 09:24:18 -------------------------------------------------------------------------------- Encounter Discharge Information Details Patient Name: Date of Service: [name], Enetai W. 08/25/2022 8:00 A M Medical Record Number: [id number] Patient Account Number: [id number] Date of Birth/Sex: Treating RN: 03/13/1971 (52 y.o. [name], Lauren Primary Care Senay Sistrunk: [name] Other Clinician: Donavan Burnet Referring Madisson Kulaga: Treating Kiyaan Haq/Extender: Shea Stakes in Treatment: 76 Encounter Discharge Information Items Discharge Condition: Stable Ambulatory Status: Ambulatory Discharge Destination: Home Transportation: Private Auto Accompanied By: self Schedule Follow-up Appointment: No Clinical Summary of Care: Electronic Signature(s) Signed: 08/25/2022 12:53:26 PM By: Donavan Burnet CHT EMT BS , , Entered By: Donavan Burnet on 08/25/2022 12:53:26 [name] ([id number]) [id number] Page 2 of 2 -------------------------------------------------------------------------------- Vitals Details Patient Name: Date of Service: [name], [name] 08/25/2022 8:00 A M Medical Record Number: [id number] Patient Account Number: [id number] Date of Birth/Sex: Treating RN: 23-Jul-1970 (52 y.o. [name], Lauren Primary Care Arriona Prest: [name] Other Clinician: Donavan Burnet Referring Jamyia Fortune: Treating Jerrie Gullo/Extender: [name], [name] Weeks in Treatment: 17 Vital Signs Time Taken: 07:50 Temperature (F): 97.5 Height (in): 63 Pulse (bpm): 76 Weight (lbs): 170 Respiratory Rate (breaths/min): 18 Body Mass Index (BMI): 30.1 Blood Pressure (mmHg): 119/91 Reference Range: 80 - 120 mg / dl Electronic Signature(s) Signed: 08/25/2022 12:42:03 PM By: Donavan Burnet CHT EMT BS , , Entered By: Donavan Burnet on 08/25/2022 12:42:03

## 2022-08-26 NOTE — Progress Notes (Signed)
CALLYSTA, STRICKLIN ([id number]) [id number] Page 1 of 2 Visit Report for 08/25/2022 HBO Details Patient Name: Date of Service: [name], [name] 08/25/2022 8:00 A M Medical Record Number: [id number] Patient Account Number: [id number] Date of Birth/Sex: Treating RN: 10/07/1970 (52 y.o. 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 HBO Treatment Details Treatment Number: 16 Patient Type: Outpatient Chamber Type: Monoplace Chamber Serial #: [id number] Treatment Protocol: 2.0 ATA with 90 minutes oxygen, and no air breaks Treatment Details Compression Rate Down: 1.5 psi / minute De-Compression Rate Up: 2.0 psi / minute Air breaks and breathing Decompress Decompress Compress Tx Pressure Begins Reached periods Begins Ends (leave unused spaces blank) Chamber Pressure (ATA 1 2 ------2 1 ) Clock Time (24 hr) 07:55 08:04 - - - - - - 09:34 09:43 Treatment Length: 108 (minutes) Treatment Segments: 4 Vital Signs Capillary Blood Glucose Reference Range: 80 - 120 mg / dl HBO Diabetic Blood Glucose Intervention Range: <131 mg/dl or >249 mg/dl Type: Time Vitals Blood Respiratory Capillary Blood Glucose Pulse Action Pulse: Temperature: Taken: Pressure: Rate: Glucose (mg/dl): Meter #: Oximetry (%) Taken: Pre 07:50 119/91 76 18 97.5 none per protocol Post 09:46 106/74 78 18 96.8 none per protocol Treatment Response Treatment Toleration: Well Treatment Completion Status: Treatment Completed without Adverse Event Treatment Notes Patient arrived, prepared for treatment. Vital signs within normal limits.  After performing safety check, patient was placed in the chamber which was compressed at 1 psi/min until confirming normal ear equalization and then the rate set was increased to 2 psi/min (8psig). Patient tolerated treatment well and subsequent decompression of the chamber at 2 psi/min. Patient denied any symptoms of barotrauma. Patient was stable upon discharge. Physician HBO Attestation: I certify that I supervised this HBO treatment in accordance with Medicare guidelines. A trained emergency response team is readily available per Yes hospital policies and procedures. Continue HBOT as ordered. Yes Electronic Signature(s) Signed: 08/25/2022 3:07:40 PM By: [name] MD FACS Previous Signature: 08/25/2022 12:52:21 PM Version By: Donavan Burnet CHT EMT BS , , Previous Signature: 08/25/2022 12:51:50 PM Version By: Donavan Burnet CHT EMT BS , , Entered By: [name] on 08/25/2022 15:07:40 [name] ([id number].pdf Page 2 of 2 -------------------------------------------------------------------------------- HBO Safety Checklist Details Patient Name: Date of Service: [name] [name] 08/25/2022 8:00 A M Medical Record Number: [id number] Patient Account Number: [id number] Date of Birth/Sex: Treating RN: [birth year] (52 y.o. [name], Lauren Primary Care Deyon Chizek: [name] Other Clinician: Donavan Burnet Referring Tarez Bowns: Treating Kona Yusuf/Extender: [name], [name] Weeks in Treatment: 17 HBO Safety Checklist Items Safety Checklist Consent Form Signed Patient voided / foley secured and emptied When did you last eato 0730 Last dose of injectable or oral agent N/A Ostomy pouch emptied and vented if applicable NA All implantable devices assessed, documented and approved NA Intravenous access site secured and place NA Valuables secured Linens and cotton and cotton/polyester blend (less than 51%  polyester) Personal oil-based products / skin lotions / body lotions removed Wigs or hairpieces removed NA Smoking or tobacco materials removed NA Books / newspapers / magazines / loose paper removed Cologne, aftershave, perfume and deodorant removed Jewelry removed (may wrap wedding band) Make-up removed Hair care products removed Battery  operated devices (external) removed Heating patches and chemical warmers removed Titanium eyewear removed Nail polish cured greater than 10 hours greater than 10 hours Casting material cured greater than 10 hours NA Hearing aids removed NA Loose dentures or partials removed NA Prosthetics have been removed NA Patient demonstrates correct use of air break device (if applicable) Patient concerns have been addressed Patient grounding bracelet on and cord attached to chamber Specifics for Inpatients (complete in addition to above) Medication sheet sent with patient NA Intravenous medications needed or due during therapy sent with patient NA Drainage tubes (e.g. nasogastric tube or chest tube secured and vented) NA Endotracheal or Tracheotomy tube secured NA Cuff deflated of air and inflated with saline NA Airway suctioned NA Notes Paper version used prior to treatment. Electronic Signature(s) Signed: 08/25/2022 12:48:19 PM By: Donavan Burnet CHT EMT BS , , Entered By: Donavan Burnet on 08/25/2022 12:48:19

## 2022-08-26 NOTE — Progress Notes (Signed)
LYSSETTE, KIEU ([id number]) [id number] Page 1 of 1 Visit Report for 08/25/2022 SuperBill Details Patient Name: Date of Service: [name] 08/25/2022 Medical Record Number: [id number] Patient Account Number: [id number] Date of Birth/Sex: Treating RN: [birth year] (52 y.o. 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 Facility Procedures CPT4 Code Description Modifier Quantity [id number]-(Facility Use Only) HBOT full body chamber, 21mn , 4 ICD-10 Diagnosis Description 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 S21.002D Unspecified open wound of left breast, subsequent encounter T81.31XD Disruption of external operation (surgical) wound, not elsewhere classified, subsequent encounter 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 Physician Procedures Quantity CPT4 Code Description Modifier [id number] PHYS HYPERBARIC OXYGEN THERAPY 1 ICD-10 Diagnosis Description 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

## 2022-08-27 ENCOUNTER — Encounter (HOSPITAL_BASED_OUTPATIENT_CLINIC_OR_DEPARTMENT_OTHER): Payer: 59 | Admitting: General Surgery

## 2022-08-27 DIAGNOSIS — T8131XD Disruption of external operation (surgical) wound, not elsewhere classified, subsequent encounter: Secondary | ICD-10-CM | POA: Diagnosis not present

## 2022-08-27 NOTE — Progress Notes (Signed)
[name], [name] ([id number]) [id number] Page 1 of 6 Visit Report for 08/26/2022 Arrival Information Details Patient Name: Date of Service: [name] 08/26/2022 9:15 A M Medical Record Number: [id number] Patient Account Number: [id number] Date of Birth/Sex: Treating RN: 29-Sep-1970 (52 y.o. [name] Other Clinician: Referring [name]: Treating [name]/Extender: [name] in Treatment: 28 Visit Information History Since Last Visit Added or deleted any medications: No Patient Arrived: Ambulatory Any new allergies or adverse reactions: No Arrival Time: 09:12 Had a fall or experienced change in No Accompanied By: self activities of daily living that may affect Transfer Assistance: None risk of falls: Patient Identification Verified: Yes Signs or symptoms of abuse/neglect since last visito No Secondary Verification Process Completed: Yes Hospitalized since last visit: No Patient Requires Transmission-Based Precautions: No Implantable device outside of the clinic excluding No Patient Has Alerts: No cellular tissue based products placed in the center since last visit: Has Dressing in Place as Prescribed: Yes Pain Present Now: No Electronic Signature(s) Signed: 08/26/2022 4:21:22 PM By: [name] Entered By: [name] on 08/26/2022 09:13:46 -------------------------------------------------------------------------------- Encounter Discharge Information Details Patient Name: Date of Service: [name], [name] 08/26/2022 9:15 A M Medical Record Number: [id number] Patient Account Number: [id number] Date of Birth/Sex: Treating RN: 1970/08/03 (52 y.o. [name] Primary Care [name]: [name] Other Clinician: Referring [name]: Treating [name]/Extender: [name] in Treatment: 46 Encounter Discharge  Information Items Post Procedure Vitals Discharge Condition: Stable Temperature (F): 97.7 Ambulatory Status: Ambulatory Pulse (bpm): 84 Discharge Destination: Home Respiratory Rate (breaths/min): 18 Transportation: Private Auto Blood Pressure (mmHg): 129/81 Accompanied By: self Schedule Follow-up Appointment: Yes Clinical Summary of Care: Patient Declined Electronic Signature(s) Signed: 08/26/2022 4:21:22 PM By: [name] Entered By: [name] on 08/26/2022 09:31:10 [name] ([id number]) [id number] Page 2 of 6 -------------------------------------------------------------------------------- Lower Extremity Assessment Details Patient Name: Date of Service: [name] 08/26/2022 9:15 A M Medical Record Number: [id number] Patient Account Number: [id number] Date of Birth/Sex: Treating RN: 1970/12/31 (52 y.o. [name] Primary Care Leonia Heatherly: [name] Other Clinician: Referring Kamryn Gauthier: Treating Yazlyn Wentzel/Extender: [name], [name] in Treatment: 17 Electronic Signature(s) Signed: 08/26/2022 4:21:22 PM By: [name] Entered By: [name] on 08/26/2022 09:13:55 -------------------------------------------------------------------------------- Multi Wound Chart Details Patient Name: Date of Service: [name], [name] 08/26/2022 9:15 A M Medical Record Number: [id number] Patient Account Number: [id number] Date of Birth/Sex: Treating RN: [birth year] (52 y.o. F) Primary Care [name]: [name] Other Clinician: Referring Izadora Roehr: Treating Peityn Payton/Extender: [name], [name] in Treatment: 17 Vital Signs Height(in): 63 Pulse(bpm): 84 Weight(lbs): 170 Blood Pressure(mmHg): 129/81 Body Mass Index(BMI): 30.1 Temperature(F): 97.7 Respiratory Rate(breaths/min): 18 [1:Photos:] [N/A:N/A] Left Breast N/A N/A Wound Location: Surgical Injury N/A  N/A Wounding Event: Malignant Wound N/A N/A Primary Etiology: Lymphedema N/A N/A Comorbid History: 04/03/2022 N/A N/A Date Acquired: 81 N/A N/A [name] of Treatment: Open N/A N/A Wound Status: No N/A N/A Wound Recurrence: 1.1x1.1x0.1 N/A N/A Measurements L x W x D (cm) 0.95 N/A N/A A (cm) : rea 0.095 N/A N/A Volume (cm) : 92.90% N/A N/A % Reduction in A rea: 98.20% N/A N/A % Reduction in Volume: Full Thickness Without Exposed N/A N/A Classification: Support Structures Medium N/A N/A Exudate Amount: Serosanguineous N/A N/A Exudate Type: red, brown N/A N/A Exudate Color: Distinct, outline attached N/A N/A Wound Margin: Large (67-100%) N/A N/A Granulation Amount: Red, Pink N/A N/A Granulation  Quality: Small (1-33%) N/A N/A Necrotic Amount: [name] ([id number]) [id number] Page 3 of 6 Fat Layer (Subcutaneous Tissue): Yes N/A N/A Exposed Structures: Fascia: No Tendon: No Muscle: No Joint: No Bone: No Small (1-33%) N/A N/A Epithelialization: Debridement - Selective/Open Wound N/A N/A Debridement: Pre-procedure Verification/Time Out 09:22 N/A N/A Taken: Lidocaine 4% Topical Solution N/A N/A Pain Control: Slough N/A N/A Tissue Debrided: Non-Viable Tissue N/A N/A Level: 1.21 N/A N/A Debridement A (sq cm): rea Curette N/A N/A Instrument: Minimum N/A N/A Bleeding: Pressure N/A N/A Hemostasis A chieved: Procedure was tolerated well N/A N/A Debridement Treatment Response: 1.1x1.1x0.1 N/A N/A Post Debridement Measurements L x W x D (cm) 0.095 N/A N/A Post Debridement Volume: (cm) Induration: Yes N/A N/A Periwound Skin Texture: No Abnormalities Noted N/A N/A Periwound Skin Moisture: No Abnormalities Noted N/A N/A Periwound Skin Color: No Abnormality N/A N/A Temperature: Debridement N/A N/A Procedures Performed: Treatment Notes Electronic Signature(s) Signed: 08/26/2022 9:25:28 AM By: [name] MD  FACS Entered By: [name] on 08/26/2022 09:25:28 -------------------------------------------------------------------------------- Multi-Disciplinary Care Plan Details Patient Name: Date of Service: [name], [place] W. 08/26/2022 9:15 A M Medical Record Number: [id number] Patient Account Number: [id number] Date of Birth/Sex: Treating RN: [birth year] (52 y.o. [name] Primary Care Erisa Mehlman: [name] Other Clinician: Referring Joclynn Lumb: Treating Jamerson Vonbargen/Extender: [name] in Treatment: [place] reviewed with physician Active Inactive Wound/Skin Impairment Nursing Diagnoses: Impaired tissue integrity Goals: Patient/caregiver will verbalize understanding of skin care regimen Date Initiated: 04/24/2022 Target Resolution Date: 10/02/2022 Goal Status: Active Interventions: Assess ulceration(s) every visit Treatment Activities: Skin care regimen initiated : 04/24/2022 Notes: Electronic Signature(s) Signed: 08/26/2022 4:21:22 PM By: [name] Entered By: [name] on 08/26/2022 09:29:50 [name] ([id number]) [id number] Page 4 of 6 -------------------------------------------------------------------------------- Pain Assessment Details Patient Name: Date of Service: [name] 08/26/2022 9:15 A M Medical Record Number: [id number] Patient Account Number: [id number] Date of Birth/Sex: Treating RN: [birth year] (52 y.o. [name] Primary Care Bryce Kimble: [name] Other Clinician: Referring Aitanna Haubner: Treating Denys Labree/Extender: [name] in Treatment: 17 Active Problems Location of Pain Severity and Description of Pain Patient Has Paino No Site Locations Rate the pain. Current Pain Level: 0 Pain Management and Medication Current Pain Management: Electronic Signature(s) Signed: 08/26/2022 4:21:22 PM By:  [name] Entered By: [name] on 08/26/2022 09:13:52 -------------------------------------------------------------------------------- Patient/Caregiver Education Details Patient Name: Date of Service: [name], [name] NIE W. 2/21/2024andnbsp9:15 [place] Record Number: [id number] Patient Account Number: [id number] Date of Birth/Gender: Treating RN: 29-May-1971 (52 y.o. [name] Primary Care Physician: [name] Other Clinician: Referring Physician: Treating Physician/Extender: [name] in Treatment: 29 Education Assessment Education Provided To: Patient Education Topics Provided Wound/Skin Impairment: Methods: Explain/Verbal Responses: Reinforcements needed, State content correctly [name], [name] ([id number]) [id number] Page 5 of 6 Electronic Signature(s) Signed: 08/26/2022 4:21:22 PM By: [name] Entered By: [name] on 08/26/2022 09:30:40 -------------------------------------------------------------------------------- Wound Assessment Details Patient Name: Date of Service: [name] 08/26/2022 9:15 A M Medical Record Number: [id number] Patient Account Number: [id number] Date of Birth/Sex: Treating RN: 1971-05-06 (52 y.o. 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 Wound Status: Open Wounding Event: Surgical Injury Comorbid History: Lymphedema Date Acquired: 04/03/2022 [name] Of Treatment: 17 Clustered Wound: No Photos Wound Measurements  Length: (cm) 1.1 Width: (cm) 1.1 Depth: (cm) 0.1 Area: (cm) 0.95 Volume: (cm) 0.095 % Reduction in Area: 92.9% % Reduction in Volume: 98.2% Epithelialization: Small (1-33%) Tunneling:  No Undermining: No Wound Description Classification: Full Thickness Without Exposed Support Structures Wound Margin: Distinct, outline attached Exudate Amount: Medium Exudate Type: Serosanguineous Exudate Color: red, brown Foul Odor After Cleansing: No Slough/Fibrino Yes Wound Bed Granulation Amount: Large (67-100%) Exposed Structure Granulation Quality: Red, Pink Fascia Exposed: No Necrotic Amount: Small (1-33%) Fat Layer (Subcutaneous Tissue) Exposed: Yes Necrotic Quality: Adherent Slough Tendon Exposed: No Muscle Exposed: No Joint Exposed: No Bone Exposed: No Periwound Skin Texture Texture Color No Abnormalities Noted: No No Abnormalities Noted: Yes Induration: Yes Temperature / Pain Temperature: No [address] KAMOR, [name] ([id number]) [id number] Page 6 of 6 No Abnormalities Noted: Yes Treatment Notes Wound #1 (Breast) Wound Laterality: Left Cleanser Normal Saline Discharge Instruction: Cleanse the wound with Normal Saline prior to applying a clean dressing using gauze sponges, not tissue or cotton balls. Soap and Water Discharge Instruction: May shower and wash wound with dial antibacterial soap and water prior to dressing change. [place] Ancillary Kit - 15 Day Supply Discharge Instruction: Use supplies as instructed; Kit contains: (15) Saline Bullets; (15) 3x3 Gauze; 15 pr Gloves Peri-Wound Care Topical Primary Dressing Promogran Prisma Matrix, 4.34 (sq in) (silver collagen) Discharge Instruction: Moisten collagen with saline or hydrogel Secondary Dressing Zetuvit Plus Silicone Border Dressing 5x5 (in/in) Discharge Instruction: Apply silicone border over primary dressing as directed. Secured With Compression Wrap Compression Stockings Environmental education officer) Signed: 08/26/2022 4:21:22 PM By: [name] Entered By: [name] on 08/26/2022  09:20:33 -------------------------------------------------------------------------------- Vitals Details Patient Name: Date of Service: [name], [place] W. 08/26/2022 9:15 A M Medical Record Number: [id number] Patient Account Number: [id number] Date of Birth/Sex: Treating RN: 1970-12-02 (52 y.o. [name] Primary Care Delmos Velaquez: [name] Other Clinician: Referring Brittinie Wherley: Treating Colleen Kotlarz/Extender: [name], [name] in Treatment: 17 Vital Signs Time Taken: 09:16 Temperature (F): 97.7 Height (in): 63 Pulse (bpm): 84 Weight (lbs): 170 Respiratory Rate (breaths/min): 18 Body Mass Index (BMI): 30.1 Blood Pressure (mmHg): 129/81 Reference Range: 80 - 120 mg / dl Electronic Signature(s) Signed: 08/26/2022 4:21:22 PM By: [name] Entered By: [name] on 08/26/2022 09:17:27

## 2022-08-27 NOTE — Progress Notes (Signed)
[name] ([id number]) [id number] Page 1 of 2 Visit Report for 08/26/2022 HBO Details Patient Name: Date of Service: [name], [name] 08/26/2022 10:00 A M Medical Record Number: [id number] Patient Account Number: [id number] Date of Birth/Sex: Treating RN: 12/12/1970 (53 y.o. 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 HBO Treatment Details Treatment Number: 17 Patient Type: Outpatient Chamber Type: Monoplace Chamber Serial #: [id number] Treatment Protocol: 2.0 ATA with 90 minutes oxygen, and no air breaks Treatment Details Compression Rate Down: 1.5 psi / minute De-Compression Rate Up: 1.5 psi / minute Air breaks and breathing Decompress Decompress Compress Tx Pressure Begins Reached periods Begins Ends (leave unused spaces blank) Chamber Pressure (ATA 1 2 ------2 1 ) Clock Time (24 hr) 09:37 09:50 - - - - - - 11:20 11:30 Treatment Length: 113 (minutes) Treatment Segments: 4 Vital Signs Capillary Blood Glucose Reference Range: 80 - 120 mg / dl HBO Diabetic Blood Glucose Intervention Range: <131 mg/dl or >249 mg/dl Time Vitals Blood Respiratory Capillary Blood Glucose Pulse Action Type: Pulse: Temperature: Taken: Pressure: Rate: Glucose (mg/dl): Meter #: Oximetry (%) Taken: Pre 09:16 129/81 84 18 97.7 Post 11:31 120/74 70 18 98.2 Treatment Response Treatment Toleration: Well Adverse Events: 1:Barotrauma - Ear Treatment Completion Status: Treatment Completed with Adverse Event Treatment Notes The patient had a [facility] visit before treatment today.  Compression rate was set a 1 PSI/min to 2 PSI without any complications. The compression rate was increased to 2.0 PSI/min . At 12 PSI the patient complained of discomfort to the left ear. Pressure was lowered to 10 PSI and the patient was able to clear her left ear. Compression was increased to 2 ATA at a rate of 1.2 PSI/min. No other problems noted. Dr. Celine Ahr informed. Decompression rate was set at 1.5 PSI/min to door open with any problems noted. Dr. Celine Ahr asked to come see the patient and look at her ears. Physician HBO Attestation: I certify that I supervised this HBO treatment in accordance with Medicare guidelines. A trained emergency response team is readily available per Yes hospital policies and procedures. Continue HBOT as ordered. [address] PENIEL, DONATH ([id number]) [id number] Page 2 of 2 Electronic Signature(s) Signed: 08/26/2022 2:08:26 PM By: [name] MD FACS Previous Signature: 08/26/2022 1:16:49 PM Version By: Valeria Batman EMT Entered By: [name] on 08/26/2022 14:08:26 -------------------------------------------------------------------------------- HBO Safety Checklist Details Patient Name: Date of Service: [name] 08/26/2022 10:00 A M Medical Record Number: [id number] Patient Account Number: [id number] Date of Birth/Sex: Treating RN: 05-Jul-1971 (52 y.o. [name] Girtrude Enslin: [name] Other Clinician: Donavan Burnet Referring Tristan Bramble: Treating Livvy Spilman/Extender: Shea Stakes in Treatment: 17 HBO Safety Checklist Items Safety Checklist Consent Form Signed Patient voided / foley secured and emptied When did you last eato 0830 Last dose of injectable or oral agent NA Ostomy pouch emptied and vented if applicable NA All implantable devices assessed, documented and approved NA Intravenous access site secured and place NA Valuables secured Linens and cotton  and cotton/polyester blend (less than 51% polyester) Personal oil-based products / skin lotions / body lotions removed Wigs or hairpieces removed Smoking or tobacco materials removed Books / newspapers / magazines / loose paper  removed Cologne, aftershave, perfume and deodorant removed Jewelry removed (may wrap wedding band) Make-up removed Hair care products removed Battery operated devices (external) removed Heating patches and chemical warmers removed Titanium eyewear removed NA Nail polish cured greater than 10 hours 08/26/2022 Casting material cured greater than 10 hours NA Hearing aids removed NA Loose dentures or partials removed NA Prosthetics have been removed NA Patient demonstrates correct use of air break device (if applicable) Patient concerns have been addressed Patient grounding bracelet on and cord attached to chamber Specifics for Inpatients (complete in addition to above) Medication sheet sent with patient NA Intravenous medications needed or due during therapy sent with patient NA Drainage tubes (e.g. nasogastric tube or chest tube secured and vented) NA Endotracheal or Tracheotomy tube secured NA Cuff deflated of air and inflated with saline NA Airway suctioned NA Notes The safety checklist was done before the treatment was started. Electronic Signature(s) Signed: 08/26/2022 12:57:31 PM By: Valeria Batman EMT Entered By: Valeria Batman on 08/26/2022 12:57:31

## 2022-08-27 NOTE — Progress Notes (Signed)
[name], [name] ([id number]) [id number] Page 1 of 2 Visit Report for 08/26/2022 Arrival Information Details Patient Name: Date of Service: [name], [name] 08/26/2022 10:00 A M Medical Record Number: [id number] Patient Account Number: [id number] Date of Birth/Sex: Treating RN: [birth year] (52 y.o. [name] Florean Hoobler: [name] Other Clinician: Donavan Burnet Referring Ryliegh Mcduffey: Treating Casia Corti/Extender: Shea Stakes in Treatment: 6 Visit Information History Since Last Visit All ordered tests and consults were completed: Yes Patient Arrived: Ambulatory Added or deleted any medications: No Arrival Time: 09:16 Any new allergies or adverse reactions: No Accompanied By: None Had a fall or experienced change in No Transfer Assistance: None activities of daily living that may affect Patient Identification Verified: Yes risk of falls: Secondary Verification Process Completed: Yes Signs or symptoms of abuse/neglect since last visito No Patient Requires Transmission-Based Precautions: No Hospitalized since last visit: No Patient Has Alerts: No Implantable device outside of the clinic excluding No cellular tissue based products placed in the center since last visit: Pain Present Now: No Electronic Signature(s) Signed: 08/26/2022 12:55:36 PM By: Valeria Batman EMT Entered By: Valeria Batman on 08/26/2022 12:55:35 -------------------------------------------------------------------------------- Encounter Discharge Information Details Patient Name: Date of Service: [name], [name] Bushy NIE W. 08/26/2022 10:00 A M Medical Record Number: [id number] Patient Account Number: [id number] Date of Birth/Sex: Treating RN: [birth year] (52 y.o. [name] Ben Habermann: [name] Other Clinician: Donavan Burnet Referring Brenleigh Collet: Treating Taleeya Blondin/Extender: Shea Stakes in Treatment: 40 Encounter Discharge Information Items Discharge Condition: Stable Ambulatory Status: Ambulatory Discharge Destination: Home Transportation: Private Auto Accompanied By: None Schedule Follow-up Appointment: Yes Clinical Summary of Care: Electronic Signature(s) Signed: 08/26/2022 1:18:19 PM By: Valeria Batman EMT Entered By: Valeria Batman on 08/26/2022 13:18:19 [name] ([id number]) [id number] Page 2 of 2 -------------------------------------------------------------------------------- Vitals Details Patient Name: Date of Service: [name], [name] 08/26/2022 10:00 A M Medical Record Number: [id number] Patient Account Number: [id number] Date of Birth/Sex: Treating RN: [birth year] (52 y.o. [name] Lailee Hoelzel: [name] Other Clinician: Donavan Burnet Referring Amica Harron: Treating Srihari Shellhammer/Extender: [name], [name] Weeks in Treatment: 17 Vital Signs Time Taken: 09:16 Temperature (F): 97.7 Height (in): 63 Pulse (bpm): 84 Weight (lbs): 170 Respiratory Rate (breaths/min): 18 Body Mass Index (BMI): 30.1 Blood Pressure (mmHg): 129/81 Reference Range: 80 - 120 mg / dl Electronic Signature(s) Signed: 08/26/2022 12:56:02 PM By: Valeria Batman EMT Entered By: Valeria Batman on 08/26/2022 12:56:02

## 2022-08-27 NOTE — Progress Notes (Signed)
SONAL, DEWITZ ([id number]) [id number] Page 1 of 9 Visit Report for 08/26/2022 Chief Complaint Document Details Patient Name: Date of Service: [name] RDCandie Mile. 08/26/2022 9:15 A M Medical Record Number: [id number] Patient Account Number: [id number] Date of Birth/Sex: Treating RN: [birth year] (52 y.o. F) Primary Care Provider: [name] Other Clinician: Referring Provider: Treating Provider/Extender: [name], [name] Weeks in Treatment: 17 Information Obtained from: Patient Chief Complaint 04/24/2022; patient is here for a surgical wound on her left lateral breast Electronic Signature(s) Signed: 08/26/2022 9:25:39 AM By: [name] MD FACS Entered By: [name] on 08/26/2022 09:25:39 -------------------------------------------------------------------------------- Debridement Details Patient Name: Date of Service: [name] Mattes. 08/26/2022 9:15 A M Medical Record Number: [id number] Patient Account Number: [id number] Date of Birth/Sex: Treating RN: [birth year] (52 y.o. [name] Primary Care Provider: [name] Other Clinician: Referring Provider: Treating Provider/Extender: Shea Stakes in Treatment: 17 Debridement Performed for Assessment: Wound #1 Left Breast Performed By: Physician [name], MD Debridement Type: Debridement Level of Consciousness (Pre-procedure): Awake and Alert Pre-procedure Verification/Time Out Yes - 09:22 Taken: Start Time: 09:22 Pain Control: Lidocaine 4% T opical Solution T Area Debrided (L x W): otal 1.1 (cm) x 1.1 (cm) = 1.21 (cm) Tissue and other material debrided: Non-Viable, Slough, Slough Level: Non-Viable Tissue Debridement Description: Selective/Open Wound Instrument: Curette Bleeding: Minimum Hemostasis Achieved: Pressure Response to Treatment: Procedure was tolerated well Level of Consciousness (Post- Awake and  Alert procedure): Post Debridement Measurements of Total Wound Length: (cm) 1.1 Width: (cm) 1.1 Depth: (cm) 0.1 Volume: (cm) 0.095 Character of Wound/Ulcer Post Debridement: Improved Post Procedure Diagnosis Same as Pre-procedure [name], [name] ([id number]) [id number] Page 2 of 9 Notes scribed for Dr. Celine Ahr by [name], RN Electronic Signature(s) Signed: 08/26/2022 10:33:46 AM By: [name] MD FACS Signed: 08/26/2022 4:21:22 PM By: Sabas Sous By: [name] on 08/26/2022 09:24:35 -------------------------------------------------------------------------------- HPI Details Patient Name: Date of Service: [name], [name] Bushy NIE W. 08/26/2022 9:15 A M Medical Record Number: [id number] Patient Account Number: [id number] Date of Birth/Sex: Treating RN: December 20, 1970 (52 y.o. F) Primary Care Provider: [name] Other Clinician: Referring Provider: Treating Provider/Extender: [name], [name] Weeks in Treatment: 17 History of Present Illness HPI Description: ADMISSION 05/25/2022 This is a 52 year old woman who is found to have an abnormal mammogram of her left breast earlier this year showing a 13 mm group of calcifications in the upper quadrant of the left breast. 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 She underwent a left lumpectomy on 11/12/2021. The patient states the wound never really healed and was open. She underwent radiation therapy from 12/10/2021 through 01/19/22 28 fractions of 1.8GY. Essentially the wound would not heal. She was treated several times with antibiotics finally on 03/25/2022 she was taken to the OR by Dr. Barry Dienes for excision of the wound in a sinus. Operative cultures grew strep and Prevotella and a culture from the clinic Livedo MRSA. It was recommended for 4 weeks of Augmentin and doxycycline by infectious  disease. She was seen by wound care and she has been treating the wound with Medihoney ever since. 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 She was on meth methotrexate 20 mg once  a week however this I think was put on hold because of the wound followed by Dr. Posey Pronto of rheumatology. She has asthma. Most recent breast ultrasound was on 9/7 She has a large nonhealing wound on the lateral aspect of her left breast 04/30/2022: Although I do not have a photograph to compare to last week, the RN report is that it is substantially cleaner. She still has ample slough and nonviable fat and subcutaneous tissue present. She only was able to get her Santyl on Monday so she has just had a couple of days of treatment. 05/08/2022: The wound dimensions are about the same, but the surface is substantially cleaner. There is just a little bit of slough accumulation on the surface. 05/18/2022: The wound is a little bit smaller, but not much. It is quite a bit cleaner, however. 05/25/2022: The wound surface continues to improve. Very little slough accumulation this week. No substantial change in the wound dimensions. 06/01/2022: The wound surface continues to have a layer of slough on it. There is a crack in the surface near the 12 o'clock position that when further explored, revealed a cavity and tunnel that is about 2 cm deep. No purulent drainage or concern for infection. 12/11; left breast wound. The wound itself is somewhat smaller in size per our measurements. However she has a divot at 1:00 that measures about a 1.5 cm deep. We have been using iodoform packing and if it and Santyl to the rest of the wound This wound was initially had a left lumpectomy on 11/12/2021 she  underwent 28 fractions of radiation. She potentially could benefit from hyperbaric oxygen 12/18; left breast wound. Better looking wound surface and improvement in measurements of the small tunnel. We have been using Santyl and iodoform. 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 Underneath the layer of slough, the tissue is much healthier-looking, with better color and some granulation tissue beginning to form. 07/13/2022: The wound continues to fill with better looking granulation tissue. There is very little slough accumulation and the cavity is shallower. We are going to submit for insurance approval for hyperbaric oxygen therapy, as it has now been 6 months since the time of her initial wound and radiation therapy. 07/20/2022: The cavity continues to fill in and the surface of the wound is improving. EKG and chest x-ray are complete and we are just awaiting insurance approval for hyperbarics. 07/27/2022: The cavity has filled in considerably and the surface of the wound is very clean with just a thin layer of light slough. We are still awaiting insurance approval for hyperbaric oxygen therapy. 08/03/2022: She was approved for hyperbaric oxygen therapy and received 2 treatments last week. It is rather remarkable the improvement already in her wound. The cavity continues to fill and there is epithelialization occurring around the edges of her wound. Minimal slough accumulation. 08/13/2022: Her wound is smaller and is epithelializing. There is no longer a tunnel or tract to pack. There is just slight slough accumulation on the surface. 08/19/2022: Her wound continues to contract and epithelialize. Light slough and eschar on the remaining open areas. 08/26/2022: Her wound is smaller by about half this  week. There is still some slough accumulation on the surface. AMANDINE, AUGER ([id number]) [id number] Page 3 of 9 Electronic Signature(s) Signed:  08/26/2022 9:26:12 AM By: [name] MD FACS Entered By: [name] on 08/26/2022 09:26:11 -------------------------------------------------------------------------------- Physical Exam Details Patient Name: Date of Service: [name] Mattes. 08/26/2022 9:15 A M Medical Record Number: [id number] Patient Account Number: [id number] Date of Birth/Sex: Treating RN: 12-25-1970 (52 y.o. F) Primary Care Provider: [name] Other Clinician: Referring Provider: Treating Provider/Extender: [name], [name] Weeks in Treatment: 17 Constitutional . . . . no acute distress. Respiratory Normal work of breathing on room air. Notes 08/26/2022: Her wound is smaller by about half this week. There is still some slough accumulation on the surface. Electronic Signature(s) Signed: 08/26/2022 9:26:53 AM By: [name] MD FACS Entered By: [name] on 08/26/2022 09:26:52 -------------------------------------------------------------------------------- Physician Orders Details Patient Name: Date of Service: [name] Mattes. 08/26/2022 9:15 A M Medical Record Number: [id number] Patient Account Number: [id number] Date of Birth/Sex: Treating RN: Nov 29, 1970 (52 y.o. [name] Primary Care Provider: [name] Other Clinician: Referring Provider: Treating Provider/Extender: Shea Stakes in Treatment: 9 Verbal / Phone Orders: No Diagnosis Coding ICD-10 Coding Code Description S21.002D Unspecified open wound of left breast, subsequent encounter T81.31XD Disruption of external operation (surgical) wound, not elsewhere classified, subsequent encounter 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 D05.12  Intraductal carcinoma in situ of left breast Follow-up Appointments ppointment in 1 week. - Dr. Celine Ahr Room 2 Return A Anesthetic (In clinic) Topical Lidocaine 4% applied to wound bed Bathing/ Shower/ Hygiene Other Bathing/Shower/Hygiene Orders/Instructions: - Change dressing after bathing Edema Control - Lymphedema / SCD / Other Other Edema Control Orders/Instructions: - Keep doing the Lymphadema checks [name], [name] ([id number]) [id number] Page 4 of 9 Hyperbaric Oxygen Therapy Evaluate for HBO Therapy Indication: - soft tissue radionecrosis If appropriate for treatment, begin HBOT per protocol: 2.0 ATA for 90 Minutes without A Breaks ir Total Number of Treatments: - 40 One treatments per day (delivered Monday through Friday unless otherwise specified in Special Instructions below): A frin (Oxymetazoline HCL) 0.05% nasal spray - 1 spray in both nostrils daily as needed prior to HBO treatment for difficulty clearing ears Wound Treatment Wound #1 - Breast Wound Laterality: Left Cleanser: Normal Saline (Generic) 1 x Per Day/30 Days Discharge Instructions: Cleanse the wound with Normal Saline prior to applying a clean dressing using gauze sponges, not tissue or cotton balls. Cleanser: Soap and Water 1 x Per Day/30 Days Discharge Instructions: May shower and wash wound with dial antibacterial soap and water prior to dressing change. Cleanser: Byram Ancillary Kit - 15 Day Supply (Generic) 1 x Per Day/30 Days Discharge Instructions: Use supplies as instructed; Kit contains: (15) Saline Bullets; (15) 3x3 Gauze; 15 pr Gloves Prim Dressing: Promogran Prisma Matrix, 4.34 (sq in) (silver collagen) (Dispense As Written) 1 x Per Day/30 Days ary Discharge Instructions: Moisten collagen with saline or hydrogel Secondary Dressing: Zetuvit Plus Silicone Border Dressing 5x5 (in/in) (Dispense As Written) 1 x Per Day/30 Days Discharge Instructions: Apply silicone border  over primary dressing as directed. Patient Medications llergies: kiwi, Ativan, erythromycin base, Xanax, nitrofurantoin A Notifications Medication Indication Start End 08/26/2022 lidocaine DOSE topical 4 % cream - cream topical Electronic Signature(s) Signed: 08/26/2022 10:33:46 AM By: [name] MD FACS Entered By: [name] on 08/26/2022 09:27:10 -------------------------------------------------------------------------------- Problem List Details Patient Name: Date of Service: [name] Mattes. 08/26/2022 9:15 A M Medical Record Number: [id number] Patient Account Number: [id number] Date of Birth/Sex: Treating RN: [birth year] (52 y.o. F) Primary Care  Provider: [name] Other Clinician: Referring Provider: Treating Provider/Extender: [name], [name] Weeks in Treatment: 17 Active Problems ICD-10 Encounter Code Description Active Date MDM Diagnosis S21.002D Unspecified open wound of left breast, subsequent encounter 04/24/2022 No Yes T81.31XD Disruption of external operation (surgical) wound, not elsewhere classified, 04/24/2022 No Yes subsequent encounter L59.8 Other specified disorders of the skin and subcutaneous tissue related to 04/24/2022 No Yes radiation [name], [name] ([id number]) [id number] Page 5 of 9 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 04/24/2022 No Yes Inactive Problems Resolved Problems Electronic Signature(s) Signed: 08/26/2022 9:25:19 AM By: [name] MD FACS Entered By: [name] on 08/26/2022 09:25:19 -------------------------------------------------------------------------------- Progress Note Details Patient Name: Date of Service: [name] [name] 08/26/2022 9:15 A M Medical Record Number: [id number] Patient Account Number: [id number] Date of Birth/Sex: Treating RN: [birth year] (52 y.o. F) Primary Care Provider: [name] Other Clinician: Referring  Provider: Treating Provider/Extender: [name], [name] Weeks in Treatment: 17 Subjective Chief Complaint Information obtained from Patient 04/24/2022; patient is here for a surgical wound on her left lateral breast History of Present Illness (HPI) ADMISSION 05/25/2022 This is a 52 year old woman who is found to have an abnormal mammogram of her left breast earlier this year showing a 13 mm group of calcifications in the upper quadrant of the left breast. 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 She underwent a left lumpectomy on 11/12/2021. The patient states the wound never really healed and was open. She underwent radiation therapy from 12/10/2021 through 01/19/22 28 fractions of 1.8GY. Essentially the wound would not heal. She was treated several times with antibiotics finally on 03/25/2022 she was taken to the OR by Dr. Barry Dienes for excision of the wound in a sinus. Operative cultures grew strep and Prevotella and a culture from the clinic L[place]o MRSA. It was recommended for 4 weeks of Augmentin and doxycycline by infectious disease. She was seen by wound care and she has been treating the wound with Medihoney ever since. 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 She was on meth methotrexate 20 mg once a week however this I think was put on hold because of the wound followed by Dr. Posey Pronto of rheumatology. She has asthma. Most recent breast ultrasound was on 9/7 She has a large nonhealing wound on the lateral aspect of her left breast 04/30/2022: Although I do not have a photograph to compare to last week, the RN report is that it is substantially cleaner. She still  has ample slough and nonviable fat and subcutaneous tissue present. She only was able to get her Santyl on Monday so she has just had a couple of days of treatment. 05/08/2022: The wound dimensions are about the same, but the surface is substantially cleaner. There is just a little bit of slough accumulation on the surface. 05/18/2022: The wound is a little bit smaller, but not much. It is quite a bit cleaner, however. 05/25/2022: The wound surface continues to improve. Very little slough accumulation this week. No substantial change in the wound dimensions. 06/01/2022: The wound surface continues to have a layer of slough on it. There is a crack in the surface near the 12 o'clock position that when further explored, revealed  a cavity and tunnel that is about 2 cm deep. No purulent drainage or concern for infection. 12/11; left breast wound. The wound itself is somewhat smaller in size per our measurements. However she has a divot at 1:00 that measures about a 1.5 cm deep. We have been using iodoform packing and if it and Santyl to the rest of the wound This wound was initially had a left lumpectomy on 11/12/2021 she underwent 28 fractions of radiation. She potentially could benefit from hyperbaric oxygen 12/18; left breast wound. Better looking wound surface and improvement in measurements of the small tunnel. We have been using Santyl and iodoform. 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 Underneath the layer of slough, the tissue is much healthier-looking, with better color and some granulation tissue beginning to form. 07/13/2022: The wound continues to fill with better looking granulation tissue. There is very little slough accumulation and the cavity is shallower. We are going to submit for  insurance approval for hyperbaric oxygen therapy, as it has now been 6 months since the time of her initial wound and radiation therapy. 07/20/2022: The cavity continues to fill in and the surface of the wound is improving. EKG and chest x-ray are complete and we are just awaiting insurance [name], [name] ([id number]) [id number] Page 6 of 9 approval for hyperbarics. 07/27/2022: The cavity has filled in considerably and the surface of the wound is very clean with just a thin layer of light slough. We are still awaiting insurance approval for hyperbaric oxygen therapy. 08/03/2022: She was approved for hyperbaric oxygen therapy and received 2 treatments last week. It is rather remarkable the improvement already in her wound. The cavity continues to fill and there is epithelialization occurring around the edges of her wound. Minimal slough accumulation. 08/13/2022: Her wound is smaller and is epithelializing. There is no longer a tunnel or tract to pack. There is just slight slough accumulation on the surface. 08/19/2022: Her wound continues to contract and epithelialize. Light slough and eschar on the remaining open areas. 08/26/2022: Her wound is smaller by about half this week. There is still some slough accumulation on the surface. Patient History Information obtained from Patient. Social History Never smoker, Marital Status - Married, Alcohol Use - Moderate, Drug Use - No History, Caffeine Use - Daily. Medical History Hematologic/Lymphatic Patient has history of Lymphedema - Left breast-gets Lymphadema tx Hospitalization/Surgery History - 03/25/22 Left Breast cyst excision; 11/12/21 Left Breast Lumpectomy with Radioactive seeds;Left breat Biopsy;Anterior. 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 12/08/21- 01/29/22- 37 rounds of Radiation Psychiatric General Anxiety Objective Constitutional no acute distress. Vitals Time Taken: 9:16 AM, Height: 63 in, Weight: 170 lbs, BMI: 30.1, Temperature: 97.7 F, Pulse: 84 bpm, Respiratory Rate: 18 breaths/min, Blood Pressure: 129/81 mmHg. Respiratory Normal work of breathing on room air. General Notes: 08/26/2022: Her wound is smaller by about half this week. There is still some slough accumulation on the surface. Integumentary (Hair, Skin) Wound #1 status is Open. Original cause of wound was Surgical Injury. The date acquired was: 04/03/2022. The wound has been in treatment 17 weeks. The wound is located on the Left Breast. The wound measures 1.1cm length x 1.1cm  width x 0.1cm depth; 0.95cm^2 area and 0.095cm^3 volume. There is Fat Layer (Subcutaneous Tissue) exposed. There is no tunneling or undermining noted. There is a medium amount of serosanguineous drainage noted. The wound margin is distinct with the outline attached to the wound base. There is large (67-100%) red, pink granulation within the wound bed. There is a small (1-33%) amount of necrotic tissue within the wound bed including Adherent Slough. The periwound skin appearance had no abnormalities noted for moisture. The periwound skin appearance had no abnormalities noted for color. The periwound skin appearance exhibited: Induration. Periwound temperature was noted as No Abnormality. Assessment Active Problems ICD-10 Unspecified open wound of left breast, subsequent encounter Disruption of external operation (surgical) wound, not elsewhere classified, subsequent encounter Other specified disorders of the skin and subcutaneous tissue related to radiation Intraductal carcinoma in situ of left breast [name], [name] ([id number]) [id number] Page 7 of 9 Procedures Wound #1 Pre-procedure diagnosis of Wound #1 is a Malignant Wound located on the Left  Breast . There was a Selective/Open Wound Non-Viable Tissue Debridement with a total area of 1.21 sq cm performed by [name], MD. With the following instrument(s): Curette to remove Non-Viable tissue/material. Material removed includes [facility] after achieving pain control using Lidocaine 4% Topical Solution. No specimens were taken. A time out was conducted at 09:22, prior to the start of the procedure. A Minimum amount of bleeding was controlled with Pressure. The procedure was tolerated well. Post Debridement Measurements: 1.1cm length x 1.1cm width x 0.1cm depth; 0.095cm^3 volume. Character of Wound/Ulcer Post Debridement is improved. Post procedure Diagnosis Wound #1: Same as Pre-Procedure General Notes: scribed for Dr. Celine Ahr by [name], RN. Plan Follow-up Appointments: Return Appointment in 1 week. - Dr. Celine Ahr Room 2 Anesthetic: (In clinic) Topical Lidocaine 4% applied to wound bed Bathing/ Shower/ Hygiene: Other Bathing/Shower/Hygiene Orders/Instructions: - Change dressing after bathing Edema Control - Lymphedema / SCD / Other: Other Edema Control Orders/Instructions: - Keep doing the Lymphadema checks Hyperbaric Oxygen Therapy: Evaluate for HBO Therapy Indication: - soft tissue radionecrosis If appropriate for treatment, begin HBOT per protocol: 2.0 ATA for 90 Minutes without Air Breaks T Number of Treatments: - 40 otal One treatments per day (delivered Monday through Friday unless otherwise specified in Special Instructions below): Afrin (Oxymetazoline HCL) 0.05% nasal spray - 1 spray in both nostrils daily as needed prior to HBO treatment for difficulty clearing ears The following medication(s) was prescribed: lidocaine topical 4 % cream cream topical was prescribed at facility WOUND #1: - Breast Wound Laterality: Left Cleanser: Normal Saline (Generic) 1 x Per Day/30 Days Discharge Instructions: Cleanse the wound with Normal Saline prior to applying a clean  dressing using gauze sponges, not tissue or cotton balls. Cleanser: Soap and Water 1 x Per Day/30 Days Discharge Instructions: May shower and wash wound with dial antibacterial soap and water prior to dressing change. Cleanser: Byram Ancillary Kit - 15 Day Supply (Generic) 1 x Per Day/30 Days Discharge Instructions: Use supplies as instructed; Kit contains: (15) Saline Bullets; (15) 3x3 Gauze; 15 pr Gloves Prim Dressing: Promogran Prisma Matrix, 4.34 (sq in) (silver collagen) (Dispense As Written) 1 x Per Day/30 Days ary Discharge Instructions: Moisten collagen with saline or hydrogel Secondary Dressing: Zetuvit Plus Silicone Border Dressing 5x5 (in/in) (Dispense As Written) 1 x Per Day/30 Days Discharge Instructions: Apply silicone border over primary dressing as directed. 08/26/2022: Her wound is smaller by about half this week. There is still some slough accumulation on the surface. I used  a curette to debride the slough off of the wound surface. We will continue Prisma silver collagen. She will continue hyperbaric oxygen therapy. If she continues to heal at her current rate, I anticipate she will likely be healed in the next 2 to 3 weeks. Electronic Signature(s) Signed: 08/26/2022 9:27:41 AM By: [name] MD FACS Entered By: [name] on 08/26/2022 09:27:40 -------------------------------------------------------------------------------- HxROS Details Patient Name: Date of Service: [name], [place] W. 08/26/2022 9:15 A M Medical Record Number: [id number] Patient Account Number: [id number] Date of Birth/Sex: Treating RN: 05/12/1971 (51 y.o. F) Primary Care Provider: [name] Other Clinician: Referring Provider: Treating Provider/Extender: Shea Stakes in Treatment: [address], [name] ([id number]) [id number] Page 8 of 9 Information Obtained From Patient Ear/Nose/Mouth/Throat Medical History: Past Medical History  Notes: Left ear limited hearing Hematologic/Lymphatic Medical History: Positive for: Lymphedema - Left breast-gets Lymphadema tx Gastrointestinal Medical History: Past Medical History Notes: GERD Immunological Medical History: Past Medical History Notes: Wegener"s Granulomatosis Musculoskeletal Medical History: Past Medical History Notes: "RA like arthritis" as per patient Oncologic Medical History: Past Medical History Notes: Left Breast. 12/08/21- 01/29/22- 37 rounds of Radiation Psychiatric Medical History: Past Medical History Notes: General Anxiety Immunizations Pneumococcal Vaccine: Received Pneumococcal Vaccination: Yes Received Pneumococcal Vaccination On or After 60th Birthday: No Implantable Devices Yes Hospitalization / Surgery History Type of Hospitalization/Surgery 03/25/22 Left Breast cyst excision; 11/12/21 Left Breast Lumpectomy with Radioactive seeds;Left breat Biopsy;Anterior Family and Social History Never smoker; Marital Status - Married; Alcohol Use: Moderate; Drug Use: No History; Caffeine Use: Daily; Financial Concerns: No; Food, Clothing or Shelter Needs: No; Support System Lacking: No; Transportation Concerns: No Electronic Signature(s) Signed: 08/26/2022 10:33:46 AM By: [name] MD FACS Entered By: [name] on 08/26/2022 09:26:18 -------------------------------------------------------------------------------- SuperBill Details Patient Name: Date of Service: [name] Mattes. 08/26/2022 Medical Record Number: [id number] Patient Account Number: [id number] [name], [name] ([id number]) [id number] Page 9 of 9 Date of Birth/Sex: Treating RN: [birth year] (52 y.o. 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 Facility Procedures : CPT4 Code: [id number] Description: [id number] - DEBRIDE WOUND 1ST 20 SQ CM OR < ICD-10 Diagnosis Description S21.002D Unspecified open wound of left breast, subsequent encounter Modifier: Quantity: 1 Physician Procedures : CPT4 Code Description Modifier [id number] - WC PHYS LEVEL 3 - EST PT 25 ICD-10 Diagnosis Description S21.002D Unspecified open wound of left breast, subsequent encounter T81.31XD Disruption of external operation (surgical) wound, not elsewhere  classified, subsequent encounter 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

## 2022-08-27 NOTE — Progress Notes (Signed)
SARAHBETH, ORLOSKY ([id number]) [id number] Page 1 of 2 Visit Report for 08/26/2022 Problem List Details Patient Name: Date of Service: [name], [name] 08/26/2022 10:00 A M Medical Record Number: [id number] Patient Account Number: [id number] Date of Birth/Sex: Treating RN: March 13, 1971 (52 y.o. 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 08/26/2022 Medical Record Number: [id number] Patient Account Number: [id number] Date of Birth/Sex: Treating RN: April 04, 1971 (52 y.o. 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 Facility Procedures : CPT4 Code Description: [id number]-(Facility Use Only) HBOT full body chamber, 68mn , ICD-10 Diagnosis Description L59.8 Other specified disorders of the skin and subcutaneous tissue related to rad S21.002D Unspecified open wound of left breast,  subsequent encounter T81.31XD Disruption of external operation (surgical) wound, not elsewhere classified, Modifier: iation subsequent encou Quantity: 4 nter Physician Procedures : CPT4 Code Description Modifier [id number] PHYS HYPERBARIC OXYGEN THERAPY ICD-10 Diagnosis Description 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 S21.002D Unspecified open wound of left breast,  subsequent encounter T81.31XD Disruption of external operation (surgical) wound, not elsewhere classified, subsequent enco Quantity: 1 unter Electronic Signature(s) Signed: 08/26/2022 1:17:14 PM By: GValeria BatmanEMT Signed: 08/26/2022 2:07:52 PM By: CFredirick MaudlinMD FACS Entered By: GValeria Batmanon 08/26/2022 13:17:13

## 2022-08-28 ENCOUNTER — Encounter (HOSPITAL_BASED_OUTPATIENT_CLINIC_OR_DEPARTMENT_OTHER): Payer: 59 | Admitting: General Surgery

## 2022-08-28 NOTE — Progress Notes (Signed)
SALEEN, ATMORE ([id number]) [id number] Page 1 of 2 Visit Report for 08/27/2022 Arrival Information Details Patient Name: Date of Service: [name], [name] 08/27/2022 8:00 A M Medical Record Number: [id number] Patient Account Number: [id number] Date of Birth/Sex: Treating RN: [birth year] (52 y.o. [name] Primary Care Cheryl Chay: [name] Other Clinician: Donavan Burnet Referring Delvin Hedeen: Treating Kateland Leisinger/Extender: Shea Stakes in Treatment: 70 Visit Information History Since Last Visit All ordered tests and consults were completed: Yes Patient Arrived: Ambulatory Added or deleted any medications: No Arrival Time: 07:50 Any new allergies or adverse reactions: No Accompanied By: self Had a fall or experienced change in No Transfer Assistance: None activities of daily living that may affect Patient Identification Verified: Yes risk of falls: Secondary Verification Process Completed: Yes Signs or symptoms of abuse/neglect since last visito No Patient Requires Transmission-Based Precautions: No Hospitalized since last visit: No Patient Has Alerts: No Implantable device outside of the clinic excluding No cellular tissue based products placed in the center since last visit: Pain Present Now: No Electronic Signature(s) Signed: 08/27/2022 12:58:41 PM By: Donavan Burnet CHT EMT BS , , Entered By: Donavan Burnet on 08/27/2022 12:58:41 -------------------------------------------------------------------------------- Encounter Discharge Information Details Patient Name: Date of Service: [name], [name] 08/27/2022 8:00 A M Medical Record Number: [id number] Patient Account Number: [id number] Date of Birth/Sex: Treating RN: [birth year] (52 y.o. [name] Primary Care Lakoda Raske: [name] Other Clinician: Donavan Burnet Referring Taisa Deloria: Treating Malaysha Arlen/Extender: Shea Stakes in Treatment: 57 Encounter Discharge Information Items Discharge Condition: Stable Ambulatory Status: Ambulatory Discharge Destination: Home Transportation: Private Auto Accompanied By: self Schedule Follow-up Appointment: No Clinical Summary of Care: Electronic Signature(s) Signed: 08/27/2022 1:22:36 PM By: Donavan Burnet CHT EMT BS , , Entered By: Donavan Burnet on 08/27/2022 13:22:36 [name] ([id number]) [id number] Page 2 of 2 -------------------------------------------------------------------------------- Vitals Details Patient Name: Date of Service: [name], [name] 08/27/2022 8:00 A M Medical Record Number: [id number] Patient Account Number: [id number] Date of Birth/Sex: Treating RN: 03-08-1971 (52 y.o. [name] Primary Care Zandra Lajeunesse: [name] Other Clinician: Donavan Burnet Referring Amerigo Mcglory: Treating Patrice Moates/Extender: Shea Stakes in Treatment: 17 Vital Signs Time Taken: 08:02 Temperature (F): 96.8 Height (in): 63 Pulse (bpm): 78 Weight (lbs): 170 Respiratory Rate (breaths/min): 18 Body Mass Index (BMI): 30.1 Blood Pressure (mmHg): 127/76 Reference Range: 80 - 120 mg / dl Electronic Signature(s) Signed: 08/27/2022 1:01:00 PM By: Donavan Burnet CHT EMT BS , , Entered By: Donavan Burnet on 08/27/2022 13:01:00

## 2022-08-28 NOTE — Progress Notes (Signed)
WATEEN, LEMELIN ([id number]) [id number] Page 1 of 1 Visit Report for 08/27/2022 SuperBill Details Patient Name: Date of Service: [name] RDCandie Mile 08/27/2022 Medical Record Number: [id number] Patient Account Number: [id number] Date of Birth/Sex: Treating RN: [birth year] (52 y.o. 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 Facility Procedures CPT4 Code Description Modifier Quantity [id number]-(Facility Use Only) HBOT full body chamber, 53mn , 2 ICD-10 Diagnosis Description 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 S21.002D Unspecified open wound of left breast, subsequent encounter T81.31XD Disruption of external operation (surgical) wound, not elsewhere classified, subsequent encounter 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 Physician Procedures Quantity CPT4 Code Description Modifier [id number] PHYS HYPERBARIC OXYGEN THERAPY 1 ICD-10 Diagnosis Description 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 S21.002D Unspecified open wound of left breast, subsequent encounter T81.31XD Disruption of external operation (surgical) wound, not elsewhere classified, subsequent encounter 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

## 2022-08-28 NOTE — Progress Notes (Signed)
KIBIBI, EISEN ([id number]) [phone].pdf Page 1 of 3 Visit Report for 08/27/2022 HBO Details Patient Name: Date of Service: [name], [name] 08/27/2022 8:00 A M Medical Record Number: [id number] Patient Account Number: [id number] Date of Birth/Sex: Treating RN: 03/04/1971 (52 y.o. 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 HBO Treatment Details Treatment Number: 18 Patient Type: Outpatient Chamber Type: Monoplace Chamber Serial #: [id number] Treatment Protocol: 2.0 ATA with 90 minutes oxygen, and no air breaks Treatment Details Compression Rate Down: 1.0 psi / minute De-Compression Rate Up: 1.0 psi / minute Air breaks and breathing Decompress Decompress Compress Tx Pressure Begins Reached periods Begins Ends (leave unused spaces blank) Chamber Pressure (ATA 1 2 ------2 1 ) Clock Time (24 hr) 08:04 - - - - - - - 08:40 08:51 Treatment Length: 47 (minutes) Treatment Segments: 2 Vital Signs Capillary Blood Glucose Reference Range: 80 - 120 mg / dl HBO Diabetic Blood Glucose Intervention Range: <131 mg/dl or >249 mg/dl Type: Time Vitals Blood Respiratory Capillary Blood Glucose Pulse Action Pulse: Temperature: Taken: Pressure: Rate: Glucose (mg/dl): Meter #: Oximetry (%) Taken: Pre 08:02 127/76 78 18 96.8 none per protocol Post 08:55 113/74 80 20 98.1 none per protocol Treatment Response Treatment Toleration: Poor Adverse Events: 1:Barotrauma - Ear Treatment Completion Status: Treatment Aborted/Not Restarted Reason: Adverse Event Treatment Notes Patient arrived, prepared for  treatment. After performing safety check, patient was placed in the chamber. Chamber was compressed with 100% oxygen at a rate of 1 psi/min. Upon reaching 5 psig, patient complained that her left ear was not equalizing and some pain. Set pressure was reduced back to 2 psig. When chamber pressure reached 3 psig patient stated that the ear had cleared. I told her that I would reduce a little more and change the air flow to decrease the travel speed. I also clarified that as soon as the ear doesn't equalize that she should get our attention to stop travel. This decreased travel rate helped until chamber reached 10 psig and patient complained that the left ear was not equalizing and had some slight pain again. Pressure was reduced again to 7 psig. Patient stated that the ear equalized and travel continued. Upon nearly reaching treatment depth of 2ATA/14 psig patient stated that ear was not clearing again. (MSCAMMELL) I was informed of the above by the HBO check. We elected to abort her treatment today. She has complained of some congestion the past couple of days. I performed otoscopic examination of both tympanic membranes. There is no fluid behind either of them, but she had T grade 1 barotrauma to the left eed tympanic membrane. She is going to try an over-the-counter decongestant, such as Zyrtec-D, before coming for treatment tomorrow. I am reluctant to refer her for [facility] ostomy tubes at this point, given how well her wound is healing and the fact that it may be closed within the next week or so. [facility]) Post Treatment Teed Score [name], [name] ([id number]) [id number] Page 2 of 3 Post Treatment T Score: Left Ear eed Grade I Physician HBO Attestation: I certify that I supervised this HBO treatment in accordance with Medicare guidelines. A trained emergency response team is readily available per Yes hospital policies and procedures. Continue  HBOT as ordered.  Yes Electronic Signature(s) Signed: 08/27/2022 3:29:32 PM By: [name] MD FACS Previous Signature: 08/27/2022 1:20:26 PM Version By: Donavan Burnet CHT EMT BS , , Entered By: [name] on 08/27/2022 15:29:32 -------------------------------------------------------------------------------- HBO Safety Checklist Details Patient Name: Date of Service: [name], [name] W. 08/27/2022 8:00 A M Medical Record Number: [id number] Patient Account Number: [id number] Date of Birth/Sex: Treating RN: 19-Oct-1970 (52 y.o. [name] Primary Care Morelia Cassells: [name] Other Clinician: Donavan Burnet Referring Dymir Neeson: Treating Asako Saliba/Extender: Shea Stakes in Treatment: 17 HBO Safety Checklist Items Safety Checklist Consent Form Signed Patient voided / foley secured and emptied When did you last eato 0730 Last dose of injectable or oral agent n/a Ostomy pouch emptied and vented if applicable NA All implantable devices assessed, documented and approved NA Intravenous access site secured and place NA Valuables secured Linens and cotton and cotton/polyester blend (less than 51% polyester) Personal oil-based products / skin lotions / body lotions removed Wigs or hairpieces removed NA Smoking or tobacco materials removed NA Books / newspapers / magazines / loose paper removed Cologne, aftershave, perfume and deodorant removed Jewelry removed (may wrap wedding band) Make-up removed Hair care products removed Battery operated devices (external) removed Heating patches and chemical warmers removed Titanium eyewear removed Nail polish cured greater than 10 hours Casting material cured greater than 10 hours NA Hearing aids removed NA Loose dentures or partials removed NA Prosthetics have been removed NA Patient demonstrates correct use of air break device (if applicable) Patient concerns have been addressed Patient grounding bracelet  on and cord attached to chamber Specifics for Inpatients (complete in addition to above) Medication sheet sent with patient NA Intravenous medications needed or due during therapy sent with patient NA Drainage tubes (e.g. nasogastric tube or chest tube secured and vented) NA Endotracheal or Tracheotomy tube secured NA Cuff deflated of air and inflated with saline NA Airway suctioned NA Notes [name], [name] ([id number]) [phone].pdf Page 3 of 3 Paper version used prior to treatment start. Electronic Signature(s) Signed: 08/27/2022 1:05:33 PM By: Donavan Burnet CHT EMT BS , , Entered By: Donavan Burnet on 08/27/2022 13:05:33

## 2022-08-31 ENCOUNTER — Encounter (HOSPITAL_BASED_OUTPATIENT_CLINIC_OR_DEPARTMENT_OTHER): Payer: 59 | Admitting: General Surgery

## 2022-08-31 DIAGNOSIS — T8131XD Disruption of external operation (surgical) wound, not elsewhere classified, subsequent encounter: Secondary | ICD-10-CM | POA: Diagnosis not present

## 2022-09-01 ENCOUNTER — Encounter (HOSPITAL_BASED_OUTPATIENT_CLINIC_OR_DEPARTMENT_OTHER): Payer: 59 | Admitting: General Surgery

## 2022-09-01 DIAGNOSIS — T8131XD Disruption of external operation (surgical) wound, not elsewhere classified, subsequent encounter: Secondary | ICD-10-CM | POA: Diagnosis not present

## 2022-09-01 NOTE — Progress Notes (Signed)
KAYLEANA, [name] ([id number]) [id number] Page 1 of 1 Visit Report for 08/31/2022 SuperBill Details Patient Name: Date of Service: [name] 08/31/2022 Medical Record Number: [id number] Patient Account Number: [id number] Date of Birth/Sex: Treating RN: 12/04/1970 (52 y.o. 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 Facility Procedures CPT4 Code Description Modifier Quantity [id number] G0277-(Facility Use Only) HBOT full body chamber, 78mn , 4 ICD-10 Diagnosis Description 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 S21.002D Unspecified open wound of left breast, subsequent encounter T81.31XD Disruption of external operation (surgical) wound, not elsewhere classified, subsequent encounter 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 Physician Procedures Quantity CPT4 Code Description Modifier [id number] PHYS HYPERBARIC OXYGEN THERAPY 1 ICD-10 Diagnosis Description 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 S21.002D Unspecified open wound of left breast, subsequent encounter T81.31XD Disruption of external operation (surgical) wound, not elsewhere classified, subsequent encounter 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

## 2022-09-01 NOTE — Progress Notes (Signed)
EMILLEE, ELBERT ([id number]) [phone].pdf Page 1 of 2 Visit Report for 08/31/2022 Arrival Information Details Patient Name: Date of Service: [name], [name] 08/31/2022 8:00 A M Medical Record Number: [id number] Patient Account Number: [id number] Date of Birth/Sex: Treating RN: 1971/04/20 (52 y.o. [name] Primary Care Jennye Runquist: [name] Other Clinician: Donavan Burnet Referring Chinonso Linker: Treating Floris Neuhaus/Extender: Shea Stakes in Treatment: 28 Visit Information History Since Last Visit All ordered tests and consults were completed: Yes Patient Arrived: Ambulatory Added or deleted any medications: No Arrival Time: 07:30 Any new allergies or adverse reactions: No Accompanied By: self Had a fall or experienced change in No Transfer Assistance: None activities of daily living that may affect Patient Identification Verified: Yes risk of falls: Secondary Verification Process Completed: Yes Signs or symptoms of abuse/neglect since last visito No Patient Requires Transmission-Based Precautions: No Hospitalized since last visit: No Patient Has Alerts: No Implantable device outside of the clinic excluding No cellular tissue based products placed in the center since last visit: Pain Present Now: No Electronic Signature(s) Signed: 08/31/2022 2:52:25 PM By: Donavan Burnet CHT EMT BS , , Entered By: Donavan Burnet on 08/31/2022 14:52:25 -------------------------------------------------------------------------------- Encounter Discharge Information Details Patient Name: Date of Service: [name], [name] Bushy NIE W. 08/31/2022 8:00 A M Medical Record Number: [id number] Patient Account Number: [id number] Date of Birth/Sex: Treating RN: 12-Apr-1971 (52 y.o. [name] Primary Care Nickisha Hum: [name] Other Clinician: Donavan Burnet Referring Yavier Snider: Treating Elice Crigger/Extender: Shea Stakes in Treatment: 38 Encounter Discharge Information Items Discharge Condition: Stable Ambulatory Status: Ambulatory Discharge Destination: Home Transportation: Private Auto Accompanied By: self Schedule Follow-up Appointment: No Clinical Summary of Care: Electronic Signature(s) Signed: 08/31/2022 3:04:36 PM By: Donavan Burnet CHT EMT BS , , Entered By: Donavan Burnet on 08/31/2022 15:04:36 -------------------------------------------------------------------------------- Vitals Details Patient Name: Date of Service: [name], [name] Bushy NIE W. 08/31/2022 8:00 A M Medical Record Number: [id number] Patient Account Number: [id number] Date of Birth/Sex: Treating RN: 21-Jun-1971 (52 y.o. [name] Primary Care Viktoria Gruetzmacher: [name] Other Clinician: Donavan Burnet Referring Hermina Barnard: Treating Duong Haydel/Extender: [name], [name] Weeks in Treatment: 18 Vital Signs Time Taken: 07:50 Temperature (F): 96.8 PATZY, FLOURNOY ([id number]) [phone].pdf Page 2 of 2 Height (in): 63 Pulse (bpm): 85 Weight (lbs): 170 Respiratory Rate (breaths/min): 20 Body Mass Index (BMI): 30.1 Blood Pressure (mmHg): 123/75 Reference Range: 80 - 120 mg / dl Electronic Signature(s) Signed: 08/31/2022 2:52:47 PM By: Donavan Burnet CHT EMT BS , , Entered By: Donavan Burnet on 08/31/2022 14:52:47

## 2022-09-01 NOTE — Progress Notes (Signed)
[name], [name] ([id number]) [id number] Page 1 of 2 Visit Report for 08/31/2022 HBO Details Patient Name: Date of Service: [name], [name] 08/31/2022 8:00 A M Medical Record Number: [id number] Patient Account Number: [id number] Date of Birth/Sex: Treating RN: 1971-01-20 (52 y.o. 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 HBO Treatment Details Treatment Number: 19 Patient Type: Outpatient Chamber Type: Monoplace Chamber Serial #: [id number] Treatment Protocol: 2.0 ATA with 90 minutes oxygen, and no air breaks Treatment Details Compression Rate Down: 1.5 psi / minute De-Compression Rate Up: 2.0 psi / minute Air breaks and breathing Decompress Decompress Compress Tx Pressure Begins Reached periods Begins Ends (leave unused spaces blank) Chamber Pressure (ATA 1 2 ------2 1 ) Clock Time (24 hr) 07:55 08:04 - - - - - - 09:34 09:46 Treatment Length: 111 (minutes) Treatment Segments: 4 Vital Signs Capillary Blood Glucose Reference Range: 80 - 120 mg / dl HBO Diabetic Blood Glucose Intervention Range: <131 mg/dl or >249 mg/dl Type: Time Vitals Blood Respiratory Capillary Blood Glucose Pulse Action Pulse: Temperature: Taken: Pressure: Rate: Glucose (mg/dl): Meter #: Oximetry (%) Taken: Pre 07:50 123/75 85 20 96.8 none per protocol Post 10:02 113/73 85 18 98.2 none per protocol Treatment Response Treatment Toleration: Well Treatment Completion Status: Treatment Completed without Adverse Event Physician HBO Attestation: I certify that I supervised this HBO treatment in accordance with  Medicare guidelines. A trained emergency response team is readily available per Yes hospital policies and procedures. Continue HBOT as ordered. Yes Electronic Signature(s) Signed: 08/31/2022 5:10:44 PM By: [name] Maudlin MD FACS Previous Signature: 08/31/2022 3:03:29 PM Version By: [name] CHT EMT BS , , Entered By: [name] on 08/31/2022 17:10:44 -------------------------------------------------------------------------------- HBO Safety Checklist Details Patient Name: Date of Service: [name], [name] 08/31/2022 8:00 A M Medical Record Number: [id number] Patient Account Number: [id number] Date of Birth/Sex: Treating RN: [birth year] (52 y.o. [name] Primary Care [name]: [name] Other Clinician: Donavan [name] Referring [name]: Treating [name]/Extender: [name] [place], [name] ([id number]) [id number] Page 2 of 2 Weeks in Treatment: 18 HBO Safety Checklist Items Safety Checklist Consent Form Signed Patient voided / foley secured and emptied When did you last eato 0720 Last dose of injectable or oral agent n/a Ostomy pouch emptied and vented if applicable NA All implantable devices assessed, documented and approved NA Intravenous access site secured and place NA Valuables secured Linens and cotton and cotton/polyester blend (less than 51% polyester) Personal oil-based products / skin lotions / body lotions removed Wigs or hairpieces removed NA Smoking or tobacco materials removed NA Books / newspapers / magazines / loose paper removed Cologne, aftershave, perfume and deodorant removed Jewelry removed (may wrap wedding band) Make-up removed Hair care products removed Battery operated devices (external) removed Heating patches and chemical warmers removed Titanium eyewear removed Nail polish cured greater than 10 hours Casting material cured greater than 10 hours NA Hearing  aids removed NA Loose dentures or partials removed NA Prosthetics have been removed NA Patient demonstrates correct use of air break device (if applicable) Patient concerns have been addressed Patient grounding bracelet on and cord attached to chamber Specifics for Inpatients (complete in addition to above) Medication sheet sent with patient NA Intravenous  medications needed or due during therapy sent with patient NA Drainage tubes (e.g. nasogastric tube or chest tube secured and vented) NA Endotracheal or Tracheotomy tube secured NA Cuff deflated of air and inflated with saline NA Airway suctioned NA Notes Paper version used prior to treatment start. Electronic Signature(s) Signed: 08/31/2022 2:55:01 PM By: [name] CHT EMT BS , , Previous Signature: 08/31/2022 2:54:11 PM Version By: [name] CHT EMT BS , , Entered By: [name] on 08/31/2022 14:55:01

## 2022-09-02 ENCOUNTER — Encounter (HOSPITAL_BASED_OUTPATIENT_CLINIC_OR_DEPARTMENT_OTHER): Payer: 59 | Admitting: General Surgery

## 2022-09-02 DIAGNOSIS — T8131XD Disruption of external operation (surgical) wound, not elsewhere classified, subsequent encounter: Secondary | ICD-10-CM | POA: Diagnosis not present

## 2022-09-02 NOTE — Progress Notes (Signed)
[name], VALLEROY ([id number]) [id number] Page 1 of 2 Visit Report for 09/01/2022 HBO Details Patient Name: Date of Service: [name], [name] 09/01/2022 8:00 A M Medical Record Number: [id number] Patient Account Number: [id number] Date of Birth/Sex: Treating RN: 23-Aug-1970 (52 y.o. 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 HBO Treatment Details Treatment Number: 20 Patient Type: Outpatient Chamber Type: Monoplace Chamber Serial #: [id number] Treatment Protocol: 2.0 ATA with 90 minutes oxygen, and no air breaks Treatment Details Compression Rate Down: 1.0 psi / minute De-Compression Rate Up: 1.0 psi / minute Air breaks and breathing Decompress Decompress Compress Tx Pressure Begins Reached periods Begins Ends (leave unused spaces blank) Chamber Pressure (ATA 1 2 ------2 1 ) Clock Time (24 hr) 08:02 08:16 - - - - - - 09:46 10:01 Treatment Length: 119 (minutes) Treatment Segments: 4 Vital Signs Capillary Blood Glucose Reference Range: 80 - 120 mg / dl HBO Diabetic Blood Glucose Intervention Range: <131 mg/dl or >249 mg/dl Type: Time Vitals Blood Respiratory Capillary Blood Glucose Pulse Action Pulse: Temperature: Taken: Pressure: Rate: Glucose (mg/dl): Meter #: Oximetry (%) Taken: Pre 08:01 135/88 80 20 97.7 none per protocol Post 10:07 105/89 74 18 97.6 none per protocol Treatment Response Treatment Toleration: Well Treatment Completion Status: Treatment Completed without Adverse Event Treatment Notes Patient arrived and prepared for treatment. Vital signs were taken chamber-side  which were normal. After performing safety check, patient was placed in the chamber travelling at 1 psi/min until reaching treatment depth of 2 ATA. Mrs. Kobza tolerated treatment and decompression of the chamber at the same 1 psi/min. Patient was stable upon discharge. Physician HBO Attestation: I certify that I supervised this HBO treatment in accordance with Medicare guidelines. A trained emergency response team is readily available per Yes hospital policies and procedures. Continue HBOT as ordered. Yes Electronic Signature(s) Signed: 09/01/2022 1:41:40 PM By: [name] MD FACS Previous Signature: 09/01/2022 11:38:20 AM Version By: Donavan Burnet CHT EMT BS , , Previous Signature: 09/01/2022 9:25:38 AM Version By: Donavan Burnet CHT EMT BS , , Previous Signature: 09/01/2022 9:07:00 AM Version By: Donavan Burnet CHT EMT BS , , Entered By: [name] on 09/01/2022 13:41:40 [name] ([id number].pdf Page 2 of 2 -------------------------------------------------------------------------------- HBO Safety Checklist Details Patient Name: Date of Service: [name], [name] 09/01/2022 8:00 A M Medical Record Number: [id number] Patient Account Number: [id number] Date of Birth/Sex: Treating RN: 1971/07/05 (52 y.o. [name], Meta.Reding Primary Care Sondi Desch: [name] Other Clinician: Donavan Burnet Referring Rachell Druckenmiller: Treating Haroldine Redler/Extender: [name], [name] Weeks in Treatment: 18 HBO Safety Checklist Items Safety Checklist Consent Form Signed Patient voided / foley secured and emptied When did you last eato 0730 Last dose of injectable or oral agent n/a Ostomy pouch emptied and vented if applicable NA All implantable devices assessed, documented and approved NA Intravenous access site secured and place NA Valuables secured Linens and cotton and cotton/polyester blend (less than 51% polyester) Personal  oil-based products / skin lotions / body lotions removed Wigs or hairpieces removed NA Smoking or tobacco materials removed NA Books / newspapers / magazines / loose paper removed Cologne, aftershave, perfume and deodorant removed Jewelry removed (may wrap wedding band) Make-up removed Hair care products  removed Battery operated devices (external) removed Heating patches and chemical warmers removed Titanium eyewear removed Nail polish cured greater than 10 hours greater than 10 hours Casting material cured greater than 10 hours NA Hearing aids removed NA Loose dentures or partials removed NA Prosthetics have been removed NA Patient demonstrates correct use of air break device (if applicable) Patient concerns have been addressed Patient grounding bracelet on and cord attached to chamber Specifics for Inpatients (complete in addition to above) Medication sheet sent with patient NA Intravenous medications needed or due during therapy sent with patient NA Drainage tubes (e.g. nasogastric tube or chest tube secured and vented) NA Endotracheal or Tracheotomy tube secured NA Cuff deflated of air and inflated with saline NA Airway suctioned NA Notes Paper version used prior to treatment start. Electronic Signature(s) Signed: 09/01/2022 9:06:36 AM By: Donavan Burnet CHT EMT BS , , Entered By: Donavan Burnet on 09/01/2022 09:06:35

## 2022-09-02 NOTE — Progress Notes (Signed)
JHOURNI, CARLE ([id number]) [phone].pdf Page 1 of 1 Visit Report for 09/01/2022 SuperBill Details Patient Name: Date of Service: [name] 09/01/2022 Medical Record Number: [id number] Patient Account Number: [id number] Date of Birth/Sex: Treating RN: Mar 27, 1971 (52 y.o. 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 Facility Procedures CPT4 Code Description Modifier Quantity [id number]-(Facility Use Only) HBOT full body chamber, 47mn , 4 ICD-10 Diagnosis Description 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 S21.002D Unspecified open wound of left breast, subsequent encounter T81.31XD Disruption of external operation (surgical) wound, not elsewhere classified, subsequent encounter 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 Physician Procedures Quantity CPT4 Code Description Modifier [id number] PHYS HYPERBARIC OXYGEN THERAPY 1 ICD-10 Diagnosis Description 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

## 2022-09-02 NOTE — Progress Notes (Signed)
MERIAL, AFTAB ([id number]) [phone].pdf Page 1 of 2 Visit Report for 09/01/2022 Arrival Information Details Patient Name: Date of Service: [name], [name] 09/01/2022 8:00 A M Medical Record Number: [id number] Patient Account Number: [id number] Date of Birth/Sex: Treating RN: 1971/01/22 (52 y.o. [name], Meta.Reding Primary Care Kellyann Ordway: [name] Other Clinician: Donavan Burnet Referring Subrina Vecchiarelli: Treating Caetano Oberhaus/Extender: Shea Stakes in Treatment: 37 Visit Information History Since Last Visit All ordered tests and consults were completed: Yes Patient Arrived: Ambulatory Added or deleted any medications: No Arrival Time: 07:50 Any new allergies or adverse reactions: No Accompanied By: self Had a fall or experienced change in No Transfer Assistance: None activities of daily living that may affect Patient Identification Verified: Yes risk of falls: Secondary Verification Process Completed: Yes Signs or symptoms of abuse/neglect since last visito No Patient Requires Transmission-Based Precautions: No Hospitalized since last visit: No Patient Has Alerts: No Implantable device outside of the clinic excluding No cellular tissue based products placed in the center since last visit: Pain Present Now: No Electronic Signature(s) Signed: 09/01/2022 9:04:40 AM By: Donavan Burnet CHT EMT BS , , Entered By: Donavan Burnet on 09/01/2022 09:04:39 -------------------------------------------------------------------------------- Encounter Discharge Information Details Patient Name: Date of Service: [name], [name] NIE W. 09/01/2022 8:00 A M Medical Record Number: [id number] Patient Account Number: [id number] Date of Birth/Sex: Treating RN: 06-27-1971 (52 y.o. [name] Primary Care Lakeesha Fontanilla: [name] Other Clinician: Donavan Burnet Referring Marcha Licklider: Treating Kemiah Booz/Extender: Shea Stakes in Treatment: 52 Encounter Discharge Information Items Discharge Condition: Stable Ambulatory Status: Ambulatory Discharge Destination: Home Transportation: Private Auto Accompanied By: self Schedule Follow-up Appointment: No Clinical Summary of Care: Electronic Signature(s) Signed: 09/01/2022 11:39:44 AM By: Donavan Burnet CHT EMT BS , , Entered By: Donavan Burnet on 09/01/2022 11:39:44 -------------------------------------------------------------------------------- Vitals Details Patient Name: Date of Service: [name], [name] NIE W. 09/01/2022 8:00 A M Medical Record Number: [id number] Patient Account Number: [id number] Date of Birth/Sex: Treating RN: 1971/04/28 (52 y.o. [name] Primary Care Vincenza Dail: [name] Other Clinician: Donavan Burnet Referring Tarence Searcy: Treating Diandre Merica/Extender: [name], [name] Weeks in Treatment: 18 Vital Signs Time Taken: 08:01 Temperature (F): 97.7 JALYN, PEET ([id number]) [id number] Page 2 of 2 Height (in): 63 Pulse (bpm): 80 Weight (lbs): 170 Respiratory Rate (breaths/min): 20 Body Mass Index (BMI): 30.1 Blood Pressure (mmHg): 135/88 Reference Range: 80 - 120 mg / dl Electronic Signature(s) Signed: 09/01/2022 9:05:00 AM By: Donavan Burnet CHT EMT BS , , Entered By: Donavan Burnet on 09/01/2022 09:05:00

## 2022-09-03 ENCOUNTER — Encounter (HOSPITAL_BASED_OUTPATIENT_CLINIC_OR_DEPARTMENT_OTHER): Payer: 59 | Admitting: General Surgery

## 2022-09-03 ENCOUNTER — Ambulatory Visit: Payer: 59 | Admitting: Radiation Oncology

## 2022-09-03 DIAGNOSIS — Z6829 Body mass index (BMI) 29.0-29.9, adult: Secondary | ICD-10-CM | POA: Diagnosis not present

## 2022-09-03 DIAGNOSIS — M313 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: Secondary | ICD-10-CM | POA: Diagnosis not present

## 2022-09-03 DIAGNOSIS — T8131XD Disruption of external operation (surgical) wound, not elsewhere classified, subsequent encounter: Secondary | ICD-10-CM | POA: Diagnosis not present

## 2022-09-03 DIAGNOSIS — F32A Depression, unspecified: Secondary | ICD-10-CM | POA: Diagnosis not present

## 2022-09-03 DIAGNOSIS — J4520 Mild intermittent asthma, uncomplicated: Secondary | ICD-10-CM | POA: Diagnosis not present

## 2022-09-03 DIAGNOSIS — T148XXA Other injury of unspecified body region, initial encounter: Secondary | ICD-10-CM | POA: Diagnosis not present

## 2022-09-03 DIAGNOSIS — G629 Polyneuropathy, unspecified: Secondary | ICD-10-CM | POA: Diagnosis not present

## 2022-09-03 DIAGNOSIS — D0512 Intraductal carcinoma in situ of left breast: Secondary | ICD-10-CM | POA: Diagnosis not present

## 2022-09-03 DIAGNOSIS — E669 Obesity, unspecified: Secondary | ICD-10-CM | POA: Diagnosis not present

## 2022-09-03 DIAGNOSIS — E7800 Pure hypercholesterolemia, unspecified: Secondary | ICD-10-CM | POA: Diagnosis not present

## 2022-09-03 DIAGNOSIS — F419 Anxiety disorder, unspecified: Secondary | ICD-10-CM | POA: Diagnosis not present

## 2022-09-03 NOTE — Progress Notes (Signed)
ASHDEN, OZER ([id number]) [id number] Page 1 of 2 Visit Report for 09/02/2022 HBO Details Patient Name: Date of Service: [name], [name] 09/02/2022 8:00 A M Medical Record Number: [id number] Patient Account Number: [id number] Date of Birth/Sex: Treating RN: [birth year] (52 y.o. 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 HBO Treatment Details Treatment Number: 21 Patient Type: Outpatient Chamber Type: Monoplace Chamber Serial #: [id number] Treatment Protocol: 2.0 ATA with 90 minutes oxygen, and no air breaks Treatment Details Compression Rate Down: 1.0 psi / minute De-Compression Rate Up: 1.0 psi / minute Air breaks and breathing Decompress Decompress Compress Tx Pressure Begins Reached periods Begins Ends (leave unused spaces blank) Chamber Pressure (ATA 1 2 ------2 1 ) Clock Time (24 hr) 08:05 08:18 - - - - - - 09:48 10:01 Treatment Length: 116 (minutes) Treatment Segments: 4 Vital Signs Capillary Blood Glucose Reference Range: 80 - 120 mg / dl HBO Diabetic Blood Glucose Intervention Range: <131 mg/dl or >249 mg/dl Type: Time Vitals Blood Respiratory Capillary Blood Glucose Pulse Action Pulse: Temperature: Taken: Pressure: Rate: Glucose (mg/dl): Meter #: Oximetry (%) Taken: Pre 08:10 118/88 78 20 96.6 none per protocol Post 10:04 124/79 85 18 97.3 none per protocol Treatment Response Treatment Toleration: Well Treatment Completion Status: Treatment Completed without Adverse Event Physician HBO Attestation: I certify that I supervised this HBO treatment in accordance  with Medicare guidelines. A trained emergency response team is readily available per Yes hospital policies and procedures. Continue HBOT as ordered. Yes Electronic Signature(s) Signed: 09/02/2022 12:16:51 PM By: [name] Maudlin MD FACS Previous Signature: 09/02/2022 11:47:25 AM Version By: [name] CHT EMT BS , , Entered By: [name] on 09/02/2022 12:16:51 -------------------------------------------------------------------------------- HBO Safety Checklist Details Patient Name: Date of Service: [name], [name] 09/02/2022 8:00 A M Medical Record Number: [id number] Patient Account Number: [id number] Date of Birth/Sex: Treating RN: August 21, 1970 (52 y.o. [name] Primary Care [name]: [name] Other Clinician: Donavan [name] Referring [name]: Treating [name]/Extender: [name] [place], [name] ([id number]) [id number] Page 2 of 2 Weeks in Treatment: 18 HBO Safety Checklist Items Safety Checklist Consent Form Signed Patient voided / foley secured and emptied When did you last eato 0715 Last dose of injectable or oral agent N/A Ostomy pouch emptied and vented if applicable NA All implantable devices assessed, documented and approved NA Intravenous access site secured and place NA Valuables secured Linens and cotton and cotton/polyester blend (less than 51% polyester) Personal oil-based products / skin lotions / body lotions removed Wigs or hairpieces removed NA Smoking or tobacco materials removed NA Books / newspapers / magazines / loose paper removed Cologne, aftershave, perfume and deodorant removed Jewelry removed (may wrap wedding band) Make-up removed Hair care products removed Battery operated devices (external) removed Heating patches and chemical warmers removed Titanium eyewear removed Nail polish cured greater than 10 hours greater than 10 hours Casting material cured  greater than 10 hours NA Hearing aids removed NA Loose dentures or partials removed NA Prosthetics have been removed NA Patient demonstrates correct use of air break device (if applicable) Patient concerns have been addressed Patient grounding bracelet on and cord attached to chamber Specifics for Inpatients (complete in addition to above) Medication sheet sent  with patient NA Intravenous medications needed or due during therapy sent with patient NA Drainage tubes (e.g. nasogastric tube or chest tube secured and vented) NA Endotracheal or Tracheotomy tube secured NA Cuff deflated of air and inflated with saline NA Airway suctioned NA Notes Paper version used prior to treatment start. Electronic Signature(s) Signed: 09/02/2022 11:46:30 AM By: [name] CHT EMT BS , , Entered By: [name] on 09/02/2022 11:46:30

## 2022-09-03 NOTE — Progress Notes (Signed)
ASHTON, WOODROW ([id number]) [id number] Page 1 of 9 Visit Report for 09/02/2022 Chief Complaint Document Details Patient Name: Date of Service: [name], [name] 09/02/2022 10:00 A M Medical Record Number: [id number] Patient Account Number: [id number] Date of Birth/Sex: Treating RN: 1971/06/22 (52 y.o. F) Primary Care Provider: [name] Other Clinician: Referring Provider: Treating Provider/Extender: [name], [name] Weeks in Treatment: 30 Information Obtained from: Patient Chief Complaint 04/24/2022; patient is here for a surgical wound on her left lateral breast Electronic Signature(s) Signed: 09/02/2022 10:18:54 AM By: [name] MD FACS Entered By: [name] on 09/02/2022 10:18:53 -------------------------------------------------------------------------------- Debridement Details Patient Name: Date of Service: Unk [name] 09/02/2022 10:00 A M Medical Record Number: [id number] Patient Account Number: [id number] Date of Birth/Sex: Treating RN: 1971-01-10 (52 y.o. Donney Rankins, [name] Primary Care Provider: [name] Other Clinician: Referring Provider: Treating Provider/Extender: Shea Stakes in Treatment: 18 Debridement Performed for Assessment: Wound #1 Left Breast Performed By: Physician [name], MD Debridement Type: Debridement Level of Consciousness (Pre-procedure): Awake and Alert Pre-procedure Verification/Time Out Yes - 10:13 Taken: Start Time: 10:13 Pain Control: Lidocaine 4% T opical Solution T Area Debrided (L x W): otal 1.5 (cm) x 1.5 (cm) = 2.25 (cm) Tissue and other material debrided: Non-Viable, Slough, Subcutaneous, Slough Level: Skin/Subcutaneous Tissue Debridement Description: Excisional Instrument: Curette Bleeding: Minimum Hemostasis Achieved: Pressure Response to Treatment: Procedure was tolerated well Level of Consciousness (Post- Awake  and Alert procedure): Post Debridement Measurements of Total Wound Length: (cm) 1.5 Width: (cm) 1.5 Depth: (cm) 0.1 Volume: (cm) 0.177 Character of Wound/Ulcer Post Debridement: Improved Post Procedure Diagnosis Same as Pre-procedure Notes scribed for Dr. Celine Ahr by [name], RN Electronic Signature(s) Signed: 09/02/2022 11:09:45 AM By: [name] MD FACS Signed: 09/02/2022 3:42:50 PM By: [name] Entered By: [name] on 09/02/2022 10:15:40 [name] ([id number]) [id number] Page 2 of 9 -------------------------------------------------------------------------------- HPI Details Patient Name: Date of Service: [name], [name] 09/02/2022 10:00 A M Medical Record Number: [id number] Patient Account Number: [id number] Date of Birth/Sex: Treating RN: 1970-11-18 (52 y.o. F) Primary Care Provider: [name] Other Clinician: Referring Provider: Treating Provider/Extender: [name], [name] Weeks in Treatment: 22 History of Present Illness HPI Description: ADMISSION 05/25/2022 This is a 52 year old woman who is found to have an abnormal mammogram of her left breast earlier this year showing a 13 mm group of calcifications in the upper quadrant of the left breast. 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 She underwent a left lumpectomy on 11/12/2021. The patient states the wound never really healed and was open. She underwent radiation therapy from 12/10/2021 through 01/19/22 28 fractions of 1.8GY. Essentially the wound would not heal. She was treated several times with antibiotics finally on 03/25/2022 she was taken to the OR by Dr. Barry Dienes for excision of the wound in a sinus. Operative cultures grew strep and Prevotella and a culture from the clinic Livedo MRSA. It was recommended for 4 weeks of Augmentin and doxycycline by  infectious disease. She was seen by wound care and she has been treating the wound with Medihoney ever since. 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 She was on meth methotrexate 20 mg once  a week however this I think was put on hold because of the wound followed by Dr. Posey Pronto of rheumatology. She has asthma. Most recent breast ultrasound was on 9/7 She has a large nonhealing wound on the lateral aspect of her left breast 04/30/2022: Although I do not have a photograph to compare to last week, the RN report is that it is substantially cleaner. She still has ample slough and nonviable fat and subcutaneous tissue present. She only was able to get her Santyl on Monday so she has just had a couple of days of treatment. 05/08/2022: The wound dimensions are about the same, but the surface is substantially cleaner. There is just a little bit of slough accumulation on the surface. 05/18/2022: The wound is a little bit smaller, but not much. It is quite a bit cleaner, however. 05/25/2022: The wound surface continues to improve. Very little slough accumulation this week. No substantial change in the wound dimensions. 06/01/2022: The wound surface continues to have a layer of slough on it. There is a crack in the surface near the 12 o'clock position that when further explored, revealed a cavity and tunnel that is about 2 cm deep. No purulent drainage or concern for infection. 12/11; left breast wound. The wound itself is somewhat smaller in size per our measurements. However she has a divot at 1:00 that measures about a 1.5 cm deep. We have been using iodoform packing and if it and Santyl to the rest of the wound This wound was initially had a left lumpectomy on  11/12/2021 she underwent 28 fractions of radiation. She potentially could benefit from hyperbaric oxygen 12/18; left breast wound. Better looking wound surface and improvement in measurements of the small tunnel. We have been using Santyl and iodoform. 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 Underneath the layer of slough, the tissue is much healthier-looking, with better color and some granulation tissue beginning to form. 07/13/2022: The wound continues to fill with better looking granulation tissue. There is very little slough accumulation and the cavity is shallower. We are going to submit for insurance approval for hyperbaric oxygen therapy, as it has now been 6 months since the time of her initial wound and radiation therapy. 07/20/2022: The cavity continues to fill in and the surface of the wound is improving. EKG and chest x-ray are complete and we are just awaiting insurance approval for hyperbarics. 07/27/2022: The cavity has filled in considerably and the surface of the wound is very clean with just a thin layer of light slough. We are still awaiting insurance approval for hyperbaric oxygen therapy. 08/03/2022: She was approved for hyperbaric oxygen therapy and received 2 treatments last week. It is rather remarkable the improvement already in her wound. The cavity continues to fill and there is epithelialization occurring around the edges of her wound. Minimal slough accumulation. 08/13/2022: Her wound is smaller and is epithelializing. There is no longer a tunnel or tract to pack. There is just slight slough accumulation on the surface. 08/19/2022: Her wound continues to contract and epithelialize. Light slough and eschar on the remaining open areas. 08/26/2022: Her wound is smaller by  about half this week. There is still some slough accumulation on the surface. 09/02/2022: There has been further epithelialization of her wound. It is nearly  completely flush with the surrounding skin. Minimal slough accumulation. Electronic Signature(s) Signed: 09/02/2022 10:19:38 AM By: [name] MD FACS Entered By: [name] on 09/02/2022 10:19:38 -------------------------------------------------------------------------------- Physical Exam Details Patient Name: Date of Service: [name]. 09/02/2022 10:00 A M Medical Record Number: [id number] Patient Account Number: [id number] AAZIYAH, NEGRON ([id number]) [phone].pdf Page 3 of 9 Date of Birth/Sex: Treating RN: 01-27-1971 (52 y.o. F) Primary Care Provider: [name] Other Clinician: Referring Provider: Treating Provider/Extender: [name], [name] Weeks in Treatment: 18 Constitutional . . . . no acute distress. Respiratory Normal work of breathing on room air. Notes 09/02/2022: There has been further epithelialization of her wound. It is nearly completely flush with the surrounding skin. Minimal slough accumulation. Electronic Signature(s) Signed: 09/02/2022 10:20:11 AM By: [name] MD FACS Entered By: [name] on 09/02/2022 10:20:11 -------------------------------------------------------------------------------- Physician Orders Details Patient Name: Date of Service: [name]. 09/02/2022 10:00 A M Medical Record Number: [id number] Patient Account Number: [id number] Date of Birth/Sex: Treating RN: [birth year] (52 y.o. [name] Provider: [name] Other Clinician: Referring Provider: Treating Provider/Extender: Shea Stakes in Treatment: 86 Verbal / Phone Orders: No Diagnosis Coding ICD-10 Coding Code Description S21.002D Unspecified open wound of left breast, subsequent  encounter T81.31XD Disruption of external operation (surgical) wound, not elsewhere classified, subsequent encounter 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 Follow-up Appointments ppointment in 1 week. - Dr. Celine Ahr Room 2 Return A Anesthetic (In clinic) Topical Lidocaine 4% applied to wound bed Bathing/ Shower/ Hygiene Other Bathing/Shower/Hygiene Orders/Instructions: - Change dressing after bathing Edema Control - Lymphedema / SCD / Other Other Edema Control Orders/Instructions: - Keep doing the Lymphadema checks Hyperbaric Oxygen Therapy Evaluate for HBO Therapy Indication: - soft tissue radionecrosis If appropriate for treatment, begin HBOT per protocol: 2.0 ATA for 90 Minutes without A Breaks ir Total Number of Treatments: - 40 One treatments per day (delivered Monday through Friday unless otherwise specified in Special Instructions below): A frin (Oxymetazoline HCL) 0.05% nasal spray - 1 spray in both nostrils daily as needed prior to HBO treatment for difficulty clearing ears Wound Treatment Wound #1 - Breast Wound Laterality: Left Cleanser: Normal Saline (Generic) 1 x Per Day/30 Days Discharge Instructions: Cleanse the wound with Normal Saline prior to applying a clean dressing using gauze sponges, not tissue or cotton balls. Cleanser: Soap and Water 1 x Per Day/30 Days Discharge Instructions: May shower and wash wound with dial antibacterial soap and water prior to dressing change. Cleanser: Byram Ancillary Kit - 15 Day Supply (Generic) 1 x Per Day/30 Days Discharge Instructions: Use supplies as instructed; Kit contains: (15) Saline Bullets; (15) 3x3 Gauze; 15 pr Gloves Prim Dressing: Promogran Prisma Matrix, 4.34 (sq in) (silver collagen) (Dispense As Written) 1 x Per Day/30 Days ary Discharge Instructions: Moisten collagen with saline or hydrogel SOLIE, HOOTMAN  ([id number]) [id number].pdf Page 4 of 9 Secondary Dressing: Zetuvit Plus Silicone Border Dressing 5x5 (in/in) (Dispense As Written) 1 x Per Day/30 Days Discharge Instructions: Apply silicone border over primary dressing as directed. Patient Medications llergies: kiwi, Ativan, erythromycin base, Xanax, nitrofurantoin A Notifications Medication Indication Start End 09/02/2022 lidocaine DOSE topical 4 % cream - cream topical Electronic Signature(s) Signed: 09/02/2022 11:09:45 AM By: [name] MD FACS Entered By: [name] on 09/02/2022 10:20:27 -------------------------------------------------------------------------------- Problem List Details Patient Name: Date of Service: [name], [place]  09/02/2022 10:00 A M Medical Record Number: [id number] Patient Account Number: [id number] Date of Birth/Sex: Treating RN: August 04, 1970 (52 y.o. F) Primary Care Provider: [name] Other Clinician: Referring Provider: Treating Provider/Extender: [name], [name] Weeks in Treatment: 81 Active Problems ICD-10 Encounter Code Description Active Date MDM Diagnosis S21.002D Unspecified open wound of left breast, subsequent encounter 04/24/2022 No Yes T81.31XD Disruption of external operation (surgical) wound, not elsewhere classified, 04/24/2022 No Yes subsequent encounter L59.8 Other specified disorders of the skin and subcutaneous tissue related to 04/24/2022 No Yes radiation 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 04/24/2022 No Yes Inactive Problems Resolved Problems Electronic Signature(s) Signed: 09/02/2022 10:18:38 AM By: [name] MD FACS Entered By: [name] on 09/02/2022 10:18:38 -------------------------------------------------------------------------------- Progress Note Details Patient Name: Date of Service: Unk [name] 09/02/2022 10:00 A M Medical Record Number: [id number] Patient Account  Number: [id number] Date of Birth/Sex: Treating RN: 1971/07/02 (52 y.o. F) Primary Care Provider: [name] Other Clinician: Referring Provider: Treating Provider/Extender: [name], Tobin Chad in Treatment: [address] [place] ([id number]) [id number] Page 5 of 9 Subjective Chief Complaint Information obtained from Patient 04/24/2022; patient is here for a surgical wound on her left lateral breast History of Present Illness (HPI) ADMISSION 05/25/2022 This is a 52 year old woman who is found to have an abnormal mammogram of her left breast earlier this year showing a 13 mm group of calcifications in the upper quadrant of the left breast. 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 She underwent a left lumpectomy on 11/12/2021. The patient states the wound never really healed and was open. She underwent radiation therapy from 12/10/2021 through 01/19/22 28 fractions of 1.8GY. Essentially the wound would not heal. She was treated several times with antibiotics finally on 03/25/2022 she was taken to the OR by Dr. Barry Dienes for excision of the wound in a sinus. Operative cultures grew strep and Prevotella and a culture from the clinic Livedo MRSA. It was recommended for 4 weeks of Augmentin and doxycycline by infectious disease. She was seen by wound care and she has been treating the wound with Medihoney ever since. 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 She was on meth methotrexate 20 mg once a week however this I think was put on hold because of the wound followed by Dr. Posey Pronto of rheumatology. She has asthma. Most recent breast  ultrasound was on 9/7 She has a large nonhealing wound on the lateral aspect of her left breast 04/30/2022: Although I do not have a photograph to compare to last week, the RN report is that it is substantially cleaner. She still has ample slough and nonviable fat and subcutaneous tissue present. She only was able to get her Santyl on Monday so she has just had a couple of days of treatment. 05/08/2022: The wound dimensions are about the same, but the surface is substantially cleaner. There is just a little bit of slough accumulation on the surface. 05/18/2022: The wound is a little bit smaller, but not much. It is quite a bit cleaner, however. 05/25/2022: The wound surface continues to improve. Very little slough accumulation this week. No substantial change in the wound dimensions. 06/01/2022: The wound surface continues to have  a layer of slough on it. There is a crack in the surface near the 12 o'clock position that when further explored, revealed a cavity and tunnel that is about 2 cm deep. No purulent drainage or concern for infection. 12/11; left breast wound. The wound itself is somewhat smaller in size per our measurements. However she has a divot at 1:00 that measures about a 1.5 cm deep. We have been using iodoform packing and if it and Santyl to the rest of the wound This wound was initially had a left lumpectomy on 11/12/2021 she underwent 28 fractions of radiation. She potentially could benefit from hyperbaric oxygen 12/18; left breast wound. Better looking wound surface and improvement in measurements of the small tunnel. We have been using Santyl and iodoform. 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 Underneath the layer of slough, the tissue is much healthier-looking, with better  color and some granulation tissue beginning to form. 07/13/2022: The wound continues to fill with better looking granulation tissue. There is very little slough accumulation and the cavity is shallower. We are going to submit for insurance approval for hyperbaric oxygen therapy, as it has now been 6 months since the time of her initial wound and radiation therapy. 07/20/2022: The cavity continues to fill in and the surface of the wound is improving. EKG and chest x-ray are complete and we are just awaiting insurance approval for hyperbarics. 07/27/2022: The cavity has filled in considerably and the surface of the wound is very clean with just a thin layer of light slough. We are still awaiting insurance approval for hyperbaric oxygen therapy. 08/03/2022: She was approved for hyperbaric oxygen therapy and received 2 treatments last week. It is rather remarkable the improvement already in her wound. The cavity continues to fill and there is epithelialization occurring around the edges of her wound. Minimal slough accumulation. 08/13/2022: Her wound is smaller and is epithelializing. There is no longer a tunnel or tract to pack. There is just slight slough accumulation on the surface. 08/19/2022: Her wound continues to contract and epithelialize. Light slough and eschar on the remaining open areas. 08/26/2022: Her wound is smaller by about half this week. There is still some slough accumulation on the surface. 09/02/2022: There has been further epithelialization of her wound. It is nearly completely flush with the surrounding skin. Minimal slough accumulation. Patient History Information obtained from Patient. Social History Never smoker, Marital Status - Married, Alcohol Use - Moderate, Drug Use - No History, Caffeine Use - Daily. Medical History Hematologic/Lymphatic Patient has history of Lymphedema - Left breast-gets Lymphadema tx Hospitalization/Surgery History - 03/25/22 Left Breast cyst excision;  11/12/21 Left Breast Lumpectomy with Radioactive seeds;Left breat Biopsy;Anterior. 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 12/08/21- 01/29/22- 37 rounds of Radiation Psychiatric General Anxiety Objective Constitutional no acute distress. Vitals Time Taken: 10:09 AM, Height: 63 in, Weight: 170 lbs, BMI: 30.1, Temperature: 97.8 F, Pulse: 80 bpm, Respiratory Rate: 16 breaths/min, Blood Pressure: 121/76 mmHg. Respiratory Normal work of breathing on room air. General Notes: 09/02/2022: There has been further epithelialization of her wound. It is nearly completely flush with the surrounding skin.  Minimal slough accumulation. Integumentary (Hair, Skin) Wound #1 status is Open. Original cause of wound was Surgical Injury. The date acquired was: 04/03/2022. The wound has been in treatment 18 weeks. The wound is located on the Left Breast. The wound measures 1.5cm length x 1.5cm width x 0.1cm depth; 1.767cm^2 area and 0.177cm^3 volume. There is Fat Layer (Subcutaneous Tissue) exposed. There is no tunneling or undermining noted. There is a medium amount of serosanguineous drainage noted. The wound margin is distinct with the outline attached to the wound base. There is medium (34-66%) red, pink granulation within the wound bed. There is a medium (34-66%) amount of necrotic tissue within the wound bed including Adherent Slough. The periwound skin appearance had no abnormalities noted for moisture. The periwound skin appearance had no abnormalities noted for color. The periwound skin appearance exhibited: Induration. Periwound temperature was noted as No Abnormality. Assessment Active Problems ICD-10 Unspecified open wound of left breast,  subsequent encounter Disruption of external operation (surgical) wound, not elsewhere classified, subsequent encounter Other specified disorders of the skin and subcutaneous tissue related to radiation Intraductal carcinoma in situ of left breast Procedures Wound #1 Pre-procedure diagnosis of Wound #1 is a Malignant Wound located on the Left Breast . There was a Excisional Skin/Subcutaneous Tissue Debridement with a total area of 2.25 sq cm performed by [name], MD. With the following instrument(s): Curette to remove Non-Viable tissue/material. Material removed includes Subcutaneous Tissue and Slough and after achieving pain control using Lidocaine 4% T opical Solution. No specimens were taken. A time out was conducted at 10:13, prior to the start of the procedure. A Minimum amount of bleeding was controlled with Pressure. The procedure was tolerated well. Post Debridement Measurements: 1.5cm length x 1.5cm width x 0.1cm depth; 0.177cm^3 volume. Character of Wound/Ulcer Post Debridement is improved. Post procedure Diagnosis Wound #1: Same as Pre-Procedure General Notes: scribed for Dr. Celine Ahr by [name], RN. Plan Follow-up Appointments: Return Appointment in 1 week. - Dr. Celine Ahr Room 2 Anesthetic: (In clinic) Topical Lidocaine 4% applied to wound bed Bathing/ Shower/ Hygiene: Other Bathing/Shower/Hygiene Orders/Instructions: - Change dressing after bathing Edema Control - Lymphedema / SCD / Other: Other Edema Control Orders/Instructions: - Keep doing the Lymphadema checks Hyperbaric Oxygen Therapy: Evaluate for HBO Therapy Indication: - soft tissue radionecrosis VEERA, DOCKENDORF ([id number]) [id number] Page 7 of 9 If appropriate for treatment, begin HBOT per protocol: 2.0 ATA for 90 Minutes without Air Breaks T Number of Treatments: - 40 otal One treatments per day (delivered Monday through Friday unless otherwise specified in Special  Instructions below): Afrin (Oxymetazoline HCL) 0.05% nasal spray - 1 spray in both nostrils daily as needed prior to HBO treatment for difficulty clearing ears The following medication(s) was prescribed: lidocaine topical 4 % cream cream topical was prescribed at facility WOUND #1: - Breast Wound Laterality: Left Cleanser: Normal Saline (Generic) 1 x Per Day/30 Days Discharge Instructions: Cleanse the wound with Normal Saline prior to applying a clean dressing using gauze sponges, not tissue or cotton balls. Cleanser: Soap and Water 1 x Per Day/30 Days Discharge Instructions: May shower and wash wound with dial antibacterial soap and water prior to dressing change. Cleanser: Byram Ancillary Kit - 15 Day Supply (Generic) 1 x Per Day/30 Days Discharge Instructions: Use supplies as instructed; Kit contains: (15) Saline Bullets; (15) 3x3 Gauze; 15 pr Gloves Prim Dressing: Promogran Prisma Matrix, 4.34 (sq in) (silver collagen) (Dispense As Written) 1 x Per Day/30 Days ary Discharge Instructions: Moisten collagen with saline  or hydrogel Secondary Dressing: Zetuvit Plus Silicone Border Dressing 5x5 (in/in) (Dispense As Written) 1 x Per Day/30 Days Discharge Instructions: Apply silicone border over primary dressing as directed. 09/02/2022: There has been further epithelialization of her wound. It is nearly completely flush with the surrounding skin. Minimal slough accumulation. I used a curette to debride the slough off of the wound. I also debrided subcutaneous tissue to try and stimulate the healing cascade. We will continue Prisma silver collagen. She will continue hyperbaric oxygen therapy. Follow-up in 1 week. Electronic Signature(s) Signed: 09/02/2022 10:21:03 AM By: [name] MD FACS Entered By: [name] on 09/02/2022 10:21:02 -------------------------------------------------------------------------------- HxROS Details Patient Name: Date of Service: [name], [name] NIE W.  09/02/2022 10:00 A M Medical Record Number: [id number] Patient Account Number: [id number] Date of Birth/Sex: Treating RN: Dec 02, 1970 (52 y.o. F) Primary Care Provider: [name] Other Clinician: Referring Provider: Treating Provider/Extender: Shea Stakes in Treatment: 48 Information Obtained From Patient Ear/Nose/Mouth/Throat Medical History: Past Medical History Notes: Left ear limited hearing Hematologic/Lymphatic Medical History: Positive for: Lymphedema - Left breast-gets Lymphadema tx Gastrointestinal Medical History: Past Medical History Notes: GERD Immunological Medical History: Past Medical History Notes: Wegener"s Granulomatosis Musculoskeletal Medical History: Past Medical History Notes: "RA like arthritis" as per patient Oncologic Medical History: Past Medical History NotesSHAINAH, TALWAR ([id number]) [id number] Page 8 of 9 Left Breast. 12/08/21- 01/29/22- 37 rounds of Radiation Psychiatric Medical History: Past Medical History Notes: General Anxiety Immunizations Pneumococcal Vaccine: Received Pneumococcal Vaccination: Yes Received Pneumococcal Vaccination On or After 60th Birthday: No Implantable Devices Yes Hospitalization / Surgery History Type of Hospitalization/Surgery 03/25/22 Left Breast cyst excision; 11/12/21 Left Breast Lumpectomy with Radioactive seeds;Left breat Biopsy;Anterior Family and Social History Never smoker; Marital Status - Married; Alcohol Use: Moderate; Drug Use: No History; Caffeine Use: Daily; Financial Concerns: No; Food, Clothing or Shelter Needs: No; Support System Lacking: No; Transportation Concerns: No Electronic Signature(s) Signed: 09/02/2022 11:09:45 AM By: [name] MD FACS Entered By: [name] on 09/02/2022 10:19:44 -------------------------------------------------------------------------------- SuperBill Details Patient Name: Date of  Service: [name]. 09/02/2022 Medical Record Number: [id number] Patient Account Number: [id number] Date of Birth/Sex: Treating RN: 1970/12/14 (52 y.o. 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 Facility Procedures : CPT4 Code: [id number] Description: [id number] - DEB SUBQ TISSUE 20 SQ CM/< ICD-10 Diagnosis Description S21.002D Unspecified open wound of left breast, subsequent encounter Modifier: Quantity: 1 Physician Procedures : CPT4 Code Description Modifier [id number] - WC PHYS LEVEL 3 - EST PT 25 ICD-10 Diagnosis Description S21.002D Unspecified open wound of left breast, subsequent encounter T81.31XD Disruption of external operation (surgical) wound, not elsewhere  classified, subsequent encounter 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

## 2022-09-03 NOTE — Progress Notes (Signed)
CERESA, MCCLESKEY ([id number]) [id number] Page 1 of 6 Visit Report for 09/02/2022 Arrival Information Details Patient Name: Date of Service: [name], [name] 09/02/2022 10:00 A M Medical Record Number: [id number] Patient Account Number: [id number] Date of Birth/Sex: Treating RN: [name] 11, 1972 (52 y.o. [name] Primary Care [name]: [name] Other Clinician: Referring [name]: Treating [name]/Extender: [name] in Treatment: 18 Visit Information History Since Last Visit Added or deleted any medications: No Patient Arrived: Ambulatory Any new allergies or adverse reactions: No Arrival Time: 10:08 Had a fall or experienced change in No Accompanied By: self activities of daily living that may affect Transfer Assistance: None risk of falls: Patient Identification Verified: Yes Signs or symptoms of abuse/neglect since last visito No Secondary Verification Process Completed: Yes Hospitalized since last visit: No Patient Requires Transmission-Based Precautions: No Implantable device outside of the clinic excluding No Patient Has Alerts: No cellular tissue based products placed in the center since last visit: Has Dressing in Place as Prescribed: Yes Pain Present Now: No Electronic Signature(s) Signed: 09/02/2022 3:42:50 PM By: [name] Entered By: [name] on 09/02/2022 10:09:23 -------------------------------------------------------------------------------- Encounter Discharge Information Details Patient Name: Date of Service: [name], [name] Bushy NIE W. 09/02/2022 10:00 A M Medical Record Number: [id number] Patient Account Number: [id number] Date of Birth/Sex: Treating RN: [name] (52 y.o. [name] Primary Care [name]: [name] Other Clinician: Referring [name]: Treating [name]/Extender: [name] in Treatment: 54 Encounter Discharge  Information Items Post Procedure Vitals Discharge Condition: Stable Temperature (F): 97.8 Ambulatory Status: Ambulatory Pulse (bpm): 80 Discharge Destination: Home Respiratory Rate (breaths/min): 16 Transportation: Private Auto Blood Pressure (mmHg): 121/76 Accompanied By: self Schedule Follow-up Appointment: Yes Clinical Summary of Care: Patient Declined Electronic Signature(s) Signed: 09/02/2022 3:42:50 PM By: [name] Entered By: [name] on 09/02/2022 10:21:58 -------------------------------------------------------------------------------- Lower Extremity Assessment Details Patient Name: Date of Service: [name] 09/02/2022 10:00 A M Medical Record Number: [id number] Patient Account Number: [id number] Date of Birth/Sex: Treating RN: Jun 03, 1971 (52 y.o. [name] Primary Care Daquarius Dubeau: [name] Other Clinician: Referring [name]: Treating [name]/Extender: [name], [name] in Treatment: 18 Electronic Signature(s) Signed: 09/02/2022 3:42:50 PM By: Ignacia Palma ([id number]) [id number] Page 2 of 6 Entered By: [name] on 09/02/2022 10:10:01 -------------------------------------------------------------------------------- Multi Wound Chart Details Patient Name: Date of Service: [name], [name] 09/02/2022 10:00 A M Medical Record Number: [id number] Patient Account Number: [id number] Date of Birth/Sex: Treating RN: [name] 02, 1972 (52 y.o. F) Primary Care Angeleena Dueitt: [name] Other Clinician: Referring [name]: Treating [name]/Extender: [name], [name] in Treatment: 18 Vital Signs Height(in): 63 Pulse(bpm): 80 Weight(lbs): 170 Blood Pressure(mmHg): 121/76 Body Mass Index(BMI): 30.1 Temperature(F): 97.8 Respiratory Rate(breaths/min): 16 [1:Photos:] [N/A:N/A] Left Breast N/A N/A Wound Location: Surgical Injury N/A  N/A Wounding Event: Malignant Wound N/A N/A Primary Etiology: Lymphedema N/A N/A Comorbid History: 04/03/2022 N/A N/A Date Acquired: 68 N/A N/A [name] of Treatment: Open N/A N/A Wound Status: No N/A N/A Wound Recurrence: 1.5x1.5x0.1 N/A N/A Measurements L x W x D (cm) 1.767 N/A N/A A (cm) : rea 0.177 N/A N/A Volume (cm) : 86.70% N/A N/A % Reduction in A rea: 96.70% N/A N/A % Reduction in Volume: Full Thickness Without Exposed N/A N/A Classification: Support Structures Medium N/A N/A Exudate A mount: Serosanguineous N/A N/A Exudate Type: red, brown N/A N/A Exudate Color: Distinct, outline attached N/A N/A Wound Margin: Medium (34-66%) N/A N/A Granulation A mount: Red, Pink N/A  N/A Granulation Quality: Medium (34-66%) N/A N/A Necrotic A mount: Fat Layer (Subcutaneous Tissue): Yes N/A N/A Exposed Structures: Fascia: No Tendon: No Muscle: No Joint: No Bone: No Medium (34-66%) N/A N/A Epithelialization: Debridement - Excisional N/A N/A Debridement: Pre-procedure Verification/Time Out 10:13 N/A N/A Taken: Lidocaine 4% Topical Solution N/A N/A Pain Control: Subcutaneous, Slough N/A N/A Tissue Debrided: Skin/Subcutaneous Tissue N/A N/A Level: 2.25 N/A N/A Debridement A (sq cm): rea Curette N/A N/A Instrument: Minimum N/A N/A Bleeding: Pressure N/A N/A Hemostasis A chieved: Procedure was tolerated well N/A N/A Debridement Treatment Response: 1.5x1.5x0.1 N/A N/A Post Debridement Measurements L x W x D (cm) 0.177 N/A N/A Post Debridement Volume: (cm) Induration: Yes N/A N/A Periwound Skin Texture: No Abnormalities Noted N/A N/A Periwound Skin Moisture: No Abnormalities Noted N/A N/A Periwound Skin Color: No Abnormality N/A N/A Temperature: Debridement N/A N/A Procedures Performed: [name], [name] ([id number]) [id number] Page 3 of 6 Treatment Notes Electronic Signature(s) Signed: 09/02/2022 10:18:45 AM By:  [name] Maudlin MD FACS Entered By: [name] on 09/02/2022 10:18:45 -------------------------------------------------------------------------------- Multi-Disciplinary Care Plan Details Patient Name: Date of Service: [name], [name] Bushy NIE W. 09/02/2022 10:00 A M Medical Record Number: [id number] Patient Account Number: [id number] Date of Birth/Sex: Treating RN: Nov 13, 1970 (52 y.o. [name] Primary Care Shye Doty: [name] Other Clinician: Referring Brittainy Bucker: Treating Minnette Merida/Extender: [name] in Treatment: [place] reviewed with physician Active Inactive Wound/Skin Impairment Nursing Diagnoses: Impaired tissue integrity Goals: Patient/caregiver will verbalize understanding of skin care regimen Date Initiated: 04/24/2022 Target Resolution Date: 10/02/2022 Goal Status: Active Interventions: Assess ulceration(s) every visit Treatment Activities: Skin care regimen initiated : 04/24/2022 Notes: Electronic Signature(s) Signed: 09/02/2022 3:42:50 PM By: [name] Entered By: [name] on 09/02/2022 10:15:55 -------------------------------------------------------------------------------- Pain Assessment Details Patient Name: Date of Service: [name] 09/02/2022 10:00 A M Medical Record Number: [id number] Patient Account Number: [id number] Date of Birth/Sex: Treating RN: 06-26-1971 (52 y.o. [name] Primary Care Joy Haegele: [name] Other Clinician: Referring Katrece Roediger: Treating Krystian Ferrentino/Extender: [name] in Treatment: 2 Active Problems Location of Pain Severity and Description of Pain Patient Has Paino No Site Locations Rate the pain. MAIGEN, SUTHAR ([id number]) [id number] Page 4 of 6 Rate the pain. Current Pain Level: 0 Pain Management and Medication Current Pain Management: Electronic  Signature(s) Signed: 09/02/2022 3:42:50 PM By: [name] Entered By: [name] on 09/02/2022 10:09:59 -------------------------------------------------------------------------------- Patient/Caregiver Education Details Patient Name: Date of Service: [name], [name] NIE W. 2/28/2024andnbsp10:00 A M Medical Record Number: [id number] Patient Account Number: [id number] Date of Birth/Gender: Treating RN: [birth year] (52 y.o. [name] Primary Care Physician: [name] Other Clinician: Referring Physician: Treating Physician/Extender: [name] in Treatment: 54 Education Assessment Education Provided To: Patient Education Topics Provided Wound/Skin Impairment: Methods: Explain/Verbal Responses: Reinforcements needed, State content correctly Electronic Signature(s) Signed: 09/02/2022 3:42:50 PM By: [name] Entered By: [name] on 09/02/2022 10:17:50 -------------------------------------------------------------------------------- Wound Assessment Details Patient Name: Date of Service: [name] 09/02/2022 10:00 A M Medical Record Number: [id number] Patient Account Number: [id number] Date of Birth/Sex: Treating RN: [birth year] (52 y.o. [name] Primary Care Davied Nocito: [name] Other Clinician: Referring Kalaya Infantino: Treating Crystall Donaldson/Extender: [name], [name] in Treatment: 18 Wound Status Wound Number: 1 Primary Etiology: Malignant Wound Wound Location: Left Breast Wound Status: Open Wounding Event: Surgical Injury Comorbid History: Lymphedema Date Acquired: 04/03/2022 [name] Of Treatment: 18 Clustered Wound: No [name], [name] ([id number]) [id number].pdf Page  5 of 6 Photos Wound Measurements Length: (cm) 1.5 Width: (cm) 1.5 Depth: (cm) 0.1 Area: (cm) 1.767 Volume: (cm) 0.177 % Reduction in Area: 86.7% % Reduction in Volume:  96.7% Epithelialization: Medium (34-66%) Tunneling: No Undermining: No Wound Description Classification: Full Thickness Without Exposed Support Structures Wound Margin: Distinct, outline attached Exudate Amount: Medium Exudate Type: Serosanguineous Exudate Color: red, brown Foul Odor After Cleansing: No Slough/Fibrino Yes Wound Bed Granulation Amount: Medium (34-66%) Exposed Structure Granulation Quality: Red, Pink Fascia Exposed: No Necrotic Amount: Medium (34-66%) Fat Layer (Subcutaneous Tissue) Exposed: Yes Necrotic Quality: Adherent Slough Tendon Exposed: No Muscle Exposed: No Joint Exposed: No Bone Exposed: No Periwound Skin Texture Texture Color No Abnormalities Noted: No No Abnormalities Noted: Yes Induration: Yes Temperature / Pain Temperature: No Abnormality Moisture No Abnormalities Noted: Yes Treatment Notes Wound #1 (Breast) Wound Laterality: Left Cleanser Normal Saline Discharge Instruction: Cleanse the wound with Normal Saline prior to applying a clean dressing using gauze sponges, not tissue or cotton balls. Soap and Water Discharge Instruction: May shower and wash wound with dial antibacterial soap and water prior to dressing change. [employer] Ancillary Kit - 15 Day Supply Discharge Instruction: Use supplies as instructed; Kit contains: (15) Saline Bullets; (15) 3x3 Gauze; 15 pr Gloves Peri-Wound Care Topical Primary Dressing Promogran Prisma Matrix, 4.34 (sq in) (silver collagen) Discharge Instruction: Moisten collagen with saline or hydrogel Secondary Dressing Zetuvit Plus Silicone Border Dressing 5x5 (in/in) Discharge Instruction: Apply silicone border over primary dressing as directed. Secured With [employer] Compression Stockings [name], [name] ([id number]) [id number] Page 6 of 6 Add-Ons Electronic Signature(s) Signed: 09/02/2022 3:42:50 PM By: Sabas Sous By: [name] on 09/02/2022  10:12:19 -------------------------------------------------------------------------------- Vitals Details Patient Name: Date of Service: [name], [name] Bushy NIE W. 09/02/2022 10:00 A M Medical Record Number: [id number] Patient Account Number: [id number] Date of Birth/Sex: Treating RN: October 25, 1970 (52 y.o. [name] Primary Care Jamontae Thwaites: [name] Other Clinician: Referring Darneisha Windhorst: Treating Alcide Memoli/Extender: [name], [name] in Treatment: 18 Vital Signs Time Taken: 10:09 Temperature (F): 97.8 Height (in): 63 Pulse (bpm): 80 Weight (lbs): 170 Respiratory Rate (breaths/min): 16 Body Mass Index (BMI): 30.1 Blood Pressure (mmHg): 121/76 Reference Range: 80 - 120 mg / dl Electronic Signature(s) Signed: 09/02/2022 3:42:50 PM By: [name] Entered By: [name] on 09/02/2022 10:09:54

## 2022-09-03 NOTE — Progress Notes (Signed)
KESHUNA, HIGH ([id number]) [id number].pdf Page 1 of 1 Visit Report for 09/02/2022 SuperBill Details Patient Name: Date of Service: [name] 09/02/2022 Medical Record Number: [id number] Patient Account Number: [id number] Date of Birth/Sex: Treating RN: 1970/11/11 (52 y.o. 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 Facility Procedures CPT4 Code Description Modifier Quantity [id number]-(Facility Use Only) HBOT full body chamber, 34mn , 4 ICD-10 Diagnosis Description 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 S21.002D Unspecified open wound of left breast, subsequent encounter T81.31XD Disruption of external operation (surgical) wound, not elsewhere classified, subsequent encounter 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 Physician Procedures Quantity CPT4 Code Description Modifier [id number] PHYS HYPERBARIC OXYGEN THERAPY 1 ICD-10 Diagnosis Description 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 S21.002D Unspecified open wound of left breast, subsequent encounter T81.31XD Disruption of external operation (surgical) wound, not elsewhere classified, subsequent encounter 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

## 2022-09-03 NOTE — Progress Notes (Signed)
[name], [name] ([id number]) [phone].pdf Page 1 of 2 Visit Report for 09/02/2022 Arrival Information Details Patient Name: Date of Service: [name] 09/02/2022 8:00 A M Medical Record Number: [id number] Patient Account Number: [id number] Date of Birth/Sex: Treating RN: 05-25-1971 (52 y.o. [name] Arvle Grabe: [name] Other Clinician: Donavan Burnet Referring Avyukt Cimo: Treating Tae Vonada/Extender: Shea Stakes in Treatment: 77 Visit Information History Since Last Visit All ordered tests and consults were completed: Yes Patient Arrived: Ambulatory Added or deleted any medications: No Arrival Time: 07:42 Any new allergies or adverse reactions: No Accompanied By: self Had a fall or experienced change in No Transfer Assistance: None activities of daily living that may affect Patient Identification Verified: Yes risk of falls: Secondary Verification Process Completed: Yes Signs or symptoms of abuse/neglect since last visito No Patient Requires Transmission-Based Precautions: No Hospitalized since last visit: No Patient Has Alerts: No Implantable device outside of the clinic excluding No cellular tissue based products placed in the center since last visit: Pain Present Now: No Electronic Signature(s) Signed: 09/02/2022 11:44:28 AM By: Donavan Burnet CHT EMT BS , , Entered By: Donavan Burnet on 09/02/2022 11:44:28 -------------------------------------------------------------------------------- Encounter Discharge Information Details Patient Name: Date of Service: [name] RD, [name] 09/02/2022 8:00 A M Medical Record Number: [id number] Patient Account Number: [id number] Date of Birth/Sex: Treating RN: Nov 30, 1970 (52 y.o. [name] Lorcan Shelp: [name] Other Clinician: Donavan Burnet Referring Sharnika Binney: Treating Ica Daye/Extender: Shea Stakes in Treatment: 98 Encounter Discharge Information Items Discharge Condition: Stable Ambulatory Status: Ambulatory Discharge Destination: Home Transportation: Private Auto Accompanied By: self Schedule Follow-up Appointment: No Clinical Summary of Care: Electronic Signature(s) Signed: 09/02/2022 12:08:20 PM By: Donavan Burnet CHT EMT BS , , Entered By: Donavan Burnet on 09/02/2022 12:08:20 -------------------------------------------------------------------------------- Vitals Details Patient Name: Date of Service: [name] RD, [name] 09/02/2022 8:00 A M Medical Record Number: [id number] Patient Account Number: [id number] Date of Birth/Sex: Treating RN: Jun 06, 1971 (52 y.o. [name] Janeal Abadi: [name] Other Clinician: Donavan Burnet Referring Deloras Reichard: Treating Analie Katzman/Extender: [name], [name] Weeks in Treatment: 18 Vital Signs Time Taken: 08:10 Temperature (F): 96.6 [name] ([id number]) [name]:[id number].pdf Page 2 of 2 Height (in): 63 Pulse (bpm): 78 Weight (lbs): 170 Respiratory Rate (breaths/min): 20 Body Mass Index (BMI): 30.1 Blood Pressure (mmHg): 118/88 Reference Range: 80 - 120 mg / dl Electronic Signature(s) Signed: 09/02/2022 11:45:01 AM By: Donavan Burnet CHT EMT BS , , Entered By: Donavan Burnet on 09/02/2022 11:45:01

## 2022-09-04 ENCOUNTER — Encounter (HOSPITAL_BASED_OUTPATIENT_CLINIC_OR_DEPARTMENT_OTHER): Payer: 59 | Attending: General Surgery | Admitting: General Surgery

## 2022-09-04 DIAGNOSIS — D0512 Intraductal carcinoma in situ of left breast: Secondary | ICD-10-CM | POA: Insufficient documentation

## 2022-09-04 DIAGNOSIS — Z8614 Personal history of Methicillin resistant Staphylococcus aureus infection: Secondary | ICD-10-CM | POA: Diagnosis not present

## 2022-09-04 DIAGNOSIS — T8131XD Disruption of external operation (surgical) wound, not elsewhere classified, subsequent encounter: Secondary | ICD-10-CM | POA: Insufficient documentation

## 2022-09-04 DIAGNOSIS — S21002D Unspecified open wound of left breast, subsequent encounter: Secondary | ICD-10-CM | POA: Insufficient documentation

## 2022-09-04 DIAGNOSIS — X58XXXD Exposure to other specified factors, subsequent encounter: Secondary | ICD-10-CM | POA: Insufficient documentation

## 2022-09-04 DIAGNOSIS — L59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: Secondary | ICD-10-CM | POA: Diagnosis not present

## 2022-09-07 ENCOUNTER — Encounter (HOSPITAL_BASED_OUTPATIENT_CLINIC_OR_DEPARTMENT_OTHER): Payer: 59 | Admitting: General Surgery

## 2022-09-07 NOTE — Progress Notes (Signed)
[name], [name] ([id number]) [id number] Page 1 of 1 Visit Report for 09/04/2022 SuperBill Details Patient Name: Date of Service: [name] 09/04/2022 Medical Record Number: [id number] Patient Account Number: [id number] Date of Birth/Sex: Treating RN: September 06, 1970 (52 y.o. 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 Facility Procedures CPT4 Code Description Modifier Quantity [id number] G0277-(Facility Use Only) HBOT full body chamber, 39mn , 4 ICD-10 Diagnosis Description 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 S21.002D Unspecified open wound of left breast, subsequent encounter T81.31XD Disruption of external operation (surgical) wound, not elsewhere classified, subsequent encounter 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 Physician Procedures Quantity CPT4 Code Description Modifier [id number] PHYS HYPERBARIC OXYGEN THERAPY 1 ICD-10 Diagnosis Description 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 S21.002D Unspecified open wound of left breast, subsequent encounter T81.31XD Disruption of external operation (surgical) wound, not elsewhere classified, subsequent encounter 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

## 2022-09-07 NOTE — Progress Notes (Signed)
[name], [name] ([id number]) [phone].pdf Page 1 of 2 Visit Report for 09/04/2022 Arrival Information Details Patient Name: Date of Service: [name], [name] 09/04/2022 8:00 A M Medical Record Number: [id number] Patient Account Number: [id number] Date of Birth/Sex: Treating RN: Feb 02, 1971 (52 y.o. Valarie Cones, Mechele Claude Primary Care Deisi Salonga: [name] Other Clinician: Donavan Burnet Referring Brylon Brenning: Treating Rosealie Reach/Extender: Shea Stakes in Treatment: 53 Visit Information History Since Last Visit All ordered tests and consults were completed: Yes Patient Arrived: Ambulatory Added or deleted any medications: No Arrival Time: 07:45 Any new allergies or adverse reactions: No Accompanied By: self Had a fall or experienced change in No Transfer Assistance: None activities of daily living that may affect Patient Identification Verified: Yes risk of falls: Secondary Verification Process Completed: Yes Signs or symptoms of abuse/neglect since last visito No Patient Requires Transmission-Based Precautions: No Hospitalized since last visit: No Patient Has Alerts: No Implantable device outside of the clinic excluding No cellular tissue based products placed in the center since last visit: Pain Present Now: No Notes Front desk check in late due to staffing. Electronic Signature(s) Signed: 09/04/2022 10:41:14 AM By: Donavan Burnet CHT EMT BS , , Entered By: Donavan Burnet on 09/04/2022 10:41:13 -------------------------------------------------------------------------------- Encounter Discharge Information Details Patient Name: Date of Service: [name], [name] 09/04/2022 8:00 A M Medical Record Number: [id number] Patient Account Number: [id number] Date of Birth/Sex: Treating RN: [birth year] (52 y.o. [name] Primary Care Catriona Dillenbeck: [name] Other Clinician: Donavan Burnet Referring  Tanda Morrissey: Treating Kingsly Kloepfer/Extender: Shea Stakes in Treatment: 80 Encounter Discharge Information Items Discharge Condition: Stable Ambulatory Status: Ambulatory Discharge Destination: Home Transportation: Private Auto Accompanied By: self Schedule Follow-up Appointment: No Clinical Summary of Care: Electronic Signature(s) Signed: 09/04/2022 11:19:04 AM By: Donavan Burnet CHT EMT BS , , Entered By: Donavan Burnet on 09/04/2022 11:19:04 -------------------------------------------------------------------------------- Vitals Details Patient Name: Date of Service: [name], [name] 09/04/2022 8:00 A M Medical Record Number: [id number] Patient Account Number: [id number] Date of Birth/Sex: Treating RN: Nov 17, 1970 (52 y.o. [name] Primary Care Janara Klett: [name] Other Clinician: Donavan Burnet Referring Andrian Urbach: Treating Derinda Bartus/Extender: Shea Stakes in Treatment: [address], [place] W ([id number]) [id number] Page 2 of 2 Vital Signs Time Taken: 07:57 Temperature (F): 97.7 Height (in): 63 Pulse (bpm): 80 Weight (lbs): 170 Respiratory Rate (breaths/min): 20 Body Mass Index (BMI): 30.1 Blood Pressure (mmHg): 124/75 Reference Range: 80 - 120 mg / dl Electronic Signature(s) Signed: 09/04/2022 10:41:33 AM By: Donavan Burnet CHT EMT BS , , Entered By: Donavan Burnet on 09/04/2022 10:41:33

## 2022-09-08 ENCOUNTER — Encounter (HOSPITAL_BASED_OUTPATIENT_CLINIC_OR_DEPARTMENT_OTHER): Payer: 59 | Admitting: General Surgery

## 2022-09-08 DIAGNOSIS — Z8614 Personal history of Methicillin resistant Staphylococcus aureus infection: Secondary | ICD-10-CM | POA: Diagnosis not present

## 2022-09-08 DIAGNOSIS — T8131XD Disruption of external operation (surgical) wound, not elsewhere classified, subsequent encounter: Secondary | ICD-10-CM | POA: Diagnosis not present

## 2022-09-08 DIAGNOSIS — L59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: Secondary | ICD-10-CM | POA: Diagnosis not present

## 2022-09-08 DIAGNOSIS — S21002D Unspecified open wound of left breast, subsequent encounter: Secondary | ICD-10-CM | POA: Diagnosis not present

## 2022-09-08 DIAGNOSIS — D0512 Intraductal carcinoma in situ of left breast: Secondary | ICD-10-CM | POA: Diagnosis not present

## 2022-09-08 NOTE — Progress Notes (Signed)
[name], [name] ([id number]) [id number] Page 1 of 2 Visit Report for 09/04/2022 HBO Details Patient Name: Date of Service: [name], [name] 09/04/2022 8:00 A M Medical Record Number: [id number] Patient Account Number: [id number] Date of Birth/Sex: Treating RN: 17-Jan-1971 (52 y.o. 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 HBO Treatment Details Treatment Number: 23 Patient Type: Outpatient Chamber Type: Monoplace Chamber Serial #: [id number] Treatment Protocol: 2.0 ATA with 90 minutes oxygen, and no air breaks Treatment Details Compression Rate Down: 1.5 psi / minute De-Compression Rate Up: 1.5 psi / minute Air breaks and breathing Decompress Decompress Compress Tx Pressure Begins Reached periods Begins Ends (leave unused spaces blank) Chamber Pressure (ATA 1 2 ------2 1 ) Clock Time (24 hr) 08:02 08:12 - - - - - - 09:42 09:52 Treatment Length: 110 (minutes) Treatment Segments: 4 Vital Signs Capillary Blood Glucose Reference Range: 80 - 120 mg / dl HBO Diabetic Blood Glucose Intervention Range: <131 mg/dl or >249 mg/dl Type: Time Vitals Blood Respiratory Capillary Blood Glucose Pulse Action Pulse: Temperature: Taken: Pressure: Rate: Glucose (mg/dl): Meter #: Oximetry (%) Taken: Pre 07:57 124/75 80 20 97.7 none per protocol Post 09:56 129/78 76 18 96.7 none per protocol Treatment Response Treatment Toleration: Well Treatment Completion Status: Treatment Completed without Adverse Event Treatment Notes Patient arrived and prepared for treatment. Vital signs were normal. After  performing safety check, patient was placed in the chamber which was compressed at 1 psi/min until patient confirmed normal ear equalization. Rate set was then increased to 1.5 psi/min and compression continued at that rate until reaching treatment depth of 2 ATA. Patient tolerated treatment and subsequent decompression at the rate of 1.5 psi/min. Patient was stable upon discharge. Physician HBO Attestation: I certify that I supervised this HBO treatment in accordance with Medicare guidelines. A trained emergency response team is readily available per Yes hospital policies and procedures. Continue HBOT as ordered. Yes Electronic Signature(s) Signed: 09/07/2022 9:27:53 AM By: [name] MD FACS Previous Signature: 09/04/2022 3:36:14 PM Version By: [name] MD FACS Previous Signature: 09/04/2022 11:18:13 AM Version By: Donavan Burnet CHT EMT BS , , Previous Signature: 09/04/2022 11:17:45 AM Version By: Donavan Burnet CHT EMT BS , , Previous Signature: 09/04/2022 11:09:45 AM Version By: Donavan Burnet CHT EMT BS , , Entered By: [name] on 09/07/2022 09:27:53 [name] ([id number].pdf Page 2 of 2 -------------------------------------------------------------------------------- HBO Safety Checklist Details Patient Name: Date of Service: [name], [name] 09/04/2022 8:00 A M Medical Record Number: [id number] Patient Account Number: [id number] Date of Birth/Sex: Treating RN: 1971/04/16 (53 y.o. [name] Primary Care Desirey Keahey: [name] Other Clinician: Donavan Burnet Referring Briton Sellman: Treating Neria Procter/Extender: Shea Stakes in Treatment: 19 HBO Safety Checklist Items Safety Checklist Consent Form Signed Patient voided / foley secured and emptied When did you last eato 0715 Last dose of injectable or oral agent n/a Ostomy pouch emptied and vented if applicable NA All implantable  devices assessed, documented and approved NA Intravenous access site secured and place NA Valuables secured Linens and cotton and cotton/polyester blend (less than 51% polyester) Personal oil-based products / skin lotions / body lotions removed Wigs or hairpieces removed NA Smoking or tobacco materials removed  NA Books / newspapers / magazines / loose paper removed Cologne, aftershave, perfume and deodorant removed Jewelry removed (may wrap wedding band) Make-up removed Hair care products removed Battery operated devices (external) removed Heating patches and chemical warmers removed Titanium eyewear removed Nail polish cured greater than 10 hours greater than 10 hours Casting material cured greater than 10 hours NA Hearing aids removed NA Loose dentures or partials removed NA Prosthetics have been removed NA Patient demonstrates correct use of air break device (if applicable) Patient concerns have been addressed Patient grounding bracelet on and cord attached to chamber Specifics for Inpatients (complete in addition to above) Medication sheet sent with patient NA Intravenous medications needed or due during therapy sent with patient NA Drainage tubes (e.g. nasogastric tube or chest tube secured and vented) NA Endotracheal or Tracheotomy tube secured NA Cuff deflated of air and inflated with saline NA Airway suctioned NA Notes Paper version used prior to treatment start. Electronic Signature(s) Signed: 09/04/2022 10:42:34 AM By: Donavan Burnet CHT EMT BS , , Entered By: Donavan Burnet on 09/04/2022 10:42:34

## 2022-09-09 ENCOUNTER — Encounter (HOSPITAL_BASED_OUTPATIENT_CLINIC_OR_DEPARTMENT_OTHER): Payer: 59 | Admitting: General Surgery

## 2022-09-09 DIAGNOSIS — T8131XD Disruption of external operation (surgical) wound, not elsewhere classified, subsequent encounter: Secondary | ICD-10-CM | POA: Diagnosis not present

## 2022-09-09 DIAGNOSIS — L59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: Secondary | ICD-10-CM | POA: Diagnosis not present

## 2022-09-09 DIAGNOSIS — Z8614 Personal history of Methicillin resistant Staphylococcus aureus infection: Secondary | ICD-10-CM | POA: Diagnosis not present

## 2022-09-09 DIAGNOSIS — C50912 Malignant neoplasm of unspecified site of left female breast: Secondary | ICD-10-CM | POA: Diagnosis not present

## 2022-09-09 DIAGNOSIS — T8131XA Disruption of external operation (surgical) wound, not elsewhere classified, initial encounter: Secondary | ICD-10-CM | POA: Diagnosis not present

## 2022-09-09 DIAGNOSIS — S21002D Unspecified open wound of left breast, subsequent encounter: Secondary | ICD-10-CM | POA: Diagnosis not present

## 2022-09-09 DIAGNOSIS — D0512 Intraductal carcinoma in situ of left breast: Secondary | ICD-10-CM | POA: Diagnosis not present

## 2022-09-09 NOTE — Progress Notes (Signed)
AURIELLE, HENDLEY ([id number]) [id number] Page 1 of 1 Visit Report for 09/03/2022 SuperBill Details Patient Name: Date of Service: [name] 09/03/2022 Medical Record Number: [id number] Patient Account Number: [id number] Date of Birth/Sex: Treating RN: 31-Jan-1971 (52 y.o. 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 Facility Procedures CPT4 Code Description Modifier Quantity [id number]-(Facility Use Only) HBOT full body chamber, 20mn , 2 ICD-10 Diagnosis Description 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 S21.002D Unspecified open wound of left breast, subsequent encounter T81.31XD Disruption of external operation (surgical) wound, not elsewhere classified, subsequent encounter 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 Physician Procedures Quantity CPT4 Code Description Modifier [id number] PHYS HYPERBARIC OXYGEN THERAPY 1 ICD-10 Diagnosis Description 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

## 2022-09-09 NOTE — Progress Notes (Signed)
NUR, GOSSMAN ([id number]) [phone].pdf Page 1 of 2 Visit Report for 09/03/2022 Arrival Information Details Patient Name: Date of Service: [name] [name] Mile. 09/03/2022 8:00 A M Medical Record Number: [id number] Patient Account Number: [id number] Date of Birth/Sex: Treating RN: 07-19-1970 (52 y.o. F) Zochol, Jamie Primary Care [name] [name]: [name] Other Clinician: Donavan Burnet Referring [name] [name]: Treating [name] [name]/Extender: Shea Stakes in Treatment: 60 Visit Information History Since Last Visit All ordered tests and consults were completed: Yes Patient Arrived: Ambulatory Added or deleted any medications: No Arrival Time: 07:38 Any new allergies or adverse reactions: No Accompanied By: self Had a fall or experienced change in No Transfer Assistance: None activities of daily living that may affect Patient Identification Verified: Yes risk of falls: Secondary Verification Process Completed: Yes Signs or symptoms of abuse/neglect since last visito No Patient Requires Transmission-Based Precautions: No Hospitalized since last visit: No Patient Has Alerts: No Implantable device outside of the clinic excluding No cellular tissue based products placed in the center since last visit: Pain Present Now: No Electronic Signature(s) Signed: 09/03/2022 11:09:15 AM By: Donavan Burnet CHT EMT BS , , Entered By: Donavan Burnet on 09/03/2022 11:09:15 -------------------------------------------------------------------------------- Encounter Discharge Information Details Patient Name: Date of Service: [name] [name] W. 09/03/2022 8:00 A M Medical Record Number: [id number] Patient Account Number: [id number] Date of Birth/Sex: Treating RN: 1970-10-25 (52 y.o. [name] [name] Primary Care [name] [name]: [name] Other Clinician: Donavan Burnet Referring [name] [name]: Treating Jamaury Gumz/Extender: Shea Stakes in Treatment: 45 Encounter Discharge Information Items Discharge Condition: Stable Ambulatory Status: Ambulatory Discharge Destination: Home Transportation: Private Auto Accompanied By: self Schedule Follow-up Appointment: No Clinical Summary of Care: Electronic Signature(s) Signed: 09/03/2022 11:14:25 AM By: Donavan Burnet CHT EMT BS , , Previous Signature: 09/03/2022 11:13:47 AM Version By: Donavan Burnet CHT EMT BS , , Entered By: Donavan Burnet on 09/03/2022 11:14:25 -------------------------------------------------------------------------------- Vitals Details Patient Name: Date of Service: [name] [name] W. 09/03/2022 8:00 A M Medical Record Number: [id number] Patient Account Number: [id number] Date of Birth/Sex: Treating RN: 11-09-1970 (52 y.o. [name] [name], Jamie Primary Care [name] [name]: [name] Other Clinician: Donavan Burnet Referring Carsen Leaf: Treating Artur Winningham/Extender: [name], [name] [name] in Treatment: 18 Vital Signs Time Taken: 07:53 Temperature (F): 97.2 [name] [name], [name] [name] ([id number]) [id number] Page 2 of 2 Height (in): 63 Pulse (bpm): 93 Weight (lbs): 170 Respiratory Rate (breaths/min): 18 Body Mass Index (BMI): 30.1 Blood Pressure (mmHg): 144/82 Reference Range: 80 - 120 mg / dl Electronic Signature(s) Signed: 09/03/2022 11:09:47 AM By: Donavan Burnet CHT EMT BS , , Entered By: Donavan Burnet on 09/03/2022 11:09:47

## 2022-09-09 NOTE — Progress Notes (Signed)
ONELLA, PRIOLEAU ([id number]) [phone].pdf Page 1 of 1 Visit Report for 09/08/2022 Arrival Information Details Patient Name: Date of Service: [name], [name] 09/08/2022 8:00 A M Medical Record Number: [id number] Patient Account Number: [id number] Date of Birth/Sex: Treating RN: 04/21/1971 (53 y.o. [name] Primary Care Jullianna Gabor: [name] Other Clinician: Donavan Burnet Referring Darris Staiger: Treating Jaquel Coomer/Extender: Shea Stakes in Treatment: 73 Visit Information History Since Last Visit All ordered tests and consults were completed: Yes Patient Arrived: Ambulatory Added or deleted any medications: No Arrival Time: 07:36 Any new allergies or adverse reactions: No Accompanied By: self Had a fall or experienced change in No Transfer Assistance: None activities of daily living that may affect Patient Identification Verified: Yes risk of falls: Secondary Verification Process Completed: Yes Signs or symptoms of abuse/neglect since last visito No Patient Requires Transmission-Based Precautions: No Hospitalized since last visit: No Patient Has Alerts: No Implantable device outside of the clinic excluding No cellular tissue based products placed in the center since last visit: Pain Present Now: No Electronic Signature(s) Signed: 09/08/2022 12:05:42 PM By: Donavan Burnet CHT EMT BS , , Entered By: Donavan Burnet on 09/08/2022 12:05:42 -------------------------------------------------------------------------------- [place] Details Patient Name: Date of Service: [name], [name] 09/08/2022 8:00 A M Medical Record Number: [id number] Patient Account Number: [id number] Date of Birth/Sex: Treating RN: 09-Oct-1970 (52 y.o. [name] Primary Care Anvika Gashi: [name] Other Clinician: Donavan Burnet Referring Herschell Virani: Treating Aerith Canal/Extender: [name], [name] Weeks in Treatment:  62 Vital Signs Time Taken: 07:46 Temperature (F): 96.8 Height (in): 63 Pulse (bpm): 64 Weight (lbs): 170 Respiratory Rate (breaths/min): 18 Body Mass Index (BMI): 30.1 Blood Pressure (mmHg): 133/94 Reference Range: 80 - 120 mg / dl Electronic Signature(s) Signed: 09/08/2022 12:06:26 PM By: Donavan Burnet CHT EMT BS , , Entered By: Donavan Burnet on 09/08/2022 12:06:25

## 2022-09-09 NOTE — Progress Notes (Signed)
CARLESIA, KABLE ([id number]) [id number] Page 1 of 2 Visit Report for 09/03/2022 HBO Details Patient Name: Date of Service: [name] [name] Mile. 09/03/2022 8:00 A M Medical Record Number: [id number] Patient Account Number: [id number] Date of Birth/Sex: Treating RN: 1970-07-29 (52 y.o. 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 HBO Treatment Details Treatment Number: 22 Patient Type: Outpatient Chamber Type: Monoplace Chamber Serial #: [id number] Treatment Protocol: 2.0 ATA with 90 minutes oxygen, and no air breaks Treatment Details Compression Rate Down: 1.0 psi / minute De-Compression Rate Up: 1.5 psi / minute Air breaks and breathing Decompress Decompress Compress Tx Pressure Begins Reached periods Begins Ends (leave unused spaces blank) Chamber Pressure (ATA 1 2 ------2 1 ) Clock Time (24 hr) 08:49 08:04 - - - - - - 09:34 09:48 Treatment Length: 59 (minutes) Treatment Segments: 2 Vital Signs Capillary Blood Glucose Reference Range: 80 - 120 mg / dl HBO Diabetic Blood Glucose Intervention Range: <131 mg/dl or >249 mg/dl Time Vitals Blood Respiratory Capillary Blood Glucose Pulse Action Type: Pulse: Temperature: Taken: Pressure: Rate: Glucose (mg/dl): Meter #: Oximetry (%) Taken: Pre 07:53 144/82 93 18 97.2 Post 09:51 115/75 79 18 97.5 Treatment Response Treatment Toleration: Well Treatment Completion Status: Treatment Completed without Adverse Event Physician HBO Attestation: I certify that I supervised this HBO treatment in accordance with Medicare guidelines. A trained  emergency response team is readily available per Yes hospital policies and procedures. Continue HBOT as ordered. Yes Electronic Signature(s) Signed: 09/03/2022 12:29:00 PM By: [name] MD FACS Previous Signature: 09/03/2022 11:12:32 AM Version By: Donavan Burnet CHT EMT BS , , Entered By: [name] on 09/03/2022 12:28:59 -------------------------------------------------------------------------------- HBO Safety Checklist Details Patient Name: Date of Service: [name] [name], [name] W. 09/03/2022 8:00 A M Medical Record Number: [id number] Patient Account Number: [id number] Date of Birth/Sex: Treating RN: [birth year] (52 y.o. [name] [name] Primary Care Almendra Loria: [name] Other Clinician: Donavan Burnet Referring Latese Dufault: Treating Desi Carby/Extender: Rella Larve [place], [name] ([id number]) [id number] Page 2 of 2 Weeks in Treatment: 18 HBO Safety Checklist Items Safety Checklist Consent Form Signed Patient voided / foley secured and emptied When did you last eato 0715 Last dose of injectable or oral agent N/A Ostomy pouch emptied and vented if applicable NA All implantable devices assessed, documented and approved NA Intravenous access site secured and place NA Valuables secured Linens and cotton and cotton/polyester blend (less than 51% polyester) Personal oil-based products / skin lotions / body lotions removed Wigs or hairpieces removed NA Smoking or tobacco materials removed NA Books / newspapers / magazines / loose paper removed Cologne, aftershave, perfume and deodorant removed Jewelry removed (may wrap wedding band) Make-up removed Hair care products removed Battery operated devices (external) removed Heating patches and chemical warmers removed Titanium eyewear removed Nail polish cured greater than 10 hours greater than 10 hours Casting material cured greater than 10 hours NA Hearing aids  removed NA Loose dentures or partials removed NA Prosthetics have been removed NA Patient demonstrates correct use of air break device (if applicable) Patient concerns have been addressed Patient grounding bracelet on and cord attached to chamber Specifics for Inpatients (complete in addition to above) Medication sheet sent with patient NA Intravenous medications needed  or due during therapy sent with patient NA Drainage tubes (e.g. nasogastric tube or chest tube secured and vented) NA Endotracheal or Tracheotomy tube secured NA Cuff deflated of air and inflated with saline NA Airway suctioned NA Notes Paper version used prior to treatment start. Electronic Signature(s) Signed: 09/03/2022 11:11:14 AM By: Donavan Burnet CHT EMT BS , , Entered By: Donavan Burnet on 09/03/2022 11:11:14

## 2022-09-10 ENCOUNTER — Encounter (HOSPITAL_BASED_OUTPATIENT_CLINIC_OR_DEPARTMENT_OTHER): Payer: 59 | Admitting: General Surgery

## 2022-09-10 DIAGNOSIS — S21002D Unspecified open wound of left breast, subsequent encounter: Secondary | ICD-10-CM | POA: Diagnosis not present

## 2022-09-10 DIAGNOSIS — D0512 Intraductal carcinoma in situ of left breast: Secondary | ICD-10-CM | POA: Diagnosis not present

## 2022-09-10 DIAGNOSIS — L59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: Secondary | ICD-10-CM | POA: Diagnosis not present

## 2022-09-10 DIAGNOSIS — Z8614 Personal history of Methicillin resistant Staphylococcus aureus infection: Secondary | ICD-10-CM | POA: Diagnosis not present

## 2022-09-10 DIAGNOSIS — T8131XD Disruption of external operation (surgical) wound, not elsewhere classified, subsequent encounter: Secondary | ICD-10-CM | POA: Diagnosis not present

## 2022-09-10 NOTE — Progress Notes (Signed)
JOLANA, HASSER ([id number]) [phone].pdf Page 1 of 1 Visit Report for 09/08/2022 SuperBill Details Patient Name: Date of Service: [name] 09/08/2022 Medical Record Number: [id number] Patient Account Number: [id number] Date of Birth/Sex: Treating RN: 11/30/1970 (52 y.o. 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 Facility Procedures CPT4 Code Description Modifier Quantity [id number] G0277-(Facility Use Only) HBOT full body chamber, 83mn , 4 ICD-10 Diagnosis Description 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 S21.002D Unspecified open wound of left breast, subsequent encounter T81.31XD Disruption of external operation (surgical) wound, not elsewhere classified, subsequent encounter 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 Physician Procedures Quantity CPT4 Code Description Modifier [id number] PHYS HYPERBARIC OXYGEN THERAPY 1 ICD-10 Diagnosis Description 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

## 2022-09-10 NOTE — Progress Notes (Signed)
[name], [name] ([id number]) [id number] Page 1 of 1 Visit Report for 09/09/2022 SuperBill Details Patient Name: Date of Service: [name] 09/09/2022 Medical Record Number: [id number] Patient Account Number: [id number] Date of Birth/Sex: Treating RN: [birth year] (52 y.o. 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 Facility Procedures CPT4 Code Description Modifier Quantity [id number]-(Facility Use Only) HBOT full body chamber, 14mn , 4 ICD-10 Diagnosis Description 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 S21.002D Unspecified open wound of left breast, subsequent encounter T81.31XD Disruption of external operation (surgical) wound, not elsewhere classified, subsequent encounter 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 Physician Procedures Quantity CPT4 Code Description Modifier [id number] PHYS HYPERBARIC OXYGEN THERAPY 1 ICD-10 Diagnosis Description 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

## 2022-09-10 NOTE — Progress Notes (Signed)
DESI, FRED ([id number]) [id number] Page 1 of 2 Visit Report for 09/09/2022 Arrival Information Details Patient Name: Date of Service: [name], [name] 09/09/2022 8:00 A M Medical Record Number: [id number] Patient Account Number: [id number] Date of Birth/Sex: Treating RN: 11/05/1970 (52 y.o. [name] Vansh Reckart: [name] Other Clinician: Donavan Burnet Referring Avrohom Mckelvin: Treating Madylyn Insco/Extender: Shea Stakes in Treatment: 27 Visit Information History Since Last Visit All ordered tests and consults were completed: Yes Patient Arrived: Ambulatory Added or deleted any medications: No Arrival Time: 07:42 Any new allergies or adverse reactions: No Accompanied By: self Had a fall or experienced change in No Transfer Assistance: None activities of daily living that may affect Patient Identification Verified: Yes risk of falls: Secondary Verification Process Completed: Yes Signs or symptoms of abuse/neglect since last visito No Patient Requires Transmission-Based Precautions: No Hospitalized since last visit: No Patient Has Alerts: No Implantable device outside of the clinic excluding No cellular tissue based products placed in the center since last visit: Pain Present Now: No Electronic Signature(s) Signed: 09/09/2022 9:36:32 AM By: Donavan Burnet CHT EMT BS , , Entered By: Donavan Burnet on 09/09/2022 09:36:32 -------------------------------------------------------------------------------- Encounter Discharge Information Details Patient Name: Date of Service: [name], [place] W. 09/09/2022 8:00 A M Medical Record Number: [id number] Patient Account Number: [id number] Date of Birth/Sex: Treating RN: April 01, 1971 (52 y.o. [name] Dijon Kohlman: [name] Other Clinician: Donavan Burnet Referring Gurshan Settlemire: Treating Holton Sidman/Extender: Shea Stakes in Treatment: 67 Encounter Discharge Information Items Discharge Condition: Stable Ambulatory Status: Ambulatory Discharge Destination: Other (Note Required) Transportation: Other Accompanied By: staff Schedule Follow-up Appointment: No Clinical Summary of Care: Notes Wound Care Encounter after treatment. Electronic Signature(s) Signed: 09/09/2022 10:48:17 AM By: Donavan Burnet CHT EMT BS , , Entered By: Donavan Burnet on 09/09/2022 10:48:17 -------------------------------------------------------------------------------- Vitals Details Patient Name: Date of Service: [name], [place] W. 09/09/2022 8:00 A M Medical Record Number: [id number] Patient Account Number: [id number] Date of Birth/Sex: Treating RN: [birth year] (52 y.o. [name] Yuniel Blaney: [name] Other Clinician: Donavan Burnet Referring Daeshon Grammatico: Treating Joselinne Lawal/Extender: Shea Stakes in Treatment: [address], [place] ([id number]) [id number] Page 2 of 2 Vital Signs Time Taken: 07:53 Temperature (F): 96.6 Height (in): 63 Pulse (bpm): 72 Weight (lbs): 170 Respiratory Rate (breaths/min): 20 Body Mass Index (BMI): 30.1 Blood Pressure (mmHg): 127/87 Reference Range: 80 - 120 mg / dl Electronic Signature(s) Signed: 09/09/2022 9:37:41 AM By: Donavan Burnet CHT EMT BS , , Entered By: Donavan Burnet on 09/09/2022 09:37:41

## 2022-09-10 NOTE — Progress Notes (Signed)
SHRITHA, HEIDEMAN ([id number]) [id number] Page 1 of 2 Visit Report for 09/09/2022 HBO Details Patient Name: Date of Service: [name], [name] 09/09/2022 8:00 A M Medical Record Number: [id number] Patient Account Number: [id number] Date of Birth/Sex: Treating RN: 11-Feb-1971 (52 y.o. 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 HBO Treatment Details Treatment Number: 25 Patient Type: Outpatient Chamber Type: Monoplace Chamber Serial #: [id number] Treatment Protocol: 2.0 ATA with 90 minutes oxygen, and no air breaks Treatment Details Compression Rate Down: 1.5 psi / minute De-Compression Rate Up: 2.0 psi / minute Air breaks and breathing Decompress Decompress Compress Tx Pressure Begins Reached periods Begins Ends (leave unused spaces blank) Chamber Pressure (ATA 1 2 ------2 1 ) Clock Time (24 hr) 07:57 08:08 - - - - - - 09:38 09:46 Treatment Length: 109 (minutes) Treatment Segments: 4 Vital Signs Capillary Blood Glucose Reference Range: 80 - 120 mg / dl HBO Diabetic Blood Glucose Intervention Range: <131 mg/dl or >249 mg/dl Type: Time Vitals Blood Respiratory Capillary Blood Glucose Pulse Action Pulse: Temperature: Taken: Pressure: Rate: Glucose (mg/dl): Meter #: Oximetry (%) Taken: Pre 07:53 127/87 72 20 96.6 none per protocol Post 09:53 122/86 70 18 97.2 none per protocol Treatment Response Treatment Toleration: Well Treatment Completion Status: Treatment Completed without Adverse Event Treatment Notes Mrs. Terman arrived, prepared for treatment, vital signs were normal. After  performing safety check, patient was safely placed in the chamber which was compressed with 100% oxygen at a rate of 1 psi/min until confirming normal ear equalization and chamber reached 8 psig at which time rate set was increased to 2 psi/min. Patient tolerated treatment and subsequent decompression of the chamber at 2 psi/min. Patient was stable upon discharge. Patient has wound care encounter after treatment. Physician HBO Attestation: I certify that I supervised this HBO treatment in accordance with Medicare guidelines. A trained emergency response team is readily available per Yes hospital policies and procedures. Continue HBOT as ordered. Yes Electronic Signature(s) Signed: 09/09/2022 12:56:07 PM By: [name] MD FACS Previous Signature: 09/09/2022 10:46:21 AM Version By: Donavan Burnet CHT EMT BS , , Previous Signature: 09/09/2022 9:42:21 AM Version By: Donavan Burnet CHT EMT BS , , Entered By: [name] on 09/09/2022 12:56:06 [name] ([id number].pdf Page 2 of 2 -------------------------------------------------------------------------------- HBO Safety Checklist Details Patient Name: Date of Service: [name], [name] 09/09/2022 8:00 A M Medical Record Number: [id number] Patient Account Number: [id number] Date of Birth/Sex: Treating RN: [birth year] (52 y.o. Donney Rankins, [name] Primary Care Izack Hoogland: [name] Other Clinician: Donavan Burnet Referring Won Kreuzer: Treating Sanah Kraska/Extender: Shea Stakes in Treatment: 19 HBO Safety Checklist Items Safety Checklist Consent Form Signed Patient voided / foley secured and emptied When did you last eato Yesterday Last dose of injectable or oral agent N/A Ostomy pouch emptied and vented if applicable NA All implantable devices assessed, documented and approved NA Intravenous access site secured and place NA Valuables secured Linens and  cotton and cotton/polyester blend (less than 51% polyester) Personal oil-based products / skin lotions / body lotions removed Wigs or hairpieces removed NA Smoking or tobacco materials removed NA Books / newspapers / magazines / loose paper removed Cologne, aftershave, perfume and deodorant removed Jewelry removed (  may wrap wedding band) Make-up removed Hair care products removed Battery operated devices (external) removed Heating patches and chemical warmers removed Titanium eyewear removed Nail polish cured greater than 10 hours NA Casting material cured greater than 10 hours NA Hearing aids removed NA Loose dentures or partials removed NA Prosthetics have been removed NA Patient demonstrates correct use of air break device (if applicable) Patient concerns have been addressed Patient grounding bracelet on and cord attached to chamber Specifics for Inpatients (complete in addition to above) Medication sheet sent with patient NA Intravenous medications needed or due during therapy sent with patient NA Drainage tubes (e.g. nasogastric tube or chest tube secured and vented) NA Endotracheal or Tracheotomy tube secured NA Cuff deflated of air and inflated with saline NA Airway suctioned NA Notes Paper version used prior to treatment start. Electronic Signature(s) Signed: 09/09/2022 9:38:53 AM By: Donavan Burnet CHT EMT BS , , Entered By: Donavan Burnet on 09/09/2022 09:38:53

## 2022-09-10 NOTE — Progress Notes (Signed)
NOLA, MOHS ([id number]) [id number] Page 1 of 2 Visit Report for 09/08/2022 HBO Details Patient Name: Date of Service: [name], [name] 09/08/2022 8:00 A M Medical Record Number: [id number] Patient Account Number: [id number] Date of Birth/Sex: Treating RN: Jun 07, 1971 (52 y.o. 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 HBO Treatment Details Treatment Number: 24 Patient Type: Outpatient Chamber Type: Monoplace Chamber Serial #: [id number] Treatment Protocol: 2.0 ATA with 90 minutes oxygen, and no air breaks Treatment Details Compression Rate Down: 2.0 psi / minute De-Compression Rate Up: 2.0 psi / minute Air breaks and breathing Decompress Decompress Compress Tx Pressure Begins Reached periods Begins Ends (leave unused spaces blank) Chamber Pressure (ATA 1 2 ------2 1 ) Clock Time (24 hr) 07:50 07:59 - - - - - - 09:29 09:36 Treatment Length: 106 (minutes) Treatment Segments: 4 Vital Signs Capillary Blood Glucose Reference Range: 80 - 120 mg / dl HBO Diabetic Blood Glucose Intervention Range: <131 mg/dl or >249 mg/dl Time Vitals Blood Respiratory Capillary Blood Glucose Pulse Action Type: Pulse: Temperature: Taken: Pressure: Rate: Glucose (mg/dl): Meter #: Oximetry (%) Taken: Pre 07:46 133/94 64 18 96.8 Post 09:37 133/86 70 18 98.8 Treatment Response Treatment Toleration: Well Treatment Completion Status: Treatment Completed without Adverse Event Treatment Notes Patient arrived and prepared for treatment. Vital signs were normal. After performing safety check, patient was  placed in the chamber which was compressed at 1 psi/min until patient confirmed normal ear equalization. Rate set was then increased to 2 psi/min and compression continued at that rate until reaching treatment depth of 2 ATA. Patient tolerated treatment and subsequent decompression at the rate of 1.5 psi/min. Patient was stable upon discharge. Physician HBO Attestation: I certify that I supervised this HBO treatment in accordance with Medicare guidelines. A trained emergency response team is readily available per Yes hospital policies and procedures. Continue HBOT as ordered. Yes Electronic Signature(s) Signed: 09/09/2022 7:57:40 AM By: [name] Maudlin MD FACS Previous Signature: 09/08/2022 12:10:37 PM Version By: [name] CHT EMT BS , , Entered By: [name] on 09/09/2022 07:57:40 HBO Safety Checklist Details -------------------------------------------------------------------------------- [name] ([id number].pdf Page 2 of 2 Patient Name: Date of Service: [name], [name] 09/08/2022 8:00 A M Medical Record Number: [id number] Patient Account Number: [id number] Date of Birth/Sex: Treating RN: 15-Jul-1970 (52 y.o. [name] Primary Care [name]: [name] Other Clinician: Donavan [name] Referring [name]: Treating [name]/Extender: [name] in Treatment: 19 HBO Safety Checklist Items Safety Checklist Consent Form Signed Patient voided / foley secured and emptied When did you last eato Yesterday Last dose of injectable or oral agent n/a Ostomy pouch emptied and vented if applicable NA All implantable devices assessed, documented and approved NA Intravenous access site secured and place NA Valuables secured Linens and cotton and cotton/polyester blend (less than 51% polyester) Personal oil-based products / skin lotions / body lotions removed Wigs or hairpieces  removed NA Smoking or tobacco materials removed NA Books / newspapers / magazines / loose paper removed Cologne, aftershave, perfume and deodorant removed Jewelry removed (may wrap wedding band) Make-up removed Hair care products removed Battery operated devices (external) removed Heating patches and chemical warmers removed Titanium eyewear removed Nail polish cured  greater than 10 hours greater than 10 hours Casting material cured greater than 10 hours NA Hearing aids removed NA Loose dentures or partials removed NA Prosthetics have been removed NA Patient demonstrates correct use of air break device (if applicable) Patient concerns have been addressed Patient grounding bracelet on and cord attached to chamber Specifics for Inpatients (complete in addition to above) Medication sheet sent with patient NA Intravenous medications needed or due during therapy sent with patient NA Drainage tubes (e.g. nasogastric tube or chest tube secured and vented) NA Endotracheal or Tracheotomy tube secured NA Cuff deflated of air and inflated with saline NA Airway suctioned NA Notes Paper version used prior to treatment start. Electronic Signature(s) Signed: 09/08/2022 12:07:30 PM By: [name] CHT EMT BS , , Entered By: [name] on 09/08/2022 12:07:30

## 2022-09-11 ENCOUNTER — Encounter (HOSPITAL_BASED_OUTPATIENT_CLINIC_OR_DEPARTMENT_OTHER): Payer: 59 | Admitting: General Surgery

## 2022-09-11 DIAGNOSIS — L59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: Secondary | ICD-10-CM | POA: Diagnosis not present

## 2022-09-11 DIAGNOSIS — T8131XD Disruption of external operation (surgical) wound, not elsewhere classified, subsequent encounter: Secondary | ICD-10-CM | POA: Diagnosis not present

## 2022-09-11 DIAGNOSIS — S21002D Unspecified open wound of left breast, subsequent encounter: Secondary | ICD-10-CM | POA: Diagnosis not present

## 2022-09-11 DIAGNOSIS — D0512 Intraductal carcinoma in situ of left breast: Secondary | ICD-10-CM | POA: Diagnosis not present

## 2022-09-11 DIAGNOSIS — Z8614 Personal history of Methicillin resistant Staphylococcus aureus infection: Secondary | ICD-10-CM | POA: Diagnosis not present

## 2022-09-11 NOTE — Progress Notes (Signed)
SHWANA, BRAGGS ([id number]) [id number] Page 1 of 2 Visit Report for 09/10/2022 HBO Details Patient Name: Date of Service: [name], [name] 09/10/2022 8:00 A M Medical Record Number: [id number] Patient Account Number: [id number] Date of Birth/Sex: Treating RN: 23-Dec-1970 (52 y.o. 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 HBO Treatment Details Treatment Number: 26 Patient Type: Outpatient Chamber Type: Monoplace Chamber Serial #: [id number] Treatment Protocol: 2.0 ATA with 90 minutes oxygen, and no air breaks Treatment Details Compression Rate Down: 2.0 psi / minute De-Compression Rate Up: 2.0 psi / minute Air breaks and breathing Decompress Decompress Compress Tx Pressure Begins Reached periods Begins Ends (leave unused spaces blank) Chamber Pressure (ATA 1 2 ------2 1 ) Clock Time (24 hr) 08:00 08:08 - - - - - - 09:38 09:46 Treatment Length: 106 (minutes) Treatment Segments: 4 Vital Signs Capillary Blood Glucose Reference Range: 80 - 120 mg / dl HBO Diabetic Blood Glucose Intervention Range: <131 mg/dl or >249 mg/dl Type: Time Vitals Blood Respiratory Capillary Blood Glucose Pulse Action Pulse: Temperature: Taken: Pressure: Rate: Glucose (mg/dl): Meter #: Oximetry (%) Taken: Pre 07:57 100/71 72 20 98.6 none per protocol Post 09:49 126/62 77 20 98.4 none per protocol Treatment Response Treatment Toleration: Well Treatment Completion Status: Treatment Completed without Adverse Event Treatment Notes Mrs. Tejada arrived, prepared for treatment. Her vital signs were normal. After  performing the safety check, patient was safely placed in the chamber which was compressed with 100% oxygen at a rate of 2 psi/min after confirming normal ear equalization. Patient tolerated treatment and the following decompression of the chamber at a rate of 2 psi/min. Patient was stable upon discharge. Physician HBO Attestation: I certify that I supervised this HBO treatment in accordance with Medicare guidelines. A trained emergency response team is readily available per Yes hospital policies and procedures. Continue HBOT as ordered. Yes Electronic Signature(s) Signed: 09/10/2022 1:03:07 PM By: [name] MD FACS Previous Signature: 09/10/2022 11:37:21 AM Version By: Donavan Burnet CHT EMT BS , , Previous Signature: 09/10/2022 11:09:51 AM Version By: Donavan Burnet CHT EMT BS , , Previous Signature: 09/10/2022 8:59:24 AM Version By: Donavan Burnet CHT EMT BS , , Entered By: [name] on 09/10/2022 13:03:06 [name] ([id number].pdf Page 2 of 2 -------------------------------------------------------------------------------- HBO Safety Checklist Details Patient Name: Date of Service: [name], [name] 09/10/2022 8:00 A M Medical Record Number: [id number] Patient Account Number: [id number] Date of Birth/Sex: Treating RN: Dec 19, 1970 (52 y.o. F) Zochol, Jamie Primary Care Kelisha Dall: [name] Other Clinician: Donavan Burnet Referring Lanessa Shill: Treating Mujahid Jalomo/Extender: Shea Stakes in Treatment: 19 HBO Safety Checklist Items Safety Checklist Consent Form Signed Patient voided / foley secured and emptied When did you last eato 0730 Last dose of injectable or oral agent n/a Ostomy pouch emptied and vented if applicable NA All implantable devices assessed, documented and approved NA Intravenous access site secured and place NA Valuables secured Linens and cotton and cotton/polyester blend  (less than 51% polyester) Personal oil-based products / skin lotions / body lotions removed Wigs or hairpieces removed NA Smoking or tobacco materials removed NA Books / newspapers / magazines / loose paper removed Cologne, aftershave, perfume and deodorant removed Jewelry removed (may wrap  wedding band) Make-up removed NA Hair care products removed Battery operated devices (external) removed Heating patches and chemical warmers removed Titanium eyewear removed Nail polish cured greater than 10 hours greater than 10 hours Casting material cured greater than 10 hours NA Hearing aids removed NA Loose dentures or partials removed NA Prosthetics have been removed NA Patient demonstrates correct use of air break device (if applicable) Patient concerns have been addressed Patient grounding bracelet on and cord attached to chamber Specifics for Inpatients (complete in addition to above) Medication sheet sent with patient NA Intravenous medications needed or due during therapy sent with patient NA Drainage tubes (e.g. nasogastric tube or chest tube secured and vented) NA Endotracheal or Tracheotomy tube secured NA Cuff deflated of air and inflated with saline NA Airway suctioned NA Notes Paper version used prior to treatment start. Electronic Signature(s) Signed: 09/10/2022 8:57:31 AM By: Donavan Burnet CHT EMT BS , , Entered By: Donavan Burnet on 09/10/2022 08:57:31

## 2022-09-11 NOTE — Progress Notes (Signed)
TRICHIA, HIEB ([id number]) [id number] Page 1 of 6 Visit Report for 09/09/2022 Arrival Information Details Patient Name: Date of Service: [name] 09/09/2022 10:00 [place] Record Number: [id number] Patient Account Number: [id number] Date of Birth/Sex: Treating RN: [birth year] (52 y.o. [name] Primary Care Simeon Vera: [name] Other Clinician: Referring Fransisca Shawn: Treating Aveleen Nevers/Extender: Shea Stakes in Treatment: 84 Visit Information History Since Last Visit Added or deleted any medications: No Patient Arrived: Ambulatory Any new allergies or adverse reactions: No Arrival Time: 09:58 Had a fall or experienced change in No Accompanied By: self activities of daily living that may affect Transfer Assistance: None risk of falls: Patient Identification Verified: Yes Signs or symptoms of abuse/neglect since last visito No Secondary Verification Process Completed: Yes Hospitalized since last visit: No Patient Requires Transmission-Based Precautions: No Implantable device outside of the clinic excluding No Patient Has Alerts: No cellular tissue based products placed in the center since last visit: Has Dressing in Place as Prescribed: Yes Pain Present Now: No Electronic Signature(s) Signed: 09/09/2022 4:53:16 PM By: [name] Entered By: [name] on 09/09/2022 09:59:00 -------------------------------------------------------------------------------- Encounter Discharge Information Details Patient Name: Date of Service: [name], [name] Bushy NIE W. 09/09/2022 10:00 [place] Record Number: [id number] Patient Account Number: [id number] Date of Birth/Sex: Treating RN: [birth year] (52 y.o. [name] Primary Care Lorel Lembo: [name] Other Clinician: Referring Maebell Lyvers: Treating Christobal Morado/Extender: Shea Stakes in Treatment: 14 Encounter Discharge  Information Items Post Procedure Vitals Discharge Condition: Stable Temperature (F): 97.2 Ambulatory Status: Ambulatory Pulse (bpm): 70 Discharge Destination: Home Respiratory Rate (breaths/min): 18 Transportation: Private Auto Blood Pressure (mmHg): 122/86 Accompanied By: self Schedule Follow-up Appointment: Yes Clinical Summary of Care: Patient Declined Electronic Signature(s) Signed: 09/09/2022 4:53:16 PM By: [name] Entered By: [name] on 09/09/2022 10:13:11 -------------------------------------------------------------------------------- Lower Extremity Assessment Details Patient Name: Date of Service: [name] Mattes. 09/09/2022 10:00 A M Medical Record Number: [id number] Patient Account Number: [id number] Date of Birth/Sex: Treating RN: 1971-06-03 (52 y.o. [name] Primary Care Satrina Magallanes: [name] Other Clinician: Referring Roiza Wiedel: Treating Cloma Rahrig/Extender: [name], [name] Weeks in Treatment: 19 Electronic Signature(s) Signed: 09/09/2022 4:53:16 PM By: Ignacia Palma ([id number]) [id number] Page 2 of 6 Entered By: [name] on 09/09/2022 09:59:18 -------------------------------------------------------------------------------- Multi Wound Chart Details Patient Name: Date of Service: [name] RDCandie Mile. 09/09/2022 10:00 A M Medical Record Number: [id number] Patient Account Number: [id number] Date of Birth/Sex: Treating RN: [birth year] (52 y.o. F) Primary Care Johnnie Goynes: [name] Other Clinician: Referring Braidan Ricciardi: Treating Mahum Betten/Extender: [name], [name] Weeks in Treatment: 19 Vital Signs Height(in): 63 Pulse(bpm): 70 Weight(lbs): 170 Blood Pressure(mmHg): 122/86 Body Mass Index(BMI): 30.1 Temperature(F): 97.2 Respiratory Rate(breaths/min): 18 [1:Photos:] [N/A:N/A] Left Breast N/A N/A Wound Location: Surgical Injury N/A  N/A Wounding Event: Malignant Wound N/A N/A Primary Etiology: Lymphedema N/A N/A Comorbid History: 04/03/2022 N/A N/A Date Acquired: 40 N/A N/A Weeks of Treatment: Open N/A N/A Wound Status: No N/A N/A Wound Recurrence: 0.8x1.3x0.1 N/A N/A Measurements L x W x D (cm) 0.817 N/A N/A A (cm) : rea 0.082 N/A N/A Volume (cm) : 93.90% N/A N/A % Reduction in A rea: 98.50% N/A N/A % Reduction in Volume: Full Thickness Without Exposed N/A N/A Classification: Support Structures Medium N/A N/A Exudate A mount: Serosanguineous N/A N/A Exudate Type: red, brown N/A N/A Exudate Color: Distinct, outline attached N/A N/A Wound Margin: Medium (34-66%) N/A N/A Granulation A mount: Red, Pink N/A  N/A Granulation Quality: Medium (34-66%) N/A N/A Necrotic A mount: Fat Layer (Subcutaneous Tissue): Yes N/A N/A Exposed Structures: Fascia: No Tendon: No Muscle: No Joint: No Bone: No Medium (34-66%) N/A N/A Epithelialization: Debridement - Selective/Open Wound N/A N/A Debridement: Pre-procedure Verification/Time Out 10:04 N/A N/A Taken: Lidocaine 4% Topical Solution N/A N/A Pain Control: Slough N/A N/A Tissue Debrided: Non-Viable Tissue N/A N/A Level: 1.04 N/A N/A Debridement A (sq cm): rea Curette N/A N/A Instrument: Minimum N/A N/A Bleeding: Pressure N/A N/A Hemostasis A chieved: Procedure was tolerated well N/A N/A Debridement Treatment Response: 0.8x1.3x0.1 N/A N/A Post Debridement Measurements L x W x D (cm) 0.082 N/A N/A Post Debridement Volume: (cm) Induration: Yes N/A N/A Periwound Skin Texture: No Abnormalities Noted N/A N/A Periwound Skin Moisture: No Abnormalities Noted N/A N/A Periwound Skin Color: No Abnormality N/A N/A Temperature: Debridement N/A N/A Procedures Performed: [name], [name] ([id number]) [id number] Page 3 of 6 Treatment Notes Wound #1 (Breast) Wound Laterality: Left Cleanser Normal  Saline Discharge Instruction: Cleanse the wound with Normal Saline prior to applying a clean dressing using gauze sponges, not tissue or cotton balls. Soap and Water Discharge Instruction: May shower and wash wound with dial antibacterial soap and water prior to dressing change. [employer]ram Ancillary Kit - 15 Day Supply Discharge Instruction: Use supplies as instructed; Kit contains: (15) Saline Bullets; (15) 3x3 Gauze; 15 pr Gloves Peri-Wound Care Topical Primary Dressing Promogran Prisma Matrix, 4.34 (sq in) (silver collagen) Discharge Instruction: Moisten collagen with saline or hydrogel Secondary Dressing Zetuvit Plus Silicone Border Dressing 5x5 (in/in) Discharge Instruction: Apply silicone border over primary dressing as directed. Secured With Compression Wrap Compression Stockings Environmental education officer) Signed: 09/09/2022 11:51:16 AM By: [name] MD FACS Entered By: [name] on 09/09/2022 11:51:16 -------------------------------------------------------------------------------- Multi-Disciplinary Care Plan Details Patient Name: Date of Service: [name], [name] Bushy NIE W. 09/09/2022 10:00 [place] Record Number: [id number] Patient Account Number: [id number] Date of Birth/Sex: Treating RN: [birth year] (52 y.o. [name] Primary Care Sukari Grist: [name] Other Clinician: Referring Quint Chestnut: Treating Saeed Toren/Extender: Shea Stakes in Treatment: [place] reviewed with physician Active Inactive Wound/Skin Impairment Nursing Diagnoses: Impaired tissue integrity Goals: Patient/caregiver will verbalize understanding of skin care regimen Date Initiated: 04/24/2022 Target Resolution Date: 10/02/2022 Goal Status: Active Interventions: Assess ulceration(s) every visit Treatment Activities: Skin care regimen initiated : 04/24/2022 Notes: Electronic Signature(s) Signed: 09/09/2022 4:53:16 PM By: Ignacia Palma ([id number]) [id number] Page 4 of 6 Entered By: [name] on 09/09/2022 10:06:08 -------------------------------------------------------------------------------- Pain Assessment Details Patient Name: Date of Service: [name] RDCandie Mile. 09/09/2022 10:00 A M Medical Record Number: [id number] Patient Account Number: [id number] Date of Birth/Sex: Treating RN: 07-11-1970 (52 y.o. [name] Primary Care Skyeler Scalese: [name] Other Clinician: Referring Jeslynn Hollander: Treating Eyana Stolze/Extender: Shea Stakes in Treatment: 55 Active Problems Location of Pain Severity and Description of Pain Patient Has Paino No Site Locations Rate the pain. Current Pain Level: 0 Pain Management and Medication Current Pain Management: Electronic Signature(s) Signed: 09/09/2022 4:53:16 PM By: [name] Entered By: [name] on 09/09/2022 09:59:16 -------------------------------------------------------------------------------- Patient/Caregiver Education Details Patient Name: Date of Service: [name], [name] NIE W. 3/6/2024andnbsp10:00 A M Medical Record Number: [id number] Patient Account Number: [id number] Date of Birth/Gender: Treating RN: 10/17/1970 (52 y.o. [name] Primary Care Physician: [name] Other Clinician: Referring Physician: Treating Physician/Extender: Shea Stakes in Treatment: 33 Education Assessment Education Provided To: Patient Education Topics Provided Hyperbaric Oxygenation:  Methods: Explain/Verbal Responses: Reinforcements needed, State content correctly Electronic Signature(s) Signed: 09/09/2022 4:53:16 PM By: [name] Entered By: [name] on 09/09/2022 10:12:42 [name] ([id number]) [id number] Page 5 of  6 -------------------------------------------------------------------------------- Wound Assessment Details Patient Name: Date of Service: [name], [name] 09/09/2022 10:00 A M Medical Record Number: [id number] Patient Account Number: [id number] Date of Birth/Sex: Treating RN: 03/25/1971 (51 y.o. [name] Primary Care Jessey Huyett: [name] Other Clinician: Referring Tranisha Tissue: Treating Therese Rocco/Extender: [name], [name] Weeks in Treatment: 19 Wound Status Wound Number: 1 Primary Etiology: Malignant Wound Wound Location: Left Breast Wound Status: Open Wounding Event: Surgical Injury Comorbid History: Lymphedema Date Acquired: 04/03/2022 Weeks Of Treatment: 19 Clustered Wound: No Photos Wound Measurements Length: (cm) 0.8 Width: (cm) 1.3 Depth: (cm) 0.1 Area: (cm) 0.817 Volume: (cm) 0.082 % Reduction in Area: 93.9% % Reduction in Volume: 98.5% Epithelialization: Medium (34-66%) Tunneling: No Undermining: No Wound Description Classification: Full Thickness Without Exposed Support Structures Wound Margin: Distinct, outline attached Exudate Amount: Medium Exudate Type: Serosanguineous Exudate Color: red, brown Foul Odor After Cleansing: No Slough/Fibrino Yes Wound Bed Granulation Amount: Medium (34-66%) Exposed Structure Granulation Quality: Red, Pink Fascia Exposed: No Necrotic Amount: Medium (34-66%) Fat Layer (Subcutaneous Tissue) Exposed: Yes Necrotic Quality: Adherent Slough Tendon Exposed: No Muscle Exposed: No Joint Exposed: No Bone Exposed: No Periwound Skin Texture Texture Color No Abnormalities Noted: No No Abnormalities Noted: Yes Induration: Yes Temperature / Pain Temperature: No Abnormality Moisture No Abnormalities Noted: Yes Treatment Notes Wound #1 (Breast) Wound Laterality: Left Cleanser Normal Saline Discharge Instruction: Cleanse the wound with Normal Saline prior to applying a clean dressing using gauze  sponges, not tissue or cotton balls. Soap and Water Discharge Instruction: May shower and wash wound with dial antibacterial soap and water prior to dressing change. AILEAN, KOLODZIEJCZYK ([id number]) [id number] Page 6 of 6 Byram Ancillary Kit - 15 Day Supply Discharge Instruction: Use supplies as instructed; Kit contains: (15) Saline Bullets; (15) 3x3 Gauze; 15 pr Gloves Peri-Wound Care Topical Primary Dressing Promogran Prisma Matrix, 4.34 (sq in) (silver collagen) Discharge Instruction: Moisten collagen with saline or hydrogel Secondary Dressing Zetuvit Plus Silicone Border Dressing 5x5 (in/in) Discharge Instruction: Apply silicone border over primary dressing as directed. Secured With Compression Wrap Compression Stockings Environmental education officer) Signed: 09/09/2022 4:53:16 PM By: [name] Entered By: [name] on 09/09/2022 10:02:10 -------------------------------------------------------------------------------- Vitals Details Patient Name: Date of Service: [name], Berthold W. 09/09/2022 10:00 A M Medical Record Number: [id number] Patient Account Number: [id number] Date of Birth/Sex: Treating RN: 1971-03-27 (52 y.o. [name] Primary Care Reneka Nebergall: [name] Other Clinician: Referring Alvin Rubano: Treating Tinika Bucknam/Extender: [name], [name] Weeks in Treatment: 40 Vital Signs Time Taken: 09:57 Temperature (F): 97.2 Height (in): 63 Pulse (bpm): 70 Weight (lbs): 170 Respiratory Rate (breaths/min): 18 Body Mass Index (BMI): 30.1 Blood Pressure (mmHg): 122/86 Reference Range: 80 - 120 mg / dl Electronic Signature(s) Signed: 09/09/2022 4:53:16 PM By: [name] Entered By: [name] on 09/09/2022 09:59:12

## 2022-09-11 NOTE — Progress Notes (Addendum)
[name] ([id number]) [id number] Page 1 of 9 Visit Report for 09/09/2022 Chief Complaint Document Details Patient Name: Date of Service: [name], [name] 09/09/2022 10:00 A M Medical Record Number: [id number] Patient Account Number: [id number] Date of Birth/Sex: Treating RN: 1971/04/05 (52 y.o. F) Primary Care Provider: Ardean Larsen Other Clinician: Referring Provider: Treating Provider/Extender: Juventino Slovak, [name] Weeks in Treatment: 68 Information Obtained from: Patient Chief Complaint 04/24/2022; patient is here for a surgical wound on her left lateral breast Electronic Signature(s) Signed: 09/09/2022 11:51:22 AM By: Duanne Guess MD FACS Entered By: Duanne Guess on 09/09/2022 11:51:22 -------------------------------------------------------------------------------- Debridement Details Patient Name: Date of Service: [name] [name] 09/09/2022 10:00 A M Medical Record Number: [id number] Patient Account Number: [id number] Date of Birth/Sex: Treating RN: Jun 30, 1971 (52 y.o. [name] Primary Care Provider: Ardean Larsen Other Clinician: Referring Provider: Treating Provider/Extender: Alfonse Ras in Treatment: 19 Debridement Performed for Assessment: Wound #1 Left Breast Performed By: Physician Duanne Guess, MD Debridement Type: Debridement Level of Consciousness (Pre-procedure): Awake and Alert Pre-procedure Verification/Time Out Yes - 10:04 Taken: Start Time: 10:04 Pain Control: Lidocaine 4% T opical Solution T Area Debrided (L x W): otal 0.8 (cm) x 1.3 (cm) = 1.04 (cm) Tissue and other material debrided: Non-Viable, Slough, Slough Level: Non-Viable Tissue Debridement Description: Selective/Open Wound Instrument: Curette Bleeding: Minimum Hemostasis Achieved: Pressure Response to Treatment: Procedure was tolerated well Level of Consciousness (Post- Awake and  Alert procedure): Post Debridement Measurements of Total Wound Length: (cm) 0.8 Width: (cm) 1.3 Depth: (cm) 0.1 Volume: (cm) 0.082 Character of Wound/Ulcer Post Debridement: Improved Post Procedure Diagnosis Same as Pre-procedure [name], [name] ([id number]) [id number] Page 2 of 9 Notes scribed for Dr. Lady Gary by Samuella Bruin, RN Electronic Signature(s) Signed: 09/09/2022 12:55:37 PM By: Duanne Guess MD FACS Signed: 09/09/2022 4:53:16 PM By: Gelene Mink By: Samuella Bruin on 09/09/2022 10:06:01 -------------------------------------------------------------------------------- HPI Details Patient Name: Date of Service: [name], [name] [name] 09/09/2022 10:00 A M Medical Record Number: [id number] Patient Account Number: [id number] Date of Birth/Sex: Treating RN: 1970/11/06 (52 y.o. F) Primary Care Provider: Ardean Larsen Other Clinician: Referring Provider: Treating Provider/Extender: Juventino Slovak, [name] Weeks in Treatment: 65 History of Present Illness HPI Description: ADMISSION 05/25/2022 This is a 52 year old woman who is found to have an abnormal mammogram of her left breast earlier this year showing a 13 mm group of calcifications in the upper quadrant of the left breast. 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 She underwent a left lumpectomy on 11/12/2021. The patient states the wound never really healed and was open. She underwent radiation therapy from 12/10/2021 through 01/19/22 28 fractions of 1.8GY. Essentially the wound would not heal. She was treated several times with antibiotics finally on 03/25/2022 she was taken to the OR by Dr. Donell Beers for excision of the wound in a sinus. Operative cultures grew strep and Prevotella and a culture from the clinic Livedo MRSA. It was recommended for 4 weeks of Augmentin and doxycycline by infectious  disease. She was seen by wound care and she has been treating the wound with Medihoney ever since. 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 She was on meth methotrexate 20 mg once  a week however this I think was put on hold because of the wound followed by Dr. Allena Katz of rheumatology. She has asthma. Most recent breast ultrasound was on 9/7 She has a large nonhealing wound on the lateral aspect of her left breast 04/30/2022: Although I do not have a photograph to compare to last week, the RN report is that it is substantially cleaner. She still has ample slough and nonviable fat and subcutaneous tissue present. She only was able to get her Santyl on Monday so she has just had a couple of days of treatment. 05/08/2022: The wound dimensions are about the same, but the surface is substantially cleaner. There is just a little bit of slough accumulation on the surface. 05/18/2022: The wound is a little bit smaller, but not much. It is quite a bit cleaner, however. 05/25/2022: The wound surface continues to improve. Very little slough accumulation this week. No substantial change in the wound dimensions. 06/01/2022: The wound surface continues to have a layer of slough on it. There is a crack in the surface near the 12 o'clock position that when further explored, revealed a cavity and tunnel that is about 2 cm deep. No purulent drainage or concern for infection. 12/11; left breast wound. The wound itself is somewhat smaller in size per our measurements. However she has a divot at 1:00 that measures about a 1.5 cm deep. We have been using iodoform packing and if it and Santyl to the rest of the wound This wound was initially had a left lumpectomy on 11/12/2021 she  underwent 28 fractions of radiation. She potentially could benefit from hyperbaric oxygen 12/18; left breast wound. Better looking wound surface and improvement in measurements of the small tunnel. We have been using Santyl and iodoform. 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 Underneath the layer of slough, the tissue is much healthier-looking, with better color and some granulation tissue beginning to form. 07/13/2022: The wound continues to fill with better looking granulation tissue. There is very little slough accumulation and the cavity is shallower. We are going to submit for insurance approval for hyperbaric oxygen therapy, as it has now been 6 months since the time of her initial wound and radiation therapy. 07/20/2022: The cavity continues to fill in and the surface of the wound is improving. EKG and chest x-ray are complete and we are just awaiting insurance approval for hyperbarics. 07/27/2022: The cavity has filled in considerably and the surface of the wound is very clean with just a thin layer of light slough. We are still awaiting insurance approval for hyperbaric oxygen therapy. 08/03/2022: She was approved for hyperbaric oxygen therapy and received 2 treatments last week. It is rather remarkable the improvement already in her wound. The cavity continues to fill and there is epithelialization occurring around the edges of her wound. Minimal slough accumulation. 08/13/2022: Her wound is smaller and is epithelializing. There is no longer a tunnel or tract to pack. There is just slight slough accumulation on the surface. 08/19/2022: Her wound continues to contract and epithelialize. Light slough and eschar on the remaining open areas. 08/26/2022: Her wound is smaller by about half this  week. There is still some slough accumulation on the surface. [name], [name] ([id number]) [id number] Page 3 of 9 09/02/2022: There has  been further epithelialization of her wound. It is nearly completely flush with the surrounding skin. Minimal slough accumulation. 09/09/2022: The wound is smaller again this week. There has been circumferential epithelialization. There is slough on the remaining wound surface. Electronic Signature(s) Signed: 09/09/2022 11:51:53 AM By: Duanne Guess MD FACS Entered By: Duanne Guess on 09/09/2022 11:51:53 -------------------------------------------------------------------------------- Physical Exam Details Patient Name: Date of Service: [name] 09/09/2022 10:00 A M Medical Record Number: [id number] Patient Account Number: [id number] Date of Birth/Sex: Treating RN: 1970-11-23 (52 y.o. F) Primary Care Provider: Ardean Larsen Other Clinician: Referring Provider: Treating Provider/Extender: Juventino Slovak, [name] Weeks in Treatment: 19 Constitutional . . . . no acute distress. Respiratory Normal work of breathing on room air. Notes 09/09/2022: The wound is smaller again this week. There has been circumferential epithelialization. There is slough on the remaining wound surface. Electronic Signature(s) Signed: 09/09/2022 11:52:48 AM By: Duanne Guess MD FACS Entered By: Duanne Guess on 09/09/2022 11:52:48 -------------------------------------------------------------------------------- Physician Orders Details Patient Name: Date of Service: [name] 09/09/2022 10:00 A M Medical Record Number: [id number] Patient Account Number: [id number] Date of Birth/Sex: Treating RN: 1970-08-23 (52 y.o. [name] Primary Care Provider: Ardean Larsen Other Clinician: Referring Provider: Treating Provider/Extender: Alfonse Ras in Treatment: 66 Verbal / Phone Orders:  No Diagnosis Coding ICD-10 Coding Code Description S21.002D Unspecified open wound of left breast, subsequent encounter T81.31XD Disruption of external operation (surgical) wound, not elsewhere classified, subsequent encounter 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 Follow-up Appointments ppointment in 1 week. - Dr. Lady Gary Room 2 Return A Anesthetic (In clinic) Topical Lidocaine 4% applied to wound bed Bathing/ Shower/ Hygiene Other Bathing/Shower/Hygiene Orders/Instructions: - Change dressing after bathing BRYANNAH, GOGUE ([id number]) [id number] Page 4 of 9 Edema Control - Lymphedema / SCD / Other Other Edema Control Orders/Instructions: - Keep doing the Lymphadema checks Hyperbaric Oxygen Therapy Evaluate for HBO Therapy Indication: - soft tissue radionecrosis If appropriate for treatment, begin HBOT per protocol: 2.0 ATA for 90 Minutes without A Breaks ir Total Number of Treatments: - 40 One treatments per day (delivered Monday through Friday unless otherwise specified in Special Instructions below): A frin (Oxymetazoline HCL) 0.05% nasal spray - 1 spray in both nostrils daily as needed prior to HBO treatment for difficulty clearing ears Wound Treatment Wound #1 - Breast Wound Laterality: Left Cleanser: Normal Saline (Generic) 1 x Per Day/15 Days Discharge Instructions: Cleanse the wound with Normal Saline prior to applying a clean dressing using gauze sponges, not tissue or cotton balls. Cleanser: Soap and Water 1 x Per Day/15 Days Discharge Instructions: May shower and wash wound with dial antibacterial soap and water prior to dressing change. Cleanser: [place] Ancillary Kit - 15 Day Supply (Generic) 1 x Per Day/15 Days Discharge Instructions: Use supplies as instructed; Kit contains: (15) Saline Bullets; (15) 3x3 Gauze; 15 pr Gloves Prim Dressing:  Promogran Prisma Matrix, 4.34 (sq in) (silver collagen) (Dispense As Written) 1 x Per Day/15 Days ary Discharge Instructions: Moisten collagen with saline or hydrogel Secondary Dressing: Zetuvit Plus Silicone Border Dressing 4x4 (in/in) (DME) (Generic) 1 x Per Day/15 Days Discharge Instructions: Apply silicone border over primary dressing as directed. Patient Medications llergies: kiwi, Ativan, erythromycin base, Xanax, nitrofurantoin A Notifications Medication Indication Start End 09/09/2022 lidocaine DOSE topical 4 % cream - cream topical Electronic Signature(s) Signed: 09/28/2022 5:13:01 PM By: Duanne Guess MD FACS Signed:  01/05/2023 3:04:57 PM By: Samuella Bruin Previous Signature: 09/09/2022 12:55:37 PM Version By: Duanne Guess MD FACS Entered By: Samuella Bruin on 09/23/2022 16:35:48 -------------------------------------------------------------------------------- Problem List Details Patient Name: Date of Service: [name], [name] [name] 09/09/2022 10:00 A M Medical Record Number: [id number] Patient Account Number: [id number] Date of Birth/Sex: Treating RN: Jul 13, 1970 (52 y.o. F) Primary Care Provider: Ardean Larsen Other Clinician: Referring Provider: Treating Provider/Extender: Juventino Slovak, [name] Weeks in Treatment: 104 Active Problems ICD-10 Encounter Code Description Active Date MDM Diagnosis S21.002D Unspecified open wound of left breast, subsequent encounter 04/24/2022 No Yes T81.31XD Disruption of external operation (surgical) wound, not elsewhere classified, 04/24/2022 No Yes subsequent encounter [name], [name] ([id number]) [id number] Page 5 of 9 L59.8 Other specified disorders of the skin and subcutaneous tissue related to 04/24/2022 No Yes radiation 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 04/24/2022 No Yes Inactive Problems Resolved Problems Electronic Signature(s) Signed: 09/09/2022 11:51:03 AM By:  Duanne Guess MD FACS Entered By: Duanne Guess on 09/09/2022 11:51:03 -------------------------------------------------------------------------------- Progress Note Details Patient Name: Date of Service: [name] [name] 09/09/2022 10:00 A M Medical Record Number: [id number] Patient Account Number: [id number] Date of Birth/Sex: Treating RN: 09-Jun-1971 (52 y.o. F) Primary Care Provider: Ardean Larsen Other Clinician: Referring Provider: Treating Provider/Extender: Juventino Slovak, [name] Weeks in Treatment: 49 Subjective Chief Complaint Information obtained from Patient 04/24/2022; patient is here for a surgical wound on her left lateral breast History of Present Illness (HPI) ADMISSION 05/25/2022 This is a 52 year old woman who is found to have an abnormal mammogram of her left breast earlier this year showing a 13 mm group of calcifications in the upper quadrant of the left breast. 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 She underwent a left lumpectomy on 11/12/2021. The patient states the wound never really healed and was open. She underwent radiation therapy from 12/10/2021 through 01/19/22 28 fractions of 1.8GY. Essentially the wound would not heal. She was treated several times with antibiotics finally on 03/25/2022 she was taken to the OR by Dr. Donell Beers for excision of the wound in a sinus. Operative cultures grew strep and Prevotella and a culture from the clinic [place] MRSA. It was recommended for 4 weeks of Augmentin and doxycycline by infectious disease. She was seen by wound care and she has been treating the wound with Medihoney ever since. 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 She was on meth methotrexate 20 mg once a week however this I think was put on hold because of the wound followed by Dr. Allena Katz of rheumatology. She has asthma. Most recent breast ultrasound was on 9/7 She has a large nonhealing wound on the lateral aspect of her left breast 04/30/2022: Although I do not have a photograph to compare to last week, the RN report is that it is substantially cleaner. She still has ample slough and nonviable fat and subcutaneous tissue present. She only was able to get her Santyl on Monday so she has just had a couple of days of treatment. 05/08/2022: The wound dimensions are about the same, but the surface is substantially cleaner. There is just a little bit of slough accumulation on the surface. 05/18/2022: The wound is a little bit  smaller, but not much. It is quite a bit cleaner, however. 05/25/2022: The wound surface continues to improve. Very little slough accumulation this week. No substantial change in the wound dimensions. 06/01/2022: The wound surface continues to have a layer of slough on it. There is a crack in the surface near the 12 o'clock position that when further explored, revealed a cavity and tunnel that is about 2 cm deep. No purulent drainage or concern for infection. 12/11; left breast wound. The wound itself is somewhat smaller in size per our measurements. However she has a divot at 1:00 that measures about a 1.5 cm deep. We have been using iodoform packing and if it and Santyl to the rest of the wound This wound was initially had a left lumpectomy on 11/12/2021 she underwent 28 fractions of radiation. She potentially could benefit from hyperbaric oxygen 12/18; left breast wound. Better looking wound surface and improvement in measurements of the small tunnel. We have been using Santyl and iodoform. 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 Underneath the layer of slough, the tissue [name], [name] ([id number]) [id number] Page 6 of 9 is much healthier-looking, with better color and some granulation tissue beginning to form. 07/13/2022: The wound continues to fill with better looking granulation tissue. There is very little slough accumulation and the cavity is shallower. We are going to submit for insurance approval for hyperbaric oxygen therapy, as it has now been 6 months since the time of her initial wound and radiation therapy. 07/20/2022: The cavity continues to fill in and the surface of the wound is improving. EKG and chest x-ray are complete and we are just awaiting insurance approval for hyperbarics. 07/27/2022: The cavity has filled in considerably and the surface of the wound is very clean with just a thin layer of light slough. We are still awaiting insurance approval for hyperbaric oxygen therapy. 08/03/2022: She was approved for hyperbaric oxygen therapy and received 2 treatments last week. It is rather remarkable the improvement already in her wound. The cavity continues to fill and there is epithelialization occurring around the edges of her wound. Minimal slough accumulation. 08/13/2022: Her wound is smaller and is epithelializing. There is no longer a tunnel or tract to pack. There is just slight slough accumulation on the surface. 08/19/2022: Her wound continues to contract and epithelialize. Light slough and eschar on the remaining open areas. 08/26/2022: Her wound is smaller by about half this week. There is still some slough accumulation on the surface. 09/02/2022: There has been further epithelialization of her wound. It is nearly completely flush with the surrounding skin. Minimal slough accumulation. 09/09/2022: The wound is smaller again this week. There  has been circumferential epithelialization. There is slough on the remaining wound surface. Patient History Information obtained from Patient. Social History Never smoker, Marital Status - Married, Alcohol Use - Moderate, Drug Use - No History, Caffeine Use - Daily. Medical History Hematologic/Lymphatic Patient has history of Lymphedema - Left breast-gets Lymphadema tx Hospitalization/Surgery History - 03/25/22 Left Breast cyst excision; 11/12/21 Left Breast Lumpectomy with Radioactive seeds;Left breat Biopsy;Anterior. 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 12/08/21- 01/29/22- 37 rounds of Radiation Psychiatric General Anxiety Objective  Constitutional no acute distress. Vitals Time Taken: 9:57 AM, Height: 63 in, Weight: 170 lbs, BMI: 30.1, Temperature: 97.2 F, Pulse: 70 bpm, Respiratory Rate: 18 breaths/min, Blood Pressure: 122/86 mmHg. Respiratory Normal work of breathing on room air. General Notes: 09/09/2022: The wound is smaller again this week. There has been circumferential epithelialization. There is slough on the remaining wound surface. Integumentary (Hair, Skin) Wound #1 status is Open. Original cause of wound was Surgical Injury. The date acquired was: 04/03/2022. The wound has been in treatment 19 weeks. The wound is located on the Left Breast. The wound measures 0.8cm length x 1.3cm width x 0.1cm depth; 0.817cm^2 area and 0.082cm^3 volume. There is Fat Layer (Subcutaneous Tissue) exposed. There is no tunneling or undermining noted. There is a medium amount of serosanguineous drainage noted. The wound margin is distinct with the outline attached to the wound base. There is medium (34-66%) red, pink granulation within the wound bed. There is a medium (34-66%) amount of necrotic tissue within the wound  bed including Adherent Slough. The periwound skin appearance had no abnormalities noted for moisture. The periwound skin appearance had no abnormalities noted for color. The periwound skin appearance exhibited: Induration. Periwound temperature was noted as No Abnormality. Assessment [name], [name] ([id number]) [id number] Page 7 of 9 Active Problems ICD-10 Unspecified open wound of left breast, subsequent encounter Disruption of external operation (surgical) wound, not elsewhere classified, subsequent encounter Other specified disorders of the skin and subcutaneous tissue related to radiation Intraductal carcinoma in situ of left breast Procedures Wound #1 Pre-procedure diagnosis of Wound #1 is a Malignant Wound located on the Left Breast . There was a Selective/Open Wound Non-Viable Tissue Debridement with a total area of 1.04 sq cm performed by Duanne Guess, MD. With the following instrument(s): Curette to remove Non-Viable tissue/material. Material removed includes [facility] after achieving pain control using Lidocaine 4% Topical Solution. No specimens were taken. A time out was conducted at 10:04, prior to the start of the procedure. A Minimum amount of bleeding was controlled with Pressure. The procedure was tolerated well. Post Debridement Measurements: 0.8cm length x 1.3cm width x 0.1cm depth; 0.082cm^3 volume. Character of Wound/Ulcer Post Debridement is improved. Post procedure Diagnosis Wound #1: Same as Pre-Procedure General Notes: scribed for Dr. Lady Gary by Samuella Bruin, RN. Plan Follow-up Appointments: Return Appointment in 1 week. - Dr. Lady Gary Room 2 Anesthetic: (In clinic) Topical Lidocaine 4% applied to wound bed Bathing/ Shower/ Hygiene: Other Bathing/Shower/Hygiene Orders/Instructions: - Change dressing after bathing Edema Control - Lymphedema / SCD / Other: Other Edema Control Orders/Instructions: - Keep doing the Lymphadema  checks Hyperbaric Oxygen Therapy: Evaluate for HBO Therapy Indication: - soft tissue radionecrosis If appropriate for treatment, begin HBOT per protocol: 2.0 ATA for 90 Minutes without Air Breaks T Number of Treatments: - 40 otal One treatments per day (delivered Monday through Friday unless otherwise specified in Special Instructions below): Afrin (Oxymetazoline HCL) 0.05% nasal spray - 1 spray in both nostrils daily as needed prior to HBO treatment for difficulty clearing ears The following medication(s) was prescribed: lidocaine topical 4 % cream cream topical was prescribed at facility WOUND #1: - Breast Wound Laterality: Left Cleanser: Normal Saline (Generic) 1 x Per Day/15 Days Discharge Instructions: Cleanse the wound with Normal Saline prior to applying a clean dressing using gauze sponges, not tissue or cotton balls. Cleanser: Soap and Water 1 x Per Day/15 Days Discharge Instructions: May shower and wash wound with dial antibacterial soap and water prior to dressing change. Cleanser:  [employer]ram Ancillary Kit - 15 Day Supply (Generic) 1 x Per Day/15 Days Discharge Instructions: Use supplies as instructed; Kit contains: (15) Saline Bullets; (15) 3x3 Gauze; 15 pr Gloves Prim Dressing: Promogran Prisma Matrix, 4.34 (sq in) (silver collagen) (Dispense As Written) 1 x Per Day/15 Days ary Discharge Instructions: Moisten collagen with saline or hydrogel Secondary Dressing: Zetuvit Plus Silicone Border Dressing 4x4 (in/in) (DME) (Generic) 1 x Per Day/15 Days Discharge Instructions: Apply silicone border over primary dressing as directed. 09/09/2022: The wound is smaller again this week. There has been circumferential epithelialization. There is slough on the remaining wound surface. I used a curette to debride the slough from the wound surface. We will continue Prisma silver collagen with hydrogel. She will continue her hyperbaric oxygen therapy treatments. Follow-up in 1 week. Electronic  Signature(s) Signed: 09/28/2022 4:40:03 PM By: Shawn Stall RN, BSN Signed: 09/28/2022 5:13:01 PM By: Duanne Guess MD FACS Previous Signature: 09/09/2022 11:55:02 AM Version By: Duanne Guess MD FACS Entered By: Shawn Stall on 09/28/2022 14:43:16 [name] ([id number]) [id number] Page 8 of 9 -------------------------------------------------------------------------------- HxROS Details Patient Name: Date of Service: [name], [name] 09/09/2022 10:00 A M Medical Record Number: [id number] Patient Account Number: [id number] Date of Birth/Sex: Treating RN: 04/27/1971 (52 y.o. F) Primary Care Provider: Ardean Larsen Other Clinician: Referring Provider: Treating Provider/Extender: Juventino Slovak, [name] Weeks in Treatment: 28 Information Obtained From Patient Ear/Nose/Mouth/Throat Medical History: Past Medical History Notes: Left ear limited hearing Hematologic/Lymphatic Medical History: Positive for: Lymphedema - Left breast-gets Lymphadema tx Gastrointestinal Medical History: Past Medical History Notes: GERD Immunological Medical History: Past Medical History Notes: Wegener"s Granulomatosis Musculoskeletal Medical History: Past Medical History Notes: "RA like arthritis" as per patient Oncologic Medical History: Past Medical History Notes: Left Breast. 12/08/21- 01/29/22- 37 rounds of Radiation Psychiatric Medical History: Past Medical History Notes: General Anxiety Immunizations Pneumococcal Vaccine: Received Pneumococcal Vaccination: Yes Received Pneumococcal Vaccination On or After 60th Birthday: No Implantable Devices Yes Hospitalization / Surgery History Type of Hospitalization/Surgery 03/25/22 Left Breast cyst excision; 11/12/21 Left Breast Lumpectomy with Radioactive seeds;Left breat Biopsy;Anterior Family and Social History Never smoker; Marital Status - Married; Alcohol Use: Moderate; Drug Use: No History;  Caffeine Use: Daily; Financial Concerns: No; Food, Clothing or Shelter Needs: No; Support System Lacking: No; Transportation Concerns: No Electronic Signature(s) Signed: 09/09/2022 12:55:37 PM By: Duanne Guess MD FACS Entered By: Duanne Guess on 09/09/2022 11:52:17 [name] ([id number]) [id number] Page 9 of 9 -------------------------------------------------------------------------------- SuperBill Details Patient Name: Date of Service: [name] RDDriscilla [name] 09/09/2022 Medical Record Number: [id number] Patient Account Number: [id number] Date of Birth/Sex: Treating RN: [birth year] (52 y.o. 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 Facility Procedures : CPT4 Code: [id number] Description: 97597 - DEBRIDE WOUND 1ST 20 SQ CM OR < ICD-10 Diagnosis Description S21.002D Unspecified open wound of left breast, subsequent encounter Modifier: Quantity: 1 Physician Procedures : CPT4 Code Description Modifier [id number] 99213 - WC PHYS LEVEL 3 - EST PT 25 ICD-10 Diagnosis Description S21.002D Unspecified open wound of left breast, subsequent encounter T81.31XD Disruption of external operation (surgical) wound, not elsewhere  classified, subsequent encounter L59.8 Other specified disorders of the  skin and subcutaneous tissue related to radiation 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

## 2022-09-11 NOTE — Progress Notes (Signed)
KARLINA, BANKEN ([id number]) [phone].pdf Page 1 of 2 Visit Report for 09/10/2022 Arrival Information Details Patient Name: Date of Service: [name], [name] 09/10/2022 8:00 A M Medical Record Number: [id number] Patient Account Number: [id number] Date of Birth/Sex: Treating RN: [birth year] (52 y.o. F) Zochol, Jamie Primary Care Gaile Allmon: [name] Other Clinician: Donavan Burnet Referring Alessa Mazur: Treating Shirle Provencal/Extender: Shea Stakes in Treatment: 69 Visit Information History Since Last Visit All ordered tests and consults were completed: Yes Patient Arrived: Ambulatory Added or deleted any medications: No Arrival Time: 07:41 Any new allergies or adverse reactions: No Accompanied By: self Had a fall or experienced change in No Transfer Assistance: None activities of daily living that may affect Patient Identification Verified: Yes risk of falls: Secondary Verification Process Completed: Yes Signs or symptoms of abuse/neglect since last visito No Patient Requires Transmission-Based Precautions: No Hospitalized since last visit: No Patient Has Alerts: No Implantable device outside of the clinic excluding No cellular tissue based products placed in the center since last visit: Pain Present Now: No Electronic Signature(s) Signed: 09/10/2022 8:55:40 AM By: Donavan Burnet CHT EMT BS , , Entered By: Donavan Burnet on 09/10/2022 08:55:40 -------------------------------------------------------------------------------- Encounter Discharge Information Details Patient Name: Date of Service: [name], [name] 09/10/2022 8:00 A M Medical Record Number: [id number] Patient Account Number: [id number] Date of Birth/Sex: Treating RN: [birth year] (52 y.o. [name] Primary Care Jaxsin Bottomley: [name] Other Clinician: Donavan Burnet Referring Atarah Cadogan: Treating Rosalind Guido/Extender: Shea Stakes in Treatment: 32 Encounter Discharge Information Items Discharge Condition: Stable Ambulatory Status: Ambulatory Discharge Destination: Home Transportation: Private Auto Accompanied By: self Schedule Follow-up Appointment: No Clinical Summary of Care: Electronic Signature(s) Signed: 09/10/2022 11:20:27 AM By: Donavan Burnet CHT EMT BS , , Entered By: Donavan Burnet on 09/10/2022 11:20:27 -------------------------------------------------------------------------------- Vitals Details Patient Name: Date of Service: [name], [name] 09/10/2022 8:00 A M Medical Record Number: [id number] Patient Account Number: [id number] Date of Birth/Sex: Treating RN: [birth year] (52 y.o. [name], Jamie Primary Care Tira Lafferty: [name] Other Clinician: Donavan Burnet Referring Aaniya Sterba: Treating Aubreyanna Dorrough/Extender: [name], [name] Weeks in Treatment: 22 Vital Signs Time Taken: 07:57 Temperature (F): 98.6 [name], [name] ([id number]) [phone].pdf Page 2 of 2 Height (in): 63 Pulse (bpm): 72 Weight (lbs): 170 Respiratory Rate (breaths/min): 20 Body Mass Index (BMI): 30.1 Blood Pressure (mmHg): 100/71 Reference Range: 80 - 120 mg / dl Electronic Signature(s) Signed: 09/10/2022 8:56:11 AM By: Donavan Burnet CHT EMT BS , , Entered By: Donavan Burnet on 09/10/2022 08:56:10

## 2022-09-11 NOTE — Progress Notes (Signed)
[name], [name] ([id number]) [id number] Page 1 of 1 Visit Report for 09/10/2022 SuperBill Details Patient Name: Date of Service: [name] 09/10/2022 Medical Record Number: [id number] Patient Account Number: [id number] Date of Birth/Sex: Treating RN: February 02, 1971 (52 y.o. 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 Facility Procedures CPT4 Code Description Modifier Quantity [id number] G0277-(Facility Use Only) HBOT full body chamber, 63mn , 4 ICD-10 Diagnosis Description 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 S21.002D Unspecified open wound of left breast, subsequent encounter T81.31XD Disruption of external operation (surgical) wound, not elsewhere classified, subsequent encounter 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 Physician Procedures Quantity CPT4 Code Description Modifier [id number] PHYS HYPERBARIC OXYGEN THERAPY 1 ICD-10 Diagnosis Description 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 S21.002D Unspecified open wound of left breast, subsequent encounter T81.31XD Disruption of external operation (surgical) wound, not elsewhere classified, subsequent encounter 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

## 2022-09-12 NOTE — Progress Notes (Signed)
FIAMMA, DELAFIELD ([id number]) [id number] Page 1 of 2 Visit Report for 09/11/2022 Arrival Information Details Patient Name: Date of Service: [name] [name] [place]. 09/11/2022 8:00 A M Medical Record Number: [id number] Patient Account Number: [id number] Date of Birth/Sex: Treating RN: 10/24/1970 (52 y.o. [name] [name] Primary Care Osaze Hubbert: [name] Other Clinician: Donavan Burnet Referring Day Deery: Treating Spiros Greenfeld/Extender: Shea Stakes in Treatment: 78 Visit Information History Since Last Visit All ordered tests and consults were completed: Yes Patient Arrived: Ambulatory Added or deleted any medications: No Arrival Time: 07:40 Any new allergies or adverse reactions: No Accompanied By: self Had a fall or experienced change in No Transfer Assistance: None activities of daily living that may affect Patient Identification Verified: Yes risk of falls: Secondary Verification Process Completed: Yes Signs or symptoms of abuse/neglect since last visito No Patient Requires Transmission-Based Precautions: No Hospitalized since last visit: No Patient Has Alerts: No Implantable device outside of the clinic excluding No cellular tissue based products placed in the center since last visit: Pain Present Now: No Electronic Signature(s) Signed: 09/11/2022 2:22:53 PM By: Donavan Burnet CHT EMT BS , , Entered By: Donavan Burnet on 09/11/2022 14:22:53 -------------------------------------------------------------------------------- Encounter Discharge Information Details Patient Name: Date of Service: [name] [name], [place] W. 09/11/2022 8:00 A M Medical Record Number: [id number] Patient Account Number: [id number] Date of Birth/Sex: Treating RN: [birth year] (52 y.o. [name] [name] Primary Care Raunak Antuna: [name] Other Clinician: Donavan Burnet Referring Fortunato Nordin: Treating Suhan Paci/Extender: Shea Stakes in Treatment: 20 Encounter Discharge Information Items Discharge Condition: Stable Ambulatory Status: Ambulatory Discharge Destination: Home Transportation: Private Auto Accompanied By: self Schedule Follow-up Appointment: No Clinical Summary of Care: Electronic Signature(s) Signed: 09/11/2022 2:26:39 PM By: Donavan Burnet CHT EMT BS , , Entered By: Donavan Burnet on 09/11/2022 14:26:39 -------------------------------------------------------------------------------- Vitals Details Patient Name: Date of Service: [name] [name], [name] W. 09/11/2022 8:00 A M Medical Record Number: [id number] Patient Account Number: [id number] Date of Birth/Sex: Treating RN: 02-18-1971 (52 y.o. [name] [name] Primary Care Karigan Cloninger: [name] Other Clinician: Donavan Burnet Referring Cedric Denison: Treating Loretha Ure/Extender: [name], [name] Weeks in Treatment: 20 Vital Signs Time Taken: 08:04 Temperature (F): 98.8 [name] [name], [name] [name] ([id number]) [phone].pdf Page 2 of 2 Height (in): 63 Pulse (bpm): 80 Weight (lbs): 170 Respiratory Rate (breaths/min): 18 Body Mass Index (BMI): 30.1 Blood Pressure (mmHg): 123/86 Reference Range: 80 - 120 mg / dl Electronic Signature(s) Signed: 09/11/2022 2:23:15 PM By: Donavan Burnet CHT EMT BS , , Entered By: Donavan Burnet on 09/11/2022 14:23:15

## 2022-09-13 NOTE — Progress Notes (Signed)
[name] [name] ([id number]) [id number] Page 1 of 2 Visit Report for 09/11/2022 HBO Details Patient Name: Date of Service: [name] [name] Mile. 09/11/2022 8:00 A M Medical Record Number: [id number] Patient Account Number: [id number] Date of Birth/Sex: Treating RN: 1970-10-23 (52 y.o. 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 HBO Treatment Details Treatment Number: 27 Patient Type: Outpatient Chamber Type: Monoplace Chamber Serial #: [id number] Treatment Protocol: 2.0 ATA with 90 minutes oxygen, and no air breaks Treatment Details Compression Rate Down: 1.5 psi / minute De-Compression Rate Up: 2.0 psi / minute Air breaks and breathing Decompress Decompress Compress Tx Pressure Begins Reached periods Begins Ends (leave unused spaces blank) Chamber Pressure (ATA 1 2 ------2 1 ) Clock Time (24 hr) 08:08 08:19 - - - - - - 09:49 09:57 Treatment Length: 109 (minutes) Treatment Segments: 4 Vital Signs Capillary Blood Glucose Reference Range: 80 - 120 mg / dl HBO Diabetic Blood Glucose Intervention Range: <131 mg/dl or >249 mg/dl Type: Time Vitals Blood Respiratory Capillary Blood Glucose Pulse Action Pulse: Temperature: Taken: Pressure: Rate: Glucose (mg/dl): Meter #: Oximetry (%) Taken: Pre 08:04 123/86 80 18 98.8 none per protocol Post 10:00 121/76 72 18 96.4 none per protocol Treatment Response Treatment Toleration: Well Treatment Completion Status: Treatment Completed without Adverse Event Treatment Notes Patient arrived, prepared for treatment. Vital signs were normal. After  performing safety check, patient was placed in the chamber which was compressed with 100% oxygen at a rate of 2 psi/min after confirming normal ear equalization until reaching treatment depth of 2 ATA. Patient tolerated treatment and subsequent decompression of the chamber at a rate of 2 psi/min. Patient was stable upon discharge. Physician HBO Attestation: I certify that I supervised this HBO treatment in accordance with Medicare guidelines. A trained emergency response team is readily available per Yes hospital policies and procedures. Continue HBOT as ordered. Yes Electronic Signature(s) Signed: 09/11/2022 4:25:30 PM By: [name] MD FACS Previous Signature: 09/11/2022 2:25:54 PM Version By: Donavan Burnet CHT EMT BS , , Previous Signature: 09/11/2022 8:56:26 AM Version By: Donavan Burnet CHT EMT BS , , Entered By: [name] on 09/11/2022 16:25:29 HBO Safety Checklist Details -------------------------------------------------------------------------------- [name] [name] ([id number].pdf Page 2 of 2 Patient Name: Date of Service: [name] [name] Mile. 09/11/2022 8:00 A M Medical Record Number: [id number] Patient Account Number: [id number] Date of Birth/Sex: Treating RN: 03-Nov-1970 (52 y.o. [name] Primary Care Maytal Mijangos: [name] Other Clinician: Donavan Burnet Referring Junaid Wurzer: Treating Bracha Frankowski/Extender: [name], [name] Weeks in Treatment: 20 HBO Safety Checklist Items Safety Checklist Consent Form Signed Patient voided / foley secured and emptied When did you last eato 0530 Last dose of injectable or oral agent n/a Ostomy pouch emptied and vented if applicable NA All implantable devices assessed, documented and approved NA Intravenous access site secured and place NA Valuables secured Linens and cotton and cotton/polyester blend (less than 51% polyester) Personal oil-based products / skin  lotions / body lotions removed Wigs or hairpieces removed NA Smoking or tobacco materials removed NA Books / newspapers / magazines / loose paper removed Cologne, aftershave, perfume and deodorant removed Jewelry removed (may wrap wedding band) Make-up removed Hair care products removed Battery operated devices (external)  removed Heating patches and chemical warmers removed Titanium eyewear removed Nail polish cured greater than 10 hours greater than 10 hours Casting material cured greater than 10 hours NA Hearing aids removed NA Loose dentures or partials removed NA Prosthetics have been removed NA Patient demonstrates correct use of air break device (if applicable) Patient concerns have been addressed Patient grounding bracelet on and cord attached to chamber Specifics for Inpatients (complete in addition to above) Medication sheet sent with patient NA Intravenous medications needed or due during therapy sent with patient NA Drainage tubes (e.g. nasogastric tube or chest tube secured and vented) NA Endotracheal or Tracheotomy tube secured NA Cuff deflated of air and inflated with saline NA Airway suctioned NA Notes Paper version used prior to treatment start. Electronic Signature(s) Signed: 09/11/2022 2:23:46 PM By: Donavan Burnet CHT EMT BS , , Previous Signature: 09/11/2022 8:55:12 AM Version By: Donavan Burnet CHT EMT BS , , Entered By: Donavan Burnet on 09/11/2022 14:23:46

## 2022-09-13 NOTE — Progress Notes (Signed)
NICK, FRONHEISER ([id number]) [id number] Page 1 of 1 Visit Report for 09/11/2022 SuperBill Details Patient Name: Date of Service: [name] 09/11/2022 Medical Record Number: [id number] Patient Account Number: [id number] Date of Birth/Sex: Treating RN: Jan 28, 1971 (52 y.o. 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 Facility Procedures CPT4 Code Description Modifier Quantity [id number] G0277-(Facility Use Only) HBOT full body chamber, 83mn , 4 ICD-10 Diagnosis Description 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 S21.002D Unspecified open wound of left breast, subsequent encounter T81.31XD Disruption of external operation (surgical) wound, not elsewhere classified, subsequent encounter 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 Physician Procedures Quantity CPT4 Code Description Modifier [id number] PHYS HYPERBARIC OXYGEN THERAPY 1 ICD-10 Diagnosis Description 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 S21.002D Unspecified open wound of left breast, subsequent encounter T81.31XD Disruption of external operation (surgical) wound, not elsewhere classified, subsequent encounter 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

## 2022-09-14 ENCOUNTER — Encounter (HOSPITAL_BASED_OUTPATIENT_CLINIC_OR_DEPARTMENT_OTHER): Payer: 59 | Admitting: General Surgery

## 2022-09-14 DIAGNOSIS — S21002D Unspecified open wound of left breast, subsequent encounter: Secondary | ICD-10-CM | POA: Diagnosis not present

## 2022-09-14 DIAGNOSIS — Z8614 Personal history of Methicillin resistant Staphylococcus aureus infection: Secondary | ICD-10-CM | POA: Diagnosis not present

## 2022-09-14 DIAGNOSIS — L59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: Secondary | ICD-10-CM | POA: Diagnosis not present

## 2022-09-14 DIAGNOSIS — D0512 Intraductal carcinoma in situ of left breast: Secondary | ICD-10-CM | POA: Diagnosis not present

## 2022-09-14 DIAGNOSIS — T8131XD Disruption of external operation (surgical) wound, not elsewhere classified, subsequent encounter: Secondary | ICD-10-CM | POA: Diagnosis not present

## 2022-09-14 NOTE — Progress Notes (Addendum)
[name], [name] ([id number]) [id number] Page 1 of 2 Visit Report for 09/14/2022 Arrival Information Details Patient Name: Date of Service: [name], [name] 09/14/2022 8:00 A M Medical Record Number: [id number] Patient Account Number: [id number] Date of Birth/Sex: Treating RN: 09/28/1970 (52 y.o. [name], Meta.Reding Primary Care Jannet Calip: [name] Other Clinician: Donavan Burnet Referring Marcell Chavarin: Treating Tamar Miano/Extender: Shea Stakes in Treatment: 86 Visit Information History Since Last Visit All ordered tests and consults were completed: Yes Patient Arrived: Ambulatory Added or deleted any medications: No Arrival Time: 07:30 Any new allergies or adverse reactions: No Accompanied By: self Had a fall or experienced change in No Transfer Assistance: None activities of daily living that may affect Patient Identification Verified: Yes risk of falls: Secondary Verification Process Completed: Yes Signs or symptoms of abuse/neglect since last visito No Patient Requires Transmission-Based Precautions: No Hospitalized since last visit: No Patient Has Alerts: No Implantable device outside of the clinic excluding No cellular tissue based products placed in the center since last visit: Pain Present Now: No Electronic Signature(s) Signed: 09/14/2022 8:53:43 AM By: Donavan Burnet CHT EMT BS , , Entered By: Donavan Burnet on 09/14/2022 08:53:42 -------------------------------------------------------------------------------- Encounter Discharge Information Details Patient Name: Date of Service: [name] [name], [name] 09/14/2022 8:00 A M Medical Record Number: [id number] Patient Account Number: [id number] Date of Birth/Sex: Treating RN: 10/21/1970 (52 y.o. [name] Primary Care Calina Patrie: [name] Other Clinician: Donavan Burnet Referring Aqeel Norgaard: Treating Mckensey Berghuis/Extender: Shea Stakes in Treatment: 20 Encounter Discharge Information Items Discharge Condition: Stable Ambulatory Status: Ambulatory Discharge Destination: Home Transportation: Private Auto Accompanied By: self Schedule Follow-up Appointment: No Clinical Summary of Care: Electronic Signature(s) Signed: 09/14/2022 10:38:04 AM By: Donavan Burnet CHT EMT BS , , Entered By: Donavan Burnet on 09/14/2022 10:38:04 -------------------------------------------------------------------------------- Vitals Details Patient Name: Date of Service: [name] [name], [name] 09/14/2022 8:00 A M Medical Record Number: [id number] Patient Account Number: [id number] Date of Birth/Sex: Treating RN: Mar 09, 1971 (52 y.o. [name] Primary Care Martavia Tye: [name] Other Clinician: Donavan Burnet Referring Kela Baccari: Treating Cheyane Ayon/Extender: [name], [name] Weeks in Treatment: 20 Vital Signs Time Taken: 07:38 Temperature (F): 97.2 [name], [name] ([id number]) [id number] Page 2 of 2 Height (in): 63 Pulse (bpm): 67 Weight (lbs): 170 Respiratory Rate (breaths/min): 20 Body Mass Index (BMI): 30.1 Blood Pressure (mmHg): 125/78 Reference Range: 80 - 120 mg / dl Electronic Signature(s) Signed: 09/14/2022 10:34:18 AM By: Donavan Burnet CHT EMT BS , , Previous Signature: 09/14/2022 8:55:07 AM Version By: Donavan Burnet CHT EMT BS , , Entered By: Donavan Burnet on 09/14/2022 10:34:18

## 2022-09-15 ENCOUNTER — Encounter (HOSPITAL_BASED_OUTPATIENT_CLINIC_OR_DEPARTMENT_OTHER): Payer: 59 | Admitting: General Surgery

## 2022-09-15 DIAGNOSIS — D0512 Intraductal carcinoma in situ of left breast: Secondary | ICD-10-CM | POA: Diagnosis not present

## 2022-09-15 DIAGNOSIS — L59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: Secondary | ICD-10-CM | POA: Diagnosis not present

## 2022-09-15 DIAGNOSIS — Z8614 Personal history of Methicillin resistant Staphylococcus aureus infection: Secondary | ICD-10-CM | POA: Diagnosis not present

## 2022-09-15 DIAGNOSIS — T8131XD Disruption of external operation (surgical) wound, not elsewhere classified, subsequent encounter: Secondary | ICD-10-CM | POA: Diagnosis not present

## 2022-09-15 DIAGNOSIS — S21002D Unspecified open wound of left breast, subsequent encounter: Secondary | ICD-10-CM | POA: Diagnosis not present

## 2022-09-15 NOTE — Progress Notes (Signed)
[name], [name] ([id number]) [id number] Page 1 of 2 Visit Report for 09/14/2022 HBO Details Patient Name: Date of Service: [name], [name] 09/14/2022 8:00 A M Medical Record Number: [id number] Patient Account Number: [id number] Date of Birth/Sex: Treating RN: [birth year] (52 y.o. 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 HBO Treatment Details Treatment Number: 28 Patient Type: Outpatient Chamber Type: Monoplace Chamber Serial #: [id number] Treatment Protocol: 2.0 ATA with 90 minutes oxygen, and no air breaks Treatment Details Compression Rate Down: 1.5 psi / minute De-Compression Rate Up: 2.0 psi / minute Air breaks and breathing Decompress Decompress Compress Tx Pressure Begins Reached periods Begins Ends (leave unused spaces blank) Chamber Pressure (ATA 1 2 ------2 1 ) Clock Time (24 hr) 07:39 07:55 - - - - - - 09:25 09:33 Treatment Length: 114 (minutes) Treatment Segments: 4 Vital Signs Capillary Blood Glucose Reference Range: 80 - 120 mg / dl HBO Diabetic Blood Glucose Intervention Range: <131 mg/dl or >249 mg/dl Type: Time Vitals Blood Respiratory Capillary Blood Glucose Pulse Action Pulse: Temperature: Taken: Pressure: Rate: Glucose (mg/dl): Meter #: Oximetry (%) Taken: Pre 07:38 125/78 67 20 97.2 none per protocol Post 09:35 124/57 74 18 97.7 none per protocol Treatment Response Treatment Toleration: Well Treatment Completion Status: Treatment Completed without Adverse Event Treatment Notes Patient arrived, prepared for treatment. Vital signs were normal. After  performing safety check, patient was placed in the chamber which was compressed with 100% oxygen at a rate of 2 psi/min after confirming normal ear equalization. Mrs. Ventrice tolerated treatment and decompression of the chamber at 2 psi/min. She denied any symptoms of barotrauma stating that she felt good. Patient was stable upon discharge. Physician HBO Attestation: I certify that I supervised this HBO treatment in accordance with Medicare guidelines. A trained emergency response team is readily available per Yes hospital policies and procedures. Continue HBOT as ordered. Yes Electronic Signature(s) Signed: 09/15/2022 7:42:29 AM By: [name] MD FACS Previous Signature: 09/14/2022 10:37:19 AM Version By: Donavan Burnet CHT EMT BS , , Entered By: [name] on 09/15/2022 07:42:29 HBO Safety Checklist Details -------------------------------------------------------------------------------- [name] ([id number].pdf Page 2 of 2 Patient Name: Date of Service: [name], [name] 09/14/2022 8:00 A M Medical Record Number: [id number] Patient Account Number: [id number] Date of Birth/Sex: Treating RN: [birth year] (53 y.o. [name], Meta.Reding Primary Care Lucille Crichlow: [name] Other Clinician: Donavan Burnet Referring Kaisley Stiverson: Treating Alexxis Mackert/Extender: [name], [name] Weeks in Treatment: 20 HBO Safety Checklist Items Safety Checklist Consent Form Signed Patient voided / foley secured and emptied When did you last eato Yesterday Last dose of injectable or oral agent n/a Ostomy pouch emptied and vented if applicable NA All implantable devices assessed, documented and approved NA Intravenous access site secured and place NA Valuables secured Linens and cotton and cotton/polyester blend (less than 51% polyester) Personal oil-based products / skin lotions / body lotions removed Wigs or hairpieces removed NA Smoking or  tobacco materials removed NA Books / newspapers / magazines / loose paper removed Cologne, aftershave, perfume and deodorant removed Jewelry removed (may wrap wedding band) Make-up removed Hair care products removed Battery operated devices (external) removed Heating patches and chemical warmers removed Titanium eyewear removed Nail polish cured  greater than 10 hours greater than 10 hours Casting material cured greater than 10 hours NA Hearing aids removed NA Loose dentures or partials removed NA Prosthetics have been removed NA Patient demonstrates correct use of air break device (if applicable) Patient concerns have been addressed Patient grounding bracelet on and cord attached to chamber Specifics for Inpatients (complete in addition to above) Medication sheet sent with patient NA Intravenous medications needed or due during therapy sent with patient NA Drainage tubes (e.g. nasogastric tube or chest tube secured and vented) NA Endotracheal or Tracheotomy tube secured NA Cuff deflated of air and inflated with saline NA Airway suctioned NA Notes Paper version used prior to treatment start. Electronic Signature(s) Signed: 09/14/2022 8:56:13 AM By: Donavan Burnet CHT EMT BS , , Entered By: Donavan Burnet on 09/14/2022 08:56:13

## 2022-09-15 NOTE — Progress Notes (Signed)
LADISLAVA, PREGLER ([id number]) [id number] Page 1 of 1 Visit Report for 09/15/2022 SuperBill Details Patient Name: Date of Service: [name] 09/15/2022 Medical Record Number: [id number] Patient Account Number: [id number] Date of Birth/Sex: Treating RN: 08-02-1970 (52 y.o. 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 Facility Procedures CPT4 Code Description Modifier Quantity [id number]-(Facility Use Only) HBOT full body chamber, 17mn , 4 ICD-10 Diagnosis Description 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 S21.002D Unspecified open wound of left breast, subsequent encounter T81.31XD Disruption of external operation (surgical) wound, not elsewhere classified, subsequent encounter 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 Physician Procedures Quantity CPT4 Code Description Modifier [id number] PHYS HYPERBARIC OXYGEN THERAPY 1 ICD-10 Diagnosis Description 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

## 2022-09-15 NOTE — Progress Notes (Signed)
KEELEIGH, GILLSON ([id number]) [id number] Page 1 of 1 Visit Report for 09/14/2022 SuperBill Details Patient Name: Date of Service: [name], [name] 09/14/2022 Medical Record Number: [id number] Patient Account Number: [id number] Date of Birth/Sex: Treating RN: Feb 14, 1971 (52 y.o. 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 Facility Procedures CPT4 Code Description Modifier Quantity [id number] G0277-(Facility Use Only) HBOT full body chamber, 48mn , 4 ICD-10 Diagnosis Description 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 S21.002D Unspecified open wound of left breast, subsequent encounter T81.31XD Disruption of external operation (surgical) wound, not elsewhere classified, subsequent encounter 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 Physician Procedures Quantity CPT4 Code Description Modifier [id number] PHYS HYPERBARIC OXYGEN THERAPY 1 ICD-10 Diagnosis Description 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

## 2022-09-15 NOTE — Progress Notes (Signed)
DAFFNEY, COLL ([id number]) [id number] Page 1 of 2 Visit Report for 09/15/2022 Arrival Information Details Patient Name: Date of Service: [name] [name] [name] 09/15/2022 8:00 A M Medical Record Number: [id number] Patient Account Number: [id number] Date of Birth/Sex: Treating RN: 01-10-1971 (52 y.o. [name], Lauren Primary Care Wallis Spizzirri: [name] Other Clinician: Donavan Burnet Referring Nashla Althoff: Treating Cordarrel Stiefel/Extender: Shea Stakes in Treatment: 82 Visit Information History Since Last Visit All ordered tests and consults were completed: Yes Patient Arrived: Ambulatory Added or deleted any medications: No Arrival Time: 07:40 Any new allergies or adverse reactions: No Accompanied By: self Had a fall or experienced change in No Transfer Assistance: None activities of daily living that may affect Patient Identification Verified: Yes risk of falls: Secondary Verification Process Completed: Yes Signs or symptoms of abuse/neglect since last visito No Patient Requires Transmission-Based Precautions: No Hospitalized since last visit: No Patient Has Alerts: No Implantable device outside of the clinic excluding No cellular tissue based products placed in the center since last visit: Pain Present Now: No Electronic Signature(s) Signed: 09/15/2022 12:12:42 PM By: Donavan Burnet CHT EMT BS , , Entered By: Donavan Burnet on 09/15/2022 12:12:41 -------------------------------------------------------------------------------- Encounter Discharge Information Details Patient Name: Date of Service: [name] Pigg [name], [name] W. 09/15/2022 8:00 A M Medical Record Number: [id number] Patient Account Number: [id number] Date of Birth/Sex: Treating RN: 01-05-1971 (52 y.o. [name], Lauren Primary Care Martice Doty: [name] Other Clinician: Donavan Burnet Referring Yarden Hillis: Treating Laurance Heide/Extender: Shea Stakes in Treatment: 20 Encounter Discharge Information Items Discharge Condition: Stable Ambulatory Status: Ambulatory Discharge Destination: Home Transportation: Private Auto Accompanied By: self Schedule Follow-up Appointment: No Clinical Summary of Care: Electronic Signature(s) Signed: 09/15/2022 12:16:34 PM By: Donavan Burnet CHT EMT BS , , Entered By: Donavan Burnet on 09/15/2022 12:16:33 -------------------------------------------------------------------------------- [place] Details Patient Name: Date of Service: [name] Pigg [name], [name] W. 09/15/2022 8:00 A M Medical Record Number: [id number] Patient Account Number: [id number] Date of Birth/Sex: Treating RN: 1971-02-11 (52 y.o. [name], Lauren Primary Care Balin Vandegrift: [name] Other Clinician: Donavan Burnet Referring Armanie Martine: Treating Daniyal Tabor/Extender: [name], [name] Weeks in Treatment: 20 Vital Signs Time Taken: 07:58 Temperature (F): 97.2 [name] [name], [name] [name] ([id number]) [id number] Page 2 of 2 Height (in): 63 Pulse (bpm): 76 Weight (lbs): 170 Respiratory Rate (breaths/min): 18 Body Mass Index (BMI): 30.1 Blood Pressure (mmHg): 113/77 Reference Range: 80 - 120 mg / dl Electronic Signature(s) Signed: 09/15/2022 12:13:04 PM By: Donavan Burnet CHT EMT BS , , Entered By: Donavan Burnet on 09/15/2022 12:13:04

## 2022-09-15 NOTE — Progress Notes (Signed)
DANYLE, NICHOLL ([id number]) [id number] Page 1 of 2 Visit Report for 09/15/2022 HBO Details Patient Name: Date of Service: [name], [name] 09/15/2022 8:00 A M Medical Record Number: [id number] Patient Account Number: [id number] Date of Birth/Sex: Treating RN: [birth year] (52 y.o. 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 HBO Treatment Details Treatment Number: 29 Patient Type: Outpatient Chamber Type: Monoplace Chamber Serial #: [id number] Treatment Protocol: 2.0 ATA with 90 minutes oxygen, and no air breaks Treatment Details Compression Rate Down: 1.5 psi / minute De-Compression Rate Up: 2.0 psi / minute Air breaks and breathing Decompress Decompress Compress Tx Pressure Begins Reached periods Begins Ends (leave unused spaces blank) Chamber Pressure (ATA 1 2 ------2 1 ) Clock Time (24 hr) 08:03 08:18 - - - - - - 09:48 09:56 Treatment Length: 113 (minutes) Treatment Segments: 4 Vital Signs Capillary Blood Glucose Reference Range: 80 - 120 mg / dl HBO Diabetic Blood Glucose Intervention Range: <131 mg/dl or >249 mg/dl Type: Time Vitals Blood Respiratory Capillary Blood Glucose Pulse Action Pulse: Temperature: Taken: Pressure: Rate: Glucose (mg/dl): Meter #: Oximetry (%) Taken: Pre 07:58 113/77 76 18 97.2 none per protocol Post 09:57 113/87 76 18 97.3 none per protocol Treatment Response Treatment Toleration: Well Treatment Completion Status: Treatment Completed without Adverse Event Treatment Notes [name] arrived, prepared for treatment. Vital signs were normal. After  performing safety check, patient was placed in the chamber which was compressed with 100% oxygen at a rate of 2 psi/min after confirming normal ear equalization. Patient tolerated treatment and decompression of the chamber at 2 psi/min. She denied any issues with ear equalization and/or pain. Patient was stable upon discharge. Physician HBO Attestation: I certify that I supervised this HBO treatment in accordance with Medicare guidelines. A trained emergency response team is readily available per Yes hospital policies and procedures. Continue HBOT as ordered. Yes Electronic Signature(s) Signed: 09/15/2022 1:20:15 PM By: [name] MD FACS Previous Signature: 09/15/2022 12:15:49 PM Version By: Donavan Burnet CHT EMT BS , , Entered By: [name] on 09/15/2022 13:20:15 HBO Safety Checklist Details -------------------------------------------------------------------------------- [name] ([id number].pdf Page 2 of 2 Patient Name: Date of Service: [name], [name] 09/15/2022 8:00 A M Medical Record Number: [id number] Patient Account Number: [id number] Date of Birth/Sex: Treating RN: May 22, 1971 (52 y.o. [name], Lauren Primary Care Carsin Randazzo: [name] Other Clinician: Donavan Burnet Referring Mahlia Fernando: Treating Destin Vinsant/Extender: [name], [name] Weeks in Treatment: 20 HBO Safety Checklist Items Safety Checklist Consent Form Signed Patient voided / foley secured and emptied When did you last eato yesterday Last dose of injectable or oral agent n/a Ostomy pouch emptied and vented if applicable NA All implantable devices assessed, documented and approved NA Intravenous access site secured and place NA Valuables secured Linens and cotton and cotton/polyester blend (less than 51% polyester) Personal oil-based products / skin lotions / body lotions removed Wigs or hairpieces removed NA Smoking or tobacco  materials removed NA Books / newspapers / magazines / loose paper removed Cologne, aftershave, perfume and deodorant removed Jewelry removed (may wrap wedding band) Make-up removed Hair care products removed Battery operated devices (external) removed Heating patches and chemical warmers removed Titanium eyewear removed Nail polish cured greater than  10 hours NA Casting material cured greater than 10 hours NA Hearing aids removed NA Loose dentures or partials removed NA Prosthetics have been removed NA Patient demonstrates correct use of air break device (if applicable) Patient concerns have been addressed Patient grounding bracelet on and cord attached to chamber Specifics for Inpatients (complete in addition to above) Medication sheet sent with patient NA Intravenous medications needed or due during therapy sent with patient NA Drainage tubes (e.g. nasogastric tube or chest tube secured and vented) NA Endotracheal or Tracheotomy tube secured NA Cuff deflated of air and inflated with saline NA Airway suctioned NA Notes Paper version used prior to treatment start. Electronic Signature(s) Signed: 09/15/2022 12:14:12 PM By: Donavan Burnet CHT EMT BS , , Entered By: Donavan Burnet on 09/15/2022 12:14:12

## 2022-09-16 ENCOUNTER — Ambulatory Visit (HOSPITAL_BASED_OUTPATIENT_CLINIC_OR_DEPARTMENT_OTHER): Payer: 59 | Admitting: Internal Medicine

## 2022-09-16 ENCOUNTER — Encounter (HOSPITAL_BASED_OUTPATIENT_CLINIC_OR_DEPARTMENT_OTHER): Payer: 59 | Admitting: Internal Medicine

## 2022-09-16 DIAGNOSIS — S21002D Unspecified open wound of left breast, subsequent encounter: Secondary | ICD-10-CM

## 2022-09-16 DIAGNOSIS — L59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: Secondary | ICD-10-CM | POA: Diagnosis not present

## 2022-09-16 DIAGNOSIS — T8131XD Disruption of external operation (surgical) wound, not elsewhere classified, subsequent encounter: Secondary | ICD-10-CM | POA: Diagnosis not present

## 2022-09-16 DIAGNOSIS — D0512 Intraductal carcinoma in situ of left breast: Secondary | ICD-10-CM

## 2022-09-16 DIAGNOSIS — Z8614 Personal history of Methicillin resistant Staphylococcus aureus infection: Secondary | ICD-10-CM | POA: Diagnosis not present

## 2022-09-16 NOTE — Progress Notes (Addendum)
SEASON, GOLDTOOTH ([id number]) [id number] Page 1 of 2 Visit Report for 09/16/2022 HBO Details Patient Name: Date of Service: [name] [name] Mile. 09/16/2022 8:00 A M Medical Record Number: [id number] Patient Account Number: [id number] Date of Birth/Sex: Treating RN: August 17, 1970 (52 y.o. 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 HBO Treatment Details Treatment Number: 30 Patient Type: Outpatient Chamber Type: Monoplace Chamber Serial #: [id number] Treatment Protocol: 2.0 ATA with 90 minutes oxygen, and no air breaks Treatment Details Compression Rate Down: 2.0 psi / minute De-Compression Rate Up: Air breaks and breathing Decompress Decompress Compress Tx Pressure Begins Reached periods Begins Ends (leave unused spaces blank) Chamber Pressure (ATA 1 2 ------2 1 ) Clock Time (24 hr) 08:15 08:25 - - - - - - 09:55 10:03 Treatment Length: 108 (minutes) Treatment Segments: 4 Vital Signs Capillary Blood Glucose Reference Range: 80 - 120 mg / dl HBO Diabetic Blood Glucose Intervention Range: <131 mg/dl or >249 mg/dl Time Vitals Blood Respiratory Capillary Blood Glucose Pulse Action Type: Pulse: Temperature: Taken: Pressure: Rate: Glucose (mg/dl): Meter #: Oximetry (%) Taken: Pre 08:11 122/91 67 20 97.2 Post 10:06 110/85 75 18 97.1 Treatment Response Treatment Toleration: Well Treatment Completion Status: Treatment Completed without Adverse Event Physician HBO Attestation: I certify that I supervised this HBO treatment in accordance with Medicare guidelines. A trained emergency response  team is readily available per Yes hospital policies and procedures. Continue HBOT as ordered. Yes Electronic Signature(s) Signed: 09/17/2022 3:34:03 PM By: Kalman Shan DO Previous Signature: 09/16/2022 11:03:12 AM Version By: [name] [name] EMT Previous Signature: 09/16/2022 9:36:37 AM Version By: [name] [name] EMT Entered By: Kalman Shan on 09/17/2022 12:49:38 -------------------------------------------------------------------------------- HBO Safety Checklist Details Patient Name: Date of Service: [name] [name], [name] [name] 09/16/2022 8:00 A M Medical Record Number: [id number] Patient Account Number: [id number] Date of Birth/Sex: Treating RN: 09-22-1970 (52 y.o. [name] [name] Primary Care Takeyah Wieman: [name] [name] Other Clinician: [name], [name] [name] ([id number]) [id number] Page 2 of 2 Referring [name] [name]: Treating [name]: [name] [name], [name] [name] in Treatment: 20 HBO Safety Checklist Items Safety Checklist Consent Form Signed Patient voided / foley secured and emptied When did you last eato last night Last dose of injectable or oral agent NA Ostomy pouch emptied and vented if applicable NA All implantable devices assessed, documented and approved NA Intravenous access site secured and place NA Valuables secured Linens and cotton and cotton/polyester blend (less than 51% polyester) Personal oil-based products / skin lotions / body lotions removed Wigs or hairpieces removed Smoking or tobacco materials removed Books / newspapers / magazines / loose paper removed Cologne, aftershave, perfume and deodorant removed Jewelry removed (may wrap wedding band) Make-up removed Hair care products removed Battery operated devices (external) removed Heating patches and chemical warmers removed Titanium eyewear removed NA Nail polish cured greater than 10 hours NA Casting material cured greater than 10  hours NA Hearing aids removed NA Loose dentures or partials removed NA Prosthetics have been removed NA Patient demonstrates correct use of air break device (if applicable) Patient concerns have been addressed Patient grounding bracelet on and cord attached to chamber Specifics for Inpatients (complete in addition to above) Medication sheet sent with patient NA Intravenous medications needed or due  during therapy sent with patient NA Drainage tubes (e.g. nasogastric tube or chest tube secured and vented) NA Endotracheal or Tracheotomy tube secured NA Cuff deflated of air and inflated with saline NA Airway suctioned NA Notes The safety checklist was done before the treatment was started. Electronic Signature(s) Signed: 09/16/2022 9:36:07 AM By: [name] [name] EMT Entered By: [name] [name] on 09/16/2022 09:36:07

## 2022-09-16 NOTE — Progress Notes (Signed)
[name], [name] ([id number]) [id number] Page 1 of 2 Visit Report for 09/16/2022 Arrival Information Details Patient Name: Date of Service: [name], [name] 09/16/2022 8:00 A M Medical Record Number: [id number] Patient Account Number: [id number] Date of Birth/Sex: Treating RN: [birth year] (52 y.o. [name] Primary Care [name]: [name] Other Clinician: Valeria [name] Referring [name]: Treating [name]/Extender: [name] in Treatment: 20 Visit Information History Since Last Visit All ordered tests and consults were completed: Yes Patient Arrived: Ambulatory Added or deleted any medications: No Arrival Time: 07:15 Any new allergies or adverse reactions: No Accompanied By: None Had a fall or experienced change in No Transfer Assistance: None activities of daily living that may affect Patient Identification Verified: Yes risk of falls: Secondary Verification Process Completed: Yes Signs or symptoms of abuse/neglect since last visito No Patient Requires Transmission-Based Precautions: No Hospitalized since last visit: No Patient Has Alerts: No Implantable device outside of the clinic excluding No cellular tissue based products placed in the center since last visit: Pain Present Now: No Electronic Signature(s) Signed: 09/16/2022 9:34:23 AM By: [name] EMT Entered By: [name] on 09/16/2022 09:34:23 -------------------------------------------------------------------------------- Encounter Discharge Information Details Patient Name: Date of Service: [name], [name] 09/16/2022 8:00 A M Medical Record Number: [id number] Patient Account Number: [id number] Date of Birth/Sex: Treating RN: Oct 08, 1970 (52 y.o. [name] Primary Care [name]: [name] Other Clinician: Valeria [name] Referring [name]: Treating [name]/Extender: [name] in  Treatment: 20 Encounter Discharge Information Items Discharge Condition: Stable Ambulatory Status: Ambulatory Discharge Destination: Home Transportation: Private Auto Accompanied By: None Schedule Follow-up Appointment: Yes Clinical Summary of Care: Electronic Signature(s) Signed: 09/16/2022 11:04:26 AM By: [name] EMT Entered By: [name] on 09/16/2022 11:04:25 -------------------------------------------------------------------------------- Vitals Details Patient Name: Date of Service: [name], [name] 09/16/2022 8:00 A M Medical Record Number: [id number] Patient Account Number: [id number] Date of Birth/Sex: Treating RN: 05/17/1971 (52 y.o. [name] Primary Care [name]: [name] Other Clinician: Valeria [name] Referring [name]: Treating [name]/Extender: [name], [name] in Treatment: 20 Vital Signs Time Taken: 08:11 Temperature (F): 97.2 [name], [name] ([id number]) [id number] Page 2 of 2 Height (in): 63 Pulse (bpm): 67 Weight (lbs): 170 Respiratory Rate (breaths/min): 20 Body Mass Index (BMI): 30.1 Blood Pressure (mmHg): 122/91 Reference Range: 80 - 120 mg / dl Electronic Signature(s) Signed: 09/16/2022 9:34:48 AM By: [name] EMT Entered By: [name] on 09/16/2022 09:34:48

## 2022-09-17 ENCOUNTER — Encounter (HOSPITAL_BASED_OUTPATIENT_CLINIC_OR_DEPARTMENT_OTHER): Payer: 59 | Admitting: Internal Medicine

## 2022-09-17 DIAGNOSIS — L59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: Secondary | ICD-10-CM

## 2022-09-17 DIAGNOSIS — T8131XD Disruption of external operation (surgical) wound, not elsewhere classified, subsequent encounter: Secondary | ICD-10-CM | POA: Diagnosis not present

## 2022-09-17 DIAGNOSIS — S21002D Unspecified open wound of left breast, subsequent encounter: Secondary | ICD-10-CM

## 2022-09-17 DIAGNOSIS — D0512 Intraductal carcinoma in situ of left breast: Secondary | ICD-10-CM | POA: Diagnosis not present

## 2022-09-17 DIAGNOSIS — Z8614 Personal history of Methicillin resistant Staphylococcus aureus infection: Secondary | ICD-10-CM | POA: Diagnosis not present

## 2022-09-18 ENCOUNTER — Encounter (HOSPITAL_BASED_OUTPATIENT_CLINIC_OR_DEPARTMENT_OTHER): Payer: 59 | Admitting: Internal Medicine

## 2022-09-18 DIAGNOSIS — S21002D Unspecified open wound of left breast, subsequent encounter: Secondary | ICD-10-CM | POA: Diagnosis not present

## 2022-09-18 DIAGNOSIS — T8131XD Disruption of external operation (surgical) wound, not elsewhere classified, subsequent encounter: Secondary | ICD-10-CM

## 2022-09-18 DIAGNOSIS — Z8614 Personal history of Methicillin resistant Staphylococcus aureus infection: Secondary | ICD-10-CM | POA: Diagnosis not present

## 2022-09-18 DIAGNOSIS — D0512 Intraductal carcinoma in situ of left breast: Secondary | ICD-10-CM | POA: Diagnosis not present

## 2022-09-18 DIAGNOSIS — L59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: Secondary | ICD-10-CM

## 2022-09-18 NOTE — Progress Notes (Signed)
ROWYNN, KULLBERG ([id number]) [id number] Page 1 of 1 Visit Report for 09/18/2022 SuperBill Details Patient Name: Date of Service: [name] 09/18/2022 Medical Record Number: [id number] Patient Account Number: [id number] Date of Birth/Sex: Treating RN: 1971/04/27 (52 y.o. 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 Facility Procedures CPT4 Code Description Modifier Quantity [id number]-(Facility Use Only) HBOT full body chamber, 83min , 4 ICD-10 Diagnosis Description 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 S21.002D Unspecified open wound of left breast, subsequent encounter T81.31XD Disruption of external operation (surgical) wound, not elsewhere classified, subsequent encounter 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 Physician Procedures Quantity CPT4 Code Description Modifier [id number] - WC PHYS HYPERBARIC OXYGEN THERAPY 1 ICD-10 Diagnosis Description 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

## 2022-09-18 NOTE — Progress Notes (Signed)
AMOREENA, STENE ([id number]) [id number] Page 1 of 2 Visit Report for 09/16/2022 Problem List Details Patient Name: Date of Service: [name], [name] 09/16/2022 8:00 A M Medical Record Number: [id number] Patient Account Number: [id number] Date of Birth/Sex: Treating RN: [name], [name] (52 y.o. 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 09/16/2022 Medical Record Number: [id number] Patient Account Number: [id number] Date of Birth/Sex: Treating RN: [name] (52 y.o. 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 Facility Procedures : ADIAH, DRAGOTTA Code: [id number] Gracy Racer (004 ICD Description: G0277-(Facility Use Only) HBOT full body chamber, 54min , [id number]) [id number] -[name] Diagnosis Description L59.8 Other specified disorders of the skin and subcutaneous tissue related to radi S21.002D Unspecified open wound of left breast,  subsequent encounter 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 Modifier: [id number] ation Quantity: 4 [id number] Page 2 of 2 Physician Procedures : CPT4 Code Description Modifier [id number] - WC PHYS HYPERBARIC OXYGEN THERAPY ICD-[name] Diagnosis Description 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

## 2022-09-18 NOTE — Progress Notes (Signed)
ALIANYS, LABELLA ([id number]) [id number] Page 1 of 2 Visit Report for 09/18/2022 Arrival Information Details Patient Name: Date of Service: [name], [name] 09/18/2022 8:00 A M Medical Record Number: [id number] Patient Account Number: [id number] Date of Birth/Sex: Treating RN: April 02, 1971 (52 y.o. Female) [place], [place] Primary Care Iaan Oregel: [name] Other Clinician: Donavan Burnet Referring Jane Birkel: Treating Nour Rodrigues/Extender: [name] in Treatment: 21 Visit Information History Since Last Visit All ordered tests and consults were completed: Yes Patient Arrived: Ambulatory Added or deleted any medications: No Arrival Time: 08:03 Any new allergies or adverse reactions: No Accompanied By: self Had a fall or experienced change in No Transfer Assistance: None activities of daily living that may affect Patient Identification Verified: Yes risk of falls: Secondary Verification Process Completed: Yes Signs or symptoms of abuse/neglect since last visito No Patient Requires Transmission-Based Precautions: No Hospitalized since last visit: No Patient Has Alerts: No Implantable device outside of the clinic excluding No cellular tissue based products placed in the center since last visit: Pain Present Now: No Electronic Signature(s) Signed: 09/18/2022 11:41:03 AM By: Donavan Burnet CHT EMT BS , , Entered By: Donavan Burnet on 09/18/2022 11:41:03 -------------------------------------------------------------------------------- Encounter Discharge Information Details Patient Name: Date of Service: [name], [name] Cory W. 09/18/2022 8:00 A M Medical Record Number: [id number] Patient Account Number: [id number] Date of Birth/Sex: Treating RN: [birth year] (52 y.o. Female) Blanche East Primary Care Geronimo Diliberto: [name] Other Clinician: Donavan Burnet Referring Kashay Cavenaugh: Treating Zyniah Ferraiolo/Extender: [name] in Treatment: 21 Encounter Discharge Information Items Discharge Condition: Stable Ambulatory Status: Ambulatory Discharge Destination: Home Transportation: Private Auto Accompanied By: self Schedule Follow-up Appointment: No Clinical Summary of Care: Electronic Signature(s) Signed: 09/18/2022 12:04:40 PM By: Donavan Burnet CHT EMT BS , , Entered By: Donavan Burnet on 09/18/2022 12:04:40 -------------------------------------------------------------------------------- Vitals Details Patient Name: Date of Service: [name], [name] Ridge W. 09/18/2022 8:00 A M Medical Record Number: [id number] Patient Account Number: [id number] Date of Birth/Sex: Treating RN: [birth year] (52 y.o. Female) Blanche East Primary Care Wallis Spizzirri: [name] Other Clinician: Donavan Burnet Referring Lyon Dumont: Treating Cricket Goodlin/Extender: [name], [name] Weeks in Treatment: 21 Vital Signs Time Taken: 08:10 Temperature (F): 97.8 [name], [name] ([id number]) [id number] Page 2 of 2 Height (in): 63 Pulse (bpm): 74 Weight (lbs): 170 Respiratory Rate (breaths/min): 20 Body Mass Index (BMI): 30.1 Blood Pressure (mmHg): 124/93 Reference Range: 80 - 120 mg / dl Electronic Signature(s) Signed: 09/18/2022 11:41:25 AM By: Donavan Burnet CHT EMT BS , , Entered By: Donavan Burnet on 09/18/2022 11:41:25

## 2022-09-18 NOTE — Progress Notes (Addendum)
CARLISS, BLATT ([id number]) [id number] Page 1 of 2 Visit Report for 09/17/2022 HBO Details Patient Name: Date of Service: [name] [name] Mile. 09/17/2022 8:00 A M Medical Record Number: [id number] Patient Account Number: [id number] Date of Birth/Sex: Treating RN: 10-05-1970 (52 y.o. 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 HBO Treatment Details Treatment Number: 31 Patient Type: Outpatient Chamber Type: Monoplace Chamber Serial #: [id number] Treatment Protocol: 2.0 ATA with 90 minutes oxygen, and no air breaks Treatment Details Compression Rate Down: 1.5 psi / minute De-Compression Rate Up: 2.0 psi / minute Air breaks and breathing Decompress Decompress Compress Tx Pressure Begins Reached periods Begins Ends (leave unused spaces blank) Chamber Pressure (ATA 1 2 ------2 1 ) Clock Time (24 hr) 08:18 08:27 - - - - - - 09:57 10:05 Treatment Length: 107 (minutes) Treatment Segments: 4 Vital Signs Capillary Blood Glucose Reference Range: 80 - 120 mg / dl HBO Diabetic Blood Glucose Intervention Range: <131 mg/dl or >249 mg/dl Type: Time Vitals Blood Respiratory Capillary Blood Glucose Pulse Action Pulse: Temperature: Taken: Pressure: Rate: Glucose (mg/dl): Meter #: Oximetry (%) Taken: Pre 08:05 136/91 73 20 97.4 none per protocol Post 10:08 115/91 74 20 97.8 Treatment Response Treatment Toleration: Well Treatment Completion Status: Treatment Completed without Adverse Event Treatment Notes [name] [name] arrived and prepared for the treatment. Vital signs were within normal range.  After performing safety check, patient was placed in the chamber which was compressed with 100% oxygen at a rate of 2 psi/min until ear equalization was verified. Patient tolerated treatment and subsequent decompression at a rate of 2 psi/min. Post-treatment vital signs were normal and patient was stable upon discharge. Physician HBO Attestation: I certify that I supervised this HBO treatment in accordance with Medicare guidelines. A trained emergency response team is readily available per Yes hospital policies and procedures. Continue HBOT as ordered. Yes Electronic Signature(s) Signed: 09/18/2022 1:36:39 PM By: Kalman Shan DO Previous Signature: 09/17/2022 3:21:03 PM Version By: Donavan Burnet CHT EMT BS , , Entered By: Kalman Shan on 09/18/2022 13:30:51 HBO Safety Checklist Details -------------------------------------------------------------------------------- [name] [name] ([id number].pdf Page 2 of 2 Patient Name: Date of Service: [name] [name] Mile. 09/17/2022 8:00 A M Medical Record Number: [id number] Patient Account Number: [id number] Date of Birth/Sex: Treating RN: [birth year] (52 y.o. Female) Baruch Gouty Primary Care Cortana Vanderford: [name] Other Clinician: Donavan Burnet Referring Nathanal Hermiz: Treating Orion Vandervort/Extender: [name], [name] Weeks in Treatment: 20 HBO Safety Checklist Items Safety Checklist Consent Form Signed Patient voided / foley secured and emptied When did you last eato Yesterday evening Last dose of injectable or oral agent n/a Ostomy pouch emptied and vented if applicable NA All implantable devices assessed, documented and approved NA Intravenous access site secured and place NA Valuables secured Linens and cotton and cotton/polyester blend (less than 51% polyester) Personal oil-based products / skin lotions / body lotions removed Wigs or hairpieces removed NA Smoking or tobacco  materials removed NA Books / newspapers / magazines / loose paper removed Cologne, aftershave, perfume and deodorant removed Jewelry removed (may wrap wedding band) Make-up removed Hair care products removed Battery operated devices (external) removed Heating patches and chemical warmers removed Titanium eyewear removed Nail polish cured greater than 10  hours NA Casting material cured greater than 10 hours NA Hearing aids removed NA Loose dentures or partials removed NA Prosthetics have been removed NA Patient demonstrates correct use of air break device (if applicable) Patient concerns have been addressed Patient grounding bracelet on and cord attached to chamber Specifics for Inpatients (complete in addition to above) Medication sheet sent with patient NA Intravenous medications needed or due during therapy sent with patient NA Drainage tubes (e.g. nasogastric tube or chest tube secured and vented) NA Endotracheal or Tracheotomy tube secured NA Cuff deflated of air and inflated with saline NA Airway suctioned NA Notes Paper version used prior to treatment start. Electronic Signature(s) Signed: 09/17/2022 3:17:58 PM By: Donavan Burnet CHT EMT BS , , Entered By: Donavan Burnet on 09/17/2022 15:17:57

## 2022-09-18 NOTE — Progress Notes (Signed)
[name], [name] ([id number]) [id number] Page 1 of 2 Visit Report for 09/17/2022 Arrival Information Details Patient Name: Date of Service: [name], [name] 09/17/2022 8:00 A M Medical Record Number: [id number] Patient Account Number: [id number] Date of Birth/Sex: Treating RN: [birth year] (52 y.o. [name] Primary Care Toby Breithaupt: [name] Other Clinician: Donavan Burnet Referring Peta Peachey: Treating Gennett Garcia/Extender: [name] in Treatment: 36 Visit Information History Since Last Visit All ordered tests and consults were completed: Yes Patient Arrived: Ambulatory Added or deleted any medications: No Arrival Time: 07:59 Any new allergies or adverse reactions: No Accompanied By: self Had a fall or experienced change in No Transfer Assistance: None activities of daily living that may affect Patient Identification Verified: Yes risk of falls: Secondary Verification Process Completed: Yes Signs or symptoms of abuse/neglect since last visito No Patient Requires Transmission-Based Precautions: No Hospitalized since last visit: No Patient Has Alerts: No Implantable device outside of the clinic excluding No cellular tissue based products placed in the center since last visit: Pain Present Now: No Electronic Signature(s) Signed: 09/17/2022 3:16:43 PM By: Donavan Burnet CHT EMT BS , , Entered By: Donavan Burnet on 09/17/2022 15:16:43 -------------------------------------------------------------------------------- Encounter Discharge Information Details Patient Name: Date of Service: [name], [name] 09/17/2022 8:00 A M Medical Record Number: [id number] Patient Account Number: [id number] Date of Birth/Sex: Treating RN: 1970/08/31 (52 y.o. [name] Primary Care Summer Mccolgan: [name] Other Clinician: Donavan Burnet Referring Gabe Glace: Treating Loana Salvaggio/Extender: [name] in Treatment: 20 Encounter Discharge Information Items Discharge Condition: Stable Ambulatory Status: Ambulatory Discharge Destination: Home Transportation: Private Auto Accompanied By: self Schedule Follow-up Appointment: No Clinical Summary of Care: Electronic Signature(s) Signed: 09/17/2022 3:21:45 PM By: Donavan Burnet CHT EMT BS , , Entered By: Donavan Burnet on 09/17/2022 15:21:44 -------------------------------------------------------------------------------- Vitals Details Patient Name: Date of Service: [name], [name] 09/17/2022 8:00 A M Medical Record Number: [id number] Patient Account Number: [id number] Date of Birth/Sex: Treating RN: [birth year] (52 y.o. [name] Primary Care Axie Hayne: [name] Other Clinician: Donavan Burnet Referring Other Atienza: Treating Libbi Towner/Extender: [name], [name] Weeks in Treatment: 20 Vital Signs Time Taken: 08:05 Temperature (F): 97.4 [name], [name] ([id number]) [id number] Page 2 of 2 Height (in): 63 Pulse (bpm): 73 Weight (lbs): 170 Respiratory Rate (breaths/min): 20 Body Mass Index (BMI): 30.1 Blood Pressure (mmHg): 136/91 Reference Range: 80 - 120 mg / dl Electronic Signature(s) Signed: 09/17/2022 3:17:03 PM By: Donavan Burnet CHT EMT BS , , Entered By: Donavan Burnet on 09/17/2022 15:17:02

## 2022-09-18 NOTE — Progress Notes (Addendum)
GITEL, NISSLEY ([id number]) [id number] Page 1 of 2 Visit Report for 09/18/2022 HBO Details Patient Name: Date of Service: [name], [name] 09/18/2022 8:00 A M Medical Record Number: [id number] Patient Account Number: [id number] Date of Birth/Sex: Treating RN: 01-13-1971 (52 y.o. 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 HBO Treatment Details Treatment Number: 32 Patient Type: Outpatient Chamber Type: Monoplace Chamber Serial #: [id number] Treatment Protocol: 2.0 ATA with 90 minutes oxygen, and no air breaks Treatment Details Compression Rate Down: 1.5 psi / minute De-Compression Rate Up: 2.0 psi / minute Air breaks and breathing Decompress Decompress Compress Tx Pressure Begins Reached periods Begins Ends (leave unused spaces blank) Chamber Pressure (ATA 1 2 ------2 1 ) Clock Time (24 hr) 08:22 08:34 - - - - - - 10:04 10:12 Treatment Length: 110 (minutes) Treatment Segments: 4 Vital Signs Capillary Blood Glucose Reference Range: 80 - 120 mg / dl HBO Diabetic Blood Glucose Intervention Range: <131 mg/dl or >249 mg/dl Type: Time Vitals Blood Respiratory Capillary Blood Glucose Pulse Action Pulse: Temperature: Taken: Pressure: Rate: Glucose (mg/dl): Meter #: Oximetry (%) Taken: Pre 08:10 124/93 74 20 97.8 none per protocol Post 10:14 120/83 78 18 98.2 none per protocol Treatment Response Treatment Toleration: Well Treatment Completion Status: Treatment Completed without Adverse Event Physician HBO Attestation: I certify that I supervised this HBO treatment in accordance  with Medicare guidelines. A trained emergency response team is readily available per Yes hospital policies and procedures. Continue HBOT as ordered. Yes Electronic Signature(s) Signed: 09/18/2022 1:36:39 PM By: Kalman Shan DO Previous Signature: 09/18/2022 12:01:44 PM Version By: Donavan Burnet CHT EMT BS , , Entered By: Kalman Shan on 09/18/2022 13:35:23 -------------------------------------------------------------------------------- HBO Safety Checklist Details Patient Name: Date of Service: [name] [name], [name] 09/18/2022 8:00 A M Medical Record Number: [id number] Patient Account Number: [id number] Date of Birth/Sex: Treating RN: [birth year] (52 y.o. Female) Blanche East Primary Care Genesis Novosad: [name] Other Clinician: Donavan Burnet Referring Eve Rey: Treating [name]/Extender: Tora Kindred Palisade, [name] ([id number]) [id number] Page 2 of 2 Weeks in Treatment: 21 HBO Safety Checklist Items Safety Checklist Consent Form Signed Patient voided / foley secured and emptied When did you last eato Yesterday evening Last dose of injectable or oral agent n/a Ostomy pouch emptied and vented if applicable NA All implantable devices assessed, documented and approved NA Intravenous access site secured and place NA Valuables secured Linens and cotton and cotton/polyester blend (less than 51% polyester) Personal oil-based products / skin lotions / body lotions removed Wigs or hairpieces removed NA Smoking or tobacco materials removed NA Books / newspapers / magazines / loose paper removed Cologne, aftershave, perfume and deodorant removed Jewelry removed (may wrap wedding band) Make-up removed Hair care products removed Battery operated devices (external) removed Heating patches and chemical warmers removed Titanium eyewear removed Nail polish cured greater than 10 hours greater than 10 hours Casting material  cured greater than 10 hours NA Hearing aids removed NA Loose dentures or partials removed NA Prosthetics have been removed NA Patient demonstrates correct use of air break device (if applicable) Patient concerns have been addressed Patient grounding bracelet on and cord attached to chamber Specifics for Inpatients (complete in addition to above) Medication sheet sent  with patient NA Intravenous medications needed or due during therapy sent with patient NA Drainage tubes (e.g. nasogastric tube or chest tube secured and vented) NA Endotracheal or Tracheotomy tube secured NA Cuff deflated of air and inflated with saline NA Airway suctioned NA Notes Paper version used prior to treatment start. Electronic Signature(s) Signed: 09/18/2022 11:42:34 AM By: Donavan Burnet CHT EMT BS , , Entered By: Donavan Burnet on 09/18/2022 11:42:34

## 2022-09-18 NOTE — Progress Notes (Signed)
[name], [name] ([id number]) [id number] Page 1 of 1 Visit Report for 09/17/2022 SuperBill Details Patient Name: Date of Service: [name], [name] 09/17/2022 Medical Record Number: [id number] Patient Account Number: [id number] Date of Birth/Sex: Treating RN: 1971/06/13 (52 y.o. 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 Facility Procedures CPT4 Code Description Modifier Quantity [id number]-(Facility Use Only) HBOT full body chamber, 75min , 4 ICD-10 Diagnosis Description 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 S21.002D Unspecified open wound of left breast, subsequent encounter T81.31XD Disruption of external operation (surgical) wound, not elsewhere classified, subsequent encounter 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 Physician Procedures Quantity CPT4 Code Description Modifier [id number] - WC PHYS HYPERBARIC OXYGEN THERAPY 1 ICD-10 Diagnosis Description 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

## 2022-09-21 ENCOUNTER — Encounter (HOSPITAL_BASED_OUTPATIENT_CLINIC_OR_DEPARTMENT_OTHER): Payer: 59 | Admitting: Internal Medicine

## 2022-09-21 DIAGNOSIS — D0512 Intraductal carcinoma in situ of left breast: Secondary | ICD-10-CM | POA: Diagnosis not present

## 2022-09-21 DIAGNOSIS — L59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: Secondary | ICD-10-CM

## 2022-09-21 DIAGNOSIS — S21002D Unspecified open wound of left breast, subsequent encounter: Secondary | ICD-10-CM

## 2022-09-21 DIAGNOSIS — T8131XD Disruption of external operation (surgical) wound, not elsewhere classified, subsequent encounter: Secondary | ICD-10-CM

## 2022-09-21 DIAGNOSIS — Z8614 Personal history of Methicillin resistant Staphylococcus aureus infection: Secondary | ICD-10-CM | POA: Diagnosis not present

## 2022-09-21 NOTE — Progress Notes (Signed)
[name], [name] ([id number]) [id number] Page 1 of 2 Visit Report for 09/21/2022 HBO Details Patient Name: Date of Service: [name], [name] 09/21/2022 8:00 A M Medical Record Number: [id number] Patient Account Number: [id number] Date of Birth/Sex: Treating RN: August 14, 1970 (52 y.o. 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 HBO Treatment Details Treatment Number: 33 Patient Type: Outpatient Chamber Type: Monoplace Chamber Serial #: [id number] Treatment Protocol: 2.0 ATA with 90 minutes oxygen, and no air breaks Treatment Details Compression Rate Down: 1.5 psi / minute De-Compression Rate Up: 2.0 psi / minute Air breaks and breathing Decompress Decompress Compress Tx Pressure Begins Reached periods Begins Ends (leave unused spaces blank) Chamber Pressure (ATA 1 2 ------2 1 ) Clock Time (24 hr) 08:20 08:31 - - - - - - 10:01 10:09 Treatment Length: 109 (minutes) Treatment Segments: 4 Vital Signs Capillary Blood Glucose Reference Range: 80 - 120 mg / dl HBO Diabetic Blood Glucose Intervention Range: <131 mg/dl or >249 mg/dl Type: Time Vitals Blood Respiratory Capillary Blood Glucose Pulse Action Pulse: Temperature: Taken: Pressure: Rate: Glucose (mg/dl): Meter #: Oximetry (%) Taken: Pre 07:54 121/61 73 18 97.2 none per protocol Post 10:14 123/70 80 18 97.2 none per protocol Treatment Response Treatment Toleration: Well Treatment Completion Status: Treatment Completed without Adverse Event Physician HBO Attestation: I certify that I supervised this HBO treatment in accordance with  Medicare guidelines. A trained emergency response team is readily available per Yes hospital policies and procedures. Continue HBOT as ordered. Yes Electronic Signature(s) Signed: 09/21/2022 4:26:30 PM By: [name] Shan DO Previous Signature: 09/21/2022 1:40:32 PM Version By: [name] CHT EMT BS , , Entered By: [name] on 09/21/2022 16:24:30 -------------------------------------------------------------------------------- HBO Safety Checklist Details Patient Name: Date of Service: [name], [name] 09/21/2022 8:00 A M Medical Record Number: [id number] Patient Account Number: [id number] Date of Birth/Sex: Treating RN: 1970-11-28 (52 y.o. [name] Primary Care [name]: [name] Other Clinician: Donavan [name] Referring [name]: Treating [name]/Extender: [name], [name] ([id number]) [id number] Page 2 of 2 Weeks in Treatment: 21 HBO Safety Checklist Items Safety Checklist Consent Form Signed Patient voided / foley secured and emptied When did you last eato 0730 Last dose of injectable or oral agent n/a Ostomy pouch emptied and vented if applicable NA All implantable devices assessed, documented and approved NA Intravenous access site secured and place NA Valuables secured Linens and cotton and cotton/polyester blend (less than 51% polyester) Personal oil-based products / skin lotions / body lotions removed Wigs or hairpieces removed NA Smoking or tobacco materials removed NA Books / newspapers / magazines / loose paper removed Cologne, aftershave, perfume and deodorant removed Jewelry removed (may wrap wedding band) Make-up removed Hair care products removed Battery operated devices (external) removed NA Heating patches and chemical warmers removed NA Titanium eyewear removed NA Nail polish cured greater than 10 hours greater than 10 hours Casting material cured greater  than 10 hours NA Hearing aids removed NA Loose dentures or partials removed NA Prosthetics have been removed NA Patient demonstrates correct use of air break device (if applicable) Patient concerns have been addressed Patient grounding bracelet on and cord attached to chamber Specifics for Inpatients (complete in addition to above) Medication  sheet sent with patient NA Intravenous medications needed or due during therapy sent with patient NA Drainage tubes (e.g. nasogastric tube or chest tube secured and vented) NA Endotracheal or Tracheotomy tube secured NA Cuff deflated of air and inflated with saline NA Airway suctioned NA Notes Paper version used prior to treatment start. Electronic Signature(s) Signed: 09/21/2022 10:07:27 AM By: [name] CHT EMT BS , , Entered By: [name] on 09/21/2022 10:06:49

## 2022-09-21 NOTE — Progress Notes (Addendum)
[name], [name] ([id number]) [id number] Page 1 of 2 Visit Report for 09/21/2022 Arrival Information Details Patient Name: Date of Service: [name], [name] 09/21/2022 8:00 A M Medical Record Number: [id number] Patient Account Number: [id number] Date of Birth/Sex: Treating RN: 1970/07/25 (52 y.o. [name], Meta.Reding Primary Care Jarvis Knodel: [name] Other Clinician: Donavan Burnet Referring Oryon Gary: Treating Cordarius Benning/Extender: [name] in Treatment: 21 Visit Information History Since Last Visit All ordered tests and consults were completed: Yes Patient Arrived: Ambulatory Added or deleted any medications: No Arrival Time: 07:45 Any new allergies or adverse reactions: No Accompanied By: self Had a fall or experienced change in No Transfer Assistance: None activities of daily living that may affect Patient Identification Verified: Yes risk of falls: Secondary Verification Process Completed: Yes Signs or symptoms of abuse/neglect since last visito No Patient Requires Transmission-Based Precautions: No Hospitalized since last visit: No Patient Has Alerts: No Implantable device outside of the clinic excluding No cellular tissue based products placed in the center since last visit: Pain Present Now: No Electronic Signature(s) Signed: 09/21/2022 10:07:27 AM By: Donavan Burnet CHT EMT BS , , Entered By: Donavan Burnet on 09/21/2022 10:02:58 -------------------------------------------------------------------------------- Encounter Discharge Information Details Patient Name: Date of Service: [name], [place] W. 09/21/2022 8:00 A M Medical Record Number: [id number] Patient Account Number: [id number] Date of Birth/Sex: Treating RN: April 16, 1971 (52 y.o. [name] Primary Care Mardell Cragg: [name] Other Clinician: Donavan Burnet Referring Bentlee Benningfield: Treating Tashina Credit/Extender: [name] in Treatment: 76 Encounter Discharge Information Items Discharge Condition: Stable Ambulatory Status: Ambulatory Discharge Destination: Home Transportation: Private Auto Accompanied By: self Schedule Follow-up Appointment: No Clinical Summary of Care: Electronic Signature(s) Signed: 09/21/2022 1:40:32 PM By: Donavan Burnet CHT EMT BS , , Entered By: Donavan Burnet on 09/21/2022 12:30:48 -------------------------------------------------------------------------------- Vitals Details Patient Name: Date of Service: [name], [place] W. 09/21/2022 8:00 A M Medical Record Number: [id number] Patient Account Number: [id number] Date of Birth/Sex: Treating RN: 12/22/1970 (52 y.o. [name] Primary Care Marnee Sherrard: [name] Other Clinician: Donavan Burnet Referring Brandilyn Nanninga: Treating Takeo Harts/Extender: [name], [name] Weeks in Treatment: 21 Vital Signs Time Taken: 07:54 Temperature (F): 97.2 FLORETTA, MARUCCI ([id number]) [id number] Page 2 of 2 Height (in): 63 Pulse (bpm): 73 Weight (lbs): 170 Respiratory Rate (breaths/min): 18 Body Mass Index (BMI): 30.1 Blood Pressure (mmHg): 121/61 Reference Range: 80 - 120 mg / dl Electronic Signature(s) Signed: 09/21/2022 10:07:27 AM By: Donavan Burnet CHT EMT BS , , Entered By: Donavan Burnet on 09/21/2022 10:04:27

## 2022-09-22 ENCOUNTER — Other Ambulatory Visit (HOSPITAL_COMMUNITY): Payer: Self-pay

## 2022-09-22 ENCOUNTER — Other Ambulatory Visit: Payer: Self-pay

## 2022-09-22 ENCOUNTER — Encounter (HOSPITAL_BASED_OUTPATIENT_CLINIC_OR_DEPARTMENT_OTHER): Payer: 59 | Admitting: Internal Medicine

## 2022-09-22 MED ORDER — SERTRALINE HCL 100 MG PO TABS
100.0000 mg | ORAL_TABLET | Freq: Every day | ORAL | 2 refills | Status: DC
  Filled 2022-09-22 – 2022-11-08 (×2): qty 90, 90d supply, fill #0
  Filled 2023-02-21 – 2023-04-23 (×2): qty 90, 90d supply, fill #1
  Filled 2023-08-02: qty 90, 90d supply, fill #2

## 2022-09-22 NOTE — Progress Notes (Signed)
ASHTEN, SCHARRER ([id number]) [id number] Page 1 of 1 Visit Report for 09/21/2022 SuperBill Details Patient Name: Date of Service: [name] 09/21/2022 Medical Record Number: [id number] Patient Account Number: [id number] Date of Birth/Sex: Treating RN: Nov 07, 1970 (52 y.o. 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 Facility Procedures CPT4 Code Description Modifier Quantity [id number] G0277-(Facility Use Only) HBOT full body chamber, 87min , 4 ICD-10 Diagnosis Description 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 S21.002D Unspecified open wound of left breast, subsequent encounter T81.31XD Disruption of external operation (surgical) wound, not elsewhere classified, subsequent encounter 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 Physician Procedures Quantity CPT4 Code Description Modifier [id number] - WC PHYS HYPERBARIC OXYGEN THERAPY 1 ICD-10 Diagnosis Description 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

## 2022-09-23 ENCOUNTER — Other Ambulatory Visit: Payer: Self-pay

## 2022-09-23 ENCOUNTER — Encounter (HOSPITAL_BASED_OUTPATIENT_CLINIC_OR_DEPARTMENT_OTHER): Payer: 59 | Admitting: General Surgery

## 2022-09-23 DIAGNOSIS — S21002D Unspecified open wound of left breast, subsequent encounter: Secondary | ICD-10-CM | POA: Diagnosis not present

## 2022-09-23 DIAGNOSIS — T8131XD Disruption of external operation (surgical) wound, not elsewhere classified, subsequent encounter: Secondary | ICD-10-CM | POA: Diagnosis not present

## 2022-09-23 DIAGNOSIS — L59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: Secondary | ICD-10-CM | POA: Diagnosis not present

## 2022-09-23 DIAGNOSIS — Z8614 Personal history of Methicillin resistant Staphylococcus aureus infection: Secondary | ICD-10-CM | POA: Diagnosis not present

## 2022-09-23 DIAGNOSIS — D0512 Intraductal carcinoma in situ of left breast: Secondary | ICD-10-CM | POA: Diagnosis not present

## 2022-09-24 ENCOUNTER — Encounter (HOSPITAL_BASED_OUTPATIENT_CLINIC_OR_DEPARTMENT_OTHER): Payer: 59 | Admitting: General Surgery

## 2022-09-24 DIAGNOSIS — S21002D Unspecified open wound of left breast, subsequent encounter: Secondary | ICD-10-CM | POA: Diagnosis not present

## 2022-09-24 DIAGNOSIS — T8131XD Disruption of external operation (surgical) wound, not elsewhere classified, subsequent encounter: Secondary | ICD-10-CM | POA: Diagnosis not present

## 2022-09-24 DIAGNOSIS — L59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: Secondary | ICD-10-CM | POA: Diagnosis not present

## 2022-09-24 DIAGNOSIS — S21002A Unspecified open wound of left breast, initial encounter: Secondary | ICD-10-CM | POA: Diagnosis not present

## 2022-09-24 DIAGNOSIS — Z8614 Personal history of Methicillin resistant Staphylococcus aureus infection: Secondary | ICD-10-CM | POA: Diagnosis not present

## 2022-09-24 DIAGNOSIS — D0512 Intraductal carcinoma in situ of left breast: Secondary | ICD-10-CM | POA: Diagnosis not present

## 2022-09-24 NOTE — Progress Notes (Signed)
[name], [name] ([id number]) [id number] Page 1 of 2 Visit Report for 09/23/2022 Arrival Information Details Patient Name: Date of Service: [name] 09/23/2022 2:00 PM Medical Record Number: [id number] Patient Account Number: [id number] Date of Birth/Sex: Treating RN: 07-Jan-1971 (52 y.o. [name], [name] Primary Care [name]: [name] Other Clinician: Valeria [name] Referring [name]: Treating [name]/Extender: [name] in Treatment: 21 Visit Information History Since Last Visit All ordered tests and consults were completed: Yes Patient Arrived: Ambulatory Added or deleted any medications: No Arrival Time: 13:59 Any new allergies or adverse reactions: No Accompanied By: None Had a fall or experienced change in No Transfer Assistance: None activities of daily living that may affect Patient Identification Verified: Yes risk of falls: Secondary Verification Process Completed: Yes Signs or symptoms of abuse/neglect since last visito No Patient Requires Transmission-Based Precautions: No Hospitalized since last visit: No Patient Has Alerts: No Implantable device outside of the clinic excluding No cellular tissue based products placed in the center since last visit: Pain Present Now: No Electronic Signature(s) Signed: 09/23/2022 5:11:23 PM By: [name] EMT Entered By: [name] on 09/23/2022 17:11:23 -------------------------------------------------------------------------------- Encounter Discharge Information Details Patient Name: Date of Service: [name], [name] Bushy NIE W. 09/23/2022 2:00 PM Medical Record Number: [id number] Patient Account Number: [id number] Date of Birth/Sex: Treating RN: 07-12-1970 (52 y.o. [name] Primary Care [name]: [name] Other Clinician: Valeria [name] Referring [name]: Treating [name]/Extender: [name] in  Treatment: 45 Encounter Discharge Information Items Discharge Condition: Stable Ambulatory Status: Ambulatory Discharge Destination: Home Transportation: Private Auto Accompanied By: None Schedule Follow-up Appointment: Yes Clinical Summary of Care: Electronic Signature(s) Signed: 09/23/2022 5:16:20 PM By: [name] EMT Entered By: [name] on 09/23/2022 17:16:20 -------------------------------------------------------------------------------- Vitals Details Patient Name: Date of Service: [name], [name] Bushy NIE W. 09/23/2022 2:00 PM Medical Record Number: [id number] Patient Account Number: [id number] Date of Birth/Sex: Treating RN: 08/17/1970 (52 y.o. [name] Primary Care [name]: [name] Other Clinician: Valeria [name] Referring [name]: Treating [name]/Extender: [name], [name] in Treatment: 21 Vital Signs Time Taken: 14:06 Temperature (F): 97.5 [name], [name] ([id number]) [id number] Page 2 of 2 Height (in): 63 Pulse (bpm): 75 Weight (lbs): 170 Respiratory Rate (breaths/min): 18 Body Mass Index (BMI): 30.1 Blood Pressure (mmHg): 121/73 Reference Range: 80 - 120 mg / dl Electronic Signature(s) Signed: 09/23/2022 5:12:29 PM By: [name] EMT Entered By: [name] on 09/23/2022 17:12:29

## 2022-09-24 NOTE — Progress Notes (Signed)
ZIAIRE, GAAL ([id number]) [id number] Page 1 of 2 Visit Report for 09/23/2022 Problem List Details Patient Name: Date of Service: [name] 09/23/2022 2:00 PM Medical Record Number: [id number] Patient Account Number: [id number] Date of Birth/Sex: Treating RN: August 16, 1970 (52 y.o. 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 09/23/2022 Medical Record Number: [id number] Patient Account Number: [id number] Date of Birth/Sex: Treating RN: 08-02-1970 (52 y.o. 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 Facility Procedures : [name], [name] Code Description: [id number]-(Facility Use Only) HBOT full body chamber, 82min , [name] ([id number]) [id number] ICD-10 Diagnosis Description L59.8 Other specified disorders of the skin and subcutaneous tissue related to radia S21.002D  Unspecified open wound of left breast, subsequent encounter T81.31XD Disruption of external operation (surgical) wound, not elsewhere classified, s 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 Modifier: [id number] tion ubsequent enco Quantity: 4 [id number] Page 2 of 2 unter Physician Procedures : CPT4 Code Description Modifier [id number] - WC PHYS HYPERBARIC OXYGEN THERAPY ICD-10 Diagnosis Description 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

## 2022-09-24 NOTE — Progress Notes (Signed)
[name], CONTOIS ([id number]) [id number] Page 1 of 2 Visit Report for 09/23/2022 HBO Details Patient Name: Date of Service: [name] 09/23/2022 2:00 PM Medical Record Number: [id number] Patient Account Number: [id number] Date of Birth/Sex: Treating RN: [birth year] (52 y.o. 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 HBO Treatment Details Treatment Number: 34 Patient Type: Outpatient Chamber Type: Monoplace Chamber Serial #: [id number] Treatment Protocol: 2.0 ATA with 90 minutes oxygen, and no air breaks Treatment Details Compression Rate Down: 2.0 psi / minute De-Compression Rate Up: 2.0 psi / minute Air breaks and breathing Decompress Decompress Compress Tx Pressure Begins Reached periods Begins Ends (leave unused spaces blank) Chamber Pressure (ATA 1 2 ------2 1 ) Clock Time (24 hr) 14:39 14:48 - - - - - - 16:18 16:25 Treatment Length: 106 (minutes) Treatment Segments: 4 Vital Signs Capillary Blood Glucose Reference Range: 80 - 120 mg / dl HBO Diabetic Blood Glucose Intervention Range: <131 mg/dl or >249 mg/dl Time Vitals Blood Respiratory Capillary Blood Glucose Pulse Action Type: Pulse: Temperature: Taken: Pressure: Rate: Glucose (mg/dl): Meter #: Oximetry (%) Taken: Pre 14:06 121/73 75 18 97.5 Post 16:29 124/83 81 18 97.2 Treatment Response Treatment Toleration: Well Treatment Completion Status: Treatment Completed without Adverse Event Physician HBO Attestation: I certify that I supervised this HBO treatment in accordance with Medicare guidelines. A trained  emergency response team is readily available per Yes hospital policies and procedures. Continue HBOT as ordered. Yes Electronic Signature(s) Signed: 09/24/2022 7:49:35 AM By: [name] MD FACS Previous Signature: 09/23/2022 5:15:17 PM Version By: Valeria Batman EMT Entered By: [name] on 09/24/2022 07:49:34 -------------------------------------------------------------------------------- HBO Safety Checklist Details Patient Name: Date of Service: [name] RD, [name] NIE W. 09/23/2022 2:00 PM Medical Record Number: [id number] Patient Account Number: [id number] Date of Birth/Sex: Treating RN: 07/04/1971 (52 y.o. [name] [name]: [name] Other Clinician: Valeria Batman Referring Daylen Hack: Treating Krystofer Hevener/Extender: Rella Larve [place], [name] ([id number]) [id number] Page 2 of 2 Weeks in Treatment: 21 HBO Safety Checklist Items Safety Checklist Consent Form Signed Patient voided / foley secured and emptied When did you last eato 1200 Last dose of injectable or oral agent NA Ostomy pouch emptied and vented if applicable NA All implantable devices assessed, documented and approved NA Intravenous access site secured and place NA Valuables secured Linens and cotton and cotton/polyester blend (less than 51% polyester) Personal oil-based products / skin lotions / body lotions removed Wigs or hairpieces removed Smoking or tobacco materials removed Books / newspapers / magazines / loose paper removed Cologne, aftershave, perfume and deodorant removed Jewelry removed (may wrap wedding band) Make-up removed Hair care products removed Battery operated devices (external) removed Heating patches and chemical warmers removed Titanium eyewear removed NA Nail polish cured greater than 10 hours 09/17/2022 Casting material cured greater than 10 hours NA Hearing aids removed NA Loose dentures or partials  removed NA Prosthetics have been removed NA Patient demonstrates correct use of air break device (if applicable) Patient concerns have been addressed Patient grounding bracelet on and cord attached to chamber Specifics for Inpatients (complete in addition to above) Medication sheet sent with patient NA Intravenous medications needed or due during therapy sent with patient NA Drainage tubes (  e.g. nasogastric tube or chest tube secured and vented) NA Endotracheal or Tracheotomy tube secured NA Cuff deflated of air and inflated with saline NA Airway suctioned NA Notes The safety checklist was done prior to treatment starting. Electronic Signature(s) Signed: 09/23/2022 5:14:20 PM By: Valeria Batman EMT Entered By: Valeria Batman on 09/23/2022 17:14:19

## 2022-09-25 ENCOUNTER — Encounter (HOSPITAL_BASED_OUTPATIENT_CLINIC_OR_DEPARTMENT_OTHER): Payer: 59 | Admitting: General Surgery

## 2022-09-25 ENCOUNTER — Other Ambulatory Visit (HOSPITAL_COMMUNITY): Payer: Self-pay

## 2022-09-25 DIAGNOSIS — D0512 Intraductal carcinoma in situ of left breast: Secondary | ICD-10-CM | POA: Diagnosis not present

## 2022-09-25 DIAGNOSIS — L59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: Secondary | ICD-10-CM | POA: Diagnosis not present

## 2022-09-25 DIAGNOSIS — S21002D Unspecified open wound of left breast, subsequent encounter: Secondary | ICD-10-CM | POA: Diagnosis not present

## 2022-09-25 DIAGNOSIS — T8131XD Disruption of external operation (surgical) wound, not elsewhere classified, subsequent encounter: Secondary | ICD-10-CM | POA: Diagnosis not present

## 2022-09-25 DIAGNOSIS — Z8614 Personal history of Methicillin resistant Staphylococcus aureus infection: Secondary | ICD-10-CM | POA: Diagnosis not present

## 2022-09-26 NOTE — Progress Notes (Signed)
SCOTLYN, GOTSHALL ([id number]) [id number] Page 1 of 1 Visit Report for 09/25/2022 SuperBill Details Patient Name: Date of Service: [name] 09/25/2022 Medical Record Number: [id number] Patient Account Number: [id number] Date of Birth/Sex: Treating RN: [birth year] (52 y.o. 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 Facility Procedures CPT4 Code Description Modifier Quantity [id number] G0277-(Facility Use Only) HBOT full body chamber, 36min , 4 ICD-10 Diagnosis Description 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 S21.002D Unspecified open wound of left breast, subsequent encounter T81.31XD Disruption of external operation (surgical) wound, not elsewhere classified, subsequent encounter 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 Physician Procedures Quantity CPT4 Code Description Modifier [id number] - WC PHYS HYPERBARIC OXYGEN THERAPY 1 ICD-10 Diagnosis Description 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

## 2022-09-26 NOTE — Progress Notes (Signed)
TASHIYA, KRUPA ([id number]) [id number] Page 1 of 1 Visit Report for 09/24/2022 SuperBill Details Patient Name: Date of Service: [name] 09/24/2022 Medical Record Number: [id number] Patient Account Number: [id number] Date of Birth/Sex: Treating RN: 1970/09/30 (52 y.o. 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 Facility Procedures CPT4 Code Description Modifier Quantity [id number] G0277-(Facility Use Only) HBOT full body chamber, 16min , 4 ICD-10 Diagnosis Description 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 S21.002D Unspecified open wound of left breast, subsequent encounter T81.31XD Disruption of external operation (surgical) wound, not elsewhere classified, subsequent encounter 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 Physician Procedures Quantity CPT4 Code Description Modifier [id number] - WC PHYS HYPERBARIC OXYGEN THERAPY 1 ICD-10 Diagnosis Description 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

## 2022-09-26 NOTE — Progress Notes (Signed)
[name], [name] ([id number]) [id number] Page 1 of 2 Visit Report for 09/25/2022 HBO Details Patient Name: Date of Service: [name], [name] 09/25/2022 8:00 A M Medical Record Number: [id number] Patient Account Number: [id number] Date of Birth/Sex: Treating RN: 1971/06/16 (52 y.o. 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 HBO Treatment Details Treatment Number: 36 Patient Type: Outpatient Chamber Type: Monoplace Chamber Serial #: [id number] Treatment Protocol: 2.0 ATA with 90 minutes oxygen, and no air breaks Treatment Details Compression Rate Down: 1.5 psi / minute De-Compression Rate Up: 2.0 psi / minute Air breaks and breathing Decompress Decompress Compress Tx Pressure Begins Reached periods Begins Ends (leave unused spaces blank) Chamber Pressure (ATA 1 2 ------2 1 ) Clock Time (24 hr) 07:59 08:09 - - - - - - 09:39 09:47 Treatment Length: 108 (minutes) Treatment Segments: 4 Vital Signs Capillary Blood Glucose Reference Range: 80 - 120 mg / dl HBO Diabetic Blood Glucose Intervention Range: <131 mg/dl or >249 mg/dl Type: Time Vitals Blood Respiratory Capillary Blood Glucose Pulse Action Pulse: Temperature: Taken: Pressure: Rate: Glucose (mg/dl): Meter #: Oximetry (%) Taken: Pre 07:56 136/86 77 20 96.8 none per protocol Post 09:49 122/83 80 20 97.4 none per protocol Treatment Response Treatment Toleration: Well Treatment Completion Status: Treatment Completed without Adverse Event Treatment Notes [name] arrived, prepared for treatment. Vital signs were within normal  range. After performing a safety check, patient was placed in the chamber which was compressed with 100% oxygen at a rate of 2 psi/min after confirming normal ear equalization. Chamber was compressed at that rate until reaching treatment depth of 2.0 ATA. [name] tolerated treatment and subsequent decompression of the chamber at 2 pis/min. She denied any issues with ear equalization and/or pain. She was stable upon discharge. [name] has a wound care appointment after treatment today. Electronic Signature(s) Signed: 09/25/2022 4:19:08 PM By: [name] Maudlin MD FACS Previous Signature: 09/25/2022 2:45:49 PM Version By: [name] CHT EMT BS , , Previous Signature: 09/25/2022 1:39:45 PM Version By: [name] Maudlin MD FACS Previous Signature: 09/25/2022 8:36:08 AM Version By: [name] CHT EMT BS , , Previous Signature: 09/25/2022 8:35:03 AM Version By: [name] CHT EMT BS , , Entered By: [name] on 09/25/2022 16:19:08 -------------------------------------------------------------------------------- HBO Safety Checklist Details Patient Name: Date of Service: [name], [name] 09/25/2022 8:00 A M Medical Record Number: [id number] Patient Account Number: [id number] Date of Birth/Sex: Treating RN: 1971/01/12 (52 y.o. [name] Primary Care [name]: [name] Other Clinician: Imanni, [name] ([id number]) [id number] Page 2 of 2 Referring Syair Fricker: Treating [name]/Extender: [name], [name] in Treatment: 22 HBO Safety Checklist Items Safety Checklist Consent Form Signed Patient voided / foley secured and emptied When did you last eato 0630 Last dose of injectable or oral agent n/a Ostomy pouch emptied and vented if applicable NA All implantable devices assessed, documented and approved NA Intravenous access site secured and place NA Valuables secured Linens and cotton  and cotton/polyester blend (less than 51% polyester) Personal oil-based products / skin lotions / body lotions removed Wigs or hairpieces removed NA Smoking or tobacco materials removed NA Books / newspapers / magazines / loose paper removed Cologne, aftershave, perfume and deodorant  removed Jewelry removed (may wrap wedding band) Make-up removed Hair care products removed Battery operated devices (external) removed Heating patches and chemical warmers removed Titanium eyewear removed Nail polish cured greater than 10 hours greater than 10 hours Casting material cured greater than 10 hours NA Hearing aids removed NA Loose dentures or partials removed NA Prosthetics have been removed NA Patient demonstrates correct use of air break device (if applicable) Patient concerns have been addressed Patient grounding bracelet on and cord attached to chamber Specifics for Inpatients (complete in addition to above) Medication sheet sent with patient NA Intravenous medications needed or due during therapy sent with patient NA Drainage tubes (e.g. nasogastric tube or chest tube secured and vented) NA Endotracheal or Tracheotomy tube secured NA Cuff deflated of air and inflated with saline NA Airway suctioned NA Notes Paper version used prior to treatment start. Electronic Signature(s) Signed: 09/25/2022 8:32:53 AM By: [name] CHT EMT BS , , Entered By: [name] on 09/25/2022 08:32:53

## 2022-09-26 NOTE — Progress Notes (Signed)
LENELLE, BLAKESLEE ([id number]) [id number] Page 1 of 7 Visit Report for 09/25/2022 Arrival Information Details Patient Name: Date of Service: [name] 09/25/2022 10:15 A M Medical Record Number: [id number] Patient Account Number: [id number] Date of Birth/Sex: Treating RN: [birth year] (52 y.o. [name] Areej Tayler: [name] Other Clinician: Referring Adahlia Stembridge: Treating Pattijo Juste/Extender: Shea Stakes in Treatment: 85 Visit Information History Since Last Visit Added or deleted any medications: No Patient Arrived: Ambulatory Any new allergies or adverse reactions: No Arrival Time: 10:15 Had a fall or experienced change in No Accompanied By: self activities of daily living that may affect Transfer Assistance: None risk of falls: Patient Identification Verified: Yes Signs or symptoms of abuse/neglect since last visito No Secondary Verification Process Completed: Yes Hospitalized since last visit: No Patient Requires Transmission-Based Precautions: No Implantable device outside of the clinic excluding No Patient Has Alerts: No cellular tissue based products placed in the center since last visit: Has Dressing in Place as Prescribed: Yes Pain Present Now: No Electronic Signature(s) Signed: 09/25/2022 3:34:35 PM By: [name] Entered By: [name] on 09/25/2022 10:15:40 -------------------------------------------------------------------------------- Clinic Level of Care Assessment Details Patient Name: Date of Service: [name] 09/25/2022 10:15 A M Medical Record Number: [id number] Patient Account Number: [id number] Date of Birth/Sex: Treating RN: 1971/05/13 (52 y.o. [name] Ellington Cornia: [name] Other Clinician: Referring Gearldean Lomanto: Treating Kerman Pfost/Extender: Shea Stakes in Treatment: 22 Clinic Level of Care  Assessment Items TOOL 4 Quantity Score X- 1 0 Use when only an EandM is performed on FOLLOW-UP visit ASSESSMENTS - Nursing Assessment / Reassessment X- 1 10 Reassessment of Co-morbidities (includes updates in patient status) X- 1 5 Reassessment of Adherence to Treatment Plan ASSESSMENTS - Wound and Skin A ssessment / Reassessment X - Simple Wound Assessment / Reassessment - one wound 1 5 []  - 0 Complex Wound Assessment / Reassessment - multiple wounds []  - 0 Dermatologic / Skin Assessment (not related to wound area) ASSESSMENTS - Focused Assessment []  - 0 Circumferential Edema Measurements - multi extremities []  - 0 Nutritional Assessment / Counseling / Intervention []  - 0 Lower Extremity Assessment (monofilament, tuning fork, pulses) []  - 0 Peripheral Arterial Disease Assessment (using hand held doppler) ASSESSMENTS - Ostomy and/or Continence Assessment and Care []  - 0 Incontinence Assessment and Management []  - 0 Ostomy Care Assessment and Management (repouching, etc.) PROCESS - Coordination of Care X - Simple Patient / Family Education for ongoing care 1 15 ELTA, KAZAR ([id number]) [id number] Page 2 of 7 []  - 0 Complex (extensive) Patient / Family Education for ongoing care X- 1 10 Staff obtains Programmer, systems, Records, T Results / Process Orders est []  - 0 Staff telephones HHA, Nursing Homes / Clarify orders / etc []  - 0 Routine Transfer to another Facility (non-emergent condition) []  - 0 Routine Hospital Admission (non-emergent condition) []  - 0 New Admissions / Biomedical engineer / Ordering NPWT Apligraf, etc. , []  - 0 Emergency Hospital Admission (emergent condition) X- 1 10 Simple Discharge Coordination []  - 0 Complex (extensive) Discharge Coordination PROCESS - Special Needs []  - 0 Pediatric / Minor Patient Management []  - 0 Isolation Patient Management []  - 0 Hearing / Language / Visual special needs []  -  0 Assessment of Community assistance (transportation, D/C planning, etc.) []  - 0 Additional assistance / Altered mentation []  - 0 Support Surface(s) Assessment (bed, cushion, seat, etc.) INTERVENTIONS - Wound Cleansing / Measurement X - Simple  Wound Cleansing - one wound 1 5 []  - 0 Complex Wound Cleansing - multiple wounds X- 1 5 Wound Imaging (photographs - any number of wounds) []  - 0 Wound Tracing (instead of photographs) X- 1 5 Simple Wound Measurement - one wound []  - 0 Complex Wound Measurement - multiple wounds INTERVENTIONS - Wound Dressings X - Small Wound Dressing one or multiple wounds 1 10 []  - 0 Medium Wound Dressing one or multiple wounds []  - 0 Large Wound Dressing one or multiple wounds []  - 0 Application of Medications - topical []  - 0 Application of Medications - injection INTERVENTIONS - Miscellaneous []  - 0 External ear exam []  - 0 Specimen Collection (cultures, biopsies, blood, body fluids, etc.) []  - 0 Specimen(s) / Culture(s) sent or taken to Lab for analysis []  - 0 Patient Transfer (multiple staff / Civil Service fast streamer / Similar devices) []  - 0 Simple Staple / Suture removal (25 or less) []  - 0 Complex Staple / Suture removal (26 or more) []  - 0 Hypo / Hyperglycemic Management (close monitor of Blood Glucose) []  - 0 Ankle / Brachial Index (ABI) - do not check if billed separately X- 1 5 Vital Signs Has the patient been seen at the hospital within the last three years: Yes Total Score: 85 Level Of Care: New/Established - Level 3 Electronic Signature(s) Signed: 09/25/2022 3:34:35 PM By: [name] Entered By: [name] on 09/25/2022 10:23:45 [name] ([id number]) [id number] Page 3 of 7 -------------------------------------------------------------------------------- Encounter Discharge Information Details Patient Name: Date of Service: [name] 09/25/2022 10:15 A M Medical Record  Number: [id number] Patient Account Number: [id number] Date of Birth/Sex: Treating RN: 05-15-1971 (52 y.o. [name] Chasta Deshpande: [name] Other Clinician: Referring Olie Scaffidi: Treating Yarieliz Wasser/Extender: Shea Stakes in Treatment: 22 Encounter Discharge Information Items Discharge Condition: Stable Ambulatory Status: Ambulatory Discharge Destination: Home Transportation: Private Auto Accompanied By: self Schedule Follow-up Appointment: Yes Clinical Summary of Care: Patient Declined Electronic Signature(s) Signed: 09/25/2022 3:34:35 PM By: Sabas Sous By: [name] on 09/25/2022 10:25:01 -------------------------------------------------------------------------------- Lower Extremity Assessment Details Patient Name: Date of Service: [name] 09/25/2022 10:15 A M Medical Record Number: [id number] Patient Account Number: [id number] Date of Birth/Sex: Treating RN: 1971-04-14 (52 y.o. [name] Chole Driver: [name] Other Clinician: Referring Kazuma Elena: Treating Kali Ambler/Extender: [name], [name] Weeks in Treatment: 22 Electronic Signature(s) Signed: 09/25/2022 3:34:35 PM By: Sabas Sous By: [name] on 09/25/2022 10:15:49 -------------------------------------------------------------------------------- Multi Wound Chart Details Patient Name: Date of Service: [name], [name] 09/25/2022 10:15 A M Medical Record Number: [id number] Patient Account Number: [id number] Date of Birth/Sex: Treating RN: March 25, 1971 (52 y.o. F) Primary Care Jeneane Pieczynski: [name] Other Clinician: Referring Aniqua Briere: Treating Jalah Warmuth/Extender: [name], [name] Weeks in Treatment: 22 [1:Photos:] [N/A:N/A] Left Breast N/A N/A Wound Location: Surgical Injury N/A N/A Wounding Event: Malignant Wound N/A N/A Primary Etiology: Lymphedema N/A  N/A Comorbid History: 04/03/2022 N/A N/A Date Acquired: 22 N/A N/A Weeks of Treatment: Open N/A N/A Wound Status: No N/A N/A Wound Recurrence: 0.2x0.2x0.1 N/A N/A Measurements L x W x D (cm) CHELSEAMARIE, HOLLINGHEAD ([id number]) [id number] Page 4 of 7 0.031 N/A N/A A (cm) : rea 0.003 N/A N/A Volume (cm) : 99.80% N/A N/A % Reduction in Area: 99.90% N/A N/A % Reduction in Volume: Full Thickness Without Exposed N/A N/A Classification: Support Structures Small N/A N/A Exudate Amount: Serous N/A N/A Exudate Type: amber N/A N/A Exudate  Color: Distinct, outline attached N/A N/A Wound Margin: Large (67-100%) N/A N/A Granulation Amount: Red, Pink N/A N/A Granulation Quality: Small (1-33%) N/A N/A Necrotic Amount: Fat Layer (Subcutaneous Tissue): Yes N/A N/A Exposed Structures: Fascia: No Tendon: No Muscle: No Joint: No Bone: No Large (67-100%) N/A N/A Epithelialization: Induration: Yes N/A N/A Periwound Skin Texture: No Abnormalities Noted N/A N/A Periwound Skin Moisture: No Abnormalities Noted N/A N/A Periwound Skin Color: No Abnormality N/A N/A Temperature: Treatment Notes Wound #1 (Breast) Wound Laterality: Left Cleanser Normal Saline Discharge Instruction: Cleanse the wound with Normal Saline prior to applying a clean dressing using gauze sponges, not tissue or cotton balls. Soap and Water Discharge Instruction: May shower and wash wound with dial antibacterial soap and water prior to dressing change. Byram Ancillary Kit - 15 Day Supply Discharge Instruction: Use supplies as instructed; Kit contains: (15) Saline Bullets; (15) 3x3 Gauze; 15 pr Gloves Peri-Wound Care Topical Primary Dressing Promogran Prisma Matrix, 4.34 (sq in) (silver collagen) Discharge Instruction: Moisten collagen with saline or hydrogel Secondary Dressing Zetuvit Plus Silicone Border Dressing 4x4 (in/in) Discharge Instruction: Apply silicone border over  primary dressing as directed. Secured With Compression Wrap Compression Stockings Environmental education officer) Signed: 09/25/2022 11:42:32 AM By: [name] MD FACS Entered By: [name] on 09/25/2022 11:42:32 -------------------------------------------------------------------------------- Multi-Disciplinary Care Plan Details Patient Name: Date of Service: [name], [name] NIE W. 09/25/2022 10:15 A M Medical Record Number: [id number] Patient Account Number: [id number] Date of Birth/Sex: Treating RN: [birth year] (52 y.o. [name] Karilynn Carranza: [name] Other Clinician: Referring Jirah Rider: Treating Fawna Cranmer/Extender: Shea Stakes in Treatment: 22 Multidisciplinary Care Plan reviewed with physician [address] TAMERIA, GIVANS ([id number]) [id number] Page 5 of 7 Wound/Skin Impairment Nursing Diagnoses: Impaired tissue integrity Goals: Patient/caregiver will verbalize understanding of skin care regimen Date Initiated: 04/24/2022 Target Resolution Date: 10/02/2022 Goal Status: Active Interventions: Assess ulceration(s) every visit Treatment Activities: Skin care regimen initiated : 04/24/2022 Notes: Electronic Signature(s) Signed: 09/25/2022 3:34:35 PM By: [name] Entered By: [name] on 09/25/2022 10:22:43 -------------------------------------------------------------------------------- Pain Assessment Details Patient Name: Date of Service: [name] 09/25/2022 10:15 A M Medical Record Number: [id number] Patient Account Number: [id number] Date of Birth/Sex: Treating RN: 12-May-1971 (52 y.o. [name] Jaleesa Cervi: [name] Other Clinician: Referring Court Gracia: Treating Charleigh Correnti/Extender: [name], [name] Weeks in Treatment: 22 Active Problems Location of Pain Severity and Description of Pain Patient Has Paino  No Site Locations Rate the pain. Current Pain Level: 0 Pain Management and Medication Current Pain Management: Electronic Signature(s) Signed: 09/25/2022 3:34:35 PM By: [name] Entered By: [name] on 09/25/2022 10:15:46 -------------------------------------------------------------------------------- Patient/Caregiver Education Details Patient Name: Date of Service: [name] RD, STEPHA NIE W. 3/22/2024andnbsp10:15 A M Medical Record Number: [id number] Patient Account Number: [id number] Date of Birth/Gender: Treating RN: 1971-05-24 (52 y.o. [address][name] [place], [place] ([id number]) [phone].pdf Page 6 of 7 Primary Care Physician: [name] Other Clinician: Referring Physician: Treating Physician/Extender: Shea Stakes in Treatment: 22 Education Assessment Education Provided To: Patient Education Topics Provided Safety: Methods: Explain/Verbal Responses: Reinforcements needed, State content correctly Electronic Signature(s) Signed: 09/25/2022 3:34:35 PM By: [name] Entered By: [name] on 09/25/2022 10:23:06 -------------------------------------------------------------------------------- Wound Assessment Details Patient Name: Date of Service: [name] 09/25/2022 10:15 A M Medical Record Number: [id number] Patient Account Number: [id number] Date of Birth/Sex: Treating RN: [birth year] (52 y.o. 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 Wound Status: Open Wounding Event: Surgical Injury Comorbid History: Lymphedema Date Acquired: 04/03/2022 Weeks Of Treatment: 22 Clustered Wound: No Photos Wound Measurements Length: (cm) Width:  (cm) Depth: (cm) Area: (cm) Volume: (cm) 0.2 % Reduction in Area: 99.8% 0.2 % Reduction in Volume: 99.9% 0.1 Epithelialization: Large (67-100%) 0.031 Tunneling: No 0.003 Undermining: No Wound Description Classification: Full Thickness Without Exposed Suppo Wound Margin: Distinct, outline attached Exudate Amount: Small Exudate Type: Serous Exudate Color: amber rt Structures Foul Odor After Cleansing: No Slough/Fibrino Yes Wound Bed Granulation Amount: Large (67-100%) Exposed Structure Granulation Quality: Red, Pink Fascia Exposed: No Necrotic Amount: Small (1-33%) Fat Layer (Subcutaneous Tissue) Exposed: Yes DONNALYNN, SIRLES ([id number]) [id number] Page 7 of 7 Necrotic Quality: Adherent Slough Tendon Exposed: No Muscle Exposed: No Joint Exposed: No Bone Exposed: No Periwound Skin Texture Texture Color No Abnormalities Noted: No No Abnormalities Noted: Yes Induration: Yes Temperature / Pain Temperature: No Abnormality Moisture No Abnormalities Noted: Yes Treatment Notes Wound #1 (Breast) Wound Laterality: Left Cleanser Normal Saline Discharge Instruction: Cleanse the wound with Normal Saline prior to applying a clean dressing using gauze sponges, not tissue or cotton balls. Soap and Water Discharge Instruction: May shower and wash wound with dial antibacterial soap and water prior to dressing change. Byram Ancillary Kit - 15 Day Supply Discharge Instruction: Use supplies as instructed; Kit contains: (15) Saline Bullets; (15) 3x3 Gauze; 15 pr Gloves Peri-Wound Care Topical Primary Dressing Promogran Prisma Matrix, 4.34 (sq in) (silver collagen) Discharge Instruction: Moisten collagen with saline or hydrogel Secondary Dressing Zetuvit Plus Silicone Border Dressing 4x4 (in/in) Discharge Instruction: Apply silicone border over primary dressing as directed. Secured With Compression Wrap Compression Stockings Sport and exercise psychologist) Signed: 09/25/2022 3:34:35 PM By: [name] Entered By: [name] on 09/25/2022 10:14:05

## 2022-09-26 NOTE — Progress Notes (Signed)
[name], [name] ([id number]) [id number] Page 1 of 8 Visit Report for 09/25/2022 Chief Complaint Document Details Patient Name: Date of Service: [name] 09/25/2022 10:15 A M Medical Record Number: [id number] Patient Account Number: [id number] Date of Birth/Sex: Treating RN: 1971-01-27 (51 y.o. F) Primary Care Provider: Early [name] Other Clinician: Referring Provider: Treating Provider/Extender: [name], [name] in Treatment: 22 Information Obtained from: Patient Chief Complaint 04/24/2022; patient is here for a surgical wound on her left lateral breast Electronic Signature(s) Signed: 09/25/2022 11:42:38 AM By: [name] MD FACS Entered By: [name] on 09/25/2022 11:42:38 -------------------------------------------------------------------------------- HPI Details Patient Name: Date of Service: [name] RD, [name] Bushy NIE W. 09/25/2022 10:15 A M Medical Record Number: [id number] Patient Account Number: [id number] Date of Birth/Sex: Treating RN: [birth year] (52 y.o. F) Primary Care Provider: Early [name] Other Clinician: Referring Provider: Treating Provider/Extender: [name], [name] in Treatment: 22 History of Present Illness HPI Description: ADMISSION 05/25/2022 This is a 51 year old woman who is found to have an abnormal mammogram of her left breast earlier this year showing a 13 mm group of calcifications in the upper quadrant of the left breast. 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 She underwent a left lumpectomy on 11/12/2021. The patient states the wound never really healed and was open. She underwent radiation therapy from 12/10/2021 through 01/19/22 28 fractions of 1.8GY. Essentially the wound would not heal. She was treated several times with antibiotics finally on 03/25/2022 she was taken to the OR by Dr.  Barry [name] for excision of the wound in a sinus. Operative cultures grew strep and Prevotella and a culture from the clinic Livedo MRSA. It was recommended for 4 [name] of Augmentin and doxycycline by infectious disease. She was seen by wound care and she has been treating the wound with Medihoney ever since. 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 She was on meth methotrexate 20 mg once a week however this I think was put on hold because of the wound followed by [name] of rheumatology. She has asthma. Most recent breast ultrasound was on 9/7 She has a large nonhealing wound on the lateral aspect of her left breast 04/30/2022: Although I do not have a photograph to compare to last week, the RN report is that it is substantially cleaner. She still has ample slough and nonviable fat and subcutaneous tissue present. She only was able to get her Santyl on Monday so she has just had a couple of days of treatment. 05/08/2022: The wound dimensions are about the same, but the surface is substantially cleaner. There is just a little bit of slough accumulation on the surface. 05/18/2022: The wound is a little bit smaller, but not much. It is quite a bit cleaner, however. 05/25/2022: The wound surface continues to improve. Very little slough accumulation this week. No substantial change in the wound dimensions. 06/01/2022: The wound surface continues to have a layer of slough on it. There is a crack in the surface near the 12 o'clock position that when further explored, revealed a cavity and tunnel that is about 2 cm deep. No purulent drainage or concern for infection. 12/11; left breast wound. The wound itself is somewhat smaller in size  per our measurements. However she  has a divot at 1:00 that measures about a 1.5 cm deep. We have been using iodoform packing and if it and Santyl to the rest of the wound This wound was initially had a left lumpectomy on 11/12/2021 she underwent 28 fractions of radiation. She potentially could benefit from hyperbaric oxygen 12/18; left breast wound. Better looking wound surface and improvement in measurements of the small tunnel. We have been using Santyl and iodoform. 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 Underneath the layer of slough, the tissue is much healthier-looking, with better color and some granulation tissue beginning to form. 07/13/2022: The wound continues to fill with better looking granulation tissue. There is very little slough accumulation and the cavity is shallower. We are going to submit for insurance approval for hyperbaric oxygen therapy, as it has now been 6 months since the time of her initial wound and radiation therapy. [name] ([id number]) [id number] Page 2 of 8 07/20/2022: The cavity continues to fill in and the surface of the wound is improving. EKG and chest x-ray are complete and we are just awaiting insurance approval for hyperbarics. 07/27/2022: The cavity has filled in considerably and the surface of the wound is very clean with just a thin layer of light slough. We are still awaiting insurance approval for hyperbaric oxygen therapy. 08/03/2022: She was approved for hyperbaric oxygen therapy and received 2 treatments last week. It is rather remarkable the improvement already in her wound. The cavity continues to fill and there is epithelialization occurring around the edges of her wound. Minimal slough accumulation. 08/13/2022: Her wound is smaller and is  epithelializing. There is no longer a tunnel or tract to pack. There is just slight slough accumulation on the surface. 08/19/2022: Her wound continues to contract and epithelialize. Light slough and eschar on the remaining open areas. 08/26/2022: Her wound is smaller by about half this week. There is still some slough accumulation on the surface. 09/02/2022: There has been further epithelialization of her wound. It is nearly completely flush with the surrounding skin. Minimal slough accumulation. 09/09/2022: The wound is smaller again this week. There has been circumferential epithelialization. There is slough on the remaining wound surface. 09/25/2022: Her wound is down to just a very small superficial opening of a couple of millimeters. Electronic Signature(s) Signed: 09/25/2022 11:43:02 AM By: [name] MD FACS Entered By: [name] on 09/25/2022 11:43:02 -------------------------------------------------------------------------------- Physical Exam Details Patient Name: Date of Service: [name] 09/25/2022 10:15 A M Medical Record Number: [id number] Patient Account Number: [id number] Date of Birth/Sex: Treating RN: [birth year] (52 y.o. F) Primary Care Provider: Early [name] Other Clinician: Referring Provider: Treating Provider/Extender: [name], [name] in Treatment: 22 Constitutional no acute distress. Respiratory Normal work of breathing on room air. Notes 09/25/2022: Her wound is down to just a very small superficial opening of a couple of millimeters. Electronic Signature(s) Signed: 09/25/2022 11:43:25 AM By: [name] MD FACS Entered By: [name] on 09/25/2022 11:43:25 -------------------------------------------------------------------------------- Physician Orders Details Patient Name: Date of Service: [name] 09/25/2022 10:15 A M Medical Record Number: [id number] Patient Account Number: [id number] Date of  Birth/Sex: Treating RN: [birth year] (52 y.o. [name] Provider: Early [name] Other Clinician: Referring  Provider: Treating Provider/Extender: [name], [name] in Treatment: 84 Verbal / Phone Orders: No Diagnosis Coding ICD-10 Coding Code Description S21.002D Unspecified open wound of left breast, subsequent encounter T81.31XD Disruption of external operation (surgical) wound, not elsewhere classified, subsequent encounter 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 Follow-up Appointments ppointment in 1 week. - Dr. Celine Ahr Room 2 Return A LAKELYNN, RASOR ([id number]) [id number] Page 3 of 8 Anesthetic (In clinic) Topical Lidocaine 4% applied to wound bed Bathing/ Shower/ Hygiene Other Bathing/Shower/Hygiene Orders/Instructions: - Change dressing after bathing Edema Control - Lymphedema / SCD / Other Other Edema Control Orders/Instructions: - Keep doing the Lymphadema checks Hyperbaric Oxygen Therapy Discontinue HBO Therapy Wound Treatment Wound #1 - Breast Wound Laterality: Left Cleanser: Normal Saline (Generic) 1 x Per Day/15 Days Discharge Instructions: Cleanse the wound with Normal Saline prior to applying a clean dressing using gauze sponges, not tissue or cotton balls. Cleanser: Soap and Water 1 x Per Day/15 Days Discharge Instructions: May shower and wash wound with dial antibacterial soap and water prior to dressing change. Cleanser: Byram Ancillary Kit - 15 Day Supply (Generic) 1 x Per Day/15 Days Discharge Instructions: Use supplies as instructed; Kit contains: (15) Saline Bullets; (15) 3x3 Gauze; 15 pr Gloves Prim Dressing: Promogran Prisma Matrix, 4.34 (sq in) (silver collagen) (Dispense As Written) 1 x Per Day/15 Days ary Discharge Instructions: Moisten collagen with saline or hydrogel Secondary  Dressing: Zetuvit Plus Silicone Border Dressing 4x4 (in/in) (Generic) 1 x Per Day/15 Days Discharge Instructions: Apply silicone border over primary dressing as directed. Patient Medications llergies: kiwi, Ativan, erythromycin base, Xanax, nitrofurantoin A Notifications Medication Indication Start End 09/25/2022 lidocaine DOSE topical 4 % cream - cream topical Electronic Signature(s) Signed: 09/25/2022 1:35:04 PM By: [name] MD FACS Entered By: [name] on 09/25/2022 11:44:55 -------------------------------------------------------------------------------- Problem List Details Patient Name: Date of Service: [name] 09/25/2022 10:15 A M Medical Record Number: [id number] Patient Account Number: [id number] Date of Birth/Sex: Treating RN: 1970-09-28 (52 y.o. F) Primary Care Provider: Early [name] Other Clinician: Referring Provider: Treating Provider/Extender: [name], [name] in Treatment: 22 Active Problems ICD-10 Encounter Code Description Active Date MDM Diagnosis S21.002D Unspecified open wound of left breast, subsequent encounter 04/24/2022 No Yes T81.31XD Disruption of external operation (surgical) wound, not elsewhere classified, 04/24/2022 No Yes subsequent encounter L59.8 Other specified disorders of the skin and subcutaneous tissue related to 04/24/2022 No Yes radiation CYNIYAH, ALTERGOTT ([id number]) [phone].pdf Page 4 of 8 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 04/24/2022 No Yes Inactive Problems Resolved Problems Electronic Signature(s) Signed: 09/25/2022 11:42:26 AM By: [name] MD FACS Entered By: [name] on 09/25/2022 11:42:25 -------------------------------------------------------------------------------- Progress Note Details Patient Name: Date of Service: [name] 09/25/2022 10:15 A M Medical Record Number: [id number] Patient Account Number:  [id number] Date of Birth/Sex: Treating RN: May 07, 1971 (52 y.o. F) Primary Care Provider: Early [name] Other Clinician: Referring Provider: Treating Provider/Extender: [name], [name] in Treatment: 22 Subjective Chief Complaint Information obtained from Patient 04/24/2022; patient is here for a surgical wound on her left lateral breast History of Present Illness (HPI) ADMISSION 05/25/2022 This is a 52 year old woman who is found to have an abnormal mammogram of her left breast earlier this year showing a 13 mm group of calcifications in the upper quadrant of the left breast. A biopsy was positive for ductal carcinoma in situ with  high-grade comedo necrosis and a small margin of invasiveness. She underwent a left lumpectomy on 11/12/2021. The patient states the wound never really healed and was open. She underwent radiation therapy from 12/10/2021 through 01/19/22 28 fractions of 1.8GY. Essentially the wound would not heal. She was treated several times with antibiotics finally on 03/25/2022 she was taken to the OR by [name] for excision of the wound in a sinus. Operative cultures grew strep and Prevotella and a culture from the clinic [place] MRSA. It was recommended for 4 [name] of Augmentin and doxycycline by infectious disease. She was seen by wound care and she has been treating the wound with Medihoney ever since. 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 She was on meth methotrexate 20 mg once a week however this I think was put on hold because of the wound followed by [name] of rheumatology. She has asthma. Most recent breast ultrasound was on 9/7 She has a large nonhealing wound on the lateral aspect of her left  breast 04/30/2022: Although I do not have a photograph to compare to last week, the RN report is that it is substantially cleaner. She still has ample slough and nonviable fat and subcutaneous tissue present. She only was able to get her Santyl on Monday so she has just had a couple of days of treatment. 05/08/2022: The wound dimensions are about the same, but the surface is substantially cleaner. There is just a little bit of slough accumulation on the surface. 05/18/2022: The wound is a little bit smaller, but not much. It is quite a bit cleaner, however. 05/25/2022: The wound surface continues to improve. Very little slough accumulation this week. No substantial change in the wound dimensions. 06/01/2022: The wound surface continues to have a layer of slough on it. There is a crack in the surface near the 12 o'clock position that when further explored, revealed a cavity and tunnel that is about 2 cm deep. No purulent drainage or concern for infection. 12/11; left breast wound. The wound itself is somewhat smaller in size per our measurements. However she has a divot at 1:00 that measures about a 1.5 cm deep. We have been using iodoform packing and if it and Santyl to the rest of the wound This wound was initially had a left lumpectomy on 11/12/2021 she underwent 28 fractions of radiation. She potentially could benefit from hyperbaric oxygen 12/18; left breast wound. Better looking wound surface and improvement in measurements of the small tunnel. We have been using Santyl and iodoform. 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 Underneath the layer of slough, the tissue is much healthier-looking, with better color and some granulation tissue beginning to form. 07/13/2022: The wound continues to  fill with better looking granulation tissue. There is very little slough accumulation and the cavity is shallower. We are going to submit for insurance approval for hyperbaric oxygen therapy, as it has now been 6 months since the time of her initial wound and radiation therapy. 07/20/2022: The cavity continues to fill in and the surface of the wound is improving.  EKG and chest x-ray are complete and we are just awaiting insurance approval for hyperbarics. 07/27/2022: The cavity has filled in considerably and the surface of the wound is very clean with just a thin layer of light slough. We are still awaiting insurance approval for hyperbaric oxygen therapy. 08/03/2022: She was approved for hyperbaric oxygen therapy and received 2 treatments last week. It is rather remarkable the improvement already in her wound. The cavity continues to fill and there is epithelialization occurring around the edges of her wound. Minimal slough accumulation. [name], [name] ([id number]) [id number] Page 5 of 8 08/13/2022: Her wound is smaller and is epithelializing. There is no longer a tunnel or tract to pack. There is just slight slough accumulation on the surface. 08/19/2022: Her wound continues to contract and epithelialize. Light slough and eschar on the remaining open areas. 08/26/2022: Her wound is smaller by about half this week. There is still some slough accumulation on the surface. 09/02/2022: There has been further epithelialization of her wound. It is nearly completely flush with the surrounding skin. Minimal slough accumulation. 09/09/2022: The wound is smaller again this week. There has been circumferential epithelialization. There is slough on the remaining wound surface. 09/25/2022: Her wound is down to just a very small superficial opening of a couple of millimeters. Patient History Information obtained from Patient. Social History Never smoker, Marital Status - Married, Alcohol  Use - Moderate, Drug Use - No History, Caffeine Use - Daily. Medical History Hematologic/Lymphatic Patient has history of Lymphedema - Left breast-gets Lymphadema tx Hospitalization/Surgery History - 03/25/22 Left Breast cyst excision; 11/12/21 Left Breast Lumpectomy with Radioactive seeds;Left breat Biopsy;Anterior. 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 12/08/21- 01/29/22- 37 rounds of Radiation Psychiatric General Anxiety Objective Constitutional no acute distress. Respiratory Normal work of breathing on room air. General Notes: 09/25/2022: Her wound is down to just a very small superficial opening of a couple of millimeters. Integumentary (Hair, Skin) Wound #1 status is Open. Original cause of wound was Surgical Injury. The date acquired was: 04/03/2022. The wound has been in treatment 22 [name] The wound is located on the Left Breast. The wound measures 0.2cm length x 0.2cm width x 0.1cm depth; 0.031cm^2 area and 0.003cm^3 volume. There is Fat Layer (Subcutaneous Tissue) exposed. There is no tunneling or undermining noted. There is a small amount of serous drainage noted. The wound margin is distinct with the outline attached to the wound base. There is large (67-100%) red, pink granulation within the wound bed. There is a small (1-33%) amount of necrotic tissue within the wound bed including Adherent Slough. The periwound skin appearance had no abnormalities noted for moisture. The periwound skin appearance had no abnormalities noted for color. The periwound skin appearance exhibited: Induration. Periwound temperature was noted as No Abnormality. Assessment Active Problems ICD-10 Unspecified open wound of left breast, subsequent encounter Disruption of external operation (surgical) wound, not elsewhere  classified, subsequent encounter Other specified disorders of the skin and subcutaneous tissue related to radiation Intraductal carcinoma in situ of left breast Plan [name], [name] ([id number]) [id number] Page 6 of 8 Follow-up Appointments: Return Appointment in 1 week. - Dr. Celine Ahr Room 2 Anesthetic: (In clinic) Topical Lidocaine 4% applied to wound bed Bathing/ Shower/ Hygiene: Other Bathing/Shower/Hygiene Orders/Instructions: - Change dressing after bathing Edema Control - Lymphedema / SCD / Other: Other Edema Control Orders/Instructions: - Keep doing the Lymphadema checks  Hyperbaric Oxygen Therapy: Discontinue HBO Therapy The following medication(s) was prescribed: lidocaine topical 4 % cream cream topical was prescribed at facility WOUND #1: - Breast Wound Laterality: Left Cleanser: Normal Saline (Generic) 1 x Per Day/15 Days Discharge Instructions: Cleanse the wound with Normal Saline prior to applying a clean dressing using gauze sponges, not tissue or cotton balls. Cleanser: Soap and Water 1 x Per Day/15 Days Discharge Instructions: May shower and wash wound with dial antibacterial soap and water prior to dressing change. Cleanser: Byram Ancillary Kit - 15 Day Supply (Generic) 1 x Per Day/15 Days Discharge Instructions: Use supplies as instructed; Kit contains: (15) Saline Bullets; (15) 3x3 Gauze; 15 pr Gloves Prim Dressing: Promogran Prisma Matrix, 4.34 (sq in) (silver collagen) (Dispense As Written) 1 x Per Day/15 Days ary Discharge Instructions: Moisten collagen with saline or hydrogel Secondary Dressing: Zetuvit Plus Silicone Border Dressing 4x4 (in/in) (Generic) 1 x Per Day/15 Days Discharge Instructions: Apply silicone border over primary dressing as directed. 09/25/2022: Her wound is down to just a very small superficial opening of a couple of millimeters. No debridement was necessary today. We will continue Prisma silver collagen with a foam  border dressing. She has 4 hyperbaric oxygen treatments remaining, but she has requested to stop at this point; I think that is fine given her remarkable response to the treatment she has received. Order written to discontinue HBOT Follow-up in 1 week, at which time I anticipate she will likely be completely healed. . Electronic Signature(s) Signed: 09/25/2022 11:45:59 AM By: [name] MD FACS Entered By: [name] on 09/25/2022 11:45:59 -------------------------------------------------------------------------------- HxROS Details Patient Name: Date of Service: [name] RD, [name] Bushy NIE W. 09/25/2022 10:15 A M Medical Record Number: [id number] Patient Account Number: [id number] Date of Birth/Sex: Treating RN: Nov 29, 1970 (52 y.o. F) Primary Care Provider: Early [name] Other Clinician: Referring Provider: Treating Provider/Extender: Shea Stakes in Treatment: 22 Information Obtained From Patient Ear/Nose/Mouth/Throat Medical History: Past Medical History Notes: Left ear limited hearing Hematologic/Lymphatic Medical History: Positive for: Lymphedema - Left breast-gets Lymphadema tx Gastrointestinal Medical History: Past Medical History Notes: GERD Immunological Medical History: Past Medical History Notes: Wegener"s Granulomatosis Musculoskeletal Medical History: Past Medical History NotesYOCELIN, PLACK ([id number]) [id number] Page 7 of 8 "RA like arthritis" as per patient Oncologic Medical History: Past Medical History Notes: Left Breast. 12/08/21- 01/29/22- 37 rounds of Radiation Psychiatric Medical History: Past Medical History Notes: General Anxiety Immunizations Pneumococcal Vaccine: Received Pneumococcal Vaccination: Yes Received Pneumococcal Vaccination On or After 60th Birthday: No Implantable Devices Yes Hospitalization / Surgery History Type of Hospitalization/Surgery 03/25/22 Left Breast cyst  excision; 11/12/21 Left Breast Lumpectomy with Radioactive seeds;Left breat Biopsy;Anterior Family and Social History Never smoker; Marital Status - Married; Alcohol Use: Moderate; Drug Use: No History; Caffeine Use: Daily; Financial Concerns: No; Food, Clothing or Shelter Needs: No; Support System Lacking: No; Transportation Concerns: No Electronic Signature(s) Signed: 09/25/2022 1:35:04 PM By: [name] MD FACS Entered By: [name] on 09/25/2022 11:43:09 -------------------------------------------------------------------------------- SuperBill Details Patient Name: Date of Service: [name] 09/25/2022 Medical Record Number: [id number] Patient Account Number: [id number] Date of Birth/Sex: Treating RN: July 03, 1971 (52 y.o. 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

## 2022-09-26 NOTE — Progress Notes (Signed)
MEIKO, DEFELICE ([id number]) [id number] Page 1 of 2 Visit Report for 09/24/2022 HBO Details Patient Name: Date of Service: [name], [name] 09/24/2022 8:00 A M Medical Record Number: [id number] Patient Account Number: [id number] Date of Birth/Sex: Treating RN: 07-Feb-1971 (52 y.o. 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 HBO Treatment Details Treatment Number: 35 Patient Type: Outpatient Chamber Type: Monoplace Chamber Serial #: [id number] Treatment Protocol: 2.0 ATA with 90 minutes oxygen, and no air breaks Treatment Details Compression Rate Down: 1.5 psi / minute De-Compression Rate Up: 2.0 psi / minute Air breaks and breathing Decompress Decompress Compress Tx Pressure Begins Reached periods Begins Ends (leave unused spaces blank) Chamber Pressure (ATA 1 2 ------2 1 ) Clock Time (24 hr) 08:07 08:21 - - - - - - 09:51 09:59 Treatment Length: 112 (minutes) Treatment Segments: 4 Vital Signs Capillary Blood Glucose Reference Range: 80 - 120 mg / dl HBO Diabetic Blood Glucose Intervention Range: <131 mg/dl or >249 mg/dl Type: Time Vitals Blood Respiratory Capillary Blood Glucose Pulse Action Pulse: Temperature: Taken: Pressure: Rate: Glucose (mg/dl): Meter #: Oximetry (%) Taken: Pre 08:06 117/83 70 18 97.1 none per protocol Post 10:06 124/88 73 18 97.7 Treatment Response Treatment Toleration: Well Treatment Completion Status: Treatment Completed without Adverse Event Treatment Notes Patient arrived, prepared for treatment. Vital signs were within normal range. After performing a  safety check, patient was placed in the chamber which was compressed with 100% oxygen at a rate of 2 psi/min after ensuring normal ear equalization. Mrs. Briscoe tolerated treatment and subsequent decompression of the chamber at 2 psi/min. She was stable upon discharge with no complaint of ear equalization problems and/or pain. Physician HBO Attestation: I certify that I supervised this HBO treatment in accordance with Medicare guidelines. A trained emergency response team is readily available per Yes hospital policies and procedures. Continue HBOT as ordered. Yes Electronic Signature(s) Signed: 09/25/2022 9:45:44 AM By: [name] MD FACS Previous Signature: 09/25/2022 8:52:56 AM Version By: Donavan Burnet CHT EMT BS , , Entered By: [name] on 09/25/2022 09:45:44 HBO Safety Checklist Details -------------------------------------------------------------------------------- [name] ([id number] Page 2 of 2 Patient Name: Date of Service: [name], [name] 09/24/2022 8:00 A M Medical Record Number: [id number] Patient Account Number: [id number] Date of Birth/Sex: Treating RN: [birth year] (52 y.o. F) Zochol, [place] Primary Care Jarrin Staley: [name] Other Clinician: Donavan Burnet Referring Telesforo Brosnahan: Treating Yoland Scherr/Extender: Shea Stakes in Treatment: 21 HBO Safety Checklist Items Safety Checklist Consent Form Signed Patient voided / foley secured and emptied When did you last eato last evening Last dose of injectable or oral agent n/a Ostomy pouch emptied and vented if applicable NA All implantable devices assessed, documented and approved NA Intravenous access site secured and place NA Valuables secured Linens and cotton and cotton/polyester blend (less than 51% polyester) Personal oil-based products / skin lotions / body lotions removed Wigs or hairpieces removed NA Smoking or tobacco  materials removed NA Books / newspapers / magazines / loose paper removed Cologne, aftershave, perfume and deodorant removed Jewelry removed (may wrap wedding band) Make-up removed Hair care products removed Battery operated devices (external) removed Heating patches and chemical warmers removed Titanium eyewear removed Nail polish cured  greater than 10 hours greater than 10 hours Casting material cured greater than 10 hours NA Hearing aids removed NA Loose dentures or partials removed NA Prosthetics have been removed NA Patient demonstrates correct use of air break device (if applicable) Patient concerns have been addressed Patient grounding bracelet on and cord attached to chamber Specifics for Inpatients (complete in addition to above) Medication sheet sent with patient NA Intravenous medications needed or due during therapy sent with patient NA Drainage tubes (e.g. nasogastric tube or chest tube secured and vented) NA Endotracheal or Tracheotomy tube secured NA Cuff deflated of air and inflated with saline NA Airway suctioned NA Notes Paper version used prior to treatment. Electronic Signature(s) Signed: 09/25/2022 8:52:56 AM By: Donavan Burnet CHT EMT BS , , Entered By: Donavan Burnet on 09/24/2022 12:52:27

## 2022-09-26 NOTE — Progress Notes (Signed)
AVALINA, LIECHTY ([id number]) [phone].pdf Page 1 of 2 Visit Report for 09/24/2022 Arrival Information Details Patient Name: Date of Service: [name], [name] 09/24/2022 8:00 A M Medical Record Number: [id number] Patient Account Number: [id number] Date of Birth/Sex: Treating RN: 01-08-1971 (52 y.o. F) Zochol, [place] Primary Care Leisha Trinkle: [name] Other Clinician: Donavan Burnet Referring Marriana Hibberd: Treating Petar Mucci/Extender: Shea Stakes in Treatment: 21 Visit Information History Since Last Visit All ordered tests and consults were completed: Yes Patient Arrived: Ambulatory Added or deleted any medications: No Arrival Time: 07:52 Any new allergies or adverse reactions: No Accompanied By: self Had a fall or experienced change in No Transfer Assistance: None activities of daily living that may affect Patient Identification Verified: Yes risk of falls: Secondary Verification Process Completed: Yes Signs or symptoms of abuse/neglect since last visito No Patient Requires Transmission-Based Precautions: No Hospitalized since last visit: No Patient Has Alerts: No Implantable device outside of the clinic excluding No cellular tissue based products placed in the center since last visit: Pain Present Now: No Electronic Signature(s) Signed: 09/25/2022 8:52:56 AM By: Donavan Burnet CHT EMT BS , , Entered By: Donavan Burnet on 09/24/2022 12:46:31 -------------------------------------------------------------------------------- Encounter Discharge Information Details Patient Name: Date of Service: [name], [name] 09/24/2022 8:00 A M Medical Record Number: [id number] Patient Account Number: [id number] Date of Birth/Sex: Treating RN: March 08, 1971 (52 y.o. [name] Primary Care Geselle Cardosa: [name] Other Clinician: Donavan Burnet Referring Leiann Sporer: Treating Jerni Selmer/Extender: Shea Stakes in Treatment: 84 Encounter Discharge Information Items Discharge Condition: Stable Ambulatory Status: Ambulatory Discharge Destination: Home Transportation: Private Auto Accompanied By: self Schedule Follow-up Appointment: No Clinical Summary of Care: Electronic Signature(s) Signed: 09/25/2022 8:52:56 AM By: Donavan Burnet CHT EMT BS , , Entered By: Donavan Burnet on 09/24/2022 13:00:07 -------------------------------------------------------------------------------- Vitals Details Patient Name: Date of Service: [name], [place] W. 09/24/2022 8:00 A M Medical Record Number: [id number] Patient Account Number: [id number] Date of Birth/Sex: Treating RN: [birth year] (52 y.o. [name], [place] Primary Care Tytianna Greenley: [name] Other Clinician: Donavan Burnet Referring Sebastyan Snodgrass: Treating Margueritte Guthridge/Extender: [name], [name] Weeks in Treatment: 21 Vital Signs Time Taken: 08:06 Temperature (F): 97.1 [name], [name] ([id number]) [id number] Page 2 of 2 Height (in): 63 Pulse (bpm): 70 Weight (lbs): 170 Respiratory Rate (breaths/min): 18 Body Mass Index (BMI): 30.1 Blood Pressure (mmHg): 117/83 Reference Range: 80 - 120 mg / dl Electronic Signature(s) Signed: 09/25/2022 8:52:56 AM By: Donavan Burnet CHT EMT BS , , Entered By: Donavan Burnet on 09/24/2022 12:46:54

## 2022-09-26 NOTE — Progress Notes (Signed)
SENECA, MOBBS ([id number]) [id number] Page 1 of 2 Visit Report for 09/25/2022 Arrival Information Details Patient Name: Date of Service: [name], [name] 09/25/2022 8:00 A M Medical Record Number: [id number] Patient Account Number: [id number] Date of Birth/Sex: Treating RN: 26-Aug-1970 (52 y.o. [name] Primary Care Daphney Hopke: [name] Other Clinician: Donavan Burnet Referring Shawna Kiener: Treating Aniza Shor/Extender: Shea Stakes in Treatment: 65 Visit Information History Since Last Visit All ordered tests and consults were completed: Yes Patient Arrived: Ambulatory Added or deleted any medications: No Arrival Time: 07:47 Any new allergies or adverse reactions: No Accompanied By: self Had a fall or experienced change in No Transfer Assistance: None activities of daily living that may affect Patient Identification Verified: Yes risk of falls: Secondary Verification Process Completed: Yes Signs or symptoms of abuse/neglect since last visito No Patient Requires Transmission-Based Precautions: No Hospitalized since last visit: No Patient Has Alerts: No Implantable device outside of the clinic excluding No cellular tissue based products placed in the center since last visit: Pain Present Now: No Electronic Signature(s) Signed: 09/25/2022 8:30:02 AM By: Donavan Burnet CHT EMT BS , , Entered By: Donavan Burnet on 09/25/2022 08:30:02 -------------------------------------------------------------------------------- Encounter Discharge Information Details Patient Name: Date of Service: [name] [name], [name] 09/25/2022 8:00 A M Medical Record Number: [id number] Patient Account Number: [id number] Date of Birth/Sex: Treating RN: 16-Jan-1971 (52 y.o. [name] Primary Care [name]: [name] Other Clinician: Donavan Burnet Referring [name]: Treating Nasean Zapf/Extender: Shea Stakes in Treatment: 22 Encounter Discharge Information Items Discharge Condition: Stable Ambulatory Status: Ambulatory Discharge Destination: Other (Note Required) Transportation: Other Accompanied By: staff Schedule Follow-up Appointment: No Clinical Summary of Care: Notes Mrs. Sklenar has wound care appointment after treatment. Electronic Signature(s) Signed: 09/25/2022 2:48:16 PM By: Donavan Burnet CHT EMT BS , , Entered By: Donavan Burnet on 09/25/2022 14:48:15 -------------------------------------------------------------------------------- Vitals Details Patient Name: Date of Service: [name] [name], [place] W. 09/25/2022 8:00 A M Medical Record Number: [id number] Patient Account Number: [id number] Date of Birth/Sex: Treating RN: 12/15/1970 (52 y.o. [name] Primary Care Jaydalyn Demattia: [name] Other Clinician: Donavan Burnet Referring Eean Buss: Treating [name]/Extender: Shea Stakes in Treatment: [address] [name] ([id number]) [id number] Page 2 of 2 Vital Signs Time Taken: 07:56 Temperature (F): 96.8 Height (in): 63 Pulse (bpm): 77 Weight (lbs): 170 Respiratory Rate (breaths/min): 20 Body Mass Index (BMI): 30.1 Blood Pressure (mmHg): 136/86 Reference Range: 80 - 120 mg / dl Electronic Signature(s) Signed: 09/25/2022 8:30:20 AM By: Donavan Burnet CHT EMT BS , , Entered By: Donavan Burnet on 09/25/2022 08:30:20

## 2022-09-28 ENCOUNTER — Ambulatory Visit: Payer: 59 | Attending: General Surgery

## 2022-09-28 ENCOUNTER — Other Ambulatory Visit (HOSPITAL_COMMUNITY): Payer: Self-pay

## 2022-09-28 ENCOUNTER — Encounter (HOSPITAL_BASED_OUTPATIENT_CLINIC_OR_DEPARTMENT_OTHER): Payer: 59 | Admitting: General Surgery

## 2022-09-28 VITALS — Wt 173.0 lb

## 2022-09-28 DIAGNOSIS — Z483 Aftercare following surgery for neoplasm: Secondary | ICD-10-CM | POA: Insufficient documentation

## 2022-09-28 NOTE — Therapy (Signed)
OUTPATIENT PHYSICAL THERAPY SOZO SCREENING NOTE   Patient Name: [name] MRN: [id number] DOB:1970-11-13, 52 y.o., female Today's Date: 09/28/2022  PCP: Mckinley Jewel, MD REFERRING PROVIDER: Stark Klein, MD   PT End of Session - 09/28/22 1037     Visit Number 12   # unchanged due to screen only   PT Start Time 1034    PT Stop Time 1039    PT Time Calculation (min) 5 min    Activity Tolerance Patient tolerated treatment well    Behavior During Therapy [facility] for tasks assessed/performed             Past Medical History:  Diagnosis Date   Asthma    followed by pcp---  pt stated mild seasonal asthma   Cancer ([place]) 10/2021   left breast DCIS   Female cystocele    symptomatic   GAD (generalized anxiety disorder)    GERD (gastroesophageal reflux disease)    Hiatal hernia    History of 2019 novel coronavirus disease (COVID-19) 07/2020   per pt had mild symptoms that resolved   MDD (major depressive disorder)    Mixed incontinence urge and stress    Wears glasses    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[place])    [facility]---  dx 2015,  takes methotrexate wkly, pt stated she is stable   Past Surgical History:  Procedure Laterality Date   ANTERIOR AND POSTERIOR REPAIR N/A 01/14/2021   Procedure: ANTERIOR (CYSTOCELE) AND POSTERIOR REPAIR (RECTOCELE);  Surgeon: Cheri Fowler, MD;  Location: [name];  Service: Gynecology;  Laterality: N/A;   AUGMENTATION MAMMAPLASTY Bilateral 2010   saline   BREAST BIOPSY Left 10/23/2021   Stereo Bx, X-clip, Ductal carcinoma in situ   BREAST CYST EXCISION Left 03/25/2022   Procedure: EXCISION OF CHRONIC LEFT BREAST WOUND;  Surgeon: Stark Klein, MD;  Location: [place];  Service: General;  Laterality: Left;   BREAST ENHANCEMENT SURGERY  2009   BREAST LUMPECTOMY     BREAST LUMPECTOMY WITH RADIOACTIVE SEED AND SENTINEL LYMPH NODE BIOPSY Left 11/12/2021    Procedure: LEFT BREAST LUMPECTOMY WITH RADIOACTIVE SEED AND SENTINEL LYMPH NODE BIOPSY;  Surgeon: Stark Klein, MD;  Location: [place];  Service: General;  Laterality: Left;   CARPAL TUNNEL RELEASE Right 2011   LAPAROSCOPIC SUPRACERVICAL HYSTERECTOMY  02/21/2010   @ [place] by Dr Willis [place]   NASAL SEPTUM SURGERY  01/2018   insertion button prosthesis for septal perforation   RHINOPLASTY  2005   nasal fracture   Patient Active Problem List   Diagnosis Date Noted   Abscess    Cellulitis of left breast 04/03/2022   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  09/13/2020   Cervix prolapsed into vagina 09/13/2020   Dysuria 09/13/2020   Genetic testing 09/10/2020   Anxiety 06/18/2020   Mild intermittent asthma without complication 123456   Bloody diarrhea 06/09/2016   Major depressive disorder, recurrent, severe without psychotic features ([place])    Severe recurrent major depression w/psychotic features, mood-congruent ([place]) 05/04/2014   Major depression 05/04/2014   Positive ANA (antinuclear antibody) 04/10/2014   Granulomatosis with polyangiitis with pulmonary involvement ([place]) 02/07/2014   Generalized anxiety disorder 02/07/2014   Insomnia 02/07/2014   Sensorineural hearing loss of right ear 12/06/2013   Dermatitis 12/05/2013   Nasal septal perforation 12/05/2013  Allergic rhinitis 10/12/2006   Asthma 10/12/2006   GERD 10/12/2006    REFERRING DIAG: left breast cancer at risk for lymphedema  THERAPY DIAG: Aftercare following surgery for neoplasm  PERTINENT HISTORY: Patient was diagnosed on 10/23/21 with left DCIS (grade to be determined later). It measures 1.43mm and is located in the upper outer quadrant. It is ER/PR +, HER2 -. Underwent a L lumpectomy and SLNB (0/3) on 11/12/21, Radiation to be completed 01/29/2022   PRECAUTIONS: left UE  Lymphedema risk, None  SUBJECTIVE: Pt returns for her 3 month L-Dex screen.   PAIN:  Are you having pain? No  SOZO SCREENING: Patient was assessed today using the SOZO machine to determine the lymphedema index score. This was compared to her baseline score. It was determined that she is within the recommended range when compared to her baseline and no further action is needed at this time. She will continue SOZO screenings. These are done every 3 months for 2 years post operatively followed by every 6 months for 2 years, and then annually.   L-DEX FLOWSHEETS - 09/28/22 1000       L-DEX LYMPHEDEMA SCREENING   Measurement Type Unilateral    L-DEX MEASUREMENT EXTREMITY Upper Extremity    POSITION  Standing    DOMINANT SIDE Right    At Risk Side Left    BASELINE SCORE (UNILATERAL) 5.1    L-DEX SCORE (UNILATERAL) -1    VALUE CHANGE (UNILAT) -6.1              Otelia Limes, PTA 09/28/2022, 10:38 AM

## 2022-09-29 ENCOUNTER — Encounter (HOSPITAL_BASED_OUTPATIENT_CLINIC_OR_DEPARTMENT_OTHER): Payer: 59 | Admitting: General Surgery

## 2022-09-30 ENCOUNTER — Encounter (HOSPITAL_BASED_OUTPATIENT_CLINIC_OR_DEPARTMENT_OTHER): Payer: 59 | Admitting: General Surgery

## 2022-10-01 ENCOUNTER — Encounter (HOSPITAL_BASED_OUTPATIENT_CLINIC_OR_DEPARTMENT_OTHER): Payer: 59 | Admitting: General Surgery

## 2022-10-05 ENCOUNTER — Encounter (HOSPITAL_BASED_OUTPATIENT_CLINIC_OR_DEPARTMENT_OTHER): Payer: 59 | Attending: General Surgery | Admitting: General Surgery

## 2022-10-05 DIAGNOSIS — T8131XA Disruption of external operation (surgical) wound, not elsewhere classified, initial encounter: Secondary | ICD-10-CM | POA: Diagnosis not present

## 2022-10-05 DIAGNOSIS — Y838 Other surgical procedures as the cause of abnormal reaction of the patient, or of later complication, without mention of misadventure at the time of the procedure: Secondary | ICD-10-CM | POA: Insufficient documentation

## 2022-10-05 DIAGNOSIS — D0512 Intraductal carcinoma in situ of left breast: Secondary | ICD-10-CM | POA: Diagnosis not present

## 2022-10-05 DIAGNOSIS — L59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: Secondary | ICD-10-CM | POA: Insufficient documentation

## 2022-10-05 NOTE — Progress Notes (Signed)
[name], [name] ([id number]) [id number] Page 1 of 7 Visit Report for 10/05/2022 Chief Complaint Document Details Patient Name: Date of Service: [name], [name] 10/05/2022 8:30 A M Medical Record Number: [id number] Patient Account Number: [id number] Date of Birth/Sex: Treating RN: Dec 01, 1970 (52 y.o. F) Primary Care Provider: [name] Other Clinician: Referring Provider: Treating Provider/Extender: [name], [name] Weeks in Treatment: 58 Information Obtained from: Patient Chief Complaint 04/24/2022; patient is here for a surgical wound on her left lateral breast Electronic Signature(s) Signed: 10/05/2022 8:59:33 AM By: [name] MD FACS Entered By: [name] on 10/05/2022 08:59:33 -------------------------------------------------------------------------------- HPI Details Patient Name: Date of Service: [name] [name], [place] W. 10/05/2022 8:30 A M Medical Record Number: [id number] Patient Account Number: [id number] Date of Birth/Sex: Treating RN: 08/07/1970 (52 y.o. F) Primary Care Provider: [name] Other Clinician: Referring Provider: Treating Provider/Extender: [name], [name] Weeks in Treatment: 2 History of Present Illness HPI Description: ADMISSION 05/25/2022 This is a 52 year old woman who is found to have an abnormal mammogram of her left breast earlier this year showing a 13 mm group of calcifications in the upper quadrant of the left breast. 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 She underwent a left lumpectomy on 11/12/2021. The patient states the wound never really healed and was open. She underwent radiation therapy from 12/10/2021 through 01/19/22 28 fractions of 1.8GY. Essentially the wound would not heal. She was treated several times with antibiotics finally on 03/25/2022 she was taken to the OR by Dr. Barry Dienes for  excision of the wound in a sinus. Operative cultures grew strep and Prevotella and a culture from the clinic [place] MRSA. It was recommended for 4 weeks of Augmentin and doxycycline by infectious disease. She was seen by wound care and she has been treating the wound with Medihoney ever since. 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 She was on meth methotrexate 20 mg once a week however this I think was put on hold because of the wound followed by Dr. Posey Pronto of rheumatology. She has asthma. Most recent breast ultrasound was on 9/7 She has a large nonhealing wound on the lateral aspect of her left breast 04/30/2022: Although I do not have a photograph to compare to last week, the RN report is that it is substantially cleaner. She still has ample slough and nonviable fat and subcutaneous tissue present. She only was able to get her Santyl on Monday so she has just had a couple of days of treatment. 05/08/2022: The wound dimensions are about the same, but the surface is substantially cleaner. There is just a little bit of slough accumulation on the surface. 05/18/2022: The wound is a little bit smaller, but not much. It is quite a bit cleaner, however. 05/25/2022: The wound surface continues to improve. Very little slough accumulation this week. No substantial change in the wound dimensions. 06/01/2022: The wound surface continues to have a layer of slough on it. There is a crack in the surface near the 12 o'clock position that when further explored, revealed a cavity and tunnel that is about 2 cm deep. No purulent drainage or concern for infection. 12/11; left breast wound. The wound itself is somewhat smaller in size  per our measurements. However she has a divot  at 1:00 that measures about a 1.5 cm deep. We have been using iodoform packing and if it and Santyl to the rest of the wound This wound was initially had a left lumpectomy on 11/12/2021 she underwent 28 fractions of radiation. She potentially could benefit from hyperbaric oxygen 12/18; left breast wound. Better looking wound surface and improvement in measurements of the small tunnel. We have been using Santyl and iodoform. 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 Underneath the layer of slough, the tissue is much healthier-looking, with better color and some granulation tissue beginning to form. 07/13/2022: The wound continues to fill with better looking granulation tissue. There is very little slough accumulation and the cavity is shallower. We are going to submit for insurance approval for hyperbaric oxygen therapy, as it has now been 6 months since the time of her initial wound and radiation therapy. OAKLEI, KLUTZ ([id number]) [id number] Page 2 of 7 07/20/2022: The cavity continues to fill in and the surface of the wound is improving. EKG and chest x-ray are complete and we are just awaiting insurance approval for hyperbarics. 07/27/2022: The cavity has filled in considerably and the surface of the wound is very clean with just a thin layer of light slough. We are still awaiting insurance approval for hyperbaric oxygen therapy. 08/03/2022: She was approved for hyperbaric oxygen therapy and received 2 treatments last week. It is rather remarkable the improvement already in her wound. The cavity continues to fill and there is epithelialization occurring around the edges of her wound. Minimal slough accumulation. 08/13/2022: Her wound is smaller and is  epithelializing. There is no longer a tunnel or tract to pack. There is just slight slough accumulation on the surface. 08/19/2022: Her wound continues to contract and epithelialize. Light slough and eschar on the remaining open areas. 08/26/2022: Her wound is smaller by about half this week. There is still some slough accumulation on the surface. 09/02/2022: There has been further epithelialization of her wound. It is nearly completely flush with the surrounding skin. Minimal slough accumulation. 09/09/2022: The wound is smaller again this week. There has been circumferential epithelialization. There is slough on the remaining wound surface. 09/25/2022: Her wound is down to just a very small superficial opening of a couple of millimeters. 10/05/2022: Her wound is healed. Electronic Signature(s) Signed: 10/05/2022 8:59:55 AM By: [name] MD FACS Entered By: [name] on 10/05/2022 08:59:55 -------------------------------------------------------------------------------- Physical Exam Details Patient Name: Date of Service: [name] 10/05/2022 8:30 A M Medical Record Number: [id number] Patient Account Number: [id number] Date of Birth/Sex: Treating RN: 29-Oct-1970 (52 y.o. F) Primary Care Provider: [name] Other Clinician: Referring Provider: Treating Provider/Extender: [name], [name] Weeks in Treatment: 23 Constitutional . . . . no acute distress. Respiratory Normal work of breathing on room air. Notes 10/05/2022: Her wound is healed. Electronic Signature(s) Signed: 10/05/2022 9:09:25 AM By: [name] MD FACS Entered By: [name] on 10/05/2022 09:09:24 -------------------------------------------------------------------------------- Physician Orders Details Patient Name: Date of Service: [name] [name], [place] W. 10/05/2022 8:30 A M Medical Record Number: [id number] Patient Account Number: [id number] Date of Birth/Sex: Treating RN: [birth year] (52  y.o. [name] Provider: [name] Other Clinician: Referring Provider: Treating Provider/Extender:  [name] Pahwani, [name] Weeks in Treatment: 23 Verbal / Phone Orders: No Diagnosis Coding ICD-10 Coding Code Description S21.002D Unspecified open wound of left breast, subsequent encounter T81.31XD Disruption of external operation (surgical) wound, not elsewhere classified, subsequent encounter 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 ANAYELLI, SHURTZ ([id number]) [id number] Page 3 of 7 Discharge From [facility] Discharge from [place]!!!!!!!! Anesthetic (In clinic) Topical Lidocaine 4% applied to wound bed Patient Medications llergies: kiwi, Ativan, erythromycin base, Xanax, nitrofurantoin A Notifications Medication Indication Start End 10/05/2022 lidocaine DOSE topical 4 % cream - cream topical Electronic Signature(s) Signed: 10/05/2022 9:09:33 AM By: [name] MD FACS Entered By: [name] on 10/05/2022 09:09:33 -------------------------------------------------------------------------------- Problem List Details Patient Name: Date of Service: [name] [name], [place] W. 10/05/2022 8:30 A M Medical Record Number: [id number] Patient Account Number: [id number] Date of Birth/Sex: Treating RN: [birth year] (52 y.o. F) Primary Care Provider: [name] Other Clinician: Referring Provider: Treating Provider/Extender: [name], [name] Weeks in Treatment: 23 Active Problems ICD-10 Encounter Code Description Active Date MDM Diagnosis S21.002D Unspecified open wound of left breast, subsequent encounter 04/24/2022 No Yes T81.31XD Disruption of external operation (surgical) wound, not elsewhere classified, 04/24/2022 No Yes subsequent encounter L59.8 Other specified  disorders of the skin and subcutaneous tissue related to 04/24/2022 No Yes radiation 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 04/24/2022 No Yes Inactive Problems Resolved Problems Electronic Signature(s) Signed: 10/05/2022 8:59:23 AM By: [name] MD FACS Entered By: [name] on 10/05/2022 08:59:23 -------------------------------------------------------------------------------- Progress Note Details Patient Name: Date of Service: [name] [name], [place] W. 10/05/2022 8:30 A M Medical Record Number: [id number] Patient Account Number: [id number] Date of Birth/Sex: Treating RN: 1970/10/09 (52 y.o. F) Primary Care Provider: [name] Other Clinician: Referring Provider: Treating Provider/Extender: Deloris, Bierl ([id number]) [id number] Page 4 of 7 Weeks in Treatment: 23 Subjective Chief Complaint Information obtained from Patient 04/24/2022; patient is here for a surgical wound on her left lateral breast History of Present Illness (HPI) ADMISSION 05/25/2022 This is a 52 year old woman who is found to have an abnormal mammogram of her left breast earlier this year showing a 13 mm group of calcifications in the upper quadrant of the left breast. 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 She underwent a left lumpectomy on 11/12/2021. The patient states the wound never really healed and was open. She underwent radiation therapy from 12/10/2021 through 01/19/22 28 fractions of 1.8GY. Essentially the wound would not heal. She was treated several times with antibiotics finally on 03/25/2022 she was taken to the OR by Dr. Barry Dienes for excision of the wound in a sinus. Operative cultures grew strep and Prevotella and a culture from the clinic [place] MRSA. It was recommended for 4 weeks of Augmentin and doxycycline by infectious disease. She was  seen by wound care and she has been treating the wound with Medihoney ever since. 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 She was on meth methotrexate 20 mg once a week however this I think was put on hold because of the wound followed by Dr. Posey Pronto of rheumatology. She has  asthma. Most recent breast ultrasound was on 9/7 She has a large nonhealing wound on the lateral aspect of her left breast 04/30/2022: Although I do not have a photograph to compare to last week, the RN report is that it is substantially cleaner. She still has ample slough and nonviable fat and subcutaneous tissue present. She only was able to get her Santyl on Monday so she has just had a couple of days of treatment. 05/08/2022: The wound dimensions are about the same, but the surface is substantially cleaner. There is just a little bit of slough accumulation on the surface. 05/18/2022: The wound is a little bit smaller, but not much. It is quite a bit cleaner, however. 05/25/2022: The wound surface continues to improve. Very little slough accumulation this week. No substantial change in the wound dimensions. 06/01/2022: The wound surface continues to have a layer of slough on it. There is a crack in the surface near the 12 o'clock position that when further explored, revealed a cavity and tunnel that is about 2 cm deep. No purulent drainage or concern for infection. 12/11; left breast wound. The wound itself is somewhat smaller in size per our measurements. However she has a divot at 1:00 that measures about a 1.5 cm deep. We have been using iodoform packing and if it and Santyl to the rest of the wound This wound was initially had a left lumpectomy on 11/12/2021 she underwent 28 fractions  of radiation. She potentially could benefit from hyperbaric oxygen 12/18; left breast wound. Better looking wound surface and improvement in measurements of the small tunnel. We have been using Santyl and iodoform. 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 Underneath the layer of slough, the tissue is much healthier-looking, with better color and some granulation tissue beginning to form. 07/13/2022: The wound continues to fill with better looking granulation tissue. There is very little slough accumulation and the cavity is shallower. We are going to submit for insurance approval for hyperbaric oxygen therapy, as it has now been 6 months since the time of her initial wound and radiation therapy. 07/20/2022: The cavity continues to fill in and the surface of the wound is improving. EKG and chest x-ray are complete and we are just awaiting insurance approval for hyperbarics. 07/27/2022: The cavity has filled in considerably and the surface of the wound is very clean with just a thin layer of light slough. We are still awaiting insurance approval for hyperbaric oxygen therapy. 08/03/2022: She was approved for hyperbaric oxygen therapy and received 2 treatments last week. It is rather remarkable the improvement already in her wound. The cavity continues to fill and there is epithelialization occurring around the edges of her wound. Minimal slough accumulation. 08/13/2022: Her wound is smaller and is epithelializing. There is no longer a tunnel or tract to pack. There is just slight slough accumulation on the surface. 08/19/2022: Her wound continues to contract and epithelialize. Light slough and eschar on the remaining open areas. 08/26/2022: Her wound is smaller by about half this week. There is still some  slough accumulation on the surface. 09/02/2022: There has been further epithelialization of her wound. It is nearly completely flush with the surrounding skin. Minimal slough accumulation. 09/09/2022: The wound is smaller again this week. There has been circumferential epithelialization.  There is slough on the remaining wound surface. 09/25/2022: Her wound is down to just a very small superficial opening of a couple of millimeters. 10/05/2022: Her wound is healed. Patient History Information obtained from Patient. Social History Never smoker, Marital Status - Married, Alcohol Use - Moderate, Drug Use - No History, Caffeine Use - Daily. Medical History Hematologic/Lymphatic Patient has history of Lymphedema - Left breast-gets Lymphadema tx Hospitalization/Surgery History - 03/25/22 Left Breast cyst excision; 11/12/21 Left Breast Lumpectomy with Radioactive seeds;Left breat Biopsy;Anterior. 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 12/08/21- 01/29/22- 37 rounds of Radiation Psychiatric General Anxiety Objective Constitutional no acute distress. Vitals Time Taken: 8:34 AM, Height: 63 in, Weight: 170 lbs, BMI: 30.1, Temperature: 98.0 F, Pulse: 75 bpm, Respiratory Rate: 20 breaths/min, Blood Pressure: 126/74 mmHg. Respiratory Normal work of breathing on room air. General Notes: 10/05/2022: Her wound is healed. Integumentary (Hair, Skin) Wound #1 status is Healed - Epithelialized. Original cause of wound was Surgical Injury. The date acquired was: 04/03/2022. The wound has been in treatment 23 weeks. The wound is located on the Left Breast. The wound measures 0cm length x 0cm width x 0cm depth; 0cm^2 area and 0cm^3 volume. There is  no tunneling or undermining noted. There is a none present amount of drainage noted. The wound margin is distinct with the outline attached to the wound base. There is no granulation within the wound bed. There is no necrotic tissue within the wound bed. The periwound skin appearance had no abnormalities noted for moisture. The periwound skin appearance had no abnormalities noted for color. The periwound skin appearance exhibited: Induration. Periwound temperature was noted as No Abnormality. Assessment Active Problems ICD-10 Unspecified open wound of left breast, subsequent encounter Disruption of external operation (surgical) wound, not elsewhere classified, subsequent encounter Other specified disorders of the skin and subcutaneous tissue related to radiation Intraductal carcinoma in situ of left breast Plan Discharge From [facility]: Discharge from [place]!!!!!!!! Anesthetic: (In clinic) Topical Lidocaine 4% applied to wound bed The following medication(s) was prescribed: lidocaine topical 4 % cream cream topical was prescribed at facility 10/05/2022: Her wound is healed. The skin is a little bit dry and I recommended that she keep it well moisturized. We will discharge her from the wound care center. She may follow-up as needed. Electronic Signature(s) Signed: 10/05/2022 9:09:55 AM By: [name] MD FACS Entered By: [name] on 10/05/2022 09:09:54 [name] ([id number]) [id number] Page 6 of 7 -------------------------------------------------------------------------------- HxROS Details Patient Name: Date of Service: [name], [name] 10/05/2022 8:30 A M Medical Record Number: [id number] Patient Account Number: [id number] Date of Birth/Sex: Treating RN: 03-21-1971 (52 y.o. F) Primary Care Provider: [name] Other Clinician: Referring Provider: Treating Provider/Extender: [name],  [name] Weeks in Treatment: 60 Information Obtained From Patient Ear/Nose/Mouth/Throat Medical History: Past Medical History Notes: Left ear limited hearing Hematologic/Lymphatic Medical History: Positive for: Lymphedema - Left breast-gets Lymphadema tx Gastrointestinal Medical History: Past Medical History Notes: GERD Immunological Medical History: Past Medical History Notes: Wegener"s Granulomatosis Musculoskeletal Medical History: Past Medical History Notes: "RA like arthritis" as per patient Oncologic Medical History: Past Medical History Notes: Left Breast. 12/08/21- 01/29/22- 37 rounds of Radiation Psychiatric Medical History: Past Medical History Notes: General Anxiety Immunizations Pneumococcal Vaccine: Received Pneumococcal Vaccination: Yes Received Pneumococcal Vaccination On or  After 60th Birthday: No Implantable Devices Yes Hospitalization / Surgery History Type of Hospitalization/Surgery 03/25/22 Left Breast cyst excision; 11/12/21 Left Breast Lumpectomy with Radioactive seeds;Left breat Biopsy;Anterior Family and Social History Never smoker; Marital Status - Married; Alcohol Use: Moderate; Drug Use: No History; Caffeine Use: Daily; Financial Concerns: No; Food, Clothing or Shelter Needs: No; Support System Lacking: No; Transportation Concerns: No Electronic Signature(s) Signed: 10/05/2022 9:35:45 AM By: [name] MD FACS Entered By: [name] on 10/05/2022 08:59:59 [name] ([id number]) [id number] Page 7 of 7 -------------------------------------------------------------------------------- SuperBill Details Patient Name: Date of Service: [name] [name] 10/05/2022 Medical Record Number: [id number] Patient Account Number: [id number] Date of Birth/Sex: Treating RN: 05-Dec-1970 (52 y.o. [name] Provider: [name] Other Clinician: Referring Provider: Treating Provider/Extender:  [name], [name] Weeks in Treatment: 23 Diagnosis Coding ICD-10 Codes Code Description S21.002D Unspecified open wound of left breast, subsequent encounter T81.31XD Disruption of external operation (surgical) wound, not elsewhere classified, subsequent encounter 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 Facility Procedures : CPT4 Code: [id number] Description: [id number] - WOUND CARE VISIT-LEV 2 EST PT Modifier: Quantity: 1 Physician Procedures : CPT4 Code Description Modifier [id number] - WC PHYS LEVEL 2 - EST PT ICD-10 Diagnosis Description S21.002D Unspecified open wound of left breast, subsequent encounter T81.31XD Disruption of external operation (surgical) wound, not elsewhere  classified, subsequent encounter 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 Quantity: 1 Electronic Signature(s) Signed: 10/05/2022 9:10:09 AM By: [name] MD FACS Entered By: [name] on 10/05/2022 09:10:08

## 2022-10-06 NOTE — Progress Notes (Signed)
[name] [name], [name] [name] ([id number]) [id number] Page 1 of 6 Visit Report for 10/05/2022 Arrival Information Details Patient Name: Date of Service: [name] [name] Mile. 10/05/2022 8:30 A M Medical Record Number: [id number] Patient Account Number: [id number] Date of Birth/Sex: Treating RN: 08-24-1970 (52 y.o. F) Primary Care Cj Beecher: [name] Other Clinician: Referring Hortense Cantrall: Treating Carey Lafon/Extender: [name], [name] Weeks in Treatment: 55 Visit Information History Since Last Visit All ordered tests and consults were completed: No Patient Arrived: Ambulatory Added or deleted any medications: No Arrival Time: 08:33 Any new allergies or adverse reactions: No Accompanied By: husband Had a fall or experienced change in No Transfer Assistance: None activities of daily living that may affect Patient Identification Verified: Yes risk of falls: Secondary Verification Process Completed: Yes Signs or symptoms of abuse/neglect since last visito No Patient Requires Transmission-Based Precautions: No Hospitalized since last visit: No Patient Has Alerts: No Implantable device outside of the clinic excluding No cellular tissue based products placed in the center since last visit: Pain Present Now: No Electronic Signature(s) Signed: 10/05/2022 11:26:19 AM By: Worthy Rancher Entered By: Worthy Rancher on 10/05/2022 08:34:18 -------------------------------------------------------------------------------- Clinic Level of Care Assessment Details Patient Name: Date of Service: [name] [name] [name], [name] Mile. 10/05/2022 8:30 A M Medical Record Number: [id number] Patient Account Number: [id number] Date of Birth/Sex: Treating RN: 1971-02-27 (52 y.o. [name] [place] Welcome: [name] Other Clinician: Referring Marlee Trentman: Treating Maddy Graham/Extender: Shea Stakes in Treatment: 23 Clinic Level of Care Assessment Items TOOL 4  Quantity Score []  - 0 Use when only an EandM is performed on FOLLOW-UP visit ASSESSMENTS - Nursing Assessment / Reassessment []  - 0 Reassessment of Co-morbidities (includes updates in patient status) []  - 0 Reassessment of Adherence to Treatment Plan ASSESSMENTS - Wound and Skin A ssessment / Reassessment X - Simple Wound Assessment / Reassessment - one wound 1 5 []  - 0 Complex Wound Assessment / Reassessment - multiple wounds []  - 0 Dermatologic / Skin Assessment (not related to wound area) ASSESSMENTS - Focused Assessment []  - 0 Circumferential Edema Measurements - multi extremities []  - 0 Nutritional Assessment / Counseling / Intervention []  - 0 Lower Extremity Assessment (monofilament, tuning fork, pulses) []  - 0 Peripheral Arterial Disease Assessment (using hand held doppler) ASSESSMENTS - Ostomy and/or Continence Assessment and Care []  - 0 Incontinence Assessment and Management []  - 0 Ostomy Care Assessment and Management (repouching, etc.) PROCESS - Coordination of Care X - Simple Patient / Family Education for ongoing care 1 15 [name] [name] [name], [name] [name] [name] ([id number]) [phone].pdf Page 2 of 6 []  - 0 Complex (extensive) Patient / Family Education for ongoing care X- 1 10 Staff obtains Programmer, systems, Records, T Results / Process Orders est []  - 0 Staff telephones HHA, Nursing Homes / Clarify orders / etc []  - 0 Routine Transfer to another Facility (non-emergent condition) []  - 0 Routine Hospital Admission (non-emergent condition) []  - 0 New Admissions / Biomedical engineer / Ordering NPWT Apligraf, etc. , []  - 0 Emergency Hospital Admission (emergent condition) X- 1 10 Simple Discharge Coordination []  - 0 Complex (extensive) Discharge Coordination PROCESS - Special Needs []  - 0 Pediatric / Minor Patient Management []  - 0 Isolation Patient Management []  - 0 Hearing / Language / Visual special needs []  - 0 Assessment of Community  assistance (transportation, D/C planning, etc.) []  - 0 Additional assistance / Altered mentation []  - 0 Support Surface(s) Assessment (bed, cushion, seat, etc.) INTERVENTIONS - Wound Cleansing / Measurement []  - 0 Simple  Wound Cleansing - one wound []  - 0 Complex Wound Cleansing - multiple wounds X- 1 5 Wound Imaging (photographs - any number of wounds) []  - 0 Wound Tracing (instead of photographs) []  - 0 Simple Wound Measurement - one wound []  - 0 Complex Wound Measurement - multiple wounds INTERVENTIONS - Wound Dressings []  - 0 Small Wound Dressing one or multiple wounds []  - 0 Medium Wound Dressing one or multiple wounds []  - 0 Large Wound Dressing one or multiple wounds X- 1 5 Application of Medications - topical []  - 0 Application of Medications - injection INTERVENTIONS - Miscellaneous []  - 0 External ear exam []  - 0 Specimen Collection (cultures, biopsies, blood, body fluids, etc.) []  - 0 Specimen(s) / Culture(s) sent or taken to Lab for analysis []  - 0 Patient Transfer (multiple staff / Civil Service fast streamer / Similar devices) []  - 0 Simple Staple / Suture removal (25 or less) []  - 0 Complex Staple / Suture removal (26 or more) []  - 0 Hypo / Hyperglycemic Management (close monitor of Blood Glucose) []  - 0 Ankle / Brachial Index (ABI) - do not check if billed separately X- 1 5 Vital Signs Has the patient been seen at the hospital within the last three years: Yes Total Score: 55 Level Of Care: New/Established - Level 2 Electronic Signature(s) Signed: 10/05/2022 4:49:56 PM By: [name] Entered By: [name] on 10/05/2022 09:03:34 [name] [name] [name] ([id number]) [id number] Page 3 of 6 -------------------------------------------------------------------------------- Encounter Discharge Information Details Patient Name: Date of Service: [name] [name] Mile. 10/05/2022 8:30 A M Medical Record Number: [id number] Patient Account  Number: [id number] Date of Birth/Sex: Treating RN: 28-May-1971 (52 y.o. [name] Jalon Blackwelder: [name] Other Clinician: Referring Ivanell Deshotel: Treating Aneira Cavitt/Extender: Shea Stakes in Treatment: 45 Encounter Discharge Information Items Discharge Condition: Stable Ambulatory Status: Ambulatory Discharge Destination: Home Transportation: Private Auto Accompanied By: husband Schedule Follow-up Appointment: Yes Clinical Summary of Care: Patient Declined Electronic Signature(s) Signed: 10/05/2022 4:49:56 PM By: [name] Entered By: [name] on 10/05/2022 09:04:00 -------------------------------------------------------------------------------- Lower Extremity Assessment Details Patient Name: Date of Service: Unk [name] 10/05/2022 8:30 A M Medical Record Number: [id number] Patient Account Number: [id number] Date of Birth/Sex: Treating RN: Sep 29, 1970 (52 y.o. [name] Arcadia Gorgas: [name] Other Clinician: Referring Rilda Bulls: Treating Tehran Rabenold/Extender: [name], [name] Weeks in Treatment: 23 Electronic Signature(s) Signed: 10/05/2022 4:49:56 PM By: Sabas Sous By: [name] on 10/05/2022 08:44:44 -------------------------------------------------------------------------------- Multi Wound Chart Details Patient Name: Date of Service: [name] [name] [name], [name] W. 10/05/2022 8:30 A M Medical Record Number: [id number] Patient Account Number: [id number] Date of Birth/Sex: Treating RN: 1970-12-17 (52 y.o. F) Primary Care Calix Heinbaugh: [name] Other Clinician: Referring Erikka Follmer: Treating Allina Riches/Extender: [name], [name] Weeks in Treatment: 23 Vital Signs Height(in): 63 Pulse(bpm): 75 Weight(lbs): 170 Blood Pressure(mmHg): 126/74 Body Mass Index(BMI): 30.1 Temperature(F): 98.0 Respiratory Rate(breaths/min): 20 [1:Photos:]  [N/A:N/A] [name] [name] [name], [name] [name] [name] ([id number]) [1:Left Breast Wound Location: Surgical Injury Wounding Event: Malignant Wound Primary Etiology: Lymphedema Comorbid History: 04/03/2022 Date Acquired: 23 Weeks of Treatment: Healed - Epithelialized Wound Status: No Wound Recurrence: 0x0x0 Measurements L  x W x D (cm) 0 A (cm) : rea 0 Volume (cm) : 100.00% % Reduction in A rea: 100.00% % Reduction in Volume: Full Thickness Without Exposed Classification: Support Structures None Present Exudate Amount: Distinct, outline attached Wound Margin: None  Present (0%) Granulation Amount: None Present (0%) Necrotic Amount: Fascia: No Exposed Structures: Fat Layer (  Subcutaneous Tissue): No Tendon: No Muscle: No Joint: No Bone: No Large (67-100%) Epithelialization: Induration: Yes Periwound Skin Texture: No  Abnormalities Noted Periwound Skin Moisture: No Abnormalities Noted Periwound Skin Color: No Abnormality Temperature:] [N/A:N/A N/A N/A N/A N/A N/A N/A N/A N/A N/A N/A N/A N/A N/A N/A N/A N/A N/A N/A N/A N/A N/A N/A N/A] Treatment Notes Electronic Signature(s) Signed: 10/05/2022 8:59:28 AM By: [name] MD FACS Entered By: [name] on 10/05/2022 08:59:28 -------------------------------------------------------------------------------- Multi-Disciplinary Care Plan Details Patient Name: Date of Service: [name] [name] [name], [name] [place] W. 10/05/2022 8:30 A M Medical Record Number: [id number] Patient Account Number: [id number] Date of Birth/Sex: Treating RN: Feb 04, 1971 (52 y.o. [name] Ezma Rehm: [name] Other Clinician: Referring Charnese Federici: Treating Aidee Latimore/Extender: Shea Stakes in Treatment: Michie reviewed with physician Active Inactive Electronic Signature(s) Signed: 10/05/2022 4:49:56 PM By: Sabas Sous By: [name] on 10/05/2022  09:02:55 -------------------------------------------------------------------------------- Pain Assessment Details Patient Name: Date of Service: [name] Mattes. 10/05/2022 8:30 A M Medical Record Number: [id number] Patient Account Number: [id number] Date of Birth/Sex: Treating RN: Sep 05, 1970 (52 y.o. F) Primary Care Arnaldo Heffron: [name] Other Clinician: Referring Avrey Flanagin: Treating Jen Eppinger/Extender: [name], [name] Weeks in Treatment: 23 Active Problems Location of Pain Severity and Description of Pain Patient Has Paino No Site Locations OAKLEY, LASEK ([id number]) [phone].pdf Page 5 of 6 Pain Management and Medication Current Pain Management: Electronic Signature(s) Signed: 10/05/2022 11:26:19 AM By: Worthy Rancher Entered By: Worthy Rancher on 10/05/2022 08:34:49 -------------------------------------------------------------------------------- Patient/Caregiver Education Details Patient Name: Date of Service: [name] [name] [name], STEPHA NIE W. 4/1/2024andnbsp8:30 A M Medical Record Number: [id number] Patient Account Number: [id number] Date of Birth/Gender: Treating RN: 12-Apr-1971 (52 y.o. [name] Physician: [name] Other Clinician: Referring Physician: Treating Physician/Extender: Shea Stakes in Treatment: 80 Education Assessment Education Provided To: Patient Education Topics Provided Safety: Methods: Explain/Verbal Responses: Reinforcements needed, State content correctly Electronic Signature(s) Signed: 10/05/2022 4:49:56 PM By: [name] Entered By: [name] on 10/05/2022 09:03:14 -------------------------------------------------------------------------------- Wound Assessment Details Patient Name: Date of Service: [name] Mattes. 10/05/2022 8:30 A M Medical Record Number: [id number] Patient Account Number: [id number] Date of Birth/Sex: Treating  RN: [birth year] (52 y.o. F) Primary Care Juliah Scadden: [name] Other Clinician: Referring Jessamy Torosyan: Treating Jerrit Horen/Extender: [name], [name] Weeks in Treatment: 23 Wound Status Wound Number: 1 Primary Etiology: Malignant Wound Wound Location: Left Breast Wound Status: Healed - Epithelialized Wounding Event: Surgical Injury Comorbid History: Lymphedema Date Acquired: 04/03/2022 Weeks Of Treatment: 23 Clustered Wound: No [name] [name] [name], [name] [name] [name] ([id number]) [id number] Page 6 of 6 Photos Wound Measurements Length: (cm) Width: (cm) Depth: (cm) Area: (cm) Volume: (cm) 0 % Reduction in Area: 100% 0 % Reduction in Volume: 100% 0 Epithelialization: Large (67-100%) 0 Tunneling: No 0 Undermining: No Wound Description Classification: Full Thickness Without Exposed Support Structures Wound Margin: Distinct, outline attached Exudate Amount: None Present Foul Odor After Cleansing: No Slough/Fibrino No Wound Bed Granulation Amount: None Present (0%) Exposed Structure Necrotic Amount: None Present (0%) Fascia Exposed: No Fat Layer (Subcutaneous Tissue) Exposed: No Tendon Exposed: No Muscle Exposed: No Joint Exposed: No Bone Exposed: No Periwound Skin Texture Texture Color No Abnormalities Noted: No No Abnormalities Noted: Yes Induration: Yes Temperature / Pain Temperature: No Abnormality Moisture No Abnormalities Noted: Yes Electronic Signature(s) Signed: 10/05/2022 4:49:56 PM By: [name] Entered By: [name] on 10/05/2022 08:57:23 -------------------------------------------------------------------------------- Vitals Details Patient Name: Date of Service: [name] [name] [name], [name]  W. 10/05/2022 8:30 A M Medical Record Number: [id number] Patient Account Number: [id number] Date of Birth/Sex: Treating RN: 1971-04-12 (52 y.o. F) Primary Care Asusena Sigley: [name] Other Clinician: Referring Naithan Delage: Treating  Jaelynn Pozo/Extender: [name], [name] Weeks in Treatment: 23 Vital Signs Time Taken: 08:34 Temperature (F): 98.0 Height (in): 63 Pulse (bpm): 75 Weight (lbs): 170 Respiratory Rate (breaths/min): 20 Body Mass Index (BMI): 30.1 Blood Pressure (mmHg): 126/74 Reference Range: 80 - 120 mg / dl Electronic Signature(s) Signed: 10/05/2022 11:26:19 AM By: Worthy Rancher Entered By: Worthy Rancher on 10/05/2022 08:34:40

## 2022-10-08 ENCOUNTER — Other Ambulatory Visit: Payer: Self-pay | Admitting: General Surgery

## 2022-10-08 ENCOUNTER — Ambulatory Visit
Admission: RE | Admit: 2022-10-08 | Discharge: 2022-10-08 | Disposition: A | Discharge status: Home / Self Care | Payer: 59 | Source: Ambulatory Visit | Attending: General Surgery | Admitting: General Surgery

## 2022-10-08 DIAGNOSIS — S21002S Unspecified open wound of left breast, sequela: Secondary | ICD-10-CM | POA: Diagnosis not present

## 2022-10-08 DIAGNOSIS — Z853 Personal history of malignant neoplasm of breast: Secondary | ICD-10-CM | POA: Insufficient documentation

## 2022-10-08 DIAGNOSIS — R921 Mammographic calcification found on diagnostic imaging of breast: Secondary | ICD-10-CM

## 2022-10-08 DIAGNOSIS — R922 Inconclusive mammogram: Secondary | ICD-10-CM | POA: Diagnosis not present

## 2022-10-15 ENCOUNTER — Telehealth: Payer: Self-pay | Admitting: *Deleted

## 2022-10-15 NOTE — Telephone Encounter (Signed)
Patient called and left message that she wants a return call about some side effects she is having from her letrozole. I returned her call and it went to voice mail. I left her a message to please return my call if she is still in needs to speaking with [place].

## 2022-10-15 NOTE — Telephone Encounter (Signed)
Call returned to patient and advised to hold her Letrozole for 1 - 2 weeks per Dr Orlie Dakin and to call us back to let us know if this has helped or not. She agrees to do so

## 2022-10-15 NOTE — Telephone Encounter (Signed)
Patient called back reporting that sh ehas been having jooint pain since starting the Letrozole and that she has also been unable to sleep well for past few months which she recently read is also a side effects of letrozole. She is asking if there is something else you can put her on. DO you want her to hold Letrozole for a few weeks to see if she improves and then order something else or what would you like to do? Please advise

## 2022-10-27 ENCOUNTER — Other Ambulatory Visit (HOSPITAL_COMMUNITY): Payer: Self-pay

## 2022-10-27 DIAGNOSIS — G2581 Restless legs syndrome: Secondary | ICD-10-CM | POA: Diagnosis not present

## 2022-10-27 DIAGNOSIS — M313 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: Secondary | ICD-10-CM | POA: Diagnosis not present

## 2022-10-27 DIAGNOSIS — Z7960 Long term (current) use of unspecified immunomodulators and immunosuppressants: Secondary | ICD-10-CM | POA: Diagnosis not present

## 2022-10-27 MED ORDER — HYDROXYCHLOROQUINE SULFATE 200 MG PO TABS
200.0000 mg | ORAL_TABLET | Freq: Two times a day (BID) | ORAL | 5 refills | Status: AC
  Filled 2022-10-27: qty 60, 30d supply, fill #0
  Filled 2022-12-03: qty 60, 30d supply, fill #1
  Filled 2023-09-09: qty 60, 30d supply, fill #2

## 2022-10-29 ENCOUNTER — Other Ambulatory Visit: Payer: Self-pay | Admitting: Rheumatology

## 2022-10-29 DIAGNOSIS — M313 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: Secondary | ICD-10-CM

## 2022-10-30 DIAGNOSIS — Z170 Estrogen receptor positive status [ER+]: Secondary | ICD-10-CM | POA: Diagnosis not present

## 2022-10-30 DIAGNOSIS — B999 Unspecified infectious disease: Secondary | ICD-10-CM | POA: Diagnosis not present

## 2022-10-30 DIAGNOSIS — Z9882 Breast implant status: Secondary | ICD-10-CM | POA: Diagnosis not present

## 2022-10-30 DIAGNOSIS — Z7960 Long term (current) use of unspecified immunomodulators and immunosuppressants: Secondary | ICD-10-CM | POA: Diagnosis not present

## 2022-10-30 DIAGNOSIS — M313 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: Secondary | ICD-10-CM | POA: Diagnosis not present

## 2022-10-30 DIAGNOSIS — C50412 Malignant neoplasm of upper-outer quadrant of left female breast: Secondary | ICD-10-CM | POA: Diagnosis not present

## 2022-11-03 ENCOUNTER — Other Ambulatory Visit
Admission: RE | Admit: 2022-11-03 | Discharge: 2022-11-03 | Disposition: A | Discharge status: Home / Self Care | Payer: 59 | Attending: Rheumatology | Admitting: Rheumatology

## 2022-11-03 DIAGNOSIS — M313 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: Secondary | ICD-10-CM | POA: Insufficient documentation

## 2022-11-03 DIAGNOSIS — Z7960 Long term (current) use of unspecified immunomodulators and immunosuppressants: Secondary | ICD-10-CM | POA: Insufficient documentation

## 2022-11-03 LAB — CBC WITH DIFFERENTIAL/PLATELET
Abs Immature Granulocytes: 0.01 10*3/uL (ref 0.00–0.07)
Basophils Absolute: 0 10*3/uL (ref 0.0–0.1)
Basophils Relative: 1 %
Eosinophils Absolute: 0.3 10*3/uL (ref 0.0–0.5)
Eosinophils Relative: 7 %
HCT: 36 % (ref 36.0–46.0)
Hemoglobin: 11.7 g/dL — ABNORMAL LOW (ref 12.0–15.0)
Immature Granulocytes: 0 %
Lymphocytes Relative: 27 %
Lymphs Abs: 1.1 10*3/uL (ref 0.7–4.0)
MCH: 28.9 pg (ref 26.0–34.0)
MCHC: 32.5 g/dL (ref 30.0–36.0)
MCV: 88.9 fL (ref 80.0–100.0)
Monocytes Absolute: 0.4 10*3/uL (ref 0.1–1.0)
Monocytes Relative: 9 %
Neutro Abs: 2.2 10*3/uL (ref 1.7–7.7)
Neutrophils Relative %: 56 %
Platelets: 241 10*3/uL (ref 150–400)
RBC: 4.05 MIL/uL (ref 3.87–5.11)
RDW: 15.8 % — ABNORMAL HIGH (ref 11.5–15.5)
WBC: 3.9 10*3/uL — ABNORMAL LOW (ref 4.0–10.5)
nRBC: 0 % (ref 0.0–0.2)

## 2022-11-03 LAB — COMPREHENSIVE METABOLIC PANEL
ALT: 11 U/L (ref 0–44)
AST: 21 U/L (ref 15–41)
Albumin: 4.1 g/dL (ref 3.5–5.0)
Alkaline Phosphatase: 64 U/L (ref 38–126)
Anion gap: 8 (ref 5–15)
BUN: 19 mg/dL (ref 6–20)
CO2: 26 mmol/L (ref 22–32)
Calcium: 9.6 mg/dL (ref 8.9–10.3)
Chloride: 105 mmol/L (ref 98–111)
Creatinine, Ser: 0.66 mg/dL (ref 0.44–1.00)
GFR, Estimated: >60 mL/min (ref >60)
Glucose, Bld: 99 mg/dL (ref 70–99)
Potassium: 4.4 mmol/L (ref 3.5–5.1)
Sodium: 139 mmol/L (ref 135–145)
Total Bilirubin: 1 mg/dL (ref 0.3–1.2)
Total Protein: 7.3 g/dL (ref 6.5–8.1)

## 2022-11-03 LAB — URINALYSIS, COMPLETE (UACMP) WITH MICROSCOPIC
Bacteria, UA: NONE SEEN
Bilirubin Urine: NEGATIVE
Glucose, UA: NEGATIVE mg/dL
Hgb urine dipstick: NEGATIVE
Ketones, ur: NEGATIVE mg/dL
Leukocytes,Ua: NEGATIVE
Nitrite: NEGATIVE
Protein, ur: NEGATIVE mg/dL
Specific Gravity, Urine: 1.012 (ref 1.005–1.030)
Squamous Epithelial / HPF: NONE SEEN /HPF (ref 0–5)
WBC, UA: NONE SEEN WBC/hpf (ref 0–5)
pH: 5 (ref 5.0–8.0)

## 2022-11-03 LAB — SEDIMENTATION RATE: Sed Rate: 19 mm/hr (ref 0–30)

## 2022-11-03 LAB — C-REACTIVE PROTEIN: CRP: <0.5 mg/dL (ref <1.0)

## 2022-11-04 ENCOUNTER — Encounter: Payer: Self-pay | Admitting: Oncology

## 2022-11-04 LAB — ANCA PROFILE
Anti-MPO Antibodies: <0.2 units (ref 0.0–0.9)
Anti-PR3 Antibodies: <0.2 units (ref 0.0–0.9)
Atypical P-ANCA titer: <1:20 {titer}
C-ANCA: <1:20 {titer}
P-ANCA: <1:20 {titer}

## 2022-11-05 ENCOUNTER — Other Ambulatory Visit: Payer: Self-pay

## 2022-11-05 ENCOUNTER — Other Ambulatory Visit: Payer: Self-pay | Admitting: *Deleted

## 2022-11-05 MED ORDER — ANASTROZOLE 1 MG PO TABS
1.0000 mg | ORAL_TABLET | Freq: Every day | ORAL | 3 refills | Status: DC
  Filled 2022-11-05 – 2022-11-09 (×2): qty 30, 30d supply, fill #0
  Filled 2022-12-01: qty 30, 30d supply, fill #1

## 2022-11-05 NOTE — Progress Notes (Signed)
ana

## 2022-11-08 ENCOUNTER — Other Ambulatory Visit (HOSPITAL_COMMUNITY): Payer: Self-pay

## 2022-11-09 ENCOUNTER — Other Ambulatory Visit (HOSPITAL_COMMUNITY): Payer: Self-pay

## 2022-11-09 ENCOUNTER — Other Ambulatory Visit: Payer: Self-pay

## 2022-11-10 ENCOUNTER — Other Ambulatory Visit (HOSPITAL_COMMUNITY): Payer: Self-pay

## 2022-11-10 DIAGNOSIS — H52223 Regular astigmatism, bilateral: Secondary | ICD-10-CM | POA: Diagnosis not present

## 2022-11-10 DIAGNOSIS — H524 Presbyopia: Secondary | ICD-10-CM | POA: Diagnosis not present

## 2022-11-10 DIAGNOSIS — H5213 Myopia, bilateral: Secondary | ICD-10-CM | POA: Diagnosis not present

## 2022-11-10 MED ORDER — FOLIC ACID 1 MG PO TABS
1.0000 mg | ORAL_TABLET | Freq: Every day | ORAL | 3 refills | Status: AC
  Filled 2022-11-10 – 2022-12-01 (×2): qty 90, 90d supply, fill #0
  Filled 2022-12-22 – 2023-06-29 (×3): qty 90, 90d supply, fill #1

## 2022-11-10 MED ORDER — METHOTREXATE SODIUM 2.5 MG PO TABS
20.0000 mg | ORAL_TABLET | ORAL | 1 refills | Status: AC
  Filled 2022-11-10 – 2022-12-03 (×2): qty 96, 84d supply, fill #0
  Filled 2022-12-22: qty 96, 84d supply, fill #1

## 2022-11-20 ENCOUNTER — Other Ambulatory Visit (HOSPITAL_COMMUNITY): Payer: Self-pay

## 2022-11-24 ENCOUNTER — Other Ambulatory Visit (HOSPITAL_COMMUNITY): Payer: Self-pay

## 2022-12-01 ENCOUNTER — Other Ambulatory Visit (HOSPITAL_COMMUNITY): Payer: Self-pay

## 2022-12-01 MED ORDER — MONTELUKAST SODIUM 10 MG PO TABS
10.0000 mg | ORAL_TABLET | Freq: Every day | ORAL | 6 refills | Status: DC
  Filled 2022-12-01 – 2022-12-03 (×2): qty 30, 30d supply, fill #0
  Filled 2022-12-22 – 2023-01-26 (×2): qty 30, 30d supply, fill #1
  Filled 2023-02-21 – 2023-04-23 (×2): qty 30, 30d supply, fill #2
  Filled 2023-06-17 – 2023-06-29 (×2): qty 30, 30d supply, fill #3
  Filled 2023-08-02: qty 90, 90d supply, fill #4

## 2022-12-02 ENCOUNTER — Other Ambulatory Visit (HOSPITAL_COMMUNITY): Payer: Self-pay

## 2022-12-03 ENCOUNTER — Other Ambulatory Visit: Payer: Self-pay

## 2022-12-03 ENCOUNTER — Other Ambulatory Visit (HOSPITAL_COMMUNITY): Payer: Self-pay

## 2022-12-04 ENCOUNTER — Other Ambulatory Visit: Payer: Self-pay

## 2022-12-07 ENCOUNTER — Encounter: Payer: Self-pay | Admitting: Oncology

## 2022-12-15 ENCOUNTER — Encounter: Payer: Self-pay | Admitting: Plastic Surgery

## 2022-12-15 ENCOUNTER — Ambulatory Visit (INDEPENDENT_AMBULATORY_CARE_PROVIDER_SITE_OTHER): Payer: 59 | Admitting: Plastic Surgery

## 2022-12-15 VITALS — BP 123/71 | HR 79 | Ht 63.0 in | Wt 175.0 lb

## 2022-12-15 DIAGNOSIS — Z923 Personal history of irradiation: Secondary | ICD-10-CM

## 2022-12-15 DIAGNOSIS — N6489 Other specified disorders of breast: Secondary | ICD-10-CM | POA: Insufficient documentation

## 2022-12-15 DIAGNOSIS — Z9882 Breast implant status: Secondary | ICD-10-CM

## 2022-12-15 DIAGNOSIS — N651 Disproportion of reconstructed breast: Secondary | ICD-10-CM

## 2022-12-15 NOTE — Progress Notes (Signed)
Patient ID: [name], female    DOB: Jun 27, 1971, 52 y.o.   MRN: [id number]   Chief Complaint  Patient presents with   Advice Only   Breast Problem    The patient is a very nice 52 year old female here with her husband for consultation for breast reconstruction.  The patient presented in May 2023 with a history of left breast cancer with DCIS.  The pathology showed ER positive PR positive.She underwent a partial lumpectomy of the left breast in the lateral aspect.  Postoperatively she had radiation.  She had saline breast implants placed approximately 14 years ago.  She is not sure what size they are but she does have a card at home and will send [place] a picture.  Since the time of the radiation she has had a very difficult time healing the skin around the lumpectomy site.  She underwent hyperbaric treatment.  The area is now closed.  The scar is quite fresh and it has only been closed for about 2 months.  It is time for the implants to be removed and she does have significant asymmetry with the right breast about a cup size larger than the left.  She does complain the area hurts in the it is difficult to get into a bra because of the size differences.  She is 5 feet 3 inches tall weighs 175 pounds.     Review of Systems  Constitutional: Negative.   Eyes: Negative.   Respiratory: Negative.  Negative for chest tightness.   Cardiovascular: Negative.   Gastrointestinal: Negative.   Endocrine: Negative.   Genitourinary: Negative.   Musculoskeletal: Negative.     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)    [name]---  dx 2015,  takes methotrexate wkly, pt stated she is stable    Past Surgical History:  Procedure Laterality Date   ANTERIOR AND POSTERIOR REPAIR N/A 01/14/2021   Procedure: ANTERIOR (CYSTOCELE) AND POSTERIOR REPAIR (RECTOCELE);  Surgeon: Lavina Hamman, MD;  Location: [facility];  Service: Gynecology;  Laterality: N/A;   AUGMENTATION MAMMAPLASTY Bilateral 2010   saline   BREAST BIOPSY Left 10/23/2021   Stereo Bx, X-clip, Ductal carcinoma in situ   BREAST CYST EXCISION Left 03/25/2022   Procedure: EXCISION OF CHRONIC LEFT BREAST WOUND;  Surgeon: Almond Lint, MD;  Location: [facility];  Service: General;  Laterality: Left;   BREAST ENHANCEMENT SURGERY  2009   BREAST LUMPECTOMY     BREAST LUMPECTOMY WITH RADIOACTIVE SEED AND SENTINEL LYMPH NODE BIOPSY Left 11/12/2021   Procedure: LEFT BREAST LUMPECTOMY WITH RADIOACTIVE SEED AND SENTINEL LYMPH NODE BIOPSY;  Surgeon: Almond Lint, MD;  Location: [facility];  Service: General;  Laterality: Left;   CARPAL TUNNEL RELEASE Right 2011   LAPAROSCOPIC SUPRACERVICAL HYSTERECTOMY  02/21/2010   @ [facility] by Dr Jackelyn Knife   NASAL SEPTUM SURGERY  01/2018  insertion button prosthesis for septal perforation   RHINOPLASTY  2005   nasal fracture      Current Outpatient Medications:    folic acid (FOLVITE) 1 MG tablet, Take 1 tablet (1 mg total) by mouth daily., Disp: 90 tablet, Rfl: 3   gabapentin (NEURONTIN) 300 MG capsule, Take 1 capsule (300 mg total) by mouth at bedtime., Disp: 30 capsule, Rfl: 2   hydroxychloroquine (PLAQUENIL) 200 MG tablet, Take 1 tablet (200 mg total) by mouth 2 (two) times daily., Disp: 60 tablet, Rfl: 5   methotrexate (RHEUMATREX) 2.5 MG tablet, Take 8 tablets (20 mg total) by mouth every 7 (seven) days., Disp: 96 tablet, Rfl:  1   montelukast (SINGULAIR) 10 MG tablet, Take 1 tablet (10 mg total) by mouth daily., Disp: 30 tablet, Rfl: 6   pantoprazole (PROTONIX) 40 MG tablet, Take 1 tablet (40 mg total) by mouth 2 (two) times daily., Disp: 180 tablet, Rfl: 1   sertraline (ZOLOFT) 100 MG tablet, Take 1 tablet (100 mg total) by mouth daily., Disp: 90 tablet, Rfl: 2   Objective:   Vitals:   12/15/22 0928  BP: 123/71  Pulse: 79  SpO2: 98%    Physical Exam Vitals reviewed.  Constitutional:      Appearance: Normal appearance.  HENT:     Head: Normocephalic and atraumatic.  Cardiovascular:     Rate and Rhythm: Normal rate.     Pulses: Normal pulses.  Pulmonary:     Effort: Pulmonary effort is normal.  Chest:    Abdominal:     Palpations: Abdomen is soft.  Musculoskeletal:        General: Tenderness present. No swelling or deformity.  Skin:    General: Skin is warm.     Capillary Refill: Capillary refill takes less than 2 seconds.     Coloration: Skin is not jaundiced.     Findings: No bruising.  Neurological:     Mental Status: She is alert and oriented to person, place, and time.  Psychiatric:        Mood and Affect: Mood normal.        Behavior: Behavior normal.        Thought Content: Thought content normal.        Judgment: Judgment normal.     Assessment & Plan:  Postoperative breast asymmetry  We talked about different options for her at this time.  I think it is reasonable to remove and replace her implants with smaller implants and likely different size to get better symmetry.  I recommend that she wait to do the fat grafting of the left breast until that is more healed and further time has passed.  It is still fresh.  She could also benefit from a mastopexy / reduction on the right for better symmetry  Alena Bills Wei Newbrough, DO

## 2022-12-16 ENCOUNTER — Telehealth: Payer: Self-pay

## 2022-12-16 NOTE — Telephone Encounter (Signed)
Faxed referral including demographics, insurance card and most recent ov note to [facility] [employer]. Received fax success confirmation; forwarded order to front desk for batch scanning.

## 2022-12-22 ENCOUNTER — Telehealth: Payer: Self-pay | Admitting: Plastic Surgery

## 2022-12-22 ENCOUNTER — Other Ambulatory Visit (HOSPITAL_COMMUNITY): Payer: Self-pay

## 2022-12-22 ENCOUNTER — Other Ambulatory Visit: Payer: Self-pay

## 2022-12-22 ENCOUNTER — Inpatient Hospital Stay: Payer: 59 | Attending: Oncology | Admitting: Oncology

## 2022-12-22 ENCOUNTER — Telehealth: Payer: Self-pay | Admitting: *Deleted

## 2022-12-22 DIAGNOSIS — D0512 Intraductal carcinoma in situ of left breast: Secondary | ICD-10-CM

## 2022-12-22 MED ORDER — PANTOPRAZOLE SODIUM 40 MG PO TBEC
40.0000 mg | DELAYED_RELEASE_TABLET | Freq: Two times a day (BID) | ORAL | 1 refills | Status: DC
  Filled 2022-12-22 – 2022-12-28 (×2): qty 180, 90d supply, fill #0
  Filled 2023-06-17 – 2023-06-29 (×2): qty 180, 90d supply, fill #1

## 2022-12-22 NOTE — Telephone Encounter (Signed)
Returned patients call, let her know I would have her auth started by the end of the week.

## 2022-12-22 NOTE — Telephone Encounter (Signed)
Pt called, [name], Raxter [phone] Consult for Breast Reconstruction for Breast Cancer saw Dr D 6-11, yes I know that is recent, if it is in new routes does that mean its still being worked on? Pt called asking when her information would be sent to insurance. Thank you

## 2022-12-23 ENCOUNTER — Other Ambulatory Visit: Payer: Self-pay

## 2022-12-23 MED ORDER — CITALOPRAM HYDROBROMIDE 10 MG PO TABS
10.0000 mg | ORAL_TABLET | Freq: Every day | ORAL | 1 refills | Status: DC
  Filled 2022-12-23 – 2022-12-28 (×2): qty 60, 60d supply, fill #0

## 2022-12-23 NOTE — Progress Notes (Signed)
[facility]  Telephone:(336) [phone] [phone]) [phone]  ID: [name] OB: [birth year]  MR#: [id number]  AOZ#:[id number]  Patient Care Team: Ollen Bowl, MD as PCP - General (Internal Medicine)  I connected with [name] on 12/23/22 at  3:30 PM EDT by video enabled telemedicine visit and verified that I am speaking with the correct person using two identifiers.   I discussed the limitations, risks, security and privacy concerns of performing an evaluation and management service by telemedicine and the availability of in-person appointments. I also discussed with the patient that there may be a patient responsible charge related to this service. The patient expressed understanding and agreed to proceed.   Other persons participating in the visit and their role in the encounter: Patient, MD.  Patient's location: Home. Provider's location: Clinic.   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  INTERVAL HISTORY: Patient agreed to video assisted telemedicine visit for routine evaluation.  She discontinued anastrozole and letrozole secondary to intolerable joint pain and side effects.  She has now completed hyperbaric treatment to her breast 1 and is consulting with plastic surgery for consideration of reconstruction she currently feels well. She has no neurologic complaints.  She denies any recent fevers or illnesses.  She has a good appetite and denies weight loss.  She has no chest pain, shortness of breath, cough, or hemoptysis.  She denies any nausea, vomiting, constipation, or diarrhea.  She has no urinary complaints.  Patient offers no further specific complaints today.  REVIEW OF SYSTEMS:   Review of Systems  Constitutional: Negative.  Negative for fever, malaise/fatigue and weight loss.  Respiratory: Negative.  Negative for cough, hemoptysis and shortness of breath.   Cardiovascular:  Negative for chest pain  and leg swelling.  Gastrointestinal: Negative.  Negative for abdominal pain.  Genitourinary: Negative.  Negative for dysuria.  Musculoskeletal: Negative.  Negative for back pain.  Skin: Negative.  Negative for rash.  Neurological: Negative.  Negative for dizziness, focal weakness, weakness and headaches.  Psychiatric/Behavioral: Negative.  The patient is not nervous/anxious.     As per HPI. Otherwise, a complete review of systems is negative.  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)    [name]---  dx 2015,  takes methotrexate wkly, pt stated she is stable    PAST SURGICAL HISTORY: Past Surgical History:  Procedure Laterality Date   ANTERIOR AND POSTERIOR REPAIR N/A 01/14/2021   Procedure: ANTERIOR (CYSTOCELE) AND POSTERIOR REPAIR (RECTOCELE);  Surgeon: Lavina Hamman, MD;  Location: [facility];  Service: Gynecology;  Laterality: N/A;   AUGMENTATION MAMMAPLASTY Bilateral 2010   saline   BREAST BIOPSY Left 10/23/2021   Stereo Bx, X-clip, Ductal carcinoma in situ   BREAST CYST EXCISION Left 03/25/2022   Procedure: EXCISION OF CHRONIC LEFT BREAST WOUND;  Surgeon: Almond Lint, MD;  Location: [facility];  Service: General;  Laterality: Left;   BREAST ENHANCEMENT SURGERY  2009   BREAST LUMPECTOMY     BREAST LUMPECTOMY WITH RADIOACTIVE SEED AND SENTINEL LYMPH NODE BIOPSY Left 11/12/2021   Procedure: LEFT BREAST  LUMPECTOMY WITH RADIOACTIVE SEED AND SENTINEL LYMPH NODE BIOPSY;  Surgeon: Almond Lint, MD;  Location: [facility];  Service: General;  Laterality: Left;   CARPAL TUNNEL RELEASE Right 2011   LAPAROSCOPIC SUPRACERVICAL HYSTERECTOMY  02/21/2010   @ [facility] by Dr Jackelyn Knife   NASAL SEPTUM SURGERY  01/2018   insertion button prosthesis for septal perforation   RHINOPLASTY  2005   nasal fracture    FAMILY HISTORY: Family History  Problem Relation Age of Onset   Asthma Mother    Depression Mother    Diabetes Mother    Hyperlipidemia Mother    Hypertension Mother    Mental illness Mother    Arthritis Mother    Pancreatic cancer Mother    Heart disease Father 69       AMI/CABG   Mental illness Sister    Myasthenia gravis Daughter    Charcot-Marie-Tooth disease Daughter    ADD / ADHD Daughter    Pancreatic cancer Maternal Aunt    Alzheimer's disease Maternal Grandmother    Hyperlipidemia Maternal Grandmother    Hypertension Maternal Grandmother    Heart attack Maternal Grandfather    Lung cancer Maternal Grandfather    Breast cancer Paternal Grandmother     ADVANCED DIRECTIVES (Y/N):  N  HEALTH MAINTENANCE: Social History   Tobacco Use   Smoking status: Never   Smokeless tobacco: Never  Vaping Use   Vaping Use: Never used  Substance Use Topics   Alcohol use: Yes    Comment: occasional   Drug use: Never     Colonoscopy:  PAP:  Bone density:  Lipid panel:  Allergies  Allergen Reactions   Kiwi Extract Shortness Of Breath and Swelling   Ativan [Lorazepam] Other (See Comments)    Hyperactivity per patient   Erythromycin Base Nausea And Vomiting   Nitrofurantoin Nausea And Vomiting   Other     Latex self dissolving sutures "work their way out"   Xanax [Alprazolam] Other (See Comments)    Makes patient hyperactive     Current Outpatient Medications  Medication Sig Dispense Refill   citalopram (CELEXA) 10 MG tablet Take 1 tablet (10 mg total) by mouth  daily. 60 tablet 1   folic acid (FOLVITE) 1 MG tablet Take 1 tablet (1 mg total) by mouth daily. 90 tablet 3   gabapentin (NEURONTIN) 300 MG capsule Take 1 capsule (300 mg total) by mouth at bedtime. 30 capsule 2   hydroxychloroquine (PLAQUENIL) 200 MG tablet Take 1 tablet (200 mg total) by mouth 2 (two) times daily. 60 tablet 5   methotrexate (RHEUMATREX) 2.5 MG tablet Take 8 tablets (20 mg total) by mouth every 7 (seven) days. 96 tablet 1   montelukast (SINGULAIR) 10 MG tablet Take 1 tablet (10 mg total) by mouth daily. 30 tablet 6   pantoprazole (PROTONIX) 40 MG tablet Take 1 tablet (40 mg total) by mouth 2 (two) times daily. 180 tablet 1   sertraline (ZOLOFT) 100 MG tablet Take 1 tablet (100 mg total) by mouth daily. 90 tablet 2   No current facility-administered medications for this visit.    OBJECTIVE: There were no vitals filed for this visit.     There is no height or weight on file to calculate BMI.  ECOG FS:0 - Asymptomatic  General: Well-developed, well-nourished, no acute distress. HEENT: Normocephalic. Neuro: Alert, answering all questions appropriately. Cranial nerves grossly intact. Psych: Normal affect.  LAB RESULTS:  Lab Results  Component Value Date   NA 139 11/03/2022   K 4.4 11/03/2022   CL 105 11/03/2022   CO2 26 11/03/2022   GLUCOSE 99 11/03/2022   BUN 19 11/03/2022   CREATININE 0.66 11/03/2022   CALCIUM 9.6 11/03/2022   PROT 7.3 11/03/2022   ALBUMIN 4.1 11/03/2022   AST 21 11/03/2022   ALT 11 11/03/2022   ALKPHOS 64 11/03/2022   BILITOT 1.0 11/03/2022   GFRNONAA >60 11/03/2022   GFRAA 121 06/10/2016    Lab Results  Component Value Date   WBC 3.9 (L) 11/03/2022   NEUTROABS 2.2 11/03/2022   HGB 11.7 (L) 11/03/2022   HCT 36.0 11/03/2022   MCV 88.9 11/03/2022   PLT 241 11/03/2022     STUDIES: No results found.  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  PLAN:    Left breast DCIS with small focus of invasive  carcinoma: Lesion is ER/PR positive.  Patient underwent lumpectomy on Nov 12, 2021.  She has had multiple postsurgical complications with wound infection requiring multiple procedures including hyperbaric treatment.  Patient attributes much of her ongoing issues to XRT.  Patient cannot tolerate letrozole or anastrozole.  Current plan is to consider tamoxifen treatment and have her primary care switch her antidepressant from Zoloft to Celexa.  Patient will call clinic after discussing with her primary care at which time a prescription can be called in.  Patient's most recent mammogram on October 08, 2022 was reported as BI-RADS 1.  Repeat in April 2025.  Patient will have video-assisted telemedicine visit in 3 months for routine evaluation.   Genetics: Patient was tested in January 2022 and this was reported as negative.   Bone health: Bone mineral density on June 23, 2022 was reported as normal.  Repeat in December 2025.   Breast wound: Continue follow-up with plastic surgery as indicated.  I provided 20 minutes of face-to-face video visit time during this encounter which included chart review, counseling, and coordination of care as documented above.       Patient expressed understanding and was in agreement with this plan. She also understands that She can call clinic at any time with any questions, concerns, or complaints.    Cancer Staging  Ductal carcinoma in situ (DCIS) of left breast Staging form: Breast, AJCC 8th Edition - Clinical: Stage IA (cT91mi, cN0, cM0, G1, ER+, PR+, HER2-) - Signed by Jeralyn Ruths, MD on 11/28/2021 Histologic grading system: 3 grade system   Jeralyn Ruths, MD   12/23/2022 2:34 PM

## 2022-12-28 ENCOUNTER — Other Ambulatory Visit (HOSPITAL_COMMUNITY): Payer: Self-pay

## 2022-12-28 ENCOUNTER — Other Ambulatory Visit: Payer: Self-pay

## 2022-12-31 ENCOUNTER — Other Ambulatory Visit: Payer: Self-pay

## 2022-12-31 ENCOUNTER — Other Ambulatory Visit (HOSPITAL_COMMUNITY): Payer: Self-pay

## 2022-12-31 MED ORDER — TAMOXIFEN CITRATE 20 MG PO TABS
20.0000 mg | ORAL_TABLET | Freq: Every day | ORAL | 0 refills | Status: DC
  Filled 2022-12-31: qty 90, 90d supply, fill #0

## 2023-01-01 ENCOUNTER — Ambulatory Visit: Payer: 59 | Attending: General Surgery | Admitting: Rehabilitation

## 2023-01-01 ENCOUNTER — Encounter: Payer: Self-pay | Admitting: Rehabilitation

## 2023-01-01 DIAGNOSIS — C50912 Malignant neoplasm of unspecified site of left female breast: Secondary | ICD-10-CM | POA: Insufficient documentation

## 2023-01-01 DIAGNOSIS — Z170 Estrogen receptor positive status [ER+]: Secondary | ICD-10-CM | POA: Insufficient documentation

## 2023-01-01 DIAGNOSIS — Z483 Aftercare following surgery for neoplasm: Secondary | ICD-10-CM | POA: Insufficient documentation

## 2023-01-01 NOTE — Therapy (Signed)
OUTPATIENT PHYSICAL THERAPY SOZO SCREENING NOTE   Patient Name: [name] MRN: [id number] DOB:April 18, 1971, 52 y.o., female Today's Date: 01/01/2023  PCP: Ollen Bowl, MD REFERRING PROVIDER: Almond Lint, MD   PT End of Session - 01/01/23 1150     Visit Number 12   screen only   PT Start Time 1057    PT Stop Time 1100    PT Time Calculation (min) 3 min    Activity Tolerance Patient tolerated treatment well    Behavior During Therapy [facility] for tasks assessed/performed             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   Female cystocele    symptomatic   GAD (generalized anxiety disorder)    GERD (gastroesophageal reflux disease)    Hiatal hernia    History of 2019 novel coronavirus disease (COVID-19) 07/2020   per pt had mild symptoms that resolved   MDD (major depressive disorder)    Mixed incontinence urge and stress    Wears glasses    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)    [name]---  dx 2015,  takes methotrexate wkly, pt stated she is stable   Past Surgical History:  Procedure Laterality Date   ANTERIOR AND POSTERIOR REPAIR N/A 01/14/2021   Procedure: ANTERIOR (CYSTOCELE) AND POSTERIOR REPAIR (RECTOCELE);  Surgeon: Lavina Hamman, MD;  Location: [facility];  Service: Gynecology;  Laterality: N/A;   AUGMENTATION MAMMAPLASTY Bilateral 2010   saline   BREAST BIOPSY Left 10/23/2021   Stereo Bx, X-clip, Ductal carcinoma in situ   BREAST CYST EXCISION Left 03/25/2022   Procedure: EXCISION OF CHRONIC LEFT BREAST WOUND;  Surgeon: Almond Lint, MD;  Location: [facility];  Service: General;  Laterality: Left;   BREAST ENHANCEMENT SURGERY  2009   BREAST LUMPECTOMY     BREAST LUMPECTOMY WITH RADIOACTIVE SEED AND SENTINEL LYMPH NODE BIOPSY Left 11/12/2021   Procedure:  LEFT BREAST LUMPECTOMY WITH RADIOACTIVE SEED AND SENTINEL LYMPH NODE BIOPSY;  Surgeon: Almond Lint, MD;  Location: [facility];  Service: General;  Laterality: Left;   CARPAL TUNNEL RELEASE Right 2011   LAPAROSCOPIC SUPRACERVICAL HYSTERECTOMY  02/21/2010   @ [facility] by Dr Jackelyn Knife   NASAL SEPTUM SURGERY  01/2018   insertion button prosthesis for septal perforation   RHINOPLASTY  2005   nasal fracture   Patient Active Problem List   Diagnosis Date Noted   Postoperative breast asymmetry 12/15/2022   Abscess    Cellulitis of left breast 04/03/2022   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) 02/07/2014   Generalized anxiety disorder 02/07/2014   Insomnia 02/07/2014   Sensorineural hearing loss of right ear 12/06/2013   Dermatitis 12/05/2013   Nasal septal  perforation 12/05/2013   Allergic rhinitis 10/12/2006   Asthma 10/12/2006   GERD 10/12/2006    REFERRING DIAG: left breast cancer at risk for lymphedema  THERAPY DIAG: Aftercare following surgery for neoplasm  Malignant neoplasm of left breast in female, estrogen receptor positive, unspecified site of breast [facility])  PERTINENT HISTORY: Patient was diagnosed on 10/23/21 with left DCIS (grade to be determined later). It measures 1.53mm and is located in the upper outer quadrant.  It is ER/PR +, HER2 -. Underwent a L lumpectomy and SLNB (0/3) on 11/12/21, Radiation to be completed 01/29/2022   PRECAUTIONS: left UE Lymphedema risk, None  SUBJECTIVE: Pt returns for her 3 month L-Dex screen.   PAIN:  Are you having pain? No  SOZO SCREENING: Patient was assessed today using the SOZO machine to determine the lymphedema index score. This was compared to her baseline score. It was determined that she is within the recommended range when compared to her baseline and no further action is needed at this time. She will continue SOZO screenings. These are done every 3 months for 2 years post operatively followed by every 6 months for 2 years, and then annually.      Idamae Lusher, PT 01/01/2023, 11:50 AM

## 2023-01-14 ENCOUNTER — Telehealth: Payer: Self-pay | Admitting: *Deleted

## 2023-01-14 NOTE — Telephone Encounter (Signed)
CPT [id number], [id number] [name] [phone]

## 2023-01-21 ENCOUNTER — Encounter (INDEPENDENT_AMBULATORY_CARE_PROVIDER_SITE_OTHER): Payer: Self-pay

## 2023-01-25 ENCOUNTER — Telehealth: Payer: Self-pay | Admitting: Plastic Surgery

## 2023-01-25 NOTE — Telephone Encounter (Signed)
Patient called to check on status of Pre-Op appt.  Insurance has approved Sx.  Please call her at [phone]

## 2023-01-26 ENCOUNTER — Other Ambulatory Visit (HOSPITAL_COMMUNITY): Payer: Self-pay

## 2023-01-26 ENCOUNTER — Other Ambulatory Visit: Payer: Self-pay

## 2023-01-26 MED ORDER — GABAPENTIN 300 MG PO CAPS
300.0000 mg | ORAL_CAPSULE | Freq: Every day | ORAL | 3 refills | Status: DC
  Filled 2023-01-26: qty 90, 90d supply, fill #0
  Filled 2023-02-21 – 2023-09-09 (×2): qty 90, 90d supply, fill #1

## 2023-01-27 ENCOUNTER — Other Ambulatory Visit: Payer: Self-pay

## 2023-01-27 ENCOUNTER — Other Ambulatory Visit (HOSPITAL_COMMUNITY): Payer: Self-pay

## 2023-01-27 NOTE — Telephone Encounter (Signed)
Spoke with [employer] who has requested with the nurse that an additional unit of [id number] be added to the approved case

## 2023-01-27 NOTE — Telephone Encounter (Signed)
Patient called back and left vm asking for update on this, please call back [phone]

## 2023-01-28 ENCOUNTER — Other Ambulatory Visit (HOSPITAL_COMMUNITY): Payer: Self-pay

## 2023-01-29 ENCOUNTER — Telehealth: Payer: Self-pay | Admitting: *Deleted

## 2023-01-29 NOTE — Telephone Encounter (Signed)
Called patient to schedule surgery. Patient just inquired about getting preop scheduled 4 days ago. Today patient stated she is not sure she wants to move forward as she is not entirely satisfied with Dr. Kittie Plater plan. I told patient I would let Dr. Ulice Bold know.  No surgery scheduled. Closing referral at patient request for no surgery at this time.

## 2023-02-02 ENCOUNTER — Telehealth: Payer: 59 | Admitting: Oncology

## 2023-02-09 ENCOUNTER — Telehealth: Payer: 59 | Admitting: Oncology

## 2023-02-22 ENCOUNTER — Other Ambulatory Visit: Payer: Self-pay

## 2023-03-04 ENCOUNTER — Other Ambulatory Visit (HOSPITAL_COMMUNITY): Payer: Self-pay

## 2023-03-12 ENCOUNTER — Other Ambulatory Visit (HOSPITAL_COMMUNITY): Payer: Self-pay

## 2023-03-12 DIAGNOSIS — J4520 Mild intermittent asthma, uncomplicated: Secondary | ICD-10-CM | POA: Diagnosis not present

## 2023-03-12 DIAGNOSIS — E669 Obesity, unspecified: Secondary | ICD-10-CM | POA: Diagnosis not present

## 2023-03-12 DIAGNOSIS — D0512 Intraductal carcinoma in situ of left breast: Secondary | ICD-10-CM | POA: Diagnosis not present

## 2023-03-12 DIAGNOSIS — E7800 Pure hypercholesterolemia, unspecified: Secondary | ICD-10-CM | POA: Diagnosis not present

## 2023-03-12 DIAGNOSIS — F32A Depression, unspecified: Secondary | ICD-10-CM | POA: Diagnosis not present

## 2023-03-12 DIAGNOSIS — E559 Vitamin D deficiency, unspecified: Secondary | ICD-10-CM | POA: Diagnosis not present

## 2023-03-12 DIAGNOSIS — M313 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: Secondary | ICD-10-CM | POA: Diagnosis not present

## 2023-03-12 DIAGNOSIS — F419 Anxiety disorder, unspecified: Secondary | ICD-10-CM | POA: Diagnosis not present

## 2023-03-12 DIAGNOSIS — Z0000 Encounter for general adult medical examination without abnormal findings: Secondary | ICD-10-CM | POA: Diagnosis not present

## 2023-03-12 DIAGNOSIS — G629 Polyneuropathy, unspecified: Secondary | ICD-10-CM | POA: Diagnosis not present

## 2023-03-12 DIAGNOSIS — G2581 Restless legs syndrome: Secondary | ICD-10-CM | POA: Diagnosis not present

## 2023-03-12 MED ORDER — ALBUTEROL SULFATE HFA 108 (90 BASE) MCG/ACT IN AERS
1.0000 | INHALATION_SPRAY | RESPIRATORY_TRACT | 1 refills | Status: AC | PRN
  Filled 2023-03-12: qty 6.7, 34d supply, fill #0

## 2023-03-12 MED ORDER — TRAZODONE HCL 50 MG PO TABS
50.0000 mg | ORAL_TABLET | Freq: Every evening | ORAL | 0 refills | Status: DC | PRN
  Filled 2023-03-12: qty 30, 30d supply, fill #0

## 2023-03-12 MED ORDER — FOLIC ACID 1 MG PO TABS
1.0000 mg | ORAL_TABLET | Freq: Every day | ORAL | 1 refills | Status: DC
  Filled 2023-03-12: qty 90, 90d supply, fill #0
  Filled 2023-06-17 – 2023-09-09 (×3): qty 90, 90d supply, fill #1

## 2023-03-15 ENCOUNTER — Other Ambulatory Visit (HOSPITAL_COMMUNITY): Payer: Self-pay

## 2023-03-20 ENCOUNTER — Encounter (HOSPITAL_BASED_OUTPATIENT_CLINIC_OR_DEPARTMENT_OTHER): Payer: Self-pay

## 2023-03-20 ENCOUNTER — Other Ambulatory Visit: Payer: Self-pay

## 2023-03-20 ENCOUNTER — Emergency Department (HOSPITAL_BASED_OUTPATIENT_CLINIC_OR_DEPARTMENT_OTHER): Payer: 59

## 2023-03-20 ENCOUNTER — Emergency Department (HOSPITAL_BASED_OUTPATIENT_CLINIC_OR_DEPARTMENT_OTHER)
Admission: EM | Admit: 2023-03-20 | Discharge: 2023-03-20 | Disposition: A | Discharge status: Home / Self Care | Payer: 59 | Attending: Emergency Medicine | Admitting: Emergency Medicine

## 2023-03-20 DIAGNOSIS — Y9339 Activity, other involving climbing, rappelling and jumping off: Secondary | ICD-10-CM | POA: Diagnosis not present

## 2023-03-20 DIAGNOSIS — S99912A Unspecified injury of left ankle, initial encounter: Secondary | ICD-10-CM | POA: Diagnosis present

## 2023-03-20 DIAGNOSIS — S82455A Nondisplaced comminuted fracture of shaft of left fibula, initial encounter for closed fracture: Secondary | ICD-10-CM | POA: Diagnosis not present

## 2023-03-20 DIAGNOSIS — M7989 Other specified soft tissue disorders: Secondary | ICD-10-CM | POA: Diagnosis not present

## 2023-03-20 DIAGNOSIS — M79672 Pain in left foot: Secondary | ICD-10-CM | POA: Diagnosis not present

## 2023-03-20 DIAGNOSIS — X501XXA Overexertion from prolonged static or awkward postures, initial encounter: Secondary | ICD-10-CM | POA: Insufficient documentation

## 2023-03-20 DIAGNOSIS — S82832A Other fracture of upper and lower end of left fibula, initial encounter for closed fracture: Secondary | ICD-10-CM | POA: Insufficient documentation

## 2023-03-20 DIAGNOSIS — M7732 Calcaneal spur, left foot: Secondary | ICD-10-CM | POA: Diagnosis not present

## 2023-03-20 MED ORDER — IBUPROFEN 400 MG PO TABS
600.0000 mg | ORAL_TABLET | Freq: Once | ORAL | Status: AC
  Administered 2023-03-20: 600 mg via ORAL
  Filled 2023-03-20: qty 1

## 2023-03-20 NOTE — ED Triage Notes (Addendum)
Patient arrives with complaints of sustaining a left ankle injury while getting out of a truck. Rates pain a 8/10.

## 2023-03-20 NOTE — ED Provider Notes (Signed)
EMERGENCY DEPARTMENT AT [facility] Provider Note   CSN: [id number] Arrival date & time: 03/20/23  1256     History  Chief Complaint  Patient presents with   Ankle Injury    left    [name] is a 52 y.o. female.  Patient presents to the emergency department today for evaluation of acute onset left ankle pain.  Patient was jumping out of a truck just prior to arrival when she rolled over on her ankle.  She felt a crack.  She had immediate pain.  She is unable to bear weight.  No knee or hip pain.  No distal numbness or tingling.  No treatments prior to arrival.  Patient has had additional worsening swelling.       Home Medications Prior to Admission medications   Medication Sig Start Date End Date Taking? Authorizing Provider  tamoxifen (NOLVADEX) 20 MG tablet Take 1 tablet (20 mg total) by mouth daily. 12/31/22   Jeralyn Ruths, MD  albuterol (VENTOLIN HFA) 108 (90 Base) MCG/ACT inhaler Inhale 1 puff into the lungs every 4 (four) hours as needed for shortness of breath and wheezing 03/12/23     citalopram (CELEXA) 10 MG tablet Take 1 tablet (10 mg total) by mouth daily. 12/23/22     folic acid (FOLVITE) 1 MG tablet Take 1 tablet (1 mg total) by mouth daily. 11/10/22     folic acid (FOLVITE) 1 MG tablet Take 1 tablet (1 mg total) by mouth daily. 03/12/23     gabapentin (NEURONTIN) 300 MG capsule Take 1 capsule (300 mg total) by mouth at bedtime. 01/26/23     hydroxychloroquine (PLAQUENIL) 200 MG tablet Take 1 tablet (200 mg total) by mouth 2 (two) times daily. 10/27/22     methotrexate (RHEUMATREX) 2.5 MG tablet Take 8 tablets (20 mg total) by mouth every 7 (seven) days. 11/10/22     montelukast (SINGULAIR) 10 MG tablet Take 1 tablet (10 mg total) by mouth daily. 12/01/22     pantoprazole (PROTONIX) 40 MG tablet Take 1 tablet (40 mg total) by mouth 2 (two) times daily. 12/22/22     sertraline (ZOLOFT) 100 MG tablet Take 1 tablet (100 mg total) by mouth  daily. 09/22/22     traZODone (DESYREL) 50 MG tablet Take 1 tablet (50 mg total) by mouth at bedtime as needed. 03/12/23         Allergies    Kiwi extract, Ativan [lorazepam], Erythromycin base, Nitrofurantoin, Other, and Xanax [alprazolam]    Review of Systems   Review of Systems  Physical Exam Updated Vital Signs BP (!) 103/57 (BP Location: Right Arm)   Pulse 87   Temp 98.3 F (36.8 C) (Oral)   Resp 20   Ht 5\' 3"  (1.6 m)   Wt 74.8 kg   SpO2 100%   BMI 29.23 kg/m  Physical Exam Vitals and nursing note reviewed.  Constitutional:      Appearance: She is well-developed.  HENT:     Head: Normocephalic and atraumatic.  Eyes:     Pupils: Pupils are equal, round, and reactive to light.  Cardiovascular:     Pulses: Normal pulses. No decreased pulses.          Dorsalis pedis pulses are 2+ on the right side and 2+ on the left side.  Musculoskeletal:        General: Tenderness present.     Cervical back: Normal range of motion and neck supple.  Left knee: No bony tenderness. Normal range of motion. No tenderness.     Left ankle: Tenderness present over the lateral malleolus. No proximal fibula tenderness. Normal pulse.     Left foot: Normal range of motion. No tenderness.  Skin:    General: Skin is warm and dry.  Neurological:     Mental Status: She is alert.     Sensory: No sensory deficit.     Comments: Motor, sensation, and vascular distal to the injury is fully intact.   Psychiatric:        Mood and Affect: Mood normal.     ED Results / Procedures / Treatments   Labs (all labs ordered are listed, but only abnormal results are displayed) Labs Reviewed - No data to display  EKG None  Radiology DG Ankle Complete Left  Result Date: 03/20/2023 CLINICAL DATA:  Left ankle injury while jumping out of a truck. EXAM: LEFT ANKLE COMPLETE - 3+ VIEW COMPARISON:  None Available. FINDINGS: Transverse nondisplaced, non comminuted fracture of the distal fibula, 6 mm below the  tibial plafond. No other fractures. Mild widening of the lateral aspect of the tibiotalar joint. No lateral subluxation. Small to moderate-sized plantar calcaneal spur. Anterolateral soft tissue swelling IMPRESSION: 1. Nondisplaced, non comminuted fracture the distal fibula. No dislocation. Electronically Signed   By: Amie Portland M.D.   On: 03/20/2023 13:30   DG Foot Complete Left  Result Date: 03/20/2023 CLINICAL DATA:  Left ankle/foot injury jumping out of a truck. Pain. EXAM: LEFT FOOT - COMPLETE 3+ VIEW COMPARISON:  None Available. FINDINGS: No fracture or bone lesion. Joints are normally spaced and aligned. Small plantar calcaneal spur. Soft tissues are unremarkable. IMPRESSION: No fracture or dislocation. Electronically Signed   By: Amie Portland M.D.   On: 03/20/2023 13:28    Procedures Procedures    Medications Ordered in ED Medications  ibuprofen (ADVIL) tablet 600 mg (600 mg Oral Given 03/20/23 1351)    ED Course/ Medical Decision Making/ A&P    Patient seen and examined. History obtained directly from patient. Work-up including labs, imaging, EKG ordered in triage, if performed, were reviewed.    Labs/EKG: None ordered  Imaging: Independently reviewed and interpreted.  This included: X-ray of the left ankle and foot, agree distal fibular fracture.  Medications/Fluids: Offered pain medication, patient request ibuprofen  Most recent vital signs reviewed and are as follows: BP (!) 103/57 (BP Location: Right Arm)   Pulse 87   Temp 98.3 F (36.8 C) (Oral)   Resp 20   Ht 5\' 3"  (1.6 m)   Wt 74.8 kg   SpO2 100%   BMI 29.23 kg/m   Initial impression: Left fibula fracture  Home treatment plan: Cam walker/crutches, RICE protocol  Return instructions discussed with patient: New or worsening symptoms  Follow-up instructions discussed with patient: Orthopedic referral in 5 days, referral given.                                Medical Decision Making Amount and/or  Complexity of Data Reviewed Radiology: ordered.   Patient with distal fibula fracture after inversion injury, fracture noted.  Lower extremities neurovascularly intact.  Patient will be given a cam boot and crutches.  Orthopedic referral given.        Final Clinical Impression(s) / ED Diagnoses Final diagnoses:  Other closed fracture of distal end of left fibula, initial encounter    Rx / DC  Orders ED Discharge Orders     None         Renne Crigler, Cordelia Poche 03/20/23 1421    Benjiman Core, MD 03/20/23 [phone]

## 2023-03-20 NOTE — ED Notes (Signed)
Pt verbalized understanding of d/c instructions, meds, and followup care. Denies questions. VSS, no distress noted. Steady gait to exit with all belongings.  ?

## 2023-03-20 NOTE — Discharge Instructions (Signed)
Please read and follow all provided instructions.  Your diagnoses today include:  1. Other closed fracture of distal end of left fibula, initial encounter     Tests performed today include: An x-ray of the affected area - shows broken distal fibula Vital signs. See below for your results today.   Medications prescribed:  Please use over-the-counter NSAID medications (ibuprofen, naproxen) or Tylenol (acetaminophen) as directed on the packaging for pain -- as long as you do not have any reasons avoid these medications. Reasons to avoid NSAID medications include: weak kidneys, a history of bleeding in your stomach or gut, or uncontrolled high blood pressure or previous heart attack. Reasons to avoid Tylenol include: liver problems or ongoing alcohol use. Never take more than 4000mg  or 8 Extra strength Tylenol in a 24 hour period.     Take any prescribed medications only as directed.  Home care instructions:  Follow any educational materials contained in this packet Follow R.I.C.E. Protocol: R - rest your injury  I  - use ice on injury without applying directly to skin C - compress injury with bandage or splint E - elevate the injury as much as possible  Follow-up instructions: Please follow-up with orthopedics for your fibula fracture in 5 days.  Return instructions:  Please return if your toes or feet are numb or tingling, appear gray or blue, or you have severe pain (also elevate the leg and loosen splint or wrap if you were given one) Please return to the Emergency Department if you experience worsening symptoms.  Please return if you have any other emergent concerns.  Additional Information:  Your vital signs today were: BP (!) 103/57 (BP Location: Right Arm)   Pulse 87   Temp 98.3 F (36.8 C) (Oral)   Resp 20   Ht 5\' 3"  (1.6 m)   Wt 74.8 kg   SpO2 100%   BMI 29.23 kg/m  If your blood pressure (BP) was elevated above 135/85 this visit, please have this repeated by your  doctor within one month. --------------

## 2023-03-24 ENCOUNTER — Other Ambulatory Visit (HOSPITAL_COMMUNITY): Payer: Self-pay

## 2023-03-24 DIAGNOSIS — G2581 Restless legs syndrome: Secondary | ICD-10-CM | POA: Diagnosis not present

## 2023-03-24 DIAGNOSIS — Z7960 Long term (current) use of unspecified immunomodulators and immunosuppressants: Secondary | ICD-10-CM | POA: Diagnosis not present

## 2023-03-24 DIAGNOSIS — M313 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: Secondary | ICD-10-CM | POA: Diagnosis not present

## 2023-03-24 MED ORDER — GABAPENTIN 300 MG PO CAPS
300.0000 mg | ORAL_CAPSULE | Freq: Every day | ORAL | 3 refills | Status: DC
  Filled 2023-03-24 – 2023-05-20 (×2): qty 90, 90d supply, fill #0
  Filled 2023-10-05: qty 90, 90d supply, fill #1
  Filled 2023-11-17 – 2024-01-02 (×2): qty 90, 90d supply, fill #2

## 2023-03-24 MED ORDER — METHOTREXATE SODIUM 2.5 MG PO TABS
20.0000 mg | ORAL_TABLET | ORAL | 1 refills | Status: DC
  Filled 2023-03-24 – 2023-04-23 (×2): qty 96, 84d supply, fill #0
  Filled 2023-05-20 – 2023-08-02 (×2): qty 96, 84d supply, fill #1

## 2023-03-24 MED ORDER — HYDROXYCHLOROQUINE SULFATE 200 MG PO TABS
200.0000 mg | ORAL_TABLET | Freq: Two times a day (BID) | ORAL | 1 refills | Status: DC
  Filled 2023-03-24 – 2023-05-20 (×2): qty 180, 90d supply, fill #0
  Filled 2023-06-17 – 2023-11-17 (×2): qty 180, 90d supply, fill #1

## 2023-03-25 ENCOUNTER — Inpatient Hospital Stay: Payer: 59 | Attending: Oncology | Admitting: Oncology

## 2023-03-25 DIAGNOSIS — S82425A Nondisplaced transverse fracture of shaft of left fibula, initial encounter for closed fracture: Secondary | ICD-10-CM | POA: Diagnosis not present

## 2023-03-25 DIAGNOSIS — D0512 Intraductal carcinoma in situ of left breast: Secondary | ICD-10-CM | POA: Diagnosis not present

## 2023-03-25 NOTE — Progress Notes (Signed)
Patient would like for her next visit with Dr. Irving Copas to be in person/in-office. Also, she said she is unable to tolerate the tamoxifen last prescribed by Dr. Irving Copas and would like to discuss getting away from all after chemo treatment medications. She did inform me that she did brake her ankle about a week or so ago, and isn't allowed to put pressure on it for 3 weeks.

## 2023-03-25 NOTE — Progress Notes (Signed)
[facility]  [phone] [phone]) [phone]  ID: LOLENE SHAMS OB: 24-Jan-1971  MR#: [id number]  GLO#:[id number]  Patient Care Team: [name] Bowl, MD as PCP - General (Internal Medicine)  I connected with [name] on 03/25/23 at  3:30 PM EDT by video enabled telemedicine visit and verified that I am speaking with the correct person using two identifiers.   I discussed the limitations, risks, security and privacy concerns of performing an evaluation and management service by telemedicine and the availability of in-person appointments. I also discussed with the patient that there may be a patient responsible charge related to this service. The patient expressed understanding and agreed to proceed.   Other persons participating in the visit and their role in the encounter: Patient, MD.  Patient's location: Home. Provider's location: Clinic.  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  INTERVAL HISTORY: Patient agreed to video assisted telemedicine visit for routine 50-month evaluation.  Patient continued to have significant joint pain with tamoxifen and this has now been discontinued as well.  Previously, she could not tolerate anastrozole or letrozole.  She recently broke her ankle, but otherwise feels well and is asymptomatic.  She has no neurologic complaints.  She denies any recent fevers or illnesses.  She has a good appetite and denies weight loss.  She has no chest pain, shortness of breath, cough, or hemoptysis.  She denies any nausea, vomiting, constipation, or diarrhea.  She has no urinary complaints.  Patient offers no further specific complaints today.  REVIEW OF SYSTEMS:   Review of Systems  Constitutional: Negative.  Negative for fever, malaise/fatigue and weight loss.  Respiratory: Negative.  Negative for cough, hemoptysis and shortness of breath.   Cardiovascular:  Negative for chest pain and  leg swelling.  Gastrointestinal: Negative.  Negative for abdominal pain.  Genitourinary: Negative.  Negative for dysuria.  Musculoskeletal: Negative.  Negative for back pain.  Skin: Negative.  Negative for rash.  Neurological: Negative.  Negative for dizziness, focal weakness, weakness and headaches.  Psychiatric/Behavioral: Negative.  The patient is not nervous/anxious.     As per HPI. Otherwise, a complete review of systems is negative.  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)    [name]---  dx 2015,  takes methotrexate wkly, pt stated she is stable    PAST SURGICAL HISTORY: Past Surgical History:  Procedure Laterality Date   ANTERIOR AND POSTERIOR REPAIR N/A 01/14/2021   Procedure: ANTERIOR (CYSTOCELE) AND POSTERIOR REPAIR (RECTOCELE);  Surgeon: Lavina Hamman, MD;  [facility]  Service: Gynecology;  Laterality: N/A;   AUGMENTATION MAMMAPLASTY Bilateral 2010   saline   BREAST BIOPSY Left 10/23/2021   Stereo Bx, X-clip, Ductal carcinoma in situ   BREAST CYST EXCISION Left 03/25/2022   Procedure: EXCISION OF CHRONIC LEFT BREAST WOUND;  Surgeon: Almond Lint, MD;  Location:  [facility];  Service: General;  Laterality: Left;   BREAST ENHANCEMENT SURGERY  2009   BREAST LUMPECTOMY     BREAST LUMPECTOMY WITH RADIOACTIVE SEED AND SENTINEL LYMPH NODE BIOPSY Left 11/12/2021   Procedure: LEFT BREAST  LUMPECTOMY WITH RADIOACTIVE SEED AND SENTINEL LYMPH NODE BIOPSY;  Surgeon: Almond Lint, MD;  Location:  SURGERY CENTER;  Service: General;  Laterality: Left;   CARPAL TUNNEL RELEASE Right 2011   LAPAROSCOPIC SUPRACERVICAL HYSTERECTOMY  02/21/2010   @ [facility] by Dr Jackelyn Knife   NASAL SEPTUM SURGERY  01/2018   insertion button prosthesis for septal perforation   RHINOPLASTY  2005   nasal fracture    FAMILY HISTORY: Family History  Problem Relation Age of Onset   Asthma Mother    Depression Mother    Diabetes Mother    Hyperlipidemia Mother    Hypertension Mother    Mental illness Mother    Arthritis Mother    Pancreatic cancer Mother    Heart disease Father 28       AMI/CABG   Mental illness Sister    Myasthenia gravis Daughter    Charcot-Marie-Tooth disease Daughter    ADD / ADHD Daughter    Pancreatic cancer Maternal Aunt    Alzheimer's disease Maternal Grandmother    Hyperlipidemia Maternal Grandmother    Hypertension Maternal Grandmother    Heart attack Maternal Grandfather    Lung cancer Maternal Grandfather    Breast cancer Paternal Grandmother     ADVANCED DIRECTIVES (Y/N):  N  HEALTH MAINTENANCE: Social History   Tobacco Use   Smoking status: Never   Smokeless tobacco: Never  Vaping Use   Vaping status: Never Used  Substance Use Topics   Alcohol use: Yes    Comment: occasional   Drug use: Never     Colonoscopy:  PAP:  Bone density:  Lipid panel:  Allergies  Allergen Reactions   Kiwi Extract Shortness Of Breath and Swelling   Ativan [Lorazepam] Other (See Comments)    Hyperactivity per patient   Erythromycin Base Nausea And Vomiting   Nitrofurantoin Nausea And Vomiting   Other     Latex self dissolving sutures "work their way out"   Xanax [Alprazolam] Other (See Comments)    Makes patient hyperactive     Current Outpatient Medications  Medication Sig Dispense Refill   albuterol (VENTOLIN HFA) 108 (90 Base) MCG/ACT inhaler Inhale 1  puff into the lungs every 4 (four) hours as needed for shortness of breath and wheezing 6.7 g 1   citalopram (CELEXA) 10 MG tablet Take 1 tablet (10 mg total) by mouth daily. 60 tablet 1   folic acid (FOLVITE) 1 MG tablet Take 1 tablet (1 mg total) by mouth daily. 90 tablet 3   folic acid (FOLVITE) 1 MG tablet Take 1 tablet (1 mg total) by mouth daily. 90 tablet 1   gabapentin (NEURONTIN) 300 MG capsule Take 1 capsule (300 mg total) by mouth at bedtime. 90 capsule 3   gabapentin (NEURONTIN) 300 MG capsule Take 1 capsule (300 mg total) by mouth at bedtime. 90 capsule 3   hydroxychloroquine (PLAQUENIL) 200 MG tablet Take 1 tablet (200 mg total) by mouth 2 (two) times daily. 60 tablet 5   hydroxychloroquine (PLAQUENIL) 200 MG tablet Take 1 tablet (200 mg total) by mouth 2 (two) times daily. 180 tablet 1   methotrexate (RHEUMATREX) 2.5 MG tablet Take 8 tablets (20 mg total) by mouth every 7 (seven) days. 96 tablet 1  methotrexate (RHEUMATREX) 2.5 MG tablet Take 8 tablets (20 mg total) by mouth every 7 (seven) days. 96 tablet 1   montelukast (SINGULAIR) 10 MG tablet Take 1 tablet (10 mg total) by mouth daily. 30 tablet 6   pantoprazole (PROTONIX) 40 MG tablet Take 1 tablet (40 mg total) by mouth 2 (two) times daily. 180 tablet 1   sertraline (ZOLOFT) 100 MG tablet Take 1 tablet (100 mg total) by mouth daily. 90 tablet 2   tamoxifen (NOLVADEX) 20 MG tablet Take 1 tablet (20 mg total) by mouth daily. 90 tablet 0   traZODone (DESYREL) 50 MG tablet Take 1 tablet (50 mg total) by mouth at bedtime as needed. 30 tablet 0   No current facility-administered medications for this visit.    OBJECTIVE: There were no vitals filed for this visit.     There is no height or weight on file to calculate BMI.    ECOG FS:0 - Asymptomatic  General: Well-developed, well-nourished, no acute distress. HEENT: Normocephalic. Neuro: Alert, answering all questions appropriately. Cranial nerves grossly intact. Psych:  Normal affect.   LAB RESULTS:  Lab Results  Component Value Date   NA 139 11/03/2022   K 4.4 11/03/2022   CL 105 11/03/2022   CO2 26 11/03/2022   GLUCOSE 99 11/03/2022   BUN 19 11/03/2022   CREATININE 0.66 11/03/2022   CALCIUM 9.6 11/03/2022   PROT 7.3 11/03/2022   ALBUMIN 4.1 11/03/2022   AST 21 11/03/2022   ALT 11 11/03/2022   ALKPHOS 64 11/03/2022   BILITOT 1.0 11/03/2022   GFRNONAA >60 11/03/2022   GFRAA 121 06/10/2016    Lab Results  Component Value Date   WBC 3.9 (L) 11/03/2022   NEUTROABS 2.2 11/03/2022   HGB 11.7 (L) 11/03/2022   HCT 36.0 11/03/2022   MCV 88.9 11/03/2022   PLT 241 11/03/2022     STUDIES: DG Ankle Complete Left  Result Date: 03/20/2023 CLINICAL DATA:  Left ankle injury while jumping out of a truck. EXAM: LEFT ANKLE COMPLETE - 3+ VIEW COMPARISON:  None Available. FINDINGS: Transverse nondisplaced, non comminuted fracture of the distal fibula, 6 mm below the tibial plafond. No other fractures. Mild widening of the lateral aspect of the tibiotalar joint. No lateral subluxation. Small to moderate-sized plantar calcaneal spur. Anterolateral soft tissue swelling IMPRESSION: 1. Nondisplaced, non comminuted fracture the distal fibula. No dislocation. Electronically Signed   By: Amie Portland M.D.   On: 03/20/2023 13:30   DG Foot Complete Left  Result Date: 03/20/2023 CLINICAL DATA:  Left ankle/foot injury jumping out of a truck. Pain. EXAM: LEFT FOOT - COMPLETE 3+ VIEW COMPARISON:  None Available. FINDINGS: No fracture or bone lesion. Joints are normally spaced and aligned. Small plantar calcaneal spur. Soft tissues are unremarkable. IMPRESSION: No fracture or dislocation. Electronically Signed   By: Amie Portland M.D.   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  Patient underwent lumpectomy on Nov 12, 2021.   She has had multiple postsurgical complications with wound infection requiring multiple procedures including hyperbaric treatment.  Patient attributes much of her ongoing issues to XRT.  Patient could not tolerate letrozole, anastrozole, or tamoxifen secondary to significant joint pain therefore all treatment has been discontinued.  Patient expressed understanding this slightly increases her risk of recurrence.  No further intervention is needed at this time.  Patient's most recent mammogram on October 08, 2022 was reported as BI-RADS 1.  Repeat mammogram with in person follow-up in April 2025 at which point patient can likely be transitioned to yearly visits. Genetics: Patient was tested in January 2022 and this was reported as negative.   Bone health: Bone mineral density on June 23, 2022 was reported as normal.  Repeat in December 2025.  Follow-up as above.  These can likely be monitored by primary care now the patient has discontinued treatment. Breast wound: Patient has completed hyperbaric treatment.  Continue follow-up with plastic surgery as indicated. Fractured ankle: Patient reports she does not require surgery.  Continue follow-up with orthopedics as needed.  I provided 20 minutes of face-to-face video visit time during this encounter which included chart review, counseling, and coordination of care as documented above.        Patient expressed understanding and was in agreement with this plan. She also understands that She can call clinic at any time with any questions, concerns, or complaints.    Cancer Staging  Ductal carcinoma in situ (DCIS) of left breast Staging form: Breast, AJCC 8th Edition - Clinical: Stage IA (cT12mi, cN0, cM0, G1, ER+, PR+, HER2-) - Signed by Jeralyn Ruths, MD on 11/28/2021 Histologic grading system: 3 grade system   Jeralyn Ruths, MD   03/25/2023 4:16 PM

## 2023-04-02 ENCOUNTER — Ambulatory Visit: Payer: 59

## 2023-04-05 ENCOUNTER — Other Ambulatory Visit (HOSPITAL_COMMUNITY): Payer: Self-pay

## 2023-04-08 DIAGNOSIS — S82425A Nondisplaced transverse fracture of shaft of left fibula, initial encounter for closed fracture: Secondary | ICD-10-CM | POA: Diagnosis not present

## 2023-04-12 ENCOUNTER — Other Ambulatory Visit: Payer: Self-pay | Admitting: Oncology

## 2023-04-12 DIAGNOSIS — D0512 Intraductal carcinoma in situ of left breast: Secondary | ICD-10-CM

## 2023-04-16 ENCOUNTER — Ambulatory Visit: Payer: 59 | Attending: General Surgery | Admitting: Rehabilitation

## 2023-04-16 DIAGNOSIS — Z170 Estrogen receptor positive status [ER+]: Secondary | ICD-10-CM | POA: Insufficient documentation

## 2023-04-16 DIAGNOSIS — C50912 Malignant neoplasm of unspecified site of left female breast: Secondary | ICD-10-CM | POA: Insufficient documentation

## 2023-04-16 DIAGNOSIS — Z483 Aftercare following surgery for neoplasm: Secondary | ICD-10-CM | POA: Insufficient documentation

## 2023-04-23 ENCOUNTER — Other Ambulatory Visit (HOSPITAL_COMMUNITY): Payer: Self-pay

## 2023-05-04 ENCOUNTER — Other Ambulatory Visit: Payer: Self-pay

## 2023-05-04 ENCOUNTER — Telehealth: Payer: 59 | Admitting: Physician Assistant

## 2023-05-04 DIAGNOSIS — L255 Unspecified contact dermatitis due to plants, except food: Secondary | ICD-10-CM | POA: Diagnosis not present

## 2023-05-04 MED ORDER — PREDNISONE 10 MG PO TABS
ORAL_TABLET | ORAL | 0 refills | Status: AC
Start: 2023-05-04 — End: 2023-05-10
  Filled 2023-05-04: qty 21, 6d supply, fill #0

## 2023-05-04 NOTE — Progress Notes (Signed)
E-Visit for Poison Ivy  We are sorry that you are not feeing well.  Here is how we plan to help!  Based on what you have shared with me it looks like you have had an allergic reaction to the oily resin from a group of plants.  This resin is very sticky, so it easily attaches to your skin, clothing, tools equipment, and pet's fur.    This blistering rash is often called poison ivy rash although it can come from contact with the leaves, stems and roots of poison ivy, poison oak and poison sumac.  The oily resin contains urushiol (u-ROO-she-ol) that produces a skin rash on exposed skin.  The severity of the rash depends on the amount of urushiol that gets on your skin.  A section of skin with more urushiol on it may develop a rash sooner.  The rash usually develops 12-48 hours after exposure and can last two to three weeks.  Your skin must come in direct contact with the plant's oil to be affected.  Blister fluid doesn't spread the rash.  However, if you come into contact with a piece of clothing or pet fur that has urushiol on it, the rash may spread out.  You can also transfer the oil to other parts of your body with your fingers.  Often the rash looks like a straight line because of the way the plant brushes against your skin.  Since your rash is widespread or has resulted in a large number of blisters, I have prescribed an oral corticosteroid.  Please follow these recommendations:  I have sent a prednisone dose pack to your chosen pharmacy. Be sure to follow the instructions carefully and complete the entire prescription. You may use Benadryl or Caladryl topical lotions to sooth the itch and remember cool, not hot, showers and baths can help relieve the itching!  Place cool, wet compresses on the affected area for 15-30 minutes several times a day.  You may also take oral antihistamines, such as diphenhydramine (Benadryl, others), which may also help you sleep better.  Watch your skin for any purulent  (pus) drainage or red streaking from the site.  If this occurs, contact your provider.  You may require an antibiotic for a skin infection.  Make sure that the clothes you were wearing as well as any towels or sheets that may have come in contact with the oil (urushiol) are washed in detergent and hot water.       I have developed the following plan to treat your condition  I am prescribing a 6 day prednisone taper.  Day 1: Take 2 tablets with breakfast, 1 tablet with lunch, 1 tablet with supper, and 2 tablets at bedtime Day 2: Take 2 tablets with breakfast, 1 tablet with lunch, 1 tablet with supper, and 1 tablet at bedtime Day 3: Take 1 tablet at breakfast, 1 tablet with lunch, 1 tablet with supper, and 1 tablet at bedtime Day 4: Take 1 tablet with breakfast, 1 tablet with supper and 1 tablet at bedtime Day 5: Take 1 tablet with breakfast and 1 tablet with supper or bedtime Day 6: Take 1 tablet with breakfast    What can you do to prevent this rash?  Avoid the plants.  Learn how to identify poison ivy, poison oak and poison sumac in all seasons.  When hiking or engaging in other activities that might expose you to these plants, try to stay on cleared pathways.  If camping, make sure   you pitch your tent in an area free of these plants.  Keep pets from running through wooded areas so that urushiol doesn't accidentally stick to their fur, which you may touch.  Remove or kill the plants.  In your yard, you can get rid of poison ivy by applying an herbicide or pulling it out of the ground, including the roots, while wearing heavy gloves.  Afterward remove the gloves and thoroughly wash them and your hands.  Don't burn poison ivy or related plants because the urushiol can be carried by smoke.  Wear protective clothing.  If needed, protect your skin by wearing socks, boots, pants, long sleeves and vinyl gloves.  Wash your skin right away.  Washing off the oil with soap and water within 30 minutes of  exposure may reduce your chances of getting a poison ivy rash.  Even washing after an hour or so can help reduce the severity of the rash.  If you walk through some poison ivy and then later touch your shoes, you may get some urushiol on your hands, which may then transfer to your face or body by touching or rubbing.  If the contaminated object isn't cleaned, the urushiol on it can still cause a skin reaction years later.    Be careful not to reuse towels after you have washed your skin.  Also carefully wash clothing in detergent and hot water to remove all traces of the oil.  Handle contaminated clothing carefully so you don't transfer the urushiol to yourself, furniture, rugs or appliances.  Remember that pets can carry the oil on their fur and paws.  If you think your pet may be contaminated with urushiol, put on some long rubber gloves and give your pet a bath.  Finally, be careful not to burn these plants as the smoke can contain traces of the oil.  Inhaling the smoke may result in difficulty breathing. If that occurred you should see a physician as soon as possible.  See your doctor right away if:  The reaction is severe or widespread You inhaled the smoke from burning poison ivy and are having difficulty breathing Your skin continues to swell The rash affects your eyes, mouth or genitals Blisters are oozing pus You develop a fever greater than 100 F (37.8 C) The rash doesn't get better within a few weeks.  If you scratch the poison ivy rash, bacteria under your fingernails may cause the skin to become infected.  See your doctor if pus starts oozing from the blisters.  Treatment generally includes antibiotics.  Poison ivy treatments are usually limited to self-care methods.  And the rash typically goes away on its own in two to three weeks.     If the rash is widespread or results in a large number of blisters, your doctor may prescribe an oral corticosteroid, such as prednisone.  If  a bacterial infection has developed at the rash site, your doctor may give you a prescription for an oral antibiotic.  MAKE SURE YOU  Understand these instructions. Will watch your condition. Will get help right away if you are not doing well or get worse.   Thank you for choosing an e-visit.  Your e-visit answers were reviewed by a board certified advanced clinical practitioner to complete your personal care plan. Depending upon the condition, your plan could have included both over the counter or prescription medications.  Please review your pharmacy choice. Make sure the pharmacy is open so you can pick up prescription   now. If there is a problem, you may contact your provider through MyChart messaging and have the prescription routed to another pharmacy.  Your safety is important to us. If you have drug allergies check your prescription carefully.   For the next 24 hours you can use MyChart to ask questions about today's visit, request a non-urgent call back, or ask for a work or school excuse. You will get an email in the next two days asking about your experience. I hope that your e-visit has been valuable and will speed your recovery.    I have spent 5 minutes in review of e-visit questionnaire, review and updating patient chart, medical decision making and response to patient.   Jizel Cheeks M Armond Cuthrell, PA-C  

## 2023-05-20 ENCOUNTER — Other Ambulatory Visit (HOSPITAL_COMMUNITY): Payer: Self-pay

## 2023-05-21 ENCOUNTER — Other Ambulatory Visit: Payer: Self-pay

## 2023-05-21 ENCOUNTER — Other Ambulatory Visit (HOSPITAL_COMMUNITY): Payer: Self-pay

## 2023-05-21 MED ORDER — TRAZODONE HCL 50 MG PO TABS
50.0000 mg | ORAL_TABLET | Freq: Every evening | ORAL | 3 refills | Status: DC | PRN
  Filled 2023-05-21: qty 30, 30d supply, fill #0
  Filled 2023-06-17 – 2023-06-29 (×2): qty 30, 30d supply, fill #1
  Filled 2023-09-07: qty 30, 30d supply, fill #2
  Filled 2023-09-13: qty 30, 30d supply, fill #0
  Filled 2023-10-05 – 2023-10-07 (×2): qty 30, 30d supply, fill #1

## 2023-06-17 ENCOUNTER — Telehealth: Payer: 59 | Admitting: Physician Assistant

## 2023-06-17 ENCOUNTER — Other Ambulatory Visit: Payer: Self-pay

## 2023-06-17 DIAGNOSIS — R1110 Vomiting, unspecified: Secondary | ICD-10-CM

## 2023-06-17 DIAGNOSIS — R42 Dizziness and giddiness: Secondary | ICD-10-CM

## 2023-06-17 NOTE — Progress Notes (Signed)
Because of vomiting and dizziness along with this, I feel your condition warrants further evaluation and I recommend that you be seen in a face to face visit.   NOTE: There will be NO CHARGE for this eVisit   If you are having a true medical emergency please call 911.      For an urgent face to face visit, [place] has eight urgent care centers for your convenience:   NEW!! [facility] at [facility] Get Driving Directions [phone] [address] [place], [address]    [facility] Health Urgent Care Center at [facility] Get Driving Directions [phone] [address] [place], [place] [address]   [facility] Health Urgent Care Center [facility]) Get Driving Directions [phone] [address] [place], [place] [address]  [facility] Health Urgent Care Center [facility]) Get Driving Directions [phone] [address] [place],  [place]  [address]  [facility] Health Urgent Care Center [facility] - at [employer]  [phone] [address] [place],  [place] [address]   [facility] Health Urgent Care at [facility] Get Driving Directions [phone] [address] [place], [place] [address]   [facility] Health Urgent Care at [facility] Get Driving Directions  [phone] [address] [place], [place] [address]   [facility] Health Urgent Care at [facility] Directions [phone] [address] [place], [place] [address]  Your MyChart E-visit questionnaire answers were reviewed by a board certified advanced clinical practitioner to complete your personal care plan based on your specific symptoms.  Thank you for using e-Visits.

## 2023-06-22 DIAGNOSIS — J4520 Mild intermittent asthma, uncomplicated: Secondary | ICD-10-CM | POA: Diagnosis not present

## 2023-06-22 DIAGNOSIS — E7800 Pure hypercholesterolemia, unspecified: Secondary | ICD-10-CM | POA: Diagnosis not present

## 2023-06-22 DIAGNOSIS — Z6830 Body mass index (BMI) 30.0-30.9, adult: Secondary | ICD-10-CM | POA: Diagnosis not present

## 2023-06-22 DIAGNOSIS — D509 Iron deficiency anemia, unspecified: Secondary | ICD-10-CM | POA: Diagnosis not present

## 2023-06-22 DIAGNOSIS — R011 Cardiac murmur, unspecified: Secondary | ICD-10-CM | POA: Diagnosis not present

## 2023-06-22 DIAGNOSIS — E88819 Insulin resistance, unspecified: Secondary | ICD-10-CM | POA: Diagnosis not present

## 2023-06-22 DIAGNOSIS — Z1331 Encounter for screening for depression: Secondary | ICD-10-CM | POA: Diagnosis not present

## 2023-06-22 DIAGNOSIS — E66811 Obesity, class 1: Secondary | ICD-10-CM | POA: Diagnosis not present

## 2023-06-25 ENCOUNTER — Other Ambulatory Visit: Payer: Self-pay

## 2023-06-29 ENCOUNTER — Other Ambulatory Visit (HOSPITAL_COMMUNITY): Payer: Self-pay

## 2023-06-29 ENCOUNTER — Other Ambulatory Visit: Payer: Self-pay

## 2023-07-08 DIAGNOSIS — Z6830 Body mass index (BMI) 30.0-30.9, adult: Secondary | ICD-10-CM | POA: Diagnosis not present

## 2023-07-08 DIAGNOSIS — R7303 Prediabetes: Secondary | ICD-10-CM | POA: Diagnosis not present

## 2023-07-08 DIAGNOSIS — Z1389 Encounter for screening for other disorder: Secondary | ICD-10-CM | POA: Diagnosis not present

## 2023-07-08 DIAGNOSIS — E7800 Pure hypercholesterolemia, unspecified: Secondary | ICD-10-CM | POA: Diagnosis not present

## 2023-07-08 DIAGNOSIS — E66811 Obesity, class 1: Secondary | ICD-10-CM | POA: Diagnosis not present

## 2023-07-08 DIAGNOSIS — Z6831 Body mass index (BMI) 31.0-31.9, adult: Secondary | ICD-10-CM | POA: Diagnosis not present

## 2023-07-08 DIAGNOSIS — D509 Iron deficiency anemia, unspecified: Secondary | ICD-10-CM | POA: Diagnosis not present

## 2023-07-08 DIAGNOSIS — Z01419 Encounter for gynecological examination (general) (routine) without abnormal findings: Secondary | ICD-10-CM | POA: Diagnosis not present

## 2023-07-16 DIAGNOSIS — Z01812 Encounter for preprocedural laboratory examination: Secondary | ICD-10-CM | POA: Diagnosis not present

## 2023-07-22 ENCOUNTER — Other Ambulatory Visit (HOSPITAL_COMMUNITY): Payer: Self-pay

## 2023-07-22 DIAGNOSIS — D509 Iron deficiency anemia, unspecified: Secondary | ICD-10-CM | POA: Diagnosis not present

## 2023-07-22 DIAGNOSIS — E669 Obesity, unspecified: Secondary | ICD-10-CM | POA: Diagnosis not present

## 2023-07-22 DIAGNOSIS — E7800 Pure hypercholesterolemia, unspecified: Secondary | ICD-10-CM | POA: Diagnosis not present

## 2023-07-22 DIAGNOSIS — R7303 Prediabetes: Secondary | ICD-10-CM | POA: Diagnosis not present

## 2023-07-22 DIAGNOSIS — E66811 Obesity, class 1: Secondary | ICD-10-CM | POA: Diagnosis not present

## 2023-07-22 DIAGNOSIS — Z6830 Body mass index (BMI) 30.0-30.9, adult: Secondary | ICD-10-CM | POA: Diagnosis not present

## 2023-07-22 MED ORDER — FUSION PLUS PO CAPS
1.0000 | ORAL_CAPSULE | Freq: Every day | ORAL | 0 refills | Status: DC
Start: 2023-07-22 — End: 2024-05-09
  Filled 2023-07-22: qty 90, 90d supply, fill #0

## 2023-07-23 ENCOUNTER — Other Ambulatory Visit (HOSPITAL_COMMUNITY): Payer: Self-pay

## 2023-07-26 ENCOUNTER — Other Ambulatory Visit (HOSPITAL_COMMUNITY): Payer: Self-pay

## 2023-08-02 ENCOUNTER — Other Ambulatory Visit (HOSPITAL_COMMUNITY): Payer: Self-pay

## 2023-08-02 ENCOUNTER — Other Ambulatory Visit: Payer: Self-pay

## 2023-08-05 ENCOUNTER — Other Ambulatory Visit (HOSPITAL_COMMUNITY): Payer: Self-pay

## 2023-08-05 MED ORDER — ONDANSETRON 4 MG PO TBDP
4.0000 mg | ORAL_TABLET | Freq: Four times a day (QID) | ORAL | 0 refills | Status: DC | PRN
Start: 2023-08-05 — End: 2024-05-09
  Filled 2023-08-05: qty 8, 2d supply, fill #0

## 2023-08-05 MED ORDER — TRAMADOL HCL 50 MG PO TABS
50.0000 mg | ORAL_TABLET | Freq: Four times a day (QID) | ORAL | 0 refills | Status: DC | PRN
Start: 2023-08-05 — End: 2024-05-09
  Filled 2023-08-05: qty 16, 4d supply, fill #0

## 2023-08-18 ENCOUNTER — Other Ambulatory Visit: Payer: Self-pay | Admitting: Plastic Surgery

## 2023-08-18 DIAGNOSIS — Z8639 Personal history of other endocrine, nutritional and metabolic disease: Secondary | ICD-10-CM | POA: Diagnosis not present

## 2023-08-18 DIAGNOSIS — R7303 Prediabetes: Secondary | ICD-10-CM | POA: Diagnosis not present

## 2023-08-18 DIAGNOSIS — Z6829 Body mass index (BMI) 29.0-29.9, adult: Secondary | ICD-10-CM | POA: Diagnosis not present

## 2023-08-18 DIAGNOSIS — E663 Overweight: Secondary | ICD-10-CM | POA: Diagnosis not present

## 2023-08-18 DIAGNOSIS — N651 Disproportion of reconstructed breast: Secondary | ICD-10-CM | POA: Diagnosis not present

## 2023-08-18 DIAGNOSIS — Z421 Encounter for breast reconstruction following mastectomy: Secondary | ICD-10-CM | POA: Diagnosis not present

## 2023-08-18 DIAGNOSIS — Z853 Personal history of malignant neoplasm of breast: Secondary | ICD-10-CM | POA: Diagnosis not present

## 2023-08-18 DIAGNOSIS — Z411 Encounter for cosmetic surgery: Secondary | ICD-10-CM | POA: Diagnosis not present

## 2023-08-18 DIAGNOSIS — D509 Iron deficiency anemia, unspecified: Secondary | ICD-10-CM | POA: Diagnosis not present

## 2023-08-18 DIAGNOSIS — E7800 Pure hypercholesterolemia, unspecified: Secondary | ICD-10-CM | POA: Diagnosis not present

## 2023-08-20 LAB — SURGICAL PATHOLOGY

## 2023-09-07 ENCOUNTER — Other Ambulatory Visit (HOSPITAL_COMMUNITY): Payer: Self-pay

## 2023-09-09 ENCOUNTER — Other Ambulatory Visit (HOSPITAL_COMMUNITY): Payer: Self-pay

## 2023-09-10 ENCOUNTER — Other Ambulatory Visit: Payer: Self-pay

## 2023-09-10 ENCOUNTER — Other Ambulatory Visit (HOSPITAL_COMMUNITY): Payer: Self-pay

## 2023-09-10 MED ORDER — METHOTREXATE SODIUM 2.5 MG PO TABS
20.0000 mg | ORAL_TABLET | ORAL | 1 refills | Status: AC
  Filled 2023-11-17: qty 96, 84d supply, fill #0
  Filled 2024-01-02 – 2024-03-27 (×2): qty 96, 84d supply, fill #1

## 2023-09-10 MED ORDER — MONTELUKAST SODIUM 10 MG PO TABS
10.0000 mg | ORAL_TABLET | Freq: Every day | ORAL | 6 refills | Status: AC
  Filled 2023-10-05 – 2023-11-17 (×2): qty 30, 30d supply, fill #0
  Filled 2024-01-02: qty 30, 30d supply, fill #1
  Filled 2024-01-30: qty 30, 30d supply, fill #2

## 2023-09-10 MED ORDER — FOLIC ACID 1 MG PO TABS
1.0000 mg | ORAL_TABLET | Freq: Every day | ORAL | 3 refills | Status: AC
  Filled 2023-09-10 – 2023-10-05 (×2): qty 90, 90d supply, fill #0
  Filled 2024-05-01 – 2024-06-04 (×2): qty 90, 90d supply, fill #1

## 2023-09-10 MED ORDER — SERTRALINE HCL 100 MG PO TABS
100.0000 mg | ORAL_TABLET | Freq: Every day | ORAL | 2 refills | Status: AC
  Filled 2023-10-05 – 2023-11-17 (×2): qty 90, 90d supply, fill #0
  Filled 2024-03-01: qty 90, 90d supply, fill #1
  Filled 2024-03-27 – 2024-06-04 (×3): qty 90, 90d supply, fill #2

## 2023-09-10 MED ORDER — PANTOPRAZOLE SODIUM 40 MG PO TBEC
40.0000 mg | DELAYED_RELEASE_TABLET | Freq: Two times a day (BID) | ORAL | 1 refills | Status: DC
Start: 2023-09-10 — End: 2024-05-09
  Filled 2023-09-10 – 2023-10-05 (×2): qty 180, 90d supply, fill #0

## 2023-09-13 ENCOUNTER — Other Ambulatory Visit: Payer: Self-pay

## 2023-09-13 ENCOUNTER — Other Ambulatory Visit (HOSPITAL_COMMUNITY): Payer: Self-pay

## 2023-09-13 ENCOUNTER — Encounter (HOSPITAL_COMMUNITY): Payer: Self-pay

## 2023-09-22 ENCOUNTER — Other Ambulatory Visit (HOSPITAL_COMMUNITY): Payer: Self-pay

## 2023-10-06 ENCOUNTER — Other Ambulatory Visit: Payer: Self-pay

## 2023-10-07 DIAGNOSIS — H5213 Myopia, bilateral: Secondary | ICD-10-CM | POA: Diagnosis not present

## 2023-10-15 ENCOUNTER — Ambulatory Visit
Admission: RE | Admit: 2023-10-15 | Discharge: 2023-10-15 | Disposition: A | Discharge status: Home / Self Care | Payer: 59 | Source: Ambulatory Visit | Attending: Oncology | Admitting: Oncology

## 2023-10-15 ENCOUNTER — Other Ambulatory Visit: Payer: Self-pay | Admitting: Oncology

## 2023-10-15 DIAGNOSIS — C50919 Malignant neoplasm of unspecified site of unspecified female breast: Secondary | ICD-10-CM | POA: Diagnosis not present

## 2023-10-15 DIAGNOSIS — D0512 Intraductal carcinoma in situ of left breast: Secondary | ICD-10-CM

## 2023-10-15 DIAGNOSIS — R92333 Mammographic heterogeneous density, bilateral breasts: Secondary | ICD-10-CM | POA: Diagnosis not present

## 2023-10-15 HISTORY — DX: Personal history of irradiation: Z92.3

## 2023-10-15 HISTORY — DX: Malignant neoplasm of unspecified site of unspecified female breast: C50.919

## 2023-10-22 ENCOUNTER — Encounter: Payer: Self-pay | Admitting: Oncology

## 2023-10-22 ENCOUNTER — Inpatient Hospital Stay: Payer: 59 | Attending: Oncology | Admitting: Oncology

## 2023-10-22 VITALS — BP 129/81 | HR 73 | Temp 97.2°F | Resp 16 | Ht 63.0 in | Wt 167.0 lb

## 2023-10-22 DIAGNOSIS — C50919 Malignant neoplasm of unspecified site of unspecified female breast: Secondary | ICD-10-CM

## 2023-10-22 DIAGNOSIS — Z801 Family history of malignant neoplasm of trachea, bronchus and lung: Secondary | ICD-10-CM | POA: Diagnosis not present

## 2023-10-22 DIAGNOSIS — D0512 Intraductal carcinoma in situ of left breast: Secondary | ICD-10-CM

## 2023-10-22 DIAGNOSIS — Z803 Family history of malignant neoplasm of breast: Secondary | ICD-10-CM

## 2023-10-22 DIAGNOSIS — C50912 Malignant neoplasm of unspecified site of left female breast: Secondary | ICD-10-CM | POA: Diagnosis not present

## 2023-10-22 DIAGNOSIS — Z170 Estrogen receptor positive status [ER+]: Secondary | ICD-10-CM | POA: Diagnosis not present

## 2023-10-22 NOTE — Progress Notes (Signed)
 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  INTERVAL HISTORY: Patient returns to clinic today for routine evaluation and discussion of her mammogram results.  She currently feels well and is asymptomatic. She has no neurologic complaints.  She denies any recent fevers or illnesses.  She has a good appetite and denies weight loss.  She has no chest pain, shortness of breath, cough, or hemoptysis.  She denies any nausea, vomiting, constipation, or diarrhea.  She has no urinary complaints.  Patient offers no specific complaints today.    REVIEW OF SYSTEMS:   Review of Systems  Constitutional: Negative.  Negative for fever, malaise/fatigue and weight loss.  Respiratory: Negative.  Negative for cough, hemoptysis and shortness of breath.   Cardiovascular:  Negative for chest pain and leg swelling.  Gastrointestinal: Negative.  Negative for abdominal pain.  Genitourinary: Negative.  Negative for dysuria.  Musculoskeletal: Negative.  Negative for back pain.  Skin: Negative.  Negative for rash.  Neurological: Negative.  Negative for dizziness, focal weakness, weakness and headaches.  Psychiatric/Behavioral: Negative.  The patient is not nervous/anxious.     As per HPI. Otherwise, a complete review of systems is negative.  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)    [facility]---  dx 2015,  takes methotrexate  wkly, pt stated she is stable    PAST SURGICAL HISTORY: Past Surgical History:  Procedure Laterality Date   ANTERIOR AND POSTERIOR REPAIR N/A 01/14/2021   Procedure: ANTERIOR (CYSTOCELE) AND POSTERIOR REPAIR (RECTOCELE);  Surgeon: Cyd Dowse, MD;  Location: [facility];  Service: Gynecology;  Laterality: N/A;   AUGMENTATION MAMMAPLASTY Bilateral 2010   saline implants removed and rplaced with reconstruction 08/18/2023   BREAST BIOPSY Left 10/23/2021   Stereo Bx, X-clip, Ductal carcinoma in situ   BREAST CYST EXCISION Left 03/25/2022   Procedure: EXCISION OF CHRONIC LEFT BREAST WOUND;  Surgeon: Lockie Rima, MD;  Location: [facility];  Service: General;  Laterality: Left;   BREAST ENHANCEMENT SURGERY  2009   BREAST LUMPECTOMY     BREAST LUMPECTOMY WITH RADIOACTIVE SEED AND SENTINEL LYMPH NODE BIOPSY Left 11/12/2021   Procedure: LEFT BREAST LUMPECTOMY WITH RADIOACTIVE SEED AND SENTINEL LYMPH NODE BIOPSY;  Surgeon: Lockie Rima, MD;  Location:  SURGERY CENTER;  Service: General;  Laterality: Left;   CARPAL TUNNEL RELEASE Right 2011   LAPAROSCOPIC SUPRACERVICAL HYSTERECTOMY  02/21/2010   @ [facility] by Dr Francies Ireland   NASAL SEPTUM SURGERY  01/2018   insertion button prosthesis for septal perforation  RHINOPLASTY  2005   nasal fracture    FAMILY HISTORY: Family History  Problem Relation Age of Onset   Asthma Mother    Depression Mother    Diabetes Mother    Hyperlipidemia Mother    Hypertension Mother    Mental illness Mother    Arthritis Mother    Pancreatic cancer Mother    Heart disease Father  44       AMI/CABG   Mental illness Sister    Myasthenia gravis Daughter    Charcot-Marie-Tooth disease Daughter    ADD / ADHD Daughter    Pancreatic cancer Maternal Aunt    Alzheimer's disease Maternal Grandmother    Hyperlipidemia Maternal Grandmother    Hypertension Maternal Grandmother    Heart attack Maternal Grandfather    Lung cancer Maternal Grandfather    Breast cancer Paternal Grandmother     ADVANCED DIRECTIVES (Y/N):  N  HEALTH MAINTENANCE: Social History   Tobacco Use   Smoking status: Never   Smokeless tobacco: Never  Vaping Use   Vaping status: Never Used  Substance Use Topics   Alcohol use: Yes    Comment: occasional   Drug use: Never     Colonoscopy:  PAP:  Bone density:  Lipid panel:  Allergies  Allergen Reactions   Kiwi Extract Shortness Of Breath and Swelling   Ativan  [Lorazepam ] Other (See Comments)    Hyperactivity per patient   Erythromycin Base Nausea And Vomiting   Nitrofurantoin Nausea And Vomiting   Other     Latex self dissolving sutures "work their way out"   Xanax [Alprazolam] Other (See Comments)    Makes patient hyperactive     Current Outpatient Medications  Medication Sig Dispense Refill   albuterol  (VENTOLIN  HFA) 108 (90 Base) MCG/ACT inhaler Inhale 1 puff into the lungs every 4 (four) hours as needed for shortness of breath and wheezing 6.7 g 1   folic acid  (FOLVITE ) 1 MG tablet Take 1 tablet (1 mg total) by mouth daily. 90 tablet 3   folic acid  (FOLVITE ) 1 MG tablet Take 1 tablet (1 mg total) by mouth daily. 90 tablet 3   gabapentin  (NEURONTIN ) 300 MG capsule Take 1 capsule (300 mg total) by mouth at bedtime. 90 capsule 3   hydroxychloroquine  (PLAQUENIL ) 200 MG tablet Take 1 tablet (200 mg total) by mouth 2 (two) times daily. 60 tablet 5   hydroxychloroquine  (PLAQUENIL ) 200 MG tablet Take 1 tablet (200 mg total) by mouth 2 (two) times daily. 180 tablet 1   Iron -FA-B Cmp-C-Biot-Probiotic (FUSION PLUS) CAPS Take 1 capsule by  mouth daily. 90 capsule 0   methotrexate  (RHEUMATREX) 2.5 MG tablet Take 8 tablets (20 mg total) by mouth every 7 (seven) days. 96 tablet 1   methotrexate  (RHEUMATREX) 2.5 MG tablet Take 8 tablets (20 mg total) by mouth every 7 (seven) days. 96 tablet 1   montelukast  (SINGULAIR ) 10 MG tablet Take 1 tablet (10 mg total) by mouth daily. 30 tablet 6   ondansetron  (ZOFRAN -ODT) 4 MG disintegrating tablet Dissolve 1 tablet (4 mg total) on the tongue every 6 (six) hours as needed for nausea. 8 tablet 0   pantoprazole  (PROTONIX ) 40 MG tablet Take 1 tablet (40 mg total) by mouth 2 (two) times daily. 180 tablet 1   sertraline  (ZOLOFT ) 100 MG tablet Take 1 tablet (100 mg total) by mouth daily. 90 tablet 2   traMADol  (ULTRAM ) 50 MG tablet Take 1 tablet (50 mg total) by mouth every 6 (six) hours as  needed for pain. 16 tablet 0   traZODone  (DESYREL ) 50 MG tablet Take 1 tablet (50 mg total) by mouth at bedtime as needed. 30 tablet 3   No current facility-administered medications for this visit.    OBJECTIVE: Vitals:   10/22/23 1106  BP: 129/81  Pulse: 73  Resp: 16  Temp: (!) 97.2 F (36.2 C)  SpO2: 100%       Body mass index is 29.58 kg/m.    ECOG FS:0 - Asymptomatic  General: Well-developed, well-nourished, no acute distress. Eyes: Pink conjunctiva, anicteric sclera. HEENT: Normocephalic, moist mucous membranes. Breast: Exam deferred today. Lungs: No audible wheezing or coughing. Heart: Regular rate and rhythm. Abdomen: Soft, nontender, no obvious distention. Musculoskeletal: No edema, cyanosis, or clubbing. Neuro: Alert, answering all questions appropriately. Cranial nerves grossly intact. Skin: No rashes or petechiae noted. Psych: Normal affect.   LAB RESULTS:  Lab Results  Component Value Date   NA 139 11/03/2022   K 4.4 11/03/2022   CL 105 11/03/2022   CO2 26 11/03/2022   GLUCOSE 99 11/03/2022   BUN 19 11/03/2022   CREATININE 0.66 11/03/2022   CALCIUM 9.6 11/03/2022   PROT  7.3 11/03/2022   ALBUMIN 4.1 11/03/2022   AST 21 11/03/2022   ALT 11 11/03/2022   ALKPHOS 64 11/03/2022   BILITOT 1.0 11/03/2022   GFRNONAA >60 11/03/2022   GFRAA 121 06/10/2016    Lab Results  Component Value Date   WBC 3.9 (L) 11/03/2022   NEUTROABS 2.2 11/03/2022   HGB 11.7 (L) 11/03/2022   HCT 36.0 11/03/2022   MCV 88.9 11/03/2022   PLT 241 11/03/2022     STUDIES: MM 3D DIAGNOSTIC MAMMOGRAM BILATERAL BREAST W/IMPLANT Result Date: 10/15/2023 CLINICAL DATA:  Annual examination status post LEFT breast lumpectomy in May 2023 of invasive ductal carcinoma, with negative margins and negative nodes. Postoperative course was complicated by infection in lumpectomy site, requiring excision in September 2023. Patient received adjuvant radiation therapy. She initiated letrozole  but is no longer taking it due to side effects. Most recently, patient is status post bilateral breast implant replacement and reconstruction in February 2025. EXAM: DIGITAL DIAGNOSTIC BILATERAL MAMMOGRAM WITH IMPLANTS, TOMOSYNTHESIS AND CAD TECHNIQUE: Bilateral digital diagnostic mammography and breast tomosynthesis was performed. Standard and/or implant displaced views were performed. The images were evaluated with computer-aided detection. COMPARISON:  Previous exam(s). ACR Breast Density Category c: The breasts are heterogeneously dense, which may obscure small masses. FINDINGS: The patient has retropectoral silicone implants. Bilateral interval parenchymal redistribution, consistent with interval breast reconstruction. Stable postsurgical changes in the upper-outer left breast and left axilla. No new suspicious mass, calcification, or other findings are identified in bilateral breasts. IMPRESSION: 1. No mammographic evidence of malignancy in bilateral breasts. 2. Stable post treatment changes in the left breast. 3. Sequela of interval bilateral breast implant reconstruction. RECOMMENDATION: 1. Per protocol, as the patient  is now 2 or more years status post lumpectomy, she may return to annual screening mammography in 1 year. However, given the history of breast cancer, the patient remains eligible for annual diagnostic mammography if preferred. 2. Given patient's personal history of breast cancer and breast density, consider supplemental screening with annual breast MRI. The American Cancer Society recommends annual MRI and mammography in patients with an estimated lifetime risk of developing breast cancer greater than 20%. If supplemental screening with MRI is performed, ideally it should be performed 6 months after the screening mammogram. I have discussed the findings and recommendations with the patient. If applicable,  a reminder letter will be sent to the patient regarding the next appointment. BI-RADS CATEGORY  2: Benign. Electronically Signed   By: Sande Cromer M.D.   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  Patient underwent lumpectomy on Nov 12, 2021.  She has had multiple postsurgical complications with wound infection requiring multiple procedures including hyperbaric treatment.  Patient could not tolerate letrozole , anastrozole , or tamoxifen  secondary to significant joint pain therefore all treatment has been discontinued.  Patient expressed understanding this slightly increases her risk of recurrence.  No further intervention is needed at this time.  Patient's most recent mammogram on October 15, 2023 was reported as BI-RADS 2.  Repeat in April 2026.  Patient continues to follow-up yearly with her surgeon with either mammogram or breast MRI and it was determined that if she continues these yearly visits, no further follow-up is necessary in the cancer center.  Please refer patient back if there are any questions or concerns.  Genetics: Patient was tested in January  2022 and this was reported as negative.   Bone health: Bone mineral density on June 23, 2022 was reported as normal.  Continue monitoring per primary care.  I spent a total of 20 minutes reviewing chart data, face-to-face evaluation with the patient, counseling and coordination of care as detailed above.    Patient expressed understanding and was in agreement with this plan. She also understands that She can call clinic at any time with any questions, concerns, or complaints.    Cancer Staging  Ductal carcinoma in situ (DCIS) of left breast Staging form: Breast, AJCC 8th Edition - Clinical: Stage IA (cT67mi, cN0, cM0, G1, ER+, PR+, HER2-) - Signed by Shellie Dials, MD on 11/28/2021 Histologic grading system: 3 grade system   Shellie Dials, MD   10/22/2023 11:19 AM

## 2023-10-29 DIAGNOSIS — M313 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: Secondary | ICD-10-CM | POA: Diagnosis not present

## 2023-10-29 DIAGNOSIS — Z7960 Long term (current) use of unspecified immunomodulators and immunosuppressants: Secondary | ICD-10-CM | POA: Diagnosis not present

## 2023-10-29 DIAGNOSIS — G2581 Restless legs syndrome: Secondary | ICD-10-CM | POA: Diagnosis not present

## 2023-10-29 NOTE — Progress Notes (Signed)
 Rheumatology Follow Up Note  Chief Complaint  Patient presents with  . 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 female is here today for follow up of granulomatosis with polyangiitis. The patient's allergies, current medications, past family history, past medical history, past social history, past surgical history and problem list were reviewed and updated as appropriate.   She is currently on methotrexate  10 mg weekly, Plaquenil  and folic acid  1 mg daily.   She has left ankle is much better. She is taking gabapentin  is helping the pains.   She has septal hole, this is a chronic issue. She has no neuropathy, wrist or foot drop. She has dry cough that is not new.   She has pain of her feet at the end of the day. The pain is throbbing.   Patient denies fever, weight loss, night sweats, lymphadenopathy, dry eyes/mouth, iritis/uveitis/scleritis, skin rash, nasal/oral ulcers, dyspnea, hemoptysis/recurrent sinus infection, difficulty swallowing, pleurisy, serositis, hematochezia/hematuria, seizures/stroke/DVT/PE/Raynaud's.  Review of Systems:   Review of Systems  Constitutional:  Negative for fatigue.  HENT:  Positive for hearing loss and nosebleeds. Negative for mouth sores and trouble swallowing.        Neg: Dry Mouth  Eyes:  Negative for redness.       Neg: Dry Eyes  Respiratory:  Positive for cough. Negative for shortness of breath.   Cardiovascular:  Negative for chest pain and leg swelling.  Gastrointestinal:  Negative for constipation, diarrhea and nausea.       Heartburn  Endocrine: Negative for cold intolerance and heat intolerance.  Genitourinary:  Negative for hematuria.  Musculoskeletal:        Per HPI  Skin:  Negative for color change and rash.  Neurological:  Negative for dizziness, weakness, numbness and headaches.  Hematological:  Does not bruise/bleed easily.  Psychiatric/Behavioral:  Positive for  sleep disturbance. Negative for dysphoric mood. The patient is not nervous/anxious.   All other systems reviewed and are negative.   Objective:  Vitals:   10/29/23 1024  BP: 118/70  Temp: 36.3 C (97.3 F)  TempSrc: Temporal  Weight: 74.8 kg (164 lb 14.5 oz)  Height: 160 cm (5' 3)  PainSc: 0-No pain      Length of Stiffness: One Hour   GEN - Pleasant, No Apparent Distress  HEENT - normocephalic and atraumatic. Conjunctiva Clear. No Sinus Tenderness, positive septal hole  Neck - supple with no adenopathy or thyromegaly.   C spine with full range of motion. Heart - regular rate and rhythm, No murmurs/gallops/rub, Nml S1S2 Lungs - clear to auscultation in all fields. Extremities - there is no cyanosis or edema. Neurological - alert and oriented.  Spine - no paraspinal tenderness; no L spine tenderness Skin - no rashes observed MSK - The following joints were examined bilaterally: Hands, Wrists, Elbows, Shoulders, Metatarsals, Ankels, Knees and Hips; they were normal apart from what is noted.    100% Fist Formation Mild DIP enlargement No Dactylitis Strength 5/5 Proximal and Distal, Upper and Lower Extremity No Tender Point ______________________________________________________________________ Labs/Imaging Reviewed in EMR Cr 0.7, AST 21, ALT 10 CBC with Hgb 11.3 UA: No Protein or RBCs ESR 20, CRP < 0.5 Pos: ANA 1:640 Neg: RF, AntiCCP, Smith, RNP, SSA, SSB, ANCA, 14.3.3 ETA, RF, Anti-CarP, Anti-CEP, Anti-Sa, AntiMPO, Anti-PR3   Hand and Feet Xray (02/2019): No Signs of Inflammatory Arthritis    CT Angio Chest (12/2013): 1. Bilateral breast implants are noted.  Hiatal hernia measures 6.5 cm. 2. No pulmonary embolus. 3. No acute infiltrate or pulmonary edema. 4. Mild degenerative changes thoracic spine.    Left Wrist Xray: Diffuse Soft Tissue Swelling   Assessment and Plan   1. Granulomatosis with polyangiitis: Stable  -- Diagnosed 2013. Symptoms includes nose bleed from  nasal septum perforation, hearing loss, and asthma. For nasal symptoms, she is followed by ENT for treatment options. -- Continue Methotrexate  10 mg weekly and Folic Acid  1 mg Daily -- Continue Plaquenil  200 mg twice daily    2. Positive ANA -- No sings of connective tissue disease as this time.   3. Sensorineural hearing loss -- Followed by ENT in the past    4. Long term use of immunosuppressive therapy -- Methotrexate  is an immunosuppressive medication that require drug toxicity monitoring.  -- Hydroxychloroquine  (Plaquenil ) is an immunosuppressive medication that require monitoring for eye toxicity.  -- Check labs   5. 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   All new prescription medications, changes in current prescription dosages, and sample medications were discussed with the patient, including patient education, medication name, use, dosage, potential side effects, drug interactions, consequences of not using/taking, and special instructions.  Patient expressed understanding.  No barriers to adherence.   I appreciate the opportunity to participate in the care of [name] Please do not hesitate to contact me with any questions or concerns that may arise in regards to the patient's rheumatologic disease.   I personally performed the service. (TP)  MAYUR LOREE BLANCH, MD

## 2023-11-12 ENCOUNTER — Ambulatory Visit: Admitting: Oncology

## 2023-11-17 ENCOUNTER — Other Ambulatory Visit: Payer: Self-pay

## 2023-11-17 MED ORDER — TRAZODONE HCL 50 MG PO TABS
50.0000 mg | ORAL_TABLET | Freq: Every evening | ORAL | 3 refills | Status: DC | PRN
  Filled 2023-11-17: qty 30, 30d supply, fill #0
  Filled 2024-01-02: qty 30, 30d supply, fill #1
  Filled 2024-01-30: qty 30, 30d supply, fill #2
  Filled 2024-02-21 – 2024-02-23 (×2): qty 30, 30d supply, fill #3

## 2023-12-17 ENCOUNTER — Other Ambulatory Visit: Payer: Self-pay | Admitting: General Surgery

## 2023-12-17 DIAGNOSIS — Z9882 Breast implant status: Secondary | ICD-10-CM

## 2023-12-17 DIAGNOSIS — C50412 Malignant neoplasm of upper-outer quadrant of left female breast: Secondary | ICD-10-CM

## 2024-01-02 ENCOUNTER — Other Ambulatory Visit: Payer: Self-pay

## 2024-01-05 ENCOUNTER — Ambulatory Visit
Admission: RE | Admit: 2024-01-05 | Discharge: 2024-01-05 | Disposition: A | Discharge status: Home / Self Care | Source: Ambulatory Visit | Attending: General Surgery | Admitting: General Surgery

## 2024-01-05 DIAGNOSIS — C50412 Malignant neoplasm of upper-outer quadrant of left female breast: Secondary | ICD-10-CM

## 2024-01-05 DIAGNOSIS — Z9882 Breast implant status: Secondary | ICD-10-CM | POA: Diagnosis not present

## 2024-01-05 DIAGNOSIS — Z853 Personal history of malignant neoplasm of breast: Secondary | ICD-10-CM | POA: Diagnosis not present

## 2024-01-05 DIAGNOSIS — Z08 Encounter for follow-up examination after completed treatment for malignant neoplasm: Secondary | ICD-10-CM | POA: Diagnosis not present

## 2024-01-05 MED ORDER — GADOPICLENOL 0.5 MMOL/ML IV SOLN
7.0000 mL | Freq: Once | INTRAVENOUS | Status: AC | PRN
  Administered 2024-01-05: 7 mL via INTRAVENOUS

## 2024-01-06 ENCOUNTER — Other Ambulatory Visit: Payer: Self-pay | Admitting: Medical Genetics

## 2024-01-10 ENCOUNTER — Other Ambulatory Visit
Admission: RE | Admit: 2024-01-10 | Discharge: 2024-01-10 | Disposition: A | Discharge status: Home / Self Care | Payer: Self-pay | Source: Ambulatory Visit | Attending: Medical Genetics | Admitting: Medical Genetics

## 2024-01-11 ENCOUNTER — Ambulatory Visit: Payer: Self-pay | Admitting: General Surgery

## 2024-01-21 LAB — GENECONNECT MOLECULAR SCREEN: Genetic Analysis Overall Interpretation: NEGATIVE

## 2024-01-26 ENCOUNTER — Other Ambulatory Visit: Payer: Self-pay

## 2024-01-26 ENCOUNTER — Telehealth: Admitting: Physician Assistant

## 2024-01-26 DIAGNOSIS — W57XXXA Bitten or stung by nonvenomous insect and other nonvenomous arthropods, initial encounter: Secondary | ICD-10-CM

## 2024-01-26 DIAGNOSIS — S40862A Insect bite (nonvenomous) of left upper arm, initial encounter: Secondary | ICD-10-CM

## 2024-01-26 DIAGNOSIS — Z9189 Other specified personal risk factors, not elsewhere classified: Secondary | ICD-10-CM

## 2024-01-26 DIAGNOSIS — R519 Headache, unspecified: Secondary | ICD-10-CM | POA: Diagnosis not present

## 2024-01-26 DIAGNOSIS — L539 Erythematous condition, unspecified: Secondary | ICD-10-CM | POA: Diagnosis not present

## 2024-01-26 MED ORDER — DOXYCYCLINE HYCLATE 100 MG PO TABS
100.0000 mg | ORAL_TABLET | Freq: Two times a day (BID) | ORAL | 0 refills | Status: AC
Start: 2024-01-26 — End: 2024-02-16
  Filled 2024-01-26: qty 42, 21d supply, fill #0

## 2024-01-26 NOTE — Progress Notes (Signed)
 E-Visit for Tick Bite  Thank you for describing your tick bite, Here is how we plan to help! Based on the information that you shared with me it looks like you have An infected or complicated tick bite that requires a longer course of antibiotics and will need for you to schedule a follow-up visit with a provider.  In most cases a tick bite is painless and does not itch.  Most tick bites in which the tick is quickly removed do not require prescriptions. Ticks can transmit several diseases if they are infected and remain attacked to your skin. Therefore the length that the tick was attached and any symptoms you have experienced after the bite are import to accurately develop your custom treatment plan. In most cases a single dose of doxycycline  may prevent the development of a more serious condition.  Based on your information I have Provided a home care guide for tick bites and  instructions on when to call for help. and Your symptoms indicate that you need a longer course of antibiotics and a follow up visit with a provider. I have sent doxycycline  100 mg twice a day for 21 days to the pharmacy that you selected. You will need to schedule a follow up visit with your provider.  Which ticks  are associated with illness?  The Wood Tick (dog tick) is the size of a watermelon seed and can sometimes transmit [facility] spotted fever and [place]  tick fever.   The Deer Tick (black-legged tick) is between the size of a poppy seed (pin head) and an apple seed, and can sometimes transmit Lyme disease.  A brown to black tick with a white splotch on its back is likely a female Amblyomma americanum ([place] tick). This tick has been associated with Southern Tick Associated illness ( STARI)  Lyme disease has become the most common tick-borne illness in the [place] . The risk of Lyme disease following a recognized deer tick bite is estimated to be 1%.  The majority of cases of Lyme disease start  with a bull's eye rash at the site of the tick bite. The rash can occur days to weeks (typically 7-10 days) after a tick bite. Treatment with antibiotics is indicated if this rash appears. Flu-like symptoms may accompany the rash, including: fever, chills, headaches, muscle aches, and fatigue. Removing ticks promptly may prevent tick borne disease.  What can be used to prevent Tick Bites?  Insect repellant with at leas 20% DEET. Wearing long pants with sock and shoes. Avoiding tall grass and heavily wooded areas. Checking your skin after being outdoors. Shower with a washcloth after outdoor exposures.  HOME CARE ADVICE FOR TICK BITE  Wood Tick Removal:  Use a pair of tweezers and grasp the wood tick close to the skin (on its head). Pull the wood tick straight upward without twisting or crushing it. Maintain a steady pressure until it releases its grip.   If tweezers aren't available, use fingers, a loop of thread around the jaws, or a needle between the jaws for traction.  Note: covering the tick with petroleum jelly, nail polish or rubbing alcohol doesn't work. Neither does touching the tick with a hot or cold object. Tiny Deer Tick Removal:   Needs to be scraped off with a knife blade or credit card edge. Place tick in a sealed container (e.g. glass jar, zip lock plastic bag), in case your doctor wants to see it. Tick's Head Removal:  If the wood tick's  head breaks off in the skin, it must be removed. Clean the skin. Then use a sterile needle to uncover the head and lift it out or scrape it off.  If a very small piece of the head remains, the skin will eventually slough it off. Antibiotic Ointment:  Wash the wound and your hands with soap and water after removal to prevent catching any tick disease.  Apply an over the counter antibiotic ointment (e.g. bacitracin) to the bite once. Expected Course: Tick bites normally don't itch or hurt. That's why they often go unnoticed. Call Your Doctor  If:  You can't remove the tick or the tick's head Fever, a severe head ache, or rash occur in the next 2 weeks Bite begins to look infected Lyme's disease is common in your area You have not had a tetanus in the last 10 years Your current symptoms become worse    MAKE SURE YOU  Understand these instructions. Will watch your condition. Will get help right away if you are not doing well or get worse.    Thank you for choosing an e-visit.  Your e-visit answers were reviewed by a board certified advanced clinical practitioner to complete your personal care plan. Depending upon the condition, your plan could have included both over the counter or prescription medications.  Please review your pharmacy choice. Make sure the pharmacy is open so you can pick up prescription now. If there is a problem, you may contact your provider through [employer] and have the prescription routed to another pharmacy.  Your safety is important to us . If you have drug allergies check your prescription carefully.   For the next 24 hours you can use MyChart to ask questions about today's visit, request a non-urgent call back, or ask for a work or school excuse. You will get an email in the next two days asking about your experience. I hope that your e-visit has been valuable and will speed your recovery.    I have spent 5 minutes in review of e-visit questionnaire, review and updating patient chart, medical decision making and response to patient.   Delon CHRISTELLA Dickinson, PA-C

## 2024-01-27 ENCOUNTER — Other Ambulatory Visit: Payer: Self-pay

## 2024-01-27 DIAGNOSIS — S40862D Insect bite (nonvenomous) of left upper arm, subsequent encounter: Secondary | ICD-10-CM | POA: Diagnosis not present

## 2024-01-27 DIAGNOSIS — W57XXXD Bitten or stung by nonvenomous insect and other nonvenomous arthropods, subsequent encounter: Secondary | ICD-10-CM | POA: Diagnosis not present

## 2024-01-27 DIAGNOSIS — K219 Gastro-esophageal reflux disease without esophagitis: Secondary | ICD-10-CM | POA: Diagnosis not present

## 2024-01-27 DIAGNOSIS — K449 Diaphragmatic hernia without obstruction or gangrene: Secondary | ICD-10-CM | POA: Diagnosis not present

## 2024-01-27 DIAGNOSIS — M313 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: Secondary | ICD-10-CM | POA: Diagnosis not present

## 2024-01-27 MED ORDER — FOLIC ACID 1 MG PO TABS
1.0000 mg | ORAL_TABLET | Freq: Every day | ORAL | 1 refills | Status: AC
  Filled 2024-01-27 – 2024-02-21 (×2): qty 90, 90d supply, fill #0
  Filled 2024-06-04: qty 90, 90d supply, fill #1

## 2024-02-07 ENCOUNTER — Other Ambulatory Visit: Payer: Self-pay

## 2024-02-07 MED ORDER — VOQUEZNA 10 MG PO TABS
10.0000 mg | ORAL_TABLET | Freq: Every day | ORAL | 0 refills | Status: DC
  Filled 2024-02-07 – 2024-02-11 (×4): qty 30, 30d supply, fill #0

## 2024-02-09 ENCOUNTER — Other Ambulatory Visit: Payer: Self-pay

## 2024-02-11 ENCOUNTER — Other Ambulatory Visit: Payer: Self-pay

## 2024-02-21 ENCOUNTER — Other Ambulatory Visit: Payer: Self-pay

## 2024-02-23 ENCOUNTER — Other Ambulatory Visit: Payer: Self-pay

## 2024-03-01 ENCOUNTER — Other Ambulatory Visit: Payer: Self-pay

## 2024-03-22 ENCOUNTER — Other Ambulatory Visit: Payer: Self-pay

## 2024-03-22 ENCOUNTER — Other Ambulatory Visit (HOSPITAL_COMMUNITY): Payer: Self-pay

## 2024-03-22 DIAGNOSIS — R7303 Prediabetes: Secondary | ICD-10-CM | POA: Diagnosis not present

## 2024-03-22 DIAGNOSIS — D0512 Intraductal carcinoma in situ of left breast: Secondary | ICD-10-CM | POA: Diagnosis not present

## 2024-03-22 DIAGNOSIS — D638 Anemia in other chronic diseases classified elsewhere: Secondary | ICD-10-CM | POA: Diagnosis not present

## 2024-03-22 DIAGNOSIS — E559 Vitamin D deficiency, unspecified: Secondary | ICD-10-CM | POA: Diagnosis not present

## 2024-03-22 DIAGNOSIS — F419 Anxiety disorder, unspecified: Secondary | ICD-10-CM | POA: Diagnosis not present

## 2024-03-22 DIAGNOSIS — Z0000 Encounter for general adult medical examination without abnormal findings: Secondary | ICD-10-CM | POA: Diagnosis not present

## 2024-03-22 DIAGNOSIS — E7800 Pure hypercholesterolemia, unspecified: Secondary | ICD-10-CM | POA: Diagnosis not present

## 2024-03-22 DIAGNOSIS — M313 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: Secondary | ICD-10-CM | POA: Diagnosis not present

## 2024-03-22 DIAGNOSIS — D509 Iron deficiency anemia, unspecified: Secondary | ICD-10-CM | POA: Diagnosis not present

## 2024-03-22 DIAGNOSIS — G629 Polyneuropathy, unspecified: Secondary | ICD-10-CM | POA: Diagnosis not present

## 2024-03-22 DIAGNOSIS — E669 Obesity, unspecified: Secondary | ICD-10-CM | POA: Diagnosis not present

## 2024-03-22 MED ORDER — MONTELUKAST SODIUM 10 MG PO TABS
10.0000 mg | ORAL_TABLET | Freq: Every day | ORAL | 6 refills | Status: AC
  Filled 2024-03-22 (×2): qty 30, 30d supply, fill #0
  Filled 2024-05-01: qty 30, 30d supply, fill #1
  Filled 2024-06-04: qty 30, 30d supply, fill #2
  Filled 2024-06-18 – 2024-06-28 (×3): qty 30, 30d supply, fill #3
  Filled 2024-07-06 – 2024-07-24 (×2): qty 30, 30d supply, fill #4

## 2024-03-23 ENCOUNTER — Other Ambulatory Visit: Payer: Self-pay

## 2024-03-23 MED ORDER — VOQUEZNA 10 MG PO TABS
10.0000 mg | ORAL_TABLET | Freq: Every day | ORAL | 0 refills | Status: DC
  Filled 2024-03-23 – 2024-03-29 (×4): qty 30, 30d supply, fill #0

## 2024-03-27 ENCOUNTER — Other Ambulatory Visit: Payer: Self-pay

## 2024-03-28 ENCOUNTER — Other Ambulatory Visit: Payer: Self-pay

## 2024-03-28 MED ORDER — TRAZODONE HCL 50 MG PO TABS
50.0000 mg | ORAL_TABLET | Freq: Every evening | ORAL | 3 refills | Status: DC | PRN
  Filled 2024-03-28: qty 30, 30d supply, fill #0
  Filled 2024-05-02: qty 30, 30d supply, fill #1
  Filled 2024-06-04: qty 30, 30d supply, fill #2
  Filled 2024-07-06: qty 30, 30d supply, fill #3

## 2024-03-29 ENCOUNTER — Other Ambulatory Visit: Payer: Self-pay

## 2024-03-30 ENCOUNTER — Other Ambulatory Visit: Payer: Self-pay

## 2024-03-31 ENCOUNTER — Other Ambulatory Visit: Payer: Self-pay

## 2024-04-02 ENCOUNTER — Other Ambulatory Visit: Payer: Self-pay

## 2024-04-03 ENCOUNTER — Other Ambulatory Visit: Payer: Self-pay

## 2024-04-05 ENCOUNTER — Other Ambulatory Visit: Payer: Self-pay

## 2024-04-06 ENCOUNTER — Other Ambulatory Visit: Payer: Self-pay

## 2024-04-07 ENCOUNTER — Other Ambulatory Visit: Payer: Self-pay

## 2024-04-10 ENCOUNTER — Other Ambulatory Visit: Payer: Self-pay

## 2024-04-11 ENCOUNTER — Other Ambulatory Visit: Payer: Self-pay

## 2024-04-11 DIAGNOSIS — G2581 Restless legs syndrome: Secondary | ICD-10-CM | POA: Diagnosis not present

## 2024-04-11 DIAGNOSIS — G4719 Other hypersomnia: Secondary | ICD-10-CM | POA: Diagnosis not present

## 2024-04-11 DIAGNOSIS — R0683 Snoring: Secondary | ICD-10-CM | POA: Diagnosis not present

## 2024-04-11 DIAGNOSIS — F5104 Psychophysiologic insomnia: Secondary | ICD-10-CM | POA: Diagnosis not present

## 2024-04-13 ENCOUNTER — Other Ambulatory Visit: Payer: Self-pay

## 2024-04-14 ENCOUNTER — Other Ambulatory Visit: Payer: Self-pay

## 2024-04-17 ENCOUNTER — Other Ambulatory Visit: Payer: Self-pay

## 2024-05-01 ENCOUNTER — Other Ambulatory Visit: Payer: Self-pay

## 2024-05-02 ENCOUNTER — Other Ambulatory Visit: Payer: Self-pay

## 2024-05-02 DIAGNOSIS — K219 Gastro-esophageal reflux disease without esophagitis: Secondary | ICD-10-CM | POA: Diagnosis not present

## 2024-05-02 DIAGNOSIS — Z860100 Personal history of colon polyps, unspecified: Secondary | ICD-10-CM | POA: Diagnosis not present

## 2024-05-02 MED ORDER — VOQUEZNA 10 MG PO TABS
10.0000 mg | ORAL_TABLET | Freq: Every day | ORAL | 0 refills | Status: DC
  Filled 2024-05-02 – 2024-05-03 (×4): qty 30, 30d supply, fill #0

## 2024-05-03 ENCOUNTER — Other Ambulatory Visit: Payer: Self-pay

## 2024-05-04 ENCOUNTER — Other Ambulatory Visit: Payer: Self-pay

## 2024-05-05 ENCOUNTER — Other Ambulatory Visit: Payer: Self-pay

## 2024-05-08 ENCOUNTER — Other Ambulatory Visit: Payer: Self-pay

## 2024-05-08 DIAGNOSIS — Z170 Estrogen receptor positive status [ER+]: Secondary | ICD-10-CM | POA: Diagnosis not present

## 2024-05-08 DIAGNOSIS — C50412 Malignant neoplasm of upper-outer quadrant of left female breast: Secondary | ICD-10-CM | POA: Diagnosis not present

## 2024-05-08 DIAGNOSIS — Z800 Family history of malignant neoplasm of digestive organs: Secondary | ICD-10-CM | POA: Diagnosis not present

## 2024-05-08 DIAGNOSIS — Z9882 Breast implant status: Secondary | ICD-10-CM | POA: Diagnosis not present

## 2024-05-08 DIAGNOSIS — Z7960 Long term (current) use of unspecified immunomodulators and immunosuppressants: Secondary | ICD-10-CM | POA: Diagnosis not present

## 2024-05-09 ENCOUNTER — Telehealth: Admitting: Physician Assistant

## 2024-05-09 ENCOUNTER — Other Ambulatory Visit: Payer: Self-pay | Admitting: General Surgery

## 2024-05-09 ENCOUNTER — Other Ambulatory Visit (HOSPITAL_BASED_OUTPATIENT_CLINIC_OR_DEPARTMENT_OTHER): Payer: Self-pay

## 2024-05-09 DIAGNOSIS — Z800 Family history of malignant neoplasm of digestive organs: Secondary | ICD-10-CM

## 2024-05-09 DIAGNOSIS — J020 Streptococcal pharyngitis: Secondary | ICD-10-CM | POA: Diagnosis not present

## 2024-05-09 MED ORDER — AMOXICILLIN 875 MG PO TABS
875.0000 mg | ORAL_TABLET | Freq: Two times a day (BID) | ORAL | 0 refills | Status: AC
  Filled 2024-05-09: qty 20, 10d supply, fill #0

## 2024-05-09 NOTE — Progress Notes (Signed)

## 2024-05-10 ENCOUNTER — Other Ambulatory Visit: Payer: Self-pay

## 2024-05-10 DIAGNOSIS — G4733 Obstructive sleep apnea (adult) (pediatric): Secondary | ICD-10-CM | POA: Diagnosis not present

## 2024-05-11 ENCOUNTER — Other Ambulatory Visit: Payer: Self-pay

## 2024-05-18 ENCOUNTER — Other Ambulatory Visit (HOSPITAL_COMMUNITY): Payer: Self-pay

## 2024-05-18 DIAGNOSIS — R509 Fever, unspecified: Secondary | ICD-10-CM | POA: Diagnosis not present

## 2024-05-18 DIAGNOSIS — Z0184 Encounter for antibody response examination: Secondary | ICD-10-CM | POA: Diagnosis not present

## 2024-05-18 DIAGNOSIS — J988 Other specified respiratory disorders: Secondary | ICD-10-CM | POA: Diagnosis not present

## 2024-05-18 DIAGNOSIS — H9203 Otalgia, bilateral: Secondary | ICD-10-CM | POA: Diagnosis not present

## 2024-05-18 DIAGNOSIS — R059 Cough, unspecified: Secondary | ICD-10-CM | POA: Diagnosis not present

## 2024-05-18 DIAGNOSIS — R519 Headache, unspecified: Secondary | ICD-10-CM | POA: Diagnosis not present

## 2024-05-18 DIAGNOSIS — R52 Pain, unspecified: Secondary | ICD-10-CM | POA: Diagnosis not present

## 2024-05-18 MED ORDER — OSELTAMIVIR PHOSPHATE 75 MG PO CAPS
75.0000 mg | ORAL_CAPSULE | Freq: Two times a day (BID) | ORAL | 0 refills | Status: AC
  Filled 2024-05-18: qty 10, 5d supply, fill #0

## 2024-05-18 MED ORDER — AZITHROMYCIN 250 MG PO TABS
ORAL_TABLET | ORAL | 0 refills | Status: AC
  Filled 2024-05-18: qty 6, 5d supply, fill #0

## 2024-05-18 MED ORDER — BENZONATATE 100 MG PO CAPS
100.0000 mg | ORAL_CAPSULE | Freq: Three times a day (TID) | ORAL | 0 refills | Status: AC | PRN
  Filled 2024-05-18: qty 60, 20d supply, fill #0

## 2024-05-18 MED ORDER — GUAIFENESIN-CODEINE 100-10 MG/5ML PO SOLN
10.0000 mL | ORAL | 0 refills | Status: AC | PRN
  Filled 2024-05-18: qty 120, 2d supply, fill #0

## 2024-05-22 ENCOUNTER — Inpatient Hospital Stay
Admission: RE | Admit: 2024-05-22 | Discharge: 2024-05-22 | Disposition: A | Source: Ambulatory Visit | Attending: General Surgery | Admitting: General Surgery

## 2024-05-22 DIAGNOSIS — Z800 Family history of malignant neoplasm of digestive organs: Secondary | ICD-10-CM

## 2024-05-22 DIAGNOSIS — K449 Diaphragmatic hernia without obstruction or gangrene: Secondary | ICD-10-CM | POA: Diagnosis not present

## 2024-05-22 DIAGNOSIS — K7689 Other specified diseases of liver: Secondary | ICD-10-CM | POA: Diagnosis not present

## 2024-05-22 MED ORDER — GADOPICLENOL 0.5 MMOL/ML IV SOLN
7.5000 mL | Freq: Once | INTRAVENOUS | Status: AC | PRN
  Administered 2024-05-22: 7.5 mL via INTRAVENOUS

## 2024-05-24 ENCOUNTER — Other Ambulatory Visit: Payer: Self-pay

## 2024-05-25 ENCOUNTER — Ambulatory Visit: Payer: Self-pay | Admitting: General Surgery

## 2024-06-02 ENCOUNTER — Other Ambulatory Visit (HOSPITAL_BASED_OUTPATIENT_CLINIC_OR_DEPARTMENT_OTHER): Payer: Self-pay

## 2024-06-02 MED ORDER — PEG 3350-KCL-NABCB-NACL-NASULF 236 G PO SOLR
ORAL | 0 refills | Status: AC
Start: 2024-05-02 — End: ?
  Filled 2024-06-02: qty 4000, 1d supply, fill #0

## 2024-06-02 MED ORDER — DULCOLAX 5 MG PO TBEC
DELAYED_RELEASE_TABLET | ORAL | 0 refills | Status: AC
Start: 2024-05-02 — End: ?
  Filled 2024-06-02: qty 4, 1d supply, fill #0

## 2024-06-04 ENCOUNTER — Other Ambulatory Visit (HOSPITAL_BASED_OUTPATIENT_CLINIC_OR_DEPARTMENT_OTHER): Payer: Self-pay

## 2024-06-05 ENCOUNTER — Other Ambulatory Visit (HOSPITAL_BASED_OUTPATIENT_CLINIC_OR_DEPARTMENT_OTHER): Payer: Self-pay

## 2024-06-05 ENCOUNTER — Other Ambulatory Visit: Payer: Self-pay

## 2024-06-05 MED ORDER — VOQUEZNA 10 MG PO TABS
10.0000 mg | ORAL_TABLET | Freq: Every day | ORAL | 0 refills | Status: DC
  Filled 2024-06-05: qty 30, 30d supply, fill #0

## 2024-06-06 DIAGNOSIS — D123 Benign neoplasm of transverse colon: Secondary | ICD-10-CM | POA: Diagnosis not present

## 2024-06-06 DIAGNOSIS — Z860101 Personal history of adenomatous and serrated colon polyps: Secondary | ICD-10-CM | POA: Diagnosis not present

## 2024-06-06 DIAGNOSIS — D128 Benign neoplasm of rectum: Secondary | ICD-10-CM | POA: Diagnosis not present

## 2024-06-06 DIAGNOSIS — Z09 Encounter for follow-up examination after completed treatment for conditions other than malignant neoplasm: Secondary | ICD-10-CM | POA: Diagnosis not present

## 2024-06-13 ENCOUNTER — Other Ambulatory Visit: Payer: Self-pay

## 2024-06-14 ENCOUNTER — Other Ambulatory Visit: Payer: Self-pay

## 2024-06-14 DIAGNOSIS — G4734 Idiopathic sleep related nonobstructive alveolar hypoventilation: Secondary | ICD-10-CM | POA: Diagnosis not present

## 2024-06-14 DIAGNOSIS — G4733 Obstructive sleep apnea (adult) (pediatric): Secondary | ICD-10-CM | POA: Diagnosis not present

## 2024-06-14 MED ORDER — ZEPBOUND 2.5 MG/0.5ML ~~LOC~~ SOAJ
2.5000 mg | SUBCUTANEOUS | 0 refills | Status: AC
  Filled 2024-06-14: qty 2, 28d supply, fill #0

## 2024-06-18 ENCOUNTER — Other Ambulatory Visit (HOSPITAL_BASED_OUTPATIENT_CLINIC_OR_DEPARTMENT_OTHER): Payer: Self-pay

## 2024-06-18 ENCOUNTER — Other Ambulatory Visit (HOSPITAL_COMMUNITY): Payer: Self-pay

## 2024-06-19 ENCOUNTER — Other Ambulatory Visit: Payer: Self-pay

## 2024-06-19 ENCOUNTER — Other Ambulatory Visit (HOSPITAL_COMMUNITY): Payer: Self-pay

## 2024-06-19 ENCOUNTER — Encounter (HOSPITAL_COMMUNITY): Payer: Self-pay

## 2024-06-19 ENCOUNTER — Other Ambulatory Visit (HOSPITAL_BASED_OUTPATIENT_CLINIC_OR_DEPARTMENT_OTHER): Payer: Self-pay

## 2024-06-19 MED ORDER — VOQUEZNA 10 MG PO TABS
10.0000 mg | ORAL_TABLET | Freq: Every day | ORAL | 0 refills | Status: AC
  Filled 2024-07-06 – 2024-07-07 (×2): qty 30, 30d supply, fill #0

## 2024-06-26 ENCOUNTER — Other Ambulatory Visit: Payer: Self-pay

## 2024-06-26 MED ORDER — GABAPENTIN 300 MG PO CAPS
300.0000 mg | ORAL_CAPSULE | Freq: Every day | ORAL | 0 refills | Status: DC
  Filled 2024-06-26 – 2024-06-27 (×2): qty 30, 30d supply, fill #0

## 2024-06-27 ENCOUNTER — Other Ambulatory Visit (HOSPITAL_BASED_OUTPATIENT_CLINIC_OR_DEPARTMENT_OTHER): Payer: Self-pay

## 2024-06-27 ENCOUNTER — Other Ambulatory Visit (HOSPITAL_COMMUNITY): Payer: Self-pay

## 2024-06-27 ENCOUNTER — Other Ambulatory Visit: Payer: Self-pay

## 2024-06-28 ENCOUNTER — Other Ambulatory Visit: Payer: Self-pay

## 2024-06-28 ENCOUNTER — Encounter (HOSPITAL_BASED_OUTPATIENT_CLINIC_OR_DEPARTMENT_OTHER): Payer: Self-pay

## 2024-06-28 ENCOUNTER — Other Ambulatory Visit (HOSPITAL_BASED_OUTPATIENT_CLINIC_OR_DEPARTMENT_OTHER): Payer: Self-pay

## 2024-07-06 ENCOUNTER — Encounter (HOSPITAL_COMMUNITY): Payer: Self-pay | Admitting: Pharmacist

## 2024-07-06 ENCOUNTER — Other Ambulatory Visit (HOSPITAL_BASED_OUTPATIENT_CLINIC_OR_DEPARTMENT_OTHER): Payer: Self-pay

## 2024-07-07 ENCOUNTER — Other Ambulatory Visit: Payer: Self-pay

## 2024-07-07 ENCOUNTER — Other Ambulatory Visit (HOSPITAL_BASED_OUTPATIENT_CLINIC_OR_DEPARTMENT_OTHER): Payer: Self-pay

## 2024-07-10 ENCOUNTER — Other Ambulatory Visit (HOSPITAL_COMMUNITY): Payer: Self-pay

## 2024-07-10 ENCOUNTER — Encounter (HOSPITAL_COMMUNITY): Payer: Self-pay | Admitting: Pharmacist

## 2024-07-10 MED ORDER — HYDROXYCHLOROQUINE SULFATE 200 MG PO TABS
200.0000 mg | ORAL_TABLET | Freq: Two times a day (BID) | ORAL | 1 refills | Status: AC
  Filled 2024-07-10 – 2024-07-11 (×3): qty 180, 90d supply, fill #0

## 2024-07-10 MED ORDER — GABAPENTIN 100 MG PO CAPS
100.0000 mg | ORAL_CAPSULE | Freq: Every day | ORAL | 5 refills | Status: AC
  Filled 2024-07-10 – 2024-07-11 (×3): qty 30, 30d supply, fill #0
  Filled 2024-07-27 – 2024-08-09 (×3): qty 30, 30d supply, fill #1

## 2024-07-10 MED ORDER — METHOTREXATE SODIUM 2.5 MG PO TABS
20.0000 mg | ORAL_TABLET | ORAL | 1 refills | Status: AC
  Filled 2024-07-10 – 2024-07-11 (×2): qty 96, 84d supply, fill #0

## 2024-07-11 ENCOUNTER — Other Ambulatory Visit (HOSPITAL_COMMUNITY): Payer: Self-pay

## 2024-07-11 ENCOUNTER — Other Ambulatory Visit (HOSPITAL_BASED_OUTPATIENT_CLINIC_OR_DEPARTMENT_OTHER): Payer: Self-pay

## 2024-07-18 ENCOUNTER — Other Ambulatory Visit: Payer: Self-pay | Admitting: Oncology

## 2024-07-18 DIAGNOSIS — C50919 Malignant neoplasm of unspecified site of unspecified female breast: Secondary | ICD-10-CM

## 2024-07-18 DIAGNOSIS — D0512 Intraductal carcinoma in situ of left breast: Secondary | ICD-10-CM

## 2024-07-24 ENCOUNTER — Other Ambulatory Visit (HOSPITAL_COMMUNITY): Payer: Self-pay

## 2024-07-28 ENCOUNTER — Other Ambulatory Visit (HOSPITAL_COMMUNITY): Payer: Self-pay

## 2024-07-28 ENCOUNTER — Other Ambulatory Visit: Payer: Self-pay

## 2024-08-01 ENCOUNTER — Other Ambulatory Visit (HOSPITAL_COMMUNITY): Payer: Self-pay

## 2024-08-02 ENCOUNTER — Other Ambulatory Visit (HOSPITAL_COMMUNITY): Payer: Self-pay

## 2024-08-02 ENCOUNTER — Other Ambulatory Visit: Payer: Self-pay

## 2024-08-02 MED ORDER — TRAZODONE HCL 50 MG PO TABS
50.0000 mg | ORAL_TABLET | Freq: Every evening | ORAL | 3 refills | Status: AC | PRN
  Filled 2024-08-02: qty 90, 90d supply, fill #0

## 2024-08-04 ENCOUNTER — Other Ambulatory Visit (HOSPITAL_COMMUNITY): Payer: Self-pay

## 2024-08-04 ENCOUNTER — Other Ambulatory Visit: Payer: Self-pay

## 2024-08-04 MED ORDER — GABAPENTIN 300 MG PO CAPS
300.0000 mg | ORAL_CAPSULE | Freq: Every day | ORAL | 0 refills | Status: AC
  Filled 2024-08-04 (×2): qty 30, 30d supply, fill #0

## 2024-08-04 MED ORDER — GABAPENTIN 100 MG PO CAPS
100.0000 mg | ORAL_CAPSULE | Freq: Every day | ORAL | 5 refills | Status: AC
  Filled 2024-08-04: qty 30, 30d supply, fill #0

## 2024-08-05 ENCOUNTER — Other Ambulatory Visit (HOSPITAL_COMMUNITY): Payer: Self-pay

## 2024-10-16 ENCOUNTER — Encounter
